# Patient Record
Sex: Female | Born: 1940 | Race: White | Hispanic: No | Smoking: Former smoker
Health system: Southern US, Community
[De-identification: ages and names within clinical notes are randomized; demographics above are authoritative.]

## PROBLEM LIST (undated history)

## (undated) DIAGNOSIS — Z923 Personal history of irradiation: Secondary | ICD-10-CM

## (undated) DIAGNOSIS — D649 Anemia, unspecified: Secondary | ICD-10-CM

## (undated) DIAGNOSIS — H9190 Unspecified hearing loss, unspecified ear: Secondary | ICD-10-CM

## (undated) DIAGNOSIS — M1990 Unspecified osteoarthritis, unspecified site: Secondary | ICD-10-CM

## (undated) DIAGNOSIS — M8580 Other specified disorders of bone density and structure, unspecified site: Secondary | ICD-10-CM

## (undated) DIAGNOSIS — Z860100 Personal history of colon polyps, unspecified: Secondary | ICD-10-CM

## (undated) DIAGNOSIS — K219 Gastro-esophageal reflux disease without esophagitis: Secondary | ICD-10-CM

## (undated) DIAGNOSIS — C50919 Malignant neoplasm of unspecified site of unspecified female breast: Secondary | ICD-10-CM

## (undated) DIAGNOSIS — F32A Depression, unspecified: Secondary | ICD-10-CM

## (undated) DIAGNOSIS — F419 Anxiety disorder, unspecified: Secondary | ICD-10-CM

## (undated) DIAGNOSIS — M810 Age-related osteoporosis without current pathological fracture: Secondary | ICD-10-CM

## (undated) DIAGNOSIS — Z8701 Personal history of pneumonia (recurrent): Secondary | ICD-10-CM

## (undated) DIAGNOSIS — K5790 Diverticulosis of intestine, part unspecified, without perforation or abscess without bleeding: Secondary | ICD-10-CM

## (undated) DIAGNOSIS — J45909 Unspecified asthma, uncomplicated: Secondary | ICD-10-CM

## (undated) DIAGNOSIS — Z9289 Personal history of other medical treatment: Secondary | ICD-10-CM

## (undated) DIAGNOSIS — G8929 Other chronic pain: Secondary | ICD-10-CM

## (undated) DIAGNOSIS — Z8719 Personal history of other diseases of the digestive system: Secondary | ICD-10-CM

## (undated) DIAGNOSIS — I10 Essential (primary) hypertension: Secondary | ICD-10-CM

## (undated) DIAGNOSIS — R51 Headache: Secondary | ICD-10-CM

## (undated) DIAGNOSIS — F329 Major depressive disorder, single episode, unspecified: Secondary | ICD-10-CM

## (undated) DIAGNOSIS — G4700 Insomnia, unspecified: Secondary | ICD-10-CM

## (undated) DIAGNOSIS — M549 Dorsalgia, unspecified: Secondary | ICD-10-CM

## (undated) DIAGNOSIS — R519 Headache, unspecified: Secondary | ICD-10-CM

## (undated) DIAGNOSIS — R9389 Abnormal findings on diagnostic imaging of other specified body structures: Principal | ICD-10-CM

## (undated) HISTORY — DX: Other specified disorders of bone density and structure, unspecified site: M85.80

## (undated) HISTORY — PX: HIP FRACTURE SURGERY: SHX118

## (undated) HISTORY — DX: Depression, unspecified: F32.A

## (undated) HISTORY — DX: Essential (primary) hypertension: I10

## (undated) HISTORY — DX: Personal history of pneumonia (recurrent): Z87.01

## (undated) HISTORY — DX: Abnormal findings on diagnostic imaging of other specified body structures: R93.89

## (undated) HISTORY — DX: Anemia, unspecified: D64.9

## (undated) HISTORY — PX: COLONOSCOPY: SHX174

## (undated) HISTORY — DX: Major depressive disorder, single episode, unspecified: F32.9

## (undated) HISTORY — DX: Unspecified asthma, uncomplicated: J45.909

---

## 2001-10-31 ENCOUNTER — Encounter: Payer: Self-pay | Admitting: Cardiology

## 2001-10-31 ENCOUNTER — Ambulatory Visit (HOSPITAL_COMMUNITY): Admission: RE | Admit: 2001-10-31 | Discharge: 2001-10-31 | Payer: Self-pay | Admitting: Cardiology

## 2002-12-19 ENCOUNTER — Encounter: Payer: Self-pay | Admitting: Family Medicine

## 2002-12-19 ENCOUNTER — Ambulatory Visit (HOSPITAL_COMMUNITY): Admission: RE | Admit: 2002-12-19 | Discharge: 2002-12-19 | Payer: Self-pay | Admitting: Family Medicine

## 2004-02-28 ENCOUNTER — Ambulatory Visit (HOSPITAL_COMMUNITY): Admission: RE | Admit: 2004-02-28 | Discharge: 2004-02-28 | Payer: Self-pay | Admitting: Family Medicine

## 2005-03-02 ENCOUNTER — Ambulatory Visit (HOSPITAL_COMMUNITY): Admission: RE | Admit: 2005-03-02 | Discharge: 2005-03-02 | Payer: Self-pay | Admitting: Family Medicine

## 2005-11-09 ENCOUNTER — Ambulatory Visit (HOSPITAL_COMMUNITY): Admission: RE | Admit: 2005-11-09 | Discharge: 2005-11-09 | Payer: Self-pay | Admitting: Family Medicine

## 2006-06-17 ENCOUNTER — Ambulatory Visit: Payer: Self-pay | Admitting: Internal Medicine

## 2006-07-06 ENCOUNTER — Encounter (INDEPENDENT_AMBULATORY_CARE_PROVIDER_SITE_OTHER): Payer: Self-pay | Admitting: Specialist

## 2006-07-06 ENCOUNTER — Ambulatory Visit: Payer: Self-pay | Admitting: Internal Medicine

## 2006-07-06 ENCOUNTER — Ambulatory Visit (HOSPITAL_COMMUNITY): Admission: RE | Admit: 2006-07-06 | Discharge: 2006-07-06 | Payer: Self-pay | Admitting: Internal Medicine

## 2007-01-01 ENCOUNTER — Emergency Department (HOSPITAL_COMMUNITY): Admission: EM | Admit: 2007-01-01 | Discharge: 2007-01-02 | Payer: Self-pay | Admitting: Emergency Medicine

## 2007-01-05 ENCOUNTER — Encounter (HOSPITAL_COMMUNITY): Admission: RE | Admit: 2007-01-05 | Discharge: 2007-02-04 | Payer: Self-pay | Admitting: Internal Medicine

## 2007-06-07 ENCOUNTER — Ambulatory Visit (HOSPITAL_COMMUNITY): Admission: RE | Admit: 2007-06-07 | Discharge: 2007-06-07 | Payer: Self-pay | Admitting: Family Medicine

## 2008-06-08 ENCOUNTER — Encounter: Payer: Self-pay | Admitting: Emergency Medicine

## 2008-06-09 ENCOUNTER — Inpatient Hospital Stay (HOSPITAL_COMMUNITY): Admission: RE | Admit: 2008-06-09 | Discharge: 2008-06-14 | Payer: Self-pay | Admitting: Orthopedic Surgery

## 2008-06-12 ENCOUNTER — Encounter (INDEPENDENT_AMBULATORY_CARE_PROVIDER_SITE_OTHER): Payer: Self-pay | Admitting: Gastroenterology

## 2008-06-15 ENCOUNTER — Ambulatory Visit: Payer: Self-pay | Admitting: Orthopedic Surgery

## 2008-06-19 ENCOUNTER — Encounter: Admission: RE | Admit: 2008-06-19 | Discharge: 2008-06-19 | Payer: Self-pay | Admitting: Orthopedic Surgery

## 2008-07-17 ENCOUNTER — Encounter (HOSPITAL_COMMUNITY): Admission: RE | Admit: 2008-07-17 | Discharge: 2008-08-16 | Payer: Self-pay | Admitting: Orthopedic Surgery

## 2009-08-27 ENCOUNTER — Ambulatory Visit (HOSPITAL_COMMUNITY)
Admission: RE | Admit: 2009-08-27 | Discharge: 2009-08-27 | Payer: Self-pay | Source: Home / Self Care | Admitting: Cardiology

## 2009-09-27 ENCOUNTER — Ambulatory Visit (HOSPITAL_COMMUNITY): Admission: RE | Admit: 2009-09-27 | Discharge: 2009-09-27 | Payer: Self-pay | Admitting: Internal Medicine

## 2010-04-06 ENCOUNTER — Encounter: Payer: Self-pay | Admitting: Family Medicine

## 2010-06-25 LAB — PROTIME-INR
INR: 1.6 — ABNORMAL HIGH (ref 0.00–1.49)
Prothrombin Time: 20 seconds — ABNORMAL HIGH (ref 11.6–15.2)

## 2010-06-26 LAB — CBC
HCT: 22.2 % — ABNORMAL LOW (ref 36.0–46.0)
HCT: 22.4 % — ABNORMAL LOW (ref 36.0–46.0)
HCT: 26.2 % — ABNORMAL LOW (ref 36.0–46.0)
HCT: 30.1 % — ABNORMAL LOW (ref 36.0–46.0)
Hemoglobin: 11.1 g/dL — ABNORMAL LOW (ref 12.0–15.0)
Hemoglobin: 10.3 g/dL — ABNORMAL LOW (ref 12.0–15.0)
Hemoglobin: 7.6 g/dL — CL (ref 12.0–15.0)
Hemoglobin: 7.9 g/dL — CL (ref 12.0–15.0)
Hemoglobin: 9.2 g/dL — ABNORMAL LOW (ref 12.0–15.0)
MCHC: 35.2 g/dL (ref 30.0–36.0)
MCV: 92.6 fL (ref 78.0–100.0)
MCV: 93.9 fL (ref 78.0–100.0)
MCV: 95.2 fL (ref 78.0–100.0)
Platelets: 149 10*3/uL — ABNORMAL LOW (ref 150–400)
Platelets: 165 10*3/uL (ref 150–400)
RBC: 2.34 MIL/uL — ABNORMAL LOW (ref 3.87–5.11)
RBC: 2.41 MIL/uL — ABNORMAL LOW (ref 3.87–5.11)
RBC: 2.79 MIL/uL — ABNORMAL LOW (ref 3.87–5.11)
RDW: 12.6 % (ref 11.5–15.5)
RDW: 13.1 % (ref 11.5–15.5)
RDW: 14 % (ref 11.5–15.5)
WBC: 10.4 10*3/uL (ref 4.0–10.5)
WBC: 10 10*3/uL (ref 4.0–10.5)
WBC: 7.2 10*3/uL (ref 4.0–10.5)
WBC: 7.3 10*3/uL (ref 4.0–10.5)

## 2010-06-26 LAB — CROSSMATCH
ABO/RH(D): O POS
Antibody Screen: NEGATIVE

## 2010-06-26 LAB — DIFFERENTIAL
Basophils Absolute: 0 10*3/uL (ref 0.0–0.1)
Eosinophils Absolute: 0 10*3/uL (ref 0.0–0.7)
Lymphocytes Relative: 10 % — ABNORMAL LOW (ref 12–46)
Monocytes Absolute: 0.4 10*3/uL (ref 0.1–1.0)
Neutro Abs: 8.9 10*3/uL — ABNORMAL HIGH (ref 1.7–7.7)
Neutrophils Relative %: 86 % — ABNORMAL HIGH (ref 43–77)

## 2010-06-26 LAB — BASIC METABOLIC PANEL
CO2: 26 mEq/L (ref 19–32)
Calcium: 7.7 mg/dL — ABNORMAL LOW (ref 8.4–10.5)
Creatinine, Ser: 0.88 mg/dL (ref 0.4–1.2)
GFR calc Af Amer: >60 mL/min (ref >60)
GFR calc non Af Amer: >60 mL/min (ref >60)
Glucose, Bld: 87 mg/dL (ref 70–99)
Glucose, Bld: 87 mg/dL (ref 70–99)
Potassium: 3.7 mEq/L (ref 3.5–5.1)
Sodium: 134 mEq/L — ABNORMAL LOW (ref 135–145)

## 2010-06-26 LAB — URINALYSIS, ROUTINE W REFLEX MICROSCOPIC
Bilirubin Urine: NEGATIVE
Glucose, UA: NEGATIVE mg/dL
Hgb urine dipstick: NEGATIVE
Protein, ur: NEGATIVE mg/dL
Specific Gravity, Urine: <1.005 — ABNORMAL LOW (ref 1.005–1.030)
Urobilinogen, UA: 0.2 mg/dL (ref 0.0–1.0)

## 2010-06-26 LAB — PROTIME-INR
INR: 1.1 (ref 0.00–1.49)
INR: 1.3 (ref 0.00–1.49)
INR: 1.6 — ABNORMAL HIGH (ref 0.00–1.49)
Prothrombin Time: 14 seconds (ref 11.6–15.2)
Prothrombin Time: 19.8 seconds — ABNORMAL HIGH (ref 11.6–15.2)

## 2010-06-26 LAB — COMPREHENSIVE METABOLIC PANEL
ALT: 31 U/L (ref 0–35)
AST: 23 U/L (ref 0–37)
Alkaline Phosphatase: 65 U/L (ref 39–117)
CO2: 24 mEq/L (ref 19–32)
Calcium: 8.9 mg/dL (ref 8.4–10.5)
Chloride: 101 mEq/L (ref 96–112)
GFR calc Af Amer: >60 mL/min (ref >60)
Potassium: 3.5 mEq/L (ref 3.5–5.1)
Sodium: 135 mEq/L (ref 135–145)
Total Protein: 6.4 g/dL (ref 6.0–8.3)

## 2010-06-26 LAB — APTT: aPTT: 42 seconds — ABNORMAL HIGH (ref 24–37)

## 2010-07-29 NOTE — Op Note (Signed)
NAMEQIANNA, CLAGETT             ACCOUNT NO.:  [id number]   MEDICAL RECORD NO.:  [id number]          PATIENT TYPE:  INP   LOCATION:  5016                         FACILITY:  [facility]   PHYSICIAN:  Feliberto Gottron. Turner Daniels, M.D.   DATE OF BIRTH:  1940/06/09   DATE OF PROCEDURE:  06/09/2008  DATE OF DISCHARGE:                               OPERATIVE REPORT   PREOPERATIVE DIAGNOSIS:  Left hip femoral neck fracture.   POSTOPERATIVE DIAGNOSIS:  Left hip femoral neck fracture.   PROCEDURE:  Left hip hemiarthroplasty, cemented using a DePuy and #2  Summit basic stem, 11-mm tip, #4 cement restricter, and NK +0, 47-mm  monopolar head.   SURGEON:  Feliberto Gottron. Turner Daniels, MD   FIRST ASSISTANT:  Shirl Harris, PA.   ANESTHETIC:  General endotracheal.   ESTIMATED BLOOD LOSS:  200 mL.   FLUID REPLACEMENT:  1200 mL of crystalloid.   DRAINS PLACED:  None.   TOURNIQUET TIME:  None.   INDICATIONS FOR PROCEDURE:  A 70 year old woman resident of [facility], slipped and fell on June 08, 2008, and sustained a displaced  left femoral neck fracture, was seen at the [facility] emergency room  and orthopedic consultation was obtained over the phone.  We went ahead  and measured to the orthopedic floor overnight.  She got to [facility] around 01:30 in the morning.  I saw her at 05:00 hours and we  prepared her for hemi hip arthroplasty for her displaced left femoral  neck fracture.  She did have any significant other medical  comorbidities.  Risks and benefits of surgery discussed, questions  answered.   DESCRIPTION OF PROCEDURE:  The patient identified by armband and  received preoperative Ancef in the holding area at [facility].  She was then taken to operating room 5 where the appropriate anesthetic  monitors were attached and general endotracheal anesthesia was induced  with the patient in the supine position.  She was then rolled into the  right lateral decubitus position and fixed her  with a Rhina Brackett II  pelvic clamp by the way to the operating room before on operating room  5, and the left lower extremity was prepped and draped in usual sterile  fashion from the ankle to the hemipelvis.  Time-out procedure performed.  The skin along the lateral hip and thigh infiltrated with 10 mL of 0.5%  Marcaine and epinephrine solution and we began the procedure itself by  making a 12 cm incision over the greater trochanter put allowing a  posterolateral approach to the hip.  Small bleeders in the skin and  subcutaneous tissue were identified and cauterized.  The IT band was cut  in line with a skin incision exposing the greater trochanter.  Cobra  retractors were then placed between the gluteus minimus and the superior  hip joint capsule between the quadratus femoris and the inferior joint  capsule.  This isolated the short external rotators and piriformis,  which were then cut off at their insertion on the intertrochanteric  crest exposing the posterior aspect of hip joint capsule, which has then  developed into an acetabular based flap going from posterior-superior  out over the femoral neck and exiting posterior inferior.  The capsular  flap was likewise tagged with #2 Ethibond sutures and the fracture site  was exposed.  Using a power oscillating saw a standard neck cut was  performed one fingerbreadth above the lesser trochanter and then the  femoral head was removed with a corkscrew device and measured for 47 mm  trial, which was inserted and had an excellent fit and fill with good  motion.  At this time, the proximal femur was flexed and internally  rotated exposing the proximal femur itself.  We then entered with a  initiating reamer followed by canal sounder followed by a lateral  reamer.  We then broached up to a #2 broach, which had the appropriate  fit and fill and with the broach appropriately seated, used the calcar  reamer to flush cut on the calcar.  We then  measured for #4 cement  restricter, which was inserted to the appropriate depth for the #4  broach.  With the broach in place, we then performed a trial reduction  with an NK +0, 47 mm ball and obtained a good stable reduction.  The hip  could not be dislocated in external rotation and extension, and had  flexion with internal rotation required almost 80 of internal rotation  at 90 of flexion before instability was noted.  At this point, the trial  components were removed and the proximal femur was water-picked clean  and dried with suction and sponges.  A double batch of DePuy 1 cement  with 1500 mg of Zinacef was then mixed and injected into the canal under  pressure and at the 5-minute mark, the #2 Summit basic stem with an 11-  mm tip was inserted and a good compression was noted.  The excess cement  was removed and after the cement had cured, an NK +0, 47-mm monopolar  ball was hammered to the stem and the hip reduced.  The hip was taken  through a range of motion.  Excellent stability was noted.  Leg lengths  were felt to be within a few millimeters of the contralateral side and  the wound was irrigated out with normal saline solution at this point.  The short external rotators and capsular flap repaired back to the  intertrochanteric crest through drill holes using a #2 Ethibond suture.  The IT band was closed with running #1 Vicryl suture.  The subcutaneous  tissue with 2-0 undyed Vicryl suture and the skin with running  interlocking 3-0 nylon suture.  A dressing of Xeroform and apoplexy was  then applied.  The patient was unclamped, rolled supine, awakened and  taken to the recovery room without difficulty.      Feliberto Gottron. Turner Daniels, M.D.  Electronically Signed     FJR/MEDQ  D:  06/09/2008  T:  06/10/2008  Job:  [id number]

## 2010-07-29 NOTE — Discharge Summary (Signed)
NAMEMARSHA, Marilyn Haas             ACCOUNT NO.:  [id number]   MEDICAL RECORD NO.:  [id number]          PATIENT TYPE:  INP   LOCATION:  5016                         FACILITY:  [facility]   PHYSICIAN:  Feliberto Gottron. Turner Daniels, M.D.   DATE OF BIRTH:  09-14-1940   DATE OF ADMISSION:  06/09/2008  DATE OF DISCHARGE:  06/14/2008                               DISCHARGE SUMMARY   CHIEF COMPLAINT:  Left hip pain.   HISTORY OF PRESENT ILLNESS:  This is a 70 year old lady who slipped in  the rain and fell injuring her left hip.  She presented to the [facility] Emergency Room.  X-rays were taken that showed a left femoral neck  fracture.  Dr. Gean Birchwood was consulted for orthopedic evaluation and  determined after reviewing the x-rays that she would need surgery.  All  risks and benefits of surgery were discussed with the patient.   PAST MEDICAL HISTORY:  Significant for hypertension and high  cholesterol.   FAMILY HISTORY:  Congestive heart failure and coronary artery disease.   SOCIAL HISTORY:  She denies use of tobacco or alcohol.   She is allergic to AVELOX.   CURRENT MEDICATIONS:  1. Avapro 150 mg 1 p.o. daily.  2. Vytorin 10/40 mg 1 p.o. daily.  3. Celexa 10 mg 1 p.o. daily.   PHYSICAL EXAMINATION:  Gross examination of the left hip demonstrates  the patient's leg to be shortened and externally rotated.  She is  neurovascularly intact.  There is no evidence of ecchymoses.  The skin  is intact.   X-rays demonstrate a fracture of the left femoral neck.   PREOPERATIVE LABORATORY DATA:  White blood cells 7.3, red blood cells  2.34, hemoglobin 7.6, hematocrit 22.2, and platelets 149.  PT 16.3 and  INR 1.3.  Sodium 133, potassium 3.5, chloride 102, glucose 87, BUN 8,  and creatinine 0.88.   HOSPITAL COURSE:  Ms. Osentoski was evaluated in the [facility] Emergency  Department on June 09, 2008.  X-rays demonstrated a fracture of the  left femoral neck, and the patient was taken to surgery when  she  underwent a left hip hemiarthroplasty.  The procedure was performed by  Dr. Gean Birchwood, and the patient tolerated it well.  A perioperative  Foley catheter was placed.  She was transferred to the orthopedic floor  and placed on Lovenox and Coumadin for DVT prophylaxis.  On the first  postoperative day, the patient was awake and alert.  She denied any  nausea or vomiting and was tolerating p.o. intake well.  She complained  of dizziness with position changes.  She also complained of fatigue.  Hemoglobin was 7.6.  She was transfused with 2 units of packed red blood  cells.  On the second postoperative day, the patient was awake and  alert.  She reported improvement in her hip pain and her dizziness.  Hemoglobin was 9.2.  Her surgical dressing was changed, and her Foley  catheter was removed.  The patient began complaining of increased  dysphagia, so a Gastroenterology consult was called.  On the third  postoperative day, the  patient was awake and alert, but had difficulty  tolerating p.o. intake.  Hemoglobin was 7.6 and she was transfused 2  more units of packed red blood cells and Gastroenterology evaluated her  and determined that she would need an EGD with dilation.  The patient  tolerated that procedure well.  On the fourth postoperative day, the  patient was awake and alert.  She reported improvement in her nausea and  was able to tolerate p.o. intake much better after her procedure with  Gastroenterology.  She reported that her hip pain was well controlled.  Hemoglobin was 10.3, and her surgical dressing was clean.  On the fifth  postoperative day, the patient was awake, alert, and tolerating p.o.  intake.  She was progressing well with physical therapy and her pain was  well controlled with Vicodin, so she was discharged home.   DISPOSITION:  The patient was discharged home on June 14, 2008.  [facility] would manage her wound, Coumadin, and physical  therapy.   Discharge medicines were as per the HMR with the addition of  Vicodin 5/500 mg 1-2 tablets p.o. q.4 h p.r.n. pain and Coumadin to take  as directed with a target INR of 1.5 to 2.  She will follow up in the  clinic in 7-10 days for x-rays and suture removal.   FINAL DIAGNOSIS:  Left hip femoral neck fracture with secondary  diagnosis of acute blood loss anemia.      Shirl Harris, PA      Feliberto Gottron. Turner Daniels, M.D.  Electronically Signed    JW/MEDQ  D:  07/16/2008  T:  07/17/2008  Job:  [id number]

## 2010-07-29 NOTE — Consult Note (Signed)
NAMEJIL, PENLAND             ACCOUNT NO.:  [id number]   MEDICAL RECORD NO.:  [id number]          PATIENT TYPE:  INP   LOCATION:  5016                         FACILITY:  [facility]   PHYSICIAN:  Danise Edge, M.D.   DATE OF BIRTH:  05/28/1940   DATE OF CONSULTATION:  06/12/2008  DATE OF DISCHARGE:                                 CONSULTATION   REFERRING PHYSICIAN:  Feliberto Gottron. Turner Daniels, MD   We were asked to see Ms. Smoak today in consultation for dysphagia by  Dr. Turner Daniels.   HISTORY OF PRESENT ILLNESS:  This is a very pleasant 70 year old female  with a left hip fracture, who status post repair on June 09, 2008.  She  reports significant dysphagia to meats, rice, pills, and sometimes even  liquids.  She reports this problem has been chronic for years and has  been getting progressively worse.  She was scheduled to be dilated by  Dr. Karilyn Cota, her gastroenterologist in [place], but put it off until  after Christmas.  She denies abdominal pain, heartburn, or other upper  tract GI symptoms.  She frequently brings up food by inducing gagging or  vomiting with her hands.  She does have a history of chronic anemia, and  was placed on Repliva until her iron level improved.  However, she also  could not swallow the pills.   PAST MEDICAL HISTORY:  Significant for:  1. Hypertension.  2. Anxiety and depression.  3. History of dysphagia, status post dilation with improved results      approximately 20 years ago.  4. Hyperlipidemia.  5. Chronic low back pain.  6. Insomnia.  7. Iron-deficiency anemia.  8. Intermittent diarrhea.  9. IBS.   She had a colonoscopy done in April 2008, by Dr. Karilyn Cota.  She had a 6-mm  sigmoid polyps removed.   CURRENT MEDICATIONS AT HOME:  Avapro, Vytorin, and Celexa.   In the hospital, she has also been given Colace, senna, Coumadin, and  Lovenox as well as Protonix.   ALLERGIES:  She has no known drug allergies.   REVIEW OF SYSTEMS:  Negative except per  HPI.   SOCIAL HISTORY:  She has consumed some alcohol in the past and smoked  some in the past.  Does not currently do either of those.   FAMILY HISTORY:  Negative for GI cancers.   PHYSICAL EXAMINATION:  GENERAL:  She is alert and oriented, in no  apparent distress.  She is very pleasant to speak with.  VITAL SIGNS:  Temperature 99.8, pulse 88, respirations 16, and blood  pressure is 93/50.  HEART:  Regular rate and rhythm.  ABDOMEN:  Soft, nontender, and nondistended with good bowel sounds.   LABORATORIES:  Hemoglobin today of 7.9, hematocrit 22.4, white count  8.9, and platelets 140,000.  Her hemoglobin on admission was 11.1 and it  dropped to 7.6 after surgery.  She was given 2 units of blood.  Her  hemoglobin rose to 9.2 yesterday and today has fallen to 7.9 that could  be equilibration, it could be some type of bleeding.  We will have to  follow  her clinically.  Her PT today is 19.1 and INR 1.5.  BUN 8,  creatinine 0.60, and glucose 87.   ASSESSMENT:  Dr. Danise Edge has seen and examined the patient,  collected a history, and reviewed her chart.  His impression is this is  a 70 year old female with a long history of dysphagia, now worse after  hip surgery.  This is not uncommon.  She also has chronic anemia as well  as acute blood loss anemia.  She is status post hip fracture repair on  June 09, 2008.   PLAN:  As the patient's INR is 1.5 and she has not received any Lovenox  or Coumadin today, we will offer to proceed with upper endoscopy with  dilation today and see how she does.  As far as her anemia goes, we will  check her stools and follow her hemoglobin as well as clinical symptoms  for any signs of bleeding.   Thank you very much for this consultation.      Stephani Police, PA    ______________________________  Danise Edge, M.D.    MLY/MEDQ  D:  06/12/2008  T:  06/13/2008  Job:  [id number]   cc:   Lionel December, M.D.  Danise Edge, M.D.

## 2010-07-29 NOTE — Op Note (Signed)
[name], [name]             ACCOUNT NO.:  [id number]   MEDICAL RECORD NO.:  [id number]           PATIENT TYPE:   LOCATION:                                 FACILITY:   PHYSICIAN:  Danise Edge, M.D.   DATE OF BIRTH:  19-Apr-1940   DATE OF PROCEDURE:  06/12/2008  DATE OF DISCHARGE:                               OPERATIVE REPORT   Esophagogastroduodenoscopy with esophageal balloon dilation of a distal  esophageal stricture.   PROCEDURE INDICATION:  Ms. Marilyn Haas is a 70 year old female born  on [birth year].  Ms. Rubinstein recently underwent left hip  hemiarthroplasty and is on Coumadin and Lovenox.  Her prothrombin time  INR is 1.5 today.  She has not received her dose of Lovenox for today.   The patient is experiencing esophageal dysphagia and is having  difficulty swallowing her medication.   ENDOSCOPIST:  Danise Edge, MD   PREMEDICATION:  1. Fentanyl 100 mcg.  2. Versed 10 mg.   PROCEDURE:  After obtaining informed consent, [name] was placed in  the left lateral decubitus position.  I administered intravenous  fentanyl and intravenous Versed to achieve conscious sedation for the  procedure.  The patient's blood pressure, oxygen saturation, and cardiac  rhythm were monitored throughout the procedure and documented in the  medical record.   The Pentax gastroscope was passed through the posterior hypopharynx into  the proximal esophagus without difficulty.  The hypopharynx and larynx  appeared normal.  I did not visualize the vocal cords.   ESOPHAGOSCOPY:  The proximal, mid, and lower segments of the esophageal  mucosa appeared normal except for a benign-appearing stricture at the  esophagogastric junction which is noted at approximately 40 cm from the  incisor teeth.  There is no endoscopic evidence for the presence of  erosive esophagitis or Barrett esophagus.  Using the CRE esophageal  balloon dilators, the stricture at the esophagogastric junction  was  dilated from 10 mm to 15 mm without apparent complications.  Two  biopsies were then taken at the esophagogastric junction to evaluate the  stricture.   GASTROSCOPY:  There is a moderate-sized hiatal hernia.  Retroflex view  of the gastric cardia and fundus was normal.  The gastric body, antrum,  and pylorus appeared normal.   DUODENOSCOPY:  The duodenal bulb and descending duodenum appeared  normal.   ASSESSMENT:  1. 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  Esophageal stricture biopsy was      performed.  2. Otherwise, normal esophagogastroduodenoscopy.   RECOMMENDATIONS:  Start proton pump inhibitor therapy each morning.  Resume Coumadin tomorrow.  Resume Lovenox today.           ______________________________  Danise Edge, M.D.     MJ/MEDQ  D:  06/12/2008  T:  06/13/2008  Job:  [id number]   cc:   Lionel December, M.D.

## 2010-08-01 NOTE — Procedures (Signed)
   NAME:  ANNSLEY, AKKERMAN                       ACCOUNT NO.:  [id number]   MEDICAL RECORD NO.:  [id number]                   PATIENT TYPE:  OUT   LOCATION:  RAD                                  FACILITY:  [facility]   PHYSICIAN:  Thomas C. Wall, M.D. [facility]            DATE OF BIRTH:  [birth year]   DATE OF PROCEDURE:  10/31/2001  DATE OF DISCHARGE:  10/31/2001                                    STRESS TEST   PROCEDURE:  Adenosine Cardiolite study.   BRIEF HISTORY:  This patient is a 70 year old female who was seen in our  office on 10/14/01 by Dr. Daleen Squibb in regards to tachy-palpitations associated  with bilateral arm heaviness.  Several years ago she was evaluated for  similar symptoms.  A Holter showed frequent PVCs and infrequent PACs, normal  2-D echocardiogram and a normal TSH.  She states that since she has  eliminated her caffeine and being on a beta blocker her palpitations are  much better.  Dr. Daleen Squibb referred for an Adenosine Cardiolite.   DESCRIPTION OF PROCEDURE:  Baseline EKG showed normal sinus rhythm,  nonspecific ST-T wave changes.  Resting blood pressure was 142/84.  Utilizing 35.8 mg of IV adenosine, this was infused over 4 minutes.  During  infusion she stated she felt funny and slightly short of breath.  These  symptoms resolved post infusion.  IV Cardiolite was administered at 3  minutes. She did not have any EKG changes during the test.   Final results of the imaging are pending Dr. Vern Claude review.     Graciella Freer Daleen Squibb, M.D. [facility]    EW/MEDQ  D:  10/31/2001  T:  11/02/2001  Job:  [id number]

## 2010-08-19 ENCOUNTER — Other Ambulatory Visit (HOSPITAL_COMMUNITY): Payer: Self-pay | Admitting: Family Medicine

## 2010-08-19 DIAGNOSIS — Z139 Encounter for screening, unspecified: Secondary | ICD-10-CM

## 2010-08-22 ENCOUNTER — Other Ambulatory Visit (HOSPITAL_COMMUNITY): Payer: Self-pay

## 2010-08-22 ENCOUNTER — Ambulatory Visit (HOSPITAL_COMMUNITY): Payer: Self-pay

## 2010-08-29 ENCOUNTER — Ambulatory Visit (HOSPITAL_COMMUNITY)
Admission: RE | Admit: 2010-08-29 | Discharge: 2010-08-29 | Disposition: A | Discharge status: Home / Self Care | Payer: Medicare Other | Source: Ambulatory Visit | Attending: Family Medicine | Admitting: Family Medicine

## 2010-08-29 DIAGNOSIS — Z139 Encounter for screening, unspecified: Secondary | ICD-10-CM

## 2010-08-29 DIAGNOSIS — Z780 Asymptomatic menopausal state: Secondary | ICD-10-CM | POA: Insufficient documentation

## 2010-08-29 DIAGNOSIS — M899 Disorder of bone, unspecified: Secondary | ICD-10-CM | POA: Insufficient documentation

## 2010-08-29 DIAGNOSIS — Z1231 Encounter for screening mammogram for malignant neoplasm of breast: Secondary | ICD-10-CM | POA: Insufficient documentation

## 2010-08-29 DIAGNOSIS — M949 Disorder of cartilage, unspecified: Secondary | ICD-10-CM | POA: Insufficient documentation

## 2010-12-24 LAB — BASIC METABOLIC PANEL
BUN: 8
Creatinine, Ser: 0.66
GFR calc non Af Amer: >60
Potassium: 3.3 — ABNORMAL LOW

## 2010-12-24 LAB — DIFFERENTIAL
Lymphocytes Relative: 19
Lymphs Abs: 1.9
Neutrophils Relative %: 73

## 2010-12-24 LAB — CBC
HCT: 32.4 — ABNORMAL LOW
Platelets: 251
WBC: 10.1

## 2010-12-24 LAB — POCT CARDIAC MARKERS
Myoglobin, poc: 116
Operator id: 216221
Troponin i, poc: <0.05

## 2011-05-04 DIAGNOSIS — G8929 Other chronic pain: Secondary | ICD-10-CM | POA: Diagnosis not present

## 2011-05-04 DIAGNOSIS — IMO0002 Reserved for concepts with insufficient information to code with codable children: Secondary | ICD-10-CM | POA: Diagnosis not present

## 2011-05-04 DIAGNOSIS — F411 Generalized anxiety disorder: Secondary | ICD-10-CM | POA: Diagnosis not present

## 2011-05-04 DIAGNOSIS — R1310 Dysphagia, unspecified: Secondary | ICD-10-CM | POA: Diagnosis not present

## 2011-05-06 ENCOUNTER — Encounter (INDEPENDENT_AMBULATORY_CARE_PROVIDER_SITE_OTHER): Payer: Self-pay | Admitting: *Deleted

## 2011-05-25 ENCOUNTER — Other Ambulatory Visit (INDEPENDENT_AMBULATORY_CARE_PROVIDER_SITE_OTHER): Payer: Self-pay | Admitting: *Deleted

## 2011-05-25 ENCOUNTER — Ambulatory Visit (INDEPENDENT_AMBULATORY_CARE_PROVIDER_SITE_OTHER): Payer: Medicare Other | Admitting: Internal Medicine

## 2011-05-25 ENCOUNTER — Encounter (INDEPENDENT_AMBULATORY_CARE_PROVIDER_SITE_OTHER): Payer: Self-pay | Admitting: Internal Medicine

## 2011-05-25 ENCOUNTER — Encounter (INDEPENDENT_AMBULATORY_CARE_PROVIDER_SITE_OTHER): Payer: Self-pay | Admitting: *Deleted

## 2011-05-25 DIAGNOSIS — R1310 Dysphagia, unspecified: Secondary | ICD-10-CM | POA: Diagnosis not present

## 2011-05-25 DIAGNOSIS — E785 Hyperlipidemia, unspecified: Secondary | ICD-10-CM | POA: Insufficient documentation

## 2011-05-25 DIAGNOSIS — I10 Essential (primary) hypertension: Secondary | ICD-10-CM | POA: Insufficient documentation

## 2011-05-25 NOTE — Progress Notes (Signed)
Subjective:     Patient ID: Marilyn Haas, female   DOB: 07/19/1940, 71 y.o.   MRN: [id number]  HPI Marilyn Haas is a 71 yr old female presenting today with c/o dysphagia. She has had dysphagia for a couple of years. Dysphagia is occuring on a daily basis. She will have to vomit the bolus back up.  Appetite is good. No weight loss. She says she has excessive gas.  She has a BM about eery 3 days.  Her last EGD/ED was 06/12/2008: Benign appearing peptic stricture at the esophagogastric junction.  Review of Systems see hpi Current Outpatient Prescriptions  Medication Sig Dispense Refill  . ALPRAZolam (XANAX) 0.25 MG tablet Take 0.25 mg by mouth at bedtime as needed.      Marland Kitchen aspirin 81 MG tablet Take 81 mg by mouth daily.      . citalopram (CELEXA) 10 MG tablet Take 20 mg by mouth daily.      . simvastatin (ZOCOR) 10 MG tablet Take by mouth at bedtime.       Past Medical History  Diagnosis Date  . Hypertension   . Dysphagia   . Hiatal hernia   . Anemia   . Hyperlipidemia   . Colonic polyp     adenomatous poly. No high grade dysplasia or invasive malignancy. 07/06/2006   Past Surgical History  Procedure Date  . Hip fracture surgery     05/2009   History   Social History  . Marital Status: Married    Spouse Name: N/A    Number of Children: N/A  . Years of Education: N/A   Occupational History  . Not on file.   Social History Main Topics  . Smoking status: Never Smoker   . Smokeless tobacco: Not on file  . Alcohol Use: No  . Drug Use: No  . Sexually Active: Not on file   Other Topics Concern  . Not on file   Social History Narrative  . No narrative on file   Family Status  Relation Status Death Age  . Mother Deceased     cad  . Father Deceased     AAA  . Sister Alive     One has had an MI. One in fair health    Allergies  Allergen Reactions  . Avelox (Moxifloxacin Hcl In Nacl)        Objective:   Physical Exam Filed Vitals:   05/25/11 1504  Height: 5\' 5"   (1.651 m)  Weight: 127 lb 4.8 oz (57.743 kg)   Alert and oriented. Skin warm and dry. Oral mucosa is moist.   . Sclera anicteric, conjunctivae is pink. Thyroid not enlarged. No cervical lymphadenopathy. Lungs clear. Heart regular rate and rhythm.  Abdomen is soft. Bowel sounds are positive. No hepatomegaly. No abdominal masses felt. No tenderness.  No edema to lower extremities. Patient is alert and oriented.      Assessment:   Solid food dysphagia.    Plan:   EGD/ED with Dr. Karilyn Cota. The risks and benefits such as perforation, bleeding, and infection were reviewed with the patient and is agreeable.

## 2011-05-25 NOTE — Patient Instructions (Addendum)
EGD/ED with Dr Drake Leach Swallowing problems (dysphagia) occur when solids and liquids seem to stick in your throat on the way down to your stomach, or the food takes longer to get to the stomach. Other symptoms (problems) include regurgitating (burping) up food, noises coming from the throat, chest discomfort with swallowing, and a feeling of fullness in the throat when swallowing. When blockage in the throat is complete it may be associated with drooling. CAUSES There are many causes of swallowing difficulties and the following is generalized information regarding a number of reasons for this problem. Problems with swallowing may occur because of problems with the muscles. The food cannot be propelled in the usual manner into the stomach. There may be ulcers, scar tissueor inflammation (soreness) in the esophagus (the food tube from the mouth to the stomach) which blocks food from passing normally into the stomach. Causes of inflammation include acid reflux from the stomach into the esophagus. Inflammation can also be caused by the herpes simplex virus, Candida (yeast), radiation (as with treatment of cancer), or inflammation from medications not taken with adequate fluids to wash them down into the stomach. There may be nerve problems so signals cannot be sent adequately telling the muscles of the esophagus to contract and move the food along. Achalasia is a rare disorder of the esophagus in which muscular contractions of the esophagus are uncoordinated. Globus hystericus is a relatively common problem in young females in which there is a sense of an obstruction or difficulty in swallowing, but in which no abnormalities can be found. This problem usually improves over time with reassurance and testing to rule out other causes. DIAGNOSIS A number of tests will help your caregiver know what is the cause of your swallowing problems. These tests may include a barium swallow in which x-rays are taken while  you are drinking a liquid that outlines the lining of the esophagus on x-ray. If the stomach and small bowel are also studied in this manner it is called an upper gastrointestinal exam (UGI). Endoscopy may be done in which your caregiver examines your throat, esophagus, stomach and small bowel with an instrument like a small flexible telescope. Motility studies which measure the effectiveness and coordination of the muscular contractions of the esophagus may also be done. TREATMENT The treatment of swallowing problems are many, varying from medications to surgical treatment. The treatment varies with the type of problem found. Your caregiver will discuss your results and treatment with you. If swallowing problems are severe the long term problems which may occur include: malnutrition, pneumonia (from food going into the breathing tubes called trachea and bronchi), and an increase in tumors (lumps) of the esophagus. SEEK IMMEDIATE MEDICAL CARE IF:  Food or other object becomes lodged in your throat or esophagus and won't move.  Document Released: 02/28/2000 Document Revised: 02/19/2011 Document Reviewed: 10/19/2007 [facility] Patient Information 2012 [place], [place].

## 2011-05-26 ENCOUNTER — Telehealth (INDEPENDENT_AMBULATORY_CARE_PROVIDER_SITE_OTHER): Payer: Self-pay | Admitting: *Deleted

## 2011-05-26 NOTE — Telephone Encounter (Addendum)
Patient had hip replacement in 2011, I spoke to Dr Karilyn Cota and patient will need RX for amoxicillin 500 mg 6 tabs 2 hrs prior to procedure called to pharmacy -- she is scheduled for 05/28/11   Rx called in to [facility].

## 2011-05-27 ENCOUNTER — Encounter (HOSPITAL_COMMUNITY): Payer: Self-pay | Admitting: Pharmacy Technician

## 2011-05-27 MED ORDER — SODIUM CHLORIDE 0.45 % IV SOLN
Freq: Once | INTRAVENOUS | Status: AC
  Administered 2011-05-28: 14:00:00 via INTRAVENOUS

## 2011-05-28 ENCOUNTER — Encounter (HOSPITAL_COMMUNITY)
Admission: RE | Disposition: A | Discharge status: Home / Self Care | Payer: Self-pay | Source: Ambulatory Visit | Attending: Internal Medicine

## 2011-05-28 ENCOUNTER — Encounter (HOSPITAL_COMMUNITY): Payer: Self-pay | Admitting: *Deleted

## 2011-05-28 ENCOUNTER — Ambulatory Visit (HOSPITAL_COMMUNITY)
Admission: RE | Admit: 2011-05-28 | Discharge: 2011-05-28 | Disposition: A | Discharge status: Home / Self Care | Payer: Medicare Other | Source: Ambulatory Visit | Attending: Internal Medicine | Admitting: Internal Medicine

## 2011-05-28 DIAGNOSIS — R1310 Dysphagia, unspecified: Secondary | ICD-10-CM | POA: Insufficient documentation

## 2011-05-28 DIAGNOSIS — E785 Hyperlipidemia, unspecified: Secondary | ICD-10-CM | POA: Diagnosis not present

## 2011-05-28 DIAGNOSIS — K222 Esophageal obstruction: Secondary | ICD-10-CM | POA: Diagnosis not present

## 2011-05-28 DIAGNOSIS — I10 Essential (primary) hypertension: Secondary | ICD-10-CM | POA: Diagnosis not present

## 2011-05-28 DIAGNOSIS — Z8719 Personal history of other diseases of the digestive system: Secondary | ICD-10-CM | POA: Diagnosis not present

## 2011-05-28 DIAGNOSIS — Z79899 Other long term (current) drug therapy: Secondary | ICD-10-CM | POA: Insufficient documentation

## 2011-05-28 DIAGNOSIS — K449 Diaphragmatic hernia without obstruction or gangrene: Secondary | ICD-10-CM | POA: Diagnosis not present

## 2011-05-28 SURGERY — ESOPHAGOGASTRODUODENOSCOPY (EGD) WITH ESOPHAGEAL DILATION
Anesthesia: Moderate Sedation

## 2011-05-28 MED ORDER — PANTOPRAZOLE SODIUM 40 MG PO TBEC: 40.0000 mg | DELAYED_RELEASE_TABLET | Freq: Every day | ORAL | Status: DC

## 2011-05-28 MED ORDER — MIDAZOLAM HCL 5 MG/5ML IJ SOLN
INTRAMUSCULAR | Status: AC
  Filled 2011-05-28: qty 10

## 2011-05-28 MED ORDER — STERILE WATER FOR IRRIGATION IR SOLN
Status: DC | PRN
  Administered 2011-05-28: 14:00:00

## 2011-05-28 MED ORDER — MEPERIDINE HCL 50 MG/ML IJ SOLN
INTRAMUSCULAR | Status: AC
  Filled 2011-05-28: qty 1

## 2011-05-28 MED ORDER — MEPERIDINE HCL 25 MG/ML IJ SOLN
INTRAMUSCULAR | Status: DC | PRN
  Administered 2011-05-28 (×2): 25 mg via INTRAVENOUS

## 2011-05-28 MED ORDER — BUTAMBEN-TETRACAINE-BENZOCAINE 2-2-14 % EX AERO
INHALATION_SPRAY | CUTANEOUS | Status: DC | PRN
  Administered 2011-05-28: 2 via TOPICAL

## 2011-05-28 MED ORDER — MIDAZOLAM HCL 5 MG/5ML IJ SOLN
INTRAMUSCULAR | Status: DC | PRN
  Administered 2011-05-28 (×2): 2 mg via INTRAVENOUS
  Administered 2011-05-28: 1 mg via INTRAVENOUS

## 2011-05-28 NOTE — H&P (Signed)
This is an update to history and physical from 05/25/2011. There's been no interval change in patient's symptoms or history. She took 3 g of amoxicillin over 2 hours ago for prophylaxis. She is undergoing EGD and ED.

## 2011-05-28 NOTE — Op Note (Signed)
EGD PROCEDURE REPORT  PATIENT:  Marilyn Haas  MR#:  [id number] Birthdate:  1940-04-27, 70 y.o., female Endoscopist:  Dr. Malissa Hippo, MD Referred By:  Dr. Kirk Ruths, MD Procedure Date: 05/28/2011  Procedure:   EGD with ED.  Indications:  Patient is a 71 year old Caucasian female who presents with dysphagia to solids. She is having the symptoms every day and has had multiple episodes of regurgitation currently. She has undergone few dilations in the past. Most recently was in March 2010 45 surgery 1 she was hospitalized and developed what sounds like a pill impaction. The stricture was dilated to 15 mm then.           Informed Consent:  Procedure and risks were reviewed with the patient and informed consent was obtained.  Medications:  Demerol 50 mg IV Versed 5 mg IV Cetacaine spray topically for oropharyngeal anesthesia  Description of procedure:  The endoscope was introduced through the mouth and advanced to the second portion of the duodenum without difficulty or limitations. The mucosal surfaces were surveyed very carefully during advancement of the scope and upon withdrawal.  Findings:  Esophagus:  Mucosa of the esophagus was normal. Very localized stricture noted at GE junction; resistance noted as the scope was passed across it. GEJ:  33 cm Hiatus:  36 cm Stomach:  Stomach was empty and distended very well with insufflation. Folds in the proximal stomach were normal. Examination of mucosa at  Body, antrum, pyloric channel as well as angularis, fundus and cardia was normal. Duodenum:  Normal bulbar and post bulbar mucosa.  Therapeutic/Diagnostic Maneuvers Performed:  Esophageal stricture was dilated with a balloon from 15 to 16.5 and 18 mm. Past the balloon was deflated and withdrawn endoscope was passed across GE junction without any resistance. Multiple pictures taken for the record. Patient tolerated the procedure well.  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 This stricture was dilated to 18 mm with a balloon. Small sliding hiatal hernia.  Recommendations:  Anti-reflux measures. Pantoprazole 40 mg by mouth every morning. Office visit in one year  Kamaury Cutbirth U  05/28/2011  2:37 PM  CC: Dr. Kirk Ruths, MD, MD & Dr. Bonnetta Barry ref. provider found

## 2011-05-28 NOTE — Discharge Instructions (Signed)
Resume aspirin on 05/31/2011. Resume medications as before. Anti-reflux measures. Pantoprazole 40 mg by mouth 30 minutes before breakfast daily. Notify if swallowing difficulty recurs. Office visit in one year  Endoscopy Care After Please read the instructions outlined below and refer to this sheet in the next few weeks. These discharge instructions provide you with general information on caring for yourself after you leave the hospital. Your doctor may also give you specific instructions. While your treatment has been planned according to the most current medical practices available, unavoidable complications occasionally occur. If you have any problems or questions after discharge, please call your doctor. HOME CARE INSTRUCTIONS Activity  You may resume your regular activity but move at a slower pace for the next 24 hours.   Take frequent rest periods for the next 24 hours.   Walking will help expel (get rid of) the air and reduce the bloated feeling in your abdomen.   No driving for 24 hours (because of the anesthesia (medicine) used during the test).   You may shower.   Do not sign any important legal documents or operate any machinery for 24 hours (because of the anesthesia used during the test).  Nutrition  Drink plenty of fluids.   You may resume your normal diet.   Begin with a light meal and progress to your normal diet.   Avoid alcoholic beverages for 24 hours or as instructed by your caregiver.  Medications You may resume your normal medications unless your caregiver tells you otherwise. What you can expect today  You may experience abdominal discomfort such as a feeling of fullness or "gas" pains.   You may experience a sore throat for 2 to 3 days. This is normal. Gargling with salt water may help this.  Follow-up Your doctor will discuss the results of your test with you. SEEK IMMEDIATE MEDICAL CARE IF:  You have excessive nausea (feeling sick to your stomach)  and/or vomiting.   You have severe abdominal pain and distention (swelling).   You have trouble swallowing.   You have a temperature over 100 F (37.8 C).   You have rectal bleeding or vomiting of blood.  Document Released: 10/15/2003 Document Revised: 02/19/2011 Document Reviewed: 04/27/2007 [facility] Patient Information 2012 [place], [place].

## 2011-06-25 DIAGNOSIS — R1310 Dysphagia, unspecified: Secondary | ICD-10-CM | POA: Diagnosis not present

## 2011-06-25 DIAGNOSIS — D649 Anemia, unspecified: Secondary | ICD-10-CM | POA: Diagnosis not present

## 2011-06-25 DIAGNOSIS — D539 Nutritional anemia, unspecified: Secondary | ICD-10-CM | POA: Diagnosis not present

## 2011-06-25 DIAGNOSIS — Z79899 Other long term (current) drug therapy: Secondary | ICD-10-CM | POA: Diagnosis not present

## 2011-06-25 DIAGNOSIS — F411 Generalized anxiety disorder: Secondary | ICD-10-CM | POA: Diagnosis not present

## 2011-06-25 DIAGNOSIS — G8929 Other chronic pain: Secondary | ICD-10-CM | POA: Diagnosis not present

## 2011-07-16 DIAGNOSIS — I10 Essential (primary) hypertension: Secondary | ICD-10-CM | POA: Diagnosis not present

## 2011-07-16 DIAGNOSIS — E782 Mixed hyperlipidemia: Secondary | ICD-10-CM | POA: Diagnosis not present

## 2011-07-16 DIAGNOSIS — F411 Generalized anxiety disorder: Secondary | ICD-10-CM | POA: Diagnosis not present

## 2011-09-08 DIAGNOSIS — M25559 Pain in unspecified hip: Secondary | ICD-10-CM | POA: Diagnosis not present

## 2012-01-07 DIAGNOSIS — G8929 Other chronic pain: Secondary | ICD-10-CM | POA: Diagnosis not present

## 2012-01-07 DIAGNOSIS — R5383 Other fatigue: Secondary | ICD-10-CM | POA: Diagnosis not present

## 2012-01-07 DIAGNOSIS — D518 Other vitamin B12 deficiency anemias: Secondary | ICD-10-CM | POA: Diagnosis not present

## 2012-01-07 DIAGNOSIS — Z23 Encounter for immunization: Secondary | ICD-10-CM | POA: Diagnosis not present

## 2012-01-07 DIAGNOSIS — M810 Age-related osteoporosis without current pathological fracture: Secondary | ICD-10-CM | POA: Diagnosis not present

## 2012-01-07 DIAGNOSIS — Z79899 Other long term (current) drug therapy: Secondary | ICD-10-CM | POA: Diagnosis not present

## 2012-01-07 DIAGNOSIS — IMO0002 Reserved for concepts with insufficient information to code with codable children: Secondary | ICD-10-CM | POA: Diagnosis not present

## 2012-01-07 DIAGNOSIS — R5381 Other malaise: Secondary | ICD-10-CM | POA: Diagnosis not present

## 2012-01-21 DIAGNOSIS — D518 Other vitamin B12 deficiency anemias: Secondary | ICD-10-CM | POA: Diagnosis not present

## 2012-01-26 DIAGNOSIS — E782 Mixed hyperlipidemia: Secondary | ICD-10-CM | POA: Diagnosis not present

## 2012-01-26 DIAGNOSIS — F329 Major depressive disorder, single episode, unspecified: Secondary | ICD-10-CM | POA: Diagnosis not present

## 2012-06-24 ENCOUNTER — Other Ambulatory Visit (HOSPITAL_COMMUNITY): Payer: Self-pay | Admitting: Internal Medicine

## 2012-07-21 ENCOUNTER — Encounter: Payer: Self-pay | Admitting: Internal Medicine

## 2012-08-09 ENCOUNTER — Encounter: Payer: Self-pay | Admitting: Internal Medicine

## 2012-08-09 ENCOUNTER — Ambulatory Visit (INDEPENDENT_AMBULATORY_CARE_PROVIDER_SITE_OTHER): Payer: Medicare Other | Admitting: Internal Medicine

## 2012-08-09 VITALS — BP 140/78 | HR 72 | Ht 65.0 in | Wt 128.0 lb

## 2012-08-09 DIAGNOSIS — F419 Anxiety disorder, unspecified: Secondary | ICD-10-CM | POA: Insufficient documentation

## 2012-08-09 DIAGNOSIS — R002 Palpitations: Secondary | ICD-10-CM | POA: Diagnosis not present

## 2012-08-09 DIAGNOSIS — F411 Generalized anxiety disorder: Secondary | ICD-10-CM

## 2012-08-09 DIAGNOSIS — D62 Acute posthemorrhagic anemia: Secondary | ICD-10-CM | POA: Insufficient documentation

## 2012-08-09 DIAGNOSIS — D519 Vitamin B12 deficiency anemia, unspecified: Secondary | ICD-10-CM | POA: Insufficient documentation

## 2012-08-09 DIAGNOSIS — E785 Hyperlipidemia, unspecified: Secondary | ICD-10-CM | POA: Diagnosis not present

## 2012-08-09 DIAGNOSIS — E559 Vitamin D deficiency, unspecified: Secondary | ICD-10-CM | POA: Diagnosis not present

## 2012-08-09 DIAGNOSIS — D518 Other vitamin B12 deficiency anemias: Secondary | ICD-10-CM | POA: Diagnosis not present

## 2012-08-09 DIAGNOSIS — D500 Iron deficiency anemia secondary to blood loss (chronic): Secondary | ICD-10-CM | POA: Insufficient documentation

## 2012-08-09 DIAGNOSIS — D649 Anemia, unspecified: Secondary | ICD-10-CM | POA: Insufficient documentation

## 2012-08-09 NOTE — Progress Notes (Signed)
OFFICE NOTE  Chief Complaint:  Routine followup appointment  Primary Care Physician: Kirk Ruths, MD  HPI:  Marilyn Haas is a 72 year old female I am following for hypertension and dyslipidemia. She has also had issues with depression and anxiety. In fact, recently depression has been more of a significant problem for her and I guess saw Ben in your office who recommended increasing her citalopram. She also had some blood work indicating vitamin B and vitamin D deficiencies. Since undergoing supplementation she is feeling better; however, she has not increased her citalopram, but is considering it. Her hypertension appears to be well controlled. She has had not chest pain, worsening shortness of breath, palpitations, presyncope or syncopal symptoms.   PMHx:  Past Medical History  Diagnosis Date  . Hypertension   . Dysphagia   . Hiatal hernia   . Anemia   . Hyperlipidemia   . Colonic polyp     adenomatous poly. No high grade dysplasia or invasive malignancy. 07/06/2006  . Depression     Past Surgical History  Procedure Laterality Date  . Hip fracture surgery      05/2009    FAMHx:  Family History  Problem Relation Age of Onset  . Heart failure Mother   . Diabetes Mother   . Hypertension Mother   . Heart disease Sister     had MI at age43    SOCHx:   reports that she has never smoked. She does not have any smokeless tobacco history on file. She reports that she does not drink alcohol or use illicit drugs.  ALLERGIES:  Allergies  Allergen Reactions  . Avelox (Moxifloxacin Hcl In Nacl) Other (See Comments)    "go crazy"    ROS: A comprehensive review of systems was negative except for: Musculoskeletal: positive for stiff joints  HOME MEDS: Current Outpatient Prescriptions  Medication Sig Dispense Refill  . ALPRAZolam (XANAX) 0.25 MG tablet Take 0.25 mg by mouth at bedtime as needed. For sleep      . aspirin EC 81 MG tablet Take 81 mg by mouth daily.       . citalopram (CELEXA) 10 MG tablet Take 40 mg by mouth daily.       . pantoprazole (PROTONIX) 40 MG tablet TAKE ONE (1) TABLET BY MOUTH EVERY DAY  30 tablet  5  . simvastatin (ZOCOR) 40 MG tablet Take 40 mg by mouth every evening.      . ergocalciferol (VITAMIN D2) 50000 UNITS capsule Take 50,000 Units by mouth once a week.       No current facility-administered medications for this visit.    LABS/IMAGING: No results found for this or any previous visit (from the past 48 hour(s)). No results found.  VITALS: BP 140/78  Pulse 72  Ht 5\' 5"  (1.651 m)  Wt 128 lb (58.06 kg)  BMI 21.3 kg/m2  EXAM: General appearance: alert and no distress Neck: no adenopathy, no carotid bruit, no JVD, supple, symmetrical, trachea midline and thyroid not enlarged, symmetric, no tenderness/mass/nodules Lungs: clear to auscultation bilaterally Heart: regular rate and rhythm, S1, S2 normal, no murmur, click, rub or gallop Abdomen: soft, non-tender; bowel sounds normal; no masses,  no organomegaly Extremities: extremities normal, atraumatic, no cyanosis or edema Pulses: 2+ and symmetric Skin: Skin color, texture, turgor normal. No rashes or lesions Neurologic: Grossly normal  EKG: Normal sinus rhythm at 72  ASSESSMENT: 1. Hypertension-controlled 2. Dyslipidemia 3. Depression/anxiety 4. Recurrent esophageal stricture with dilatation 5. Palpitations-resolved 6. Anemia  PLAN:  1.   Overall, Marilyn Haas is doing very well without palpitations or worsening depression or anxiety. She does feel better with repletion of her vitamin B12 and vitamin D.  Her blood pressure is at goal and her only complaint is left hip pain.  We'll plan to continue current medications incentive recheck CBC, BMP, and lipid profile today.  Plan to see her back in 6 months or sooner as necessary.  Chrystie Nose, MD, [facility] Attending Cardiologist [facility]  HILTY,Kenneth C 08/09/2012, 1:42 PM

## 2012-08-09 NOTE — Patient Instructions (Signed)
Follow-up in 6 months in [place].

## 2012-08-10 LAB — CBC
Hemoglobin: 10.7 g/dL — ABNORMAL LOW (ref 12.0–15.0)
MCH: 29.2 pg (ref 26.0–34.0)
MCHC: 32.9 g/dL (ref 30.0–36.0)

## 2012-08-10 LAB — LIPID PANEL
Cholesterol: 153 mg/dL (ref 0–200)
Total CHOL/HDL Ratio: 3.7 Ratio

## 2012-08-10 LAB — BASIC METABOLIC PANEL
BUN: 12 mg/dL (ref 6–23)
Calcium: 9.2 mg/dL (ref 8.4–10.5)
Glucose, Bld: 85 mg/dL (ref 70–99)
Sodium: 139 mEq/L (ref 135–145)

## 2012-08-16 ENCOUNTER — Other Ambulatory Visit: Payer: Self-pay | Admitting: Internal Medicine

## 2012-08-17 ENCOUNTER — Other Ambulatory Visit: Payer: Self-pay | Admitting: Pharmacist Clinician (PhC)/ Clinical Pharmacy Specialist

## 2012-08-17 MED ORDER — SIMVASTATIN 40 MG PO TABS: 40.0000 mg | ORAL_TABLET | Freq: Every day | ORAL | Status: DC

## 2012-09-01 DIAGNOSIS — M25559 Pain in unspecified hip: Secondary | ICD-10-CM | POA: Diagnosis not present

## 2012-10-04 DIAGNOSIS — R5381 Other malaise: Secondary | ICD-10-CM | POA: Diagnosis not present

## 2012-10-04 DIAGNOSIS — M549 Dorsalgia, unspecified: Secondary | ICD-10-CM | POA: Diagnosis not present

## 2012-10-04 DIAGNOSIS — M545 Low back pain, unspecified: Secondary | ICD-10-CM | POA: Diagnosis not present

## 2012-10-04 DIAGNOSIS — R5383 Other fatigue: Secondary | ICD-10-CM | POA: Diagnosis not present

## 2012-10-04 DIAGNOSIS — R350 Frequency of micturition: Secondary | ICD-10-CM | POA: Diagnosis not present

## 2012-10-04 DIAGNOSIS — R1031 Right lower quadrant pain: Secondary | ICD-10-CM | POA: Diagnosis not present

## 2012-10-04 DIAGNOSIS — R109 Unspecified abdominal pain: Secondary | ICD-10-CM | POA: Diagnosis not present

## 2012-10-07 DIAGNOSIS — R109 Unspecified abdominal pain: Secondary | ICD-10-CM | POA: Diagnosis not present

## 2012-10-14 DIAGNOSIS — G4700 Insomnia, unspecified: Secondary | ICD-10-CM | POA: Diagnosis not present

## 2012-10-14 DIAGNOSIS — F4320 Adjustment disorder, unspecified: Secondary | ICD-10-CM | POA: Diagnosis not present

## 2012-10-14 DIAGNOSIS — IMO0002 Reserved for concepts with insufficient information to code with codable children: Secondary | ICD-10-CM | POA: Diagnosis not present

## 2012-12-26 DIAGNOSIS — Z23 Encounter for immunization: Secondary | ICD-10-CM | POA: Diagnosis not present

## 2013-01-19 ENCOUNTER — Other Ambulatory Visit (HOSPITAL_COMMUNITY): Payer: Self-pay | Admitting: Internal Medicine

## 2013-01-31 ENCOUNTER — Encounter: Payer: Self-pay | Admitting: Internal Medicine

## 2013-01-31 ENCOUNTER — Ambulatory Visit (INDEPENDENT_AMBULATORY_CARE_PROVIDER_SITE_OTHER): Payer: Medicare Other | Admitting: Internal Medicine

## 2013-01-31 VITALS — BP 120/70 | HR 71 | Ht 65.5 in | Wt 132.2 lb

## 2013-01-31 DIAGNOSIS — R002 Palpitations: Secondary | ICD-10-CM

## 2013-01-31 DIAGNOSIS — M25559 Pain in unspecified hip: Secondary | ICD-10-CM

## 2013-01-31 DIAGNOSIS — Z139 Encounter for screening, unspecified: Secondary | ICD-10-CM

## 2013-01-31 DIAGNOSIS — Z131 Encounter for screening for diabetes mellitus: Secondary | ICD-10-CM

## 2013-01-31 DIAGNOSIS — I10 Essential (primary) hypertension: Secondary | ICD-10-CM | POA: Diagnosis not present

## 2013-01-31 DIAGNOSIS — E785 Hyperlipidemia, unspecified: Secondary | ICD-10-CM

## 2013-01-31 DIAGNOSIS — Z79899 Other long term (current) drug therapy: Secondary | ICD-10-CM

## 2013-01-31 DIAGNOSIS — F411 Generalized anxiety disorder: Secondary | ICD-10-CM

## 2013-01-31 DIAGNOSIS — R739 Hyperglycemia, unspecified: Secondary | ICD-10-CM

## 2013-01-31 DIAGNOSIS — F419 Anxiety disorder, unspecified: Secondary | ICD-10-CM

## 2013-01-31 DIAGNOSIS — R7309 Other abnormal glucose: Secondary | ICD-10-CM

## 2013-01-31 DIAGNOSIS — M25552 Pain in left hip: Secondary | ICD-10-CM

## 2013-01-31 NOTE — Patient Instructions (Addendum)
Your physician wants you to follow-up in: 6 months. You will receive a reminder letter in the mail two months in advance. If you don't receive a letter, please call our office to schedule the follow-up appointment.   Please have fasting blood work.

## 2013-01-31 NOTE — Progress Notes (Signed)
OFFICE NOTE  Chief Complaint:  Left hip pain, routine follow-up  Primary Care Physician: Kirk Ruths, MD  HPI:  Marilyn Haas  is a 72 year old female I am following for hypertension and dyslipidemia. She has also had issues with depression and anxiety.Her hypertension appears to be well controlled. She has had not chest pain, worsening shortness of breath, palpitations, presyncope or syncopal symptoms. Her main complaint is some left hip pain. Apparently she had fractured her left hip in the past and underwent a ball replacement, but did not have total joint revision. Since that time she's had increasing difficulty in using her left hip to the point where she is in significant pain. She was told that she may need a total hip replacement/revision. She is considering seeing Dr. Despina Hick for his opinion for surgery in the near future.  PMHx:  Past Medical History  Diagnosis Date  . Hypertension   . Dysphagia   . Hiatal hernia   . Anemia   . Hyperlipidemia   . Colonic polyp     adenomatous poly. No high grade dysplasia or invasive malignancy. 07/06/2006  . Depression     Past Surgical History  Procedure Laterality Date  . Hip fracture surgery      05/2009    FAMHx:  Family History  Problem Relation Age of Onset  . Heart failure Mother   . Diabetes Mother   . Hypertension Mother   . Heart disease Sister     had MI at age33    SOCHx:   reports that she has quit smoking. She has never used smokeless tobacco. She reports that she does not drink alcohol or use illicit drugs.  ALLERGIES:  Allergies  Allergen Reactions  . Avelox [Moxifloxacin Hcl In Nacl] Other (See Comments)    "go crazy"    ROS: A comprehensive review of systems was negative except for: Musculoskeletal: positive for left hip paib  HOME MEDS: Current Outpatient Prescriptions  Medication Sig Dispense Refill  . ALPRAZolam (XANAX) 0.5 MG tablet Take 0.5 mg by mouth at bedtime as needed for  anxiety.      Marland Kitchen aspirin EC 81 MG tablet Take 81 mg by mouth daily.      . Cholecalciferol (VITAMIN D PO) Take by mouth daily.      . citalopram (CELEXA) 10 MG tablet Take 40 mg by mouth daily.       . pantoprazole (PROTONIX) 40 MG tablet TAKE ONE (1) TABLET BY MOUTH EVERY DAY  30 tablet  5  . simvastatin (ZOCOR) 40 MG tablet Take 1 tablet (40 mg total) by mouth at bedtime.  30 tablet  6   No current facility-administered medications for this visit.    LABS/IMAGING: No results found for this or any previous visit (from the past 48 hour(s)). No results found.  VITALS: BP 120/70  Pulse 71  Ht 5' 5.5" (1.664 m)  Wt 132 lb 3.2 oz (59.966 kg)  BMI 21.66 kg/m2  EXAM: General appearance: alert and no distress Neck: no carotid bruit and no JVD Lungs: clear to auscultation bilaterally Heart: normal apical impulse Abdomen: soft, non-tender; bowel sounds normal; no masses,  no organomegaly Extremities: extremities normal, atraumatic, no cyanosis or edema Pulses: 2+ and symmetric Skin: Skin color, texture, turgor normal. No rashes or lesions Neurologic: Grossly normal psych: Mood, affect normal  EKG: Normal sinus rhythm at 71  ASSESSMENT: 1. Hypertension-controlled 2. Dyslipidemia 3. Depression / anxiety 4. Esophageal stricture status post dilatation 5. History  of left hip fracture status post partial replacement with ongoing hip pain  PLAN: 1.   Marilyn Haas is complaining of ongoing hip pain and will be evaluated for possible total hip replacement. I do believe she would be low risk for such a procedure but would be happy to provide cardiac clearance for it. She continues to do well with good blood pressure control and her cholesterol has been well controlled as well. I'll plan to see her back in 6 months in conjunction with her husband's visit.  Chrystie Nose, MD, [facility] Attending Cardiologist [facility]  HILTY,Kenneth C 01/31/2013, 4:43 PM

## 2013-02-01 ENCOUNTER — Encounter: Payer: Self-pay | Admitting: Internal Medicine

## 2013-02-17 DIAGNOSIS — E785 Hyperlipidemia, unspecified: Secondary | ICD-10-CM | POA: Diagnosis not present

## 2013-02-17 DIAGNOSIS — M25559 Pain in unspecified hip: Secondary | ICD-10-CM | POA: Diagnosis not present

## 2013-02-17 DIAGNOSIS — Z79899 Other long term (current) drug therapy: Secondary | ICD-10-CM | POA: Diagnosis not present

## 2013-02-17 DIAGNOSIS — R7309 Other abnormal glucose: Secondary | ICD-10-CM | POA: Diagnosis not present

## 2013-02-17 LAB — COMPREHENSIVE METABOLIC PANEL
ALT: 11 U/L (ref 0–35)
CO2: 31 mEq/L (ref 19–32)
Calcium: 9.3 mg/dL (ref 8.4–10.5)
Chloride: 102 mEq/L (ref 96–112)
Potassium: 4.7 mEq/L (ref 3.5–5.3)
Sodium: 139 mEq/L (ref 135–145)
Total Protein: 7 g/dL (ref 6.0–8.3)

## 2013-02-17 LAB — HEMOGLOBIN A1C: Hgb A1c MFr Bld: 5.4 % (ref <5.7)

## 2013-02-20 ENCOUNTER — Other Ambulatory Visit (HOSPITAL_COMMUNITY): Payer: Self-pay | Admitting: Orthopedic Surgery

## 2013-02-20 DIAGNOSIS — T84038A Mechanical loosening of other internal prosthetic joint, initial encounter: Secondary | ICD-10-CM

## 2013-02-20 DIAGNOSIS — Z96649 Presence of unspecified artificial hip joint: Secondary | ICD-10-CM

## 2013-02-20 DIAGNOSIS — M25552 Pain in left hip: Secondary | ICD-10-CM

## 2013-02-20 LAB — NMR LIPOPROFILE WITH LIPIDS
Cholesterol, Total: 150 mg/dL (ref <200)
HDL Particle Number: 29.3 umol/L — ABNORMAL LOW (ref >=30.5)
HDL-C: 40 mg/dL (ref >=40)
LDL (calc): 88 mg/dL (ref <100)
LP-IR Score: 39 (ref <=45)
Triglycerides: 111 mg/dL (ref <150)

## 2013-02-22 ENCOUNTER — Encounter (HOSPITAL_COMMUNITY)
Admission: RE | Admit: 2013-02-22 | Discharge: 2013-02-22 | Disposition: A | Discharge status: Home / Self Care | Payer: Medicare Other | Source: Ambulatory Visit | Attending: Orthopedic Surgery | Admitting: Orthopedic Surgery

## 2013-02-22 ENCOUNTER — Encounter (HOSPITAL_COMMUNITY): Payer: Self-pay

## 2013-02-22 DIAGNOSIS — M25552 Pain in left hip: Secondary | ICD-10-CM

## 2013-02-22 DIAGNOSIS — Z96649 Presence of unspecified artificial hip joint: Secondary | ICD-10-CM

## 2013-02-22 DIAGNOSIS — T84038A Mechanical loosening of other internal prosthetic joint, initial encounter: Secondary | ICD-10-CM

## 2013-02-22 DIAGNOSIS — M25559 Pain in unspecified hip: Secondary | ICD-10-CM | POA: Insufficient documentation

## 2013-02-22 MED ORDER — TECHNETIUM TC 99M MEDRONATE IV KIT
25.0000 | PACK | Freq: Once | INTRAVENOUS | Status: AC | PRN
  Administered 2013-02-22: 25 via INTRAVENOUS

## 2013-02-24 ENCOUNTER — Encounter: Payer: Self-pay | Admitting: *Deleted

## 2013-03-21 DIAGNOSIS — M25559 Pain in unspecified hip: Secondary | ICD-10-CM | POA: Diagnosis not present

## 2013-03-23 DIAGNOSIS — IMO0002 Reserved for concepts with insufficient information to code with codable children: Secondary | ICD-10-CM | POA: Diagnosis not present

## 2013-03-23 DIAGNOSIS — E785 Hyperlipidemia, unspecified: Secondary | ICD-10-CM | POA: Diagnosis not present

## 2013-03-23 DIAGNOSIS — G8929 Other chronic pain: Secondary | ICD-10-CM | POA: Diagnosis not present

## 2013-03-23 DIAGNOSIS — I10 Essential (primary) hypertension: Secondary | ICD-10-CM | POA: Diagnosis not present

## 2013-04-18 ENCOUNTER — Other Ambulatory Visit: Payer: Self-pay | Admitting: Internal Medicine

## 2013-04-18 NOTE — Telephone Encounter (Signed)
Rx was sent to pharmacy electronically. 

## 2013-06-08 DIAGNOSIS — IMO0002 Reserved for concepts with insufficient information to code with codable children: Secondary | ICD-10-CM | POA: Diagnosis not present

## 2013-06-08 DIAGNOSIS — I10 Essential (primary) hypertension: Secondary | ICD-10-CM | POA: Diagnosis not present

## 2013-06-08 DIAGNOSIS — R5381 Other malaise: Secondary | ICD-10-CM | POA: Diagnosis not present

## 2013-06-08 DIAGNOSIS — E785 Hyperlipidemia, unspecified: Secondary | ICD-10-CM | POA: Diagnosis not present

## 2013-06-08 DIAGNOSIS — M159 Polyosteoarthritis, unspecified: Secondary | ICD-10-CM | POA: Diagnosis not present

## 2013-06-08 DIAGNOSIS — D643 Other sideroblastic anemias: Secondary | ICD-10-CM | POA: Diagnosis not present

## 2013-07-19 ENCOUNTER — Encounter (INDEPENDENT_AMBULATORY_CARE_PROVIDER_SITE_OTHER): Payer: Self-pay

## 2013-07-19 ENCOUNTER — Encounter (INDEPENDENT_AMBULATORY_CARE_PROVIDER_SITE_OTHER): Payer: Self-pay | Admitting: *Deleted

## 2013-08-08 ENCOUNTER — Other Ambulatory Visit (HOSPITAL_COMMUNITY): Payer: Self-pay | Admitting: Internal Medicine

## 2013-08-08 NOTE — Telephone Encounter (Signed)
Per Dr.Rehman may approve with 2 refills .Patient will need to have a office visit prior to further refills.

## 2013-09-14 ENCOUNTER — Ambulatory Visit (INDEPENDENT_AMBULATORY_CARE_PROVIDER_SITE_OTHER): Payer: Medicare Other | Admitting: Internal Medicine

## 2013-09-14 ENCOUNTER — Encounter: Payer: Self-pay | Admitting: Internal Medicine

## 2013-09-14 VITALS — BP 130/80 | HR 63 | Ht 65.5 in | Wt 137.4 lb

## 2013-09-14 DIAGNOSIS — M25559 Pain in unspecified hip: Secondary | ICD-10-CM

## 2013-09-14 DIAGNOSIS — I10 Essential (primary) hypertension: Secondary | ICD-10-CM | POA: Diagnosis not present

## 2013-09-14 DIAGNOSIS — M25552 Pain in left hip: Secondary | ICD-10-CM

## 2013-09-14 DIAGNOSIS — F411 Generalized anxiety disorder: Secondary | ICD-10-CM | POA: Diagnosis not present

## 2013-09-14 DIAGNOSIS — E785 Hyperlipidemia, unspecified: Secondary | ICD-10-CM | POA: Diagnosis not present

## 2013-09-14 DIAGNOSIS — F419 Anxiety disorder, unspecified: Secondary | ICD-10-CM

## 2013-09-14 NOTE — Patient Instructions (Signed)
Your physician wants you to follow-up in:  6 months. You will receive a reminder letter in the mail two months in advance. If you don't receive a letter, please call our office to schedule the follow-up appointment.   

## 2013-09-14 NOTE — Progress Notes (Signed)
OFFICE NOTE  Chief Complaint:  Left hip pain, routine follow-up  Primary Care Physician: Leonides Grills, MD  HPI:  Marilyn Haas  is a 73 year old female I am following for hypertension and dyslipidemia. She has also had issues with depression and anxiety.Her hypertension appears to be well controlled. She has had not chest pain, worsening shortness of breath, palpitations, presyncope or syncopal symptoms. Her main complaint is some left hip pain. Apparently she had fractured her left hip in the past and underwent a ball replacement, but did not have total joint revision. Since that time she's had increasing difficulty in using her left hip to the point where she is in significant pain. She was told that she may need a total hip replacement/revision. She is considering seeing Dr. Maureen Ralphs for his opinion for surgery in the near future.  Mrs. Maggard returns today he is feeling well. She denies any worsening shortness of breath or chest pain. She was told that she needed hip replacement however he is putting that off. Cholesterol is well-controlled on her current medicine regimen. Her particle number was [id number] at her last office visit in December 2014.  PMHx:  Past Medical History  Diagnosis Date  . Hypertension   . Dysphagia   . Hiatal hernia   . Anemia   . Hyperlipidemia   . Colonic polyp     adenomatous poly. No high grade dysplasia or invasive malignancy. 07/06/2006  . Depression     Past Surgical History  Procedure Laterality Date  . Hip fracture surgery      05/2009    FAMHx:  Family History  Problem Relation Age of Onset  . Heart failure Mother   . Diabetes Mother   . Hypertension Mother   . Heart disease Sister     had MI at age63    SOCHx:   reports that she has quit smoking. She has never used smokeless tobacco. She reports that she does not drink alcohol or use illicit drugs.  ALLERGIES:  Allergies  Allergen Reactions  . Avelox [Moxifloxacin Hcl In  Nacl] Other (See Comments)    "go crazy"    ROS: A comprehensive review of systems was negative except for: Musculoskeletal: positive for left hip paib  HOME MEDS: Current Outpatient Prescriptions  Medication Sig Dispense Refill  . ALPRAZolam (XANAX) 0.5 MG tablet Take 0.5 mg by mouth at bedtime as needed for anxiety.      Marland Kitchen aspirin EC 81 MG tablet Take 81 mg by mouth daily.      . Cholecalciferol (VITAMIN D PO) Take by mouth daily.      . citalopram (CELEXA) 10 MG tablet Take 40 mg by mouth daily.       . pantoprazole (PROTONIX) 40 MG tablet TAKE ONE (1) TABLET BY MOUTH EVERY DAY  30 tablet  2  . simvastatin (ZOCOR) 40 MG tablet TAKE ONE TABLET BY MOUTH EVERY NIGHT AT BEDTIME  30 tablet  9   No current facility-administered medications for this visit.    LABS/IMAGING: No results found for this or any previous visit (from the past 48 hour(s)). No results found.  VITALS: BP 130/80  Pulse 63  Ht 5' 5.5" (1.664 m)  Wt 137 lb 6.4 oz (62.324 kg)  BMI 22.51 kg/m2  EXAM: General appearance: alert and no distress Neck: no carotid bruit and no JVD Lungs: clear to auscultation bilaterally Heart: normal apical impulse Abdomen: soft, non-tender; bowel sounds normal; no masses,  no organomegaly Extremities:  extremities normal, atraumatic, no cyanosis or edema Pulses: 2+ and symmetric Skin: Skin color, texture, turgor normal. No rashes or lesions Neurologic: Grossly normal psych: Mood, affect normal  EKG: Normal sinus rhythm at 63  ASSESSMENT: 1. Hypertension-controlled 2. Dyslipidemia- at goal 3. Depression / anxiety 4. Esophageal stricture status post dilatation 5. History of left hip fracture status post partial replacement with ongoing hip pain  PLAN: 1.   Mrs. Hearns is doing well. Her blood pressure is well-controlled. Her cholesterol is at goal. She recently had repeat esophageal dilatation and is swallowing okay. She continues to have left hip pain has been evaluated  by 2 orthopedists to say that she does need total hip replacement. She is concerned because she does not have help at home with rehabilitation. She is continuing to put the surgery off. No new cardiac issues at this time. Plan to see her back in 6 months.  Pixie Casino, MD, [facility] Attending Cardiologist [facility]  Lavaya Defreitas C 09/14/2013, 10:57 AM

## 2013-10-27 DIAGNOSIS — G4700 Insomnia, unspecified: Secondary | ICD-10-CM | POA: Diagnosis not present

## 2013-10-27 DIAGNOSIS — IMO0002 Reserved for concepts with insufficient information to code with codable children: Secondary | ICD-10-CM | POA: Diagnosis not present

## 2013-10-27 DIAGNOSIS — D649 Anemia, unspecified: Secondary | ICD-10-CM | POA: Diagnosis not present

## 2013-10-27 DIAGNOSIS — G8929 Other chronic pain: Secondary | ICD-10-CM | POA: Diagnosis not present

## 2013-11-13 ENCOUNTER — Other Ambulatory Visit (INDEPENDENT_AMBULATORY_CARE_PROVIDER_SITE_OTHER): Payer: Self-pay | Admitting: Internal Medicine

## 2013-11-13 NOTE — Telephone Encounter (Signed)
Per Dr.Rehman the patient will need to have an office visit.

## 2014-01-01 DIAGNOSIS — Z23 Encounter for immunization: Secondary | ICD-10-CM | POA: Diagnosis not present

## 2014-01-01 DIAGNOSIS — D510 Vitamin B12 deficiency anemia due to intrinsic factor deficiency: Secondary | ICD-10-CM | POA: Diagnosis not present

## 2014-01-11 DIAGNOSIS — Z6824 Body mass index (BMI) 24.0-24.9, adult: Secondary | ICD-10-CM | POA: Diagnosis not present

## 2014-01-11 DIAGNOSIS — R197 Diarrhea, unspecified: Secondary | ICD-10-CM | POA: Diagnosis not present

## 2014-01-15 ENCOUNTER — Encounter (INDEPENDENT_AMBULATORY_CARE_PROVIDER_SITE_OTHER): Payer: Self-pay | Admitting: Internal Medicine

## 2014-01-15 ENCOUNTER — Encounter (INDEPENDENT_AMBULATORY_CARE_PROVIDER_SITE_OTHER): Payer: Self-pay | Admitting: *Deleted

## 2014-01-15 ENCOUNTER — Other Ambulatory Visit (INDEPENDENT_AMBULATORY_CARE_PROVIDER_SITE_OTHER): Payer: Self-pay | Admitting: *Deleted

## 2014-01-15 ENCOUNTER — Ambulatory Visit (INDEPENDENT_AMBULATORY_CARE_PROVIDER_SITE_OTHER): Payer: Medicare Other | Admitting: Internal Medicine

## 2014-01-15 VITALS — BP 130/80 | HR 74 | Temp 98.1°F | Resp 16 | Ht 65.0 in | Wt 129.9 lb

## 2014-01-15 DIAGNOSIS — R1310 Dysphagia, unspecified: Secondary | ICD-10-CM

## 2014-01-15 DIAGNOSIS — K589 Irritable bowel syndrome without diarrhea: Secondary | ICD-10-CM | POA: Diagnosis not present

## 2014-01-15 DIAGNOSIS — R1314 Dysphagia, pharyngoesophageal phase: Secondary | ICD-10-CM

## 2014-01-15 DIAGNOSIS — R1319 Other dysphagia: Secondary | ICD-10-CM

## 2014-01-15 DIAGNOSIS — K219 Gastro-esophageal reflux disease without esophagitis: Secondary | ICD-10-CM | POA: Diagnosis not present

## 2014-01-15 MED ORDER — HYOSCYAMINE SULFATE 0.125 MG SL SUBL: 0.1250 mg | SUBLINGUAL_TABLET | Freq: Four times a day (QID) | SUBLINGUAL | Status: DC | PRN

## 2014-01-15 MED ORDER — ALIGN 4 MG PO CAPS: 4.0000 mg | ORAL_CAPSULE | Freq: Every day | ORAL | Status: DC

## 2014-01-15 NOTE — Patient Instructions (Signed)
Esophagogastroduodenoscopy and esophageal dilation to be scheduled. 

## 2014-01-15 NOTE — Progress Notes (Signed)
Presenting complaint;  Dysphagia; abdominal pain flatulence and recent bout with diarrhea.  Subjective:  Patient is 73 year old Caucasian female was here for scheduled visit. She was last seen in March 2013 for dysphagia and underwent EGD with ED. She says dysphagia started about 6 months ago and this occurring more frequently. She has difficulty swallowing solids but has no problem with liquids. She points to sternum areas site of bolus obstruction. She has had few episodes where she has to induce vomiting for relief. She says heartburn is well controlled with therapy. Her appetite is normal and she has not lost any weight. She also complains of abdominal pain and diarrhea. Symptoms started about 2 months ago. There is no history of antibiotic use. She has been using Pepto-Bismol which has helped. Lately she has had normal stools however bloating and cramping has not gone away. She also complains of excessive flatus that she cannot control. She says she's been watching IBS commercial's in her symptoms fit with this diagnosis. Prior to onset of these symptoms he used to have irregular bowel movements and at time would use suppository. She has not experienced nausea or vomiting.   Current Medications: Outpatient Encounter Prescriptions as of 01/15/2014  Medication Sig  . ALPRAZolam (XANAX) 0.5 MG tablet Take 0.5 mg by mouth at bedtime as needed for anxiety.  Marland Kitchen aspirin EC 81 MG tablet Take 81 mg by mouth daily.  . citalopram (CELEXA) 10 MG tablet Take 40 mg by mouth daily.   Marland Kitchen HYDROcodone-acetaminophen (NORCO) 10-325 MG per tablet Take 0.5 tablets by mouth as needed for moderate pain.   . pantoprazole (PROTONIX) 40 MG tablet TAKE ONE (1) TABLET EACH DAY  . simvastatin (ZOCOR) 40 MG tablet TAKE ONE TABLET BY MOUTH EVERY NIGHT AT BEDTIME  . [DISCONTINUED] Cholecalciferol (VITAMIN D PO) Take by mouth daily.   Past Medical History  Diagnosis Date  . Hypertension   . Dysphagia   . Hiatal hernia    . Anemia   . Hyperlipidemia   . Colonic polyp     adenomatous poly. No high grade dysplasia or invasive malignancy. 07/06/2006  . Depression    Past Surgical History  Procedure Laterality Date  . Hip fracture surgery      05/2009     Objective: Blood pressure 130/80, pulse 74, temperature 98.1 F (36.7 C), temperature source Oral, resp. rate 16, height 5\' 5"  (1.651 m), weight 129 lb 14.4 oz (58.922 kg). Patient is alert and in no acute distress. Conjunctiva is pink. Sclera is nonicteric Oropharyngeal mucosa is normal. No neck masses or thyromegaly noted. Cardiac exam with regular rhythm normal S1 and S2. No murmur or gallop noted. Lungs are clear to auscultation. Abdomen is full. Bowel sounds are normal. Abdomen is soft with mild tenderness in LLQ. No organomegaly or masses. Rectal examination reveals dark stool but is quite negative.  No LE edema or clubbing noted.   Assessment:  #1.solid food dysphagia. Patient has history of GERD and distal esophageal stricture which has been dilated on at least 3 different occasions; twice in [place] and more recently in March 2013 by me. Her esophagus needs to be redilated. Heartburn is well controlled with therapy. #2. Recent bout of diarrhea, abdominal pain and flatulence. Stool is guaiac-negative. 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 #3. History of colonic adenoma. Last colonoscopy was in 2009.    Plan:  EGD with ED near future. Align 4 mg by mouth daily. Levsin sublingual 1 tablet 4 times a  day when necessary. Office visit in 4 weeks.

## 2014-01-25 ENCOUNTER — Other Ambulatory Visit (INDEPENDENT_AMBULATORY_CARE_PROVIDER_SITE_OTHER): Payer: Self-pay | Admitting: *Deleted

## 2014-01-25 DIAGNOSIS — R1310 Dysphagia, unspecified: Secondary | ICD-10-CM

## 2014-01-25 MED ORDER — DEXTROSE 5 % IV SOLN
2.0000 g | Freq: Once | INTRAVENOUS | Status: AC
  Administered 2014-07-04: 2 g via INTRAVENOUS

## 2014-01-26 ENCOUNTER — Ambulatory Visit (HOSPITAL_COMMUNITY): Admission: RE | Admit: 2014-01-26 | Payer: Medicare Other | Source: Ambulatory Visit | Admitting: Internal Medicine

## 2014-01-26 ENCOUNTER — Encounter (HOSPITAL_COMMUNITY): Admission: RE | Payer: Self-pay | Source: Ambulatory Visit

## 2014-01-26 ENCOUNTER — Encounter (HOSPITAL_COMMUNITY): Payer: Self-pay | Admitting: *Deleted

## 2014-01-26 ENCOUNTER — Ambulatory Visit (HOSPITAL_COMMUNITY)
Admission: RE | Admit: 2014-01-26 | Discharge: 2014-01-26 | Disposition: A | Discharge status: Home / Self Care | Payer: Medicare Other | Source: Ambulatory Visit | Attending: Internal Medicine | Admitting: Internal Medicine

## 2014-01-26 ENCOUNTER — Encounter (HOSPITAL_COMMUNITY)
Admission: RE | Disposition: A | Discharge status: Home / Self Care | Payer: Self-pay | Source: Ambulatory Visit | Attending: Internal Medicine

## 2014-01-26 DIAGNOSIS — K449 Diaphragmatic hernia without obstruction or gangrene: Secondary | ICD-10-CM | POA: Insufficient documentation

## 2014-01-26 DIAGNOSIS — Z87891 Personal history of nicotine dependence: Secondary | ICD-10-CM | POA: Insufficient documentation

## 2014-01-26 DIAGNOSIS — F329 Major depressive disorder, single episode, unspecified: Secondary | ICD-10-CM | POA: Insufficient documentation

## 2014-01-26 DIAGNOSIS — E785 Hyperlipidemia, unspecified: Secondary | ICD-10-CM | POA: Insufficient documentation

## 2014-01-26 DIAGNOSIS — Z79899 Other long term (current) drug therapy: Secondary | ICD-10-CM | POA: Diagnosis not present

## 2014-01-26 DIAGNOSIS — D649 Anemia, unspecified: Secondary | ICD-10-CM | POA: Diagnosis not present

## 2014-01-26 DIAGNOSIS — K222 Esophageal obstruction: Secondary | ICD-10-CM | POA: Insufficient documentation

## 2014-01-26 DIAGNOSIS — K317 Polyp of stomach and duodenum: Secondary | ICD-10-CM | POA: Insufficient documentation

## 2014-01-26 DIAGNOSIS — K219 Gastro-esophageal reflux disease without esophagitis: Secondary | ICD-10-CM | POA: Insufficient documentation

## 2014-01-26 DIAGNOSIS — R1310 Dysphagia, unspecified: Secondary | ICD-10-CM | POA: Diagnosis not present

## 2014-01-26 DIAGNOSIS — Z7982 Long term (current) use of aspirin: Secondary | ICD-10-CM | POA: Insufficient documentation

## 2014-01-26 DIAGNOSIS — I10 Essential (primary) hypertension: Secondary | ICD-10-CM | POA: Insufficient documentation

## 2014-01-26 DIAGNOSIS — D131 Benign neoplasm of stomach: Secondary | ICD-10-CM | POA: Diagnosis not present

## 2014-01-26 HISTORY — PX: ESOPHAGOGASTRODUODENOSCOPY: SHX5428

## 2014-01-26 HISTORY — PX: MALONEY DILATION: SHX5535

## 2014-01-26 SURGERY — EGD (ESOPHAGOGASTRODUODENOSCOPY)
Anesthesia: Moderate Sedation

## 2014-01-26 MED ORDER — MIDAZOLAM HCL 5 MG/5ML IJ SOLN
INTRAMUSCULAR | Status: AC
  Filled 2014-01-26: qty 10

## 2014-01-26 MED ORDER — STERILE WATER FOR IRRIGATION IR SOLN
Status: DC | PRN
  Administered 2014-01-26: 11:00:00

## 2014-01-26 MED ORDER — MIDAZOLAM HCL 5 MG/5ML IJ SOLN
INTRAMUSCULAR | Status: AC
  Filled 2014-01-26: qty 5

## 2014-01-26 MED ORDER — MEPERIDINE HCL 50 MG/ML IJ SOLN
INTRAMUSCULAR | Status: DC
Start: 2014-01-26 — End: 2014-01-26
  Filled 2014-01-26: qty 1

## 2014-01-26 MED ORDER — MEPERIDINE HCL 50 MG/ML IJ SOLN
INTRAMUSCULAR | Status: DC | PRN
  Administered 2014-01-26 (×2): 25 mg via INTRAVENOUS

## 2014-01-26 MED ORDER — MIDAZOLAM HCL 5 MG/5ML IJ SOLN
INTRAMUSCULAR | Status: DC | PRN
  Administered 2014-01-26 (×5): 2 mg via INTRAVENOUS

## 2014-01-26 MED ORDER — BUTAMBEN-TETRACAINE-BENZOCAINE 2-2-14 % EX AERO
INHALATION_SPRAY | CUTANEOUS | Status: DC | PRN
  Administered 2014-01-26: 2 via TOPICAL

## 2014-01-26 MED ORDER — CEFAZOLIN SODIUM-DEXTROSE 2-3 GM-% IV SOLR
2.0000 g | Freq: Once | INTRAVENOUS | Status: AC
  Administered 2014-01-26: 2 g via INTRAVENOUS

## 2014-01-26 MED ORDER — SODIUM CHLORIDE 0.9 % IV SOLN: INTRAVENOUS | Status: DC

## 2014-01-26 MED ORDER — SODIUM CHLORIDE 0.9 % IV SOLN
INTRAVENOUS | Status: DC
  Administered 2014-01-26: 10:00:00 via INTRAVENOUS

## 2014-01-26 NOTE — Discharge Instructions (Signed)
No aspirin for 3 days. Resume other medications as before. Resume usual diet. No driving for 24 hours. Physician will call with biopsy results.  Esophagogastroduodenoscopy Care After Refer to this sheet in the next few weeks. These instructions provide you with information on caring for yourself after your procedure. Your caregiver may also give you more specific instructions. Your treatment has been planned according to current medical practices, but problems sometimes occur. Call your caregiver if you have any problems or questions after your procedure.  HOME CARE INSTRUCTIONS  Do not eat or drink anything until the numbing medicine (local anesthetic) has worn off and your gag reflex has returned. You will know that the local anesthetic has worn off when you can swallow comfortably.  Do not drive for 12 hours after the procedure or as directed by your caregiver.  Only take medicines as directed by your caregiver. SEEK MEDICAL CARE IF:   You cannot stop coughing.  You are not urinating at all or less than usual. SEEK IMMEDIATE MEDICAL CARE IF:  You have difficulty swallowing.  You cannot eat or drink.  You have worsening throat or chest pain.  You have dizziness, lightheadedness, or you faint.  You have nausea or vomiting.  You have chills.  You have a fever.  You have severe abdominal pain.  You have black, tarry, or bloody stools. Document Released: 02/17/2012 Document Reviewed: 02/17/2012 [facility] Patient Information 2015 [place]. This information is not intended to replace advice given to you by your health care provider. Make sure you discuss any questions you have with your health care provider.

## 2014-01-26 NOTE — Op Note (Addendum)
EGD PROCEDURE REPORT  PATIENT:  Marilyn Haas  MR#:  [id number] Birthdate:  [birth year], 73 y.o., female Endoscopist:  Dr. Rogene Houston, MD Referred By:  Dr. Purvis Kilts, MD Procedure Date: 01/26/2014  Procedure:   EGD with ED(balloon)  Indications:  Patient is 73 year old Caucasian female who presents with 6 month history of solid food dysphagia. She has chronic GERD and history of esophageal stricture and has undergone 3 dilation the past most recently in March 2013.            Informed Consent:  The risks, benefits, alternatives & imponderables which include, but are not limited to, bleeding, infection, perforation, drug reaction and potential missed lesion have been reviewed.  The potential for biopsy, lesion removal, esophageal dilation, etc. have also been discussed.  Questions have been answered.  All parties agreeable.  Please see history & physical in medical record for more information.  Medications:  Demerol 50 mg IV Versed 10 mg IV Cetacaine spray topically for oropharyngeal anesthesia  Description of procedure:  The endoscope was introduced through the mouth and advanced to the second portion of the duodenum without difficulty or limitations. The mucosal surfaces were surveyed very carefully during advancement of the scope and upon withdrawal.  Findings:  Esophagus:  Mucosa of the esophagus was normal. Distal segment was tortuous. High-grade Schatzki's ring noted at GE junction. This ring was initially disrupted with the scope. GEJ:  30 cm Hiatus:  35 cm Stomach:  Stomach was empty and distended very well with insufflation. Folds in the proximal stomach were normal. Examination of mucosa at gastric body, antrum, pyloric channel, angularis, fundus and cardia was normal. Hernia was easily seen on this view as well as Schatzki's ring.2 small polyps are noted involving the herniated part of the stomach. Duodenum:  Normal bulbar and post bulbar  mucosa.  Therapeutic/Diagnostic Maneuvers Performed:   Balloon dilator was advanced through the scope. The guidewire was pushed into gastric lumen. Balloon dilator was positioned across distal esophagus and GE junction and insufflated to a diameter of 18 mm. It was maintained for few seconds and passed distally. It was repositioned and dilated one more time. Balloon was deflated and withdrawn. Ring was noted to have been disrupted around 2 o'clock. It was further disrupted with focal biopsy on the other site but tissue was not saved.following these interventions I was able to pass the scope across GE junction without any resistance. Both of the small polyps involving the herniated part of the stomach were ablated via cold biopsy and submitted together.  Complications:   none  Impression: High-grade Schatzki's ring which was disrupted with combination of balloon dilation to 18 mm and focal biopsy. Moderate size sliding hiatal hernia.  Two small polyps were ablated from were ablated with cold biopsy forceps  Recommendations:  Continue anti-reflux measures. Continue pantoprazole as before. Office visit in 3-4 weeks.Marland Kitchen   Kiana Hollar U  01/26/2014  11:54 AM  CC: Dr. Hilma Favors, Betsy Coder, MD & Dr. Rayne Du ref. provider found

## 2014-01-26 NOTE — H&P (Signed)
Marilyn Haas is an 73 y.o. female.   Chief Complaint:  Patient is here for EGD and ED. HPI:  Patient is 73 year old Caucasian female who has history of esophageal stricture secondary to GERD. She presents with 6 month history of solid food dysphagia. Heartburn is well controlled with therapy. She has undergone 3 dilation the past 20 which were in [facility] and the last dilation was at this facility in March 2013. She denies anorexia or weight loss.  Past Medical History  Diagnosis Date  . Hypertension   . Dysphagia   . Hiatal hernia   . Anemia   . Hyperlipidemia   . Colonic polyp     adenomatous poly. No high grade dysplasia or invasive malignancy. 07/06/2006  . Depression     Past Surgical History  Procedure Laterality Date  . Hip fracture surgery      05/2009    Family History  Problem Relation Age of Onset  . Heart failure Mother   . Diabetes Mother   . Hypertension Mother   . Heart disease Sister     had MI at age64   Social History:  reports that she quit smoking about 53 years ago. She has never used smokeless tobacco. She reports that she does not drink alcohol or use illicit drugs.  Allergies:  Allergies  Allergen Reactions  . Avelox [Moxifloxacin Hcl In Nacl] Other (See Comments)    "go crazy"    Medications Prior to Admission  Medication Sig Dispense Refill  . ALPRAZolam (XANAX) 0.5 MG tablet Take 0.5 mg by mouth at bedtime as needed for anxiety.    Marland Kitchen aspirin EC 81 MG tablet Take 81 mg by mouth daily.    . citalopram (CELEXA) 10 MG tablet Take 40 mg by mouth daily.     Marland Kitchen HYDROcodone-acetaminophen (NORCO) 10-325 MG per tablet Take 0.5 tablets by mouth as needed for moderate pain.   0  . hyoscyamine (LEVSIN SL) 0.125 MG SL tablet Place 1 tablet (0.125 mg total) under the tongue every 6 (six) hours as needed. 60 tablet 0  . pantoprazole (PROTONIX) 40 MG tablet TAKE ONE (1) TABLET EACH DAY 30 tablet 3  . Probiotic Product (ALIGN) 4 MG CAPS Take  1 capsule (4 mg total) by mouth daily.    . simvastatin (ZOCOR) 40 MG tablet TAKE ONE TABLET BY MOUTH EVERY NIGHT AT BEDTIME 30 tablet 9    No results found for this or any previous visit (from the past 66 hour(s)). No results found.  ROS  Blood pressure 156/82, pulse 83, temperature 98.7 F (37.1 C), temperature source Oral, resp. rate 18, SpO2 99 %. Physical Exam  Constitutional: She appears well-developed and well-nourished.  HENT:  Mouth/Throat: Oropharynx is clear and moist.  Eyes: Conjunctivae are normal. No scleral icterus.  Neck: No thyromegaly present.  Cardiovascular: Normal rate, regular rhythm and normal heart sounds.   No murmur heard. Respiratory: Effort normal and breath sounds normal.  GI: Soft. She exhibits no distension and no mass. There is no tenderness.  Musculoskeletal: She exhibits no edema.  Lymphadenopathy:    She has no cervical adenopathy.  Neurological: She is alert.  Skin: Skin is warm and dry.     Assessment/Plan Solid food dysphagia. History of esophageal stricture secondary to GERD. EGD with ED.  MarilynNAJEEB Haas 01/26/2014, 11:19 AM

## 2014-01-29 ENCOUNTER — Encounter (HOSPITAL_COMMUNITY): Payer: Self-pay | Admitting: Internal Medicine

## 2014-02-12 ENCOUNTER — Encounter (INDEPENDENT_AMBULATORY_CARE_PROVIDER_SITE_OTHER): Payer: Self-pay | Admitting: Internal Medicine

## 2014-02-12 ENCOUNTER — Ambulatory Visit (INDEPENDENT_AMBULATORY_CARE_PROVIDER_SITE_OTHER): Payer: Medicare Other | Admitting: Internal Medicine

## 2014-02-12 VITALS — BP 122/80 | HR 68 | Temp 97.7°F | Ht 65.5 in | Wt 124.5 lb

## 2014-02-12 DIAGNOSIS — K589 Irritable bowel syndrome without diarrhea: Secondary | ICD-10-CM

## 2014-02-12 DIAGNOSIS — R1314 Dysphagia, pharyngoesophageal phase: Secondary | ICD-10-CM

## 2014-02-12 NOTE — Progress Notes (Signed)
Subjective:    Patient ID: Marilyn Haas, female    DOB: 18-Nov-1940, 73 y.o.   MRN: [id number]  HPI Here today for f/u after recent EGD/ED for dysphagia to solids. She tells me she is doing good.  No dysphagia. She says sometimes she alternates between constipation and diarrhea. She takes Pepto Bismol for the diarrhea. She is taking a Probiotic. She tells me Thanksgiving she had not had a BM for 1 1/2 weeks. She tried Miralax which has helped.  She usually has a BM every other day. Appetite is good. No weight loss.    01/26/2014 EGD/ED:   Procedure:   EGD with ED.  Indications:  Patient is a 73 year old Caucasian female who presents with dysphagia to solids. She is having the symptoms every day and has had multiple episodes of regurgitation currently. She has undergone few dilations in the past. Most recently was in March 2010 45 surgery 1 she was hospitalized and developed what sounds like a pill impaction. The stricture was dilated to 15 mm then.                                                                                                            Impression: High-grade stricture at the GE junction without changes of esophagitis. This stricture was dilated to 18 mm with a balloon. Small sliding hiatal hernia.    Review of Systems Past Medical History  Diagnosis Date  . Hypertension   . Dysphagia   . Hiatal hernia   . Anemia   . Hyperlipidemia   . Colonic polyp     adenomatous poly. No high grade dysplasia or invasive malignancy. 07/06/2006  . Depression     Past Surgical History  Procedure Laterality Date  . Hip fracture surgery      05/2009  . Esophagogastroduodenoscopy N/A 01/26/2014    Procedure: ESOPHAGOGASTRODUODENOSCOPY (EGD);  Surgeon: Rogene Houston, MD;  [facility];  Service: Endoscopy;  Laterality: N/A;  1030  . Maloney dilation N/A 01/26/2014    Procedure: Venia Minks DILATION;  Surgeon: Rogene Houston, MD;  [facility];   Service: Endoscopy;  Laterality: N/A;    Allergies  Allergen Reactions  . Avelox [Moxifloxacin Hcl In Nacl] Other (See Comments)    "go crazy"    Current Outpatient Prescriptions on File Prior to Visit  Medication Sig Dispense Refill  . ALPRAZolam (XANAX) 0.5 MG tablet Take 0.5 mg by mouth at bedtime as needed for anxiety.    . citalopram (CELEXA) 10 MG tablet Take 40 mg by mouth daily.     Marland Kitchen HYDROcodone-acetaminophen (NORCO) 10-325 MG per tablet Take 0.5 tablets by mouth as needed for moderate pain.   0  . hyoscyamine (LEVSIN SL) 0.125 MG SL tablet Place 1 tablet (0.125 mg total) under the tongue every 6 (six) hours as needed. 60 tablet 0  . pantoprazole (PROTONIX) 40 MG tablet TAKE ONE (1) TABLET EACH DAY 30 tablet 3  . Probiotic Product (ALIGN) 4 MG CAPS Take 1 capsule (4 mg total)  by mouth daily.    . simvastatin (ZOCOR) 40 MG tablet TAKE ONE TABLET BY MOUTH EVERY NIGHT AT BEDTIME 30 tablet 9   Current Facility-Administered Medications on File Prior to Visit  Medication Dose Route Frequency Provider Last Rate Last Dose  . ceFAZolin (ANCEF) 2 g in dextrose 5 % 50 mL IVPB  2 g Intravenous Once Rogene Houston, MD            Objective:   Physical Exam  Filed Vitals:   02/12/14 1023  Height: 5' 5.5" (1.664 m)  Weight: 124 lb 8 oz (56.473 kg)  Alert and oriented. Skin warm and dry. Oral mucosa is moist.   . Sclera anicteric, conjunctivae is pink. Thyroid not enlarged. No cervical lymphadenopathy. Lungs clear. Heart regular rate and rhythm.  Abdomen is soft. Bowel sounds are positive. No hepatomegaly. No abdominal masses felt. No tenderness.  No edema to lower extremities.           Assessment & Plan:  Dysphagia resolved after undergoing an EGD. Symptoms of IBS. She has started Miralax. Continue  1/2 scoop daily. Increase fiber in diet.

## 2014-02-12 NOTE — Patient Instructions (Signed)
Miralax 1/2 scoop a day.  Increase fiber in diet.

## 2014-02-19 DIAGNOSIS — Z23 Encounter for immunization: Secondary | ICD-10-CM | POA: Diagnosis not present

## 2014-02-19 DIAGNOSIS — D649 Anemia, unspecified: Secondary | ICD-10-CM | POA: Diagnosis not present

## 2014-02-20 ENCOUNTER — Ambulatory Visit (INDEPENDENT_AMBULATORY_CARE_PROVIDER_SITE_OTHER): Payer: Medicare Other | Admitting: Internal Medicine

## 2014-02-20 ENCOUNTER — Encounter: Payer: Self-pay | Admitting: Internal Medicine

## 2014-02-20 VITALS — BP 132/70 | HR 81 | Ht 65.0 in | Wt 125.0 lb

## 2014-02-20 DIAGNOSIS — E785 Hyperlipidemia, unspecified: Secondary | ICD-10-CM | POA: Diagnosis not present

## 2014-02-20 DIAGNOSIS — R002 Palpitations: Secondary | ICD-10-CM

## 2014-02-20 DIAGNOSIS — M25552 Pain in left hip: Secondary | ICD-10-CM | POA: Diagnosis not present

## 2014-02-20 DIAGNOSIS — I10 Essential (primary) hypertension: Secondary | ICD-10-CM

## 2014-02-20 NOTE — Progress Notes (Signed)
OFFICE NOTE  Chief Complaint:  Left hip pain, routine follow-up  Primary Care Physician: Purvis Kilts, MD  HPI:  Marilyn Haas  is a 73 year old female I am following for hypertension and dyslipidemia. She has also had issues with depression and anxiety.Her hypertension appears to be well controlled. She has had not chest pain, worsening shortness of breath, palpitations, presyncope or syncopal symptoms. Her main complaint is some left hip pain. Apparently she had fractured her left hip in the past and underwent a ball replacement, but did not have total joint revision. Since that time she's had increasing difficulty in using her left hip to the point where she is in significant pain. She was told that she may need a total hip replacement/revision. She is considering seeing Dr. Maureen Ralphs for his opinion for surgery in the near future.  Marilyn Haas returns today he is feeling well. She denies any worsening shortness of breath or chest pain. She was told that she needed hip replacement however he is putting that off. Cholesterol is well-controlled on her current medicine regimen. Her particle number was [id number] at her last office visit in December 2014.  Marilyn Haas returns today for follow-up. She is reporting some left hip pain and is being evaluated by Dr. Maureen Ralphs for hip replacement.  She occasionally gets some leg swelling. She's recently had been problems with IBS but that seems to be improving. She is having difficulty walking and is requesting a handicapped parking permit today.  PMHx:  Past Medical History  Diagnosis Date  . Hypertension   . Dysphagia   . Hiatal hernia   . Anemia   . Hyperlipidemia   . Colonic polyp     adenomatous poly. No high grade dysplasia or invasive malignancy. 07/06/2006  . Depression     Past Surgical History  Procedure Laterality Date  . Hip fracture surgery      05/2009  . Esophagogastroduodenoscopy N/A 01/26/2014    Procedure:  ESOPHAGOGASTRODUODENOSCOPY (EGD);  Surgeon: Rogene Houston, MD;  [facility];  Service: Endoscopy;  Laterality: N/A;  1030  . Maloney dilation N/A 01/26/2014    Procedure: Venia Minks DILATION;  Surgeon: Rogene Houston, MD;  [facility];  Service: Endoscopy;  Laterality: N/A;    FAMHx:  Family History  Problem Relation Age of Onset  . Heart failure Mother   . Diabetes Mother   . Hypertension Mother   . Heart disease Sister     had MI at age18    SOCHx:   reports that she quit smoking about 53 years ago. She has never used smokeless tobacco. She reports that she does not drink alcohol or use illicit drugs.  ALLERGIES:  Allergies  Allergen Reactions  . Avelox [Moxifloxacin Hcl In Nacl] Other (See Comments)    "go crazy"    ROS: A comprehensive review of systems was negative except for: Musculoskeletal: positive for left hip pain  HOME MEDS: Current Outpatient Prescriptions  Medication Sig Dispense Refill  . ALPRAZolam (XANAX) 0.5 MG tablet Take 0.5 mg by mouth at bedtime as needed for anxiety.    . citalopram (CELEXA) 10 MG tablet Take 40 mg by mouth daily.     Marland Kitchen HYDROcodone-acetaminophen (NORCO) 10-325 MG per tablet Take 0.5 tablets by mouth as needed for moderate pain.   0  . hyoscyamine (LEVSIN SL) 0.125 MG SL tablet Place 1 tablet (0.125 mg total) under the tongue every 6 (six) hours as needed. 60 tablet  0  . pantoprazole (PROTONIX) 40 MG tablet TAKE ONE (1) TABLET EACH DAY 30 tablet 3  . Probiotic Product (ALIGN) 4 MG CAPS Take 1 capsule (4 mg total) by mouth daily.    . simvastatin (ZOCOR) 40 MG tablet TAKE ONE TABLET BY MOUTH EVERY NIGHT AT BEDTIME 30 tablet 9   No current facility-administered medications for this visit.   Facility-Administered Medications Ordered in Other Visits  Medication Dose Route Frequency Provider Last Rate Last Dose  . ceFAZolin (ANCEF) 2 g in dextrose 5 % 50 mL IVPB  2 g Intravenous Once Rogene Houston, MD         LABS/IMAGING: No results found for this or any previous visit (from the past 48 hour(s)). No results found.  VITALS: BP 132/70 mmHg  Pulse 81  Ht 5\' 5"  (1.651 m)  Wt 125 lb (56.7 kg)  BMI 20.80 kg/m2  EXAM: General appearance: alert and no distress Neck: no carotid bruit and no JVD Lungs: clear to auscultation bilaterally Heart: normal apical impulse Abdomen: soft, non-tender; bowel sounds normal; no masses,  no organomegaly Extremities: extremities normal, atraumatic, no cyanosis or edema Pulses: 2+ and symmetric Skin: Skin color, texture, turgor normal. No rashes or lesions Neurologic: Grossly normal psych: Mood, affect normal  EKG: Normal sinus rhythm at 81  ASSESSMENT: 1. Hypertension-controlled 2. Dyslipidemia- at goal 3. Depression / anxiety 4. Esophageal stricture status post dilatation 5. History of left hip fracture status post partial replacement with ongoing hip pain  PLAN: 1.   Marilyn Haas is doing well. Her blood pressure is well-controlled. Her cholesterol is at goal. She recently had repeat esophageal dilatation and is swallowing okay. She continues to have left hip pain has been evaluated by 2 orthopedists to say that she does need total hip replacement. She is concerned because she does not have help at home with rehabilitation. She is continuing to put the surgery off. No new cardiac issues at this time.  I provided her with a handicapped parking permit. Plan to see her back in 6 months.  Marilyn Casino, MD, [facility] Attending Cardiologist [facility]  Marilyn Haas,Marilyn Haas 02/20/2014, 5:57 PM

## 2014-02-20 NOTE — Patient Instructions (Signed)
Your physician wants you to follow-up in: 6 months with Dr. Hilty. You will receive a reminder letter in the mail two months in advance. If you don't receive a letter, please call our office to schedule the follow-up appointment.    

## 2014-02-21 DIAGNOSIS — F5101 Primary insomnia: Secondary | ICD-10-CM | POA: Diagnosis not present

## 2014-02-21 DIAGNOSIS — F419 Anxiety disorder, unspecified: Secondary | ICD-10-CM | POA: Diagnosis not present

## 2014-02-21 DIAGNOSIS — G894 Chronic pain syndrome: Secondary | ICD-10-CM | POA: Diagnosis not present

## 2014-02-21 DIAGNOSIS — Z6822 Body mass index (BMI) 22.0-22.9, adult: Secondary | ICD-10-CM | POA: Diagnosis not present

## 2014-02-25 ENCOUNTER — Other Ambulatory Visit: Payer: Self-pay | Admitting: Internal Medicine

## 2014-02-26 NOTE — Telephone Encounter (Signed)
Rx was sent to pharmacy electronically. 

## 2014-03-12 DIAGNOSIS — M25552 Pain in left hip: Secondary | ICD-10-CM | POA: Diagnosis not present

## 2014-03-12 DIAGNOSIS — T8484XA Pain due to internal orthopedic prosthetic devices, implants and grafts, initial encounter: Secondary | ICD-10-CM | POA: Diagnosis not present

## 2014-03-22 ENCOUNTER — Telehealth: Payer: Self-pay | Admitting: *Deleted

## 2014-03-22 NOTE — Telephone Encounter (Signed)
Faxed clearance for left hip - TH revision - both components, TH revision femoral component only Attn: Debbie Vivona  Cleared with no recommendations or testing needed.

## 2014-04-02 ENCOUNTER — Other Ambulatory Visit (INDEPENDENT_AMBULATORY_CARE_PROVIDER_SITE_OTHER): Payer: Self-pay | Admitting: Internal Medicine

## 2014-05-25 DIAGNOSIS — D649 Anemia, unspecified: Secondary | ICD-10-CM | POA: Diagnosis not present

## 2014-06-05 DIAGNOSIS — G894 Chronic pain syndrome: Secondary | ICD-10-CM | POA: Diagnosis not present

## 2014-06-05 DIAGNOSIS — Z6820 Body mass index (BMI) 20.0-20.9, adult: Secondary | ICD-10-CM | POA: Diagnosis not present

## 2014-06-05 DIAGNOSIS — F419 Anxiety disorder, unspecified: Secondary | ICD-10-CM | POA: Diagnosis not present

## 2014-06-18 DIAGNOSIS — D649 Anemia, unspecified: Secondary | ICD-10-CM | POA: Diagnosis not present

## 2014-06-18 DIAGNOSIS — Z6820 Body mass index (BMI) 20.0-20.9, adult: Secondary | ICD-10-CM | POA: Diagnosis not present

## 2014-06-19 ENCOUNTER — Ambulatory Visit: Payer: Self-pay | Admitting: Orthopedic Surgery

## 2014-06-19 NOTE — Progress Notes (Signed)
Preoperative surgical orders have been place into the Epic hospital system for [name] on 06/19/2014, 3:00 PM  by Mickel Crow for surgery on 07-04-2014.  Preop Total Hip orders including Experel Injecion, IV Tylenol, and IV Decadron as long as there are no contraindications to the above medications. Arlee Muslim, PA-C

## 2014-06-26 ENCOUNTER — Encounter (HOSPITAL_COMMUNITY): Payer: Self-pay

## 2014-06-26 ENCOUNTER — Encounter (HOSPITAL_COMMUNITY)
Admission: RE | Admit: 2014-06-26 | Discharge: 2014-06-26 | Disposition: A | Discharge status: Home / Self Care | Payer: Medicare Other | Source: Ambulatory Visit | Attending: Orthopedic Surgery | Admitting: Orthopedic Surgery

## 2014-06-26 DIAGNOSIS — Z01818 Encounter for other preprocedural examination: Secondary | ICD-10-CM | POA: Insufficient documentation

## 2014-06-26 HISTORY — DX: Personal history of other medical treatment: Z92.89

## 2014-06-26 HISTORY — DX: Unspecified osteoarthritis, unspecified site: M19.90

## 2014-06-26 HISTORY — DX: Gastro-esophageal reflux disease without esophagitis: K21.9

## 2014-06-26 HISTORY — DX: Anxiety disorder, unspecified: F41.9

## 2014-06-26 LAB — COMPREHENSIVE METABOLIC PANEL
ALK PHOS: 80 U/L (ref 39–117)
ALT: 42 U/L — AB (ref 0–35)
AST: 48 U/L — ABNORMAL HIGH (ref 0–37)
Albumin: 4.6 g/dL (ref 3.5–5.2)
Anion gap: 7 (ref 5–15)
BILIRUBIN TOTAL: 0.7 mg/dL (ref 0.3–1.2)
BUN: 7 mg/dL (ref 6–23)
CHLORIDE: 98 mmol/L (ref 96–112)
CO2: 29 mmol/L (ref 19–32)
Calcium: 9.3 mg/dL (ref 8.4–10.5)
Creatinine, Ser: 0.67 mg/dL (ref 0.50–1.10)
GFR calc non Af Amer: 84 mL/min — ABNORMAL LOW (ref >90)
GLUCOSE: 91 mg/dL (ref 70–99)
POTASSIUM: 4.8 mmol/L (ref 3.5–5.1)
SODIUM: 134 mmol/L — AB (ref 135–145)
TOTAL PROTEIN: 7.5 g/dL (ref 6.0–8.3)

## 2014-06-26 LAB — ABO/RH: ABO/RH(D): O POS

## 2014-06-26 LAB — URINE MICROSCOPIC-ADD ON

## 2014-06-26 LAB — URINALYSIS, ROUTINE W REFLEX MICROSCOPIC
Glucose, UA: NEGATIVE mg/dL
KETONES UR: 40 mg/dL — AB
Nitrite: NEGATIVE
PROTEIN: NEGATIVE mg/dL
Specific Gravity, Urine: 1.018 (ref 1.005–1.030)
UROBILINOGEN UA: 1 mg/dL (ref 0.0–1.0)
pH: 6 (ref 5.0–8.0)

## 2014-06-26 LAB — CBC
HEMATOCRIT: 34.9 % — AB (ref 36.0–46.0)
Hemoglobin: 11.7 g/dL — ABNORMAL LOW (ref 12.0–15.0)
MCH: 31.9 pg (ref 26.0–34.0)
MCHC: 33.5 g/dL (ref 30.0–36.0)
MCV: 95.1 fL (ref 78.0–100.0)
Platelets: 270 10*3/uL (ref 150–400)
RBC: 3.67 MIL/uL — ABNORMAL LOW (ref 3.87–5.11)
RDW: 12.6 % (ref 11.5–15.5)
WBC: 6.5 10*3/uL (ref 4.0–10.5)

## 2014-06-26 LAB — PROTIME-INR
INR: 1.01 (ref 0.00–1.49)
Prothrombin Time: 13.4 seconds (ref 11.6–15.2)

## 2014-06-26 LAB — APTT: aPTT: 36 seconds (ref 24–37)

## 2014-06-26 LAB — SURGICAL PCR SCREEN
MRSA, PCR: NEGATIVE
Staphylococcus aureus: NEGATIVE

## 2014-06-26 NOTE — Progress Notes (Addendum)
EKG in epic 02/20/2014 ECHO epic 05/25/2006 Clearance note per chart Dr Hilma Favors 06/18/2014 Clearance note per chart Dr Debara Pickett 02/20/2014  OV note in epic 03/02/2014 Dr Lyman Bishop / cardiology OV note epic 02/12/2014 Dr Deberah Castle NP Catalina Gravel

## 2014-06-26 NOTE — Patient Instructions (Signed)
[place]  06/26/2014   Your procedure is scheduled on:     Wednesday July 04, 2014   Report to [facility] Main Entrance and follow signs to  [place] arrive at 6:30 AM.  Call this number if you have problems the morning of surgery [phone] or Presurgical Testing [phone].   Remember:  Do not eat food or drink liquids :After Midnight.  For Living Will and/or Health Care Power Attorney Forms: please provide copy for your medical record, may bring AM of surgery (forms should be already notarized-we do not provide this service).     Take these medicines the morning of surgery with A SIP OF WATER: Citalopram (Celexa);Hydrocodone-Acetaminophen if needed; Pantoprazole (Protonix)                               You may not have any metal on your body including hair pins and piercings  Do not wear jewelry, make-up, lotions, powders, perfumes or deodorant. No Nail [place]  Do not shave body hair  48 hours(2 days) of CHG soap use.               Do not bring valuables to the hospital. [place].  Contacts, dentures or bridgework may not be worn into surgery.  Leave suitcase in the car. After surgery it may be brought to your room.  For patients admitted to the hospital, checkout time is 11:00 AM the day of discharge.     Special Instructions: review fact sheets for MRSA information, Blood Transfusion fact sheet, Incentive Spirometry.  Remember: Type/Screen "Blue armsbands"- may not be removed once applied(would result in being retested AM of surgery, if removed). ________________________________________________________________________  [facility] - Preparing for Surgery Before surgery, you can play an important role.  Because skin is not sterile, your skin needs to be as free of germs as possible.  You can reduce the number of germs on your skin by washing with CHG (chlorahexidine gluconate) soap before surgery.  CHG is an  antiseptic cleaner which kills germs and bonds with the skin to continue killing germs even after washing. Please DO NOT use if you have an allergy to CHG or antibacterial soaps.  If your skin becomes reddened/irritated stop using the CHG and inform your nurse when you arrive at Short Stay. Do not shave (including legs and underarms) for at least 48 hours prior to the first CHG shower.  You may shave your face/neck. Please follow these instructions carefully:  1.  Shower with CHG Soap the night before surgery and the  morning of Surgery.  2.  If you choose to wash your hair, wash your hair first as usual with your  normal  shampoo.  3.  After you shampoo, rinse your hair and body thoroughly to remove the  shampoo.                           4.  Use CHG as you would any other liquid soap.  You can apply chg directly  to the skin and wash                       Gently with a scrungie or clean washcloth.  5.  Apply the CHG Soap to your body ONLY FROM THE NECK DOWN.   Do not use on face/  open                           Wound or open sores. Avoid contact with eyes, ears mouth and genitals (private parts).                       Wash face,  Genitals (private parts) with your normal soap.             6.  Wash thoroughly, paying special attention to the area where your surgery  will be performed.  7.  Thoroughly rinse your body with warm water from the neck down.  8.  DO NOT shower/wash with your normal soap after using and rinsing off  the CHG Soap.                9.  Pat yourself dry with a clean towel.            10.  Wear clean pajamas.            11.  Place clean sheets on your bed the night of your first shower and do not  sleep with pets. Day of Surgery : Do not apply any lotions/deodorants the morning of surgery.  Please wear clean clothes to the hospital/surgery center.  FAILURE TO FOLLOW THESE INSTRUCTIONS MAY RESULT IN THE CANCELLATION OF YOUR SURGERY PATIENT  SIGNATURE_________________________________  NURSE SIGNATURE__________________________________  ________________________________________________________________________   Marilyn Haas  An incentive spirometer is a tool that can help keep your lungs clear and active. This tool measures how well you are filling your lungs with each breath. Taking long deep breaths may help reverse or decrease the chance of developing breathing (pulmonary) problems (especially infection) following:  A long period of time when you are unable to move or be active. BEFORE THE PROCEDURE   If the spirometer includes an indicator to show your best effort, your nurse or respiratory therapist will set it to a desired goal.  If possible, sit up straight or lean slightly forward. Try not to slouch.  Hold the incentive spirometer in an upright position. INSTRUCTIONS FOR USE   Sit on the edge of your bed if possible, or sit up as far as you can in bed or on a chair.  Hold the incentive spirometer in an upright position.  Breathe out normally.  Place the mouthpiece in your mouth and seal your lips tightly around it.  Breathe in slowly and as deeply as possible, raising the piston or the ball toward the top of the column.  Hold your breath for 3-5 seconds or for as long as possible. Allow the piston or ball to fall to the bottom of the column.  Remove the mouthpiece from your mouth and breathe out normally.  Rest for a few seconds and repeat Steps 1 through 7 at least 10 times every 1-2 hours when you are awake. Take your time and take a few normal breaths between deep breaths.  The spirometer may include an indicator to show your best effort. Use the indicator as a goal to work toward during each repetition.  After each set of 10 deep breaths, practice coughing to be sure your lungs are clear. If you have an incision (the cut made at the time of surgery), support your incision when coughing by placing a  pillow or rolled up towels firmly against it. Once you are able to get out of bed, walk around indoors  and cough well. You may stop using the incentive spirometer when instructed by your caregiver.  RISKS AND COMPLICATIONS  Take your time so you do not get dizzy or light-headed.  If you are in pain, you may need to take or ask for pain medication before doing incentive spirometry. It is harder to take a deep breath if you are having pain. AFTER USE  Rest and breathe slowly and easily.  It can be helpful to keep track of a log of your progress. Your caregiver can provide you with a simple table to help with this. If you are using the spirometer at home, follow these instructions: [place] IF:   You are having difficultly using the spirometer.  You have trouble using the spirometer as often as instructed.  Your pain medication is not giving enough relief while using the spirometer.  You develop fever of 100.5 F (38.1 C) or higher. SEEK IMMEDIATE MEDICAL CARE IF:   You cough up bloody sputum that had not been present before.  You develop fever of 102 F (38.9 C) or greater.  You develop worsening pain at or near the incision site. MAKE SURE YOU:   Understand these instructions.  Will watch your condition.  Will get help right away if you are not doing well or get worse. Document Released: 07/13/2006 Document Revised: 05/25/2011 Document Reviewed: 09/13/2006 ExitCare Patient Information 2014 Exit[employer], [place].   ________________________________________________________________________  WHAT IS A BLOOD TRANSFUSION? Blood Transfusion Information  A transfusion is the replacement of blood or some of its parts. Blood is made up of multiple cells which provide different functions.  Red blood cells carry oxygen and are used for blood loss replacement.  White blood cells fight against infection.  Platelets control bleeding.  Plasma helps clot blood.  Other blood  products are available for specialized needs, such as hemophilia or other clotting disorders. BEFORE THE TRANSFUSION  Who gives blood for transfusions?   Healthy volunteers who are fully evaluated to make sure their blood is safe. This is blood bank blood. Transfusion therapy is the safest it has ever been in the practice of medicine. Before blood is taken from a donor, a complete history is taken to make sure that person has no history of diseases nor engages in risky social behavior (examples are intravenous drug use or sexual activity with multiple partners). The donor's travel history is screened to minimize risk of transmitting infections, such as malaria. The donated blood is tested for signs of infectious diseases, such as HIV and hepatitis. The blood is then tested to be sure it is compatible with you in order to minimize the chance of a transfusion reaction. If you or a relative donates blood, this is often done in anticipation of surgery and is not appropriate for emergency situations. It takes many days to process the donated blood. RISKS AND COMPLICATIONS Although transfusion therapy is very safe and saves many lives, the main dangers of transfusion include:   Getting an infectious disease.  Developing a transfusion reaction. This is an allergic reaction to something in the blood you were given. Every precaution is taken to prevent this. The decision to have a blood transfusion has been considered carefully by your caregiver before blood is given. Blood is not given unless the benefits outweigh the risks. AFTER THE TRANSFUSION  Right after receiving a blood transfusion, you will usually feel much better and more energetic. This is especially true if your red blood cells have gotten low (anemic).  The transfusion raises the level of the red blood cells which carry oxygen, and this usually causes an energy increase.  The nurse administering the transfusion will monitor you carefully for  complications. HOME CARE INSTRUCTIONS  No special instructions are needed after a transfusion. You may find your energy is better. Speak with your caregiver about any limitations on activity for underlying diseases you may have. SEEK MEDICAL CARE IF:   Your condition is not improving after your transfusion.  You develop redness or irritation at the intravenous (IV) site. SEEK IMMEDIATE MEDICAL CARE IF:  Any of the following symptoms occur over the next 12 hours:  Shaking chills.  You have a temperature by mouth above 102 F (38.9 C), not controlled by medicine.  Chest, back, or muscle pain.  People around you feel you are not acting correctly or are confused.  Shortness of breath or difficulty breathing.  Dizziness and fainting.  You get a rash or develop hives.  You have a decrease in urine output.  Your urine turns a dark color or changes to pink, red, or brown. Any of the following symptoms occur over the next 10 days:  You have a temperature by mouth above 102 F (38.9 C), not controlled by medicine.  Shortness of breath.  Weakness after normal activity.  The white part of the eye turns yellow (jaundice).  You have a decrease in the amount of urine or are urinating less often.  Your urine turns a dark color or changes to pink, red, or brown. Document Released: 02/28/2000 Document Revised: 05/25/2011 Document Reviewed: 10/17/2007 [facility] Patient Information 2014 [place], [place].  _______________________________________________________________________

## 2014-06-26 NOTE — Progress Notes (Signed)
Your patient has screened at an elevated risk for Obstructive Sleep Apnea using the Stop-Bang Tool during a pre-surgical vist. A score of 4 or greater is an elevated risk. Score of 4. 

## 2014-06-26 NOTE — Progress Notes (Signed)
Pt did show this nurse her right lateral side near hip area. Bruising noted to area. Pt states she had fallen at home. Denies any issues with area an stated she had spoken with [facility] in regards to situation.

## 2014-06-26 NOTE — Progress Notes (Signed)
Urinalysis results per epic per PAT visit 06/26/2014 sent to Dr Wynelle Link

## 2014-07-02 ENCOUNTER — Ambulatory Visit: Payer: Self-pay | Admitting: Orthopedic Surgery

## 2014-07-02 NOTE — H&P (Signed)
[place] DOB: 1941-02-25 Married / Language: English / Race: White Female Date of Admission:  07/04/2014 CC:  Left Hip Pain History of Present Illness  The patient is a 74 year old female who comes in today for a preoperative History and Physical. The patient is scheduled for a left total hip revision to be performed by Dr. Dione Plover. Aluisio, MD at [facility] on 07-04-2014. The patient is a 74 year old female who presents  for follow up of their hip. 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 Symptoms reported today include: pain (with activity). The patient feels that they are doing poorly and report their pain level to be mild to moderate (worse with increased activity). The following medication has been used for pain control: Hydrocodone (as needed). The patient has reported improvement of their symptoms with: rest. She is having pain at all times. It is worse with weightbearing. She is finding it difficult to do things around the house that she likes to do. She is starting to feel depressed because her activity is so limited. She is contemplating having the hip converted to a THA because of the pain. I saw her earlier this year. 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 She's having a lot of groin pain. It is especially worse with ambulation and any weight bearing activity. It occasionally radiates down her thigh. She had a bone scan which did not show any loosening of her cemented femoral stem. There was some activity on the acetabular side consistent with some degeneration there. Her symptoms have all worsened to the point where she is ready to get this fixed. They have been treated conservatively in the past for the above stated problem and despite conservative measures, they continue to have progressive pain and severe functional limitations and dysfunction.  They have failed non-operative management including home exercise, medications. It is felt that they would benefit from undergoing conveersion of previous surgery to total joint replacement. Risks and benefits of the procedure have been discussed with the patient and they elect to proceed with surgery. There are no active contraindications to surgery such as ongoing infection or rapidly progressive neurological disease.  Problem List/Past Medical Pain due to internal orthopedic prosthetic devices, implants and grafts, initial encounter Pain with hip hemiarthroplasty, left Arthralgia of left hip (M25.552) Impaired Hearing Anxiety Disorder Depression Dentures Pneumonia Past History Hiatal Hernia Gastroesophageal Reflux Disease Irritable bowel syndrome Scarlet Fever childhood illness History of Esophageal Strictures History of Blood Transfusion Following the Left Hip Fracture / Hemiarthroplasty Past History of Acute Blood Loss Anemia Anemia  Allergies  Avelox *FLUOROQUINOLONES* heart racing  Family History Diabetes Mellitus mother and sister Heart Disease grandfather mothers side Hypertension sister Cancer grandfather fathers side Congestive Heart Failure mother and sister Depression mother and sister  Social History Exercise Exercises never Alcohol use former drinker Not under pain contract Tobacco use Former smoker. former smoker, 02/17/2013 Tobacco / smoke exposure 02/17/2013: no Pain Contract no No history of drug/alcohol rehab Current work status retired Children 4 Drug/Alcohol Rehab (Previously) no Drug/Alcohol Rehab (Currently) no Number of flights of stairs before winded greater than 5 Marital status married Living situation live with spouse Illicit drug use no  Medication History Simvastatin (Oral) Specific dose unknown - Active. Aspirin EC (81MG  Tablet DR, Oral) Active. CeleXA (Oral) Specific dose unknown -  Active. Hydrocodone-Acetaminophen (Oral) Specific dose unknown -  Active. (as needed) Pantoprazole Sodium (40MG  Tablet DR, Oral) Active.  Past Surgical History Esophageal Dilatation Procedures Left Hip Hemiarthroplasty (Hip Fracture) Date: 2010.  Review of Systems  General Present- Chills (history) and Fatigue. Not Present- Fever, Memory Loss, Night Sweats, Weight Gain and Weight Loss. Skin Not Present- Eczema, Hives, Itching, Lesions and Rash. HEENT Present- Dentures and Tinnitus. Not Present- Double Vision, Headache, Hearing Loss and Visual Loss. Respiratory Not Present- Allergies, Chronic Cough, Coughing up blood, Shortness of breath at rest and Shortness of breath with exertion. Cardiovascular Not Present- Chest Pain, Difficulty Breathing Lying Down, Murmur, Palpitations, Racing/skipping heartbeats and Swelling. Gastrointestinal Present- Difficulty Swallowing. Not Present- Abdominal Pain, Bloody Stool, Constipation, Diarrhea, Heartburn, Jaundice, Loss of appetitie, Nausea and Vomiting. Female Genitourinary Present- Weak urinary stream. Not Present- Blood in Urine, Discharge, Flank Pain, Incontinence, Painful Urination, Urgency, Urinary frequency, Urinary Retention and Urinating at Night. Musculoskeletal Present- Back Pain and Morning Stiffness. Not Present- Joint Pain, Joint Swelling, Muscle Pain, Muscle Weakness and Spasms. Neurological Not Present- Blackout spells, Difficulty with balance, Dizziness, Paralysis, Tremor and Weakness. Psychiatric Not Present- Insomnia.  Vitals  Weight: 131 lb Height: 65in Body Surface Area: 1.65 m Body Mass Index: 21.8 kg/m  BP: 124/80 (Sitting, Right Arm, Standard)  Physical Exam General Mental Status -Alert, cooperative and good historian. General Appearance-pleasant, Not in acute distress. Orientation-Oriented X3. Build & Nutrition-Petite(thin), Well nourished and Well developed.  Head and Neck Head-normocephalic,  atraumatic . Neck Global Assessment - supple, no bruit auscultated on the right, no bruit auscultated on the left.  Eye Pupil - Bilateral-Regular and Round. Motion - Bilateral-EOMI.  ENMT Note: upper and lower dentures  Chest and Lung Exam Auscultation Breath sounds - clear at anterior chest wall and clear at posterior chest wall. Adventitious sounds - No Adventitious sounds.  Cardiovascular Auscultation Rhythm - Regular rate and rhythm. Heart Sounds - S1 WNL and S2 WNL. Murmurs & Other Heart Sounds - Auscultation of the heart reveals - No Murmurs.  Abdomen Palpation/Percussion Tenderness - Abdomen is non-tender to palpation. Rigidity (guarding) - Abdomen is soft. Auscultation Auscultation of the abdomen reveals - Bowel sounds normal.  Female Genitourinary Note: Not done, not pertinent to present illness   Musculoskeletal Note: On exam, she's A&O, in no apparent distress. Left hip can be flexed 100, rotated in 20, out 30, abducted 30 with some discomfort on ROM. She walks with an antalgic gait pattern.  RADIOGRAPHS: Her radiographs show the cemented hemiarthroplasty in good position with no evidence of any femoral loosening. There is metal on bone contact with the unipolar head and the acetabular bone. This is unchanged from earlier in the year.  Assessment & Plan  Pain due to internal orthopedic prosthetic devices, implants and grafts, initial encounter Story: Pain with hip hemiarthroplasty, left Note:Surgical Plans: Left Total Hip Revision - Conversion of Hemiarthroplasty  Disposition: Home  PCP: Office visit is pending at this time. Cards: Dr. Debara Pickett - Patient has been seen preoperatively and felt to be stable for surgery.  IV TXA  Anesthesia Issues: None  Comments:  No DME needs.  Signed electronically by Joelene Millin, III PA-C

## 2014-07-04 ENCOUNTER — Inpatient Hospital Stay (HOSPITAL_COMMUNITY): Payer: Medicare Other

## 2014-07-04 ENCOUNTER — Inpatient Hospital Stay (HOSPITAL_COMMUNITY): Payer: Medicare Other | Admitting: Anesthesiology

## 2014-07-04 ENCOUNTER — Encounter (HOSPITAL_COMMUNITY)
Admission: RE | Disposition: A | Discharge status: Home Health | Payer: Self-pay | Source: Ambulatory Visit | Attending: Orthopedic Surgery

## 2014-07-04 ENCOUNTER — Inpatient Hospital Stay (HOSPITAL_COMMUNITY)
Admission: RE | Admit: 2014-07-04 | Discharge: 2014-07-06 | DRG: 470 | Disposition: A | Discharge status: Home Health | Payer: Medicare Other | Source: Ambulatory Visit | Attending: Orthopedic Surgery | Admitting: Orthopedic Surgery

## 2014-07-04 ENCOUNTER — Encounter (HOSPITAL_COMMUNITY): Payer: Self-pay | Admitting: *Deleted

## 2014-07-04 DIAGNOSIS — T8484XA Pain due to internal orthopedic prosthetic devices, implants and grafts, initial encounter: Principal | ICD-10-CM | POA: Diagnosis present

## 2014-07-04 DIAGNOSIS — Y838 Other surgical procedures as the cause of abnormal reaction of the patient, or of later complication, without mention of misadventure at the time of the procedure: Secondary | ICD-10-CM | POA: Diagnosis present

## 2014-07-04 DIAGNOSIS — Z87891 Personal history of nicotine dependence: Secondary | ICD-10-CM

## 2014-07-04 DIAGNOSIS — F329 Major depressive disorder, single episode, unspecified: Secondary | ICD-10-CM | POA: Diagnosis present

## 2014-07-04 DIAGNOSIS — M25352 Other instability, left hip: Secondary | ICD-10-CM | POA: Diagnosis not present

## 2014-07-04 DIAGNOSIS — K589 Irritable bowel syndrome without diarrhea: Secondary | ICD-10-CM | POA: Diagnosis present

## 2014-07-04 DIAGNOSIS — Z96642 Presence of left artificial hip joint: Secondary | ICD-10-CM | POA: Diagnosis not present

## 2014-07-04 DIAGNOSIS — K219 Gastro-esophageal reflux disease without esophagitis: Secondary | ICD-10-CM | POA: Diagnosis present

## 2014-07-04 DIAGNOSIS — Z888 Allergy status to other drugs, medicaments and biological substances status: Secondary | ICD-10-CM | POA: Diagnosis not present

## 2014-07-04 DIAGNOSIS — Z471 Aftercare following joint replacement surgery: Secondary | ICD-10-CM | POA: Diagnosis not present

## 2014-07-04 DIAGNOSIS — D649 Anemia, unspecified: Secondary | ICD-10-CM | POA: Diagnosis present

## 2014-07-04 DIAGNOSIS — E785 Hyperlipidemia, unspecified: Secondary | ICD-10-CM | POA: Diagnosis present

## 2014-07-04 DIAGNOSIS — F419 Anxiety disorder, unspecified: Secondary | ICD-10-CM | POA: Diagnosis present

## 2014-07-04 DIAGNOSIS — Z96649 Presence of unspecified artificial hip joint: Secondary | ICD-10-CM

## 2014-07-04 DIAGNOSIS — H9190 Unspecified hearing loss, unspecified ear: Secondary | ICD-10-CM | POA: Diagnosis present

## 2014-07-04 DIAGNOSIS — M1990 Unspecified osteoarthritis, unspecified site: Secondary | ICD-10-CM | POA: Diagnosis present

## 2014-07-04 HISTORY — PX: CONVERSION TO TOTAL HIP: SHX5784

## 2014-07-04 LAB — TYPE AND SCREEN
ABO/RH(D): O POS
Antibody Screen: NEGATIVE

## 2014-07-04 SURGERY — CONVERSION, PREVIOUS HIP SURGERY, TO TOTAL HIP ARTHROPLASTY
Anesthesia: Spinal | Site: Hip | Laterality: Left

## 2014-07-04 MED ORDER — BUPIVACAINE HCL (PF) 0.25 % IJ SOLN
INTRAMUSCULAR | Status: DC | PRN
  Administered 2014-07-04: 30 mL

## 2014-07-04 MED ORDER — PROPOFOL 10 MG/ML IV BOLUS
INTRAVENOUS | Status: AC
  Filled 2014-07-04: qty 20

## 2014-07-04 MED ORDER — TRAMADOL HCL 50 MG PO TABS
50.0000 mg | ORAL_TABLET | Freq: Two times a day (BID) | ORAL | Status: DC | PRN
Start: 2014-07-04 — End: 2014-07-06

## 2014-07-04 MED ORDER — PHENYLEPHRINE HCL 10 MG/ML IJ SOLN
INTRAMUSCULAR | Status: DC | PRN
  Administered 2014-07-04 (×3): 80 ug via INTRAVENOUS

## 2014-07-04 MED ORDER — BUPIVACAINE HCL (PF) 0.25 % IJ SOLN
INTRAMUSCULAR | Status: AC
  Filled 2014-07-04: qty 30

## 2014-07-04 MED ORDER — HYOSCYAMINE SULFATE 0.125 MG SL SUBL
0.1250 mg | SUBLINGUAL_TABLET | Freq: Four times a day (QID) | SUBLINGUAL | Status: DC | PRN
Start: 2014-07-04 — End: 2014-07-06
  Filled 2014-07-04: qty 1

## 2014-07-04 MED ORDER — CHLORHEXIDINE GLUCONATE 4 % EX LIQD: 60.0000 mL | Freq: Once | CUTANEOUS | Status: DC

## 2014-07-04 MED ORDER — CEFAZOLIN SODIUM-DEXTROSE 2-3 GM-% IV SOLR: 2.0000 g | INTRAVENOUS | Status: DC

## 2014-07-04 MED ORDER — ACETAMINOPHEN 650 MG RE SUPP: 650.0000 mg | Freq: Four times a day (QID) | RECTAL | Status: DC | PRN

## 2014-07-04 MED ORDER — DEXAMETHASONE SODIUM PHOSPHATE 10 MG/ML IJ SOLN
10.0000 mg | Freq: Once | INTRAMUSCULAR | Status: AC
  Administered 2014-07-05: 10 mg via INTRAVENOUS
  Filled 2014-07-04: qty 1

## 2014-07-04 MED ORDER — SODIUM CHLORIDE 0.9 % IJ SOLN
INTRAMUSCULAR | Status: DC | PRN
  Administered 2014-07-04: 30 mL

## 2014-07-04 MED ORDER — FENTANYL CITRATE (PF) 100 MCG/2ML IJ SOLN
INTRAMUSCULAR | Status: AC
  Filled 2014-07-04: qty 2

## 2014-07-04 MED ORDER — BUPIVACAINE LIPOSOME 1.3 % IJ SUSP
INTRAMUSCULAR | Status: DC | PRN
  Administered 2014-07-04: 20 mL

## 2014-07-04 MED ORDER — DOCUSATE SODIUM 100 MG PO CAPS
100.0000 mg | ORAL_CAPSULE | Freq: Two times a day (BID) | ORAL | Status: DC
  Administered 2014-07-04 – 2014-07-06 (×4): 100 mg via ORAL

## 2014-07-04 MED ORDER — FENTANYL CITRATE (PF) 100 MCG/2ML IJ SOLN
INTRAMUSCULAR | Status: DC | PRN
  Administered 2014-07-04 (×2): 50 ug via INTRAVENOUS

## 2014-07-04 MED ORDER — DEXTROSE-NACL 5-0.9 % IV SOLN: INTRAVENOUS | Status: DC

## 2014-07-04 MED ORDER — DEXAMETHASONE SODIUM PHOSPHATE 10 MG/ML IJ SOLN
INTRAMUSCULAR | Status: AC
  Filled 2014-07-04: qty 1

## 2014-07-04 MED ORDER — CEFAZOLIN SODIUM-DEXTROSE 2-3 GM-% IV SOLR
2.0000 g | Freq: Four times a day (QID) | INTRAVENOUS | Status: AC
  Administered 2014-07-04 (×2): 2 g via INTRAVENOUS
  Filled 2014-07-04 (×2): qty 50

## 2014-07-04 MED ORDER — ACETAMINOPHEN 325 MG PO TABS: 650.0000 mg | ORAL_TABLET | Freq: Four times a day (QID) | ORAL | Status: DC | PRN

## 2014-07-04 MED ORDER — METOCLOPRAMIDE HCL 10 MG PO TABS: 5.0000 mg | ORAL_TABLET | Freq: Three times a day (TID) | ORAL | Status: DC | PRN

## 2014-07-04 MED ORDER — CEFAZOLIN SODIUM-DEXTROSE 2-3 GM-% IV SOLR
INTRAVENOUS | Status: AC
  Filled 2014-07-04: qty 50

## 2014-07-04 MED ORDER — POLYETHYLENE GLYCOL 3350 17 G PO PACK: 17.0000 g | PACK | Freq: Every day | ORAL | Status: DC | PRN

## 2014-07-04 MED ORDER — BUPIVACAINE LIPOSOME 1.3 % IJ SUSP
20.0000 mL | Freq: Once | INTRAMUSCULAR | Status: DC
  Filled 2014-07-04: qty 20

## 2014-07-04 MED ORDER — 0.9 % SODIUM CHLORIDE (POUR BTL) OPTIME
TOPICAL | Status: DC | PRN
  Administered 2014-07-04: 1000 mL

## 2014-07-04 MED ORDER — CITALOPRAM HYDROBROMIDE 40 MG PO TABS
40.0000 mg | ORAL_TABLET | Freq: Every day | ORAL | Status: DC
  Administered 2014-07-04 – 2014-07-06 (×3): 40 mg via ORAL
  Filled 2014-07-04 (×3): qty 1

## 2014-07-04 MED ORDER — ALPRAZOLAM 0.5 MG PO TABS
0.5000 mg | ORAL_TABLET | Freq: Every evening | ORAL | Status: DC | PRN
  Administered 2014-07-04 – 2014-07-05 (×2): 0.5 mg via ORAL
  Filled 2014-07-04 (×2): qty 1

## 2014-07-04 MED ORDER — METHOCARBAMOL 1000 MG/10ML IJ SOLN
500.0000 mg | Freq: Four times a day (QID) | INTRAVENOUS | Status: DC | PRN
  Administered 2014-07-04 – 2014-07-05 (×2): 500 mg via INTRAVENOUS
  Filled 2014-07-04 (×5): qty 5

## 2014-07-04 MED ORDER — PROPOFOL INFUSION 10 MG/ML OPTIME
INTRAVENOUS | Status: DC | PRN
  Administered 2014-07-04: 100 ug/kg/min via INTRAVENOUS

## 2014-07-04 MED ORDER — RIVAROXABAN 10 MG PO TABS
10.0000 mg | ORAL_TABLET | Freq: Every day | ORAL | Status: DC
  Administered 2014-07-05 – 2014-07-06 (×2): 10 mg via ORAL
  Filled 2014-07-04 (×3): qty 1

## 2014-07-04 MED ORDER — ONDANSETRON HCL 4 MG PO TABS: 4.0000 mg | ORAL_TABLET | Freq: Four times a day (QID) | ORAL | Status: DC | PRN

## 2014-07-04 MED ORDER — DEXAMETHASONE SODIUM PHOSPHATE 10 MG/ML IJ SOLN
10.0000 mg | Freq: Once | INTRAMUSCULAR | Status: AC
  Administered 2014-07-04: 10 mg via INTRAVENOUS

## 2014-07-04 MED ORDER — PHENYLEPHRINE 40 MCG/ML (10ML) SYRINGE FOR IV PUSH (FOR BLOOD PRESSURE SUPPORT)
PREFILLED_SYRINGE | INTRAVENOUS | Status: AC
  Filled 2014-07-04: qty 10

## 2014-07-04 MED ORDER — METHOCARBAMOL 500 MG PO TABS
500.0000 mg | ORAL_TABLET | Freq: Four times a day (QID) | ORAL | Status: DC | PRN
  Filled 2014-07-04: qty 1

## 2014-07-04 MED ORDER — METOCLOPRAMIDE HCL 5 MG/ML IJ SOLN: 5.0000 mg | Freq: Three times a day (TID) | INTRAMUSCULAR | Status: DC | PRN

## 2014-07-04 MED ORDER — PHENOL 1.4 % MT LIQD: 1.0000 | OROMUCOSAL | Status: DC | PRN

## 2014-07-04 MED ORDER — PANTOPRAZOLE SODIUM 40 MG PO TBEC
40.0000 mg | DELAYED_RELEASE_TABLET | Freq: Every day | ORAL | Status: DC
  Administered 2014-07-05 – 2014-07-06 (×2): 40 mg via ORAL
  Filled 2014-07-04 (×3): qty 1

## 2014-07-04 MED ORDER — MORPHINE SULFATE 2 MG/ML IJ SOLN: 1.0000 mg | INTRAMUSCULAR | Status: DC | PRN

## 2014-07-04 MED ORDER — FLEET ENEMA 7-19 GM/118ML RE ENEM: 1.0000 | ENEMA | Freq: Once | RECTAL | Status: AC | PRN

## 2014-07-04 MED ORDER — MIDAZOLAM HCL 2 MG/2ML IJ SOLN
INTRAMUSCULAR | Status: AC
  Filled 2014-07-04: qty 2

## 2014-07-04 MED ORDER — SODIUM CHLORIDE 0.9 % IJ SOLN
INTRAMUSCULAR | Status: AC
  Filled 2014-07-04: qty 50

## 2014-07-04 MED ORDER — ONDANSETRON HCL 4 MG/2ML IJ SOLN
INTRAMUSCULAR | Status: DC | PRN
  Administered 2014-07-04: 4 mg via INTRAVENOUS

## 2014-07-04 MED ORDER — HYDROMORPHONE HCL 1 MG/ML IJ SOLN: 0.2500 mg | INTRAMUSCULAR | Status: DC | PRN

## 2014-07-04 MED ORDER — ONDANSETRON HCL 4 MG/2ML IJ SOLN
INTRAMUSCULAR | Status: AC
  Filled 2014-07-04: qty 2

## 2014-07-04 MED ORDER — MENTHOL 3 MG MT LOZG: 1.0000 | LOZENGE | OROMUCOSAL | Status: DC | PRN

## 2014-07-04 MED ORDER — ONDANSETRON HCL 4 MG/2ML IJ SOLN: 4.0000 mg | Freq: Four times a day (QID) | INTRAMUSCULAR | Status: DC | PRN

## 2014-07-04 MED ORDER — DIPHENHYDRAMINE HCL 12.5 MG/5ML PO ELIX: 12.5000 mg | ORAL_SOLUTION | ORAL | Status: DC | PRN

## 2014-07-04 MED ORDER — ACETAMINOPHEN 10 MG/ML IV SOLN
1000.0000 mg | Freq: Once | INTRAVENOUS | Status: AC
  Administered 2014-07-04: 1000 mg via INTRAVENOUS
  Filled 2014-07-04: qty 100

## 2014-07-04 MED ORDER — BISACODYL 10 MG RE SUPP: 10.0000 mg | Freq: Every day | RECTAL | Status: DC | PRN

## 2014-07-04 MED ORDER — OXYCODONE HCL 5 MG PO TABS
5.0000 mg | ORAL_TABLET | ORAL | Status: DC | PRN
  Administered 2014-07-04 – 2014-07-06 (×12): 10 mg via ORAL
  Filled 2014-07-04 (×12): qty 2

## 2014-07-04 MED ORDER — SODIUM CHLORIDE 0.9 % IV SOLN: INTRAVENOUS | Status: DC

## 2014-07-04 MED ORDER — TRANEXAMIC ACID 1000 MG/10ML IV SOLN
1000.0000 mg | INTRAVENOUS | Status: AC
  Administered 2014-07-04: 1000 mg via INTRAVENOUS
  Filled 2014-07-04: qty 10

## 2014-07-04 MED ORDER — MIDAZOLAM HCL 5 MG/5ML IJ SOLN
INTRAMUSCULAR | Status: DC | PRN
  Administered 2014-07-04 (×2): 0.5 mg via INTRAVENOUS
  Administered 2014-07-04: 1 mg via INTRAVENOUS

## 2014-07-04 MED ORDER — SIMVASTATIN 40 MG PO TABS
40.0000 mg | ORAL_TABLET | Freq: Every day | ORAL | Status: DC
  Administered 2014-07-04: 40 mg via ORAL
  Filled 2014-07-04 (×4): qty 1

## 2014-07-04 MED ORDER — LACTATED RINGERS IV SOLN
INTRAVENOUS | Status: DC | PRN
  Administered 2014-07-04 (×2): via INTRAVENOUS

## 2014-07-04 MED ORDER — ACETAMINOPHEN 500 MG PO TABS
1000.0000 mg | ORAL_TABLET | Freq: Four times a day (QID) | ORAL | Status: AC
  Administered 2014-07-04 – 2014-07-05 (×2): 1000 mg via ORAL
  Filled 2014-07-04 (×3): qty 2

## 2014-07-04 SURGICAL SUPPLY — 54 items
BAG ZIPLOCK 12X15 (MISCELLANEOUS) IMPLANT
BIT DRILL 2.8X128 (BIT) ×2 IMPLANT
BLADE EXTENDED COATED 6.5IN (ELECTRODE) ×2 IMPLANT
BLADE SAW SAG 73X25 THK (BLADE) ×1
BLADE SAW SGTL 73X25 THK (BLADE) ×1 IMPLANT
CUP ACET PINNACLE SECTR 50MM (Hips) ×1 IMPLANT
DECANTER SPIKE VIAL GLASS SM (MISCELLANEOUS) ×2 IMPLANT
DRAPE INCISE IOBAN 66X45 STRL (DRAPES) ×2 IMPLANT
DRAPE ORTHO SPLIT 77X108 STRL (DRAPES) ×2
DRAPE POUCH INSTRU U-SHP 10X18 (DRAPES) ×2 IMPLANT
DRAPE SURG ORHT 6 SPLT 77X108 (DRAPES) ×2 IMPLANT
DRAPE U-SHAPE 47X51 STRL (DRAPES) ×2 IMPLANT
DRSG ADAPTIC 3X8 NADH LF (GAUZE/BANDAGES/DRESSINGS) ×2 IMPLANT
DRSG MEPILEX BORDER 4X4 (GAUZE/BANDAGES/DRESSINGS) ×2 IMPLANT
DRSG MEPILEX BORDER 4X8 (GAUZE/BANDAGES/DRESSINGS) ×2 IMPLANT
DURAPREP 26ML APPLICATOR (WOUND CARE) ×2 IMPLANT
ELECT REM PT RETURN 9FT ADLT (ELECTROSURGICAL) ×2
ELECTRODE REM PT RTRN 9FT ADLT (ELECTROSURGICAL) ×1 IMPLANT
EVACUATOR 1/8 PVC DRAIN (DRAIN) ×2 IMPLANT
FACESHIELD WRAPAROUND (MASK) ×8 IMPLANT
GAUZE SPONGE 4X4 12PLY STRL (GAUZE/BANDAGES/DRESSINGS) ×2 IMPLANT
GLOVE BIO SURGEON STRL SZ7.5 (GLOVE) ×2 IMPLANT
GLOVE BIO SURGEON STRL SZ8 (GLOVE) ×2 IMPLANT
GLOVE BIOGEL PI IND STRL 8 (GLOVE) ×2 IMPLANT
GLOVE BIOGEL PI INDICATOR 8 (GLOVE) ×2
GOWN STRL REUS W/TWL LRG LVL3 (GOWN DISPOSABLE) ×2 IMPLANT
GOWN STRL REUS W/TWL XL LVL3 (GOWN DISPOSABLE) ×2 IMPLANT
HEAD FEM STD 32X+9 STRL (Hips) ×2 IMPLANT
IMMOBILIZER KNEE 20 (SOFTGOODS) ×2
IMMOBILIZER KNEE 20 THIGH 36 (SOFTGOODS) ×1 IMPLANT
KIT BASIN OR (CUSTOM PROCEDURE TRAY) ×2 IMPLANT
LINER MARATHON 32 50 (Hips) ×2 IMPLANT
MANIFOLD NEPTUNE II (INSTRUMENTS) ×2 IMPLANT
NDL SAFETY ECLIPSE 18X1.5 (NEEDLE) ×1 IMPLANT
NEEDLE HYPO 18GX1.5 SHARP (NEEDLE) ×1
NS IRRIG 1000ML POUR BTL (IV SOLUTION) ×2 IMPLANT
PACK TOTAL JOINT (CUSTOM PROCEDURE TRAY) ×2 IMPLANT
PADDING CAST COTTON 6X4 STRL (CAST SUPPLIES) ×2 IMPLANT
PASSER SUT SWANSON 36MM LOOP (INSTRUMENTS) ×2 IMPLANT
PINNACLE SECTOR CUP 50MM (Hips) ×2 IMPLANT
POSITIONER SURGICAL ARM (MISCELLANEOUS) ×2 IMPLANT
STRIP CLOSURE SKIN 1/2X4 (GAUZE/BANDAGES/DRESSINGS) ×2 IMPLANT
SUT ETHIBOND NAB CT1 #1 30IN (SUTURE) ×4 IMPLANT
SUT MNCRL AB 4-0 PS2 18 (SUTURE) ×2 IMPLANT
SUT VIC AB 1 CT1 27 (SUTURE) ×3
SUT VIC AB 1 CT1 27XBRD ANTBC (SUTURE) ×3 IMPLANT
SUT VIC AB 2-0 CT1 27 (SUTURE) ×3
SUT VIC AB 2-0 CT1 TAPERPNT 27 (SUTURE) ×3 IMPLANT
SUT VLOC 180 0 24IN GS25 (SUTURE) ×2 IMPLANT
SYR 50ML LL SCALE MARK (SYRINGE) ×2 IMPLANT
TOWEL OR 17X26 10 PK STRL BLUE (TOWEL DISPOSABLE) ×4 IMPLANT
TOWEL OR NON WOVEN STRL DISP B (DISPOSABLE) ×2 IMPLANT
TRAY FOLEY W/METER SILVER 14FR (SET/KITS/TRAYS/PACK) ×2 IMPLANT
WATER STERILE IRR 1500ML POUR (IV SOLUTION) ×2 IMPLANT

## 2014-07-04 NOTE — H&P (View-Only) (Signed)
[place] DOB: 1940-03-26 Married / Language: English / Race: White Female Date of Admission:  07/04/2014 CC:  Left Hip Pain History of Present Illness  The patient is a 74 year old female who comes in today for a preoperative History and Physical. The patient is scheduled for a left total hip revision to be performed by Dr. Dione Plover. Aluisio, MD at [facility] on 07-04-2014. The patient is a 74 year old female who presents  for follow up of their hip. 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 Symptoms reported today include: pain (with activity). The patient feels that they are doing poorly and report their pain level to be mild to moderate (worse with increased activity). The following medication has been used for pain control: Hydrocodone (as needed). The patient has reported improvement of their symptoms with: rest. She is having pain at all times. It is worse with weightbearing. She is finding it difficult to do things around the house that she likes to do. She is starting to feel depressed because her activity is so limited. She is contemplating having the hip converted to a THA because of the pain. I saw her earlier this year. 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 She's having a lot of groin pain. It is especially worse with ambulation and any weight bearing activity. It occasionally radiates down her thigh. She had a bone scan which did not show any loosening of her cemented femoral stem. There was some activity on the acetabular side consistent with some degeneration there. Her symptoms have all worsened to the point where she is ready to get this fixed. They have been treated conservatively in the past for the above stated problem and despite conservative measures, they continue to have progressive pain and severe functional limitations and dysfunction.  They have failed non-operative management including home exercise, medications. It is felt that they would benefit from undergoing conveersion of previous surgery to total joint replacement. Risks and benefits of the procedure have been discussed with the patient and they elect to proceed with surgery. There are no active contraindications to surgery such as ongoing infection or rapidly progressive neurological disease.  Problem List/Past Medical Pain due to internal orthopedic prosthetic devices, implants and grafts, initial encounter Pain with hip hemiarthroplasty, left Arthralgia of left hip (M25.552) Impaired Hearing Anxiety Disorder Depression Dentures Pneumonia Past History Hiatal Hernia Gastroesophageal Reflux Disease Irritable bowel syndrome Scarlet Fever childhood illness History of Esophageal Strictures History of Blood Transfusion Following the Left Hip Fracture / Hemiarthroplasty Past History of Acute Blood Loss Anemia Anemia  Allergies  Avelox *FLUOROQUINOLONES* heart racing  Family History Diabetes Mellitus mother and sister Heart Disease grandfather mothers side Hypertension sister Cancer grandfather fathers side Congestive Heart Failure mother and sister Depression mother and sister  Social History Exercise Exercises never Alcohol use former drinker Not under pain contract Tobacco use Former smoker. former smoker, 02/17/2013 Tobacco / smoke exposure 02/17/2013: no Pain Contract no No history of drug/alcohol rehab Current work status retired Children 4 Drug/Alcohol Rehab (Previously) no Drug/Alcohol Rehab (Currently) no Number of flights of stairs before winded greater than 5 Marital status married Living situation live with spouse Illicit drug use no  Medication History Simvastatin (Oral) Specific dose unknown - Active. Aspirin EC (81MG  Tablet DR, Oral) Active. CeleXA (Oral) Specific dose unknown -  Active. Hydrocodone-Acetaminophen (Oral) Specific dose unknown -  Active. (as needed) Pantoprazole Sodium (40MG  Tablet DR, Oral) Active.  Past Surgical History Esophageal Dilatation Procedures Left Hip Hemiarthroplasty (Hip Fracture) Date: 2010.  Review of Systems  General Present- Chills (history) and Fatigue. Not Present- Fever, Memory Loss, Night Sweats, Weight Gain and Weight Loss. Skin Not Present- Eczema, Hives, Itching, Lesions and Rash. HEENT Present- Dentures and Tinnitus. Not Present- Double Vision, Headache, Hearing Loss and Visual Loss. Respiratory Not Present- Allergies, Chronic Cough, Coughing up blood, Shortness of breath at rest and Shortness of breath with exertion. Cardiovascular Not Present- Chest Pain, Difficulty Breathing Lying Down, Murmur, Palpitations, Racing/skipping heartbeats and Swelling. Gastrointestinal Present- Difficulty Swallowing. Not Present- Abdominal Pain, Bloody Stool, Constipation, Diarrhea, Heartburn, Jaundice, Loss of appetitie, Nausea and Vomiting. Female Genitourinary Present- Weak urinary stream. Not Present- Blood in Urine, Discharge, Flank Pain, Incontinence, Painful Urination, Urgency, Urinary frequency, Urinary Retention and Urinating at Night. Musculoskeletal Present- Back Pain and Morning Stiffness. Not Present- Joint Pain, Joint Swelling, Muscle Pain, Muscle Weakness and Spasms. Neurological Not Present- Blackout spells, Difficulty with balance, Dizziness, Paralysis, Tremor and Weakness. Psychiatric Not Present- Insomnia.  Vitals  Weight: 131 lb Height: 65in Body Surface Area: 1.65 m Body Mass Index: 21.8 kg/m  BP: 124/80 (Sitting, Right Arm, Standard)  Physical Exam General Mental Status -Alert, cooperative and good historian. General Appearance-pleasant, Not in acute distress. Orientation-Oriented X3. Build & Nutrition-Petite(thin), Well nourished and Well developed.  Head and Neck Head-normocephalic,  atraumatic . Neck Global Assessment - supple, no bruit auscultated on the right, no bruit auscultated on the left.  Eye Pupil - Bilateral-Regular and Round. Motion - Bilateral-EOMI.  ENMT Note: upper and lower dentures  Chest and Lung Exam Auscultation Breath sounds - clear at anterior chest wall and clear at posterior chest wall. Adventitious sounds - No Adventitious sounds.  Cardiovascular Auscultation Rhythm - Regular rate and rhythm. Heart Sounds - S1 WNL and S2 WNL. Murmurs & Other Heart Sounds - Auscultation of the heart reveals - No Murmurs.  Abdomen Palpation/Percussion Tenderness - Abdomen is non-tender to palpation. Rigidity (guarding) - Abdomen is soft. Auscultation Auscultation of the abdomen reveals - Bowel sounds normal.  Female Genitourinary Note: Not done, not pertinent to present illness   Musculoskeletal Note: On exam, she's A&O, in no apparent distress. Left hip can be flexed 100, rotated in 20, out 30, abducted 30 with some discomfort on ROM. She walks with an antalgic gait pattern.  RADIOGRAPHS: Her radiographs show the cemented hemiarthroplasty in good position with no evidence of any femoral loosening. There is metal on bone contact with the unipolar head and the acetabular bone. This is unchanged from earlier in the year.  Assessment & Plan  Pain due to internal orthopedic prosthetic devices, implants and grafts, initial encounter Story: Pain with hip hemiarthroplasty, left Note:Surgical Plans: Left Total Hip Revision - Conversion of Hemiarthroplasty  Disposition: Home  PCP: Office visit is pending at this time. Cards: Dr. Debara Pickett - Patient has been seen preoperatively and felt to be stable for surgery.  IV TXA  Anesthesia Issues: None  Comments:  No DME needs.  Signed electronically by Joelene Millin, III PA-C

## 2014-07-04 NOTE — Transfer of Care (Signed)
Immediate Anesthesia Transfer of Care Note  Patient: Marilyn Haas  Procedure(s) Performed: Procedure(s): LEFT CONVERSION TO TOTAL HIP ARTHROPLASTY (Left)  Patient Location: PACU  Anesthesia Type:SpinalT10  Level of Consciousness:  sedated, patient cooperative and responds to stimulation  Airway & Oxygen Therapy:Patient Spontanous Breathing and Patient connected to face mask oxgen  Post-op Assessment:  Report given to PACU RN and Post -op Vital signs reviewed and stable  Post vital signs:  Reviewed and stable  Last Vitals:  Filed Vitals:   07/04/14 0615  BP: 150/68  Pulse: 80  Temp: 36.7 C  Resp: 16    Complications: No apparent anesthesia complications

## 2014-07-04 NOTE — Brief Op Note (Signed)
07/04/2014  9:39 AM  PATIENT:  [place]  74 y.o. female  PRE-OPERATIVE DIAGNOSIS:  PAINFUL LEFT HIP UNIPOLAR ARTHROPLASTY  POST-OPERATIVE DIAGNOSIS:  PAINFUL LEFT HIP UNIPOLAR ARTHROPLASTY  PROCEDURE:  CONVERSION OF PREVIOUS HIP SURGERY TO LEFT TOTAL HIP ARTHROPLASTY  SURGEON:  Surgeon(s) and Role:    * Gaynelle Arabian, MD - Primary  PHYSICIAN ASSISTANT:   ASSISTANTS: Arlee Muslim, PA-C   ANESTHESIA:   spinal  EBL:  Total I/O In: 1300 [I.V.:1300] Out: 350 [Urine:200; Blood:150]  BLOOD ADMINISTERED:none  DRAINS: none   LOCAL MEDICATIONS USED:  OTHER Exparel  COUNTS:  YES  TOURNIQUET:  * No tourniquets in log *  DICTATION: .Other Dictation: Dictation Number [id number]  PLAN OF CARE: Admit to inpatient   PATIENT DISPOSITION:  PACU - hemodynamically stable.

## 2014-07-04 NOTE — Anesthesia Postprocedure Evaluation (Signed)
  Anesthesia Post-op Note  Patient: Marilyn Haas  Procedure(s) Performed: Procedure(s) (LRB): LEFT CONVERSION TO TOTAL HIP ARTHROPLASTY (Left)  Patient Location: PACU  Anesthesia Type: Spinal  Level of Consciousness: awake and alert   Airway and Oxygen Therapy: Patient Spontanous Breathing  Post-op Pain: mild  Post-op Assessment: Post-op Vital signs reviewed, Patient's Cardiovascular Status Stable, Respiratory Function Stable, Patent Airway and No signs of Nausea or vomiting  Last Vitals:  Filed Vitals:   07/04/14 1157  BP: 140/63  Pulse: 61  Temp: 36.4 C  Resp:     Post-op Vital Signs: stable   Complications: No apparent anesthesia complications

## 2014-07-04 NOTE — Progress Notes (Signed)
On arrival to PACU , confused to location and events. Oriented to person and birth date. Asking where she is and what happened. Tried to orient to place and events. For off look on face. Able to determine spinal level. Looking at lt hip. Dr. Delma Post in to see. Aware of confusion. Will monitor.

## 2014-07-04 NOTE — Progress Notes (Signed)
Utilization review completed.  

## 2014-07-04 NOTE — Interval H&P Note (Signed)
History and Physical Interval Note:  07/04/2014 8:18 AM  [employer]  has presented today for surgery, with the diagnosis of PAINFUL LEFT HIP UNIPOLAR ARTHROPLASTY  The various methods of treatment have been discussed with the patient and family. After consideration of risks, benefits and other options for treatment, the patient has consented to  Procedure(s): LEFT CONVERSION TO TOTAL HIP (Left) as a surgical intervention .  The patient's history has been reviewed, patient examined, no change in status, stable for surgery.  I have reviewed the patient's chart and labs.  Questions were answered to the patient's satisfaction.     Gearlean Alf

## 2014-07-04 NOTE — Op Note (Signed)
NAMEKENNYA, Haas             ACCOUNT NO.:  [id number]  MEDICAL RECORD NO.:  [id number]  LOCATION:  1761                         FACILITY:  [facility]  PHYSICIAN:  Gaynelle Arabian, M.D.    DATE OF BIRTH:  1941-01-04  DATE OF PROCEDURE:  07/04/2014 DATE OF DISCHARGE:                              OPERATIVE REPORT   PREOPERATIVE DIAGNOSIS:  Painful left hip unipolar hemiarthroplasty.  POSTOPERATIVE DIAGNOSIS:  Painful left hip unipolar hemiarthroplasty.  PROCEDURE:  Conversion of previous hip surgery to left total hip arthroplasty.  SURGEON:  Gaynelle Arabian, MD.  ASSISTANT:  Alexzandrew L. Perkins, PA-C.  ANESTHESIA:  Spinal.  ESTIMATED BLOOD LOSS:  150 mL.  DRAINS:  Hemovac x1.  COMPLICATIONS:  None.  CONDITION:  Stable to recovery.  BRIEF CLINICAL NOTE:  Ms. Marilyn Haas is a 74 year old female who had left hip hemiarthroplasty done for femoral neck fracture several years ago. She had progressively worsening groin and thigh pain.  It appears though the bipolar component has eroded through the acetabular cartilage.  She had a bone scan done which did not show any loosening of the cemented femoral stem.  She presents now for conversion of previous hip surgery to total hip arthroplasty.  PROCEDURE IN DETAIL:  After successful administration of spinal anesthetic, the patient was placed in the right lateral decubitus position with the left side up and held with the hip positioner.  The left lower extremity was isolated from her perineum with plastic drapes and prepped and draped in usual sterile fashion.  A short posterolateral incision was made with a 10 blade through subcutaneous tissue to the fascia lata which was incised in line with the skin incision.  Sciatic nerve was palpated and protected, and then the pseudocapsule was incised and removed off the femur.  The hip was then dislocated.  The unipolar head was removed from the femoral stem.  The stem was in good position. It  was solidly fixed and the version was about 20 degrees of anteversion.  We then retracted the femur anteriorly to gain acetabular exposure.  We obtained excellent exposure of the acetabulum.  Acetabular retractors were placed and labrum removed.  The acetabular cartilage was essentially gone centrally and had eroded the bone.  We started reaming the acetabulum at 45 mm coursing up to 49 mm.  A 50 mm Pinnacle acetabular shell was then placed in anatomic position with outstanding purchase.  Trial 32 mm neutral +4 marathon liner was placed.  Trial femoral head was then placed onto the stem.  This was a 32+ 1 head at first, but it  did not have appropriate soft tissue tension, so we went to 32+ 9 which had appropriate soft tissue tension.  The hip was reduced with excellent stability, full extension, full external rotation, 70 degrees flexion, 40 degrees adduction, and about 70 degrees internal rotation and 90 degrees of flexion and about 70 degrees of internal rotation.  By placing the left leg on top the right, the leg lengths were found to be equal.  The hip was then dislocated and trials removed. Permanent 32 mm neutral +4 marathon liner was placed into the 50 mm acetabular shell and a 32+ 9 metal head  was placed onto the femoral stem.  Hip was then reduced with same stability parameters.  Wound was copiously irrigated with saline solution.  Capsule reattached to the femur through drill holes with Ethibond suture.  A 20 mL of Exparel mixed with 30 mL of saline then injected into the fascia lata, gluteal muscles, and subcu tissues.  Additional 20 mL of 0.25% Marcaine was injected into the same tissues.  Fascia lata was closed over Hemovac drain with a running #1 V-Loc suture.  The subcu was closed with interrupted 2-0 Vicryl and subcuticular running 4-0 Monocryl.  Drains hooked to suction.  Incision cleaned and dried, and Steri-Strips and a bulky sterile dressing applied.  She was placed  into a knee immobilizer awakened and transported to recovery in stable condition.     Gaynelle Arabian, M.D.     FA/MEDQ  D:  07/04/2014  T:  07/04/2014  Job:  [id number]

## 2014-07-04 NOTE — Anesthesia Procedure Notes (Signed)
Spinal Patient location during procedure: OR Start time: 07/04/2014 8:25 AM End time: 07/04/2014 8:37 AM Staffing Anesthesiologist: Franne Grip Resident/CRNA: Janki Dike Performed by: anesthesiologist  Preanesthetic Checklist Completed: patient identified, site marked, surgical consent, pre-op evaluation, timeout performed, IV checked, risks and benefits discussed and monitors and equipment checked Spinal Block Patient position: sitting Prep: Betadine Patient monitoring: heart rate, cardiac monitor, continuous pulse ox and blood pressure Approach: midline Location: L3-4 Injection technique: single-shot Needle Needle type: Spinocan  Needle gauge: 25 G Needle length: 9 cm Needle insertion depth: 6 cm Assessment Sensory level: T10 Additional Notes -Heme, -Para, VSS.   Lot [id number] 2017/10

## 2014-07-04 NOTE — Progress Notes (Signed)
"  I'm beginning to wake up." More clear on surroundings and events. Asking for dentures. Retrieved from from family and pt put in place. Eyes more focused and asking appropriate questions.

## 2014-07-04 NOTE — Anesthesia Preprocedure Evaluation (Addendum)
Anesthesia Evaluation  Patient identified by MRN, date of birth, ID band Patient awake    Reviewed: Allergy & Precautions, NPO status , Patient's Chart, lab work & pertinent test results  Airway Mallampati: II  TM Distance: >3 FB Neck ROM: Full    Dental no notable dental hx.    Pulmonary asthma , pneumonia -, resolved, former smoker,  breath sounds clear to auscultation  Pulmonary exam normal       Cardiovascular Exercise Tolerance: Good hypertension, Rhythm:Regular Rate:Normal  ECHO 2008: Normal LV   Neuro/Psych PSYCHIATRIC DISORDERS Anxiety Depression  Neuromuscular disease    GI/Hepatic Neg liver ROS, hiatal hernia, GERD-  Medicated,  Endo/Other  negative endocrine ROS  Renal/GU negative Renal ROS  negative genitourinary   Musculoskeletal  (+) Arthritis -,   Abdominal   Peds negative pediatric ROS (+)  Hematology  (+) anemia ,   Anesthesia Other Findings   Reproductive/Obstetrics negative OB ROS                            Anesthesia Physical Anesthesia Plan  ASA: II  Anesthesia Plan: Spinal   Post-op Pain Management:    Induction: Intravenous  Airway Management Planned:   Additional Equipment:   Intra-op Plan:   Post-operative Plan: Extubation in OR  Informed Consent: I have reviewed the patients History and Physical, chart, labs and discussed the procedure including the risks, benefits and alternatives for the proposed anesthesia with the patient or authorized representative who has indicated his/her understanding and acceptance.   Dental advisory given  Plan Discussed with: CRNA  Anesthesia Plan Comments: (Discussed risks and benefits of and differences between spinal and general. Discussed risks of spinal including headache, backache, failure, bleeding, infection, and nerve damage. Patient consents to spinal. Questions answered. Coagulation studies and platelet count  acceptable.)       Anesthesia Quick Evaluation

## 2014-07-05 LAB — BASIC METABOLIC PANEL
Anion gap: 7 (ref 5–15)
BUN: 11 mg/dL (ref 6–23)
CALCIUM: 8.2 mg/dL — AB (ref 8.4–10.5)
CO2: 26 mmol/L (ref 19–32)
CREATININE: 0.77 mg/dL (ref 0.50–1.10)
Chloride: 97 mmol/L (ref 96–112)
GFR calc Af Amer: >90 mL/min (ref >90)
GFR, EST NON AFRICAN AMERICAN: 80 mL/min — AB (ref >90)
GLUCOSE: 148 mg/dL — AB (ref 70–99)
Potassium: 3.9 mmol/L (ref 3.5–5.1)
Sodium: 130 mmol/L — ABNORMAL LOW (ref 135–145)

## 2014-07-05 LAB — CBC
HCT: 24.9 % — ABNORMAL LOW (ref 36.0–46.0)
Hemoglobin: 9 g/dL — ABNORMAL LOW (ref 12.0–15.0)
MCH: 34.4 pg — ABNORMAL HIGH (ref 26.0–34.0)
MCHC: 36.1 g/dL — ABNORMAL HIGH (ref 30.0–36.0)
MCV: 95 fL (ref 78.0–100.0)
Platelets: 191 10*3/uL (ref 150–400)
RBC: 2.62 MIL/uL — ABNORMAL LOW (ref 3.87–5.11)
RDW: 12.7 % (ref 11.5–15.5)
WBC: 9.7 10*3/uL (ref 4.0–10.5)

## 2014-07-05 NOTE — Evaluation (Signed)
Occupational Therapy Evaluation Patient Details Name: CARLINE DURA MRN: [id number] DOB: [birth year] Today's Date: 07/05/2014    History of Present Illness LEFT CONVERSION TO TOTAL HIP ARTHROPLASTY    Clinical Impression   Pt is s/p THA resulting in the deficits listed below (see OT Problem List).  Pt will benefit from skilled OT to increase their safety and independence with ADL and functional mobility for ADL to facilitate discharge to venue listed below.      Follow Up Recommendations  Home health OT    Equipment Recommendations  3 in 1 bedside comode;Other (comment) (pt will decide- may have one)       Precautions / Restrictions Precautions Precautions: Posterior Hip;Fall      Mobility Bed Mobility               General bed mobility comments: pt in chair  Transfers Overall transfer level: Needs assistance Equipment used: Rolling walker (2 wheeled) Transfers: Sit to/from Stand Sit to Stand: Min assist         General transfer comment: Vc for hand placement         ADL Overall ADL's : Needs assistance/impaired Eating/Feeding: Set up;Sitting   Grooming: Set up;Sitting   Upper Body Bathing: Set up;Sitting   Lower Body Bathing: Moderate assistance;Sit to/from stand   Upper Body Dressing : Set up;Sitting   Lower Body Dressing: Moderate assistance;Sit to/from stand   Toilet Transfer: RW;Minimal Print production planner Details (indicate cue type and reason): sit to stand only- pt tired and wanted to sit back down Toileting- Water quality scientist and Hygiene: Moderate assistance;Sit to/from stand         General ADL Comments: pt has limited A at home- pt eludes that husband not very helpful               Pertinent Vitals/Pain Pain Assessment: 0-10 Pain Score: 0-No pain     Hand Dominance     Extremity/Trunk Assessment Upper Extremity Assessment Upper Extremity Assessment: Generalized weakness           Communication  Communication Communication: HOH   Cognition Arousal/Alertness: Awake/alert Behavior During Therapy: WFL for tasks assessed/performed Overall Cognitive Status: Within Functional Limits for tasks assessed                     General Comments   husband  not very helpful- pt needs to be close to I upon DC.            Home Living Family/patient expects to be discharged to:: Private residence Living Arrangements: Spouse/significant other Available Help at Discharge: Family Type of Home: House       Home Layout: Two level;Able to live on main level with bedroom/bathroom     Bathroom Shower/Tub: Teacher, early years/pre: Standard     Home Equipment: Other (comment) (pt has reacher and shoe horn)          Prior Functioning/Environment Level of Independence: Independent             OT Diagnosis: Generalized weakness   OT Problem List: Decreased strength;Decreased activity tolerance   OT Treatment/Interventions: Self-care/ADL training;DME and/or AE instruction;Patient/family education    OT Goals(Current goals can be found in the care plan section) Acute Rehab OT Goals Patient Stated Goal: be able to take care of myself- I am the doer OT Goal Formulation: With patient Time For Goal Achievement: 07/19/14 Potential to Achieve Goals: Good ADL Goals Pt Will Perform Grooming: with modified  independence;standing Pt Will Perform Lower Body Dressing: with modified independence;sit to/from stand Pt Will Transfer to Toilet: with modified independence;regular height toilet;ambulating Pt Will Perform Toileting - Clothing Manipulation and hygiene: with modified independence;sit to/from stand  OT Frequency: Min 2X/week              End of Session Equipment Utilized During Treatment: Surveyor, mining Communication: Mobility status  Activity Tolerance: Patient tolerated treatment well Patient left: in bed   Time: 1250-1310 OT Time Calculation (min):  20 min Charges:  OT General Charges $OT Visit: 1 Procedure OT Evaluation $Initial OT Evaluation Tier I: 1 Procedure G-Codes:    Payton Mccallum D Jul 22, 2014, 1:13 PM

## 2014-07-05 NOTE — Discharge Instructions (Addendum)
Dr. Gaynelle Arabian Total Joint Specialist [facility] [address], [place], [place] [address] [phone]   POSTERIOR TOTAL HIP REPLACEMENT POSTOPERATIVE DIRECTIONS  Hip Rehabilitation, Guidelines Following Surgery  The results of a hip operation are greatly improved after range of motion and muscle strengthening exercises. Follow all safety measures which are given to protect your hip. If any of these exercises cause increased pain or swelling in your joint, decrease the amount until you are comfortable again. Then slowly increase the exercises. Call your caregiver if you have problems or questions.   HOME CARE INSTRUCTIONS  Remove items at home which could result in a fall. This includes throw rugs or furniture in walking pathways.   ICE to the affected hip every three hours for 30 minutes at a time and then as needed for pain and swelling.  Continue to use ice on the hip for pain and swelling from surgery. You may notice swelling that will progress down to the foot and ankle.  This is normal after surgery.  Elevate the leg when you are not up walking on it.    Continue to use the breathing machine which will help keep your temperature down.  It is common for your temperature to cycle up and down following surgery, especially at night when you are not up moving around and exerting yourself.  The breathing machine keeps your lungs expanded and your temperature down.  DIET You may resume your previous home diet once your are discharged from the hospital.  DRESSING / WOUND CARE / SHOWERING You may change your dressing 3-5 days after surgery.  Then change the dressing every day with sterile gauze.  Please use good hand washing techniques before changing the dressing.  Do not use any lotions or creams on the incision until instructed by your surgeon. You may start showering once you are discharged home but do not submerge the incision under water. Just pat the  incision dry and apply a dry gauze dressing on daily. Change the surgical dressing daily and reapply a dry dressing each time.    ACTIVITY Walk with your walker as instructed. Use walker as long as suggested by your caregivers. Avoid periods of inactivity such as sitting longer than an hour when not asleep. This helps prevent blood clots.  You may resume a sexual relationship in one month or when given the OK by your doctor.  You may return to work once you are cleared by your doctor.  Do not drive a car for 6 weeks or until released by you surgeon.  Do not drive while taking narcotics.  WEIGHT BEARING Weight bearing as tolerated with assist device (walker, cane, etc) as directed, use it as long as suggested by your surgeon or therapist, typically at least 4-6 weeks.  POSTOPERATIVE CONSTIPATION PROTOCOL Constipation - defined medically as fewer than three stools per week and severe constipation as less than one stool per week.  One of the most common issues patients have following surgery is constipation.  Even if you have a regular bowel pattern at home, your normal regimen is likely to be disrupted due to multiple reasons following surgery.  Combination of anesthesia, postoperative narcotics, change in appetite and fluid intake all can affect your bowels.  In order to avoid complications following surgery, here are some recommendations in order to help you during your recovery period.  Colace (docusate) - Pick up an over-the-counter form of Colace or another stool softener and take twice a  day as long as you are requiring postoperative pain medications.  Take with a full glass of water daily.  If you experience loose stools or diarrhea, hold the colace until you stool forms back up.  If your symptoms do not get better within 1 week or if they get worse, check with your doctor.  Dulcolax (bisacodyl) - Pick up over-the-counter and take as directed by the product packaging as needed to assist  with the movement of your bowels.  Take with a full glass of water.  Use this product as needed if not relieved by Colace only.   MiraLax (polyethylene glycol) - Pick up over-the-counter to have on hand.  MiraLax is a solution that will increase the amount of water in your bowels to assist with bowel movements.  Take as directed and can mix with a glass of water, juice, soda, coffee, or tea.  Take if you go more than two days without a movement. Do not use MiraLax more than once per day. Call your doctor if you are still constipated or irregular after using this medication for 7 days in a row.  If you continue to have problems with postoperative constipation, please contact the office for further assistance and recommendations.  If you experience "the worst abdominal pain ever" or develop nausea or vomiting, please contact the office immediatly for further recommendations for treatment.  ITCHING  If you experience itching with your medications, try taking only a single pain pill, or even half a pain pill at a time.  You can also use Benadryl over the counter for itching or also to help with sleep.   TED HOSE STOCKINGS Wear the elastic stockings on both legs for three weeks following surgery during the day but you may remove then at night for sleeping.  MEDICATIONS See your medication summary on the After Visit Summary that the nursing staff will review with you prior to discharge.  You may have some home medications which will be placed on hold until you complete the course of blood thinner medication.  It is important for you to complete the blood thinner medication as prescribed by your surgeon.  Continue your approved medications as instructed at time of discharge.  PRECAUTIONS If you experience chest pain or shortness of breath - call 911 immediately for transfer to the hospital emergency department.  If you develop a fever greater that 101 F, purulent drainage from wound, increased redness  or drainage from wound, foul odor from the wound/dressing, or calf pain - CONTACT YOUR SURGEON.                                                   FOLLOW-UP APPOINTMENTS Make sure you keep all of your appointments after your operation with your surgeon and caregivers. You should call the office at the above phone number and make an appointment for approximately two weeks after the date of your surgery or on the date instructed by your surgeon outlined in the "After Visit Summary".  RANGE OF MOTION AND STRENGTHENING EXERCISES  These exercises are designed to help you keep full movement of your hip joint. Follow your caregiver's or physical therapist's instructions. Perform all exercises about fifteen times, three times per day or as directed. Exercise both hips, even if you have had only one joint replacement. These exercises can be done  on a training (exercise) mat, on the floor, on a table or on a bed. Use whatever works the best and is most comfortable for you. Use music or television while you are exercising so that the exercises are a pleasant break in your day. This will make your life better with the exercises acting as a break in routine you can look forward to.  Lying on your back, slowly slide your foot toward your buttocks, raising your knee up off the floor. Then slowly slide your foot back down until your leg is straight again.  Lying on your back spread your legs as far apart as you can without causing discomfort.  Lying on your side, raise your upper leg and foot straight up from the floor as far as is comfortable. Slowly lower the leg and repeat.  Lying on your back, tighten up the muscle in the front of your thigh (quadriceps muscles). You can do this by keeping your leg straight and trying to raise your heel off the floor. This helps strengthen the largest muscle supporting your knee.  Lying on your back, tighten up the muscles of your buttocks both with the legs straight and with the knee  bent at a comfortable angle while keeping your heel on the floor.      IF YOU ARE TRANSFERRED TO A SKILLED REHAB FACILITY If the patient is transferred to a skilled rehab facility following release from the hospital, a list of the current medications will be sent to the facility for the patient to continue.  When discharged from the skilled rehab facility, please have the facility set up the patient's [place] prior to being released. Also, the skilled facility will be responsible for providing the patient with their medications at time of release from the facility to include their pain medication, the muscle relaxants, and their blood thinner medication. If the patient is still at the rehab facility at time of the two week follow up appointment, the skilled rehab facility will also need to assist the patient in arranging follow up appointment in our office and any transportation needs.  MAKE SURE YOU:  Understand these instructions.  Get help right away if you are not doing well or get worse.    Pick up stool softner and laxative for home use following surgery while on pain medications. Do not submerge incision under water. Please use good hand washing techniques while changing dressing each day. May shower starting three days after surgery. Please use a clean towel to pat the incision dry following showers. Continue to use ice for pain and swelling after surgery. Do not use any lotions or creams on the incision until instructed by your surgeon.  Take Xarelto for two and a half more weeks, then discontinue Xarelto. Once the patient has completed the Xarelto, they may resume the 81 mg Aspirin.  Information on my medicine - XARELTO (Rivaroxaban)  This medication education was reviewed with me or my healthcare representative as part of my discharge preparation.  The pharmacist that spoke with me during my hospital stay was:  Angela [facility]  Why was Xarelto  prescribed for you? Xarelto was prescribed for you to reduce the risk of blood clots forming after orthopedic surgery. The medical term for these abnormal blood clots is venous thromboembolism (VTE).  What do you need to know about xarelto ? Take your Xarelto ONCE DAILY at the same time every day. You may take it either with or without  If you have difficulty swallowing the tablet whole, you may crush it and mix in applesauce just prior to taking your dose. ° °Take Xarelto® exactly as prescribed by your doctor and DO NOT stop taking Xarelto® without talking to the doctor who prescribed the medication.  Stopping without other VTE prevention medication to take the place of Xarelto® may increase your risk of developing a clot. ° °After discharge, you should have regular check-up appointments with your healthcare provider that is prescribing your Xarelto®.   ° °What do you do if you miss a dose? °If you miss a dose, take it as soon as you remember on the same day then continue your regularly scheduled once daily regimen the next day. Do not take two doses of Xarelto® on the same day.  ° °Important Safety Information °A possible side effect of Xarelto® is bleeding. You should call your healthcare provider right away if you experience any of the following: °? Bleeding from an injury or your nose that does not stop. °? Unusual colored urine (red or dark brown) or unusual colored stools (red or black). °? Unusual bruising for unknown reasons. °? A serious fall or if you hit your head (even if there is no bleeding). ° °Some medicines may interact with Xarelto® and might increase your risk of bleeding while on Xarelto®. To help avoid this, consult your healthcare provider or pharmacist prior to using any new prescription or non-prescription medications, including herbals, vitamins, non-steroidal anti-inflammatory drugs (NSAIDs) and supplements. ° °This website has more information on Xarelto®:  www.xarelto.com. ° ° °

## 2014-07-05 NOTE — Progress Notes (Signed)
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  Pt should progress to dc home with family assist and HHPT follow up.  Follow Up Recommendations  Home health PT     Equipment Recommendations  None recommended by PT    Recommendations for Other Services OT consult     Precautions / Restrictions Precautions Precautions: Posterior Hip;Fall Precaution Booklet Issued: Yes (comment) Precaution Comments: Sign hung in room, All precautions reviewed Restrictions Weight Bearing Restrictions: No Other Position/Activity Restrictions: WBAT    Mobility  Bed Mobility Overal bed mobility: Needs Assistance Bed Mobility: Supine to Sit     Supine to sit: Mod assist     General bed mobility comments: pt in chair  Transfers Overall transfer level: Needs assistance Equipment used: Rolling walker (2 wheeled) Transfers: Sit to/from Stand Sit to Stand: Min assist         General transfer comment: Vc for hand placement  Ambulation/Gait Ambulation/Gait assistance: Min assist;Mod assist Ambulation Distance (Feet): 65 Feet Assistive device: Rolling walker (2 wheeled) Gait Pattern/deviations: Step-to pattern;Decreased step length - right;Decreased step length - left;Shuffle;Trunk flexed Gait velocity: decr   General Gait Details: cues for sequence, posture and position from [employer]            Wheelchair Mobility    Modified Rankin (Stroke Patients Only)       Balance                                    Cognition Arousal/Alertness: Awake/alert Behavior During Therapy: WFL for tasks assessed/performed Overall Cognitive Status: Within Functional Limits for tasks  assessed                      Exercises Total Joint Exercises Ankle Circles/Pumps: AROM;Both;Supine;15 reps Quad Sets: AROM;Both;10 reps;Supine Heel Slides: AAROM;Left;15 reps;Supine Hip ABduction/ADduction: AAROM;Left;15 reps;Supine    General Comments        Pertinent Vitals/Pain Pain Assessment: 0-10 Pain Score: 0-No pain Pain Location: L hip Pain Descriptors / Indicators: Aching;Sore Pain Intervention(s): Limited activity within patient's tolerance;Monitored during session;Premedicated before session;Ice applied    Home Living Family/patient expects to be discharged to:: Private residence Living Arrangements: Spouse/significant other Available Help at Discharge: Family Type of Home: House Home Access: Stairs to enter Entrance Stairs-Rails: None Home Layout: Two level;Able to live on main level with bedroom/bathroom Home Equipment: Other (comment) (pt has reacher and shoe horn)      Prior Function Level of Independence: Independent          PT Goals (current goals can now be found in the care plan section) Acute Rehab PT Goals Patient Stated Goal: be able to take care of myself- I am the doer PT Goal Formulation: With patient Time For Goal Achievement: 07/12/14 Potential to Achieve Goals: Good    Frequency  7X/week    PT Plan      Co-evaluation             End of Session Equipment Utilized During Treatment: Gait belt Activity Tolerance: Patient tolerated treatment well Patient left: in chair;with call bell/phone within reach  Time: 4259-5638 PT Time Calculation (min) (ACUTE ONLY): 35 min  Charges:  $Therapeutic Exercise: 8-22 mins                    G Codes:      Lisaanne Lawrie 2014/07/29, 2:44 PM

## 2014-07-05 NOTE — Plan of Care (Signed)
Problem: Consults Goal: Diagnosis- Total Joint Replacement Outcome: Completed/Met Date Met:  07/05/14 Revision Total Hip Unihip conversion to LEFT Total hip

## 2014-07-05 NOTE — Progress Notes (Addendum)
   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   Patient seen in rounds by Dr. Wynelle Link. Patient is well, but has had some minor complaints of pain in the hip, requiring pain medications We will start therapy today.  Plan is to go Home after hospital stay.  Objective: Vital signs in last 24 hours: Temp:  [97 F (36.1 C)-98.5 F (36.9 C)] 98.5 F (36.9 C) (04/21 0532) Pulse Rate:  [58-87] 64 (04/21 0532) Resp:  [8-16] 16 (04/21 0532) BP: (103-140)/(61-75) 103/63 mmHg (04/21 0532) SpO2:  [98 %-100 %] 99 % (04/21 0532)  Intake/Output from previous day:  Intake/Output Summary (Last 24 hours) at 07/05/14 0730 Last data filed at 07/05/14 0532  Gross per 24 hour  Intake   2150 ml  Output   2355 ml  Net   -205 ml    Intake/Output this shift: UOP 400 since around [place]  Labs:  Recent Labs  07/05/14 0416  HGB 9.0*    Recent Labs  07/05/14 0416  WBC 9.7  RBC 2.62*  HCT 24.9*  PLT 191    Recent Labs  07/05/14 0416  NA 130*  K 3.9  CL 97  CO2 26  BUN 11  CREATININE 0.77  GLUCOSE 148*  CALCIUM 8.2*   No results for input(s): LABPT, INR in the last 72 hours.  EXAM General - Patient is Alert and Appropriate Extremity - Neurovascular intact Sensation intact distally Dressing - dressing C/D/I Motor Function - intact, moving foot and toes well on exam.  Hemovac pulled without difficulty.  Past Medical History  Diagnosis Date  . Dysphagia   . Hiatal hernia   . Hyperlipidemia   . Colonic polyp     adenomatous poly. No high grade dysplasia or invasive malignancy. 07/06/2006  . Depression   . Anxiety   . Impaired hearing     bilat   . Hypertension     pt states is currently not on medication   . Pneumonia     hx of   . Asthma     hx of in childhood   . GERD (gastroesophageal reflux disease)   . Arthritis   . Anemia     past hx of acute blood loss anemia   . Irritable bowel  syndrome   . Scarlet fever     hx of in childhood   . Esophageal stricture     hx of   . 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   Weight as of this encounter: 52.617 kg (116 lb). Advance diet Up with therapy Discharge home with home health when ready  DVT Prophylaxis - Xarelto Weight Bearing As Tolerated left Leg D/C Knee Immobilizer Hemovac Pulled Begin Therapy Hip Preacutions  Comments:  No DME Needs  Arlee Muslim, PA-C Orthopaedic Surgery 07/05/2014, 7:30 AM

## 2014-07-05 NOTE — Progress Notes (Signed)
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  Follow Up Recommendations  Home health PT     Equipment Recommendations  None recommended by PT    Recommendations for Other Services OT consult     Precautions / Restrictions Precautions Precautions: Posterior Hip;Fall Precaution Booklet Issued: Yes (comment) Precaution Comments: Sign hung in room, All precautions reviewed - Pt recalled 1/3 without cues Restrictions Weight Bearing Restrictions: No Other Position/Activity Restrictions: WBAT    Mobility  Bed Mobility Overal bed mobility: Needs Assistance Bed Mobility: Sit to Supine       Sit to supine: Min assist   General bed mobility comments: cues for sequence, adherence to THP and use of R LE to self assist  Transfers Overall transfer level: Needs assistance Equipment used: Rolling walker (2 wheeled) Transfers: Sit to/from Stand Sit to Stand: Min assist         General transfer comment: Vc for hand placement and LE management  Ambulation/Gait Ambulation/Gait assistance: Min assist;Min guard Ambulation Distance (Feet): 130 Feet Assistive device: Rolling walker (2 wheeled) Gait Pattern/deviations: Step-to pattern;Step-through pattern;Decreased step length - right;Decreased step length - left;Shuffle;Trunk flexed Gait velocity: decr   General Gait Details: cues for sequence, posture and position from [employer]            Wheelchair Mobility    Modified Rankin (Stroke Patients Only)       Balance                                    Cognition Arousal/Alertness: Awake/alert Behavior During Therapy: WFL for tasks assessed/performed Overall Cognitive Status: Within Functional Limits for tasks assessed                       Exercises      General Comments        Pertinent Vitals/Pain Pain Assessment: 0-10 Pain Score: 2  Pain Location: L hip Pain Descriptors / Indicators: Aching;Sore Pain Intervention(s): Limited activity within patient's tolerance;Monitored during session;Premedicated before session;Ice applied    Home Living Family/patient expects to be discharged to:: Private residence Living Arrangements: Spouse/significant other Available Help at Discharge: Family Type of Home: House     Home Layout: Two level;Able to live on main level with bedroom/bathroom Home Equipment: Other (comment) (pt has reacher and shoe horn)      Prior Function Level of Independence: Independent          PT Goals (current goals can now be found in the care plan section) Acute Rehab PT Goals Patient Stated Goal: be able to take care of myself- I am the doer PT Goal Formulation: With patient Time For Goal Achievement: 07/12/14 Potential to Achieve Goals: Good Progress towards PT goals: Progressing toward goals    Frequency  7X/week    PT Plan Current plan remains appropriate    Co-evaluation             End of Session Equipment Utilized During Treatment: Gait belt Activity Tolerance: Patient tolerated treatment well Patient left: in bed;with call bell/phone within reach;with family/visitor present     Time: 2536-6440 PT Time Calculation (min) (ACUTE ONLY): 37 min  Charges:  $Gait  Training: 23-37 mins                    G Codes:      Callan Norden Jul 28, 2014, 3:53 PM

## 2014-07-06 LAB — BASIC METABOLIC PANEL
Anion gap: 4 — ABNORMAL LOW (ref 5–15)
BUN: 12 mg/dL (ref 6–23)
CALCIUM: 8.4 mg/dL (ref 8.4–10.5)
CO2: 29 mmol/L (ref 19–32)
CREATININE: 0.68 mg/dL (ref 0.50–1.10)
Chloride: 101 mmol/L (ref 96–112)
GFR calc Af Amer: >90 mL/min (ref >90)
GFR calc non Af Amer: 83 mL/min — ABNORMAL LOW (ref >90)
Glucose, Bld: 107 mg/dL — ABNORMAL HIGH (ref 70–99)
Potassium: 4.3 mmol/L (ref 3.5–5.1)
Sodium: 134 mmol/L — ABNORMAL LOW (ref 135–145)

## 2014-07-06 LAB — CBC
HCT: 26.7 % — ABNORMAL LOW (ref 36.0–46.0)
Hemoglobin: 8.8 g/dL — ABNORMAL LOW (ref 12.0–15.0)
MCH: 31.8 pg (ref 26.0–34.0)
MCHC: 33 g/dL (ref 30.0–36.0)
MCV: 96.4 fL (ref 78.0–100.0)
Platelets: 213 10*3/uL (ref 150–400)
RBC: 2.77 MIL/uL — ABNORMAL LOW (ref 3.87–5.11)
RDW: 12.9 % (ref 11.5–15.5)
WBC: 8.7 10*3/uL (ref 4.0–10.5)

## 2014-07-06 MED ORDER — OXYCODONE HCL 5 MG PO TABS: 5.0000 mg | ORAL_TABLET | ORAL | Status: DC | PRN

## 2014-07-06 MED ORDER — RIVAROXABAN 10 MG PO TABS: 10.0000 mg | ORAL_TABLET | Freq: Every day | ORAL | Status: DC

## 2014-07-06 MED ORDER — METHOCARBAMOL 500 MG PO TABS: 500.0000 mg | ORAL_TABLET | Freq: Four times a day (QID) | ORAL | Status: DC | PRN

## 2014-07-06 MED ORDER — TRAMADOL HCL 50 MG PO TABS: 50.0000 mg | ORAL_TABLET | Freq: Two times a day (BID) | ORAL | Status: DC | PRN

## 2014-07-06 NOTE — Progress Notes (Signed)
   Subjective: 2 Days Post-Op Procedure(s) (LRB): LEFT CONVERSION TO TOTAL HIP ARTHROPLASTY (Left) Patient reports pain as mild.   Patient seen in rounds with Dr. Wynelle Link. Sitting up in chair dressed and ready to go. Patient is well, and has had no acute complaints or problems Patient is ready to go home.  Objective: Vital signs in last 24 hours: Temp:  [97.7 F (36.5 C)-99.1 F (37.3 C)] 99.1 F (37.3 C) (04/22 1326) Pulse Rate:  [66-80] 80 (04/22 1326) Resp:  [16] 16 (04/22 1326) BP: (101-142)/(57-70) 101/57 mmHg (04/22 1326) SpO2:  [98 %-100 %] 99 % (04/22 1326)  Intake/Output from previous day:  Intake/Output Summary (Last 24 hours) at 07/06/14 1804 Last data filed at 07/06/14 1115  Gross per 24 hour  Intake    480 ml  Output    300 ml  Net    180 ml    Intake/Output this shift: Total I/O In: 120 [P.O.:120] Out: -   Labs:  Recent Labs  07/05/14 0416 07/06/14 0413  HGB 9.0* 8.8*    Recent Labs  07/05/14 0416 07/06/14 0413  WBC 9.7 8.7  RBC 2.62* 2.77*  HCT 24.9* 26.7*  PLT 191 213    Recent Labs  07/05/14 0416 07/06/14 0413  NA 130* 134*  K 3.9 4.3  CL 97 101  CO2 26 29  BUN 11 12  CREATININE 0.77 0.68  GLUCOSE 148* 107*  CALCIUM 8.2* 8.4   No results for input(s): LABPT, INR in the last 72 hours.  EXAM: General - Patient is Alert, Appropriate and Oriented Extremity - Neurovascular intact Sensation intact distally Dorsiflexion/Plantar flexion intact Incision - clean, dry, no drainage Motor Function - intact, moving foot and toes well on exam.   Assessment/Plan: 2 Days Post-Op Procedure(s) (LRB): LEFT CONVERSION TO TOTAL HIP ARTHROPLASTY (Left) Procedure(s) (LRB): LEFT CONVERSION TO TOTAL HIP ARTHROPLASTY (Left) Past Medical History  Diagnosis Date  . Dysphagia   . Hiatal hernia   . Hyperlipidemia   . Colonic polyp     adenomatous poly. No high grade dysplasia or invasive malignancy. 07/06/2006  . Depression   . Anxiety   .  Impaired hearing     bilat   . Hypertension     pt states is currently not on medication   . Pneumonia     hx of   . Asthma     hx of in childhood   . GERD (gastroesophageal reflux disease)   . Arthritis   . Anemia     past hx of acute blood loss anemia   . Irritable bowel syndrome   . Scarlet fever     hx of in childhood   . Esophageal stricture     hx of   . History of blood transfusion     following left hip fx/hemiarthroplasty    Principal Problem:   Pain with hip hemiarthroplasty  Estimated body mass index is 19.3 kg/(m^2) as calculated from the following:   Height as of this encounter: 5\' 5"  (1.651 m).   Weight as of this encounter: 52.617 kg (116 lb). Discharge home with home health Diet - Regular diet Follow up - in 2 weeks Activity - WBAT Disposition - Home Condition Upon Discharge - Good D/C Meds - See DC Summary DVT Prophylaxis - Xarelto  Arlee Muslim, PA-C Orthopaedic Surgery 07/06/2014, 6:04 PM

## 2014-07-06 NOTE — Discharge Summary (Signed)
Physician Discharge Summary   Patient ID: Marilyn Haas MRN: [id number] DOB/AGE: 1940/03/26 74 y.o.  Admit date: 07/04/2014 Discharge date: 07-06-2014  Primary Diagnosis:  Painful left hip unipolar hemiarthroplasty.  Admission Diagnoses:  Past Medical History  Diagnosis Date  . Dysphagia   . Hiatal hernia   . Hyperlipidemia   . Colonic polyp     adenomatous poly. No high grade dysplasia or invasive malignancy. 07/06/2006  . Depression   . Anxiety   . Impaired hearing     bilat   . Hypertension     pt states is currently not on medication   . Pneumonia     hx of   . Asthma     hx of in childhood   . GERD (gastroesophageal reflux disease)   . Arthritis   . Anemia     past hx of acute blood loss anemia   . Irritable bowel syndrome   . Scarlet fever     hx of in childhood   . Esophageal stricture     hx of   . History of blood transfusion     following left hip fx/hemiarthroplasty    Discharge Diagnoses:   Principal Problem:   Pain with hip hemiarthroplasty  Estimated body mass index is 19.3 kg/(m^2) as calculated from the following:   Height as of this encounter: _0  (1.651 m).   Weight as of this encounter: 52.617 kg (116 lb).  Procedure(s) (LRB): LEFT CONVERSION TO TOTAL HIP ARTHROPLASTY (Left)   Consults: None  HPI: Marilyn Haas is a 74 year old female who had left hip hemiarthroplasty done for femoral neck fracture several years ago. She had progressively worsening groin and thigh pain. It appears though the bipolar component has eroded through the acetabular cartilage. She had a bone scan done which did not show any loosening of the cemented femoral stem. She presents now for conversion of previous hip surgery to total hip arthroplasty.  Laboratory Data: Admission on 07/04/2014  Component Date Value Ref Range Status  . WBC 07/05/2014 9.7  4.0 - 10.5 K/uL Final  . RBC 07/05/2014 2.62* 3.87 - 5.11 MIL/uL Final  . Hemoglobin 07/05/2014 9.0* 12.0 -  15.0 g/dL Final  . HCT 07/05/2014 24.9* 36.0 - 46.0 % Final  . MCV 07/05/2014 95.0  78.0 - 100.0 fL Final  . MCH 07/05/2014 34.4* 26.0 - 34.0 pg Final  . MCHC 07/05/2014 36.1* 30.0 - 36.0 g/dL Final  . RDW 07/05/2014 12.7  11.5 - 15.5 % Final  . Platelets 07/05/2014 191  150 - 400 K/uL Final  . Sodium 07/05/2014 130* 135 - 145 mmol/L Final  . Potassium 07/05/2014 3.9  3.5 - 5.1 mmol/L Final  . Chloride 07/05/2014 97  96 - 112 mmol/L Final  . CO2 07/05/2014 26  19 - 32 mmol/L Final  . Glucose, Bld 07/05/2014 148* 70 - 99 mg/dL Final  . BUN 07/05/2014 11  6 - 23 mg/dL Final  . Creatinine, Ser 07/05/2014 0.77  0.50 - 1.10 mg/dL Final  . Calcium 07/05/2014 8.2* 8.4 - 10.5 mg/dL Final  . GFR calc non Af Amer 07/05/2014 80* >90 mL/min Final  . GFR calc Af Amer 07/05/2014 >90  >90 mL/min Final   Comment: (NOTE) The eGFR has been calculated using the CKD EPI equation. This calculation has not been validated in all clinical situations. eGFR's persistently <90 mL/min signify possible Chronic Kidney Disease.   . Anion gap 07/05/2014 7  5 - 15 Final  . WBC  07/06/2014 8.7  4.0 - 10.5 K/uL Final  . RBC 07/06/2014 2.77* 3.87 - 5.11 MIL/uL Final  . Hemoglobin 07/06/2014 8.8* 12.0 - 15.0 g/dL Final  . HCT 07/06/2014 26.7* 36.0 - 46.0 % Final  . MCV 07/06/2014 96.4  78.0 - 100.0 fL Final  . MCH 07/06/2014 31.8  26.0 - 34.0 pg Final  . MCHC 07/06/2014 33.0  30.0 - 36.0 g/dL Final  . RDW 07/06/2014 12.9  11.5 - 15.5 % Final  . Platelets 07/06/2014 213  150 - 400 K/uL Final  . Sodium 07/06/2014 134* 135 - 145 mmol/L Final  . Potassium 07/06/2014 4.3  3.5 - 5.1 mmol/L Final  . Chloride 07/06/2014 101  96 - 112 mmol/L Final  . CO2 07/06/2014 29  19 - 32 mmol/L Final  . Glucose, Bld 07/06/2014 107* 70 - 99 mg/dL Final  . BUN 07/06/2014 12  6 - 23 mg/dL Final  . Creatinine, Ser 07/06/2014 0.68  0.50 - 1.10 mg/dL Final  . Calcium 07/06/2014 8.4  8.4 - 10.5 mg/dL Final  . GFR calc non Af Amer  07/06/2014 83* >90 mL/min Final  . GFR calc Af Amer 07/06/2014 >90  >90 mL/min Final   Comment: (NOTE) The eGFR has been calculated using the CKD EPI equation. This calculation has not been validated in all clinical situations. eGFR's persistently <90 mL/min signify possible Chronic Kidney Disease.   Georgiann Hahn gap 07/06/2014 4* 5 - 15 Final  Hospital Outpatient Visit on 06/26/2014  Component Date Value Ref Range Status  . MRSA, PCR 06/26/2014 NEGATIVE  NEGATIVE Final  . Staphylococcus aureus 06/26/2014 NEGATIVE  NEGATIVE Final   Comment:        The Xpert SA Assay (FDA approved for NASAL specimens in patients over 83 years of age), is one component of a comprehensive surveillance program.  Test performance has been validated by [facility] for patients greater than or equal to 63 year old. It is not intended to diagnose infection nor to guide or monitor treatment.   Marland Kitchen aPTT 06/26/2014 36  24 - 37 seconds Final  . WBC 06/26/2014 6.5  4.0 - 10.5 K/uL Final  . RBC 06/26/2014 3.67* 3.87 - 5.11 MIL/uL Final  . Hemoglobin 06/26/2014 11.7* 12.0 - 15.0 g/dL Final  . HCT 06/26/2014 34.9* 36.0 - 46.0 % Final  . MCV 06/26/2014 95.1  78.0 - 100.0 fL Final  . MCH 06/26/2014 31.9  26.0 - 34.0 pg Final  . MCHC 06/26/2014 33.5  30.0 - 36.0 g/dL Final  . RDW 06/26/2014 12.6  11.5 - 15.5 % Final  . Platelets 06/26/2014 270  150 - 400 K/uL Final  . Sodium 06/26/2014 134* 135 - 145 mmol/L Final  . Potassium 06/26/2014 4.8  3.5 - 5.1 mmol/L Final  . Chloride 06/26/2014 98  96 - 112 mmol/L Final  . CO2 06/26/2014 29  19 - 32 mmol/L Final  . Glucose, Bld 06/26/2014 91  70 - 99 mg/dL Final  . BUN 06/26/2014 7  6 - 23 mg/dL Final  . Creatinine, Ser 06/26/2014 0.67  0.50 - 1.10 mg/dL Final  . Calcium 06/26/2014 9.3  8.4 - 10.5 mg/dL Final  . Total Protein 06/26/2014 7.5  6.0 - 8.3 g/dL Final  . Albumin 06/26/2014 4.6  3.5 - 5.2 g/dL Final  . AST 06/26/2014 48* 0 - 37 U/L Final  . ALT 06/26/2014 42*  0 - 35 U/L Final  . Alkaline Phosphatase 06/26/2014 80  39 - 117 U/L Final  .  Total Bilirubin 06/26/2014 0.7  0.3 - 1.2 mg/dL Final  . GFR calc non Af Amer 06/26/2014 84* >90 mL/min Final  . GFR calc Af Amer 06/26/2014 >90  >90 mL/min Final   Comment: (NOTE) The eGFR has been calculated using the CKD EPI equation. This calculation has not been validated in all clinical situations. eGFR's persistently <90 mL/min signify possible Chronic Kidney Disease.   . Anion gap 06/26/2014 7  5 - 15 Final  . Prothrombin Time 06/26/2014 13.4  11.6 - 15.2 seconds Final  . INR 06/26/2014 1.01  0.00 - 1.49 Final  . ABO/RH(D) 06/26/2014 O POS   Final  . Antibody Screen 06/26/2014 NEG   Final  . Sample Expiration 06/26/2014 07/07/2014   Final  . Color, Urine 06/26/2014 AMBER* YELLOW Final   BIOCHEMICALS MAY BE AFFECTED BY COLOR  . APPearance 06/26/2014 CLEAR  CLEAR Final  . Specific Gravity, Urine 06/26/2014 1.018  1.005 - 1.030 Final  . pH 06/26/2014 6.0  5.0 - 8.0 Final  . Glucose, UA 06/26/2014 NEGATIVE  NEGATIVE mg/dL Final  . Hgb urine dipstick 06/26/2014 SMALL* NEGATIVE Final  . Bilirubin Urine 06/26/2014 SMALL* NEGATIVE Final  . Ketones, ur 06/26/2014 40* NEGATIVE mg/dL Final  . Protein, ur 06/26/2014 NEGATIVE  NEGATIVE mg/dL Final  . Urobilinogen, UA 06/26/2014 1.0  0.0 - 1.0 mg/dL Final  . Nitrite 06/26/2014 NEGATIVE  NEGATIVE Final  . Leukocytes, UA 06/26/2014 SMALL* NEGATIVE Final  . Squamous Epithelial / LPF 06/26/2014 RARE  RARE Final  . WBC, UA 06/26/2014 0-2  <3 WBC/hpf Final  . RBC / HPF 06/26/2014 0-2  <3 RBC/hpf Final  . Urine-Other 06/26/2014 MUCOUS PRESENT   Final  . ABO/RH(D) 06/26/2014 O POS   Final     X-Rays:Dg Pelvis Portable  07/04/2014   CLINICAL DATA:  Postop left total hip replacement.  EXAM: PORTABLE PELVIS 1-2 VIEWS  COMPARISON:  06/09/2008  FINDINGS: Revision of the previous left hip replacement. Normal AP alignment. Soft tissue drain in place. No hardware  complicating feature.  IMPRESSION: Revision of the left hip replacement without visible complicating feature.   Electronically Signed   By: Rolm Baptise M.D.   On: 07/04/2014 11:22    EKG: Orders placed or performed in visit on 02/20/14  . EKG 12-Lead     Hospital Course: Patient was admitted to [facility] and taken to the OR and underwent the above state procedure without complications.  Patient tolerated the procedure well and was later transferred to the recovery room and then to the orthopaedic floor for postoperative care.  They were given PO and IV analgesics for pain control following their surgery.  They were given 24 hours of postoperative antibiotics of  Anti-infectives    Start     Dose/Rate Route Frequency Ordered Stop   07/04/14 1430  ceFAZolin (ANCEF) IVPB 2 g/50 mL premix     2 g 100 mL/hr over 30 Minutes Intravenous Every 6 hours 07/04/14 1203 07/04/14 2100   07/04/14 0604  ceFAZolin (ANCEF) IVPB 2 g/50 mL premix  Status:  Discontinued     2 g 100 mL/hr over 30 Minutes Intravenous On call to O.R. 07/04/14 0604 07/04/14 1149     and started on DVT prophylaxis in the form of Xarelto.   PT and OT were ordered for total hip protocol.  The patient was allowed to be WBAT with therapy. Discharge planning was consulted to help with postop disposition and equipment needs.  Patient had a decent  the evening of surgery.  They started to get up OOB with therapy on day one.  Hemovac drain was pulled without difficulty.  The knee immobilizer was removed and discontinued.  Continued to work with therapy into day two.  Dressing was changed on day two and the incision was healing well. Patient was seen in rounds and was ready to go home.   Discharge home with home health Diet - Regular diet Follow up - in 2 weeks Activity - WBAT Disposition - Home Condition Upon Discharge - Good D/C Meds - See DC Summary DVT Prophylaxis - Xarelto No bending hip over 90 degrees- A "L" Angle Do  not cross legs Do not let foot roll inward When turning these patients a pillow should be placed between the patient's legs to prevent crossing. Patients should have the affected knee fully extended when trying to sit or stand from all surfaces to prevent excessive hip flexion. When ambulating and turning toward the affected side the affected leg should have the toes turned out prior to moving the walker and the rest of patient's body as to prevent internal rotation/ turning in of the leg. Abduction pillows are the most effective way to prevent a patient from not crossing legs or turning toes in at rest. If an abduction pillow is not ordered placing a regular pillow length wise between the patient's legs is also an effective reminder. It is imperative that these precautions be maintained so that the surgical hip does not dislocate.    Discharge Instructions    Call MD / Call 911    Complete by:  As directed   If you experience chest pain or shortness of breath, CALL 911 and be transported to the hospital emergency room.  If you develope a fever above 101 F, pus (white drainage) or increased drainage or redness at the wound, or calf pain, call your surgeon's office.     Change dressing    Complete by:  As directed   You may change your dressing dressing daily with sterile 4 x 4 inch gauze dressing and paper tape.  Do not submerge the incision under water.     Constipation Prevention    Complete by:  As directed   Drink plenty of fluids.  Prune juice may be helpful.  You may use a stool softener, such as Colace (over the counter) 100 mg twice a day.  Use MiraLax (over the counter) for constipation as needed.     Diet general    Complete by:  As directed      Discharge instructions    Complete by:  As directed   Pick up stool softner and laxative for home use following surgery while on pain medications. Do not submerge incision under water. Please use good hand washing techniques while changing  dressing each day. May shower starting three days after surgery. Please use a clean towel to pat the incision dry following showers. Continue to use ice for pain and swelling after surgery. Do not use any lotions or creams on the incision until instructed by your surgeon. Hip precautions.  Total Hip Protocol.  Take Xarelto for two and a half more weeks, then discontinue Xarelto. Once the patient has completed the Xarelto, they may resume the 81 mg Aspirin.  Postoperative Constipation Protocol  Constipation - defined medically as fewer than three stools per week and severe constipation as less than one stool per week.  One of the most common issues patients have following surgery  is constipation.  Even if you have a regular bowel pattern at home, your normal regimen is likely to be disrupted due to multiple reasons following surgery.  Combination of anesthesia, postoperative narcotics, change in appetite and fluid intake all can affect your bowels.  In order to avoid complications following surgery, here are some recommendations in order to help you during your recovery period.  Colace (docusate) - Pick up an over-the-counter form of Colace or another stool softener and take twice a day as long as you are requiring postoperative pain medications.  Take with a full glass of water daily.  If you experience loose stools or diarrhea, hold the colace until you stool forms back up.  If your symptoms do not get better within 1 week or if they get worse, check with your doctor.  Dulcolax (bisacodyl) - Pick up over-the-counter and take as directed by the product packaging as needed to assist with the movement of your bowels.  Take with a full glass of water.  Use this product as needed if not relieved by Colace only.   MiraLax (polyethylene glycol) - Pick up over-the-counter to have on hand.  MiraLax is a solution that will increase the amount of water in your bowels to assist with bowel movements.  Take  as directed and can mix with a glass of water, juice, soda, coffee, or tea.  Take if you go more than two days without a movement. Do not use MiraLax more than once per day. Call your doctor if you are still constipated or irregular after using this medication for 7 days in a row.  If you continue to have problems with postoperative constipation, please contact the office for further assistance and recommendations.  If you experience "the worst abdominal pain ever" or develop nausea or vomiting, please contact the office immediatly for further recommendations for treatment.     Do not sit on low chairs, stoools or toilet seats, as it may be difficult to get up from low surfaces    Complete by:  As directed      Driving restrictions    Complete by:  As directed   No driving until released by the physician.     Follow the hip precautions as taught in Physical Therapy    Complete by:  As directed      Increase activity slowly as tolerated    Complete by:  As directed      Lifting restrictions    Complete by:  As directed   No lifting until released by the physician.     Patient may shower    Complete by:  As directed   You may shower without a dressing once there is no drainage.  Do not wash over the wound.  If drainage remains, do not shower until drainage stops.     TED hose    Complete by:  As directed   Use stockings (TED hose) for 3 weeks on both leg(s).  You may remove them at night for sleeping.     Weight bearing as tolerated    Complete by:  As directed   Laterality:  left  Extremity:  Lower            Medication List    STOP taking these medications        ALIGN 4 MG Caps     aspirin EC 81 MG tablet     cholecalciferol 1000 UNITS tablet  Commonly known as:  VITAMIN D  HYDROcodone-acetaminophen 10-325 MG per tablet  Commonly known as:  NORCO     VITAMIN B-12 IJ      TAKE these medications        ALPRAZolam 0.5 MG tablet  Commonly known as:  XANAX  Take 0.5  mg by mouth at bedtime as needed for anxiety.     citalopram 40 MG tablet  Commonly known as:  CELEXA  Take 40 mg by mouth daily.     hyoscyamine 0.125 MG SL tablet  Commonly known as:  LEVSIN SL  Place 1 tablet (0.125 mg total) under the tongue every 6 (six) hours as needed.     methocarbamol 500 MG tablet  Commonly known as:  ROBAXIN  Take 1 tablet (500 mg total) by mouth every 6 (six) hours as needed for muscle spasms.     oxyCODONE 5 MG immediate release tablet  Commonly known as:  Oxy IR/ROXICODONE  Take 1-2 tablets (5-10 mg total) by mouth every 3 (three) hours as needed for moderate pain, severe pain or breakthrough pain.     pantoprazole 40 MG tablet  Commonly known as:  PROTONIX  TAKE ONE (1) TABLET EACH DAY     rivaroxaban 10 MG Tabs tablet  Commonly known as:  XARELTO  - Take 1 tablet (10 mg total) by mouth daily with breakfast. Take Xarelto for two and a half more weeks, then discontinue Xarelto.  - Once the patient has completed the Xarelto, they may resume the 81 mg Aspirin.     simvastatin 40 MG tablet  Commonly known as:  ZOCOR  TAKE ONE TABLET BY MOUTH EVERY NIGHT AT BEDTIME     traMADol 50 MG tablet  Commonly known as:  ULTRAM  Take 1-2 tablets (50-100 mg total) by mouth every 12 (twelve) hours as needed (mild pain).           Follow-up Information    Follow up with Gearlean Alf, MD. Schedule an appointment as soon as possible for a visit on 07/17/2014.   Specialty:  Orthopedic Surgery   Why:  Call office at [phone] to setup follow up appointment on Tuesday 07/17/2014 with Dr. Wynelle Link.   Contact information:   [address] [place] [address] [phone]       Signed: Arlee Muslim, PA-C Orthopaedic Surgery 07/06/2014, 6:12 PM

## 2014-07-06 NOTE — Progress Notes (Signed)
Physical Therapy Treatment Patient Details Name: Marilyn Haas MRN: [id number] DOB: [birth year] Today's Date: 07/06/2014    History of Present Illness LEFT CONVERSION TO TOTAL HIP ARTHROPLASTY     PT Comments    Pt progressing well with mobility.  Reviewed transfers, THP, stairs, bed mobility including rolling to side, and car transfers with pt and son.  Follow Up Recommendations  Home health PT     Equipment Recommendations  None recommended by PT    Recommendations for Other Services OT consult     Precautions / Restrictions Precautions Precautions: Posterior Hip;Fall Precaution Booklet Issued: Yes (comment) Precaution Comments: Sign hung in room, All precautions reviewed - Pt recalled 3/3 without cues Restrictions Weight Bearing Restrictions: No Other Position/Activity Restrictions: WBAT    Mobility  Bed Mobility Overal bed mobility: Needs Assistance Bed Mobility: Supine to Sit;Sit to Supine     Supine to sit: Supervision Sit to supine: Supervision   General bed mobility comments: min cues for use of R LE to self assist  Transfers Overall transfer level: Needs assistance Equipment used: Rolling walker (2 wheeled) Transfers: Sit to/from Stand Sit to Stand: Supervision         General transfer comment: min cues for LE management  Ambulation/Gait Ambulation/Gait assistance: Min guard;Supervision Ambulation Distance (Feet): 50 Feet Assistive device: Rolling walker (2 wheeled) Gait Pattern/deviations: Step-to pattern;Decreased step length - right;Decreased step length - left;Shuffle;Trunk flexed Gait velocity: decr   General Gait Details: min cues for sequence, posture and position from RW   Stairs Stairs: Yes Stairs assistance: Min assist Stair Management: No rails;Step to pattern;Backwards;With walker Number of Stairs: 4 General stair comments: cues for sequence and foot/RW placement.  2 steps twice with son assisting on second  attempt  Wheelchair Mobility    Modified Rankin (Stroke Patients Only)       Balance                                    Cognition Arousal/Alertness: Awake/alert Behavior During Therapy: WFL for tasks assessed/performed Overall Cognitive Status: Within Functional Limits for tasks assessed                      Exercises      General Comments        Pertinent Vitals/Pain Pain Assessment: 0-10 Pain Score: 1  Pain Location: L hip Pain Descriptors / Indicators: Sore Pain Intervention(s): Limited activity within patient's tolerance;Monitored during session;Premedicated before session    Home Living                      Prior Function            PT Goals (current goals can now be found in the care plan section) Acute Rehab PT Goals Patient Stated Goal: be able to take care of myself- I am the doer PT Goal Formulation: With patient Time For Goal Achievement: 07/12/14 Potential to Achieve Goals: Good Progress towards PT goals: Progressing toward goals    Frequency  7X/week    PT Plan Current plan remains appropriate    Co-evaluation             End of Session Equipment Utilized During Treatment: Gait belt Activity Tolerance: Patient tolerated treatment well Patient left: in chair;with call bell/phone within reach;with family/visitor present     Time: 0347-4259 PT Time Calculation (min) (ACUTE ONLY): 44 min  Charges:  $Gait Training: 8-22 mins $Therapeutic Exercise: 8-22 mins $Therapeutic Activity: 8-22 mins                    G Codes:      Hatsuko Bizzarro 07-25-14, 4:10 PM

## 2014-07-06 NOTE — Progress Notes (Signed)
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     PT Comments    Progressing well with mobility but apprehensive Re d/c home  Follow Up Recommendations  Home health PT     Equipment Recommendations  None recommended by PT    Recommendations for Other Services OT consult     Precautions / Restrictions Precautions Precautions: Posterior Hip;Fall Precaution Booklet Issued: Yes (comment) Precaution Comments: Sign hung in room, All precautions reviewed - Pt recalled 3/3 without cues Restrictions Weight Bearing Restrictions: No Other Position/Activity Restrictions: WBAT    Mobility  Bed Mobility Overal bed mobility: Needs Assistance Bed Mobility: Sit to Supine     Supine to sit: Supervision Sit to supine: Supervision   General bed mobility comments: min cues for sequence, adherence to THP and use of R LE to self assist  Transfers Overall transfer level: Needs assistance Equipment used: Rolling walker (2 wheeled) Transfers: Sit to/from Stand Sit to Stand: Supervision         General transfer comment: Vc for hand placement and LE management  Ambulation/Gait Ambulation/Gait assistance: Min guard;Supervision Ambulation Distance (Feet): 200 Feet Assistive device: Rolling walker (2 wheeled) Gait Pattern/deviations: Step-to pattern;Step-through pattern;Shuffle;Trunk flexed Gait velocity: decr   General Gait Details: min cues for sequence, posture and position from RW   Stairs Stairs: Yes Stairs assistance: Min assist Stair Management: Step to pattern;No rails;Backwards;With walker Number of Stairs: 2 General stair comments: cues for sequence and foot/RW placement  Wheelchair Mobility    Modified Rankin (Stroke Patients Only)       Balance                                    Cognition  Arousal/Alertness: Awake/alert Behavior During Therapy: WFL for tasks assessed/performed Overall Cognitive Status: Within Functional Limits for tasks assessed                      Exercises      General Comments        Pertinent Vitals/Pain Pain Assessment: 0-10 Pain Score: 2  Pain Location: L hip Pain Descriptors / Indicators: Aching;Sore Pain Intervention(s): Limited activity within patient's tolerance;Monitored during session;Premedicated before session    Home Living                      Prior Function            PT Goals (current goals can now be found in the care plan section) Acute Rehab PT Goals Patient Stated Goal: be able to take care of myself- I am the doer PT Goal Formulation: With patient Time For Goal Achievement: 07/12/14 Potential to Achieve Goals: Good Progress towards PT goals: Progressing toward goals    Frequency  7X/week    PT Plan Current plan remains appropriate    Co-evaluation             End of Session Equipment Utilized During Treatment: Gait belt Activity Tolerance: Patient tolerated treatment well Patient left: in bed;with call bell/phone within reach     Time: 1016-1048 PT Time Calculation (min) (ACUTE ONLY): 32 min  Charges:  $Gait Training: 23-37 mins                    G Codes:  Whitfield Dulay 07/06/2014, 12:54 PM

## 2014-07-06 NOTE — Progress Notes (Signed)
Occupational Therapy Treatment Patient Details Name: Marilyn Haas MRN: [id number] DOB: [birth year] Today's Date: 07/06/2014    History of present illness LEFT CONVERSION TO TOTAL HIP ARTHROPLASTY    OT comments  Sister will A at home  Follow Up Recommendations  Home health OT    Equipment Recommendations  3 in 1 bedside comode;Other (comment) (pt will decide- may have one)    Recommendations for Other Services      Precautions / Restrictions Precautions Precautions: Posterior Hip;Fall Precaution Booklet Issued: Yes (comment) Precaution Comments: Sign hung in room, All precautions reviewed - Pt recalled 1/3 without cues Restrictions Weight Bearing Restrictions: No Other Position/Activity Restrictions: WBAT       Mobility Bed Mobility Overal bed mobility: Needs Assistance Bed Mobility: Sit to Supine     Supine to sit: Supervision Sit to supine: Supervision      Transfers Overall transfer level: Needs assistance Equipment used: Rolling walker (2 wheeled) Transfers: Sit to/from Stand Sit to Stand: Min guard         General transfer comment: Vc for hand placement and LE management    Balance                                   ADL       Grooming: Standing;Set up               Lower Body Dressing: Min guard;Sit to/from stand;Cueing for safety;With adaptive equipment;Adhering to hip precautions   Toilet Transfer: Min guard;Regular Toilet;RW             General ADL Comments: educated in AE. pt has all AE except sock aide in which sister will obtain      Vision                     Perception     Praxis      Cognition   Behavior During Therapy:  [facility] for tasks assessed/performed Overall Cognitive Status: Within Functional Limits for tasks assessed                       Extremity/Trunk Assessment               Exercises     Shoulder Instructions       General Comments      Pertinent  Vitals/ Pain       Pain Score: 1  Pain Location: L hip Pain Descriptors / Indicators: Aching;Sore Pain Intervention(s): Monitored during session  Home Living                                          Prior Functioning/Environment              Frequency Min 2X/week     Progress Toward Goals  OT Goals(current goals can now be found in the care plan section)      progressing  Plan      Co-evaluation                 End of Session Equipment Utilized During Treatment: Rolling walker   Activity Tolerance Patient tolerated treatment well   Patient Left in bed   Nurse Communication Mobility status        Time: 9528-4132 OT Time Calculation (min): 30 min  Charges: OT General Charges $OT Visit: 1 Procedure OT Treatments $Self Care/Home Management : 23-37 mins  Laylee Schooley, [place] 07/06/2014, 10:12 AM

## 2014-07-08 DIAGNOSIS — M1990 Unspecified osteoarthritis, unspecified site: Secondary | ICD-10-CM | POA: Diagnosis not present

## 2014-07-08 DIAGNOSIS — F419 Anxiety disorder, unspecified: Secondary | ICD-10-CM | POA: Diagnosis not present

## 2014-07-08 DIAGNOSIS — H9193 Unspecified hearing loss, bilateral: Secondary | ICD-10-CM | POA: Diagnosis not present

## 2014-07-08 DIAGNOSIS — I10 Essential (primary) hypertension: Secondary | ICD-10-CM | POA: Diagnosis not present

## 2014-07-08 DIAGNOSIS — F329 Major depressive disorder, single episode, unspecified: Secondary | ICD-10-CM | POA: Diagnosis not present

## 2014-07-08 DIAGNOSIS — Z4732 Aftercare following explantation of hip joint prosthesis: Secondary | ICD-10-CM | POA: Diagnosis not present

## 2014-07-09 DIAGNOSIS — M1990 Unspecified osteoarthritis, unspecified site: Secondary | ICD-10-CM | POA: Diagnosis not present

## 2014-07-09 DIAGNOSIS — F329 Major depressive disorder, single episode, unspecified: Secondary | ICD-10-CM | POA: Diagnosis not present

## 2014-07-09 DIAGNOSIS — Z4732 Aftercare following explantation of hip joint prosthesis: Secondary | ICD-10-CM | POA: Diagnosis not present

## 2014-07-09 DIAGNOSIS — I10 Essential (primary) hypertension: Secondary | ICD-10-CM | POA: Diagnosis not present

## 2014-07-09 DIAGNOSIS — F419 Anxiety disorder, unspecified: Secondary | ICD-10-CM | POA: Diagnosis not present

## 2014-07-09 DIAGNOSIS — H9193 Unspecified hearing loss, bilateral: Secondary | ICD-10-CM | POA: Diagnosis not present

## 2014-07-12 DIAGNOSIS — M1990 Unspecified osteoarthritis, unspecified site: Secondary | ICD-10-CM | POA: Diagnosis not present

## 2014-07-12 DIAGNOSIS — Z4732 Aftercare following explantation of hip joint prosthesis: Secondary | ICD-10-CM | POA: Diagnosis not present

## 2014-07-12 DIAGNOSIS — F419 Anxiety disorder, unspecified: Secondary | ICD-10-CM | POA: Diagnosis not present

## 2014-07-12 DIAGNOSIS — H9193 Unspecified hearing loss, bilateral: Secondary | ICD-10-CM | POA: Diagnosis not present

## 2014-07-12 DIAGNOSIS — F329 Major depressive disorder, single episode, unspecified: Secondary | ICD-10-CM | POA: Diagnosis not present

## 2014-07-12 DIAGNOSIS — I10 Essential (primary) hypertension: Secondary | ICD-10-CM | POA: Diagnosis not present

## 2014-07-16 DIAGNOSIS — H9193 Unspecified hearing loss, bilateral: Secondary | ICD-10-CM | POA: Diagnosis not present

## 2014-07-16 DIAGNOSIS — F329 Major depressive disorder, single episode, unspecified: Secondary | ICD-10-CM | POA: Diagnosis not present

## 2014-07-16 DIAGNOSIS — I10 Essential (primary) hypertension: Secondary | ICD-10-CM | POA: Diagnosis not present

## 2014-07-16 DIAGNOSIS — F419 Anxiety disorder, unspecified: Secondary | ICD-10-CM | POA: Diagnosis not present

## 2014-07-16 DIAGNOSIS — M1990 Unspecified osteoarthritis, unspecified site: Secondary | ICD-10-CM | POA: Diagnosis not present

## 2014-07-16 DIAGNOSIS — Z4732 Aftercare following explantation of hip joint prosthesis: Secondary | ICD-10-CM | POA: Diagnosis not present

## 2014-07-17 DIAGNOSIS — Z96642 Presence of left artificial hip joint: Secondary | ICD-10-CM | POA: Diagnosis not present

## 2014-07-17 DIAGNOSIS — Z471 Aftercare following joint replacement surgery: Secondary | ICD-10-CM | POA: Diagnosis not present

## 2014-07-19 DIAGNOSIS — F419 Anxiety disorder, unspecified: Secondary | ICD-10-CM | POA: Diagnosis not present

## 2014-07-19 DIAGNOSIS — I10 Essential (primary) hypertension: Secondary | ICD-10-CM | POA: Diagnosis not present

## 2014-07-19 DIAGNOSIS — F329 Major depressive disorder, single episode, unspecified: Secondary | ICD-10-CM | POA: Diagnosis not present

## 2014-07-19 DIAGNOSIS — Z4732 Aftercare following explantation of hip joint prosthesis: Secondary | ICD-10-CM | POA: Diagnosis not present

## 2014-07-19 DIAGNOSIS — H9193 Unspecified hearing loss, bilateral: Secondary | ICD-10-CM | POA: Diagnosis not present

## 2014-07-19 DIAGNOSIS — M1990 Unspecified osteoarthritis, unspecified site: Secondary | ICD-10-CM | POA: Diagnosis not present

## 2014-08-07 DIAGNOSIS — Z471 Aftercare following joint replacement surgery: Secondary | ICD-10-CM | POA: Diagnosis not present

## 2014-08-07 DIAGNOSIS — Z96642 Presence of left artificial hip joint: Secondary | ICD-10-CM | POA: Diagnosis not present

## 2014-09-11 DIAGNOSIS — Z96642 Presence of left artificial hip joint: Secondary | ICD-10-CM | POA: Diagnosis not present

## 2014-09-11 DIAGNOSIS — Z471 Aftercare following joint replacement surgery: Secondary | ICD-10-CM | POA: Diagnosis not present

## 2014-09-26 ENCOUNTER — Telehealth (INDEPENDENT_AMBULATORY_CARE_PROVIDER_SITE_OTHER): Payer: Self-pay | Admitting: *Deleted

## 2014-09-26 NOTE — Telephone Encounter (Signed)
Patient leaves voice mail for an appt.  Message time 10:28 am  She is having issues controlling her bowels.

## 2014-09-28 NOTE — Telephone Encounter (Signed)
Apt has been scheduled for 10/30/14 with Dr. Laural Golden. An earlier apt was offered and patient refused.

## 2014-10-04 NOTE — Telephone Encounter (Signed)
She called today at 12:13 pm  She had gotten a call from [facility] today and she is so confused.  She called # back and Primary Care and they told her she had an appt sometime in July.  Needs a call back to explain--again she said she was so confused.

## 2014-10-05 NOTE — Telephone Encounter (Signed)
Called patient to confirm apt date and time. Voices understood.

## 2014-10-08 DIAGNOSIS — R208 Other disturbances of skin sensation: Secondary | ICD-10-CM | POA: Diagnosis not present

## 2014-10-08 DIAGNOSIS — G894 Chronic pain syndrome: Secondary | ICD-10-CM | POA: Diagnosis not present

## 2014-10-08 DIAGNOSIS — Z6821 Body mass index (BMI) 21.0-21.9, adult: Secondary | ICD-10-CM | POA: Diagnosis not present

## 2014-10-08 DIAGNOSIS — G4700 Insomnia, unspecified: Secondary | ICD-10-CM | POA: Diagnosis not present

## 2014-10-08 DIAGNOSIS — F419 Anxiety disorder, unspecified: Secondary | ICD-10-CM | POA: Diagnosis not present

## 2014-10-08 DIAGNOSIS — Z1389 Encounter for screening for other disorder: Secondary | ICD-10-CM | POA: Diagnosis not present

## 2014-10-22 ENCOUNTER — Encounter: Payer: Self-pay | Admitting: Internal Medicine

## 2014-10-22 ENCOUNTER — Telehealth: Payer: Self-pay | Admitting: Internal Medicine

## 2014-10-26 NOTE — Telephone Encounter (Signed)
Close encounter 

## 2014-10-30 ENCOUNTER — Ambulatory Visit (INDEPENDENT_AMBULATORY_CARE_PROVIDER_SITE_OTHER): Payer: Medicare Other | Admitting: Internal Medicine

## 2014-10-30 ENCOUNTER — Encounter (INDEPENDENT_AMBULATORY_CARE_PROVIDER_SITE_OTHER): Payer: Self-pay | Admitting: Internal Medicine

## 2014-10-30 VITALS — BP 120/80 | HR 72 | Temp 98.3°F | Resp 18 | Ht 65.5 in | Wt 117.1 lb

## 2014-10-30 DIAGNOSIS — K219 Gastro-esophageal reflux disease without esophagitis: Secondary | ICD-10-CM | POA: Diagnosis not present

## 2014-10-30 DIAGNOSIS — R140 Abdominal distension (gaseous): Secondary | ICD-10-CM | POA: Diagnosis not present

## 2014-10-30 DIAGNOSIS — R159 Full incontinence of feces: Secondary | ICD-10-CM | POA: Diagnosis not present

## 2014-10-30 DIAGNOSIS — K589 Irritable bowel syndrome without diarrhea: Secondary | ICD-10-CM

## 2014-10-30 MED ORDER — BENEFIBER DRINK MIX PO PACK: 4.0000 g | PACK | Freq: Every day | ORAL | Status: DC

## 2014-10-30 MED ORDER — LOPERAMIDE HCL 2 MG PO TABS
1.0000 mg | ORAL_TABLET | Freq: Every day | ORAL | Status: DC
Start: 2014-10-30 — End: 2015-10-11

## 2014-10-30 MED ORDER — SIMETHICONE 180 MG PO CAPS: 1.0000 | ORAL_CAPSULE | Freq: Three times a day (TID) | ORAL | Status: DC | PRN

## 2014-10-30 NOTE — Patient Instructions (Addendum)
Kegel exercises 2-3 times a day as discussed. Keep symptom diary as to frequency and consistency of stools until next office visit. Follow-up with Collene Mares PA-C regarding poorly controlled depression. Activia yogurt one cup daily. Will schedule colonoscopy later this year when you're ready

## 2014-10-30 NOTE — Progress Notes (Signed)
Presenting complaint;  Follow-up for GERD and dysphagia. Patient complains of bloating diarrhea and incontinence.  Subjective:  Patient is 74 year old Caucasian female who is here for scheduled visit. She was last seen in November 2015 when she had EGD with ED. She had high-grade Schatzki's ring and moderate-sized sliding hiatal hernia. She had 2 small gastric polyps which were biopsied internal to be hyperplastic. Patient states her swallowing has improved great deal. She has occasional difficulty with large pills or meat but she is not had any episode of food impaction. She rarely experiences heartburn. She has lost 7 pounds since her last visit. She states most of her weight loss occurred when she had left hip surgery in April this year. She states she is gained a few pounds back. Immediately after surgery she developed constipation but now she has diarrhea with 3-4 loose stools per day. She also complains of bloating and eventually passes flatus small amount of liquid stool also comes out. She's had multiple accidents. She is is using pad and having to change it 3-4 times every day. She denies rectal bleeding or melena. She feels her depression is not well controlled. She states she is worried about her children and her husband is high maintenance. Last colonoscopy was April 2008 with removal of single small polyp which was tubular adenoma. She has not undergone follow-up exam.     Current Medications: Outpatient Encounter Prescriptions as of 10/30/2014  Medication Sig  . ALPRAZolam (XANAX) 0.5 MG tablet Take 0.5 mg by mouth at bedtime as needed for anxiety.  . citalopram (CELEXA) 40 MG tablet Take 40 mg by mouth daily.  . diazepam (VALIUM) 5 MG tablet Take 5 mg by mouth as needed.   . hyoscyamine (LEVSIN SL) 0.125 MG SL tablet Place 1 tablet (0.125 mg total) under the tongue every 6 (six) hours as needed. (Patient taking differently: Place 0.125 mg under the tongue every 6 (six) hours as  needed for cramping. )  . pantoprazole (PROTONIX) 40 MG tablet TAKE ONE (1) TABLET EACH DAY  . simvastatin (ZOCOR) 40 MG tablet TAKE ONE TABLET BY MOUTH EVERY NIGHT AT BEDTIME              . [DISCONTINUED] methocarbamol (ROBAXIN) 500 MG tablet Take 1 tablet (500 mg total) by mouth every 6 (six) hours as needed for muscle spasms. (Patient not taking: Reported on 10/30/2014)  . [DISCONTINUED] oxyCODONE (OXY IR/ROXICODONE) 5 MG immediate release tablet Take 1-2 tablets (5-10 mg total) by mouth every 3 (three) hours as needed for moderate pain, severe pain or breakthrough pain. (Patient not taking: Reported on 10/30/2014)  . [DISCONTINUED] rivaroxaban (XARELTO) 10 MG TABS tablet Take 1 tablet (10 mg total) by mouth daily with breakfast. Take Xarelto for two and a half more weeks, then discontinue Xarelto. Once the patient has completed the Xarelto, they may resume the 81 mg Aspirin. (Patient not taking: Reported on 10/30/2014)  . [DISCONTINUED] traMADol (ULTRAM) 50 MG tablet Take 1-2 tablets (50-100 mg total) by mouth every 12 (twelve) hours as needed (mild pain). (Patient not taking: Reported on 10/30/2014)   No facility-administered encounter medications on file as of 10/30/2014.    Objective: Blood pressure 120/80, pulse 72, temperature 98.3 F (36.8 C), temperature source Oral, resp. rate 18, height 5' 5.5" (1.664 m), weight 117 lb 1.6 oz (53.116 kg). Patient is alert and in no acute distress. She was quite emotional and in tears and talking about her family members. Conjunctiva is pink. Sclera is nonicteric  Oropharyngeal mucosa is normal. No neck masses or thyromegaly noted. Cardiac exam with regular rhythm normal S1 and S2. No murmur or gallop noted. Lungs are clear to auscultation. Abdomen is symmetrical. Bowel sounds are normal. On palpation abdomen is soft and nontender without organomegaly or masses. Rectal examination reveals normal sphincter tone. There is scant amount of stool in the  vault and it is guaiac negative. No LE edema or clubbing noted.    Assessment:  #1. Dysphagia secondary to Schatzki's ring. Dysphagia has greatly improved since esophageal dilation back in November 2015. Patient must chew food thoroughly every time she cyst down to eat. #2. GERD. Heartburns well controlled with therapy. She has moderate-sized sliding hiatal hernia. #3. Fecal incontinence with loose stools and excessive flatus. Suspect symptoms are due to IBS but she could also have small intestinal bacterial overgrowth or low-grade colitis. Reassuring to note that her stool is guaiac negative. #4. History of colonic adenoma on colonoscopy of April 2008.   Plan:  Kegel exercises 2-3 times a day to further strengthen anal sphincter. Imodium OTC 1 mg by mouth every morning. Benefiber 4 g by mouth daily at bedtime. Activia yogurt one cup daily. Patient advised to Mr. Collene Mares PA-C regarding poorly controlled depression. Patient will keep stool diary until office visit in 2 months.

## 2014-11-06 ENCOUNTER — Ambulatory Visit: Payer: Medicare Other | Admitting: Internal Medicine

## 2014-11-08 ENCOUNTER — Encounter (INDEPENDENT_AMBULATORY_CARE_PROVIDER_SITE_OTHER): Payer: Self-pay | Admitting: *Deleted

## 2014-11-21 ENCOUNTER — Other Ambulatory Visit (INDEPENDENT_AMBULATORY_CARE_PROVIDER_SITE_OTHER): Payer: Self-pay | Admitting: Internal Medicine

## 2014-12-13 ENCOUNTER — Encounter: Payer: Self-pay | Admitting: Internal Medicine

## 2014-12-13 ENCOUNTER — Ambulatory Visit (INDEPENDENT_AMBULATORY_CARE_PROVIDER_SITE_OTHER): Payer: Medicare Other | Admitting: Internal Medicine

## 2014-12-13 VITALS — BP 132/86 | HR 88 | Ht 65.0 in | Wt 117.1 lb

## 2014-12-13 DIAGNOSIS — I493 Ventricular premature depolarization: Secondary | ICD-10-CM | POA: Diagnosis not present

## 2014-12-13 DIAGNOSIS — E785 Hyperlipidemia, unspecified: Secondary | ICD-10-CM

## 2014-12-13 DIAGNOSIS — R5383 Other fatigue: Secondary | ICD-10-CM

## 2014-12-13 DIAGNOSIS — R002 Palpitations: Secondary | ICD-10-CM

## 2014-12-13 DIAGNOSIS — Z79899 Other long term (current) drug therapy: Secondary | ICD-10-CM

## 2014-12-13 LAB — LIPID PANEL
Cholesterol: 209 mg/dL — ABNORMAL HIGH (ref 125–200)
HDL: 114 mg/dL (ref >=46)
LDL CALC: 76 mg/dL (ref <130)
Total CHOL/HDL Ratio: 1.8 Ratio (ref <=5.0)
Triglycerides: 95 mg/dL (ref <150)
VLDL: 19 mg/dL (ref <30)

## 2014-12-13 LAB — COMPREHENSIVE METABOLIC PANEL
ALT: 64 U/L — AB (ref 6–29)
AST: 75 U/L — AB (ref 10–35)
Albumin: 4.6 g/dL (ref 3.6–5.1)
Alkaline Phosphatase: 100 U/L (ref 33–130)
BILIRUBIN TOTAL: 0.8 mg/dL (ref 0.2–1.2)
BUN: 11 mg/dL (ref 7–25)
CO2: 28 mmol/L (ref 20–31)
Calcium: 9.2 mg/dL (ref 8.6–10.4)
Chloride: 94 mmol/L — ABNORMAL LOW (ref 98–110)
Creat: 0.77 mg/dL (ref 0.60–0.93)
GLUCOSE: 81 mg/dL (ref 65–99)
Potassium: 4 mmol/L (ref 3.5–5.3)
Sodium: 131 mmol/L — ABNORMAL LOW (ref 135–146)
Total Protein: 7.5 g/dL (ref 6.1–8.1)

## 2014-12-13 LAB — MAGNESIUM: Magnesium: 1.9 mg/dL (ref 1.5–2.5)

## 2014-12-13 NOTE — Patient Instructions (Signed)
Medication Instructions:  NONE  Labwork: Your physician recommends that you return for lab work at your earliest [place].  Testing/Procedures: Your physician has requested that you have an echocardiogram. Echocardiography is a painless test that uses sound waves to create images of your heart. It provides your doctor with information about the size and shape of your heart and how well your heart's chambers and valves are working. This procedure takes approximately one hour. There are no restrictions for this procedure.  Follow-Up: Dr Debara Pickett recommends that you schedule a follow-up appointment in 1 year. You will receive a reminder letter in the mail two months in advance. If you don't receive a letter, please call our office to schedule the follow-up appointment.  Any Other Special Instructions Will Be Listed Below (If Applicable).

## 2014-12-14 ENCOUNTER — Other Ambulatory Visit: Payer: Self-pay | Admitting: *Deleted

## 2014-12-14 DIAGNOSIS — R748 Abnormal levels of other serum enzymes: Secondary | ICD-10-CM

## 2014-12-16 DIAGNOSIS — R5383 Other fatigue: Secondary | ICD-10-CM | POA: Insufficient documentation

## 2014-12-16 DIAGNOSIS — I493 Ventricular premature depolarization: Secondary | ICD-10-CM | POA: Insufficient documentation

## 2014-12-16 NOTE — Progress Notes (Signed)
OFFICE NOTE  Chief Complaint:  Left hip pain, routine follow-up  Primary Care Physician: Purvis Kilts, MD  HPI:  Marilyn Haas  is a 74 year old female I am following for hypertension and dyslipidemia. She has also had issues with depression and anxiety.Her hypertension appears to be well controlled. She has had not chest pain, worsening shortness of breath, palpitations, presyncope or syncopal symptoms. Her main complaint is some left hip pain. Apparently she had fractured her left hip in the past and underwent a ball replacement, but did not have total joint revision. Since that time she's had increasing difficulty in using her left hip to the point where she is in significant pain. She was told that she may need a total hip replacement/revision. She is considering seeing Dr. Maureen Ralphs for his opinion for surgery in the near future.  Marilyn Haas returns today he is feeling well. She denies any worsening shortness of breath or chest pain. She was told that she needed hip replacement however he is putting that off. Cholesterol is well-controlled on her current medicine regimen. Her particle number was [id number] at her last office visit in December 2014.  Marilyn Haas returns today for follow-up. She recently had left hip replacement by Dr. Maureen Ralphs. She is doing fairly well with that, but is fatigued.  She occasionally gets some leg swelling. She denies palpitations, however, EKG today shows new PVC's.  PMHx:  Past Medical History  Diagnosis Date  . Dysphagia   . Hiatal hernia   . Hyperlipidemia   . Colonic polyp     adenomatous poly. No high grade dysplasia or invasive malignancy. 07/06/2006  . Depression   . Anxiety   . Impaired hearing     bilat   . Hypertension     pt states is currently not on medication   . Pneumonia     hx of   . Asthma     hx of in childhood   . GERD (gastroesophageal reflux disease)   . Arthritis   . Anemia     past hx of acute blood loss anemia   .  Irritable bowel syndrome   . Scarlet fever     hx of in childhood   . Esophageal stricture     hx of   . History of blood transfusion     following left hip fx/hemiarthroplasty     Past Surgical History  Procedure Laterality Date  . Hip fracture surgery      05/2009 left hip   . Esophagogastroduodenoscopy N/A 01/26/2014    Procedure: ESOPHAGOGASTRODUODENOSCOPY (EGD);  Surgeon: Rogene Houston, MD;  Location: [facility];  Service: Endoscopy;  Laterality: N/A;  1030  . Maloney dilation N/A 01/26/2014    Procedure: Venia Minks DILATION;  Surgeon: Rogene Houston, MD;  Location: [facility];  Service: Endoscopy;  Laterality: N/A;  . Colonscopy     . Conversion to total hip Left 07/04/2014    Procedure: LEFT CONVERSION TO TOTAL HIP ARTHROPLASTY;  Surgeon: Gaynelle Arabian, MD;  Location: [facility];  Service: Orthopedics;  Laterality: Left;    FAMHx:  Family History  Problem Relation Age of Onset  . Heart failure Mother   . Diabetes Mother   . Hypertension Mother   . Heart disease Sister     had MI at age42    SOCHx:   reports that she quit smoking about 53 years ago. Her smoking use included Cigarettes. She has a 1 pack-year smoking history.  She has never used smokeless tobacco. She reports that she drinks alcohol. She reports that she does not use illicit drugs.  ALLERGIES:  Allergies  Allergen Reactions  . Avelox [Moxifloxacin Hcl In Nacl] Other (See Comments)    "go crazy"    ROS: A comprehensive review of systems was negative except for: Constitutional: positive for fatigue  HOME MEDS: Current Outpatient Prescriptions  Medication Sig Dispense Refill  . ALPRAZolam (XANAX) 0.5 MG tablet Take 0.5 mg by mouth at bedtime as needed for anxiety.    . citalopram (CELEXA) 40 MG tablet Take 40 mg by mouth daily.    . diazepam (VALIUM) 5 MG tablet Take 5 mg by mouth as needed.   2  . HYDROcodone-acetaminophen (NORCO) 10-325 MG tablet Take 1 tablet by mouth every 6 (six) hours as  needed.  0  . hyoscyamine (LEVSIN SL) 0.125 MG SL tablet Place 1 tablet (0.125 mg total) under the tongue every 6 (six) hours as needed. (Patient taking differently: Place 0.125 mg under the tongue every 6 (six) hours as needed for cramping. ) 60 tablet 0  . loperamide (IMODIUM A-D) 2 MG tablet Take 0.5 tablets (1 mg total) by mouth daily. 1 tablet 0  . pantoprazole (PROTONIX) 40 MG tablet TAKE ONE (1) TABLET EACH DAY 30 tablet 5  . Simethicone 180 MG CAPS Take 1 capsule (180 mg total) by mouth 3 (three) times daily as needed.  0  . simvastatin (ZOCOR) 40 MG tablet TAKE ONE TABLET BY MOUTH EVERY NIGHT AT BEDTIME 30 tablet 11  . Wheat Dextrin (BENEFIBER DRINK MIX) PACK Take 4 g by mouth at bedtime.     No current facility-administered medications for this visit.    LABS/IMAGING: No results found for this or any previous visit (from the past 48 hour(s)). No results found.  VITALS: BP 132/86 mmHg  Pulse 88  Ht 5\' 5"  (1.651 m)  Wt 117 lb 1.6 oz (53.116 kg)  BMI 19.49 kg/m2  EXAM: General appearance: alert and no distress Neck: no carotid bruit and no JVD Lungs: clear to auscultation bilaterally Heart: normal apical impulse Abdomen: soft, non-tender; bowel sounds normal; no masses,  no organomegaly Extremities: extremities normal, atraumatic, no cyanosis or edema Pulses: 2+ and symmetric Skin: Skin color, texture, turgor normal. No rashes or lesions Neurologic: Grossly normal psych: Mood, affect normal  EKG: Normal sinus rhythm at 88, PVC's  ASSESSMENT: 1. PVC's 2. Hypertension-controlled 3. Dyslipidemia- at goal 4. Depression / anxiety 5. Esophageal stricture status post dilatation 6. Recent total L hip replacement  PLAN: 1.   Marilyn Haas is fatigued - she has not bounced back as well as she would like from her hip replacement. There are new PVC's noted on her EKG today.  I'd like to get and echo and rule-out cardiomyopathy as a possible cause. Will also check metabolic  profile and electrolytes. I'll contact her with the result and otherwise plan to see her back annually with her husband, unless it is abnormal.  Pixie Casino, MD, [facility] Attending Cardiologist [place] 12/16/2014, 10:00 PM

## 2014-12-17 DIAGNOSIS — Z23 Encounter for immunization: Secondary | ICD-10-CM | POA: Diagnosis not present

## 2014-12-17 DIAGNOSIS — D510 Vitamin B12 deficiency anemia due to intrinsic factor deficiency: Secondary | ICD-10-CM | POA: Diagnosis not present

## 2014-12-18 ENCOUNTER — Telehealth: Payer: Self-pay | Admitting: Internal Medicine

## 2014-12-18 NOTE — Telephone Encounter (Signed)
Pt called in stating that her heart rate is around 92 and she wanted to know if she could take a Xanax to help. Please f/u with pt  Thanks

## 2014-12-18 NOTE — Telephone Encounter (Signed)
Spoke with patient and told her that her HR is normal (she reports her SBP was in the 100s). Informed her her VS are stable. She states she has not been sleeping well as her sinuses are messed up. She asked if she could take xanax. Informed her she can take this as directed for anxiety and to help sleep.   She was told she had extra beats on her EKG at her last visit and has an echo ordered and this is making her nervous.

## 2014-12-24 ENCOUNTER — Other Ambulatory Visit (HOSPITAL_COMMUNITY): Payer: Medicare Other

## 2014-12-25 ENCOUNTER — Other Ambulatory Visit: Payer: Self-pay

## 2014-12-25 ENCOUNTER — Ambulatory Visit (HOSPITAL_COMMUNITY): Payer: Medicare Other | Attending: Cardiovascular Disease

## 2014-12-25 DIAGNOSIS — Z87891 Personal history of nicotine dependence: Secondary | ICD-10-CM | POA: Insufficient documentation

## 2014-12-25 DIAGNOSIS — Z8249 Family history of ischemic heart disease and other diseases of the circulatory system: Secondary | ICD-10-CM | POA: Insufficient documentation

## 2014-12-25 DIAGNOSIS — I10 Essential (primary) hypertension: Secondary | ICD-10-CM | POA: Diagnosis not present

## 2014-12-25 DIAGNOSIS — E785 Hyperlipidemia, unspecified: Secondary | ICD-10-CM | POA: Insufficient documentation

## 2014-12-25 DIAGNOSIS — I493 Ventricular premature depolarization: Secondary | ICD-10-CM | POA: Diagnosis not present

## 2014-12-25 DIAGNOSIS — R5383 Other fatigue: Secondary | ICD-10-CM | POA: Diagnosis not present

## 2014-12-25 DIAGNOSIS — I517 Cardiomegaly: Secondary | ICD-10-CM | POA: Insufficient documentation

## 2014-12-25 DIAGNOSIS — I351 Nonrheumatic aortic (valve) insufficiency: Secondary | ICD-10-CM | POA: Diagnosis not present

## 2014-12-31 ENCOUNTER — Telehealth: Payer: Self-pay | Admitting: Internal Medicine

## 2014-12-31 NOTE — Telephone Encounter (Signed)
Pt called in stating that she had an Echo done on 10/11 and she would like to know if those results were available. Please call  Thanks

## 2014-12-31 NOTE — Telephone Encounter (Signed)
Informed pt results pending provider signature. She voiced understanding, thanks for the update call.

## 2014-12-31 NOTE — Telephone Encounter (Signed)
Duplicate encounter

## 2014-12-31 NOTE — Telephone Encounter (Signed)
Pt called in stating that she had an Echo done on 10/11 and she would like to know if those results were available.   Thanks

## 2015-01-23 ENCOUNTER — Other Ambulatory Visit: Payer: Self-pay | Admitting: Internal Medicine

## 2015-01-23 DIAGNOSIS — G4701 Insomnia due to medical condition: Secondary | ICD-10-CM | POA: Diagnosis not present

## 2015-01-23 DIAGNOSIS — R748 Abnormal levels of other serum enzymes: Secondary | ICD-10-CM | POA: Diagnosis not present

## 2015-01-23 DIAGNOSIS — F419 Anxiety disorder, unspecified: Secondary | ICD-10-CM | POA: Diagnosis not present

## 2015-01-23 DIAGNOSIS — Z1389 Encounter for screening for other disorder: Secondary | ICD-10-CM | POA: Diagnosis not present

## 2015-01-23 DIAGNOSIS — G894 Chronic pain syndrome: Secondary | ICD-10-CM | POA: Diagnosis not present

## 2015-01-23 DIAGNOSIS — Z6820 Body mass index (BMI) 20.0-20.9, adult: Secondary | ICD-10-CM | POA: Diagnosis not present

## 2015-01-24 LAB — HEPATIC FUNCTION PANEL
ALT: 10 IU/L (ref 0–32)
AST: 17 IU/L (ref 0–40)
Albumin: 4.5 g/dL (ref 3.5–4.8)
Alkaline Phosphatase: 67 IU/L (ref 39–117)
Bilirubin Total: 0.4 mg/dL (ref 0.0–1.2)
Bilirubin, Direct: 0.11 mg/dL (ref 0.00–0.40)
TOTAL PROTEIN: 7.2 g/dL (ref 6.0–8.5)

## 2015-01-30 ENCOUNTER — Telehealth: Payer: Self-pay | Admitting: Internal Medicine

## 2015-01-30 NOTE — Telephone Encounter (Signed)
INFORMED PATIENT DR HILTY REVIEWED LABS.  HE WOULD LIKE FOR PATIENT TO CONTINUE TO HOLD SIMVASTATIN  FOR NOW PATIENT VERBALIZED UNDERSTANDING

## 2015-01-30 NOTE — Telephone Encounter (Signed)
Pt is returning Jenna's call in regards to some lab results. Please f/u with pt  Thanks

## 2015-02-12 ENCOUNTER — Ambulatory Visit (INDEPENDENT_AMBULATORY_CARE_PROVIDER_SITE_OTHER): Payer: Medicare Other | Admitting: Internal Medicine

## 2015-03-22 DIAGNOSIS — J301 Allergic rhinitis due to pollen: Secondary | ICD-10-CM | POA: Diagnosis not present

## 2015-03-22 DIAGNOSIS — Z6821 Body mass index (BMI) 21.0-21.9, adult: Secondary | ICD-10-CM | POA: Diagnosis not present

## 2015-03-22 DIAGNOSIS — Z1389 Encounter for screening for other disorder: Secondary | ICD-10-CM | POA: Diagnosis not present

## 2015-03-22 DIAGNOSIS — J019 Acute sinusitis, unspecified: Secondary | ICD-10-CM | POA: Diagnosis not present

## 2015-04-02 ENCOUNTER — Other Ambulatory Visit (HOSPITAL_COMMUNITY): Payer: Self-pay | Admitting: Physician Assistant

## 2015-04-02 DIAGNOSIS — Z79899 Other long term (current) drug therapy: Secondary | ICD-10-CM

## 2015-04-02 DIAGNOSIS — Z1231 Encounter for screening mammogram for malignant neoplasm of breast: Secondary | ICD-10-CM

## 2015-04-15 ENCOUNTER — Other Ambulatory Visit (HOSPITAL_COMMUNITY): Payer: Medicare Other

## 2015-04-15 ENCOUNTER — Ambulatory Visit (HOSPITAL_COMMUNITY): Payer: Medicare Other

## 2015-05-30 DIAGNOSIS — Z96642 Presence of left artificial hip joint: Secondary | ICD-10-CM | POA: Diagnosis not present

## 2015-05-30 DIAGNOSIS — Z471 Aftercare following joint replacement surgery: Secondary | ICD-10-CM | POA: Diagnosis not present

## 2015-05-30 DIAGNOSIS — M545 Low back pain: Secondary | ICD-10-CM | POA: Diagnosis not present

## 2015-06-04 DIAGNOSIS — G8929 Other chronic pain: Secondary | ICD-10-CM | POA: Diagnosis not present

## 2015-06-04 DIAGNOSIS — M41127 Adolescent idiopathic scoliosis, lumbosacral region: Secondary | ICD-10-CM | POA: Diagnosis not present

## 2015-06-04 DIAGNOSIS — M545 Low back pain: Secondary | ICD-10-CM | POA: Diagnosis not present

## 2015-06-04 DIAGNOSIS — M5136 Other intervertebral disc degeneration, lumbar region: Secondary | ICD-10-CM | POA: Diagnosis not present

## 2015-07-19 ENCOUNTER — Other Ambulatory Visit (INDEPENDENT_AMBULATORY_CARE_PROVIDER_SITE_OTHER): Payer: Self-pay | Admitting: Internal Medicine

## 2015-07-19 NOTE — Telephone Encounter (Signed)
Patient will need an appointment prior to further refills. 

## 2015-07-22 NOTE — Telephone Encounter (Signed)
Patient was called and advised that she needed an appointment before anymore refills, she was given an appointment for 10/22/15 at 3:30pm.

## 2015-07-31 ENCOUNTER — Encounter (HOSPITAL_COMMUNITY): Payer: Self-pay

## 2015-07-31 ENCOUNTER — Emergency Department (HOSPITAL_COMMUNITY): Payer: Medicare Other

## 2015-07-31 ENCOUNTER — Emergency Department (HOSPITAL_COMMUNITY)
Admission: EM | Admit: 2015-07-31 | Discharge: 2015-07-31 | Disposition: A | Discharge status: Home / Self Care | Payer: Medicare Other | Attending: Emergency Medicine | Admitting: Emergency Medicine

## 2015-07-31 DIAGNOSIS — R42 Dizziness and giddiness: Secondary | ICD-10-CM

## 2015-07-31 DIAGNOSIS — M1990 Unspecified osteoarthritis, unspecified site: Secondary | ICD-10-CM | POA: Diagnosis not present

## 2015-07-31 DIAGNOSIS — E785 Hyperlipidemia, unspecified: Secondary | ICD-10-CM | POA: Diagnosis not present

## 2015-07-31 DIAGNOSIS — Z79899 Other long term (current) drug therapy: Secondary | ICD-10-CM | POA: Insufficient documentation

## 2015-07-31 DIAGNOSIS — J45909 Unspecified asthma, uncomplicated: Secondary | ICD-10-CM | POA: Diagnosis not present

## 2015-07-31 DIAGNOSIS — F329 Major depressive disorder, single episode, unspecified: Secondary | ICD-10-CM | POA: Diagnosis not present

## 2015-07-31 DIAGNOSIS — I10 Essential (primary) hypertension: Secondary | ICD-10-CM | POA: Diagnosis not present

## 2015-07-31 DIAGNOSIS — E871 Hypo-osmolality and hyponatremia: Secondary | ICD-10-CM | POA: Diagnosis not present

## 2015-07-31 DIAGNOSIS — Z87891 Personal history of nicotine dependence: Secondary | ICD-10-CM | POA: Insufficient documentation

## 2015-07-31 DIAGNOSIS — I959 Hypotension, unspecified: Secondary | ICD-10-CM | POA: Diagnosis not present

## 2015-07-31 LAB — COMPREHENSIVE METABOLIC PANEL
ALBUMIN: 4.1 g/dL (ref 3.5–5.0)
ALT: 14 U/L (ref 14–54)
AST: 20 U/L (ref 15–41)
Alkaline Phosphatase: 69 U/L (ref 38–126)
Anion gap: 6 (ref 5–15)
BUN: 19 mg/dL (ref 6–20)
CHLORIDE: 96 mmol/L — AB (ref 101–111)
CO2: 27 mmol/L (ref 22–32)
CREATININE: 1.03 mg/dL — AB (ref 0.44–1.00)
Calcium: 8.5 mg/dL — ABNORMAL LOW (ref 8.9–10.3)
GFR calc Af Amer: 60 mL/min — ABNORMAL LOW (ref >60)
GFR calc non Af Amer: 51 mL/min — ABNORMAL LOW (ref >60)
Glucose, Bld: 100 mg/dL — ABNORMAL HIGH (ref 65–99)
Potassium: 4.1 mmol/L (ref 3.5–5.1)
SODIUM: 129 mmol/L — AB (ref 135–145)
TOTAL PROTEIN: 7.1 g/dL (ref 6.5–8.1)
Total Bilirubin: 0.3 mg/dL (ref 0.3–1.2)

## 2015-07-31 LAB — CBC WITH DIFFERENTIAL/PLATELET
BASOS ABS: 0 10*3/uL (ref 0.0–0.1)
BASOS PCT: 1 %
Eosinophils Absolute: 0.1 10*3/uL (ref 0.0–0.7)
Eosinophils Relative: 2 %
HEMATOCRIT: 32.4 % — AB (ref 36.0–46.0)
HEMOGLOBIN: 10.9 g/dL — AB (ref 12.0–15.0)
Lymphocytes Relative: 32 %
Lymphs Abs: 1.8 10*3/uL (ref 0.7–4.0)
MCH: 32.4 pg (ref 26.0–34.0)
MCHC: 33.6 g/dL (ref 30.0–36.0)
MCV: 96.4 fL (ref 78.0–100.0)
MONOS PCT: 11 %
Monocytes Absolute: 0.7 10*3/uL (ref 0.1–1.0)
NEUTROS ABS: 3.1 10*3/uL (ref 1.7–7.7)
NEUTROS PCT: 54 %
Platelets: 225 10*3/uL (ref 150–400)
RBC: 3.36 MIL/uL — AB (ref 3.87–5.11)
RDW: 12.4 % (ref 11.5–15.5)
WBC: 5.8 10*3/uL (ref 4.0–10.5)

## 2015-07-31 LAB — URINALYSIS, ROUTINE W REFLEX MICROSCOPIC
Bilirubin Urine: NEGATIVE
GLUCOSE, UA: NEGATIVE mg/dL
KETONES UR: NEGATIVE mg/dL
NITRITE: NEGATIVE
PROTEIN: NEGATIVE mg/dL
Specific Gravity, Urine: <1.005 — ABNORMAL LOW (ref 1.005–1.030)
pH: 6.5 (ref 5.0–8.0)

## 2015-07-31 LAB — URINE MICROSCOPIC-ADD ON

## 2015-07-31 LAB — TROPONIN I

## 2015-07-31 LAB — CBG MONITORING, ED: Glucose-Capillary: 101 mg/dL — ABNORMAL HIGH (ref 65–99)

## 2015-07-31 MED ORDER — MECLIZINE HCL 12.5 MG PO TABS
25.0000 mg | ORAL_TABLET | Freq: Once | ORAL | Status: AC
  Administered 2015-07-31: 25 mg via ORAL
  Filled 2015-07-31: qty 2

## 2015-07-31 MED ORDER — MECLIZINE HCL 25 MG PO TABS: 25.0000 mg | ORAL_TABLET | Freq: Three times a day (TID) | ORAL | Status: DC | PRN

## 2015-07-31 MED ORDER — MECLIZINE HCL 25 MG PO TABS: 25.0000 mg | ORAL_TABLET | Freq: Two times a day (BID) | ORAL | Status: DC | PRN

## 2015-07-31 NOTE — ED Provider Notes (Signed)
CSN: [id number]     Arrival date & time 07/31/15  1819 History   First MD Initiated Contact with Patient 07/31/15 1848     Chief Complaint  Patient presents with  . Dizziness      HPI Pt was seen at 1900. Per pt, c/o gradual onset and persistence of constant "dizziness" since waking up this morning. Pt describes the dizziness as "everything is moving around." Has been associated with "feeling cold." States a friend took her BP and it was "80/49." Denies CP/palpitations, no SOB/cough, no abd pain, no N/V/D, no objective fevers, no visual changes, no focal motor weakness, no tingling/numbness in extremities, no ataxia, no slurred speech, no facial droop.     Past Medical History  Diagnosis Date  . Dysphagia   . Hiatal hernia   . Hyperlipidemia   . Colonic polyp     adenomatous poly. No high grade dysplasia or invasive malignancy. 07/06/2006  . Depression   . Anxiety   . Impaired hearing     bilat   . Hypertension     pt states is currently not on medication   . Pneumonia     hx of   . Asthma     hx of in childhood   . GERD (gastroesophageal reflux disease)   . Arthritis   . Anemia     past hx of acute blood loss anemia   . Irritable bowel syndrome   . Scarlet fever     hx of in childhood   . Esophageal stricture     hx of   . History of blood transfusion     following left hip fx/hemiarthroplasty    Past Surgical History  Procedure Laterality Date  . Hip fracture surgery      05/2009 left hip   . Esophagogastroduodenoscopy N/A 01/26/2014    Procedure: ESOPHAGOGASTRODUODENOSCOPY (EGD);  Surgeon: Rogene Houston, MD;  [facility]: [facility];  Service: Endoscopy;  Laterality: N/A;  1030  . Maloney dilation N/A 01/26/2014    Procedure: Venia Minks DILATION;  Surgeon: Rogene Houston, MD;  Location: [facility];  Service: Endoscopy;  Laterality: N/A;  . Colonscopy     . 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 Heart failure Mother   . Diabetes Mother   . Hypertension Mother   . Heart disease Sister     had MI at age85   Social History  Substance Use Topics  . Smoking status: Former Smoker -- 0.50 packs/day for 2 years    Types: Cigarettes    Quit date: 01/15/1961  . Smokeless tobacco: Never Used  . Alcohol Use: 0.0 oz/week    0 Standard drinks or equivalent per week     Comment: ETOH abuse years ago, occ drinks now    Review of Systems ROS: Statement: All systems negative except as marked or noted in the HPI; Constitutional: Negative for fever and chills. ; ; Eyes: Negative for eye pain, redness and discharge. ; ; ENMT: Negative for ear pain, hoarseness, nasal congestion, sinus pressure and sore throat. ; ; Cardiovascular: Negative for chest pain, palpitations, diaphoresis, dyspnea and peripheral edema. ; ; Respiratory: Negative for cough, wheezing and stridor. ; ; Gastrointestinal: Negative for nausea, vomiting, diarrhea, abdominal pain, blood in stool, hematemesis, jaundice and  rectal bleeding. . ; ; Genitourinary: Negative for dysuria, flank pain and hematuria. ; ; Musculoskeletal: Negative for back pain and neck pain. Negative for swelling and trauma.; ; Skin: Negative for pruritus, rash, abrasions, blisters, bruising and skin lesion.; ; Neuro: +"dizziness." Negative for headache and neck stiffness. Negative for weakness, altered level of consciousness, altered mental status, extremity weakness, paresthesias, involuntary movement, seizure and syncope.      Allergies  Avelox  Home Medications   Prior to Admission medications   Medication Sig Start Date End Date Taking? Authorizing Provider  citalopram (CELEXA) 40 MG tablet Take 40 mg by mouth daily.   Yes Historical Provider, MD  pantoprazole (PROTONIX) 40 MG tablet TAKE ONE (1) TABLET EACH DAY 07/19/15  Yes  Rogene Houston, MD  ALPRAZolam (XANAX) 0.5 MG tablet Take 0.5 mg by mouth at bedtime as needed for anxiety.    Historical Provider, MD  HYDROcodone-acetaminophen (NORCO) 10-325 MG tablet Take 1 tablet by mouth every 6 (six) hours as needed for moderate pain.  11/19/14   Historical Provider, MD  hyoscyamine (LEVSIN SL) 0.125 MG SL tablet Place 1 tablet (0.125 mg total) under the tongue every 6 (six) hours as needed. Patient not taking: Reported on 07/31/2015 01/15/14   Rogene Houston, MD  loperamide (IMODIUM A-D) 2 MG tablet Take 0.5 tablets (1 mg total) by mouth daily. Patient not taking: Reported on 07/31/2015 10/30/14   Rogene Houston, MD  Simethicone 180 MG CAPS Take 1 capsule (180 mg total) by mouth 3 (three) times daily as needed. Patient taking differently: Take 1 capsule by mouth 3 (three) times daily as needed (gas).  10/30/14   Rogene Houston, MD  simvastatin (ZOCOR) 40 MG tablet TAKE ONE TABLET BY MOUTH EVERY NIGHT AT BEDTIME Patient not taking: Reported on 07/31/2015 02/26/14   Pixie Casino, MD  Wheat Dextrin (BENEFIBER DRINK MIX) PACK Take 4 g by mouth at bedtime. Patient not taking: Reported on 07/31/2015 10/30/14   Rogene Houston, MD   BP 138/79 mmHg  Pulse 63  Temp(Src) 98.5 F (36.9 C) (Oral)  Resp 12  Ht 5\' 5"  (1.651 m)  Wt 115 lb (52.164 kg)  BMI 19.14 kg/m2  SpO2 97%   19:46 Orthostatic Vital Signs AD  Orthostatic Lying  - BP- Lying: 138/79 mmHg ; Pulse- Lying: 60  Orthostatic Sitting - BP- Sitting: 124/82 mmHg ; Pulse- Sitting: 64  Orthostatic Standing at 0 minutes - BP- Standing at 0 minutes: 131/66 mmHg ; Pulse- Standing at 0 minutes: 67      Physical Exam  1905: Physical examination:  Nursing notes reviewed; Vital signs and O2 SAT reviewed;  Constitutional: Well developed, Well nourished, Well hydrated, In no acute distress; Head:  Normocephalic, atraumatic; Eyes: EOMI, PERRL, No scleral icterus; ENMT: Mouth and pharynx normal, Mucous membranes moist; Neck:  Supple, Full range of motion, No lymphadenopathy; Cardiovascular: Regular rate and rhythm, No gallop; Respiratory: Breath sounds clear & equal bilaterally, No wheezes.  Speaking full sentences with ease, Normal respiratory effort/excursion; Chest: Nontender, Movement normal; Abdomen: Soft, Nontender, Nondistended, Normal bowel sounds; Genitourinary: No CVA tenderness; Extremities: Pulses normal, No tenderness, No edema, No calf edema or asymmetry.; Neuro: AA&Ox3, +HOH, otherwise major CN grossly intact. Speech clear.  No facial droop. +left horizontal end gaze fatigable nystagmus. Grips equal. Strength 5/5 equal bilat UE's and LE's.  DTR 2/4 equal bilat UE's and LE's.  No gross sensory deficits.  Normal cerebellar testing bilat UE's (finger-nose) and LE's (heel-shin)..; Skin:  Color normal, Warm, Dry.   ED Course  Procedures (including critical care time) Labs Review   Imaging Review  I have personally reviewed and evaluated these images and lab results as part of my medical decision-making.   EKG Interpretation   Date/Time:  Wednesday Jul 31 2015 18:31:03 EDT Ventricular Rate:  75 PR Interval:  184 QRS Duration: 82 QT Interval:  406 QTC Calculation: 453 R Axis:   28 Text Interpretation:  Normal sinus rhythm Normal ECG When compared with  ECG of 06/12/2008 No significant change was found Confirmed by [facility]  MD,  Nunzio Cory [phone]) on 07/31/2015 9:08:54 PM      MDM  MDM Reviewed: previous chart, nursing note and vitals Reviewed previous: labs and ECG Interpretation: labs, ECG, x-ray and CT scan   Results for orders placed or performed during the hospital encounter of 07/31/15  CBC with Differential  Result Value Ref Range   WBC 5.8 4.0 - 10.5 K/uL   RBC 3.36 (L) 3.87 - 5.11 MIL/uL   Hemoglobin 10.9 (L) 12.0 - 15.0 g/dL   HCT 32.4 (L) 36.0 - 46.0 %   MCV 96.4 78.0 - 100.0 fL   MCH 32.4 26.0 - 34.0 pg   MCHC 33.6 30.0 - 36.0 g/dL   RDW 12.4 11.5 - 15.5 %   Platelets 225 150 -  400 K/uL   Neutrophils Relative % 54 %   Neutro Abs 3.1 1.7 - 7.7 K/uL   Lymphocytes Relative 32 %   Lymphs Abs 1.8 0.7 - 4.0 K/uL   Monocytes Relative 11 %   Monocytes Absolute 0.7 0.1 - 1.0 K/uL   Eosinophils Relative 2 %   Eosinophils Absolute 0.1 0.0 - 0.7 K/uL   Basophils Relative 1 %   Basophils Absolute 0.0 0.0 - 0.1 K/uL  Troponin I  Result Value Ref Range   Troponin I <0.03 <0.031 ng/mL  Comprehensive metabolic panel  Result Value Ref Range   Sodium 129 (L) 135 - 145 mmol/L   Potassium 4.1 3.5 - 5.1 mmol/L   Chloride 96 (L) 101 - 111 mmol/L   CO2 27 22 - 32 mmol/L   Glucose, Bld 100 (H) 65 - 99 mg/dL   BUN 19 6 - 20 mg/dL   Creatinine, Ser 1.03 (H) 0.44 - 1.00 mg/dL   Calcium 8.5 (L) 8.9 - 10.3 mg/dL   Total Protein 7.1 6.5 - 8.1 g/dL   Albumin 4.1 3.5 - 5.0 g/dL   AST 20 15 - 41 U/L   ALT 14 14 - 54 U/L   Alkaline Phosphatase 69 38 - 126 U/L   Total Bilirubin 0.3 0.3 - 1.2 mg/dL   GFR calc non Af Amer 51 (L) >60 mL/min   GFR calc Af Amer 60 (L) >60 mL/min   Anion gap 6 5 - 15  Urinalysis, Routine w reflex microscopic  Result Value Ref Range   Color, Urine YELLOW YELLOW   APPearance CLEAR CLEAR   Specific Gravity, Urine <1.005 (L) 1.005 - 1.030   pH 6.5 5.0 - 8.0   Glucose, UA NEGATIVE NEGATIVE mg/dL   Hgb urine dipstick TRACE (A) NEGATIVE   Bilirubin Urine NEGATIVE NEGATIVE   Ketones, ur NEGATIVE NEGATIVE mg/dL   Protein, ur NEGATIVE NEGATIVE mg/dL   Nitrite NEGATIVE NEGATIVE   Leukocytes, UA TRACE (A) NEGATIVE  Urine microscopic-add on  Result Value Ref Range   Squamous Epithelial / LPF 6-30 (A) NONE SEEN   WBC, UA 0-5 0 - 5 WBC/hpf  RBC / HPF 0-5 0 - 5 RBC/hpf   Bacteria, UA MANY (A) NONE SEEN   Casts HYALINE CASTS (A) NEGATIVE  CBG monitoring, ED  Result Value Ref Range   Glucose-Capillary 101 (H) 65 - 99 mg/dL   Dg Chest 2 View 07/31/2015  CLINICAL DATA:  Dizzy and lightheaded since waking this morning, hypotension, former smoker, history asthma  EXAM: CHEST  2 VIEW COMPARISON:  09/27/2009 FINDINGS: Upper-normal size of cardiac silhouette. Small hiatal hernia. Mediastinal contours and pulmonary vascularity otherwise normal. Lungs hyperinflated but clear. No pulmonary infiltrate, pleural effusion or pneumothorax. Bones unremarkable. IMPRESSION: Suspected COPD. Hiatal hernia. No acute abnormalities. Electronically Signed   By: Lavonia Dana M.D.   On: 07/31/2015 20:00   Ct Head Wo Contrast 07/31/2015  CLINICAL DATA:  75 year old female with dizziness EXAM: CT HEAD WITHOUT CONTRAST TECHNIQUE: Contiguous axial images were obtained from the base of the skull through the vertex without intravenous contrast. COMPARISON:  Head CT dated 06/09/2008 FINDINGS: The ventricles and sulci are appropriate in size for patient's age. Mild periventricular and deep white matter chronic microvascular ischemic changes noted. There is no acute intracranial hemorrhage. No mass effect or midline shift identified. The visualized paranasal sinuses and mastoid air cells are clear. The calvarium is intact. IMPRESSION: No acute intracranial pathology. Mild chronic microvascular ischemic changes. Electronically Signed   By: Anner Crete M.D.   On: 07/31/2015 20:19    Results for Marilyn, Haas (MRN [id number]) as of 07/31/2015 21:13  Ref. Range 08/09/2012 11:55 06/26/2014 14:00 07/05/2014 04:16 07/06/2014 04:13 07/31/2015 19:32  Hemoglobin Latest Ref Range: 12.0-15.0 g/dL 10.7 (L) 11.7 (L) 9.0 (L) 8.8 (L) 10.9 (L)  HCT Latest Ref Range: 36.0-46.0 % 32.5 (L) 34.9 (L) 24.9 (L) 26.7 (L) 32.4 (L)    Results for Marilyn, Haas (MRN [id number]) as of 07/31/2015 21:13  Ref. Range 06/26/2014 14:00 07/05/2014 04:16 07/06/2014 04:13 12/13/2014 11:32 07/31/2015 19:32  Sodium Latest Ref Range: 135-145 mmol/L 134 (L) 130 (L) 134 (L) 131 (L) 129 (L)  Chloride Latest Ref Range: 101-111 mmol/L 98 97 101 94 (L) 96 (L)  BUN Latest Ref Range: 6-20 mg/dL 7 11 12 11 19   Creatinine Latest Ref Range:  0.44-1.00 mg/dL 0.67 0.77 0.68 0.77 1.03 (H)    2110:  Pt has been ambulatory with steady gait, easy resps. Denies any further "dizziness" after antivert PO. Pt is not orthostatic on VS. Udip appears contaminated; UC pending. H/H per baseline. Na/Cl slightly lower than recent previous, however Na was 124 on 2008 labs. Pt does not want to stay in the ED any longer, has gotten herself dressed, and wants to go home now. Dx and testing d/w pt and family.  Questions answered.  Verb understanding, agreeable to d/c home with outpt f/u.   Francine Graven, DO 08/03/15 [phone]

## 2015-07-31 NOTE — Discharge Instructions (Signed)
Take the prescription as directed. Your sodium level was low again today. Call your regular medical doctor tomorrow to schedule a follow up appointment within the next 2 days to recheck your sodium level.  Return to the Emergency Department immediately sooner if worsening.

## 2015-07-31 NOTE — ED Notes (Signed)
Pt's daughter refuses for orthostatic vital signs and bloodwork to be done and until pt is seen by a doctor.

## 2015-07-31 NOTE — ED Notes (Signed)
Pt reports feeling dizzy and light headed since waking this morning.  PT says her friend checked her bp and it was 80/49.  Pt also reports feeling cold today.

## 2015-08-02 DIAGNOSIS — Z681 Body mass index (BMI) 19 or less, adult: Secondary | ICD-10-CM | POA: Diagnosis not present

## 2015-08-02 DIAGNOSIS — E441 Mild protein-calorie malnutrition: Secondary | ICD-10-CM | POA: Diagnosis not present

## 2015-08-02 DIAGNOSIS — R42 Dizziness and giddiness: Secondary | ICD-10-CM | POA: Diagnosis not present

## 2015-08-02 DIAGNOSIS — Z1389 Encounter for screening for other disorder: Secondary | ICD-10-CM | POA: Diagnosis not present

## 2015-08-02 LAB — URINE CULTURE

## 2015-08-21 DIAGNOSIS — Z6821 Body mass index (BMI) 21.0-21.9, adult: Secondary | ICD-10-CM | POA: Diagnosis not present

## 2015-08-21 DIAGNOSIS — J019 Acute sinusitis, unspecified: Secondary | ICD-10-CM | POA: Diagnosis not present

## 2015-08-21 DIAGNOSIS — Z1389 Encounter for screening for other disorder: Secondary | ICD-10-CM | POA: Diagnosis not present

## 2015-08-21 DIAGNOSIS — R7309 Other abnormal glucose: Secondary | ICD-10-CM | POA: Diagnosis not present

## 2015-08-21 DIAGNOSIS — Z79899 Other long term (current) drug therapy: Secondary | ICD-10-CM | POA: Diagnosis not present

## 2015-08-21 DIAGNOSIS — Z0001 Encounter for general adult medical examination with abnormal findings: Secondary | ICD-10-CM | POA: Diagnosis not present

## 2015-09-23 DIAGNOSIS — K529 Noninfective gastroenteritis and colitis, unspecified: Secondary | ICD-10-CM | POA: Diagnosis not present

## 2015-10-03 ENCOUNTER — Encounter (HOSPITAL_COMMUNITY): Payer: Self-pay | Admitting: Emergency Medicine

## 2015-10-03 ENCOUNTER — Emergency Department (HOSPITAL_COMMUNITY)
Admission: EM | Admit: 2015-10-03 | Discharge: 2015-10-03 | Disposition: A | Discharge status: Home / Self Care | Payer: Medicare Other | Attending: Emergency Medicine | Admitting: Emergency Medicine

## 2015-10-03 DIAGNOSIS — R42 Dizziness and giddiness: Secondary | ICD-10-CM | POA: Diagnosis not present

## 2015-10-03 DIAGNOSIS — R5381 Other malaise: Secondary | ICD-10-CM | POA: Diagnosis not present

## 2015-10-03 DIAGNOSIS — E860 Dehydration: Secondary | ICD-10-CM | POA: Diagnosis not present

## 2015-10-03 DIAGNOSIS — R5383 Other fatigue: Secondary | ICD-10-CM | POA: Diagnosis not present

## 2015-10-03 DIAGNOSIS — K859 Acute pancreatitis without necrosis or infection, unspecified: Secondary | ICD-10-CM | POA: Diagnosis present

## 2015-10-03 LAB — URINALYSIS, ROUTINE W REFLEX MICROSCOPIC
BILIRUBIN URINE: NEGATIVE
Glucose, UA: NEGATIVE mg/dL
HGB URINE DIPSTICK: NEGATIVE
Ketones, ur: NEGATIVE mg/dL
Leukocytes, UA: NEGATIVE
Nitrite: NEGATIVE
PROTEIN: NEGATIVE mg/dL
Specific Gravity, Urine: <1.005 — ABNORMAL LOW (ref 1.005–1.030)
pH: 6.5 (ref 5.0–8.0)

## 2015-10-03 LAB — CBC
HCT: 31 % — ABNORMAL LOW (ref 36.0–46.0)
Hemoglobin: 10.5 g/dL — ABNORMAL LOW (ref 12.0–15.0)
MCH: 31.2 pg (ref 26.0–34.0)
MCHC: 33.9 g/dL (ref 30.0–36.0)
MCV: 92 fL (ref 78.0–100.0)
PLATELETS: 252 10*3/uL (ref 150–400)
RBC: 3.37 MIL/uL — AB (ref 3.87–5.11)
RDW: 12.2 % (ref 11.5–15.5)
WBC: 5.7 10*3/uL (ref 4.0–10.5)

## 2015-10-03 LAB — BASIC METABOLIC PANEL
Anion gap: 8 (ref 5–15)
BUN: 14 mg/dL (ref 6–20)
CALCIUM: 8.5 mg/dL — AB (ref 8.9–10.3)
CO2: 25 mmol/L (ref 22–32)
CREATININE: 0.76 mg/dL (ref 0.44–1.00)
Chloride: 94 mmol/L — ABNORMAL LOW (ref 101–111)
Glucose, Bld: 171 mg/dL — ABNORMAL HIGH (ref 65–99)
Potassium: 3.4 mmol/L — ABNORMAL LOW (ref 3.5–5.1)
SODIUM: 127 mmol/L — AB (ref 135–145)

## 2015-10-03 LAB — CBG MONITORING, ED: GLUCOSE-CAPILLARY: 180 mg/dL — AB (ref 65–99)

## 2015-10-03 MED ORDER — SODIUM CHLORIDE 0.9 % IV SOLN: INTRAVENOUS | Status: DC

## 2015-10-03 MED ORDER — SODIUM CHLORIDE 0.9 % IV BOLUS (SEPSIS)
500.0000 mL | Freq: Once | INTRAVENOUS | Status: AC
  Administered 2015-10-03: 500 mL via INTRAVENOUS

## 2015-10-03 NOTE — ED Provider Notes (Signed)
CSN: [id number]     Arrival date & time 10/03/15  1043 History  By signing my name below, I, Eustaquio Maize, attest that this documentation has been prepared under the direction and in the presence of Daleen Bo, MD. Electronically Signed: Eustaquio Maize, ED Scribe. 10/03/2015. 12:03 PM.   Chief Complaint  Patient presents with  . Dizziness   The history is provided by the patient. No language interpreter was used.    HPI Comments: Marilyn Haas is a 75 y.o. female who presents to the Emergency Department complaining of gradual onset, constant, fatigue and dizziness for the past couple of days. Pt reports that her husband is in the hospital currently and that she has been up for days. She is staying with her husband in the hospital during the days and has been having difficulty sleeping at night. Pt also states that she is not eating very well. Family mentions that pt has hx of multiple hiatal hernias and has had issues with eating solid food for years. Pt is able to get nutrients through protein shakes. Pt has difficulty answering some questions and family reports that they have had difficulty understanding her since yesterday. Per family, pt seems disoriented. Family also mentions that pt was complaining of a cramping sensation in her left hand this morning. Denies vomiting, diarrhea, cough, shortness of breath, or any other associated symptoms.   Past Medical History  Diagnosis Date  . Dysphagia   . Hiatal hernia   . Hyperlipidemia   . Colonic polyp     adenomatous poly. No high grade dysplasia or invasive malignancy. 07/06/2006  . Depression   . Anxiety   . Impaired hearing     bilat   . Hypertension     pt states is currently not on medication   . Pneumonia     hx of   . Asthma     hx of in childhood   . GERD (gastroesophageal reflux disease)   . Arthritis   . Anemia     past hx of acute blood loss anemia   . Irritable bowel syndrome   . Scarlet fever     hx of in  childhood   . Esophageal stricture     hx of   . History of blood transfusion     following left hip fx/hemiarthroplasty    Past Surgical History  Procedure Laterality Date  . Hip fracture surgery      05/2009 left hip   . Esophagogastroduodenoscopy N/A 01/26/2014    Procedure: ESOPHAGOGASTRODUODENOSCOPY (EGD);  Surgeon: Rogene Houston, MD;  [facility];  Service: Endoscopy;  Laterality: N/A;  1030  . Maloney dilation N/A 01/26/2014    Procedure: Venia Minks DILATION;  Surgeon: Rogene Houston, MD;  [facility];  Service: Endoscopy;  Laterality: N/A;  . Colonscopy     . 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 Heart failure Mother   . Diabetes Mother   . Hypertension Mother   . Heart disease Sister     had MI at age85   Social History  Substance Use Topics  . Smoking status: Former Smoker -- 0.50 packs/day for 2 years    Types: Cigarettes    Quit date: 01/15/1961  . Smokeless tobacco: Never Used  .  Alcohol Use: 0.0 oz/week    0 Standard drinks or equivalent per week     Comment: ETOH abuse years ago, occ drinks now   OB History    No data available     Review of Systems  Constitutional: Positive for fatigue.  Respiratory: Negative for cough and shortness of breath.   Gastrointestinal: Negative for vomiting and diarrhea.  Neurological: Positive for dizziness.  All other systems reviewed and are negative.  Allergies  Avelox  Home Medications   Prior to Admission medications   Medication Sig Start Date End Date Taking? Authorizing Provider  ALPRAZolam Duanne Moron) 0.5 MG tablet Take 0.5 mg by mouth at bedtime as needed for anxiety.   Yes Historical Provider, MD  citalopram (CELEXA) 40 MG tablet Take 40 mg by mouth daily.   Yes Historical Provider, MD   pantoprazole (PROTONIX) 40 MG tablet TAKE ONE (1) TABLET EACH DAY 07/19/15  Yes Rogene Houston, MD  hyoscyamine (LEVSIN SL) 0.125 MG SL tablet Place 1 tablet (0.125 mg total) under the tongue every 6 (six) hours as needed. Patient not taking: Reported on 07/31/2015 01/15/14   Rogene Houston, MD  loperamide (IMODIUM A-D) 2 MG tablet Take 0.5 tablets (1 mg total) by mouth daily. Patient not taking: Reported on 07/31/2015 10/30/14   Rogene Houston, MD  meclizine (ANTIVERT) 25 MG tablet Take 1 tablet (25 mg total) by mouth 2 (two) times daily as needed for dizziness. Patient not taking: Reported on 10/03/2015 07/31/15   Francine Graven, DO  Simethicone 180 MG CAPS Take 1 capsule (180 mg total) by mouth 3 (three) times daily as needed. Patient not taking: Reported on 10/03/2015 10/30/14   Rogene Houston, MD  simvastatin (ZOCOR) 40 MG tablet TAKE ONE TABLET BY MOUTH EVERY NIGHT AT BEDTIME Patient not taking: Reported on 07/31/2015 02/26/14   Pixie Casino, MD   BP 112/62 mmHg  Pulse 70  Temp(Src) 97.9 F (36.6 C) (Oral)  Resp 12  Ht 5\' 5"  (1.651 m)  Wt 110 lb (49.896 kg)  BMI 18.31 kg/m2  SpO2 100%   Physical Exam  Constitutional: She is oriented to person, place, and time. She appears well-developed and well-nourished.  HENT:  Head: Normocephalic and atraumatic.  Dry oral mucus membranes  Eyes: Conjunctivae and EOM are normal. Pupils are equal, round, and reactive to light.  Neck: Normal range of motion and phonation normal. Neck supple.  Cardiovascular: Normal rate and regular rhythm.   Initial blood pressure was low.  Pulmonary/Chest: Effort normal and breath sounds normal. She has no wheezes. She has no rales. She exhibits no tenderness.  Abdominal: Soft. She exhibits no distension. There is no tenderness. There is no guarding.  Musculoskeletal: Normal range of motion.  Neurological: She is alert and oriented to person, place, and time. She exhibits normal muscle tone.  Skin: Skin is  warm and dry.  Psychiatric: She has a normal mood and affect. Her behavior is normal. Judgment and thought content normal.  Nursing note and vitals reviewed.   ED Course  Procedures (including critical care time)  DIAGNOSTIC STUDIES: Oxygen Saturation is 98% on RA, normal by my interpretation.    Medications  0.9 %  sodium chloride infusion (not administered)  sodium chloride 0.9 % bolus 500 mL (0 mLs Intravenous Stopped 10/03/15 1528)    Patient Vitals for the past 24 hrs:  BP Temp Temp src Pulse Resp SpO2 Height Weight  10/03/15 1500 112/62 mmHg - - - 12 - - -  10/03/15 1430 118/68 mmHg - - - 13 - - -  10/03/15 1400 102/76 mmHg - - - 14 - - -  10/03/15 1330 108/62 mmHg - - - 11 - - -  10/03/15 1300 110/85 mmHg - - 70 13 100 % - -  10/03/15 1230 111/70 mmHg - - 64 16 98 % - -  10/03/15 1212 - - - 68 20 99 % - -  10/03/15 1200 111/75 mmHg - - 76 (!) 29 98 % - -  10/03/15 1130 114/71 mmHg - - 67 11 98 % - -  10/03/15 1117 - - - 64 12 98 % - -  10/03/15 1116 110/64 mmHg - - 66 - 100 % - -  10/03/15 1115 - - - 69 - 97 % - -  10/03/15 1048 (!) 77/44 mmHg 97.9 F (36.6 C) Oral 78 16 99 % 5\' 5"  (1.651 m) 110 lb (49.896 kg)    COORDINATION OF CARE: 12:03 PM-Discussed treatment plan which includes IV Fluids with pt at bedside and pt agreed to plan.    3:27 PM Reevaluation with update and discussion. After initial assessment and treatment, an updated evaluation reveals Patient has been able to eat, drink and ambulate here. She also urinated, be able to walk to the bathroom without difficulty. Findings discussed with patient, and all questions answered. Vincy Feliz L   4:11 PM- findings discussed with family members, all questions answered   Labs Review Labs Reviewed  BASIC METABOLIC PANEL - Abnormal; Notable for the following:    Sodium 127 (*)    Potassium 3.4 (*)    Chloride 94 (*)    Glucose, Bld 171 (*)    Calcium 8.5 (*)    All other components within normal limits   CBC - Abnormal; Notable for the following:    RBC 3.37 (*)    Hemoglobin 10.5 (*)    HCT 31.0 (*)    All other components within normal limits  URINALYSIS, ROUTINE W REFLEX MICROSCOPIC (NOT AT [facility]) - Abnormal; Notable for the following:    Color, Urine STRAW (*)    Specific Gravity, Urine <1.005 (*)    All other components within normal limits  CBG MONITORING, ED - Abnormal; Notable for the following:    Glucose-Capillary 180 (*)    All other components within normal limits    Imaging Review No results found. I have personally reviewed and evaluated these images and lab results as part of my medical decision-making.   EKG Interpretation   Date/Time:  Thursday October 03 2015 11:00:19 EDT Ventricular Rate:  70 PR Interval:  214 QRS Duration: 84 QT Interval:  424 QTC Calculation: 457 R Axis:   24 Text Interpretation:  Sinus rhythm with 1st degree A-V block Otherwise  normal ECG Since last tracing PR interval is longer Confirmed by Eulis Foster   MD, Sabatino Williard [id number]) on 10/03/2015 11:11:39 AM      MDM   Final diagnoses:  Malaise and fatigue    Nonspecific symptoms, with psychosocial structures, and decreased oral intake leading to malaise. Patient improved in ED with blood pressure normalized after IV fluids, and able to tolerate oral nutrition and hydration.  Nursing Notes Reviewed/ Care Coordinated Applicable Imaging Reviewed Interpretation of Laboratory Data incorporated into ED treatment  The patient appears reasonably screened and/or stabilized for discharge and I doubt any other medical condition or other [facility] requiring further screening, evaluation, or treatment in the ED at this time prior to discharge.  Plan: Home  Medications- usual; Home Treatments- rest; return here if the recommended treatment, does not improve the symptoms; Recommended follow up- PCP 1 week   I personally performed the services described in this documentation, which was scribed in my presence. The  recorded information has been reviewed and is accurate.      Daleen Bo, MD 10/03/15 [phone]

## 2015-10-03 NOTE — ED Notes (Signed)
Per NT pt ambulated to BR with cane, pt did state that she is still light headed

## 2015-10-03 NOTE — Discharge Instructions (Signed)
Try to drink 1 L of water each day in addition to your usual fluid intake. Work hard on eating 3 meals each day. It does help to supplement your nutritional intake with a nutritional shake. See your doctor for checkup in one week.   Dehydration, Adult Dehydration is a condition in which you do not have enough fluid or water in your body. It happens when you take in less fluid than you lose. Vital organs such as the kidneys, brain, and heart cannot function without a proper amount of fluids. Any loss of fluids from the body can cause dehydration.  Dehydration can range from mild to severe. This condition should be treated right away to help prevent it from becoming severe. CAUSES  This condition may be caused by:  Vomiting.  Diarrhea.  Excessive sweating, such as when exercising in hot or humid weather.  Not drinking enough fluid during strenuous exercise or during an illness.  Excessive urine output.  Fever.  Certain medicines. RISK FACTORS This condition is more likely to develop in:  People who are taking certain medicines that cause the body to lose excess fluid (diuretics).   People who have a chronic illness, such as diabetes, that may increase urination.  Older adults.   People who live at high altitudes.   People who participate in endurance sports.  SYMPTOMS  Mild Dehydration  Thirst.  Dry lips.  Slightly dry mouth.  Dry, warm skin. Moderate Dehydration  Very dry mouth.   Muscle cramps.   Dark urine and decreased urine production.   Decreased tear production.   Headache.   Light-headedness, especially when you stand up from a sitting position.  Severe Dehydration  Changes in skin.   Cold and clammy skin.   Skin does not spring back quickly when lightly pinched and released.   Changes in body fluids.   Extreme thirst.   No tears.   Not able to sweat when body temperature is high, such as in hot weather.   Minimal  urine production.   Changes in vital signs.   Rapid, weak pulse (more than 100 beats per minute when you are sitting still).   Rapid breathing.   Low blood pressure.   Other changes.   Sunken eyes.   Cold hands and feet.   Confusion.  Lethargy and difficulty being awakened.  Fainting (syncope).   Short-term weight loss.   Unconsciousness. DIAGNOSIS  This condition may be diagnosed based on your symptoms. You may also have tests to determine how severe your dehydration is. These tests may include:   Urine tests.   Blood tests.  TREATMENT  Treatment for this condition depends on the severity. Mild or moderate dehydration can often be treated at home. Treatment should be started right away. Do not wait until dehydration becomes severe. Severe dehydration needs to be treated at the hospital. Treatment for Mild Dehydration  Drinking plenty of water to replace the fluid you have lost.   Replacing minerals in your blood (electrolytes) that you may have lost.  Treatment for Moderate Dehydration  Consuming oral rehydration solution (ORS). Treatment for Severe Dehydration  Receiving fluid through an IV tube.   Receiving electrolyte solution through a feeding tube that is passed through your nose and into your stomach (nasogastric tube or NG tube).  Correcting any abnormalities in electrolytes. HOME CARE INSTRUCTIONS   Drink enough fluid to keep your urine clear or pale yellow.   Drink water or fluid slowly by taking small sips. You  can also try sucking on ice cubes.  Have food or beverages that contain electrolytes. Examples include bananas and sports drinks.  Take over-the-counter and prescription medicines only as told by your health care provider.   Prepare ORS according to the manufacturer's instructions. Take sips of ORS every 5 minutes until your urine returns to normal.  If you have vomiting or diarrhea, continue to try to drink water, ORS,  or both.   If you have diarrhea, avoid:   Beverages that contain caffeine.   Fruit juice.   Milk.   Carbonated soft drinks.  Do not take salt tablets. This can lead to the condition of having too much sodium in your body (hypernatremia).  SEEK MEDICAL CARE IF:  You cannot eat or drink without vomiting.  You have had moderate diarrhea during a period of more than 24 hours.  You have a fever. SEEK IMMEDIATE MEDICAL CARE IF:   You have extreme thirst.  You have severe diarrhea.  You have not urinated in 6-8 hours, or you have urinated only a small amount of very dark urine.  You have shriveled skin.  You are dizzy, confused, or both.   This information is not intended to replace advice given to you by your health care provider. Make sure you discuss any questions you have with your health care provider.   Document Released: 03/02/2005 Document Revised: 11/21/2014 Document Reviewed: 07/18/2014 Elsevier Interactive Patient Education 2016 [employer].  Fatigue Fatigue is feeling tired all of the time, a lack of energy, or a lack of motivation. Occasional or mild fatigue is often a normal response to activity or life in general. However, long-lasting (chronic) or extreme fatigue may indicate an underlying medical condition. HOME CARE INSTRUCTIONS  Watch your fatigue for any changes. The following actions may help to lessen any discomfort you are feeling:  Talk to your health care provider about how much sleep you need each night. Try to get the required amount every night.  Take medicines only as directed by your health care provider.  Eat a healthy and nutritious diet. Ask your health care provider if you need help changing your diet.  Drink enough fluid to keep your urine clear or pale yellow.  Practice ways of relaxing, such as yoga, meditation, massage therapy, or acupuncture.  Exercise regularly.   Change situations that cause you stress. Try to keep your  work and personal routine reasonable.  Do not abuse illegal drugs.  Limit alcohol intake to no more than 1 drink per day for nonpregnant women and 2 drinks per day for men. One drink equals 12 ounces of beer, 5 ounces of wine, or 1 ounces of hard liquor.  Take a multivitamin, if directed by your health care provider. SEEK MEDICAL CARE IF:   Your fatigue does not get better.  You have a fever.   You have unintentional weight loss or gain.  You have headaches.   You have difficulty:   Falling asleep.  Sleeping throughout the night.  You feel angry, guilty, anxious, or sad.   You are unable to have a bowel movement (constipation).   You skin is dry.   Your legs or another part of your body is swollen.  SEEK IMMEDIATE MEDICAL CARE IF:   You feel confused.   Your vision is blurry.  You feel faint or pass out.   You have a severe headache.   You have severe abdominal, pelvic, or back pain.   You have chest pain,  shortness of breath, or an irregular or fast heartbeat.   You are unable to urinate or you urinate less than normal.   You develop abnormal bleeding, such as bleeding from the rectum, vagina, nose, lungs, or nipples.  You vomit blood.   You have thoughts about harming yourself or committing suicide.   You are worried that you might harm someone else.    This information is not intended to replace advice given to you by your health care provider. Make sure you discuss any questions you have with your health care provider.   Document Released: 12/28/2006 Document Revised: 03/23/2014 Document Reviewed: 07/04/2013 Elsevier Interactive Patient Education [employer].

## 2015-10-03 NOTE — ED Notes (Signed)
Pt's daughter in law states last night pt began c/o generalized weakness, dizziness, and fatigue.  They state her husband is in the hospital and she has been very stressed.  Pt will not answer questions at triage.  They state they just arrived in town last night and are unsure when this actually began.

## 2015-10-11 ENCOUNTER — Emergency Department (HOSPITAL_COMMUNITY): Payer: Medicare Other

## 2015-10-11 ENCOUNTER — Inpatient Hospital Stay (HOSPITAL_COMMUNITY)
Admission: EM | Admit: 2015-10-11 | Discharge: 2015-10-13 | DRG: 640 | Disposition: A | Discharge status: Home Health | Payer: Medicare Other | Attending: Internal Medicine | Admitting: Internal Medicine

## 2015-10-11 ENCOUNTER — Observation Stay (HOSPITAL_COMMUNITY): Payer: Medicare Other

## 2015-10-11 ENCOUNTER — Encounter (HOSPITAL_COMMUNITY): Payer: Self-pay

## 2015-10-11 DIAGNOSIS — M1990 Unspecified osteoarthritis, unspecified site: Secondary | ICD-10-CM | POA: Diagnosis present

## 2015-10-11 DIAGNOSIS — S098XXA Other specified injuries of head, initial encounter: Secondary | ICD-10-CM | POA: Diagnosis not present

## 2015-10-11 DIAGNOSIS — S0181XA Laceration without foreign body of other part of head, initial encounter: Secondary | ICD-10-CM | POA: Diagnosis not present

## 2015-10-11 DIAGNOSIS — S3993XA Unspecified injury of pelvis, initial encounter: Secondary | ICD-10-CM | POA: Diagnosis not present

## 2015-10-11 DIAGNOSIS — I10 Essential (primary) hypertension: Secondary | ICD-10-CM | POA: Diagnosis not present

## 2015-10-11 DIAGNOSIS — J45909 Unspecified asthma, uncomplicated: Secondary | ICD-10-CM | POA: Diagnosis not present

## 2015-10-11 DIAGNOSIS — E871 Hypo-osmolality and hyponatremia: Principal | ICD-10-CM

## 2015-10-11 DIAGNOSIS — H9190 Unspecified hearing loss, unspecified ear: Secondary | ICD-10-CM | POA: Diagnosis present

## 2015-10-11 DIAGNOSIS — Z87891 Personal history of nicotine dependence: Secondary | ICD-10-CM

## 2015-10-11 DIAGNOSIS — S0093XA Contusion of unspecified part of head, initial encounter: Secondary | ICD-10-CM | POA: Diagnosis not present

## 2015-10-11 DIAGNOSIS — E43 Unspecified severe protein-calorie malnutrition: Secondary | ICD-10-CM | POA: Diagnosis not present

## 2015-10-11 DIAGNOSIS — R42 Dizziness and giddiness: Secondary | ICD-10-CM | POA: Diagnosis not present

## 2015-10-11 DIAGNOSIS — Z833 Family history of diabetes mellitus: Secondary | ICD-10-CM | POA: Diagnosis not present

## 2015-10-11 DIAGNOSIS — R55 Syncope and collapse: Secondary | ICD-10-CM | POA: Diagnosis not present

## 2015-10-11 DIAGNOSIS — E860 Dehydration: Secondary | ICD-10-CM | POA: Diagnosis not present

## 2015-10-11 DIAGNOSIS — Z8249 Family history of ischemic heart disease and other diseases of the circulatory system: Secondary | ICD-10-CM

## 2015-10-11 DIAGNOSIS — R102 Pelvic and perineal pain: Secondary | ICD-10-CM | POA: Diagnosis not present

## 2015-10-11 DIAGNOSIS — Z681 Body mass index (BMI) 19 or less, adult: Secondary | ICD-10-CM

## 2015-10-11 DIAGNOSIS — Z96642 Presence of left artificial hip joint: Secondary | ICD-10-CM | POA: Diagnosis not present

## 2015-10-11 DIAGNOSIS — Y92009 Unspecified place in unspecified non-institutional (private) residence as the place of occurrence of the external cause: Secondary | ICD-10-CM

## 2015-10-11 DIAGNOSIS — W19XXXA Unspecified fall, initial encounter: Secondary | ICD-10-CM | POA: Diagnosis not present

## 2015-10-11 DIAGNOSIS — Z86010 Personal history of colonic polyps: Secondary | ICD-10-CM

## 2015-10-11 DIAGNOSIS — K219 Gastro-esophageal reflux disease without esophagitis: Secondary | ICD-10-CM | POA: Diagnosis not present

## 2015-10-11 DIAGNOSIS — F329 Major depressive disorder, single episode, unspecified: Secondary | ICD-10-CM | POA: Diagnosis not present

## 2015-10-11 DIAGNOSIS — S0101XA Laceration without foreign body of scalp, initial encounter: Secondary | ICD-10-CM | POA: Diagnosis present

## 2015-10-11 DIAGNOSIS — E785 Hyperlipidemia, unspecified: Secondary | ICD-10-CM | POA: Diagnosis not present

## 2015-10-11 DIAGNOSIS — S299XXA Unspecified injury of thorax, initial encounter: Secondary | ICD-10-CM | POA: Diagnosis not present

## 2015-10-11 LAB — CBC WITH DIFFERENTIAL/PLATELET
BASOS ABS: 0 10*3/uL (ref 0.0–0.1)
BASOS PCT: 1 %
Eosinophils Absolute: 0.1 10*3/uL (ref 0.0–0.7)
Eosinophils Relative: 2 %
HEMATOCRIT: 30.2 % — AB (ref 36.0–46.0)
Hemoglobin: 10.6 g/dL — ABNORMAL LOW (ref 12.0–15.0)
Lymphocytes Relative: 23 %
Lymphs Abs: 1.5 10*3/uL (ref 0.7–4.0)
MCH: 32 pg (ref 26.0–34.0)
MCHC: 35.1 g/dL (ref 30.0–36.0)
MCV: 91.2 fL (ref 78.0–100.0)
MONO ABS: 0.6 10*3/uL (ref 0.1–1.0)
Monocytes Relative: 10 %
NEUTROS ABS: 4 10*3/uL (ref 1.7–7.7)
NEUTROS PCT: 64 %
PLATELETS: 248 10*3/uL (ref 150–400)
RBC: 3.31 MIL/uL — ABNORMAL LOW (ref 3.87–5.11)
RDW: 12.3 % (ref 11.5–15.5)
WBC: 6.3 10*3/uL (ref 4.0–10.5)

## 2015-10-11 LAB — URINE MICROSCOPIC-ADD ON
BACTERIA UA: NONE SEEN
Squamous Epithelial / LPF: NONE SEEN
WBC, UA: NONE SEEN WBC/hpf (ref 0–5)

## 2015-10-11 LAB — TROPONIN I

## 2015-10-11 LAB — BASIC METABOLIC PANEL
ANION GAP: 3 — AB (ref 5–15)
BUN: 13 mg/dL (ref 6–20)
CALCIUM: 8.5 mg/dL — AB (ref 8.9–10.3)
CO2: 28 mmol/L (ref 22–32)
Chloride: 98 mmol/L — ABNORMAL LOW (ref 101–111)
Creatinine, Ser: 0.58 mg/dL (ref 0.44–1.00)
Glucose, Bld: 109 mg/dL — ABNORMAL HIGH (ref 65–99)
Potassium: 4.5 mmol/L (ref 3.5–5.1)
SODIUM: 129 mmol/L — AB (ref 135–145)

## 2015-10-11 LAB — URINALYSIS, ROUTINE W REFLEX MICROSCOPIC
BILIRUBIN URINE: NEGATIVE
Glucose, UA: 100 mg/dL — AB
KETONES UR: NEGATIVE mg/dL
Leukocytes, UA: NEGATIVE
NITRITE: NEGATIVE
PROTEIN: NEGATIVE mg/dL
Specific Gravity, Urine: <1.005 — ABNORMAL LOW (ref 1.005–1.030)
pH: 6.5 (ref 5.0–8.0)

## 2015-10-11 LAB — PROTIME-INR
INR: 1.03
PROTHROMBIN TIME: 13.5 s (ref 11.4–15.2)

## 2015-10-11 LAB — POC OCCULT BLOOD, ED: Fecal Occult Bld: NEGATIVE

## 2015-10-11 MED ORDER — SODIUM CHLORIDE 0.9 % IV SOLN: INTRAVENOUS | Status: DC

## 2015-10-11 MED ORDER — TETANUS-DIPHTH-ACELL PERTUSSIS 5-2.5-18.5 LF-MCG/0.5 IM SUSP
0.5000 mL | Freq: Once | INTRAMUSCULAR | Status: AC
  Administered 2015-10-11: 0.5 mL via INTRAMUSCULAR
  Filled 2015-10-11: qty 0.5

## 2015-10-11 MED ORDER — ENOXAPARIN SODIUM 40 MG/0.4ML ~~LOC~~ SOLN
40.0000 mg | SUBCUTANEOUS | Status: DC
  Administered 2015-10-11 – 2015-10-12 (×2): 40 mg via SUBCUTANEOUS
  Filled 2015-10-11 (×2): qty 0.4

## 2015-10-11 MED ORDER — LIDOCAINE-EPINEPHRINE (PF) 1 %-1:200000 IJ SOLN
30.0000 mL | Freq: Once | INTRAMUSCULAR | Status: DC
  Filled 2015-10-11: qty 30

## 2015-10-11 MED ORDER — SODIUM CHLORIDE 0.9% FLUSH
3.0000 mL | Freq: Two times a day (BID) | INTRAVENOUS | Status: DC
  Administered 2015-10-12: 3 mL via INTRAVENOUS

## 2015-10-11 MED ORDER — SENNOSIDES-DOCUSATE SODIUM 8.6-50 MG PO TABS
1.0000 | ORAL_TABLET | Freq: Every evening | ORAL | Status: DC | PRN
  Administered 2015-10-12 – 2015-10-13 (×2): 1 via ORAL
  Filled 2015-10-11 (×2): qty 1

## 2015-10-11 MED ORDER — ACETAMINOPHEN 325 MG PO TABS
650.0000 mg | ORAL_TABLET | Freq: Four times a day (QID) | ORAL | Status: DC | PRN
  Administered 2015-10-12: 650 mg via ORAL
  Filled 2015-10-11 (×2): qty 2

## 2015-10-11 MED ORDER — CITALOPRAM HYDROBROMIDE 20 MG PO TABS
40.0000 mg | ORAL_TABLET | Freq: Every day | ORAL | Status: DC
  Administered 2015-10-11 – 2015-10-13 (×3): 40 mg via ORAL
  Filled 2015-10-11 (×3): qty 2

## 2015-10-11 MED ORDER — LIDOCAINE HCL (PF) 1 % IJ SOLN
5.0000 mL | Freq: Once | INTRAMUSCULAR | Status: AC
  Administered 2015-10-11: 5 mL via INTRADERMAL

## 2015-10-11 MED ORDER — ENSURE ENLIVE PO LIQD
237.0000 mL | Freq: Two times a day (BID) | ORAL | Status: DC
  Administered 2015-10-12 – 2015-10-13 (×3): 237 mL via ORAL

## 2015-10-11 MED ORDER — ONDANSETRON HCL 4 MG PO TABS: 4.0000 mg | ORAL_TABLET | Freq: Four times a day (QID) | ORAL | Status: DC | PRN

## 2015-10-11 MED ORDER — POVIDONE-IODINE 10 % EX SOLN
CUTANEOUS | Status: AC
  Filled 2015-10-11: qty 118

## 2015-10-11 MED ORDER — ACETAMINOPHEN 650 MG RE SUPP: 650.0000 mg | Freq: Four times a day (QID) | RECTAL | Status: DC | PRN

## 2015-10-11 MED ORDER — CETYLPYRIDINIUM CHLORIDE 0.05 % MT LIQD
7.0000 mL | Freq: Two times a day (BID) | OROMUCOSAL | Status: DC
  Administered 2015-10-11 – 2015-10-13 (×4): 7 mL via OROMUCOSAL

## 2015-10-11 MED ORDER — PANTOPRAZOLE SODIUM 40 MG PO TBEC
40.0000 mg | DELAYED_RELEASE_TABLET | Freq: Every day | ORAL | Status: DC
  Administered 2015-10-12 – 2015-10-13 (×2): 40 mg via ORAL
  Filled 2015-10-11 (×2): qty 1

## 2015-10-11 MED ORDER — SODIUM CHLORIDE 0.9 % IV BOLUS (SEPSIS)
1000.0000 mL | Freq: Once | INTRAVENOUS | Status: AC
  Administered 2015-10-11: 1000 mL via INTRAVENOUS

## 2015-10-11 MED ORDER — SODIUM CHLORIDE 0.9 % IV SOLN
Freq: Once | INTRAVENOUS | Status: AC
  Administered 2015-10-11: 13:00:00 via INTRAVENOUS

## 2015-10-11 MED ORDER — ONDANSETRON HCL 4 MG/2ML IJ SOLN
4.0000 mg | Freq: Four times a day (QID) | INTRAMUSCULAR | Status: DC | PRN
  Administered 2015-10-12 (×2): 4 mg via INTRAVENOUS
  Filled 2015-10-11 (×2): qty 2

## 2015-10-11 MED ORDER — ALPRAZOLAM 0.5 MG PO TABS
0.5000 mg | ORAL_TABLET | Freq: Every evening | ORAL | Status: DC | PRN
  Administered 2015-10-11 – 2015-10-12 (×2): 0.5 mg via ORAL
  Filled 2015-10-11 (×2): qty 1

## 2015-10-11 NOTE — H&P (Signed)
History and Physical    Marilyn Haas [id number] DOB: 06-23-1940 DOA: 10/11/2015  Referring MD/NP/PA: Charolotte Capuchin, EDP PCP: Purvis Kilts, MD  Patient coming from: Home  Chief Complaint: Loss of consciousness  HPI: Marilyn Haas is a 75 y.o. female with history of depression, esophageal stricture status post multiple dilatations in the past who presents to the hospital today with a syncopal event. She states that today she was walking down the hallway next thing she remembers is her husband yelling at her and she was on the floor. she suffered a laceration to the occipital area of her skull. She did not have any premonitory signs or symptoms. Of note she has been very dizzy and weak for the past week and in fact sought attention in the emergency department for this issue and was discharged home about 4 days ago. She states that she has been really tired, fatigued and has not been eating well over the past 10 days as her husband has been in the hospital and she has been dedicated to taking care of him. In the emergency department she was noted to be hypotensive with systolic blood pressure in the 90s, they were not able to do a full set of orthostatics as she was unable to stand due to profound weakness. CT scan of the head was negative for acute intracranial abnormalities, although it does make mention of an area of hypoattenuation in the left anterior cerebellum that is felt to be artifactual. Admission has been requested for further evaluation and management.  Past Medical/Surgical History: Past Medical History:  Diagnosis Date  . Anemia    past hx of acute blood loss anemia   . Anxiety   . Arthritis   . Asthma    hx of in childhood   . Colonic polyp    adenomatous poly. No high grade dysplasia or invasive malignancy. 07/06/2006  . Depression   . Dysphagia   . Esophageal stricture    hx of   . GERD (gastroesophageal reflux disease)   . Hiatal hernia   . History of  blood transfusion    following left hip fx/hemiarthroplasty   . Hyperlipidemia   . Hypertension    pt states is currently not on medication - pt says has low bp  . Impaired hearing    bilat   . Irritable bowel syndrome   . Pneumonia    hx of   . Scarlet fever    hx of in childhood     Past Surgical History:  Procedure Laterality Date  . colonscopy     . 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 ESOPHAGOGASTRODUODENOSCOPY N/A 01/26/2014   Procedure: ESOPHAGOGASTRODUODENOSCOPY (EGD);  Surgeon: Rogene Houston, MD;  Location: [facility];  Service: Endoscopy;  Laterality: N/A;  1030  . HIP FRACTURE SURGERY     05/2009 left hip   . MALONEY DILATION N/A 01/26/2014   Procedure: Venia Minks DILATION;  Surgeon: Rogene Houston, MD;  Location: [facility];  Service: Endoscopy;  Laterality: N/A;    Social History:  reports that she quit smoking about 54 years ago. Her smoking use included Cigarettes. She has a 1.00 pack-year smoking history. She has never used smokeless tobacco. She reports that she drinks alcohol. She reports that she does not use drugs.  Allergies: Allergies  Allergen Reactions  .  Avelox [Moxifloxacin Hcl In Nacl] Other (See Comments)    "go crazy"    Family History:  Family History  Problem Relation Age of Onset  . Heart failure Mother   . Diabetes Mother   . Hypertension Mother   . Heart disease Sister     had MI at age85    Prior to Admission medications   Medication Sig Start Date End Date Taking? Authorizing Provider  citalopram (CELEXA) 40 MG tablet Take 40 mg by mouth daily.   Yes Historical Provider, MD  HYDROcodone-acetaminophen (NORCO) 10-325 MG tablet Take 1 tablet by mouth every 6 (six) hours as needed. 08/26/15  Yes Historical Provider, MD  pantoprazole (PROTONIX) 40 MG tablet TAKE ONE (1) TABLET EACH  DAY 07/19/15  Yes Rogene Houston, MD  ALPRAZolam (XANAX) 0.5 MG tablet Take 0.5 mg by mouth at bedtime as needed for anxiety.    Historical Provider, MD  hyoscyamine (LEVSIN SL) 0.125 MG SL tablet Place 1 tablet (0.125 mg total) under the tongue every 6 (six) hours as needed. Patient not taking: Reported on 07/31/2015 01/15/14   Rogene Houston, MD  meclizine (ANTIVERT) 25 MG tablet Take 1 tablet (25 mg total) by mouth 2 (two) times daily as needed for dizziness. Patient not taking: Reported on 10/03/2015 07/31/15   Francine Graven, DO  Simethicone 180 MG CAPS Take 1 capsule (180 mg total) by mouth 3 (three) times daily as needed. Patient not taking: Reported on 10/03/2015 10/30/14   Rogene Houston, MD  simvastatin (ZOCOR) 40 MG tablet TAKE ONE TABLET BY MOUTH EVERY NIGHT AT BEDTIME Patient not taking: Reported on 07/31/2015 02/26/14   Pixie Casino, MD    Review of Systems:  Constitutional: Denies fever, chills, diaphoresis, appetite change. HEENT: Denies photophobia, eye pain, redness, hearing loss, ear pain, congestion, sore throat, rhinorrhea, sneezing, mouth sores, trouble swallowing, neck pain, neck stiffness and tinnitus.   Respiratory: Denies SOB, DOE, cough, chest tightness,  and wheezing.   Cardiovascular: Denies chest pain, palpitations and leg swelling.  Gastrointestinal: Denies nausea, vomiting, abdominal pain, diarrhea, constipation, blood in stool and abdominal distention.  Genitourinary: Denies dysuria, urgency, frequency, hematuria, flank pain and difficulty urinating.  Endocrine: Denies: hot or cold intolerance, sweats, changes in hair or nails, polyuria, polydipsia. Musculoskeletal: Denies myalgias, back pain, joint swelling, arthralgias and gait problem.  Skin: Denies pallor, rash and wound.  Neurological: Denies , seizures, syncope,  numbness and headaches.  Hematological: Denies adenopathy. Easy bruising, personal or family bleeding history  Psychiatric/Behavioral: Denies  suicidal ideation, mood changes, confusion, nervousness, sleep disturbance and agitation    Physical Exam: Vitals:   10/11/15 1300 10/11/15 1330 10/11/15 1400 10/11/15 1430  BP: 105/69 94/56 (!) 90/53 94/55  Pulse: 62 (!) 56 (!) 53 (!) 55  Resp: (!) 6 14 14 12   Temp:      TempSrc:      SpO2: 94% 97% 98% 98%  Weight:      Height:         Constitutional: NAD, calm, comfortable Eyes: PERRL, lids and conjunctivae normal ENMT: Mucous membranes are moist. Posterior pharynx clear of any exudate or lesions.Normal dentition. Laceration with significant bleeding to the posterior aspect of the skull which has been repaired with 3 staples. Neck: normal, supple, no masses, no thyromegaly Respiratory: clear to auscultation bilaterally, no wheezing, no crackles. Normal respiratory effort. No accessory muscle use.  Cardiovascular: Regular rate and rhythm, no murmurs / rubs / gallops. No extremity edema. 2+ pedal pulses.  No carotid bruits.  Abdomen: no tenderness, no masses palpated. No hepatosplenomegaly. Bowel sounds positive.  Musculoskeletal: no clubbing / cyanosis. No joint deformity upper and lower extremities. Good ROM, no contractures. Normal muscle tone.  Skin: no rashes, lesions, ulcers. No induration Neurologic: CN 2-12 grossly intact. Sensation intact, DTR normal. Strength 5/5 in all 4.  Psychiatric: Normal judgment and insight. Alert and oriented x 3. Normal mood.    Labs on Admission: I have personally reviewed the following labs and imaging studies  CBC:  Recent Labs Lab 10/11/15 0910  WBC 6.3  NEUTROABS 4.0  HGB 10.6*  HCT 30.2*  MCV 91.2  PLT [id number]   Basic Metabolic Panel:  Recent Labs Lab 10/11/15 0910  NA 129*  K 4.5  CL 98*  CO2 28  GLUCOSE 109*  BUN 13  CREATININE 0.58  CALCIUM 8.5*   GFR: Estimated Creatinine Clearance: 49.3 mL/min (by C-G formula based on SCr of 0.8 mg/dL). Liver Function Tests: No results for input(s): AST, ALT, ALKPHOS, BILITOT,  PROT, ALBUMIN in the last 168 hours. No results for input(s): LIPASE, AMYLASE in the last 168 hours. No results for input(s): AMMONIA in the last 168 hours. Coagulation Profile:  Recent Labs Lab 10/11/15 0910  INR 1.03   Cardiac Enzymes:  Recent Labs Lab 10/11/15 0910  TROPONINI <0.03   BNP (last 3 results) No results for input(s): PROBNP in the last 8760 hours. HbA1C: No results for input(s): HGBA1C in the last 72 hours. CBG: No results for input(s): GLUCAP in the last 168 hours. Lipid Profile: No results for input(s): CHOL, HDL, LDLCALC, TRIG, CHOLHDL, LDLDIRECT in the last 72 hours. Thyroid Function Tests: No results for input(s): TSH, T4TOTAL, FREET4, T3FREE, THYROIDAB in the last 72 hours. Anemia Panel: No results for input(s): VITAMINB12, FOLATE, FERRITIN, TIBC, IRON, RETICCTPCT in the last 72 hours. Urine analysis:    Component Value Date/Time   COLORURINE YELLOW 10/11/2015 [place] 10/11/2015 1211   LABSPEC <1.005 (L) 10/11/2015 1211   PHURINE 6.5 10/11/2015 1211   GLUCOSEU 100 (A) 10/11/2015 1211   HGBUR TRACE (A) 10/11/2015 1211   BILIRUBINUR NEGATIVE 10/11/2015 1211   KETONESUR NEGATIVE 10/11/2015 1211   PROTEINUR NEGATIVE 10/11/2015 1211   UROBILINOGEN 1.0 06/26/2014 1400   NITRITE NEGATIVE 10/11/2015 1211   LEUKOCYTESUR NEGATIVE 10/11/2015 1211   Sepsis Labs: @LABRCNTIP (procalcitonin:4,lacticidven:4) )No results found for this or any previous visit (from the past 240 hour(s)).   Radiological Exams on Admission: Dg Chest 1 View  Result Date: 10/11/2015 CLINICAL DATA:  Fall. EXAM: CHEST 1 VIEW COMPARISON:  Radiographs of Jul 31, 2015. FINDINGS: The heart size and mediastinal contours are within normal limits. Both lungs are clear. No pneumothorax or pleural effusion is noted. The visualized skeletal structures are unremarkable. IMPRESSION: No acute cardiopulmonary abnormality seen. Electronically Signed   By: Marijo Conception, M.D.   On:  10/11/2015 10:53  Dg Pelvis 1-2 Views  Result Date: 10/11/2015 CLINICAL DATA:  Pain following fall EXAM: PELVIS - 1-2 VIEW COMPARISON:  July 04, 2014 FINDINGS: Patient is status post total hip replacement on the left. The left hip prosthesis shows lucency along its medial aspect on the femoral surface. Question a degree of loosening in this area. No acute fracture or dislocation. There is slight narrowing of the right hip joint, stable. No erosive change IMPRESSION: Question a degree of loosening along the medial aspect of the proximal femoral portion of a total hip prosthesis on the left. No acute fracture  or dislocation. Mild narrowing right hip joint. Electronically Signed   By: Lowella Grip III M.D.   On: 10/11/2015 10:54  Ct Head Wo Contrast  Result Date: 10/11/2015 CLINICAL DATA:  Syncopal episode this morning.  Dizziness. EXAM: CT HEAD WITHOUT CONTRAST CT CERVICAL SPINE WITHOUT CONTRAST TECHNIQUE: Multidetector CT imaging of the head and cervical spine was performed following the standard protocol without intravenous contrast. Multiplanar CT image reconstructions of the cervical spine were also generated. COMPARISON:  06/09/2008, head CT. FINDINGS: CT HEAD FINDINGS No mass effect or midline shift. No evidence of acute intracranial hemorrhage, or infarction. No abnormal extra-axial fluid collections. Gray-white matter differentiation is normal. Wedge-shaped area of hypoattenuation in the left cerebellum is felt to represent a beam hardening artifact rather than actual abnormality. Basal cisterns are preserved. No depressed skull fractures. Visualized paranasal sinuses and mastoid air cells are not opacified. CT CERVICAL SPINE FINDINGS There is normal alignment of the cervical spine. There is no evidence for acute fracture or dislocation. Mild to moderate multilevel osteoarthritic changes of the cervical spine are noted worse at C5-C6 and C6-C7. Prevertebral soft tissues have a normal appearance.  Lung apices have a normal appearance. IMPRESSION: No acute intracranial abnormality.Atrophy, chronic microvascular disease. Area of hypoattenuation in the left anterior cerebellum is felt to be artifactual. If patient however displaced symptoms of cerebellar deficiency, MRI of the brain may be considered. No evidence of acute abnormality within the cervical spine. 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 Electronically Signed   By: Fidela Salisbury M.D.   On: 10/11/2015 10:34  Ct Cervical Spine Wo Contrast  Result Date: 10/11/2015 CLINICAL DATA:  Syncopal episode this morning.  Dizziness. EXAM: CT HEAD WITHOUT CONTRAST CT CERVICAL SPINE WITHOUT CONTRAST TECHNIQUE: Multidetector CT imaging of the head and cervical spine was performed following the standard protocol without intravenous contrast. Multiplanar CT image reconstructions of the cervical spine were also generated. COMPARISON:  06/09/2008, head CT. FINDINGS: CT HEAD FINDINGS No mass effect or midline shift. No evidence of acute intracranial hemorrhage, or infarction. No abnormal extra-axial fluid collections. Gray-white matter differentiation is normal. Wedge-shaped area of hypoattenuation in the left cerebellum is felt to represent a beam hardening artifact rather than actual abnormality. Basal cisterns are preserved. No depressed skull fractures. Visualized paranasal sinuses and mastoid air cells are not opacified. CT CERVICAL SPINE FINDINGS There is normal alignment of the cervical spine. There is no evidence for acute fracture or dislocation. Mild to moderate multilevel osteoarthritic changes of the cervical spine are noted worse at C5-C6 and C6-C7. Prevertebral soft tissues have a normal appearance. Lung apices have a normal appearance. IMPRESSION: No acute intracranial abnormality.Atrophy, chronic microvascular disease. Area of hypoattenuation in the left anterior cerebellum is felt to be artifactual. If patient  however displaced symptoms of cerebellar deficiency, MRI of the brain may be considered. No evidence of acute abnormality within the cervical spine. 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 Electronically Signed   By: Fidela Salisbury M.D.   On: 10/11/2015 10:34   EKG: Independently reviewed. EKG shows normal sinus rhythm at a rate of 68, normal axis and no acute ST T-wave changes.  Assessment/Plan Principal Problem:   Syncope Active Problems:   Hyperlipidemia   Hypertension    Syncope -Initial workup in the emergency department is essentially negative. -This is most likely is due to extreme exhaustion and dehydration given lack of oral intake and proper rest over the past week to 10 days  while taking care of her husband. -Although given her extreme dizziness and findings on CT scan that may be artifactual, I believe we need to rule out a posterior circulation CVA which may indeed be causing some of her symptoms and have led to her syncope. MRI brain will be requested. -Monitor heart rhythm on telemetry, last echocardiogram in September 2016 was without abnormalities and most significantly without aortic valve stenosis, do not believe repeat echo is necessary at this point.  Hypertension -Well-controlled, continue home medications  Depression -Continue home medications   DVT prophylaxis: Lovenox  Code Status: Full code  Family Communication: Patient only  Disposition Plan: Likely home in 24 hours  Consults called: None  Admission status: Observation    Time Spent: 75 minutes  Lelon Frohlich MD [facility] Pager [phone]  If 7PM-7AM, please contact night-coverage [email or web address] Password [facility]  10/11/2015, 4:29 PM

## 2015-10-11 NOTE — Progress Notes (Signed)
Patient arrived on unit to room 334.  Telemetry box 3 applied.  Patient alert and oriented.  Dr. Jerilee Hoh notified via text page.

## 2015-10-11 NOTE — ED Triage Notes (Signed)
Pt lives with husband and ems reports pt was up all night with her husband.  This morning pt had a syncopal episode and fell hitting the back of her head on floor.  Reports her husband said pt has been "blacking out" a lot recently.  Pt c/o dizziness.  EMS reports they noticed some intermittent confusion.  Pt presently alert, oriented, and answering questions appropriately at this time.  Pt c/o pain in head from the fall.

## 2015-10-11 NOTE — ED Provider Notes (Signed)
LACERATION REPAIR Performed by: Roxanna Mew Authorized by: Roxanna Mew Consent: Verbal consent obtained. Risks and benefits: risks, benefits and alternatives were discussed Consent given by: patient Patient identity confirmed: provided demographic data Prepped and Draped in normal sterile fashion Wound explored  Laceration Location: right posterior scalp  Laceration Length: 3cm  No Foreign Bodies seen or palpated. Bottom of laceration viewed in a bloodless field.   Anesthesia: local infiltration  Local anesthetic: lidocaine 1%  Anesthetic total: 5 ml  Irrigation method: syringe Amount of cleaning: standard  Skin closure:  simple  Number of sutures: 4  Technique: staples  Patient tolerance: Patient tolerated the procedure well with no immediate complications.   Roxanna Mew, [place] 10/11/15 [place], MD 10/11/15 [phone]

## 2015-10-11 NOTE — ED Notes (Signed)
MD at bedside. 

## 2015-10-11 NOTE — ED Provider Notes (Signed)
[facility] DEPT Provider Note   CSN: [id number] Arrival date & time: 10/11/15  [id number]  First Provider Contact:  8:49 AM    By signing my name below, I, Marilyn Haas, attest that this documentation has been prepared under the direction and in the presence of Ezequiel Essex, MD. Electronically Signed: Rayna Haas, ED Scribe. 10/11/15. 9:29 AM.   History   Chief Complaint Chief Complaint  Patient presents with  . Loss of Consciousness    HPI HPI Comments: Marilyn Haas is a 75 y.o. female with a PMHx of acute blood loss anemia who presents to the Emergency Department by ambulance complaining of a syncopal episode that occurred PTA. Pt states that she lost consciousness while ambulating in her home this morning and fell striking the wall with her head. She states she felt dizzy prior to her syncopal episode and confirms her fall was witnessed by her husband. Pt reports she has been experiencing intermittent dizziness for the past week. She states she is drinking normally but endorses a decreased appetite and a mild laceration with controlled bleeding to her posterior scalp where she struck the wall. Pt reports a hx of chronic back pain but denies it has acutely worsened since her fall. Pt additionally reports mild slurred speech for the past week noting she was evaluated in the ED 1 week ago and her speech was slurred at the time. She denies any recent changes in her rx medications and is not on anticoagulants.  Pt is unsure of the timing of her most recent tetanus vaccination. She denies n/v/d, CP, SOB, abd pain, neck pain and recent bloody stools.   The history is provided by the patient. No language interpreter was used.    Past Medical History:  Diagnosis Date  . Anemia    past hx of acute blood loss anemia   . Anxiety   . Arthritis   . Asthma    hx of in childhood   . Colonic polyp    adenomatous poly. No high grade dysplasia or invasive malignancy. 07/06/2006  .  Depression   . Dysphagia   . Esophageal stricture    hx of   . GERD (gastroesophageal reflux disease)   . Hiatal hernia   . History of blood transfusion    following left hip fx/hemiarthroplasty   . Hyperlipidemia   . Hypertension    pt states is currently not on medication - pt says has low bp  . Impaired hearing    bilat   . Irritable bowel syndrome   . Pneumonia    hx of   . Scarlet fever    hx of in childhood     Patient Active Problem List   Diagnosis Date Noted  . Syncope 10/11/2015  . Acute pancreatitis 10/03/2015  . Other fatigue 12/16/2014  . PVC's (premature ventricular contractions) 12/16/2014  . Pain with hip hemiarthroplasty ([facility]) 07/04/2014  . Left hip pain 01/31/2013  . Anemia 08/09/2012  . B12 deficiency anemia 08/09/2012  . Vitamin D deficiency 08/09/2012  . Anxiety 08/09/2012  . Palpitations 08/09/2012  . Hyperlipidemia 05/25/2011  . Hypertension 05/25/2011  . Dysphagia 05/25/2011    Past Surgical History:  Procedure Laterality Date  . colonscopy     . 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 ESOPHAGOGASTRODUODENOSCOPY N/A 01/26/2014   Procedure: ESOPHAGOGASTRODUODENOSCOPY (EGD);  Surgeon:  Rogene Houston, MD;  Location: [facility];  Service: Endoscopy;  Laterality: N/A;  1030  . HIP FRACTURE SURGERY     05/2009 left hip   . MALONEY DILATION N/A 01/26/2014   Procedure: Venia Minks DILATION;  Surgeon: Rogene Houston, MD;  Location: [facility];  Service: Endoscopy;  Laterality: N/A;    OB History    No data available       Home Medications    Prior to Admission medications   Medication Sig Start Date End Date Taking? Authorizing Provider  citalopram (CELEXA) 40 MG tablet Take 40 mg by mouth daily.   Yes Historical Provider, MD  HYDROcodone-acetaminophen (NORCO) 10-325 MG tablet Take 1 tablet  by mouth every 6 (six) hours as needed. 08/26/15  Yes Historical Provider, MD  pantoprazole (PROTONIX) 40 MG tablet TAKE ONE (1) TABLET EACH DAY 07/19/15  Yes Rogene Houston, MD  ALPRAZolam (XANAX) 0.5 MG tablet Take 0.5 mg by mouth at bedtime as needed for anxiety.    Historical Provider, MD  hyoscyamine (LEVSIN SL) 0.125 MG SL tablet Place 1 tablet (0.125 mg total) under the tongue every 6 (six) hours as needed. Patient not taking: Reported on 07/31/2015 01/15/14   Rogene Houston, MD  meclizine (ANTIVERT) 25 MG tablet Take 1 tablet (25 mg total) by mouth 2 (two) times daily as needed for dizziness. Patient not taking: Reported on 10/03/2015 07/31/15   Francine Graven, DO  Simethicone 180 MG CAPS Take 1 capsule (180 mg total) by mouth 3 (three) times daily as needed. Patient not taking: Reported on 10/03/2015 10/30/14   Rogene Houston, MD  simvastatin (ZOCOR) 40 MG tablet TAKE ONE TABLET BY MOUTH EVERY NIGHT AT BEDTIME Patient not taking: Reported on 07/31/2015 02/26/14   Pixie Casino, MD    Family History Family History  Problem Relation Age of Onset  . Heart failure Mother   . Diabetes Mother   . Hypertension Mother   . Heart disease Sister     had MI at age78    Social History Social History  Substance Use Topics  . Smoking status: Former Smoker    Packs/day: 0.50    Years: 2.00    Types: Cigarettes    Quit date: 01/15/1961  . Smokeless tobacco: Never Used  . Alcohol use 0.0 oz/week     Comment: ETOH abuse years ago, occ drinks now     Allergies   Avelox [moxifloxacin hcl in nacl]   Review of Systems Review of Systems  Constitutional: Positive for appetite change.  Respiratory: Negative for shortness of breath.   Cardiovascular: Negative for chest pain.  Gastrointestinal: Negative for abdominal pain, anal bleeding, blood in stool, diarrhea, nausea and vomiting.  Musculoskeletal: Positive for back pain.  Skin: Positive for wound.  Neurological: Positive for dizziness,  syncope, speech difficulty and headaches.  All other systems reviewed and are negative.  Physical Exam Updated Vital Signs BP (!) 105/57 (BP Location: Right Arm) Comment (BP Location): small adult cuff  Pulse 66   Temp 98.1 F (36.7 C) (Oral)   Resp 18   Ht 5\' 5"  (1.651 m)   Wt 116 lb 14.4 oz (53 kg)   SpO2 100%   BMI 19.45 kg/m   Physical Exam  Constitutional: She is oriented to person, place, and time. She appears well-developed and well-nourished. No distress.  Pale appearing  HENT:  Head: Normocephalic.  Mouth/Throat: Oropharynx is clear and moist. No oropharyngeal exudate.  3 cm vertical  laceration to the occipital scalp; bleeding controlled  Eyes: EOM are normal. Pupils are equal, round, and reactive to light.  Pale conjunctiva   Neck: Normal range of motion. Neck supple.  No meningismus. No cervical spine tenderness.   Cardiovascular: Normal rate, regular rhythm, normal heart sounds and intact distal pulses.   No murmur heard. Pulmonary/Chest: Effort normal and breath sounds normal. No respiratory distress.  Abdominal: Soft. There is no tenderness. There is no rebound and no guarding.  Musculoskeletal: Normal range of motion. She exhibits no edema or tenderness.  Neurological: She is alert and oriented to person, place, and time. No cranial nerve deficit. She exhibits normal muscle tone. Coordination normal.   5/5 strength throughout. CN 2-12 intact.Equal grip strength. Slurred speech.   Skin: Skin is warm.  Psychiatric: She has a normal mood and affect. Her behavior is normal.  Nursing note and vitals reviewed.  ED Treatments / Results  Labs (all labs ordered are listed, but only abnormal results are displayed) Labs Reviewed  CBC WITH DIFFERENTIAL/PLATELET - Abnormal; Notable for the following:       Result Value   RBC 3.31 (*)    Hemoglobin 10.6 (*)    HCT 30.2 (*)    All other components within normal limits  BASIC METABOLIC PANEL - Abnormal; Notable for the  following:    Sodium 129 (*)    Chloride 98 (*)    Glucose, Bld 109 (*)    Calcium 8.5 (*)    Anion gap 3 (*)    All other components within normal limits  URINALYSIS, ROUTINE W REFLEX MICROSCOPIC (NOT AT [facility]) - Abnormal; Notable for the following:    Specific Gravity, Urine <1.005 (*)    Glucose, UA 100 (*)    Hgb urine dipstick TRACE (*)    All other components within normal limits  TROPONIN I  PROTIME-INR  URINE MICROSCOPIC-ADD ON  BASIC METABOLIC PANEL  CBC  POC OCCULT BLOOD, ED    EKG  EKG Interpretation  Date/Time:  Friday October 11 2015 08:52:53 EDT Ventricular Rate:  68 PR Interval:    QRS Duration: 92 QT Interval:  428 QTC Calculation: 456 R Axis:   11 Text Interpretation:  Sinus rhythm No significant change was found Confirmed by Wyvonnia Dusky  MD, Bland Rudzinski [phone]) on 10/11/2015 9:49:53 AM       Radiology Dg Chest 1 View  Result Date: 10/11/2015 CLINICAL DATA:  Fall. EXAM: CHEST 1 VIEW COMPARISON:  Radiographs of Jul 31, 2015. FINDINGS: The heart size and mediastinal contours are within normal limits. Both lungs are clear. No pneumothorax or pleural effusion is noted. The visualized skeletal structures are unremarkable. IMPRESSION: No acute cardiopulmonary abnormality seen. Electronically Signed   By: Marijo Conception, M.D.   On: 10/11/2015 10:53  Dg Pelvis 1-2 Views  Result Date: 10/11/2015 CLINICAL DATA:  Pain following fall EXAM: PELVIS - 1-2 VIEW COMPARISON:  July 04, 2014 FINDINGS: Patient is status post total hip replacement on the left. The left hip prosthesis shows lucency along its medial aspect on the femoral surface. Question a degree of loosening in this area. No acute fracture or dislocation. There is slight narrowing of the right hip joint, stable. No erosive change IMPRESSION: Question a degree of loosening along the medial aspect of the proximal femoral portion of a total hip prosthesis on the left. No acute fracture or dislocation. Mild narrowing right hip  joint. Electronically Signed   By: Lowella Grip III M.D.   On:  10/11/2015 10:54  Ct Head Wo Contrast  Result Date: 10/11/2015 CLINICAL DATA:  Syncopal episode this morning.  Dizziness. EXAM: CT HEAD WITHOUT CONTRAST CT CERVICAL SPINE WITHOUT CONTRAST TECHNIQUE: Multidetector CT imaging of the head and cervical spine was performed following the standard protocol without intravenous contrast. Multiplanar CT image reconstructions of the cervical spine were also generated. COMPARISON:  06/09/2008, head CT. FINDINGS: CT HEAD FINDINGS No mass effect or midline shift. No evidence of acute intracranial hemorrhage, or infarction. No abnormal extra-axial fluid collections. Gray-white matter differentiation is normal. Wedge-shaped area of hypoattenuation in the left cerebellum is felt to represent a beam hardening artifact rather than actual abnormality. Basal cisterns are preserved. No depressed skull fractures. Visualized paranasal sinuses and mastoid air cells are not opacified. CT CERVICAL SPINE FINDINGS There is normal alignment of the cervical spine. There is no evidence for acute fracture or dislocation. Mild to moderate multilevel osteoarthritic changes of the cervical spine are noted worse at C5-C6 and C6-C7. Prevertebral soft tissues have a normal appearance. Lung apices have a normal appearance. IMPRESSION: No acute intracranial abnormality.Atrophy, chronic microvascular disease. Area of hypoattenuation in the left anterior cerebellum is felt to be artifactual. If patient however displaced symptoms of cerebellar deficiency, MRI of the brain may be considered. No evidence of acute abnormality within the cervical spine. 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 Electronically Signed   By: Fidela Salisbury M.D.   On: 10/11/2015 10:34  Ct Cervical Spine Wo Contrast  Result Date: 10/11/2015 CLINICAL DATA:  Syncopal episode this morning.  Dizziness. EXAM: CT HEAD WITHOUT  CONTRAST CT CERVICAL SPINE WITHOUT CONTRAST TECHNIQUE: Multidetector CT imaging of the head and cervical spine was performed following the standard protocol without intravenous contrast. Multiplanar CT image reconstructions of the cervical spine were also generated. COMPARISON:  06/09/2008, head CT. FINDINGS: CT HEAD FINDINGS No mass effect or midline shift. No evidence of acute intracranial hemorrhage, or infarction. No abnormal extra-axial fluid collections. Gray-white matter differentiation is normal. Wedge-shaped area of hypoattenuation in the left cerebellum is felt to represent a beam hardening artifact rather than actual abnormality. Basal cisterns are preserved. No depressed skull fractures. Visualized paranasal sinuses and mastoid air cells are not opacified. CT CERVICAL SPINE FINDINGS There is normal alignment of the cervical spine. There is no evidence for acute fracture or dislocation. Mild to moderate multilevel osteoarthritic changes of the cervical spine are noted worse at C5-C6 and C6-C7. Prevertebral soft tissues have a normal appearance. Lung apices have a normal appearance. IMPRESSION: No acute intracranial abnormality.Atrophy, chronic microvascular disease. Area of hypoattenuation in the left anterior cerebellum is felt to be artifactual. If patient however displaced symptoms of cerebellar deficiency, MRI of the brain may be considered. No evidence of acute abnormality within the cervical spine. 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 Electronically Signed   By: Fidela Salisbury M.D.   On: 10/11/2015 10:34   Procedures Procedures  COORDINATION OF CARE: 9:02 AM Discussed next steps with pt. Pt verbalized understanding and is agreeable with the plan.   5:34 PM Reevaluated pt who still endorses some dizziness and difficulty ambulating. Discussed admitting pt and both she and her son are agreeable with the plan.    Medications Ordered in ED Medications   povidone-iodine (BETADINE) 10 % external solution (not administered)  feeding supplement (ENSURE ENLIVE) (ENSURE ENLIVE) liquid 237 mL (not administered)  pantoprazole (PROTONIX) EC tablet 40 mg (not administered)  citalopram (CELEXA) tablet 40 mg (not  administered)  ALPRAZolam (XANAX) tablet 0.5 mg (not administered)  sodium chloride flush (NS) 0.9 % injection 3 mL (not administered)  enoxaparin (LOVENOX) injection 40 mg (not administered)  0.9 %  sodium chloride infusion ( Intravenous Transfusing/Transfer 10/11/15 1630)  acetaminophen (TYLENOL) tablet 650 mg (not administered)    Or  acetaminophen (TYLENOL) suppository 650 mg (not administered)  senna-docusate (Senokot-S) tablet 1 tablet (not administered)  ondansetron (ZOFRAN) tablet 4 mg (not administered)    Or  ondansetron (ZOFRAN) injection 4 mg (not administered)  antiseptic oral rinse (CPC / CETYLPYRIDINIUM CHLORIDE 0.05%) solution 7 mL (not administered)  sodium chloride 0.9 % bolus 1,000 mL (0 mLs Intravenous Stopped 10/11/15 1119)  Tdap (BOOSTRIX) injection 0.5 mL (0.5 mLs Intramuscular Given 10/11/15 0917)  lidocaine (PF) (XYLOCAINE) 1 % injection 5 mL (5 mLs Intradermal Given 10/11/15 0918)  0.9 %  sodium chloride infusion ( Intravenous New Bag/Given 10/11/15 1256)     Initial Impression / Assessment and Plan / ED Course  I have reviewed the triage vital signs and the nursing notes.  Pertinent labs & imaging results that were available during my care of the patient were reviewed by me and considered in my medical decision making (see chart for details).  Clinical Course  syncopal episode without clear prodrome, now with head injury.  Poor PO intake.  No fever. No CP or SOB.  Appears pale, but hemoglobin stable. FOBT negative. CT head and C spine negative, but questionable cereballar hypodensity.  Unable to stand for orthostatics. UA negative. IVF given. Would benefit from hydration and possible brain MRI. Scalp  laceration repaired by Bjorn Loser PAC.  Admission d/w Dr. Jerilee Hoh.   I personally performed the services described in this documentation, which was scribed in my presence. The recorded information has been reviewed and is accurate.  Final Clinical Impressions(s) / ED Diagnoses   Final diagnoses:  Fall  Syncope, unspecified syncope type    New Prescriptions Current Discharge Medication List       Ezequiel Essex, MD 10/11/15 1736

## 2015-10-12 DIAGNOSIS — Y92009 Unspecified place in unspecified non-institutional (private) residence as the place of occurrence of the external cause: Secondary | ICD-10-CM | POA: Diagnosis not present

## 2015-10-12 DIAGNOSIS — Z681 Body mass index (BMI) 19 or less, adult: Secondary | ICD-10-CM | POA: Diagnosis not present

## 2015-10-12 DIAGNOSIS — H9190 Unspecified hearing loss, unspecified ear: Secondary | ICD-10-CM | POA: Diagnosis present

## 2015-10-12 DIAGNOSIS — E860 Dehydration: Secondary | ICD-10-CM | POA: Diagnosis present

## 2015-10-12 DIAGNOSIS — J45909 Unspecified asthma, uncomplicated: Secondary | ICD-10-CM | POA: Diagnosis present

## 2015-10-12 DIAGNOSIS — Z86010 Personal history of colonic polyps: Secondary | ICD-10-CM | POA: Diagnosis not present

## 2015-10-12 DIAGNOSIS — E785 Hyperlipidemia, unspecified: Secondary | ICD-10-CM

## 2015-10-12 DIAGNOSIS — Z96642 Presence of left artificial hip joint: Secondary | ICD-10-CM | POA: Diagnosis present

## 2015-10-12 DIAGNOSIS — F329 Major depressive disorder, single episode, unspecified: Secondary | ICD-10-CM | POA: Diagnosis present

## 2015-10-12 DIAGNOSIS — E43 Unspecified severe protein-calorie malnutrition: Secondary | ICD-10-CM | POA: Diagnosis present

## 2015-10-12 DIAGNOSIS — I10 Essential (primary) hypertension: Secondary | ICD-10-CM | POA: Diagnosis not present

## 2015-10-12 DIAGNOSIS — K219 Gastro-esophageal reflux disease without esophagitis: Secondary | ICD-10-CM | POA: Diagnosis present

## 2015-10-12 DIAGNOSIS — Z87891 Personal history of nicotine dependence: Secondary | ICD-10-CM | POA: Diagnosis not present

## 2015-10-12 DIAGNOSIS — Z833 Family history of diabetes mellitus: Secondary | ICD-10-CM | POA: Diagnosis not present

## 2015-10-12 DIAGNOSIS — R55 Syncope and collapse: Secondary | ICD-10-CM | POA: Diagnosis not present

## 2015-10-12 DIAGNOSIS — W19XXXA Unspecified fall, initial encounter: Secondary | ICD-10-CM | POA: Diagnosis present

## 2015-10-12 DIAGNOSIS — Z8249 Family history of ischemic heart disease and other diseases of the circulatory system: Secondary | ICD-10-CM | POA: Diagnosis not present

## 2015-10-12 DIAGNOSIS — S0101XA Laceration without foreign body of scalp, initial encounter: Secondary | ICD-10-CM | POA: Diagnosis present

## 2015-10-12 DIAGNOSIS — M1990 Unspecified osteoarthritis, unspecified site: Secondary | ICD-10-CM | POA: Diagnosis present

## 2015-10-12 DIAGNOSIS — E871 Hypo-osmolality and hyponatremia: Secondary | ICD-10-CM | POA: Diagnosis not present

## 2015-10-12 LAB — CBC
HCT: 26.8 % — ABNORMAL LOW (ref 36.0–46.0)
Hemoglobin: 9 g/dL — ABNORMAL LOW (ref 12.0–15.0)
MCH: 30.9 pg (ref 26.0–34.0)
MCHC: 33.6 g/dL (ref 30.0–36.0)
MCV: 92.1 fL (ref 78.0–100.0)
PLATELETS: 226 10*3/uL (ref 150–400)
RBC: 2.91 MIL/uL — ABNORMAL LOW (ref 3.87–5.11)
RDW: 12.4 % (ref 11.5–15.5)
WBC: 6.6 10*3/uL (ref 4.0–10.5)

## 2015-10-12 LAB — GLUCOSE, CAPILLARY: GLUCOSE-CAPILLARY: 82 mg/dL (ref 65–99)

## 2015-10-12 LAB — BASIC METABOLIC PANEL
Anion gap: 3 — ABNORMAL LOW (ref 5–15)
BUN: 10 mg/dL (ref 6–20)
CALCIUM: 8 mg/dL — AB (ref 8.9–10.3)
CO2: 24 mmol/L (ref 22–32)
CREATININE: 0.56 mg/dL (ref 0.44–1.00)
Chloride: 104 mmol/L (ref 101–111)
GFR calc Af Amer: >60 mL/min (ref >60)
GLUCOSE: 80 mg/dL (ref 65–99)
POTASSIUM: 3.7 mmol/L (ref 3.5–5.1)
SODIUM: 131 mmol/L — AB (ref 135–145)

## 2015-10-12 MED ORDER — HYDROCODONE-ACETAMINOPHEN 5-325 MG PO TABS
1.0000 | ORAL_TABLET | Freq: Four times a day (QID) | ORAL | Status: DC | PRN
  Administered 2015-10-12 – 2015-10-13 (×3): 1 via ORAL
  Filled 2015-10-12 (×3): qty 1

## 2015-10-12 NOTE — Evaluation (Signed)
Physical Therapy Evaluation Patient Details Name: Marilyn Haas MRN: [id number] DOB: [birth year] Today's Date: 10/12/2015   History of Present Illness  75 yo F admitted 10/11/2015 due to syncopal event with fall and suffered a laceration to the occipital area.  Pt reports being dizzy and weak for the last week.  She states that she has been really tired, fatigued and has not been eating well over the past 10 days as her husband has been in the hospital and she has been dedicated to taking care of him. In the emergency department she was noted to be hypotensive with systolic blood pressure in the 90s, they were not able to do a full set of orthostatics as she was unable to stand due to profound weakness. CT scan of the head was negative for acute intracranial abnormalities, although it does make mention of an area of hypoattenuation in the left anterior cerebellum that is felt to be artifactual.  MRI brain (-).  Hgb dropped from 10.6 to 9.0, and Hct from 30.2 down to 26.8 from 07/28 to 10/12/2015.  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  Clinical Impression  Pt received in bed, and her son, and dtr in law are both present.  Pt is agreeable to PT evaluation, however son and dtr in law step out during mobility assessment.  Pt is independent with bed mobility, and modified independent with sit<>stand transfers.  She had supervision with ambulation x 377ft with RW today but no LOB or gait deviations noted.  Orthostatic BP's obtained during PT session are as follows:  Supine: 135/70, HR: 85bpm Sitting: 142/77, HR: 85bpm Standing: 133/76, HR: 75 Recovery after ambulation: 159/79, HR: 79bpm  At this point, pt does not demonstrate need for skilled PT as she is functioning at modified  independent/supervision level.  Discussed this with the pt and the family, who all expressed understanding of this.  Discussed options for them in regards to assisted living as well as medical alert buttons for safety at home.  Before and after pt's evaluation, the pt's son and dtr in law pulled me aside in the hallway to express their concerns that the pt is not eating/drinking enough.  They also expressed concerns that pt is prescribed both Valium and Xanax, but may not be taking them appropriately, and that pt has had issues with alcohol in the past.  The family is concerned that these issues may be causing her to fall as well.  Pt may benefit from a home health nurse to assist with medications, and an aide to assist with every day tasks.  Of note - pt's husband was recently admitted to the hospital, and return home with the pt as his caregiver.      Follow Up Recommendations Supervision/Assistance - 24 hour;Other (comment) (Pt may benefit from [facility] nursing - family is concerned that pt is getting medications mixed up. )    Equipment Recommendations  Rolling walker with 5" wheels (Pt had one from her THA, however her husband is currently using it due to his recent hospitalization. )    Recommendations for Other Services       Precautions / Restrictions Precautions Precautions: Fall Precaution Comments: Reason for admission.  No falls in the last 6 months, however she did have a fall ~  1 year ago where she fx'd her L hip and had a THA.  Restrictions Weight Bearing Restrictions: No      Mobility  Bed Mobility Overal bed mobility: Independent                Transfers Overall transfer level: Modified independent Equipment used: Rolling walker (2 wheeled)                Ambulation/Gait Ambulation/Gait assistance: Supervision Ambulation Distance (Feet): 300 Feet Assistive device: Rolling walker (2 wheeled) Gait Pattern/deviations: WFL(Within Functional Limits)     General  Gait Details: No c/o lightheadedness or dizziness.   Stairs            Wheelchair Mobility    Modified Rankin (Stroke Patients Only)       Balance Overall balance assessment: History of Falls                                           Pertinent Vitals/Pain Pain Assessment: 0-10 Pain Score: 3  Pain Location: in her head in the front - not in the back where the laceration is located Pain Descriptors / Indicators: Dull;Aching Pain Intervention(s): Limited activity within patient's tolerance    Home Living   Living Arrangements: Spouse/significant other Available Help at Discharge:  [facility] coming out for pt's husband.  House cleaner that comes to assist with household chores. ) Type of Home: House Home Access: Stairs to enter   [employer] of Steps: 2 Home Layout: Two level;Able to live on main level with bedroom/bathroom Home Equipment: Dan Humphreys - 2 wheels;Walker - standard;Cane - single point;Bedside commode;Shower seat (husband using the RW.  Reacher and long handled shoe horn. )      Prior Function     Gait / Transfers Assistance Needed: Pt was independent with ambulation.   ADL's / Homemaking Assistance Needed: Pt's family states that she has recently required assistance for bathing.         Hand Dominance   Dominant Hand: Right    Extremity/Trunk Assessment                         Communication   Communication: HOH (Family seems to think she is confused, however it seems more HOH to me at this time.)  Cognition Arousal/Alertness: Awake/alert Behavior During Therapy: WFL for tasks assessed/performed Overall Cognitive Status: Within Functional Limits for tasks assessed                      General Comments      Exercises        Assessment/Plan    PT Assessment Patent does not need any further PT services  PT Diagnosis Generalized weakness   PT Problem List    PT Treatment Interventions     PT Goals  (Current goals can be found in the Care Plan section) Acute Rehab PT Goals PT Goal Formulation: All assessment and education complete, DC therapy    Frequency     Barriers to discharge        Co-evaluation               End of Session Equipment Utilized During Treatment: Gait belt Activity Tolerance: Patient tolerated treatment well Patient left: in chair;with family/visitor present Nurse Communication: Mobility status    Functional Assessment Tool Used: [employer] "  6-clicks"  Functional Limitation: Mobility: Walking and moving around Mobility: Walking and Moving Around Current Status [phone]): At least 20 percent but less than 40 percent impaired, limited or restricted Mobility: Walking and Moving Around Goal Status [phone]): At least 20 percent but less than 40 percent impaired, limited or restricted Mobility: Walking and Moving Around Discharge Status [phone]): At least 20 percent but less than 40 percent impaired, limited or restricted    Time: 1018-1058 PT Time Calculation (min) (ACUTE ONLY): 40 min   Charges:   PT Evaluation $PT Eval Moderate Complexity: 1 Procedure PT Treatments $Gait Training: 8-22 mins $Therapeutic Activity: 8-22 mins   PT G Codes:   PT G-Codes **NOT FOR INPATIENT CLASS** Functional Assessment Tool Used: [employer] "6-clicks"  Functional Limitation: Mobility: Walking and moving around Mobility: Walking and Moving Around Current Status [phone]): At least 20 percent but less than 40 percent impaired, limited or restricted Mobility: Walking and Moving Around Goal Status [phone]): At least 20 percent but less than 40 percent impaired, limited or restricted Mobility: Walking and Moving Around Discharge Status [phone]): At least 20 percent but less than 40 percent impaired, limited or restricted    Beth Fallen Crisostomo, PT, DPT X: [phone]

## 2015-10-12 NOTE — Progress Notes (Signed)
75 family reported that the patient does drink alcohol on a regular basis and attempts to hide it from family. The family is concerned that she may be drinking and mixing her medications with it. CIWA scale initiated and MD notified.

## 2015-10-12 NOTE — Progress Notes (Signed)
Initial Nutrition Assessment  DOCUMENTATION CODES:  Not applicable  INTERVENTION:  Nursing has documented pt's last BM was 6 days ago. May benefit from scheduled bowel regimen as her PRN senna has not been given  Ensure Enlive po BID, each supplement provides 350 kcal and 20 grams of protein  NUTRITION DIAGNOSIS:  Swallowing difficulty related to dysphagia/Esophageal stricutre as evidenced by family report of dysphagia to solids for years and need for protein shakes   GOAL:  Patient will meet greater than or equal to 90% of their needs  MONITOR:  PO intake, Supplement acceptance, Labs, I & O's  REASON FOR ASSESSMENT:  Malnutrition Screening Tool    ASSESSMENT:  75 y/o female PMHx Esophageal stricture, anxiety, gerd, htn, hld, depression, ibs. Presents with syncopal event. She reports 1 week of weakness/dizziness and poor appetite x 10 days. Found to be hypotensive in ED. Workup was negative, admitted for observation.   RD operating remotely. Per chart review. Pt had reported 10 days of "not eating well", but no quantifiable information. Pt was seen in ED ~10 days ago and reported dysphagia to solid foods that has been going on for years. Family reported pt receives much of her nutrition through nutritional shakes. Of note, Her weight has increased since that ED visit.   Per PT note, family members reported that pt has had "issues with alcohol" in the past.   Per Chart review, her weight appears to have been stable for atleast the last year.   No major nutritional concerns at this time.   Labs reviewed: Anemic  Recent Labs Lab 10/11/15 0910 10/12/15 0537  NA 129* 131*  K 4.5 3.7  CL 98* 104  CO2 28 24  BUN 13 10  CREATININE 0.58 0.56  CALCIUM 8.5* 8.0*  GLUCOSE 109* 80   Diet Order:  Diet regular Room service appropriate? Yes; Fluid consistency: Thin  Skin: Laceration to head  Last BM:  7/23  Height:  Ht Readings from Last 1 Encounters:  10/11/15 5\' 5"  (1.651  m)   Weight:  Wt Readings from Last 1 Encounters:  10/12/15 119 lb 8 oz (54.2 kg)   Wt Readings from Last 10 Encounters:  10/12/15 119 lb 8 oz (54.2 kg)  10/03/15 110 lb (49.9 kg)  07/31/15 115 lb (52.2 kg)  12/13/14 117 lb 1.6 oz (53.1 kg)  10/30/14 117 lb 1.6 oz (53.1 kg)  07/04/14 116 lb (52.6 kg)  06/26/14 116 lb 7 oz (52.8 kg)  02/20/14 125 lb (56.7 kg)  02/12/14 124 lb 8 oz (56.5 kg)  01/15/14 129 lb 14.4 oz (58.9 kg)  Admit weight: 115 lbs.   Ideal Body Weight:  56.82 kg  BMI:  Body mass index is 19.89 kg/m.  Estimated Nutritional Needs:  Kcal:  1600-1800 (31-34 kcal/kg bw0 Protein:  52-63 g (1-1.2 g/kg bw) Fluid:  >1.6 liters  EDUCATION NEEDS:  No education needs identified at this time  Burtis Junes RD, LDN, [facility] Pager: [phone] 10/12/2015 1:58 PM

## 2015-10-12 NOTE — Progress Notes (Signed)
0851 patient c/o nausea and small amount of clear mucous, brown noted in emesis pan. PRN zofran given as ordered, relief reported. MD notified.

## 2015-10-12 NOTE — Progress Notes (Signed)
PROGRESS NOTE    Marilyn Haas  [id number] DOB: July 03, 1940 DOA: 10/11/2015 PCP: Purvis Kilts, MD     Brief Narrative:  75 year old woman admitted on 7/28 from home due to dizziness, weakness and a syncopal episode. She was hyponatremic on admission.   Assessment & Plan:   Principal Problem:   Syncope Active Problems:   Hyperlipidemia   Hypertension   Hyponatremia   Syncopal episode -Suspect due to malnutrition and dehydration as well as hyponatremia. -CT head and MRI of the brain without evidence for CVA. -So far telemetry has not shown any arrhythmias. -Echo in October 2016 without evidence for aortic stenosis, see no need to repeat echo at this time. -Patient's family members brought up concerns about patient being a closet alcoholic, patient is clear and adamant to me that she was a heavy drinker until November 2016 at which time she quit cold [place] and has not had anything to drink since.  Hyponatremia -Due to poor oral intake and dehydration. -Sodium improving with IV fluids.  Depression -Continue home medications.   DVT prophylaxis: Lovenox Code Status: Full code Family Communication: Daughter-in-law at bedside updated on plan of care Disposition Plan: Home when ready, likely 24 hours  Consultants:   None  Procedures:   None  Antimicrobials:   None    Subjective: Feels a little stronger, no chest pain or shortness of breath  Objective: Vitals:   10/12/15 0645 10/12/15 1056 10/12/15 1400 10/12/15 1540  BP: 117/67 (!) 159/79 (!) 142/65   Pulse: 73 79 89   Resp: 20  20   Temp: 98.5 F (36.9 C)  97.5 F (36.4 C)   TempSrc: Oral  Oral   SpO2: 98%  99% 97%  Weight: 54.2 kg (119 lb 8 oz)     Height:        Intake/Output Summary (Last 24 hours) at 10/12/15 1635 Last data filed at 10/12/15 1100  Gross per 24 hour  Intake                0 ml  Output             1300 ml  Net            -1300 ml   Filed Weights   10/11/15  0851 10/11/15 1545 10/12/15 0645  Weight: 52.2 kg (115 lb) 54.2 kg (119 lb 6.4 oz) 54.2 kg (119 lb 8 oz)    Examination:  General exam: Alert, awake, oriented x 3 Respiratory system: Clear to auscultation. Respiratory effort normal. Cardiovascular system:RRR. No murmurs, rubs, gallops. Gastrointestinal system: Abdomen is nondistended, soft and nontender. No organomegaly or masses felt. Normal bowel sounds heard. Central nervous system: Alert and oriented. No focal neurological deficits. Extremities: No C/C/E, +pedal pulses Skin: No rashes, lesions or ulcers Psychiatry: Judgement and insight appear normal. Mood & affect appropriate.     Data Reviewed: I have personally reviewed following labs and imaging studies  CBC:  Recent Labs Lab 10/11/15 0910 10/12/15 0537  WBC 6.3 6.6  NEUTROABS 4.0  --   HGB 10.6* 9.0*  HCT 30.2* 26.8*  MCV 91.2 92.1  PLT 248 [id number]   Basic Metabolic Panel:  Recent Labs Lab 10/11/15 0910 10/12/15 0537  NA 129* 131*  K 4.5 3.7  CL 98* 104  CO2 28 24  GLUCOSE 109* 80  BUN 13 10  CREATININE 0.58 0.56  CALCIUM 8.5* 8.0*   GFR: Estimated Creatinine Clearance: 51.2 mL/min (by C-G formula based on SCr  of 0.8 mg/dL). Liver Function Tests: No results for input(s): AST, ALT, ALKPHOS, BILITOT, PROT, ALBUMIN in the last 168 hours. No results for input(s): LIPASE, AMYLASE in the last 168 hours. No results for input(s): AMMONIA in the last 168 hours. Coagulation Profile:  Recent Labs Lab 10/11/15 0910  INR 1.03   Cardiac Enzymes:  Recent Labs Lab 10/11/15 0910  TROPONINI <0.03   BNP (last 3 results) No results for input(s): PROBNP in the last 8760 hours. HbA1C: No results for input(s): HGBA1C in the last 72 hours. CBG:  Recent Labs Lab 10/12/15 0719  GLUCAP 82   Lipid Profile: No results for input(s): CHOL, HDL, LDLCALC, TRIG, CHOLHDL, LDLDIRECT in the last 72 hours. Thyroid Function Tests: No results for input(s): TSH, T4TOTAL,  FREET4, T3FREE, THYROIDAB in the last 72 hours. Anemia Panel: No results for input(s): VITAMINB12, FOLATE, FERRITIN, TIBC, IRON, RETICCTPCT in the last 72 hours. Urine analysis:    Component Value Date/Time   COLORURINE YELLOW 10/11/2015 [place] 10/11/2015 1211   LABSPEC <1.005 (L) 10/11/2015 1211   PHURINE 6.5 10/11/2015 1211   GLUCOSEU 100 (A) 10/11/2015 1211   HGBUR TRACE (A) 10/11/2015 1211   BILIRUBINUR NEGATIVE 10/11/2015 1211   KETONESUR NEGATIVE 10/11/2015 1211   PROTEINUR NEGATIVE 10/11/2015 1211   UROBILINOGEN 1.0 06/26/2014 1400   NITRITE NEGATIVE 10/11/2015 1211   LEUKOCYTESUR NEGATIVE 10/11/2015 1211   Sepsis Labs: @LABRCNTIP (procalcitonin:4,lacticidven:4)  )No results found for this or any previous visit (from the past 240 hour(s)).       Radiology Studies: Dg Chest 1 View  Result Date: 10/11/2015 CLINICAL DATA:  Fall. EXAM: CHEST 1 VIEW COMPARISON:  Radiographs of Jul 31, 2015. FINDINGS: The heart size and mediastinal contours are within normal limits. Both lungs are clear. No pneumothorax or pleural effusion is noted. The visualized skeletal structures are unremarkable. IMPRESSION: No acute cardiopulmonary abnormality seen. Electronically Signed   By: Marijo Conception, M.D.   On: 10/11/2015 10:53  Dg Pelvis 1-2 Views  Result Date: 10/11/2015 CLINICAL DATA:  Pain following fall EXAM: PELVIS - 1-2 VIEW COMPARISON:  July 04, 2014 FINDINGS: Patient is status post total hip replacement on the left. The left hip prosthesis shows lucency along its medial aspect on the femoral surface. Question a degree of loosening in this area. No acute fracture or dislocation. There is slight narrowing of the right hip joint, stable. No erosive change IMPRESSION: Question a degree of loosening along the medial aspect of the proximal femoral portion of a total hip prosthesis on the left. No acute fracture or dislocation. Mild narrowing right hip joint. Electronically  Signed   By: Lowella Grip III M.D.   On: 10/11/2015 10:54  Ct Head Wo Contrast  Result Date: 10/11/2015 CLINICAL DATA:  Syncopal episode this morning.  Dizziness. EXAM: CT HEAD WITHOUT CONTRAST CT CERVICAL SPINE WITHOUT CONTRAST TECHNIQUE: Multidetector CT imaging of the head and cervical spine was performed following the standard protocol without intravenous contrast. Multiplanar CT image reconstructions of the cervical spine were also generated. COMPARISON:  06/09/2008, head CT. FINDINGS: CT HEAD FINDINGS No mass effect or midline shift. No evidence of acute intracranial hemorrhage, or infarction. No abnormal extra-axial fluid collections. Gray-white matter differentiation is normal. Wedge-shaped area of hypoattenuation in the left cerebellum is felt to represent a beam hardening artifact rather than actual abnormality. Basal cisterns are preserved. No depressed skull fractures. Visualized paranasal sinuses and mastoid air cells are not opacified. CT CERVICAL SPINE FINDINGS There is  normal alignment of the cervical spine. There is no evidence for acute fracture or dislocation. Mild to moderate multilevel osteoarthritic changes of the cervical spine are noted worse at C5-C6 and C6-C7. Prevertebral soft tissues have a normal appearance. Lung apices have a normal appearance. IMPRESSION: No acute intracranial abnormality.Atrophy, chronic microvascular disease. Area of hypoattenuation in the left anterior cerebellum is felt to be artifactual. If patient however displaced symptoms of cerebellar deficiency, MRI of the brain may be considered. No evidence of acute abnormality within the cervical spine. 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 Electronically Signed   By: Fidela Salisbury M.D.   On: 10/11/2015 10:34  Ct Cervical Spine Wo Contrast  Result Date: 10/11/2015 CLINICAL DATA:  Syncopal episode this morning.  Dizziness. EXAM: CT HEAD WITHOUT CONTRAST CT CERVICAL  SPINE WITHOUT CONTRAST TECHNIQUE: Multidetector CT imaging of the head and cervical spine was performed following the standard protocol without intravenous contrast. Multiplanar CT image reconstructions of the cervical spine were also generated. COMPARISON:  06/09/2008, head CT. FINDINGS: CT HEAD FINDINGS No mass effect or midline shift. No evidence of acute intracranial hemorrhage, or infarction. No abnormal extra-axial fluid collections. Gray-white matter differentiation is normal. Wedge-shaped area of hypoattenuation in the left cerebellum is felt to represent a beam hardening artifact rather than actual abnormality. Basal cisterns are preserved. No depressed skull fractures. Visualized paranasal sinuses and mastoid air cells are not opacified. CT CERVICAL SPINE FINDINGS There is normal alignment of the cervical spine. There is no evidence for acute fracture or dislocation. Mild to moderate multilevel osteoarthritic changes of the cervical spine are noted worse at C5-C6 and C6-C7. Prevertebral soft tissues have a normal appearance. Lung apices have a normal appearance. IMPRESSION: No acute intracranial abnormality.Atrophy, chronic microvascular disease. Area of hypoattenuation in the left anterior cerebellum is felt to be artifactual. If patient however displaced symptoms of cerebellar deficiency, MRI of the brain may be considered. No evidence of acute abnormality within the cervical spine. 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 Electronically Signed   By: Fidela Salisbury M.D.   On: 10/11/2015 10:34  Mr Brain Wo Contrast  Result Date: 10/11/2015 CLINICAL DATA:  Dizziness with fall.  Laceration. EXAM: MRI HEAD WITHOUT CONTRAST TECHNIQUE: Multiplanar, multiecho pulse sequences of the brain and surrounding structures were obtained without intravenous contrast. COMPARISON:  CT head earlier today.  Also prior CT from 2010. FINDINGS: The patient was unable to remain  motionless for the exam. Small or subtle lesions could be overlooked. No evidence for acute infarction, hemorrhage, mass lesion, hydrocephalus, or extra-axial fluid. A mild cerebral and cerebellar atrophy, not unexpected for age. Mild subcortical and periventricular T2 and FLAIR hyperintensities, likely chronic microvascular ischemic change. Flow voids are maintained throughout the carotid, basilar, and vertebral arteries. There are no areas of chronic hemorrhage. Pituitary, pineal, and cerebellar tonsils unremarkable. No upper cervical lesions. Visualized calvarium, skull base, and upper cervical osseous structures unremarkable. Scalp and extracranial soft tissues, orbits, sinuses, and mastoids show no acute process. IMPRESSION: Chronic changes as described.  No acute intracranial abnormality. No posttraumatic sequelae is evident. Electronically Signed   By: Staci Righter M.D.   On: 10/11/2015 19:15       Scheduled Meds: . antiseptic oral rinse  7 mL Mouth Rinse BID  . citalopram  40 mg Oral Daily  . enoxaparin (LOVENOX) injection  40 mg Subcutaneous Q24H  . feeding supplement (ENSURE ENLIVE)  237 mL Oral BID BM  . pantoprazole  40 mg Oral Daily  . sodium chloride flush  3 mL Intravenous Q12H   Continuous Infusions: . sodium chloride       LOS: 0 days    Time spent: 25 minutes. Greater than 50% of this time was spent in direct contact with the patient coordinating care.     Lelon Frohlich, MD [facility] Pager [phone]  If 7PM-7AM, please contact night-coverage [email or web address] Password [facility] 10/12/2015, 4:35 PM

## 2015-10-13 LAB — BASIC METABOLIC PANEL
Anion gap: 3 — ABNORMAL LOW (ref 5–15)
BUN: 9 mg/dL (ref 6–20)
CHLORIDE: 106 mmol/L (ref 101–111)
CO2: 26 mmol/L (ref 22–32)
CREATININE: 0.64 mg/dL (ref 0.44–1.00)
Calcium: 8.4 mg/dL — ABNORMAL LOW (ref 8.9–10.3)
GFR calc non Af Amer: >60 mL/min (ref >60)
Glucose, Bld: 83 mg/dL (ref 65–99)
POTASSIUM: 3.8 mmol/L (ref 3.5–5.1)
SODIUM: 135 mmol/L (ref 135–145)

## 2015-10-13 LAB — GLUCOSE, CAPILLARY: GLUCOSE-CAPILLARY: 77 mg/dL (ref 65–99)

## 2015-10-13 NOTE — Progress Notes (Signed)
0820 PO intake has improved and patient reports that she is feeling much better. Patient currently sitting up in bed eating breakfast at this time, reporting that her headache pain has improved with PO Norco.

## 2015-10-13 NOTE — Progress Notes (Signed)
1345 this Probation officer assisted patient with shower and grooming to prepare for d/c home. Patient belongings gathered and patient confirmed she has everything she came with. D/C instructions and paperwork given to patient. IV catheter removed from LEFT arm, intact w/no s/s of infection noted. Telemetry wiring and monitor removed from patient and central telemetry notified and made aware. Patient's son Marilyn Haas called and is coming to pick patient up from facility.
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
## 2015-10-16 DIAGNOSIS — Z1389 Encounter for screening for other disorder: Secondary | ICD-10-CM | POA: Diagnosis not present

## 2015-10-16 DIAGNOSIS — E538 Deficiency of other specified B group vitamins: Secondary | ICD-10-CM | POA: Diagnosis not present

## 2015-10-16 DIAGNOSIS — R7309 Other abnormal glucose: Secondary | ICD-10-CM | POA: Diagnosis not present

## 2015-10-16 DIAGNOSIS — R42 Dizziness and giddiness: Secondary | ICD-10-CM | POA: Diagnosis not present

## 2015-10-16 DIAGNOSIS — R002 Palpitations: Secondary | ICD-10-CM | POA: Diagnosis not present

## 2015-10-16 DIAGNOSIS — R55 Syncope and collapse: Secondary | ICD-10-CM | POA: Diagnosis not present

## 2015-10-16 DIAGNOSIS — Z6820 Body mass index (BMI) 20.0-20.9, adult: Secondary | ICD-10-CM | POA: Diagnosis not present

## 2015-10-16 DIAGNOSIS — R0602 Shortness of breath: Secondary | ICD-10-CM | POA: Diagnosis not present

## 2015-10-16 DIAGNOSIS — R5383 Other fatigue: Secondary | ICD-10-CM | POA: Diagnosis not present

## 2015-10-22 ENCOUNTER — Ambulatory Visit (INDEPENDENT_AMBULATORY_CARE_PROVIDER_SITE_OTHER): Payer: Medicare Other | Admitting: Internal Medicine

## 2015-10-23 DIAGNOSIS — Z6379 Other stressful life events affecting family and household: Secondary | ICD-10-CM | POA: Diagnosis not present

## 2015-10-23 DIAGNOSIS — R030 Elevated blood-pressure reading, without diagnosis of hypertension: Secondary | ICD-10-CM | POA: Diagnosis not present

## 2015-10-23 DIAGNOSIS — R55 Syncope and collapse: Secondary | ICD-10-CM | POA: Diagnosis not present

## 2015-10-23 DIAGNOSIS — M25559 Pain in unspecified hip: Secondary | ICD-10-CM | POA: Diagnosis not present

## 2015-10-23 DIAGNOSIS — E782 Mixed hyperlipidemia: Secondary | ICD-10-CM | POA: Diagnosis not present

## 2015-10-23 DIAGNOSIS — Q393 Congenital stenosis and stricture of esophagus: Secondary | ICD-10-CM | POA: Diagnosis not present

## 2015-10-23 DIAGNOSIS — Z72820 Sleep deprivation: Secondary | ICD-10-CM | POA: Diagnosis not present

## 2015-10-29 DIAGNOSIS — R42 Dizziness and giddiness: Secondary | ICD-10-CM | POA: Diagnosis not present

## 2015-10-29 DIAGNOSIS — Z79891 Long term (current) use of opiate analgesic: Secondary | ICD-10-CM | POA: Diagnosis not present

## 2015-10-29 DIAGNOSIS — R531 Weakness: Secondary | ICD-10-CM | POA: Diagnosis not present

## 2015-11-12 DIAGNOSIS — D519 Vitamin B12 deficiency anemia, unspecified: Secondary | ICD-10-CM | POA: Diagnosis not present

## 2015-11-12 DIAGNOSIS — Z96649 Presence of unspecified artificial hip joint: Secondary | ICD-10-CM | POA: Diagnosis not present

## 2015-11-12 DIAGNOSIS — E871 Hypo-osmolality and hyponatremia: Secondary | ICD-10-CM | POA: Diagnosis not present

## 2015-11-12 DIAGNOSIS — R030 Elevated blood-pressure reading, without diagnosis of hypertension: Secondary | ICD-10-CM | POA: Diagnosis not present

## 2015-11-12 DIAGNOSIS — D649 Anemia, unspecified: Secondary | ICD-10-CM | POA: Diagnosis not present

## 2015-11-12 DIAGNOSIS — Z6379 Other stressful life events affecting family and household: Secondary | ICD-10-CM | POA: Diagnosis not present

## 2015-11-19 DIAGNOSIS — J069 Acute upper respiratory infection, unspecified: Secondary | ICD-10-CM | POA: Diagnosis not present

## 2015-11-19 DIAGNOSIS — J329 Chronic sinusitis, unspecified: Secondary | ICD-10-CM | POA: Diagnosis not present

## 2015-11-27 DIAGNOSIS — F331 Major depressive disorder, recurrent, moderate: Secondary | ICD-10-CM | POA: Diagnosis not present

## 2015-11-27 DIAGNOSIS — Z23 Encounter for immunization: Secondary | ICD-10-CM | POA: Diagnosis not present

## 2015-11-27 DIAGNOSIS — D510 Vitamin B12 deficiency anemia due to intrinsic factor deficiency: Secondary | ICD-10-CM | POA: Diagnosis not present

## 2015-11-27 DIAGNOSIS — G894 Chronic pain syndrome: Secondary | ICD-10-CM | POA: Diagnosis not present

## 2015-11-27 DIAGNOSIS — F4322 Adjustment disorder with anxiety: Secondary | ICD-10-CM | POA: Diagnosis not present

## 2015-11-27 DIAGNOSIS — F419 Anxiety disorder, unspecified: Secondary | ICD-10-CM | POA: Diagnosis not present

## 2015-11-27 DIAGNOSIS — G4700 Insomnia, unspecified: Secondary | ICD-10-CM | POA: Diagnosis not present

## 2015-11-27 DIAGNOSIS — Z6820 Body mass index (BMI) 20.0-20.9, adult: Secondary | ICD-10-CM | POA: Diagnosis not present

## 2015-12-03 ENCOUNTER — Ambulatory Visit: Payer: Medicare Other | Admitting: Internal Medicine

## 2015-12-19 ENCOUNTER — Ambulatory Visit (INDEPENDENT_AMBULATORY_CARE_PROVIDER_SITE_OTHER): Payer: Medicare Other | Admitting: Otolaryngology

## 2015-12-19 DIAGNOSIS — H903 Sensorineural hearing loss, bilateral: Secondary | ICD-10-CM | POA: Diagnosis not present

## 2015-12-24 DIAGNOSIS — F329 Major depressive disorder, single episode, unspecified: Secondary | ICD-10-CM | POA: Diagnosis not present

## 2015-12-24 DIAGNOSIS — I10 Essential (primary) hypertension: Secondary | ICD-10-CM | POA: Diagnosis not present

## 2015-12-24 DIAGNOSIS — G894 Chronic pain syndrome: Secondary | ICD-10-CM | POA: Diagnosis not present

## 2015-12-24 DIAGNOSIS — F419 Anxiety disorder, unspecified: Secondary | ICD-10-CM | POA: Diagnosis not present

## 2015-12-24 DIAGNOSIS — Z1389 Encounter for screening for other disorder: Secondary | ICD-10-CM | POA: Diagnosis not present

## 2015-12-24 DIAGNOSIS — Z6820 Body mass index (BMI) 20.0-20.9, adult: Secondary | ICD-10-CM | POA: Diagnosis not present

## 2016-01-03 ENCOUNTER — Telehealth: Payer: Self-pay | Admitting: Internal Medicine

## 2016-01-03 NOTE — Telephone Encounter (Signed)
Marilyn Haas is wanting to change from Dr. Debara Pickett yo Dr. Domenic Polite in the [place] office , because it is more convenient for her to get there . Will that be alright with you?   Thanks   [facility]

## 2016-01-03 NOTE — Telephone Encounter (Signed)
If that is better for her, I'm fine with it. I just saw her husband, does he plan to transfer there as well?  Dr. Lemmie Evens

## 2016-01-05 NOTE — Telephone Encounter (Signed)
OK 

## 2016-01-09 DIAGNOSIS — H2513 Age-related nuclear cataract, bilateral: Secondary | ICD-10-CM | POA: Diagnosis not present

## 2016-01-09 DIAGNOSIS — H524 Presbyopia: Secondary | ICD-10-CM | POA: Diagnosis not present

## 2016-01-09 DIAGNOSIS — H401421 Capsular glaucoma with pseudoexfoliation of lens, left eye, mild stage: Secondary | ICD-10-CM | POA: Diagnosis not present

## 2016-01-17 ENCOUNTER — Telehealth: Payer: Self-pay | Admitting: Internal Medicine

## 2016-01-17 NOTE — Telephone Encounter (Signed)
Closed encounter °

## 2016-01-30 DIAGNOSIS — E860 Dehydration: Secondary | ICD-10-CM | POA: Diagnosis not present

## 2016-01-30 DIAGNOSIS — R531 Weakness: Secondary | ICD-10-CM | POA: Diagnosis not present

## 2016-01-30 DIAGNOSIS — R42 Dizziness and giddiness: Secondary | ICD-10-CM | POA: Diagnosis not present

## 2016-02-12 ENCOUNTER — Encounter: Payer: Self-pay | Admitting: Cardiology

## 2016-02-12 NOTE — Progress Notes (Signed)
Cardiology Office Note  Date: 02/13/2016   ID: Jovanda, Crill 26-Aug-1940, MRN [id number]  PCP: Purvis Kilts, MD  Evaluating Cardiologist: Rozann Lesches, MD   Chief Complaint  Patient presents with  . Cardiac follow-up    History of Present Illness: Marilyn Haas is a 75 y.o. female former patient of Dr. Debara Pickett, now establishing follow-up with me. I reviewed her records and updated the chart.She is here today with her son (who drove up from [place] this morning) and husband. She has been under a lot of stress dealing with her husband's illness. Admits that she has not been eating regularly or drinking fluids regular. She reports episodes of dizziness and lightheadedness that sound to be orthostatic. She has undergone neuroimaging, had a brain MRI back in July that showed no acute process. She has had no obvious sustained arrhythmias.  She has a history of PVCs with normal LVEF. Echocardiogram from last year is outlined below. No follow-up studies since that time. She is not on any cardiac medications at this point. States that she was taken off statin by PCP related to abnormal LFTs.  Past Medical History:  Diagnosis Date  . Anemia   . Anxiety   . Arthritis   . Childhood asthma   . Colonic polyp    Adenomatous polyp. No high grade dysplasia or invasive malignancy. 07/06/2006  . Depression   . Dysphagia   . Esophageal stricture   . Essential hypertension   . GERD (gastroesophageal reflux disease)   . Hiatal hernia   . History of blood transfusion   . History of pneumonia   . Hyperlipidemia   . Impaired hearing   . Irritable bowel syndrome   . PVC's (premature ventricular contractions)   . Scarlet fever     Past Surgical History:  Procedure Laterality Date  . Colonscopy    . 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 07/04/2014   Procedure: LEFT CONVERSION TO TOTAL HIP ARTHROPLASTY;  Surgeon: Gaynelle Arabian, MD;  Location: [facility];  Service: Orthopedics;   Laterality: Left;  . ESOPHAGOGASTRODUODENOSCOPY N/A 01/26/2014   Procedure: ESOPHAGOGASTRODUODENOSCOPY (EGD);  Surgeon: Rogene Houston, MD;  Location: [facility];  Service: Endoscopy;  Laterality: N/A;  1030  . HIP FRACTURE SURGERY     05/2009 left hip   . MALONEY DILATION N/A 01/26/2014   Procedure: Venia Minks DILATION;  Surgeon: Rogene Houston, MD;  Location: [facility];  Service: Endoscopy;  Laterality: N/A;    Current Outpatient Prescriptions  Medication Sig Dispense Refill  . ALPRAZolam (XANAX) 0.5 MG tablet Take 0.5 mg by mouth at bedtime as needed for anxiety.    . DULoxetine (CYMBALTA) 20 MG capsule Take 20 mg by mouth daily. Takes 2 tabs daily    . HYDROcodone-acetaminophen (NORCO) 10-325 MG tablet Take 1 tablet by mouth every 6 (six) hours as needed.  0   No current facility-administered medications for this visit.    Allergies:  Avelox [moxifloxacin hcl in nacl]   Social History: The patient  reports that she quit smoking about 55 years ago. Her smoking use included Cigarettes. She has a 1.00 pack-year smoking history. She has never used smokeless tobacco. She reports that she drinks alcohol. She reports that she does not use drugs.   ROS:  Please see the history of present illness. Otherwise, complete review of systems is positive for anxiety.  All other systems are reviewed and negative.   Physical Exam: VS:  BP (!) 156/84  Pulse 82   Ht 5\' 5"  (1.651 m)   Wt 123 lb (55.8 kg)   SpO2 99%   BMI 20.47 kg/m , BMI Body mass index is 20.47 kg/m.  Wt Readings from Last 3 Encounters:  02/13/16 123 lb (55.8 kg)  10/13/15 119 lb (54 kg)  10/03/15 110 lb (49.9 kg)    General: Frail-appearing elderly woman, appears comfortable at rest. HEENT: Conjunctiva and lids normal, oropharynx clear. Neck: Supple, no elevated JVP or carotid bruits, no thyromegaly. Lungs: Clear to auscultation, nonlabored breathing at rest. Cardiac: Regular rate and rhythm, no S3 or significant  systolic murmur, no pericardial rub. Abdomen: Soft, nontender, bowel sounds present. Extremities: No pitting edema, distal pulses 2+. Skin: Warm and dry. Musculoskeletal: Mild kyphosis. Neuropsychiatric: Alert and oriented x3, affect grossly appropriate.  ECG: I personally reviewed the tracing from 10/11/2015 which showed normal sinus rhythm.  Recent Labwork: 07/31/2015: ALT 14; AST 20 10/12/2015: Hemoglobin 9.0; Platelets 226 10/13/2015: BUN 9; Creatinine, Ser 0.64; Potassium 3.8; Sodium 135     Component Value Date/Time   CHOL 209 (H) 12/13/2014 1132   CHOL 150 02/17/2013 0934   TRIG 95 12/13/2014 1132   TRIG 111 02/17/2013 0934   HDL 114 12/13/2014 1132   HDL 40 02/17/2013 0934   CHOLHDL 1.8 12/13/2014 1132   VLDL 19 12/13/2014 1132   LDLCALC 76 12/13/2014 1132   LDLCALC 88 02/17/2013 0934    Other Studies Reviewed Today:  Echocardiogram 12/25/2014: Study Conclusions  - Left ventricle: The cavity size was mildly dilated. Wall   thickness was normal. Systolic function was normal. The estimated   ejection fraction was in the range of 55% to 60%. Wall motion was   normal; there were no regional wall motion abnormalities. - Aortic valve: There was mild regurgitation. - Atrial septum: No defect or patent foramen ovale was identified.  Assessment and Plan:  1. Intermittent lightheadedness, possibly contributed to by stress, not eating or drinking fluids regularly. No definite arrhythmia documented and no acute process by neuroimaging in July. We will obtain a follow-up echocardiogram to ensure no change in LVEF with prior history of PVCs.  2. Blood pressure is elevated today, although reluctant to start her on any medications in light of intermittent orthostasis.  3. LDL 76 back in September of last year. Patient states she was previously on statin although this was stopped secondary to abnormal liver function tests. She follows with [place].  Current medicines were reviewed  with the patient today.   Orders Placed This Encounter  Procedures  . ECHOCARDIOGRAM COMPLETE    Disposition: Call with test results. Follow-up in one year, sooner if needed.  Signed, Satira Sark, MD, [facility] 02/13/2016 9:24 AM    [facility] [place]. [address] [place], [place] [address] Phone: [phone]; Fax: [phone]

## 2016-02-13 ENCOUNTER — Ambulatory Visit (INDEPENDENT_AMBULATORY_CARE_PROVIDER_SITE_OTHER): Payer: Medicare Other | Admitting: Cardiology

## 2016-02-13 ENCOUNTER — Encounter: Payer: Self-pay | Admitting: Cardiology

## 2016-02-13 VITALS — BP 156/84 | HR 82 | Ht 65.0 in | Wt 123.0 lb

## 2016-02-13 DIAGNOSIS — I493 Ventricular premature depolarization: Secondary | ICD-10-CM

## 2016-02-13 DIAGNOSIS — R002 Palpitations: Secondary | ICD-10-CM

## 2016-02-13 DIAGNOSIS — R42 Dizziness and giddiness: Secondary | ICD-10-CM

## 2016-02-13 DIAGNOSIS — Z8639 Personal history of other endocrine, nutritional and metabolic disease: Secondary | ICD-10-CM | POA: Diagnosis not present

## 2016-02-13 NOTE — Patient Instructions (Signed)
Your physician wants you to follow-up in: 1 year Dr McDowell You will receive a reminder letter in the mail two months in advance. If you don't receive a letter, please call our office to schedule the follow-up appointment.   Your physician recommends that you continue on your current medications as directed. Please refer to the Current Medication list given to you today.    Your physician has requested that you have an echocardiogram. Echocardiography is a painless test that uses sound waves to create images of your heart. It provides your doctor with information about the size and shape of your heart and how well your heart's chambers and valves are working. This procedure takes approximately one hour. There are no restrictions for this procedure.     Thank you for choosing [facility] !        

## 2016-02-18 ENCOUNTER — Ambulatory Visit (HOSPITAL_COMMUNITY)
Admission: RE | Admit: 2016-02-18 | Discharge: 2016-02-18 | Disposition: A | Discharge status: Home / Self Care | Payer: Medicare Other | Source: Ambulatory Visit | Attending: Cardiology | Admitting: Cardiology

## 2016-02-18 DIAGNOSIS — I082 Rheumatic disorders of both aortic and tricuspid valves: Secondary | ICD-10-CM | POA: Insufficient documentation

## 2016-02-18 DIAGNOSIS — R42 Dizziness and giddiness: Secondary | ICD-10-CM | POA: Insufficient documentation

## 2016-02-18 DIAGNOSIS — R002 Palpitations: Secondary | ICD-10-CM | POA: Insufficient documentation

## 2016-02-18 DIAGNOSIS — I700 Atherosclerosis of aorta: Secondary | ICD-10-CM | POA: Insufficient documentation

## 2016-02-18 LAB — ECHOCARDIOGRAM COMPLETE
AVPHT: 437 ms
CHL CUP MV DEC (S): 197
CHL CUP RV SYS PRESS: 22 mmHg
CHL CUP TV REG PEAK VELOCITY: 220 cm/s
E decel time: 197 msec
EERAT: 11.7
FS: 31 % (ref 28–44)
IVS/LV PW RATIO, ED: 1.13
LA ID, A-P, ES: 26 mm
LA diam index: 1.63 cm/m2
LA vol index: 22.2 mL/m2
LA vol: 35.4 mL
LAVOLA4C: 35.7 mL
LEFT ATRIUM END SYS DIAM: 26 mm
LV E/e'average: 11.7
LV PW d: 10.8 mm — AB (ref 0.6–1.1)
LV TDI E'LATERAL: 5.87
LV TDI E'MEDIAL: 5.33
LV dias vol index: 26 mL/m2
LV dias vol: 42 mL — AB (ref 46–106)
LV e' LATERAL: 5.87 cm/s
LV sys vol index: 10 mL/m2
LV sys vol: 16 mL (ref 14–42)
LVEEMED: 11.7
LVOT VTI: 22.9 cm
LVOT area: 3.14 cm2
LVOT peak grad rest: 4 mmHg
LVOT peak vel: 101 cm/s
LVOTD: 20 mm
LVOTSV: 72 mL
Lateral S' vel: 9.46 cm/s
MV pk E vel: 68.7 m/s
MVPKAVEL: 87.5 m/s
Simpson's disk: 61
Stroke v: 26 ml
TAPSE: 21.5 mm
TRMAXVEL: 220 cm/s

## 2016-02-18 NOTE — Progress Notes (Signed)
*  PRELIMINARY RESULTS* Echocardiogram 2D Echocardiogram has been performed.  Marilyn Haas 02/18/2016, 12:29 PM

## 2016-04-14 DIAGNOSIS — Z6822 Body mass index (BMI) 22.0-22.9, adult: Secondary | ICD-10-CM | POA: Diagnosis not present

## 2016-04-14 DIAGNOSIS — G894 Chronic pain syndrome: Secondary | ICD-10-CM | POA: Diagnosis not present

## 2016-04-14 DIAGNOSIS — F419 Anxiety disorder, unspecified: Secondary | ICD-10-CM | POA: Diagnosis not present

## 2016-04-17 ENCOUNTER — Encounter: Payer: Self-pay | Admitting: Internal Medicine

## 2016-04-17 ENCOUNTER — Encounter: Payer: Self-pay | Admitting: *Deleted

## 2016-04-22 ENCOUNTER — Ambulatory Visit (HOSPITAL_COMMUNITY)
Admission: RE | Admit: 2016-04-22 | Discharge: 2016-04-22 | Disposition: A | Discharge status: Home / Self Care | Payer: Medicare Other | Source: Ambulatory Visit | Attending: Internal Medicine | Admitting: Internal Medicine

## 2016-04-22 ENCOUNTER — Ambulatory Visit (HOSPITAL_COMMUNITY)
Admission: RE | Admit: 2016-04-22 | Discharge: 2016-04-22 | Disposition: A | Discharge status: Home / Self Care | Payer: Medicare Other | Source: Ambulatory Visit | Attending: Physician Assistant | Admitting: Physician Assistant

## 2016-04-22 ENCOUNTER — Other Ambulatory Visit (HOSPITAL_COMMUNITY): Payer: Self-pay | Admitting: Internal Medicine

## 2016-04-22 DIAGNOSIS — M85831 Other specified disorders of bone density and structure, right forearm: Secondary | ICD-10-CM | POA: Diagnosis not present

## 2016-04-22 DIAGNOSIS — M8589 Other specified disorders of bone density and structure, multiple sites: Secondary | ICD-10-CM | POA: Diagnosis not present

## 2016-04-22 DIAGNOSIS — T8484XS Pain due to internal orthopedic prosthetic devices, implants and grafts, sequela: Secondary | ICD-10-CM | POA: Diagnosis not present

## 2016-04-22 DIAGNOSIS — Z1231 Encounter for screening mammogram for malignant neoplasm of breast: Secondary | ICD-10-CM | POA: Diagnosis not present

## 2016-04-22 DIAGNOSIS — Z79899 Other long term (current) drug therapy: Secondary | ICD-10-CM

## 2016-04-22 DIAGNOSIS — E2839 Other primary ovarian failure: Secondary | ICD-10-CM | POA: Insufficient documentation

## 2016-04-22 DIAGNOSIS — E441 Mild protein-calorie malnutrition: Secondary | ICD-10-CM | POA: Insufficient documentation

## 2016-04-22 DIAGNOSIS — M85851 Other specified disorders of bone density and structure, right thigh: Secondary | ICD-10-CM | POA: Diagnosis not present

## 2016-04-27 ENCOUNTER — Other Ambulatory Visit (HOSPITAL_COMMUNITY): Payer: Self-pay | Admitting: Internal Medicine

## 2016-04-27 DIAGNOSIS — N6489 Other specified disorders of breast: Secondary | ICD-10-CM

## 2016-05-12 ENCOUNTER — Ambulatory Visit (HOSPITAL_COMMUNITY)
Admission: RE | Admit: 2016-05-12 | Discharge: 2016-05-12 | Disposition: A | Discharge status: Home / Self Care | Payer: Medicare Other | Source: Ambulatory Visit | Attending: Internal Medicine | Admitting: Internal Medicine

## 2016-05-12 ENCOUNTER — Other Ambulatory Visit (HOSPITAL_COMMUNITY): Payer: Self-pay | Admitting: Internal Medicine

## 2016-05-12 DIAGNOSIS — R922 Inconclusive mammogram: Secondary | ICD-10-CM | POA: Diagnosis not present

## 2016-05-12 DIAGNOSIS — N6311 Unspecified lump in the right breast, upper outer quadrant: Secondary | ICD-10-CM | POA: Diagnosis not present

## 2016-05-12 DIAGNOSIS — N6310 Unspecified lump in the right breast, unspecified quadrant: Secondary | ICD-10-CM | POA: Insufficient documentation

## 2016-05-12 DIAGNOSIS — N6489 Other specified disorders of breast: Secondary | ICD-10-CM

## 2016-05-19 ENCOUNTER — Ambulatory Visit (HOSPITAL_COMMUNITY)
Admission: RE | Admit: 2016-05-19 | Discharge: 2016-05-19 | Disposition: A | Discharge status: Home / Self Care | Payer: Medicare Other | Source: Ambulatory Visit | Attending: Internal Medicine | Admitting: Internal Medicine

## 2016-05-19 ENCOUNTER — Encounter (HOSPITAL_COMMUNITY): Payer: Self-pay

## 2016-05-19 ENCOUNTER — Other Ambulatory Visit (HOSPITAL_COMMUNITY): Payer: Self-pay | Admitting: Internal Medicine

## 2016-05-19 DIAGNOSIS — N6310 Unspecified lump in the right breast, unspecified quadrant: Secondary | ICD-10-CM | POA: Diagnosis present

## 2016-05-19 DIAGNOSIS — C50911 Malignant neoplasm of unspecified site of right female breast: Secondary | ICD-10-CM | POA: Insufficient documentation

## 2016-05-19 DIAGNOSIS — N6489 Other specified disorders of breast: Secondary | ICD-10-CM | POA: Diagnosis not present

## 2016-05-19 DIAGNOSIS — N6311 Unspecified lump in the right breast, upper outer quadrant: Secondary | ICD-10-CM | POA: Diagnosis not present

## 2016-05-19 DIAGNOSIS — R229 Localized swelling, mass and lump, unspecified: Secondary | ICD-10-CM

## 2016-05-19 DIAGNOSIS — D0501 Lobular carcinoma in situ of right breast: Secondary | ICD-10-CM | POA: Insufficient documentation

## 2016-05-19 DIAGNOSIS — IMO0002 Reserved for concepts with insufficient information to code with codable children: Secondary | ICD-10-CM

## 2016-05-19 DIAGNOSIS — C50919 Malignant neoplasm of unspecified site of unspecified female breast: Secondary | ICD-10-CM

## 2016-05-19 DIAGNOSIS — C50411 Malignant neoplasm of upper-outer quadrant of right female breast: Secondary | ICD-10-CM | POA: Diagnosis not present

## 2016-05-19 HISTORY — DX: Malignant neoplasm of unspecified site of unspecified female breast: C50.919

## 2016-05-19 MED ORDER — LIDOCAINE-EPINEPHRINE (PF) 1 %-1:200000 IJ SOLN
INTRAMUSCULAR | Status: AC
  Filled 2016-05-19: qty 30

## 2016-05-19 MED ORDER — LIDOCAINE HCL (PF) 1 % IJ SOLN
INTRAMUSCULAR | Status: AC
  Filled 2016-05-19: qty 5

## 2016-06-04 ENCOUNTER — Other Ambulatory Visit (HOSPITAL_COMMUNITY): Payer: Self-pay | Admitting: General Surgery

## 2016-06-04 DIAGNOSIS — Z96642 Presence of left artificial hip joint: Secondary | ICD-10-CM | POA: Diagnosis not present

## 2016-06-04 DIAGNOSIS — C50411 Malignant neoplasm of upper-outer quadrant of right female breast: Secondary | ICD-10-CM | POA: Diagnosis not present

## 2016-06-04 DIAGNOSIS — C50911 Malignant neoplasm of unspecified site of right female breast: Secondary | ICD-10-CM

## 2016-06-04 DIAGNOSIS — I10 Essential (primary) hypertension: Secondary | ICD-10-CM | POA: Diagnosis not present

## 2016-06-08 ENCOUNTER — Ambulatory Visit (HOSPITAL_COMMUNITY)
Admission: RE | Admit: 2016-06-08 | Discharge: 2016-06-08 | Disposition: A | Discharge status: Home / Self Care | Payer: Medicare Other | Source: Ambulatory Visit | Attending: General Surgery | Admitting: General Surgery

## 2016-06-08 DIAGNOSIS — K449 Diaphragmatic hernia without obstruction or gangrene: Secondary | ICD-10-CM | POA: Insufficient documentation

## 2016-06-08 DIAGNOSIS — C50411 Malignant neoplasm of upper-outer quadrant of right female breast: Secondary | ICD-10-CM | POA: Insufficient documentation

## 2016-06-08 DIAGNOSIS — C50911 Malignant neoplasm of unspecified site of right female breast: Secondary | ICD-10-CM

## 2016-06-08 LAB — POCT I-STAT CREATININE: Creatinine, Ser: 0.8 mg/dL (ref 0.44–1.00)

## 2016-06-08 MED ORDER — GADOBENATE DIMEGLUMINE 529 MG/ML IV SOLN
10.0000 mL | Freq: Once | INTRAVENOUS | Status: AC | PRN
  Administered 2016-06-08: 10 mL via INTRAVENOUS

## 2016-06-09 ENCOUNTER — Telehealth: Payer: Self-pay | Admitting: Oncology

## 2016-06-09 ENCOUNTER — Ambulatory Visit (HOSPITAL_COMMUNITY)
Admission: RE | Admit: 2016-06-09 | Discharge: 2016-06-09 | Disposition: A | Discharge status: Home / Self Care | Payer: Medicare Other | Source: Ambulatory Visit | Attending: General Surgery | Admitting: General Surgery

## 2016-06-09 ENCOUNTER — Encounter: Payer: Self-pay | Admitting: Radiation Oncology

## 2016-06-09 NOTE — Telephone Encounter (Signed)
Appt has been scheduled with the pt's son to see Dr. Jana Hakim on 4/9 at 4pm. I offered the son an appt for next week but he says he lives in [place] and it'd be easier to have all of her appts on the same day. Voiced understanding.

## 2016-06-11 DIAGNOSIS — I10 Essential (primary) hypertension: Secondary | ICD-10-CM | POA: Diagnosis not present

## 2016-06-11 DIAGNOSIS — C50911 Malignant neoplasm of unspecified site of right female breast: Secondary | ICD-10-CM | POA: Diagnosis not present

## 2016-06-11 DIAGNOSIS — F419 Anxiety disorder, unspecified: Secondary | ICD-10-CM | POA: Diagnosis not present

## 2016-06-11 DIAGNOSIS — Z6822 Body mass index (BMI) 22.0-22.9, adult: Secondary | ICD-10-CM | POA: Diagnosis not present

## 2016-06-15 ENCOUNTER — Encounter: Payer: Self-pay | Admitting: Radiation Oncology

## 2016-06-15 NOTE — Progress Notes (Signed)
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 Ratio=1.51), Ki-67(5%)  Did patient present with symptoms (if so, please note symptoms) or was this found on screening mammography?: Routine screening,   Past/Anticipated interventions by surgeon, if any: Dr. Fanny Skates, MD MRI scheduled [name] 06/08/16, wating on that before scheduling surgery,  Appt 06/25/16 with Dr. Dalbert Batman, MD  Past/Anticipated interventions by medical oncology, if any: Chemotherapy : Dr. Jana Hakim, MD appt 06/22/16  Lymphedema issues, if any:  NO  Pain issues, if any:  Back pain, hip   SAFETY ISSUES: NO  Prior radiation? NO  Pacemaker/ICD? NO Is the patient on methotrexate? NO Current Complaints / other details: Anxiety, Married,  4 children,  HX  PVC's ,no HRT,   Mother died CHF and DM, Father died ruptured AAA,Maternal aunt breast cancer,Sisterr/mastectomy  May be due to leaking implants,  BP (!) 155/89 (BP Location: Left Arm, Patient Position: Sitting, Cuff Size: Normal)   Pulse 79   Temp 98.5 F (36.9 C) (Oral)   Resp 18   Ht 5' 5.5" (1.664 m)   Wt 130 lb 9.6 oz (59.2 kg)   BMI 21.40 kg/m   Wt Readings from Last 3 Encounters:  06/22/16 130 lb 9.6 oz (59.2 kg)  02/13/16 123 lb (55.8 kg)  10/13/15 119 lb (54 kg)   Rebecca Eaton, RN 06/15/2016,11:46 AM  Allergies:Avelox=anaphylaxis

## 2016-06-16 ENCOUNTER — Encounter: Payer: Self-pay | Admitting: Radiation Oncology

## 2016-06-22 ENCOUNTER — Ambulatory Visit (HOSPITAL_BASED_OUTPATIENT_CLINIC_OR_DEPARTMENT_OTHER): Payer: Medicare Other | Admitting: Oncology

## 2016-06-22 ENCOUNTER — Encounter: Payer: Self-pay | Admitting: Radiation Oncology

## 2016-06-22 ENCOUNTER — Ambulatory Visit
Admission: RE | Admit: 2016-06-22 | Discharge: 2016-06-22 | Disposition: A | Discharge status: Home / Self Care | Payer: Medicare Other | Source: Ambulatory Visit | Attending: Radiation Oncology | Admitting: Radiation Oncology

## 2016-06-22 ENCOUNTER — Telehealth: Payer: Self-pay | Admitting: Oncology

## 2016-06-22 VITALS — BP 155/89 | HR 79 | Temp 98.5°F | Resp 18 | Ht 65.5 in | Wt 130.6 lb

## 2016-06-22 DIAGNOSIS — Z888 Allergy status to other drugs, medicaments and biological substances status: Secondary | ICD-10-CM | POA: Diagnosis not present

## 2016-06-22 DIAGNOSIS — Z79899 Other long term (current) drug therapy: Secondary | ICD-10-CM | POA: Insufficient documentation

## 2016-06-22 DIAGNOSIS — D649 Anemia, unspecified: Secondary | ICD-10-CM | POA: Insufficient documentation

## 2016-06-22 DIAGNOSIS — Z79810 Long term (current) use of selective estrogen receptor modulators (SERMs): Secondary | ICD-10-CM | POA: Diagnosis not present

## 2016-06-22 DIAGNOSIS — Z9889 Other specified postprocedural states: Secondary | ICD-10-CM | POA: Insufficient documentation

## 2016-06-22 DIAGNOSIS — M1990 Unspecified osteoarthritis, unspecified site: Secondary | ICD-10-CM | POA: Insufficient documentation

## 2016-06-22 DIAGNOSIS — G8929 Other chronic pain: Secondary | ICD-10-CM | POA: Insufficient documentation

## 2016-06-22 DIAGNOSIS — J45909 Unspecified asthma, uncomplicated: Secondary | ICD-10-CM | POA: Insufficient documentation

## 2016-06-22 DIAGNOSIS — Z833 Family history of diabetes mellitus: Secondary | ICD-10-CM | POA: Diagnosis not present

## 2016-06-22 DIAGNOSIS — M549 Dorsalgia, unspecified: Secondary | ICD-10-CM | POA: Insufficient documentation

## 2016-06-22 DIAGNOSIS — F419 Anxiety disorder, unspecified: Secondary | ICD-10-CM | POA: Diagnosis not present

## 2016-06-22 DIAGNOSIS — Z510 Encounter for antineoplastic radiation therapy: Secondary | ICD-10-CM | POA: Insufficient documentation

## 2016-06-22 DIAGNOSIS — Z170 Estrogen receptor positive status [ER+]: Secondary | ICD-10-CM

## 2016-06-22 DIAGNOSIS — Z86010 Personal history of colonic polyps: Secondary | ICD-10-CM | POA: Diagnosis not present

## 2016-06-22 DIAGNOSIS — Z803 Family history of malignant neoplasm of breast: Secondary | ICD-10-CM | POA: Diagnosis not present

## 2016-06-22 DIAGNOSIS — M8580 Other specified disorders of bone density and structure, unspecified site: Secondary | ICD-10-CM

## 2016-06-22 DIAGNOSIS — K219 Gastro-esophageal reflux disease without esophagitis: Secondary | ICD-10-CM | POA: Insufficient documentation

## 2016-06-22 DIAGNOSIS — C50411 Malignant neoplasm of upper-outer quadrant of right female breast: Secondary | ICD-10-CM | POA: Insufficient documentation

## 2016-06-22 DIAGNOSIS — H9190 Unspecified hearing loss, unspecified ear: Secondary | ICD-10-CM | POA: Insufficient documentation

## 2016-06-22 DIAGNOSIS — Z8249 Family history of ischemic heart disease and other diseases of the circulatory system: Secondary | ICD-10-CM | POA: Diagnosis not present

## 2016-06-22 DIAGNOSIS — F329 Major depressive disorder, single episode, unspecified: Secondary | ICD-10-CM | POA: Diagnosis not present

## 2016-06-22 DIAGNOSIS — K5790 Diverticulosis of intestine, part unspecified, without perforation or abscess without bleeding: Secondary | ICD-10-CM | POA: Insufficient documentation

## 2016-06-22 HISTORY — DX: Malignant neoplasm of unspecified site of unspecified female breast: C50.919

## 2016-06-22 NOTE — Progress Notes (Signed)
Please see the Nurse Progress Note in the MD Initial Consult Encounter for this patient. 

## 2016-06-22 NOTE — Progress Notes (Signed)
[place]  [phone] [phone]     ID: Marilyn Haas DOB: 07-27-1940  MR#: [id number]  YIF#:[id number]  Patient Care Team: Redmond School, MD as PCP - General (Internal Medicine) Chauncey Cruel, MD as Consulting Physician (Oncology) Fanny Skates, MD as Consulting Physician (General Surgery) Kyung Rudd, MD as Consulting Physician (Radiation Oncology) Rogene Houston, MD as Consulting Physician (Gastroenterology) Gaynelle Arabian, MD as Consulting Physician (Orthopedic Surgery) Chauncey Cruel, MD OTHER MD:  CHIEF COMPLAINT: Estrogen receptor positive lobular breast cancer  CURRENT TREATMENT: Awaiting definitive surgery   BREAST CANCER HISTORY: The patient had bilateral screening mammography at the [facility] 04/22/2016 showing an area of possible asymmetry and calcifications in the right breast. Right diagnostic mammography with tomography and right breast ultrasonography 05/12/2016 showed the breast density to be category C. In the right breast upper outer quadrant there was a. A real lower area of architectural distortion which was palpable in the 11:00 radians 1 cm from the nipple. Ultrasonography confirmed an irregular hypoechoic mass at this location measuring 1.4 cm. Ultrasound of the right axilla was unremarkable.  Biopsy of the right breast mass in question 05/19/2016 showed ([id number]) and invasive lobular carcinoma, E-cadherin negative, estrogen receptor 90% positive, progesterone receptor 15% positive, both with strong staining intensity, with an MIB-1 of 5%, and no HER-2 amplification, the signals ratio being 1.51 and the number per cell 3.10.  Breast MRI 06/08/2016 confirmed an irregular enhancing mass in the upper-outer quadrant of the right breast measuring 1.5 cm, with adjacent biopsy clip artifact. There was an area of contiguous non-mass like enhancement bringing the total dimension to 1.9 cm. However there was no  evidence of multicentric disease and no evidence of metastatic lymphadenopathy.  The patient's subsequent history is as detailed below   INTERVAL HISTORY: The patient was evaluated in the breast clinic 06/22/2016 accompanied by her son Marilyn Haas. Her case was also presented in the multidisciplinary breast cancer conference 06/17/2016. At that time a preliminary plan was proposed: Breast conserving surgery with sentinel lymph node sampling, hormones (no chemotherapy, no Oncotype), with consideration of radiation depending on final surgical results  REVIEW OF SYSTEMS: There were no specific symptoms leading to the original mammogram, which was routinely scheduled. The patient denies unusual headaches, visual changes, nausea, vomiting, stiff neck, dizziness, or gait imbalance. There has been no cough, phlegm production, or pleurisy, no chest pain or pressure, and no change in bowel or bladder habits. The patient denies fever, rash, bleeding, unexplained fatigue or unexplained weight loss. She does not exercise regularly but is very active in [employer]. A detailed review of systems was otherwise entirely negative.   PAST MEDICAL HISTORY: Past Medical History:  Diagnosis Date  . Allergy    avelox -anaphylaxis  . Anemia   . Anxiety   . Arthritis   . Breast cancer ([facility]) 05/19/2016   Right breast  . Childhood asthma   . Colonic polyp    Adenomatous polyp. No high grade dysplasia or invasive malignancy. 07/06/2006  . Depression   . Dysphagia   . Esophageal stricture   . Essential hypertension   . GERD (gastroesophageal reflux disease)   . Hiatal hernia   . History of blood transfusion   . History of pneumonia   . Hyperlipidemia   . Impaired hearing   . Irritable bowel syndrome   . PVC's (premature ventricular contractions)   . Scarlet fever     PAST SURGICAL HISTORY: Past Surgical History:  Procedure Laterality  Date  . Colonscopy    . 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 07/04/2014   Procedure: LEFT  CONVERSION TO TOTAL HIP ARTHROPLASTY;  Surgeon: Gaynelle Arabian, MD;  Location: [facility];  Service: Orthopedics;  Laterality: Left;  . ESOPHAGOGASTRODUODENOSCOPY N/A 01/26/2014   Procedure: ESOPHAGOGASTRODUODENOSCOPY (EGD);  Surgeon: Rogene Houston, MD;  Location: [facility];  Service: Endoscopy;  Laterality: N/A;  1030  . HIP FRACTURE SURGERY     05/2009 left hip   . MALONEY DILATION N/A 01/26/2014   Procedure: Venia Minks DILATION;  Surgeon: Rogene Houston, MD;  Location: [facility];  Service: Endoscopy;  Laterality: N/A;    FAMILY HISTORY Family History  Problem Relation Age of Onset  . Heart failure Mother   . Diabetes Mother   . Hypertension Mother   . Hypertension Sister   . Heart disease Sister     Premature CAD  . Cancer Maternal Aunt     breast  The patient's father died at age 28 from rupture of an abdominal aortic aneurysm. The patient's mother died with heart failure in the setting of diabetes at age 71. The patient had no brothers, 2 sisters. One sister was diagnosed with breast cancer at age 11. One maternal aunt was diagnosed with breast cancer in her 80s  GYNECOLOGIC HISTORY:  No LMP recorded. Patient is postmenopausal. Menarche age 76, first live birth age 79, which the patient is aware increases the risk of breast cancer. She is GX P3. She does not quite recall when she went through the change of life but thinks he was in her 70s. She did not use hormone replacement.  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 Her husband"Marilyn" Haas, 83, was in sales. She is his primary caregiver. There has been some mental decline recently. Son Marilyn Haas lives in [place] where he works in Publishing rights manager. Son Marilyn Haas lives in [place] and works in Forensic scientist. Son Marilyn Haas lives in [place] and works as a Dealer. The patient has 12 grandchildren and 2 great-grandchildren. She is a Psychologist, forensic.     ADVANCED DIRECTIVES: Not addressed at 06/22/2016 meeting   HEALTH  MAINTENANCE: Social History  Substance Use Topics  . Smoking status: Former Smoker    Packs/day: 0.50    Years: 2.00    Types: Cigarettes    Quit date: 01/15/1961  . Smokeless tobacco: Never Used  . Alcohol use 0.0 oz/week     Comment: ETOH abuse years ago, occ drinks now     Colonoscopy: [facility]  PAP: remote  Bone density: 04/22/2016   Allergies  Allergen Reactions  . Avelox [Moxifloxacin Hcl In Nacl] Other (See Comments)    "go crazy"    Current Outpatient Prescriptions  Medication Sig Dispense Refill  . ALPRAZolam (XANAX) 0.5 MG tablet Take 0.5 mg by mouth at bedtime as needed for anxiety.    . DULoxetine (CYMBALTA) 20 MG capsule Take 20 mg by mouth daily. Takes 2 tabs daily     No current facility-administered medications for this visit.     OBJECTIVE: Older white woman in no acute distress Vitals:   06/22/16 1640  BP: (!) 155/89  Pulse: 79  Resp: 18  Temp: 98.5 F (36.9 C)     Body mass index is 21.3 kg/m.    ECOG FS:0 - Asymptomatic  Ocular: Sclerae unicteric, pupils equal, round and reactive to light Ear-nose-throat: Oropharynx clear and moist Lymphatic: No cervical or supraclavicular adenopathy Lungs no rales or rhonchi, good excursion  bilaterally Heart regular rate and rhythm, no murmur appreciated Abd soft, nontender, positive bowel sounds MSK no focal spinal tenderness, no joint edema Neuro: non-focal, well-oriented, appropriate affect Breasts: There is a small mass measuring between 1 and 2 cm just superior to the nipple in the right breast, not associated with nipple retraction erythema or swelling. I do not palpate any other masses in either breast. Both axillae are benign.   LAB RESULTS:  CMP     Component Value Date/Time   NA 135 10/13/2015 0628   K 3.8 10/13/2015 0628   CL 106 10/13/2015 0628   CO2 26 10/13/2015 0628   GLUCOSE 83 10/13/2015 0628   BUN 9 10/13/2015 0628   CREATININE 0.80 06/08/2016 1605   CREATININE 0.77 12/13/2014  1132   CALCIUM 8.4 (L) 10/13/2015 0628   PROT 7.1 07/31/2015 1932   PROT 7.2 01/23/2015 1233   ALBUMIN 4.1 07/31/2015 1932   ALBUMIN 4.5 01/23/2015 1233   AST 20 07/31/2015 1932   ALT 14 07/31/2015 1932   ALKPHOS 69 07/31/2015 1932   BILITOT 0.3 07/31/2015 1932   BILITOT 0.4 01/23/2015 1233   GFRNONAA >60 10/13/2015 0628   GFRAA >60 10/13/2015 0628    No results found for: TOTALPROTELP, ALBUMINELP, A1GS, A2GS, BETS, BETA2SER, GAMS, MSPIKE, SPEI  No results found for: Nils Pyle, [facility]  Lab Results  Component Value Date   WBC 6.6 10/12/2015   NEUTROABS 4.0 10/11/2015   HGB 9.0 (L) 10/12/2015   HCT 26.8 (L) 10/12/2015   MCV 92.1 10/12/2015   PLT 226 10/12/2015      Chemistry      Component Value Date/Time   NA 135 10/13/2015 0628   K 3.8 10/13/2015 0628   CL 106 10/13/2015 0628   CO2 26 10/13/2015 0628   BUN 9 10/13/2015 0628   CREATININE 0.80 06/08/2016 1605   CREATININE 0.77 12/13/2014 1132      Component Value Date/Time   CALCIUM 8.4 (L) 10/13/2015 0628   ALKPHOS 69 07/31/2015 1932   AST 20 07/31/2015 1932   ALT 14 07/31/2015 1932   BILITOT 0.3 07/31/2015 1932   BILITOT 0.4 01/23/2015 1233       No results found for: LABCA2  No components found for: [id number]  No results for input(s): INR in the last 168 hours.  Urinalysis    Component Value Date/Time   COLORURINE YELLOW 10/11/2015 [place] 10/11/2015 1211   LABSPEC <1.005 (L) 10/11/2015 1211   PHURINE 6.5 10/11/2015 1211   GLUCOSEU 100 (A) 10/11/2015 1211   HGBUR TRACE (A) 10/11/2015 1211   BILIRUBINUR NEGATIVE 10/11/2015 1211   KETONESUR NEGATIVE 10/11/2015 1211   PROTEINUR NEGATIVE 10/11/2015 1211   UROBILINOGEN 1.0 06/26/2014 1400   NITRITE NEGATIVE 10/11/2015 1211   LEUKOCYTESUR NEGATIVE 10/11/2015 1211     STUDIES: Mr Breast Bilateral W Wo Contrast  Result Date: 06/09/2016 CLINICAL DATA:  Lobular breast cancer, right. LABS:  Not applicable EXAM:  BILATERAL BREAST MRI WITH AND WITHOUT CONTRAST TECHNIQUE: Multiplanar, multisequence MR images of both breasts were obtained prior to and following the intravenous administration of 10 ml of MultiHance. THREE-DIMENSIONAL MR IMAGE RENDERING ON INDEPENDENT WORKSTATION: Three-dimensional MR images were rendered by post-processing of the original MR data on an independent workstation. The three-dimensional MR images were interpreted, and findings are reported in the following complete MRI report for this study. Three dimensional images were evaluated at the independent DynaCad workstation COMPARISON:  Previous exam(s). FINDINGS: Study is slightly limited by  patient motion artifact and streak artifact. Breast composition: c. Heterogeneous fibroglandular tissue. Background parenchymal enhancement: Mild Right breast: There is an irregular enhancing mass within the upper-outer quadrant of the right breast, 11 o'clock axis region, measuring 1.5 x 1.4 x 1.5 cm (AP by transverse by craniocaudal) (series 100, image 76), with adjacent biopsy clip artifact, consistent with biopsy proven invasive carcinoma, with additional contiguous non-mass enhancement at the inferolateral aspect of the mass which extends the greatest dimension to 1.9 cm (oblique) (series 100, images 79 through 83). There are no additional suspicious masses, non-mass enhancement or secondary signs of malignancy within the right breast. Left breast: No mass or abnormal enhancement. Lymph nodes: No abnormal appearing lymph nodes. Ancillary findings:  Large hiatal hernia. IMPRESSION: 1. Biopsy-proven invasive carcinoma within the upper-outer quadrant of the RIGHT breast, 11 o'clock axis, measuring 1.5 x 1.4 x 1.5 cm, with additional contiguous non-mass enhancement at the inferolateral aspect of the mass increasing the greatest dimension to 1.9 cm (oblique). 2. No evidence of multicentric disease within the right breast. 3. No evidence of malignancy within the left  breast. 4. No evidence of metastatic lymphadenopathy. 5. Large hiatal hernia. RECOMMENDATION: Per current treatment plan for patient's biopsy-proven right breast cancer. BI-RADS CATEGORY  6: Known biopsy-proven malignancy. Electronically Signed   By: Franki Cabot M.D.   On: 06/09/2016 15:30    ELIGIBLE FOR AVAILABLE RESEARCH PROTOCOL: no  ASSESSMENT: 76 y.o. [place] woman status post right breast upper outer quadrant biopsy 05/19/2016 for a clinical T1c N0, stage IA invasive lobular breast cancer, estrogen and progesterone receptor positive, HER-2 nonamplified, with an MIB-15%.   (1) definitive surgery pending  (2) adjuvant radiation to follow  (3) anti-estrogens most likely tamoxifen to follow the completion of local treatment  (a) bone density 04/22/2016 shows a T score in the right femur of -2.0, right radius -2.4   PLAN: We spent the better part of today's hour-long appointment discussing the biology of breast cancer in general, and the specifics of the patient's tumor in particular. She and her son understand that lobular breast cancer can be difficult to palpate and therefore positive margins are more common in this setting then with ductal breast cancers. These cancers are also difficult to image on routine mammography. The MRI however was reassuring.  We discussed the difference between local and systemic therapy. In terms of loco-regional treatment, lumpectomy plus radiation is equivalent to mastectomy as far as survival is concerned. For this reason, and because the cosmetic results are generally superior, we recommend breast conserving surgery.   We then discussed the rationale for systemic therapy. There is some risk that this cancer may have already spread to other parts of her body. Patients frequently ask at this point about bone scans, CAT scans and PET scans to find out if they have occult breast cancer somewhere else. The problem is that in early stage disease we are much  more likely to find false positives then true cancers and this would expose the patient to unnecessary procedures as well as unnecessary radiation. Scans cannot answer the question the patient really would like to know, which is whether she has microscopic disease elsewhere in her body. For those reasons we do not recommend them.  Of course we would proceed to aggressive evaluation of any symptoms that might suggest metastatic disease, but that is not the case here.  Next we went over the options for systemic therapy which are anti-estrogens, anti-HER-2 immunotherapy, and chemotherapy. [place] does not meet  criteria for anti-HER-2 immunotherapy. She is a good candidate for anti-estrogens.  The question of chemotherapy is more complicated. Chemotherapy is most effective in rapidly growing, aggressive tumors. It is much less effective in not very aggressive slow growing cancers, like [place] 's. For that reason and taking into account the patient's age and the lobular subtype of tumor we decided to forgo Oncotype testing as suggested by an CCN guidelines and limited systemic therapy to anti-estrogens alone  The plan then will be to start with definitive surgery which will be lumpectomy with sentinel lymph node sampling, and most likely proceed with radiation, and then very likely moved to tamoxifen given the patient's significant osteopenia  [place] has a good understanding of the overall plan. She agrees with it. She knows the goal of treatment in her case is cure. She will call with any problems that may develop before her next visit here.  Chauncey Cruel, MD   06/22/2016 5:34 PM Medical Oncology and Hematology [facility] [address] [place], [place] [address] Tel. [phone]    Fax. [phone]

## 2016-06-22 NOTE — Progress Notes (Signed)
Radiation Oncology         [phone] ________________________________  Name: Marilyn Haas MRN: [id number]  Date: 06/22/2016  DOB: 04/10/1940  PR:FFMBWGY, Betsy Coder, MD  Fanny Skates, MD     REFERRING PHYSICIAN: Fanny Skates, MD   DIAGNOSIS: The encounter diagnosis was Malignant neoplasm of upper-outer quadrant of right breast in female, estrogen receptor positive ([place]).   HISTORY OF PRESENT ILLNESS: Marilyn Haas is a 76 y.o. female seen at the request of Dr. Dalbert Batman. She presented with an abnormal screening mammogram on 04/23/16. Ultrasound on 05/12/16 revealed a 1.4 cm mass at the 11 o'clock position, 1 cm from the nipple in the right breast. 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 Receptor status was ER 90%, PR 15%, Her2-, and Ki67 5%. Bilateral breast MRI on 06/08/16 showed invasive carcinoma in the upper-outer quadrant of the right breast measuring 1.5 x 1.4 x 1.5 cm with additional contiguous non-mass enhancement at the inferolateral aspect of the mass increasing the greatest dimension to 1.9 cm. There was no evidence of malignancy within the left breast. Patient will be seen by Dr. Jana Hakim following today's appointment. She also has an appointment with Dr. Dalbert Batman on 06/25/16 to discuss surgical planning.  PREVIOUS RADIATION THERAPY: No   PAST MEDICAL HISTORY:  Past Medical History:  Diagnosis Date  . Allergy    avelox -anaphylaxis  . Anemia   . Anxiety   . Arthritis   . Breast cancer ([place]) 05/19/2016   Right breast  . Childhood asthma   . Colonic polyp    Adenomatous polyp. No high grade dysplasia or invasive malignancy. 07/06/2006  . Depression   . Dysphagia   . Esophageal stricture   . Essential hypertension   . GERD (gastroesophageal reflux disease)   . Hiatal hernia   . History of blood transfusion   . History of pneumonia   . Hyperlipidemia   . Impaired hearing   . Irritable bowel syndrome   . PVC's (premature  ventricular contractions)   . Scarlet fever        PAST SURGICAL HISTORY: Past Surgical History:  Procedure Laterality Date  . Colonscopy    . 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 ESOPHAGOGASTRODUODENOSCOPY N/A 01/26/2014   Procedure: ESOPHAGOGASTRODUODENOSCOPY (EGD);  Surgeon: Rogene Houston, MD;  [facility];  Service: Endoscopy;  Laterality: N/A;  1030  . HIP FRACTURE SURGERY     05/2009 left hip   . MALONEY DILATION N/A 01/26/2014   Procedure: Venia Minks DILATION;  Surgeon: Rogene Houston, MD;  [facility]: [facility];  Service: Endoscopy;  Laterality: N/A;     FAMILY HISTORY:  Family History  Problem Relation Age of Onset  . Heart failure Mother   . Diabetes Mother   . Hypertension Mother   . Hypertension Sister   . Heart disease Sister     Premature CAD  . Cancer Maternal Aunt     breast     SOCIAL HISTORY:  reports that she quit smoking about 55 years ago. Her smoking use included Cigarettes. She has a 1.00 pack-year smoking history. She has never used smokeless tobacco. She reports that she drinks alcohol. She reports that she does not use drugs.  The patient is married and lives in [place]. Her husband has dementia, and she is accompanied by  her son for today's visit.   ALLERGIES: Avelox [moxifloxacin hcl in nacl]   MEDICATIONS:  Current Outpatient Prescriptions  Medication Sig Dispense Refill  . ALPRAZolam (XANAX) 0.5 MG tablet Take 0.5 mg by mouth at bedtime as needed for anxiety.    . DULoxetine (CYMBALTA) 20 MG capsule Take 20 mg by mouth daily. Takes 2 tabs daily    . HYDROcodone-acetaminophen (NORCO) 10-325 MG tablet Take 1 tablet by mouth every 6 (six) hours as needed.  0   No current facility-administered medications for this encounter.      REVIEW OF SYSTEMS: On review of systems, the  patient reports that she is doing well overall. She denies any chest pain, shortness of breath, cough, fevers, chills, night sweats, unintended weight changes. She denies any bowel or bladder disturbances, and denies abdominal pain, nausea or vomiting. She complains of chronic back and hip pain. She denies any new musculoskeletal or joint aches or pains. A complete review of systems is obtained and is otherwise negative.     PHYSICAL EXAM:  Wt Readings from Last 3 Encounters:  02/13/16 123 lb (55.8 kg)  10/13/15 119 lb (54 kg)  10/03/15 110 lb (49.9 kg)   Temp Readings from Last 3 Encounters:  05/19/16 98 F (36.7 C) (Oral)  10/13/15 99 F (37.2 C) (Oral)  10/03/15 97.9 F (36.6 C) (Oral)   BP Readings from Last 3 Encounters:  05/19/16 (!) 151/73  02/13/16 (!) 156/84  10/13/15 (!) 145/74   Pulse Readings from Last 3 Encounters:  05/19/16 72  02/13/16 82  10/13/15 79      In general this is a well appearing caucasian female in no acute distress. She's alert and oriented x4 and appropriate throughout the examination. Cardiopulmonary assessment is negative for acute distress and she exhibits normal effort. Breast exam is deferred at the patient's request until after surgery.    ECOG = 0  0 - Asymptomatic (Fully active, able to carry on all predisease activities without restriction)  1 - Symptomatic but completely ambulatory (Restricted in physically strenuous activity but ambulatory and able to carry out work of a light or sedentary nature. For example, light housework, office work)  2 - Symptomatic, <50% in bed during the day (Ambulatory and capable of all self care but unable to carry out any work activities. Up and about more than 50% of waking hours)  3 - Symptomatic, >50% in bed, but not bedbound (Capable of only limited self-care, confined to bed or chair 50% or more of waking hours)  4 - Bedbound (Completely disabled. Cannot carry on any self-care. Totally confined to  bed or chair)  5 - Death   Eustace Pen MM, Creech RH, Tormey DC, et al. [phone]). "Toxicity and response criteria of the [facility]". [place] Oncol. 5 (6): 649-55    LABORATORY DATA:  Lab Results  Component Value Date   WBC 6.6 10/12/2015   HGB 9.0 (L) 10/12/2015   HCT 26.8 (L) 10/12/2015   MCV 92.1 10/12/2015   PLT 226 10/12/2015   Lab Results  Component Value Date   NA 135 10/13/2015   K 3.8 10/13/2015   CL 106 10/13/2015   CO2 26 10/13/2015   Lab Results  Component Value Date   ALT 14 07/31/2015   AST 20 07/31/2015   ALKPHOS 69 07/31/2015   BILITOT 0.3 07/31/2015      RADIOGRAPHY: Mr Breast Bilateral W Wo Contrast  Result Date: 06/09/2016 CLINICAL DATA:  Lobular  breast cancer, right. LABS:  Not applicable EXAM: BILATERAL BREAST MRI WITH AND WITHOUT CONTRAST TECHNIQUE: Multiplanar, multisequence MR images of both breasts were obtained prior to and following the intravenous administration of 10 ml of MultiHance. THREE-DIMENSIONAL MR IMAGE RENDERING ON INDEPENDENT WORKSTATION: Three-dimensional MR images were rendered by post-processing of the original MR data on an independent workstation. The three-dimensional MR images were interpreted, and findings are reported in the following complete MRI report for this study. Three dimensional images were evaluated at the independent DynaCad workstation COMPARISON:  Previous exam(s). FINDINGS: Study is slightly limited by patient motion artifact and streak artifact. Breast composition: c. Heterogeneous fibroglandular tissue. Background parenchymal enhancement: Mild Right breast: There is an irregular enhancing mass within the upper-outer quadrant of the right breast, 11 o'clock axis region, measuring 1.5 x 1.4 x 1.5 cm (AP by transverse by craniocaudal) (series 100, image 76), with adjacent biopsy clip artifact, consistent with biopsy proven invasive carcinoma, with additional contiguous non-mass enhancement at the  inferolateral aspect of the mass which extends the greatest dimension to 1.9 cm (oblique) (series 100, images 79 through 83). There are no additional suspicious masses, non-mass enhancement or secondary signs of malignancy within the right breast. Left breast: No mass or abnormal enhancement. Lymph nodes: No abnormal appearing lymph nodes. Ancillary findings:  Large hiatal hernia. IMPRESSION: 1. Biopsy-proven invasive carcinoma within the upper-outer quadrant of the RIGHT breast, 11 o'clock axis, measuring 1.5 x 1.4 x 1.5 cm, with additional contiguous non-mass enhancement at the inferolateral aspect of the mass increasing the greatest dimension to 1.9 cm (oblique). 2. No evidence of multicentric disease within the right breast. 3. No evidence of malignancy within the left breast. 4. No evidence of metastatic lymphadenopathy. 5. Large hiatal hernia. RECOMMENDATION: Per current treatment plan for patient's biopsy-proven right breast cancer. BI-RADS CATEGORY  6: Known biopsy-proven malignancy. Electronically Signed   By: Franki Cabot M.D.   On: 06/09/2016 15:30       IMPRESSION/PLAN: 1. Stage IA, [id number] ER/PR positive, invasive lobular carcinoma of the right breast. Dr. Lisbeth Renshaw discusses the pathology findings and reviews the nature of invasive lobular disease. The patient will be returning to see Dr. Leonel Ramsay later this week and will detail surgical approach. We anticipate that he will offer the patient breast conservation with lumpectomy and sentinel node evaluation. We also discussed that Dr. Jana Hakim will be reviewing her case today to discuss antiestrogen therapy as well as whether there is a role for chemotherapy. Provided that chemotherapy is not indicated, the patient's course would then be followed by external radiotherapy to the breast followed by antiestrogen therapy. We discussed the risks, benefits, short, and long term effects of radiotherapy, and the patient is interested in proceeding. Dr. Lisbeth Renshaw  discusses the delivery and logistics of radiotherapy, and anticipates a course of 4-6 1/2 weeks of treatment, most likely 4 weeks with the information we have currently. We will see her back about 2 weeks after surgery to move forward with the simulation and planning process and anticipate starting radiotherapy about 4 weeks after surgery.   In a visit lasting 60 minutes, greater than 50% of the time was spent face to face discussing options for treatment, and coordinating the patient's care.  The above documentation reflects my direct findings during this shared patient visit. Please see the separate note by Dr. Lisbeth Renshaw on this date for the remainder of the patient's plan of care.    Carola Rhine, PAC  This document serves as a record of  services personally performed by Kyung Rudd, MD and Shona Simpson, PA-C. It was created on their behalf by Bethann Humble, a trained medical scribe. The creation of this record is based on the scribe's personal observations and the provider's statements to them. This document has been checked and approved by the attending provider.

## 2016-06-22 NOTE — Telephone Encounter (Signed)
Left message for patient re June appointment. Schedule mailed.

## 2016-06-23 ENCOUNTER — Telehealth: Payer: Self-pay | Admitting: *Deleted

## 2016-06-23 ENCOUNTER — Emergency Department (HOSPITAL_COMMUNITY): Payer: Medicare Other

## 2016-06-23 ENCOUNTER — Encounter (HOSPITAL_COMMUNITY): Payer: Self-pay | Admitting: Emergency Medicine

## 2016-06-23 ENCOUNTER — Emergency Department (HOSPITAL_COMMUNITY)
Admission: EM | Admit: 2016-06-23 | Discharge: 2016-06-24 | Disposition: A | Discharge status: Home / Self Care | Payer: Medicare Other | Attending: Emergency Medicine | Admitting: Emergency Medicine

## 2016-06-23 DIAGNOSIS — S0003XA Contusion of scalp, initial encounter: Secondary | ICD-10-CM | POA: Diagnosis not present

## 2016-06-23 DIAGNOSIS — J45909 Unspecified asthma, uncomplicated: Secondary | ICD-10-CM | POA: Insufficient documentation

## 2016-06-23 DIAGNOSIS — W1800XA Striking against unspecified object with subsequent fall, initial encounter: Secondary | ICD-10-CM | POA: Insufficient documentation

## 2016-06-23 DIAGNOSIS — I10 Essential (primary) hypertension: Secondary | ICD-10-CM | POA: Diagnosis not present

## 2016-06-23 DIAGNOSIS — Y92009 Unspecified place in unspecified non-institutional (private) residence as the place of occurrence of the external cause: Secondary | ICD-10-CM | POA: Diagnosis not present

## 2016-06-23 DIAGNOSIS — Z87891 Personal history of nicotine dependence: Secondary | ICD-10-CM | POA: Diagnosis not present

## 2016-06-23 DIAGNOSIS — Z853 Personal history of malignant neoplasm of breast: Secondary | ICD-10-CM | POA: Insufficient documentation

## 2016-06-23 DIAGNOSIS — Z79899 Other long term (current) drug therapy: Secondary | ICD-10-CM | POA: Insufficient documentation

## 2016-06-23 DIAGNOSIS — Y999 Unspecified external cause status: Secondary | ICD-10-CM | POA: Insufficient documentation

## 2016-06-23 DIAGNOSIS — Y939 Activity, unspecified: Secondary | ICD-10-CM | POA: Diagnosis not present

## 2016-06-23 DIAGNOSIS — R51 Headache: Secondary | ICD-10-CM | POA: Diagnosis not present

## 2016-06-23 DIAGNOSIS — S0990XA Unspecified injury of head, initial encounter: Secondary | ICD-10-CM | POA: Diagnosis present

## 2016-06-23 NOTE — ED Triage Notes (Signed)
Pt states she got off balance while toting groceries in her house and fell striking the back of her head. Pt denies any loc.

## 2016-06-23 NOTE — ED Provider Notes (Signed)
By signing my name below, I, [name], attest that this documentation has been prepared under the direction and in the presence of Rolland Porter, MD. Electronically Signed: [name], ED Scribe. 06/24/16. 12:25 AM.  Time Seen: 23:58  HPI Comments: Marilyn Haas is a 76 y.o. female who presents to the Emergency Department complaining of sudden onset, constant, headache s/p mechanical fall that occurred earlier today. Pt states she was bringing in an arm full of groceries while walking up her residence steps, when she suddenly lost her balance, and fell backwards. She reports striking her head on the cement ground, but denies LOC. Per pt, she notes having a lot of swelling to her head. She reports using ice-packs with minimal relief. Pt denies numbness, neck pain, other extremity pain.  PCP Dr. Redmond School  PE:  Patient is alert and cooperative. Answers questions approprotaitely. She does have swelling of left posterior, superior scalp without laceration. She's moving her head freely during course of conversation without discomfort. Moving all extremities well.     Medical screening examination/treatment/procedure(s) were conducted as a shared visit with non-physician practitioner(s) and myself.  I personally evaluated the patient during the encounter.   I personally performed the services described in this documentation, which was scribed in my presence. The recorded information has been reviewed and considered.   Rolland Porter, MD, Barbette Or, MD 06/24/16 [phone]

## 2016-06-23 NOTE — ED Provider Notes (Signed)
[facility] DEPT Provider Note   CSN: [id number] Arrival date & time: 06/23/16  1914     History   Chief Complaint Chief Complaint  Patient presents with  . Fall    HPI Marilyn Haas is a 76 y.o. female.  The history is provided by the patient.  Fall  This is a new problem. The current episode started 3 to 5 hours ago. The problem has not changed since onset.Associated symptoms include headaches. Pertinent negatives include no chest pain, no abdominal pain and no shortness of breath. Nothing aggravates the symptoms. Nothing relieves the symptoms. She has tried a cold compress for the symptoms. The treatment provided mild relief.    Past Medical History:  Diagnosis Date  . Allergy    avelox -anaphylaxis  . Anemia   . Anxiety   . Arthritis   . Breast cancer ([place]) 05/19/2016   Right breast  . Childhood asthma   . Colonic polyp    Adenomatous polyp. No high grade dysplasia or invasive malignancy. 07/06/2006  . Depression   . Dysphagia   . Esophageal stricture   . Essential hypertension   . GERD (gastroesophageal reflux disease)   . Hiatal hernia   . History of blood transfusion   . History of pneumonia   . Hyperlipidemia   . Impaired hearing   . Irritable bowel syndrome   . PVC's (premature ventricular contractions)   . Scarlet fever     Patient Active Problem List   Diagnosis Date Noted  . Malignant neoplasm of upper-outer quadrant of right breast in female, estrogen receptor positive ([place]) 06/22/2016  . Osteopenia determined by x-ray 06/22/2016  . Hyponatremia 10/12/2015  . Syncope 10/11/2015  . Acute pancreatitis 10/03/2015  . Other fatigue 12/16/2014  . PVC's (premature ventricular contractions) 12/16/2014  . Pain with hip hemiarthroplasty ([place]) 07/04/2014  . Left hip pain 01/31/2013  . Anemia 08/09/2012  . B12 deficiency anemia 08/09/2012  . Vitamin D deficiency 08/09/2012  . Anxiety 08/09/2012  . Palpitations 08/09/2012  . Hyperlipidemia  05/25/2011  . Hypertension 05/25/2011  . Dysphagia 05/25/2011    Past Surgical History:  Procedure Laterality Date  . Colonscopy    . 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 ESOPHAGOGASTRODUODENOSCOPY N/A 01/26/2014   Procedure: ESOPHAGOGASTRODUODENOSCOPY (EGD);  Surgeon: Rogene Houston, MD;  [facility];  Service: Endoscopy;  Laterality: N/A;  1030  . HIP FRACTURE SURGERY     05/2009 left hip   . MALONEY DILATION N/A 01/26/2014   Procedure: Venia Minks DILATION;  Surgeon: Rogene Houston, MD;  [facility];  Service: Endoscopy;  Laterality: N/A;    OB History    No data available       Home Medications    Prior to Admission medications   Medication Sig Start Date End Date Taking? Authorizing Provider  ALPRAZolam Duanne Moron) 0.5 MG tablet Take 0.5 mg by mouth at bedtime as needed for anxiety.   Yes Historical Provider, MD  DULoxetine (CYMBALTA) 60 MG capsule Take 60 mg by mouth every morning.    Yes Historical Provider, MD    Family History Family History  Problem Relation Age of Onset  . Heart failure Mother   . Diabetes Mother   . Hypertension Mother   . Hypertension Sister   . Heart disease Sister     Premature  CAD  . Cancer Maternal Aunt     breast    Social History Social History  Substance Use Topics  . Smoking status: Former Smoker    Packs/day: 0.50    Years: 2.00    Types: Cigarettes    Quit date: 01/15/1961  . Smokeless tobacco: Never Used  . Alcohol use 0.0 oz/week     Comment: ETOH abuse years ago, occ drinks now     Allergies   Avelox [moxifloxacin hcl in nacl]   Review of Systems Review of Systems  Constitutional: Negative for activity change.       All ROS Neg except as noted in HPI  HENT: Negative for nosebleeds.   Eyes: Negative for photophobia and discharge.  Respiratory:  Negative for cough, shortness of breath and wheezing.   Cardiovascular: Negative for chest pain and palpitations.  Gastrointestinal: Negative for abdominal pain and blood in stool.  Genitourinary: Negative for dysuria, frequency and hematuria.  Musculoskeletal: Positive for arthralgias. Negative for back pain and neck pain.  Skin: Negative.   Neurological: Positive for headaches. Negative for dizziness, seizures, syncope, speech difficulty, weakness, light-headedness and numbness.  Psychiatric/Behavioral: Negative for confusion and hallucinations.     Physical Exam Updated Vital Signs BP (!) 176/91   Pulse 96   Temp 98.3 F (36.8 C)   Resp 18   Ht 5\' 5"  (1.651 m)   Wt 59 kg   SpO2 98%   BMI 21.63 kg/m   Physical Exam  Constitutional: She is oriented to person, place, and time. She appears well-developed and well-nourished.  Non-toxic appearance.  HENT:  Head: Normocephalic.    Right Ear: Tympanic membrane and external ear normal.  Left Ear: Tympanic membrane and external ear normal.  Eyes: EOM and lids are normal. Pupils are equal, round, and reactive to light.  Neck: Normal range of motion. Neck supple. Carotid bruit is not present.  Cardiovascular: Normal rate, regular rhythm, normal heart sounds, intact distal pulses and normal pulses.   Pulmonary/Chest: Breath sounds normal. No respiratory distress.  Abdominal: Soft. Bowel sounds are normal. There is no tenderness. There is no guarding.  Musculoskeletal: Normal range of motion.  Lymphadenopathy:       Head (right side): No submandibular adenopathy present.       Head (left side): No submandibular adenopathy present.    She has no cervical adenopathy.  Neurological: She is alert and oriented to person, place, and time. She has normal strength. No cranial nerve deficit or sensory deficit.  Skin: Skin is warm and dry.  Psychiatric: She has a normal mood and affect. Her speech is normal.  Nursing note and vitals  reviewed.    ED Treatments / Results  Labs (all labs ordered are listed, but only abnormal results are displayed) Labs Reviewed - No data to display  EKG  EKG Interpretation None       Radiology No results found.  Procedures Procedures (including critical care time)  Medications Ordered in ED Medications - No data to display   Initial Impression / Assessment and Plan / ED Course Patient has been seen by Dr. Tomi Bamberger.   I have reviewed the triage vital signs and the nursing notes.  Pertinent labs & imaging results that were available during my care of the patient were reviewed by me and considered in my medical decision making (see chart for details).     *I have reviewed nursing notes, vital signs, and all appropriate lab and imaging results for this patient.**  Final Clinical Impressions(s) / ED Diagnoses MDM Patient states she was trying to carry an armload of groceries and hold open a door the same time. She lost her balance, and struck her head on a brick patio. There was no loss of consciousness. The patient has been ambulatory since the incident without problem. No gross neurologic deficit appreciated at this time. 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 No other family in the home to help observe this patient. Will obtain CT head scan.  The CT scan of the head is negative for acute fracture or intracranial abnormality. The patient is asked to use ice pack to the hematoma of the scalp. She will use Tylenol Extra Strength for discomfort. She is to return to the emergency department or see her primary physician Dr. Gerarda Fraction if any changes or problems.    Final diagnoses:  Contusion of scalp, initial encounter    New Prescriptions New Prescriptions   No medications on file     Lily Kocher, PA-C 06/24/16 [place], PA-C 06/24/16 0122    Rolland Porter, MD 06/24/16 [phone]

## 2016-06-23 NOTE — Telephone Encounter (Signed)
  Oncology Nurse Navigator Documentation  Navigator Location: [facility] (06/23/16 1600) Referral date to RadOnc/MedOnc: 06/09/16 (06/23/16 1600) )Navigator Encounter Type: Introductory phone call (06/23/16 1600)   Abnormal Finding Date: 04/23/16 (06/23/16 1600) Confirmed Diagnosis Date: 05/19/16 (06/23/16 1600)               Patient Visit Type: MedOnc;Initial (06/23/16 1600)   Barriers/Navigation Needs: No barriers at this time (06/23/16 1600)                Acuity: Level 1 (06/23/16 1600) Acuity Level 1: Initial guidance, education and coordination as needed;Minimal follow up required (06/23/16 1600)       Time Spent with Patient: 15 (06/23/16 1600)

## 2016-06-24 DIAGNOSIS — R51 Headache: Secondary | ICD-10-CM | POA: Diagnosis not present

## 2016-06-24 MED ORDER — ACETAMINOPHEN 500 MG PO TABS
1000.0000 mg | ORAL_TABLET | Freq: Once | ORAL | Status: AC
  Administered 2016-06-24: 1000 mg via ORAL
  Filled 2016-06-24: qty 2

## 2016-06-24 NOTE — Discharge Instructions (Signed)
Your vital signs within normal limits. Your examination is negative for acute deficits. Your CT scan is negative for fracture or internal abnormality. Cool compress or ice pack to the hematoma on your scalp may be helpful. Please use Tylenol every 4 hours as needed for headache or discomfort. Please see your primary physician, or return to the emergency department if any changes, problems, or concerns.

## 2016-06-25 ENCOUNTER — Other Ambulatory Visit: Payer: Self-pay | Admitting: General Surgery

## 2016-06-25 DIAGNOSIS — Z96642 Presence of left artificial hip joint: Secondary | ICD-10-CM | POA: Diagnosis not present

## 2016-06-25 DIAGNOSIS — C50411 Malignant neoplasm of upper-outer quadrant of right female breast: Secondary | ICD-10-CM

## 2016-06-25 DIAGNOSIS — Z170 Estrogen receptor positive status [ER+]: Secondary | ICD-10-CM

## 2016-06-25 DIAGNOSIS — I10 Essential (primary) hypertension: Secondary | ICD-10-CM | POA: Diagnosis not present

## 2016-06-25 DIAGNOSIS — C50911 Malignant neoplasm of unspecified site of right female breast: Secondary | ICD-10-CM | POA: Diagnosis not present

## 2016-07-10 ENCOUNTER — Other Ambulatory Visit: Payer: Self-pay | Admitting: General Surgery

## 2016-07-10 ENCOUNTER — Encounter: Payer: Self-pay | Admitting: Radiation Oncology

## 2016-07-10 DIAGNOSIS — C50411 Malignant neoplasm of upper-outer quadrant of right female breast: Secondary | ICD-10-CM

## 2016-07-10 DIAGNOSIS — Z170 Estrogen receptor positive status [ER+]: Secondary | ICD-10-CM

## 2016-07-23 ENCOUNTER — Encounter (HOSPITAL_COMMUNITY)
Admission: RE | Admit: 2016-07-23 | Discharge: 2016-07-23 | Disposition: A | Discharge status: Home / Self Care | Payer: Medicare Other | Source: Ambulatory Visit | Attending: General Surgery | Admitting: General Surgery

## 2016-07-23 ENCOUNTER — Encounter (HOSPITAL_COMMUNITY): Payer: Self-pay

## 2016-07-23 DIAGNOSIS — Z01812 Encounter for preprocedural laboratory examination: Secondary | ICD-10-CM | POA: Diagnosis not present

## 2016-07-23 DIAGNOSIS — C50411 Malignant neoplasm of upper-outer quadrant of right female breast: Secondary | ICD-10-CM | POA: Diagnosis not present

## 2016-07-23 HISTORY — DX: Other chronic pain: G89.29

## 2016-07-23 HISTORY — DX: Insomnia, unspecified: G47.00

## 2016-07-23 HISTORY — DX: Personal history of colon polyps, unspecified: Z86.0100

## 2016-07-23 HISTORY — DX: Diverticulosis of intestine, part unspecified, without perforation or abscess without bleeding: K57.90

## 2016-07-23 HISTORY — DX: Personal history of other diseases of the digestive system: Z87.19

## 2016-07-23 HISTORY — DX: Headache, unspecified: R51.9

## 2016-07-23 HISTORY — DX: Age-related osteoporosis without current pathological fracture: M81.0

## 2016-07-23 HISTORY — DX: Headache: R51

## 2016-07-23 HISTORY — DX: Personal history of colonic polyps: Z86.010

## 2016-07-23 HISTORY — DX: Unspecified hearing loss, unspecified ear: H91.90

## 2016-07-23 HISTORY — DX: Dorsalgia, unspecified: M54.9

## 2016-07-23 LAB — CBC
HEMATOCRIT: 33.2 % — AB (ref 36.0–46.0)
HEMOGLOBIN: 10.8 g/dL — AB (ref 12.0–15.0)
MCH: 30.2 pg (ref 26.0–34.0)
MCHC: 32.5 g/dL (ref 30.0–36.0)
MCV: 92.7 fL (ref 78.0–100.0)
Platelets: 254 10*3/uL (ref 150–400)
RBC: 3.58 MIL/uL — AB (ref 3.87–5.11)
RDW: 13.1 % (ref 11.5–15.5)
WBC: 5.8 10*3/uL (ref 4.0–10.5)

## 2016-07-23 LAB — BASIC METABOLIC PANEL
Anion gap: 9 (ref 5–15)
BUN: 14 mg/dL (ref 6–20)
CHLORIDE: 100 mmol/L — AB (ref 101–111)
CO2: 26 mmol/L (ref 22–32)
Calcium: 8.7 mg/dL — ABNORMAL LOW (ref 8.9–10.3)
Creatinine, Ser: 0.92 mg/dL (ref 0.44–1.00)
GFR calc non Af Amer: 59 mL/min — ABNORMAL LOW (ref >60)
Glucose, Bld: 79 mg/dL (ref 65–99)
POTASSIUM: 3.6 mmol/L (ref 3.5–5.1)
SODIUM: 135 mmol/L (ref 135–145)

## 2016-07-23 MED ORDER — CHLORHEXIDINE GLUCONATE CLOTH 2 % EX PADS: 6.0000 | MEDICATED_PAD | Freq: Once | CUTANEOUS | Status: DC

## 2016-07-23 NOTE — Progress Notes (Addendum)
Cardiologist is Dr.McDowell, with last visit 6 months ago. States usually sees him every 6 months  Medical Md is Dr.Fusco  Echo multiple reports in epic   Stress test report in epic from 2003  Heart cath denies  EKG in epic from 10-11-15  CXR denies

## 2016-07-23 NOTE — Pre-Procedure Instructions (Signed)
[place]  07/23/2016      [place] [place] [address] Phone: [phone] Fax: [phone]    Your procedure is scheduled on Fri, May 18 @ 8:55 AM  Report to [facility] Admitting at 6:45 AM  Call this number if you have problems the morning of surgery:  [phone]   Remember:  Do not eat food or drink liquids after midnight.  Take these medicines the morning of surgery with A SIP OF WATER Cymbalta(Duloxetine)               No Goody's,BC's,Aleve,Advil,Motrin,Ibuprofen,Fish Oil,or any Herbal Medications.    Do not wear jewelry, make-up or nail polish.  Do not wear lotions, powders, perfumes, or deoderant.  Do not shave 48 hours prior to surgery.    Do not bring valuables to the hospital.  [facility] is not responsible for any belongings or valuables.  Contacts, dentures or bridgework may not be worn into surgery.  Leave your suitcase in the car.  After surgery it may be brought to your room.  For patients admitted to the hospital, discharge time will be determined by your treatment team.  Patients discharged the day of surgery will not be allowed to drive home.    [facility]  Before surgery, you can play an important role.  Because skin is not sterile, your skin needs to be as free of germs as possible.  You can reduce the number of germs on you skin by washing with CHG (chlorahexidine gluconate) soap before surgery.  CHG is an antiseptic cleaner which kills germs and bonds with the skin to continue killing germs even after washing.  Please DO NOT use if you have an allergy to CHG or antibacterial soaps.  If your skin becomes reddened/irritated stop using the CHG and inform your nurse when you arrive at Short Stay.  Do not shave (including legs and underarms) for at least 48 hours prior to the first CHG shower.  You may shave your face.  Please  follow these instructions carefully:   1.  Shower with CHG Soap the night before surgery and the                                morning of Surgery.  2.  If you choose to wash your hair, wash your hair first as usual with your       normal shampoo.  3.  After you shampoo, rinse your hair and body thoroughly to remove the                      Shampoo.  4.  Use CHG as you would any other liquid soap.  You can apply chg directly       to the skin and wash gently with scrungie or a clean washcloth.  5.  Apply the CHG Soap to your body ONLY FROM THE NECK DOWN.        Do not use on open wounds or open sores.  Avoid contact with your eyes,       ears, mouth and genitals (private parts).  Wash genitals (private parts)       with your normal soap.  6.  Wash thoroughly, paying special attention to the area where your surgery  will be performed.  7.  Thoroughly rinse your body with warm water from the neck down.  8.  DO NOT shower/wash with your normal soap after using and rinsing off       the CHG Soap.  9.  Pat yourself dry with a clean towel.            10.  Wear clean pajamas.            11.  Place clean sheets on your bed the night of your first shower and do not        sleep with pets.  Day of Surgery  Do not apply any lotions/deoderants the morning of surgery.  Please wear clean clothes to the hospital/surgery center.    Please read over the following fact sheets that you were given. Pain Booklet, Coughing and Deep Breathing and Surgical Site Infection Prevention

## 2016-07-29 NOTE — H&P (Signed)
Marilyn Haas Location: [facility] Patient #: [id number] DOB: 09/29/1940 Married / Language: English / Race: White Female        History of Present Illness        The patient is a 76 year old female who presents with breast cancer. This is a delightful 76 year old female who returns with her son to discuss and plan definitive surgery for her right breast cancer. Her PCP is Dr. Redmond School. Her cardiologist is Dr. Johnny Bridge, and recent cardiac workup states she is low risk. Dr. Jana Hakim and Dr. Lisbeth Renshaw are now involved in her care.      Recent screening mammography showed a 1.4 cm mass in the right breast at the 11 o'clock position, 1 cm from the areolar margin. Axillary ultrasound was negative. 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 ER 90%. PR 15%. Ki-67 5% subsequent MRI showed this was a solitary finding She has seen Dr. Jana Hakim and Dr. Lisbeth Renshaw, and we have discussed this in breast conference. Recommendation is right breast lumpectomy and sentinel lymph node biopsy. She is in favor of this and is much more interested in breast conservation than mastectomy.      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 History of PVCs, normal echo, normal EF. Hypertension.      Family history reveals maternal aunt was treated for breast cancer. Sister had a mastectomy but this may have been complications of leaking implants. No ovarian cancer. Mother died of congestive heart failure and diabetes. Father died of ruptured abdominal aortic aneurysm     She and her husband live in their own home. Husband has some dementia. She is independent and drives her own car. Her son lives in [place] and has been very attentive and present for each visit. The patient has 4 children. Denies alcohol or tobacco.      We have no long talk today. She will be scheduled for right breast lumpectomy with radioactive seed localization and  right axillary sentinel lymph node biopsy. I have discussed indications, details, techniques, and numerous risk of the surgery with the patient and her son. She is aware of the risks of bleeding, infection, reoperation for positive margins or positive nodes, cosmetic deformity, chronic pain, and other unforeseen problems. She understands all these issues well. All of her questions are answered. She agrees with this plan.      She knows that she may or may not need radiation therapy and she knows that she may or may not be offered antiestrogen therapy depending on pathology.    Allergies  Avelox *FLUOROQUINOLONES*  Anaphylaxis. Allergies Reconciled   Medication History  Hydrocodone-Acetaminophen (5-325MG Tablet, Oral) Active. Xanax (0.5MG Tablet, Oral) Active. Cymbalta (20MG Capsule DR Part, Oral two times daily) Active. Medications Reconciled  Vitals  Weight: 130.4 lb Height: 65in Body Surface Area: 1.65 m Body Mass Index: 21.7 kg/m  Temp.: 98.1F  Pulse: 89 (Regular)  BP: 138/82 (Sitting, Left Arm, Standard)       Physical Exam  General Mental Status-Alert. General Appearance-Not in acute distress. Build & Nutrition-Well nourished. Posture-Normal posture. Gait-Normal.  Head and Neck Head-normocephalic, atraumatic with no lesions or palpable masses. Trachea-midline. Thyroid Gland Characteristics - normal size and consistency and no palpable nodules.  Chest and Lung Exam Chest and lung exam reveals -on auscultation, normal breath sounds, no adventitious sounds and normal vocal resonance.  Breast Note: Both breasts examined. They are atrophic. The ecchymosis on the right side have resolved. Small  palpable mass in the right breast at the 11 position 1 cm. peripheral to the areolar margin. She pointed out a little tender area at the most medial inferior part of the left breast and there is no physical abnormality no skin change or  mass. There is no axillary adenopathy on either side.   Cardiovascular Cardiovascular examination reveals -normal heart sounds, regular rate and rhythm with no murmurs and femoral artery auscultation bilaterally reveals normal pulses, no bruits, no thrills.  Abdomen Inspection Inspection of the abdomen reveals - No Hernias. Palpation/Percussion Palpation and Percussion of the abdomen reveal - Soft, Non Tender, No Rigidity (guarding), No hepatosplenomegaly and No Palpable abdominal masses.  Neurologic Neurologic evaluation reveals -alert and oriented x 3 with no impairment of recent or remote memory, normal attention span and ability to concentrate, normal sensation and normal coordination.  Musculoskeletal Normal Exam - Bilateral-Upper Extremity Strength Normal and Lower Extremity Strength Normal.    Assessment & Plan PRIMARY CANCER OF UPPER OUTER QUADRANT OF RIGHT FEMALE BREAST (C50.411  You have completed your consultations with Dr. Jana Hakim and Dr. Lisbeth Renshaw at the cancer center. They have discussed the other forms of treatment that you might be given You have had an MRI, and fortunately the cancer in your right breast is a solitary finding. 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 Dalbert Batman, M.D., [facility] General and Minimally invasive Surgery Breast and Colorectal Surgery Office:   [phone] Pager:   [phone]

## 2016-07-30 ENCOUNTER — Ambulatory Visit
Admission: RE | Admit: 2016-07-30 | Discharge: 2016-07-30 | Disposition: A | Discharge status: Home / Self Care | Payer: Medicare Other | Source: Ambulatory Visit | Attending: General Surgery | Admitting: General Surgery

## 2016-07-30 ENCOUNTER — Other Ambulatory Visit: Payer: Self-pay | Admitting: General Surgery

## 2016-07-30 DIAGNOSIS — C50411 Malignant neoplasm of upper-outer quadrant of right female breast: Secondary | ICD-10-CM

## 2016-07-30 DIAGNOSIS — Z170 Estrogen receptor positive status [ER+]: Secondary | ICD-10-CM

## 2016-07-30 DIAGNOSIS — N6311 Unspecified lump in the right breast, upper outer quadrant: Secondary | ICD-10-CM | POA: Diagnosis not present

## 2016-07-31 ENCOUNTER — Ambulatory Visit (HOSPITAL_COMMUNITY)
Admission: RE | Admit: 2016-07-31 | Discharge: 2016-07-31 | Disposition: A | Discharge status: Home / Self Care | Payer: Medicare Other | Source: Ambulatory Visit | Attending: General Surgery | Admitting: General Surgery

## 2016-07-31 ENCOUNTER — Ambulatory Visit (HOSPITAL_COMMUNITY): Payer: Medicare Other | Admitting: Anesthesiology

## 2016-07-31 ENCOUNTER — Ambulatory Visit
Admission: RE | Admit: 2016-07-31 | Discharge: 2016-07-31 | Disposition: A | Discharge status: Home / Self Care | Payer: Medicare Other | Source: Ambulatory Visit | Attending: General Surgery | Admitting: General Surgery

## 2016-07-31 ENCOUNTER — Encounter (HOSPITAL_COMMUNITY)
Admission: RE | Admit: 2016-07-31 | Discharge: 2016-07-31 | Disposition: A | Discharge status: Home / Self Care | Payer: Medicare Other | Source: Ambulatory Visit | Attending: General Surgery | Admitting: General Surgery

## 2016-07-31 ENCOUNTER — Encounter (HOSPITAL_COMMUNITY)
Admission: RE | Disposition: A | Discharge status: Home / Self Care | Payer: Self-pay | Source: Ambulatory Visit | Attending: General Surgery

## 2016-07-31 ENCOUNTER — Encounter (HOSPITAL_COMMUNITY): Payer: Self-pay | Admitting: *Deleted

## 2016-07-31 DIAGNOSIS — F419 Anxiety disorder, unspecified: Secondary | ICD-10-CM | POA: Insufficient documentation

## 2016-07-31 DIAGNOSIS — C50411 Malignant neoplasm of upper-outer quadrant of right female breast: Secondary | ICD-10-CM

## 2016-07-31 DIAGNOSIS — N6011 Diffuse cystic mastopathy of right breast: Secondary | ICD-10-CM | POA: Diagnosis not present

## 2016-07-31 DIAGNOSIS — G8918 Other acute postprocedural pain: Secondary | ICD-10-CM | POA: Diagnosis not present

## 2016-07-31 DIAGNOSIS — Z803 Family history of malignant neoplasm of breast: Secondary | ICD-10-CM | POA: Diagnosis not present

## 2016-07-31 DIAGNOSIS — Z96642 Presence of left artificial hip joint: Secondary | ICD-10-CM | POA: Diagnosis not present

## 2016-07-31 DIAGNOSIS — Z79891 Long term (current) use of opiate analgesic: Secondary | ICD-10-CM | POA: Insufficient documentation

## 2016-07-31 DIAGNOSIS — Z79899 Other long term (current) drug therapy: Secondary | ICD-10-CM | POA: Insufficient documentation

## 2016-07-31 DIAGNOSIS — Z170 Estrogen receptor positive status [ER+]: Secondary | ICD-10-CM | POA: Diagnosis not present

## 2016-07-31 DIAGNOSIS — Z881 Allergy status to other antibiotic agents status: Secondary | ICD-10-CM | POA: Diagnosis not present

## 2016-07-31 DIAGNOSIS — E785 Hyperlipidemia, unspecified: Secondary | ICD-10-CM | POA: Diagnosis not present

## 2016-07-31 DIAGNOSIS — I10 Essential (primary) hypertension: Secondary | ICD-10-CM | POA: Insufficient documentation

## 2016-07-31 DIAGNOSIS — R921 Mammographic calcification found on diagnostic imaging of breast: Secondary | ICD-10-CM | POA: Diagnosis not present

## 2016-07-31 DIAGNOSIS — C50911 Malignant neoplasm of unspecified site of right female breast: Secondary | ICD-10-CM | POA: Diagnosis not present

## 2016-07-31 DIAGNOSIS — Z87891 Personal history of nicotine dependence: Secondary | ICD-10-CM | POA: Insufficient documentation

## 2016-07-31 HISTORY — PX: BREAST LUMPECTOMY WITH RADIOACTIVE SEED AND SENTINEL LYMPH NODE BIOPSY: SHX6550

## 2016-07-31 SURGERY — BREAST LUMPECTOMY WITH RADIOACTIVE SEED AND SENTINEL LYMPH NODE BIOPSY
Anesthesia: Regional | Site: Breast | Laterality: Right

## 2016-07-31 MED ORDER — BUPIVACAINE HCL (PF) 0.25 % IJ SOLN
INTRAMUSCULAR | Status: DC | PRN
Start: 2016-07-31 — End: 2016-07-31
  Administered 2016-07-31: 30 mL

## 2016-07-31 MED ORDER — SODIUM CHLORIDE 0.9 % IJ SOLN
INTRAMUSCULAR | Status: AC
  Filled 2016-07-31: qty 10

## 2016-07-31 MED ORDER — METHYLENE BLUE 0.5 % INJ SOLN
INTRAVENOUS | Status: AC
  Filled 2016-07-31: qty 10

## 2016-07-31 MED ORDER — OXYCODONE HCL 5 MG PO TABS
ORAL_TABLET | ORAL | Status: AC
  Filled 2016-07-31: qty 1

## 2016-07-31 MED ORDER — PHENYLEPHRINE HCL 10 MG/ML IJ SOLN
INTRAVENOUS | Status: DC | PRN
  Administered 2016-07-31: 20 ug/min via INTRAVENOUS

## 2016-07-31 MED ORDER — FENTANYL CITRATE (PF) 100 MCG/2ML IJ SOLN
INTRAMUSCULAR | Status: DC | PRN
  Administered 2016-07-31: 50 ug via INTRAVENOUS

## 2016-07-31 MED ORDER — FENTANYL CITRATE (PF) 100 MCG/2ML IJ SOLN
INTRAMUSCULAR | Status: AC
  Filled 2016-07-31: qty 2

## 2016-07-31 MED ORDER — ONDANSETRON HCL 4 MG/2ML IJ SOLN
INTRAMUSCULAR | Status: DC | PRN
  Administered 2016-07-31: 4 mg via INTRAVENOUS

## 2016-07-31 MED ORDER — 0.9 % SODIUM CHLORIDE (POUR BTL) OPTIME
TOPICAL | Status: DC | PRN
  Administered 2016-07-31: 1000 mL

## 2016-07-31 MED ORDER — TECHNETIUM TC 99M SULFUR COLLOID FILTERED
1.0000 | Freq: Once | INTRAVENOUS | Status: AC | PRN
  Administered 2016-07-31: 1 via INTRADERMAL

## 2016-07-31 MED ORDER — CEFAZOLIN SODIUM-DEXTROSE 2-4 GM/100ML-% IV SOLN
2.0000 g | INTRAVENOUS | Status: DC
  Filled 2016-07-31: qty 100

## 2016-07-31 MED ORDER — PROPOFOL 10 MG/ML IV BOLUS
INTRAVENOUS | Status: DC | PRN
  Administered 2016-07-31: 150 mg via INTRAVENOUS

## 2016-07-31 MED ORDER — DEXAMETHASONE SODIUM PHOSPHATE 10 MG/ML IJ SOLN
INTRAMUSCULAR | Status: DC | PRN
  Administered 2016-07-31: 5 mg via INTRAVENOUS

## 2016-07-31 MED ORDER — FENTANYL CITRATE (PF) 100 MCG/2ML IJ SOLN
25.0000 ug | INTRAMUSCULAR | Status: DC | PRN
  Administered 2016-07-31: 25 ug via INTRAVENOUS

## 2016-07-31 MED ORDER — ACETAMINOPHEN 325 MG PO TABS: 650.0000 mg | ORAL_TABLET | ORAL | Status: DC | PRN

## 2016-07-31 MED ORDER — ACETAMINOPHEN 650 MG RE SUPP: 650.0000 mg | RECTAL | Status: DC | PRN

## 2016-07-31 MED ORDER — FENTANYL CITRATE (PF) 100 MCG/2ML IJ SOLN
50.0000 ug | Freq: Once | INTRAMUSCULAR | Status: AC
  Administered 2016-07-31: 50 ug via INTRAVENOUS

## 2016-07-31 MED ORDER — OXYCODONE HCL 5 MG PO TABS
5.0000 mg | ORAL_TABLET | ORAL | Status: DC | PRN
  Administered 2016-07-31: 5 mg via ORAL

## 2016-07-31 MED ORDER — MIDAZOLAM HCL 2 MG/2ML IJ SOLN
1.0000 mg | Freq: Once | INTRAMUSCULAR | Status: AC
  Administered 2016-07-31: 1 mg via INTRAVENOUS

## 2016-07-31 MED ORDER — LIDOCAINE HCL (CARDIAC) 20 MG/ML IV SOLN
INTRAVENOUS | Status: DC | PRN
Start: 2016-07-31 — End: 2016-07-31
  Administered 2016-07-31: 60 mg via INTRAVENOUS

## 2016-07-31 MED ORDER — GABAPENTIN 300 MG PO CAPS
300.0000 mg | ORAL_CAPSULE | ORAL | Status: AC
  Administered 2016-07-31: 300 mg via ORAL
  Filled 2016-07-31: qty 1

## 2016-07-31 MED ORDER — ROPIVACAINE HCL 5 MG/ML IJ SOLN
INTRAMUSCULAR | Status: DC | PRN
  Administered 2016-07-31: 30 mL via PERINEURAL

## 2016-07-31 MED ORDER — HYDROCODONE-ACETAMINOPHEN 5-325 MG PO TABS: 1.0000 | ORAL_TABLET | Freq: Four times a day (QID) | ORAL | 0 refills | Status: DC | PRN

## 2016-07-31 MED ORDER — BUPIVACAINE HCL (PF) 0.25 % IJ SOLN
INTRAMUSCULAR | Status: AC
  Filled 2016-07-31: qty 30

## 2016-07-31 MED ORDER — LACTATED RINGERS IV SOLN
INTRAVENOUS | Status: DC
  Administered 2016-07-31: 08:00:00 via INTRAVENOUS

## 2016-07-31 MED ORDER — MIDAZOLAM HCL 2 MG/2ML IJ SOLN
INTRAMUSCULAR | Status: AC
  Filled 2016-07-31: qty 2

## 2016-07-31 MED ORDER — FENTANYL CITRATE (PF) 100 MCG/2ML IJ SOLN: 25.0000 ug | INTRAMUSCULAR | Status: DC | PRN

## 2016-07-31 MED ORDER — ACETAMINOPHEN 500 MG PO TABS
1000.0000 mg | ORAL_TABLET | ORAL | Status: AC
  Administered 2016-07-31: 1000 mg via ORAL
  Filled 2016-07-31: qty 2

## 2016-07-31 MED ORDER — FENTANYL CITRATE (PF) 250 MCG/5ML IJ SOLN
INTRAMUSCULAR | Status: AC
  Filled 2016-07-31: qty 5

## 2016-07-31 MED ORDER — SODIUM CHLORIDE 0.9 % IJ SOLN
INTRAVENOUS | Status: DC | PRN
  Administered 2016-07-31: 10:00:00

## 2016-07-31 MED ORDER — ONDANSETRON HCL 4 MG/2ML IJ SOLN: 4.0000 mg | Freq: Once | INTRAMUSCULAR | Status: DC | PRN

## 2016-07-31 MED ORDER — CELECOXIB 200 MG PO CAPS
400.0000 mg | ORAL_CAPSULE | ORAL | Status: AC
  Administered 2016-07-31: 400 mg via ORAL
  Filled 2016-07-31: qty 2

## 2016-07-31 SURGICAL SUPPLY — 50 items
APPLIER CLIP 9.375 MED OPEN (MISCELLANEOUS) ×2
BINDER BREAST LRG (GAUZE/BANDAGES/DRESSINGS) ×2 IMPLANT
BINDER BREAST XLRG (GAUZE/BANDAGES/DRESSINGS) IMPLANT
BLADE SURG 15 STRL LF DISP TIS (BLADE) ×2 IMPLANT
BLADE SURG 15 STRL SS (BLADE) ×2
CANISTER SUCT 3000ML PPV (MISCELLANEOUS) ×2 IMPLANT
CHLORAPREP W/TINT 26ML (MISCELLANEOUS) ×2 IMPLANT
CLIP APPLIE 9.375 MED OPEN (MISCELLANEOUS) ×1 IMPLANT
CONT SPEC 4OZ CLIKSEAL STRL BL (MISCELLANEOUS) ×8 IMPLANT
COVER PROBE W GEL 5X96 (DRAPES) ×2 IMPLANT
COVER SURGICAL LIGHT HANDLE (MISCELLANEOUS) ×2 IMPLANT
DERMABOND ADVANCED (GAUZE/BANDAGES/DRESSINGS) ×1
DERMABOND ADVANCED .7 DNX12 (GAUZE/BANDAGES/DRESSINGS) ×1 IMPLANT
DEVICE DUBIN SPECIMEN MAMMOGRA (MISCELLANEOUS) ×2 IMPLANT
DRAPE CHEST BREAST 15X10 FENES (DRAPES) ×2 IMPLANT
DRAPE HALF SHEET 40X57 (DRAPES) ×2 IMPLANT
DRAPE UTILITY XL STRL (DRAPES) ×2 IMPLANT
DRSG PAD ABDOMINAL 8X10 ST (GAUZE/BANDAGES/DRESSINGS) ×2 IMPLANT
ELECT CAUTERY BLADE 6.4 (BLADE) ×2 IMPLANT
ELECT REM PT RETURN 9FT ADLT (ELECTROSURGICAL) ×2
ELECTRODE REM PT RTRN 9FT ADLT (ELECTROSURGICAL) ×1 IMPLANT
FILTER STRAW FLUID ASPIR (MISCELLANEOUS) IMPLANT
GAUZE SPONGE 4X4 12PLY STRL (GAUZE/BANDAGES/DRESSINGS) ×2 IMPLANT
GAUZE SPONGE 4X4 12PLY STRL LF (GAUZE/BANDAGES/DRESSINGS) ×2 IMPLANT
GLOVE EUDERMIC 7 POWDERFREE (GLOVE) ×2 IMPLANT
GOWN STRL REUS W/ TWL LRG LVL3 (GOWN DISPOSABLE) ×1 IMPLANT
GOWN STRL REUS W/ TWL XL LVL3 (GOWN DISPOSABLE) ×1 IMPLANT
GOWN STRL REUS W/TWL LRG LVL3 (GOWN DISPOSABLE) ×1
GOWN STRL REUS W/TWL XL LVL3 (GOWN DISPOSABLE) ×1
ILLUMINATOR WAVEGUIDE N/F (MISCELLANEOUS) IMPLANT
KIT BASIN OR (CUSTOM PROCEDURE TRAY) ×2 IMPLANT
KIT MARKER MARGIN INK (KITS) ×2 IMPLANT
LIGHT WAVEGUIDE WIDE FLAT (MISCELLANEOUS) IMPLANT
NDL SAFETY ECLIPSE 18X1.5 (NEEDLE) IMPLANT
NEEDLE HYPO 18GX1.5 SHARP (NEEDLE)
NEEDLE HYPO 25X1 1.5 SAFETY (NEEDLE) ×2 IMPLANT
NS IRRIG 1000ML POUR BTL (IV SOLUTION) ×2 IMPLANT
PACK SURGICAL SETUP 50X90 (CUSTOM PROCEDURE TRAY) ×2 IMPLANT
PAD ABD 8X10 STRL (GAUZE/BANDAGES/DRESSINGS) ×2 IMPLANT
PENCIL BUTTON HOLSTER BLD 10FT (ELECTRODE) ×2 IMPLANT
SPONGE LAP 4X18 X RAY DECT (DISPOSABLE) ×6 IMPLANT
SUT MNCRL AB 4-0 PS2 18 (SUTURE) ×4 IMPLANT
SUT SILK 2 0 SH (SUTURE) ×2 IMPLANT
SUT VIC AB 3-0 SH 18 (SUTURE) ×2 IMPLANT
SYR BULB 3OZ (MISCELLANEOUS) ×2 IMPLANT
SYR CONTROL 10ML LL (SYRINGE) ×2 IMPLANT
TOWEL OR 17X24 6PK STRL BLUE (TOWEL DISPOSABLE) ×2 IMPLANT
TOWEL OR 17X26 10 PK STRL BLUE (TOWEL DISPOSABLE) ×2 IMPLANT
TUBE CONNECTING 12X1/4 (SUCTIONS) ×2 IMPLANT
YANKAUER SUCT BULB TIP NO VENT (SUCTIONS) ×2 IMPLANT

## 2016-07-31 NOTE — Anesthesia Preprocedure Evaluation (Addendum)
Anesthesia Evaluation  Patient identified by MRN, date of birth, ID band Patient awake    Reviewed: Allergy & Precautions, NPO status , Patient's Chart, lab work & pertinent test results  Airway Mallampati: III  TM Distance: >3 FB Neck ROM: Full    Dental no notable dental hx.    Pulmonary neg pulmonary ROS, asthma (childhood) , former smoker,    Pulmonary exam normal breath sounds clear to auscultation       Cardiovascular Exercise Tolerance: Good negative cardio ROS Normal cardiovascular exam Rhythm:Regular Rate:Normal  Sees cardiologist Domenic Polite) ECG: SR, rate 68 ECHO:- Left ventricle: The cavity size was normal. Wall thickness was   increased in a pattern of mild LVH. Systolic function was normal.   The estimated ejection fraction was in the range of 55% to 60%.   Wall motion was normal; there were no regional wall motion   abnormalities. Doppler parameters are consistent with abnormal   left ventricular relaxation (grade 1 diastolic dysfunction). - Aortic valve: Mildly calcified annulus. Trileaflet. There was   mild regurgitation. - Mitral valve: There was mild regurgitation. - Right atrium: Central venous pressure (est): 3 mm Hg. - Atrial septum: No defect or patent foramen ovale was identified. - Tricuspid valve: There was mild regurgitation. - Pulmonary arteries: PA peak pressure: 22 mm Hg (S).   Neuro/Psych Anxiety Bipolar Disorder negative neurological ROS     GI/Hepatic negative GI ROS, Neg liver ROS, GERD (no medications)  ,  Endo/Other  negative endocrine ROS  Renal/GU negative Renal ROS  negative genitourinary   Musculoskeletal negative musculoskeletal ROS (+)   Abdominal   Peds negative pediatric ROS (+)  Hematology  (+) anemia ,   Anesthesia Other Findings   Reproductive/Obstetrics negative OB ROS                            Anesthesia Physical Anesthesia  Plan  ASA: III  Anesthesia Plan: General and Regional   Post-op Pain Management:    Induction: Intravenous  Airway Management Planned:   Additional Equipment:   Intra-op Plan:   Post-operative Plan: Extubation in OR  Informed Consent: I have reviewed the patients History and Physical, chart, labs and discussed the procedure including the risks, benefits and alternatives for the proposed anesthesia with the patient or authorized representative who has indicated his/her understanding and acceptance.   Dental advisory given  Plan Discussed with: CRNA  Anesthesia Plan Comments:         Anesthesia Quick Evaluation

## 2016-07-31 NOTE — Anesthesia Procedure Notes (Addendum)
Anesthesia Regional Block: Pectoralis block   Pre-Anesthetic Checklist: ,, timeout performed, Correct Patient, Correct Site, Correct Laterality, Correct Procedure, Correct Position, site marked, Risks and benefits discussed,  Surgical consent,  Pre-op evaluation,  At surgeon's request and post-op pain management  Laterality: Right  Prep: chloraprep       Needles:  Injection technique: Single-shot  Needle Type: Echogenic Stimulator Needle     Needle Length: 9cm  Needle Gauge: 21     Additional Needles:   Procedures: ultrasound guided,,,,,,,,  Narrative:  Start time: 07/31/2016 8:45 AM End time: 07/31/2016 8:55 AM Injection made incrementally with aspirations every 5 mL.  Performed by: Personally  Anesthesiologist: Adele Barthel P  Additional Notes: Functioning IV was confirmed and monitors were applied.  A 45mm 22ga Arrow echogenic stimulator needle was used. Sterile prep,hand hygiene and sterile gloves were used.  Negative aspiration and negative test dose prior to incremental administration of local anesthetic. The patient tolerated the procedure well.  Ultrasound guidance: relevent anatomy identified, needle position confirmed, local anesthetic spread visualized around nerve(s), vascular puncture avoided.  Image printed for medical record.

## 2016-07-31 NOTE — Anesthesia Postprocedure Evaluation (Signed)
Anesthesia Post Note  Patient: Marilyn Haas  Procedure(s) Performed: Procedure(s) (LRB): BREAST LUMPECTOMY WITH RADIOACTIVE SEED AND SENTINEL LYMPH NODE BIOPSY, INJECT BLUE DYE RIGHT BREAST (Right)  Patient location during evaluation: PACU Anesthesia Type: Regional and General Level of consciousness: awake and alert Pain management: pain level controlled Vital Signs Assessment: post-procedure vital signs reviewed and stable Respiratory status: spontaneous breathing, nonlabored ventilation, respiratory function stable and patient connected to nasal cannula oxygen Cardiovascular status: blood pressure returned to baseline and stable Postop Assessment: no signs of nausea or vomiting Anesthetic complications: no       Last Vitals:  Vitals:   07/31/16 1200 07/31/16 1212  BP: (!) 181/99 (!) 146/81  Pulse: 80   Resp: 16 16  Temp:      Last Pain:  Vitals:   07/31/16 0701  TempSrc: Oral                 Mikayela Deats P Rydell Wiegel

## 2016-07-31 NOTE — Discharge Instructions (Signed)
[facility] °Office Phone Number [phone] ° °BREAST BIOPSY/ PARTIAL MASTECTOMY: POST OP INSTRUCTIONS ° °Always review your discharge instruction sheet given to you by the facility where your surgery was performed. ° °IF YOU HAVE DISABILITY OR FAMILY LEAVE FORMS, YOU MUST BRING THEM TO THE OFFICE FOR PROCESSING.  DO NOT GIVE THEM TO YOUR DOCTOR. ° °1. A prescription for pain medication may be given to you upon discharge.  Take your pain medication as prescribed, if needed.  If narcotic pain medicine is not needed, then you may take acetaminophen (Tylenol) or ibuprofen (Advil) as needed. °2. Take your usually prescribed medications unless otherwise directed °3. If you need a refill on your pain medication, please contact your pharmacy.  They will contact our office to request authorization.  Prescriptions will not be filled after 5pm or on week-ends. °4. You should eat very light the first 24 hours after surgery, such as soup, crackers, pudding, etc.  Resume your normal diet the day after surgery. °5. Most patients will experience some swelling and bruising in the breast.  Ice packs and a good support bra will help.  Swelling and bruising can take several days to resolve.  °6. It is common to experience some constipation if taking pain medication after surgery.  Increasing fluid intake and taking a stool softener will usually help or prevent this problem from occurring.  A mild laxative (Milk of Magnesia or Miralax) should be taken according to package directions if there are no bowel movements after 48 hours. °7. Unless discharge instructions indicate otherwise, you may remove your bandages 24-48 hours after surgery, and you may shower at that time.  You may have steri-strips (small skin tapes) in place directly over the incision.  These strips should be left on the skin for 7-10 days.  If your surgeon used skin glue on the incision, you may shower in 24 hours.  The glue will flake off over the  next 2-3 weeks.  Any sutures or staples will be removed at the office during your follow-up visit. °8. ACTIVITIES:  You may resume regular daily activities (gradually increasing) beginning the next day.  Wearing a good support bra or sports bra minimizes pain and swelling.  You may have sexual intercourse when it is comfortable. °a. You may drive when you no longer are taking prescription pain medication, you can comfortably wear a seatbelt, and you can safely maneuver your car and apply brakes. °b. RETURN TO WORK:  ______________________________________________________________________________________ °9. You should see your doctor in the office for a follow-up appointment approximately two weeks after your surgery.  Your doctor’s nurse will typically make your follow-up appointment when she calls you with your pathology report.  Expect your pathology report 2-3 business days after your surgery.  You may call to check if you do not hear from us after three days. °10. OTHER INSTRUCTIONS: _______________________________________________________________________________________________ _____________________________________________________________________________________________________________________________________ °_____________________________________________________________________________________________________________________________________ °_____________________________________________________________________________________________________________________________________ ° °WHEN TO CALL YOUR DOCTOR: °1. Fever over 101.0 °2. Nausea and/or vomiting. °3. Extreme swelling or bruising. °4. Continued bleeding from incision. °5. Increased pain, redness, or drainage from the incision. ° °The clinic staff is available to answer your questions during regular business hours.  Please don’t hesitate to call and ask to speak to one of the nurses for clinical concerns.  If you have a medical emergency, go to the nearest  emergency room or call 911.  A surgeon from [facility] is always on call at the hospital. ° °For further questions, please visit [email or web address]  °

## 2016-07-31 NOTE — Interval H&P Note (Signed)
History and Physical Interval Note:  07/31/2016 8:23 AM  Marilyn Haas  has presented today for surgery, with the diagnosis of LEFT BREAST CANCER  The various methods of treatment have been discussed with the patient and family. After consideration of risks, benefits and other options for treatment, the patient has consented to  Procedure(s): BREAST LUMPECTOMY WITH RADIOACTIVE SEED AND SENTINEL LYMPH NODE BIOPSY, INJECT BLUE DYE LEFT BREAST (Left) as a surgical intervention .  The patient's history has been reviewed, patient examined, no change in status, stable for surgery.  I have reviewed the patient's chart and labs.  Questions were answered to the patient's satisfaction.     Adin Hector

## 2016-07-31 NOTE — Transfer of Care (Signed)
Immediate Anesthesia Transfer of Care Note  Patient: Marilyn Haas  Procedure(s) Performed: Procedure(s): BREAST LUMPECTOMY WITH RADIOACTIVE SEED AND SENTINEL LYMPH NODE BIOPSY, INJECT BLUE DYE RIGHT BREAST (Right)  Patient Location: PACU  Anesthesia Type:General  Level of Consciousness: awake, alert  and oriented  Airway & Oxygen Therapy: Patient Spontanous Breathing and Patient connected to nasal cannula oxygen  Post-op Assessment: Report given to RN and Post -op Vital signs reviewed and stable  Post vital signs: Reviewed and stable  Last Vitals:  Vitals:   07/31/16 0855 07/31/16 0900  BP: (!) 165/87 (!) 174/67  Pulse: 78 79  Resp: 20 13  Temp:      Last Pain:  Vitals:   07/31/16 0701  TempSrc: Oral      Patients Stated Pain Goal: 4 (19/59/74 7185)  Complications: No apparent anesthesia complications

## 2016-07-31 NOTE — Anesthesia Procedure Notes (Signed)
Procedure Name: LMA Insertion Date/Time: 07/31/2016 9:20 AM Performed by: Izora Gala Pre-anesthesia Checklist: Patient identified, Emergency Drugs available, Suction available and Patient being monitored Patient Re-evaluated:Patient Re-evaluated prior to inductionOxygen Delivery Method: Circle system utilized Preoxygenation: Pre-oxygenation with 100% oxygen Intubation Type: IV induction Ventilation: Mask ventilation without difficulty LMA: LMA inserted LMA Size: 4.0 Number of attempts: 1 Placement Confirmation: positive ETCO2 and breath sounds checked- equal and bilateral Tube secured with: Tape Dental Injury: Teeth and Oropharynx as per pre-operative assessment

## 2016-07-31 NOTE — Op Note (Signed)
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 Dalbert Batman, M.D., FACS  Assistant:                      OR staff   Indication for Assistant: n/a  Operative Indications:     This is a 76 year old female who is brought to the operating room for definitive surgery for her right breast cancer. Her PCP is Dr. Redmond School. Her cardiologist is Dr. Johnny Bridge, and recent cardiac workup states she is low risk. Dr. Jana Hakim and Dr. Lisbeth Renshaw are now involved in her care.      Recent screening mammography showed a 1.4 cm mass in the right breast at the 11 o'clock position, 1 cm from the areolar margin. Axillary ultrasound was negative. 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 ER 90%. PR 15%. Ki-67 5% subsequent MRI showed this was a solitary finding She has seen Dr. Jana Hakim and Dr. Lisbeth Renshaw, and we have discussed this in breast conference. Recommendation is right breast lumpectomy and sentinel lymph node biopsy. She is in favor of this and is much more interested in breast conservation than mastectomy.      Family history reveals maternal aunt was treated for breast cancer. Sister had a mastectomy but this may have been complications of leaking implants. No ovarian cancer.      She and her husband live in their own home. Husband has some dementia. She is independent and drives her own car. Her son lives in [place] and has  been very attentive and present for each visit. The patient has 4 children. Denies alcohol or tobacco.      We had a long talk . She will be scheduled for right breast lumpectomy with radioactive seed localization and right axillary sentinel lymph node biopsy.  She agrees with this plan.      She knows that she may or may not need radiation therapy and she knows that she may or may not  be offered antiestrogen therapy depending on pathology.  Operative Findings:       The right breast cancer seemed to be at the 12:00 position just above the areolar margin.  We were able to make a circumareolar incision.  The specimen mammogram looked good.  I wondered if I was close infero-medially so I reexcised the inferomedial margin.  I found 2 sentinel lymph nodes and removed a little bit of axillary tissue in the process.  Procedure in Detail:          The patient underwent a pectoral block by the anesthesiologist.  Technetium 99 radionuclide was injected into the right breast by the nuclear medicine technician.  The patient was was taken to the operating room and underwent general LMA anesthesia.  Surgical timeout was performed.  Intravenous antibiotics were given.  Following alcohol prep I injected 5 mL of dilute methylene blue into the right breast subareolar area and massaged the breast for a few minutes.  The right chest wall and axilla were then prepped and draped in a sterile fashion.  0.5% Marcaine was used as a local infiltration anesthetic.      Using the neoprobe I found the maximum radioactivity at the 12:00 position at the areolar margin.  I made a generous incision at the areolar margin superiorly.  The lumpectomy was performed with electrocautery and the neoprobe.  The inferomedial margin was reexcised.  The specimen mammogram looked good.  The specimen was pinned and sent to the lab.  Hemostasis was excellent and achieved electrocautery.  The wound was irrigated.  The lumpectomy cavity was marked with  5 metallic clips according to our protocol.  The breast tissues were reapproximated with 3-0 Vicryl sutures and skin closed with a running subcuticular 4-0 Monocryl and Dermabond.     I reset the neoprobe to technetium setting and made a transverse incision in the right axilla at the hairline.  Dissection was taken down through the clavipectoral fascia.  I found 2 obvious sentinel nodes and then some additional  tissue was excised incidentally and sent to the lab.  The wound was irrigated.  Hemostasis was excellent and achieved with electrocautery and one metal clip.  After irrigating the wound I closed the clavipectoral fascia with 3-0 Vicryl sutures and the skin was closed with a running subcuticular 4-0 Monocryl and Dermabond.  Clean bandages and a breast binder were placed.  The patient tolerated the procedure well was taken to PACU in stable condition.  EBL 25 mL or less.  Counts correct.  Complications none.     Edsel Petrin. Dalbert Batman, M.D., FACS General and Minimally Invasive Surgery Breast and Colorectal Surgery    Addendum: I logged onto the [facility] website and reviewed her prescription medication history  07/31/2016 10:54 AM

## 2016-08-01 ENCOUNTER — Encounter (HOSPITAL_COMMUNITY): Payer: Self-pay | Admitting: General Surgery

## 2016-08-04 NOTE — Progress Notes (Signed)
Inform patient of Pathology report,. Tell her that the cancer was completely removed with negative margins. Lymph nodes are negative. She will not need any further surgery. Good News!  Call her today and let me know that she is doing OK.  hmi

## 2016-08-12 NOTE — Progress Notes (Deleted)
Please see the Nurse Progress Note in the MD Initial Consult Encounter for this patient. 

## 2016-08-12 NOTE — Progress Notes (Signed)
Location of Breast Cancer:  Right Breast Upper outer Quadrant  FUN  after surgery to move forward with the simulation and planning process and anticipate starting radiotherapy discussion.   Diagnosis 07-31-16 1. Breast, lumpectomy, Right - INVASIVE LOBULAR CARCINOMA, GRADE II/III, SPANNING 2.6 CM. - LOBULAR CARCINOMA IN SITU. - THE SURGICAL RESECTION MARGINS ARE NEGATIVE FOR INVASIVE CARCINOMA. - SEE ONCOLOGY TABLE BELOW. 2. Breast, excision, Re-excision inferomedial border right - FIBROCYSTIC CHANGES. - THERE IS NO EVIDENCE OF MALIGNANCY. - SEE COMMENT. 3. Lymph node, sentinel, biopsy, Right axillary #1 - THERE IS NO EVIDENCE OF CARCINOMA IN 1 OF 1 LYMPH NODE (0/1). - SEE COMMENT. 4. Lymph node, sentinel, biopsy, Right axillary #2 - THERE IS NO EVIDENCE OF CARCINOMA IN 1 OF 1 LYMPH NODE (0/1). - SEE COMMENT. 5. Lymph node, biopsy, Additional right axillary contents - THERE IS NO EVIDENCE OF CARCINOMA IN 1 OF 1 LYMPH NODE (0/1).  Receptor Status: ER(90% +), PR (15% +), Her2-neu (neg. Ratio=1.51), Ki-67(5%)  Histology per Pathology Report:Diagnosis 05/19/2016  Breast, right, needle core biopsy, 11:00 1 cmfn INVASIVE LOBULAR CARCINOMA LOBULAR CARCINOMA IN SITU IS PRESENT  Receptor Status: ER(90%+), PR (15%+, Her2-neu (neg. Ratio=1.51), Ki-67(5%)  Did patient present with symptoms (if so, please note symptoms) or was this found on screening mammography?: Routine screening   Past/Anticipated interventions by surgeon, if any: 07-31-16 Dr. Fanny Skates, MD Right Lumpectomy with Radioactive Seed and sentinel Lymph node biopsy,[place], Dalbert Batman, MD MRI scheduled [name] 06/08/16, wating on that before scheduling surgery,  Appt 06/25/16 with Dr. Dalbert Batman, MD  Past/Anticipated interventions by medical oncology, if any: Chemotherapy : Dr. Jana Hakim, MD appt 06/22/16  Lymphedema issues, if UUE:[facility]  Pain issues, if any: None    SAFETY ISSUES: NO  Prior radiation?  No  Pacemaker/ICD? No Is the patient on methotrexate? No Current Complaints / other details: Anxiety, Married,  4 children,  HX  PVC's ,no HRT,   Mother died CHF and DM, Father died ruptured AAA,Maternal aunt breast cancer,Sisterr/mastectomy  May be due to leaking implants Skin to right breast ,nipple and with mild tanning and axilla with tanning and swelling sensitive and tender to touch. Wt Readings from Last 3 Encounters:  08/13/16 132 lb 9.6 oz (60.1 kg)  07/31/16 131 lb (59.4 kg)  07/23/16 131 lb 4.8 oz (59.6 kg)  BP 140/77   Pulse 90   Temp 98.4 F (36.9 C) (Oral)   Resp 18   Ht _0  (1.651 m)   Wt 132 lb 9.6 oz (60.1 kg)   SpO2 99%   BMI 22.07 kg/m

## 2016-08-13 ENCOUNTER — Ambulatory Visit
Admission: RE | Admit: 2016-08-13 | Discharge: 2016-08-13 | Disposition: A | Discharge status: Home / Self Care | Payer: Medicare Other | Source: Ambulatory Visit | Attending: Radiation Oncology | Admitting: Radiation Oncology

## 2016-08-13 ENCOUNTER — Encounter: Payer: Self-pay | Admitting: Radiation Oncology

## 2016-08-13 VITALS — BP 140/77 | HR 90 | Temp 98.4°F | Resp 18 | Ht 65.0 in | Wt 132.6 lb

## 2016-08-13 DIAGNOSIS — D649 Anemia, unspecified: Secondary | ICD-10-CM | POA: Diagnosis not present

## 2016-08-13 DIAGNOSIS — Z170 Estrogen receptor positive status [ER+]: Secondary | ICD-10-CM

## 2016-08-13 DIAGNOSIS — C50411 Malignant neoplasm of upper-outer quadrant of right female breast: Secondary | ICD-10-CM | POA: Diagnosis not present

## 2016-08-13 DIAGNOSIS — Z888 Allergy status to other drugs, medicaments and biological substances status: Secondary | ICD-10-CM | POA: Diagnosis not present

## 2016-08-13 DIAGNOSIS — Z9889 Other specified postprocedural states: Secondary | ICD-10-CM | POA: Diagnosis not present

## 2016-08-13 DIAGNOSIS — Z510 Encounter for antineoplastic radiation therapy: Secondary | ICD-10-CM | POA: Diagnosis not present

## 2016-08-13 DIAGNOSIS — L03818 Cellulitis of other sites: Secondary | ICD-10-CM | POA: Diagnosis not present

## 2016-08-13 DIAGNOSIS — K219 Gastro-esophageal reflux disease without esophagitis: Secondary | ICD-10-CM | POA: Diagnosis not present

## 2016-08-13 MED ORDER — CEPHALEXIN 500 MG PO CAPS: 500.0000 mg | ORAL_CAPSULE | Freq: Three times a day (TID) | ORAL | 0 refills | Status: AC

## 2016-08-13 NOTE — Progress Notes (Signed)
Radiation Oncology         (336) [phone] ________________________________  Name: Marilyn Haas MRN: [id number]  Date: 08/13/2016  DOB: 1941-01-19  CC:Redmond School, MD  Fanny Skates, MD     REFERRING PHYSICIAN: Fanny Skates, MD   DIAGNOSIS: The primary encounter diagnosis was Malignant neoplasm of upper-outer quadrant of right female breast, unspecified estrogen receptor status ([place]). Diagnoses of Malignant neoplasm of upper-outer quadrant of right breast in female, estrogen receptor positive ([place]) and Cellulitis of other specified site were also pertinent to this visit.   HISTORY OF PRESENT ILLNESS: Marilyn Haas is a 76 y.o. female with a history of right breast cancer. She presented with an abnormal screening mammogram on 04/23/16. Ultrasound on 05/12/16 revealed a 1.4 cm mass at the 11 o'clock position, 1 cm from the nipple in the right breast. 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 Receptor status was ER 90%, PR 15%, Her2-, and Ki67 5%. Bilateral breast MRI on 06/08/16 showed invasive carcinoma in the upper-outer quadrant of the right breast measuring 1.5 x 1.4 x 1.5 cm with additional contiguous non-mass enhancement at the inferolateral aspect of the mass increasing the greatest dimension to 1.9 cm. There was no evidence of malignancy within the left breast.   She underwent right breast lumpectomy with sentinel node evaluation on 07/31/16 which revealed a 2.6 cm, grade 2, ER/PR positive invasive lobular carcinoma with LCIS, 3/3 nodes were negative, and margins were negative, greater than 2 mm from the edge of the specimen. She does not plan to move forward with any chemotherapy and comes today to discuss adjuvant radiotherapy.  PREVIOUS RADIATION THERAPY: No   PAST MEDICAL HISTORY:  Past Medical History:  Diagnosis Date  . Anemia   . Anxiety    takes Xanax as needed  . Arthritis   . Breast cancer ([place]) 05/19/2016   Right breast  .  Childhood asthma   . Chronic back pain    scoliosis,stenosis  . Depression    takes Cymbalta daily  . Diverticulosis   . Family history of adverse reaction to anesthesia    son gets very sick  . GERD (gastroesophageal reflux disease)    not on any meds  . Hard of hearing   . Headache   . History of blood transfusion   . History of colon polyps    benign  . History of hiatal hernia   . Insomnia   . Joint pain   . Osteoporosis   . Pneumonia    20+ yrs ago       PAST SURGICAL HISTORY: Past Surgical History:  Procedure Laterality Date  . BREAST LUMPECTOMY WITH RADIOACTIVE SEED AND SENTINEL LYMPH NODE BIOPSY Right 07/31/2016   Procedure: BREAST LUMPECTOMY WITH RADIOACTIVE SEED AND SENTINEL LYMPH NODE BIOPSY, INJECT BLUE DYE RIGHT BREAST;  Surgeon: Fanny Skates, MD;  Location: [facility];  Service: General;  Laterality: Right;  . COLONOSCOPY    . 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 ESOPHAGOGASTRODUODENOSCOPY N/A 01/26/2014   Procedure: ESOPHAGOGASTRODUODENOSCOPY (EGD);  Surgeon: Rogene Houston, MD;  [facility];  Service: Endoscopy;  Laterality: N/A;  1030  . HIP FRACTURE SURGERY     05/2009 left -Mayer Camel  . MALONEY DILATION N/A 01/26/2014   Procedure: Venia Minks DILATION;  Surgeon: Rogene Houston, MD;  [facility];  Service: Endoscopy;  Laterality: N/A;     FAMILY HISTORY:  Family History  Problem Relation Age of Onset  . Heart failure Mother   . Diabetes Mother   . Hypertension Mother   . Hypertension Sister   . Heart disease Sister        Premature CAD  . Cancer Maternal Aunt        breast     SOCIAL HISTORY:  reports that she quit smoking about 55 years ago. Her smoking use included Cigarettes. She has a 1.00 pack-year smoking history. She has never used smokeless tobacco. She reports that she does  not drink alcohol or use drugs.  The patient is married and lives in [place]. Her husband has dementia and is going to proceed with treatment for pancreatic cancer, and she is accompanied by her sister in law for today's visit.   ALLERGIES: Avelox [moxifloxacin hcl in nacl]   MEDICATIONS:  Current Outpatient Prescriptions  Medication Sig Dispense Refill  . ALPRAZolam (XANAX) 0.5 MG tablet Take 0.5 mg by mouth at bedtime as needed for anxiety.    . DULoxetine (CYMBALTA) 60 MG capsule Take 60 mg by mouth every morning.     Marland Kitchen HYDROcodone-acetaminophen (NORCO) 5-325 MG tablet Take 1 tablet by mouth every 6 (six) hours as needed for moderate pain or severe pain. 15 tablet 0  . HYDROcodone-acetaminophen (NORCO/VICODIN) 5-325 MG tablet Take 1 tablet by mouth 3 (three) times daily.    . cephALEXin (KEFLEX) 500 MG capsule Take 1 capsule (500 mg total) by mouth 3 (three) times daily. 21 capsule 0   No current facility-administered medications for this encounter.      REVIEW OF SYSTEMS: On review of systems, the patient reports that she is doing well overall. She reports her healing is going well but she reports some burning sensation along her right upper arm since her lymph node evaluation. She also reports that she's had some fullness in her breast on the right. She denies any chest pain, shortness of breath, cough, fevers, chills, night sweats, unintended weight changes. She denies any bowel or bladder disturbances, and denies abdominal pain, nausea or vomiting. She denies any new musculoskeletal or joint aches or pains, new skin lesions or concerns. A complete review of systems is obtained and is otherwise negative.      PHYSICAL EXAM:  Wt Readings from Last 3 Encounters:  08/13/16 132 lb 9.6 oz (60.1 kg)  07/31/16 131 lb (59.4 kg)  07/23/16 131 lb 4.8 oz (59.6 kg)   Temp Readings from Last 3 Encounters:  08/13/16 98.4 F (36.9 C) (Oral)  07/31/16 97.6 F (36.4 C)  07/23/16 98.4 F  (36.9 C)   BP Readings from Last 3 Encounters:  08/13/16 140/77  07/31/16 (!) 146/81  07/23/16 (!) 153/77   Pulse Readings from Last 3 Encounters:  08/13/16 90  07/31/16 80  07/23/16 83   Pain Assessment Pain Score: 0-No pain  In general this is a well appearing caucasian female in no acute distress. She's alert and oriented x4 and appropriate throughout the examination. Cardiopulmonary assessment is negative for acute distress and she exhibits normal effort. Breast exam reveals a well healed axillary and lumpectomy scar. There is erythema along the lower have of the breast beneath the incision without punctate lesions or separation. No fullness is noted along the chest wall.    ECOG = 1  0 - Asymptomatic (Fully active, able to carry on all predisease activities without restriction)  1 - Symptomatic but completely ambulatory (Restricted in physically strenuous activity but ambulatory and able to carry out work of a light or sedentary nature. For example, light housework, office work)  2 - Symptomatic, <50% in bed during the day (Ambulatory and capable of all self care but unable to carry out any work activities. Up and about more than 50% of waking hours)  3 - Symptomatic, >50% in bed, but not bedbound (Capable of only limited self-care, confined to bed or chair 50% or more of waking hours)  4 - Bedbound (Completely disabled. Cannot carry on any self-care. Totally confined to bed or chair)  5 - Death   Eustace Pen MM, Creech RH, Tormey DC, et al. [phone]). "Toxicity and response criteria of the [facility]". [place] Oncol. 5 (6): 649-55    LABORATORY DATA:  Lab Results  Component Value Date   WBC 5.8 07/23/2016   HGB 10.8 (L) 07/23/2016   HCT 33.2 (L) 07/23/2016   MCV 92.7 07/23/2016   PLT 254 07/23/2016   Lab Results  Component Value Date   NA 135 07/23/2016   K 3.6 07/23/2016   CL 100 (L) 07/23/2016   CO2 26 07/23/2016   Lab Results    Component Value Date   ALT 14 07/31/2015   AST 20 07/31/2015   ALKPHOS 69 07/31/2015   BILITOT 0.3 07/31/2015      RADIOGRAPHY: Mm Breast Surgical Specimen  Result Date: 07/31/2016 CLINICAL DATA:  Post right breast excision. EXAM: SPECIMEN RADIOGRAPH OF THE RIGHT BREAST COMPARISON:  Previous exam(s). FINDINGS: Status post excision of the right breast. The radioactive seed and biopsy marker clip are present, completely intact, and were marked for pathology. Distortion and calcifications are visualized in the specimen radiograph. IMPRESSION: Specimen radiograph of the right breast. Electronically Signed   By: Everlean Alstrom M.D.   On: 07/31/2016 10:09   [place] Rt Radioactive Seed Loc  Result Date: 07/30/2016 CLINICAL DATA:  Right breast 11 o'clock invasive ductal carcinoma. EXAM: ULTRASOUND GUIDED RADIOACTIVE SEED LOCALIZATION OF THE RIGHT BREAST COMPARISON:  Previous exam(s). FINDINGS: Patient presents for radioactive seed localization prior to right breast lumpectomy. I met with the patient and we discussed the procedure of seed localization including benefits and alternatives. We discussed the high likelihood of a successful procedure. We discussed the risks of the procedure including infection, bleeding, tissue injury and further surgery. We discussed the low dose of radioactivity involved in the procedure. Informed, written consent was given. The usual time-out protocol was performed immediately prior to the procedure. Using ultrasound guidance, sterile technique, 1% lidocaine and an I-125 radioactive seed, right breast 11 o'clock mass was localized using a medial approach. The follow-up mammogram images confirm the seed in the expected location and were marked for Dr. Dalbert Batman. Follow-up survey of the patient confirms presence of the radioactive seed. Order number of I-125 seed:  [id number]. Total activity: 4.580 millicurie Reference Date: Jul 28, 2016 The patient tolerated the procedure well and was  released from the [place]. She was given instructions regarding seed removal. IMPRESSION: Radioactive seed localization right breast. No apparent complications. Electronically Signed   By: Fidela Salisbury M.D.   On: 07/30/2016 14:24   Mm Clip Placement Right  Result Date: 07/30/2016 CLINICAL DATA:  Post radioactive seed placement within right breast 11 o'clock mass. EXAM: DIAGNOSTIC RIGHT MAMMOGRAM POST ULTRASOUND-GUIDED RADIOACTIVE SEED PLACEMENT SCRATCHED COMPARISON:  Previous exam(s). FINDINGS: Mammographic images were obtained following ultrasound-guided radioactive seed placement. These demonstrate presence  of radioactive seed within the right breast 11 o'clock mass. The radioactive seed is located 1.3 cm superior and 6 mm posterior to the previously placed post biopsy tissue marker. IMPRESSION: Appropriate location of the radioactive seed. Final Assessment: Post Procedure Mammograms for Seed Placement Electronically Signed   By: Fidela Salisbury M.D.   On: 07/30/2016 14:29       IMPRESSION/PLAN: 1. Stage IA, pT2N0Mx ER/PR positive, invasive lobular carcinoma of the right breast. Dr. Lisbeth Renshaw discusses the pathology findings and reviews the nature of invasive lobular disease. We discussed that she has healed well and her course will now be to move forward with adjvuant external radiotherapy to the breast followed by antiestrogen therapy. We discussed the risks, benefits, short, and long term effects of radiotherapy, and the patient is interested in proceeding. Dr. Lisbeth Renshaw discusses the delivery and logistics of radiotherapy, and recommends a course of 4 weeks of treatment. We will move forward with simulation on Tuesday, August 18, 2016 at 11 am. Written consent is obtained and placed in the chart, a copy provided to the patient. 2. Cellulitis. The patient has early findings of cellulitis. Given the time of day and distance the patient lives, I'll go ahead and start Keflex and notify Dr. Dalbert Batman  as well. She will complete a course of this and see him back next Friday for postop check.   In a visit lasting 45  minutes, greater than 50% of the time was spent face to face discussing options of radiotherapy, and coordinating the patient's care.  The above documentation reflects my direct findings during this shared patient visit. Please see the separate note by Dr. Lisbeth Renshaw on this date for the remainder of the patient's plan of care.    Carola Rhine, PAC  This document serves as a record of services personally performed by Kyung Rudd, MD and Shona Simpson, PA-C. It was created on their behalf by Bethann Humble, a trained medical scribe. The creation of this record is based on the scribe's personal observations and the provider's statements to them. This document has been checked and approved by the attending provider.

## 2016-08-18 ENCOUNTER — Ambulatory Visit
Admission: RE | Admit: 2016-08-18 | Discharge: 2016-08-18 | Disposition: A | Discharge status: Home / Self Care | Payer: Medicare Other | Source: Ambulatory Visit | Attending: Radiation Oncology | Admitting: Radiation Oncology

## 2016-08-18 DIAGNOSIS — C50411 Malignant neoplasm of upper-outer quadrant of right female breast: Secondary | ICD-10-CM | POA: Diagnosis not present

## 2016-08-18 DIAGNOSIS — Z170 Estrogen receptor positive status [ER+]: Secondary | ICD-10-CM

## 2016-08-18 DIAGNOSIS — Z510 Encounter for antineoplastic radiation therapy: Secondary | ICD-10-CM | POA: Diagnosis not present

## 2016-08-18 DIAGNOSIS — Z888 Allergy status to other drugs, medicaments and biological substances status: Secondary | ICD-10-CM | POA: Diagnosis not present

## 2016-08-18 DIAGNOSIS — D649 Anemia, unspecified: Secondary | ICD-10-CM | POA: Diagnosis not present

## 2016-08-18 DIAGNOSIS — K219 Gastro-esophageal reflux disease without esophagitis: Secondary | ICD-10-CM | POA: Diagnosis not present

## 2016-08-18 NOTE — Progress Notes (Signed)
  Radiation Oncology         (336) [phone] ________________________________  Name: Marilyn Haas MRN: [id number]  Date: 08/18/2016  DOB: 1940/08/26  Optical Surface Tracking Plan:  Since intensity modulated radiotherapy (IMRT) and 3D conformal radiation treatment methods are predicated on accurate and precise positioning for treatment, intrafraction motion monitoring is medically necessary to ensure accurate and safe treatment delivery.  The ability to quantify intrafraction motion without excessive ionizing radiation dose can only be performed with optical surface tracking. Accordingly, surface imaging offers the opportunity to obtain 3D measurements of patient position throughout IMRT and 3D treatments without excessive radiation exposure.  I am ordering optical surface tracking for this patient's upcoming course of radiotherapy. ________________________________  Kyung Rudd, MD 08/18/2016 5:02 PM    Reference:   Particia Jasper, et al. Surface imaging-based analysis of intrafraction motion for breast radiotherapy patients.Journal of [place], n. 6, nov. 2014. ISSN 75797282.   Available at: <http://www.jacmp.org/index.php/jacmp/article/view/4957>.

## 2016-08-18 NOTE — Progress Notes (Signed)
  Radiation Oncology         (336) [phone] ________________________________  Name: Marilyn Haas MRN: [id number]  Date: 08/18/2016  DOB: 12-14-1940   DIAGNOSIS:     ICD-9-CM ICD-10-CM   1. Malignant neoplasm of upper-outer quadrant of right breast in female, estrogen receptor positive ([place]) 174.4 C50.411    V86.0 Z17.0     SIMULATION AND TREATMENT PLANNING NOTE  The patient presented for simulation prior to beginning her course of radiation treatment for her diagnosis of right-sided breast cancer. The patient was placed in a supine position on a breast board. A customized vac-lock bag was constructed and this complex treatment device will be used on a daily basis during her treatment. In this fashion, a CT scan was obtained through the chest area and an isocenter was placed near the chest wall within the breast.  The patient will be planned to receive a course of radiation initially to a dose of 42.5 Gy. This will consist of a whole breast radiotherapy technique. To accomplish this, 2 customized blocks have been designed which will correspond to medial and lateral whole breast tangent fields. This treatment will be accomplished at 2.5 Gy per fraction. A forward planning technique will also be evaluated to determine if this approach improves the plan. It is anticipated that the patient will then receive a 7.5 Gy boost to the seroma cavity which has been contoured. This will be accomplished at 2.5 Gy per fraction.   This initial treatment will consist of a 3-D conformal technique. The seroma has been contoured as the primary target structure. Additionally, dose volume histograms of both this target as well as the lungs and heart will also be evaluated. Such an approach is necessary to ensure that the target area is adequately covered while the nearby critical  normal structures are adequately spared.  Plan:  The final anticipated total dose therefore will correspond to 50  Gy.    _______________________________   Jodelle Gross, MD, PhD

## 2016-08-25 ENCOUNTER — Ambulatory Visit: Payer: Medicare Other | Admitting: Oncology

## 2016-08-26 ENCOUNTER — Ambulatory Visit: Payer: Medicare Other | Admitting: Oncology

## 2016-08-26 DIAGNOSIS — Z510 Encounter for antineoplastic radiation therapy: Secondary | ICD-10-CM | POA: Diagnosis not present

## 2016-08-26 DIAGNOSIS — K219 Gastro-esophageal reflux disease without esophagitis: Secondary | ICD-10-CM | POA: Diagnosis not present

## 2016-08-26 DIAGNOSIS — Z170 Estrogen receptor positive status [ER+]: Secondary | ICD-10-CM | POA: Diagnosis not present

## 2016-08-26 DIAGNOSIS — Z888 Allergy status to other drugs, medicaments and biological substances status: Secondary | ICD-10-CM | POA: Diagnosis not present

## 2016-08-26 DIAGNOSIS — D649 Anemia, unspecified: Secondary | ICD-10-CM | POA: Diagnosis not present

## 2016-08-26 DIAGNOSIS — C50411 Malignant neoplasm of upper-outer quadrant of right female breast: Secondary | ICD-10-CM | POA: Diagnosis not present

## 2016-08-31 ENCOUNTER — Ambulatory Visit: Payer: Medicare Other | Admitting: Oncology

## 2016-08-31 ENCOUNTER — Ambulatory Visit
Admission: RE | Admit: 2016-08-31 | Discharge: 2016-08-31 | Disposition: A | Discharge status: Home / Self Care | Payer: Medicare Other | Source: Ambulatory Visit | Attending: Radiation Oncology | Admitting: Radiation Oncology

## 2016-08-31 DIAGNOSIS — K219 Gastro-esophageal reflux disease without esophagitis: Secondary | ICD-10-CM | POA: Diagnosis not present

## 2016-08-31 DIAGNOSIS — D649 Anemia, unspecified: Secondary | ICD-10-CM | POA: Diagnosis not present

## 2016-08-31 DIAGNOSIS — Z170 Estrogen receptor positive status [ER+]: Secondary | ICD-10-CM | POA: Diagnosis not present

## 2016-08-31 DIAGNOSIS — Z888 Allergy status to other drugs, medicaments and biological substances status: Secondary | ICD-10-CM | POA: Diagnosis not present

## 2016-08-31 DIAGNOSIS — Z510 Encounter for antineoplastic radiation therapy: Secondary | ICD-10-CM | POA: Diagnosis not present

## 2016-08-31 DIAGNOSIS — C50411 Malignant neoplasm of upper-outer quadrant of right female breast: Secondary | ICD-10-CM | POA: Diagnosis not present

## 2016-09-01 ENCOUNTER — Ambulatory Visit (HOSPITAL_BASED_OUTPATIENT_CLINIC_OR_DEPARTMENT_OTHER): Payer: Medicare Other | Admitting: Oncology

## 2016-09-01 ENCOUNTER — Ambulatory Visit
Admission: RE | Admit: 2016-09-01 | Discharge: 2016-09-01 | Disposition: A | Discharge status: Home / Self Care | Payer: Medicare Other | Source: Ambulatory Visit | Attending: Radiation Oncology | Admitting: Radiation Oncology

## 2016-09-01 ENCOUNTER — Telehealth: Payer: Self-pay | Admitting: Oncology

## 2016-09-01 VITALS — BP 153/82 | HR 79 | Temp 98.2°F | Resp 18 | Ht 65.0 in | Wt 129.8 lb

## 2016-09-01 DIAGNOSIS — Z170 Estrogen receptor positive status [ER+]: Secondary | ICD-10-CM | POA: Diagnosis not present

## 2016-09-01 DIAGNOSIS — D649 Anemia, unspecified: Secondary | ICD-10-CM | POA: Diagnosis not present

## 2016-09-01 DIAGNOSIS — Z888 Allergy status to other drugs, medicaments and biological substances status: Secondary | ICD-10-CM | POA: Diagnosis not present

## 2016-09-01 DIAGNOSIS — Z510 Encounter for antineoplastic radiation therapy: Secondary | ICD-10-CM | POA: Diagnosis not present

## 2016-09-01 DIAGNOSIS — C50411 Malignant neoplasm of upper-outer quadrant of right female breast: Secondary | ICD-10-CM

## 2016-09-01 DIAGNOSIS — Z79810 Long term (current) use of selective estrogen receptor modulators (SERMs): Secondary | ICD-10-CM | POA: Diagnosis not present

## 2016-09-01 DIAGNOSIS — K219 Gastro-esophageal reflux disease without esophagitis: Secondary | ICD-10-CM | POA: Diagnosis not present

## 2016-09-01 MED ORDER — TAMOXIFEN CITRATE 20 MG PO TABS: 20.0000 mg | ORAL_TABLET | Freq: Every day | ORAL | 12 refills | Status: AC

## 2016-09-01 MED ORDER — ALRA NON-METALLIC DEODORANT (RAD-ONC): 1.0000 "application " | Freq: Once | TOPICAL | Status: DC

## 2016-09-01 MED ORDER — RADIAPLEXRX EX GEL: Freq: Once | CUTANEOUS | Status: DC

## 2016-09-01 MED ORDER — KETOCONAZOLE 2 % EX CREA: 1.0000 "application " | TOPICAL_CREAM | Freq: Every day | CUTANEOUS | 0 refills | Status: DC

## 2016-09-01 NOTE — Progress Notes (Signed)
[facility]  Telephone:(336) [phone] Fax:(336) [phone]     ID: Marilyn Haas DOB: [birth year]  MR#: [id number]  MWN#:[id number]  Patient Care Team: Marilyn Nevins, MD as PCP - General (Internal Medicine) Marilyn Haas, Marilyn Hue, MD as Consulting Physician (Oncology) Marilyn Kelp, MD as Consulting Physician (General Surgery) Marilyn Puffer, MD as Consulting Physician (Radiation Oncology) Marilyn Hippo, MD as Consulting Physician (Gastroenterology) Marilyn Gross, MD as Consulting Physician (Orthopedic Surgery) Marilyn Dell, MD OTHER MD:  CHIEF COMPLAINT: Estrogen receptor positive lobular breast cancer  CURRENT TREATMENT: Tamoxifen  BREAST CANCER HISTORY: From the original intake note:  The patient had bilateral screening mammography at the [facility] 04/22/2016 showing an area of possible asymmetry and calcifications in the right breast. Right diagnostic mammography with tomography and right breast ultrasonography 05/12/2016 showed the breast density to be category C. In the right breast upper outer quadrant there was a. A real lower area of architectural distortion which was palpable in the 11:00 radians 1 cm from the nipple. Ultrasonography confirmed an irregular hypoechoic mass at this location measuring 1.4 cm. Ultrasound of the right axilla was unremarkable.  Biopsy of the right breast mass in question 05/19/2016 showed ([id number]) and invasive lobular carcinoma, E-cadherin negative, estrogen receptor 90% positive, progesterone receptor 15% positive, both with strong staining intensity, with an MIB-1 of 5%, and no HER-2 amplification, the signals ratio being 1.51 and the number per cell 3.10.  Breast MRI 06/08/2016 confirmed an irregular enhancing mass in the upper-outer quadrant of the right breast measuring 1.5 cm, with adjacent biopsy clip artifact. There was an area of contiguous non-mass like enhancement bringing the total dimension to 1.9 cm. However  there was no evidence of multicentric disease and no evidence of metastatic lymphadenopathy.  The patient's subsequent history is as detailed below   INTERVAL HISTORY: Marilyn Haas returns today for follow-up and treatment of her estrogen receptor positive breast cancer accompanied by her son Marilyn Haas. Since the last visit here she underwent right lumpectomy and sentinel lymph node sampling, on 07/31/2016. This found an invasive lobular breast cancer, grade 2, measuring 2.6 cm. Margins were negative. All 3 sentinel lymph nodes were clear.  She has since started her radiation treatments, scheduled to be completed 09/29/2016  The overall situation is complicated by the fact that the patient's husband has been found to be pancreatic cancer. [name] is a primary caregiver about her children are very involved in the care of both her parents.  REVIEW OF SYSTEMS: She did very well with her surgery, with no pain or bleeding. She had very minimal cellulitis in the surgical breast which was treated with Keflex and resolved. She is tolerating radiation well so far. She feels she is back to her usual functional status. A detailed review of systems today was otherwise stable.   PAST MEDICAL HISTORY: Past Medical History:  Diagnosis Date  . Anemia   . Anxiety    takes Xanax as needed  . Arthritis   . Breast cancer (HCC) 05/19/2016   Right breast  . Childhood asthma   . Chronic back pain    scoliosis,stenosis  . Depression    takes Cymbalta daily  . Diverticulosis   . Family history of adverse reaction to anesthesia    son gets very sick  . GERD (gastroesophageal reflux disease)    not on any meds  . Hard of hearing   . Headache   . History of blood transfusion   . History of colon polyps  benign  . History of hiatal hernia   . Insomnia   . Joint pain   . Osteoporosis   . Pneumonia    20+ yrs ago    PAST SURGICAL HISTORY: Past Surgical History:  Procedure Laterality Date  . BREAST  LUMPECTOMY WITH RADIOACTIVE SEED AND SENTINEL LYMPH NODE BIOPSY Right 07/31/2016   Procedure: BREAST LUMPECTOMY WITH RADIOACTIVE SEED AND SENTINEL LYMPH NODE BIOPSY, INJECT BLUE DYE RIGHT BREAST;  Surgeon: Marilyn Kelp, MD;  Location: [facility] OR;  Service: General;  Laterality: Right;  . COLONOSCOPY    . 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 07/04/2014   Procedure: LEFT CONVERSION TO TOTAL HIP ARTHROPLASTY;  Surgeon: Marilyn Gross, MD;  Location: [facility] ORS;  Service: Orthopedics;  Laterality: Left;  . ESOPHAGOGASTRODUODENOSCOPY N/A 01/26/2014   Procedure: ESOPHAGOGASTRODUODENOSCOPY (EGD);  Surgeon: Marilyn Hippo, MD;  Location: [facility] ENDO SUITE;  Service: Endoscopy;  Laterality: N/A;  1030  . HIP FRACTURE SURGERY     05/2009 left -Turner Daniels  . MALONEY DILATION N/A 01/26/2014   Procedure: Marilyn Haas DILATION;  Surgeon: Marilyn Hippo, MD;  Location: [facility] ENDO SUITE;  Service: Endoscopy;  Laterality: N/A;    FAMILY HISTORY Family History  Problem Relation Age of Onset  . Heart failure Mother   . Diabetes Mother   . Hypertension Mother   . Hypertension Sister   . Heart disease Sister        Premature CAD  . Cancer Maternal Aunt        breast  The patient's father died at age 22 from rupture of an abdominal aortic aneurysm. The patient's mother died with heart failure in the setting of diabetes at age 54. The patient had no brothers, 2 sisters. One sister was diagnosed with breast cancer at age 52. One maternal aunt was diagnosed with breast cancer in her 77s  GYNECOLOGIC HISTORY:  No LMP recorded. Patient is postmenopausal. Menarche age 72, first live birth age 85, which the patient is aware increases the risk of breast cancer. She is GX P3. She does not quite recall when she went through the change of life but thinks he was in her 36s. She did not use hormone replacement.  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 Her husband"Marilyn" Haas, 51, was in sales. She is his primary caregiver. There has been some  mental decline recently. Son Marilyn Haas lives in [place] where he works in Armed forces training and education officer. Son Marilyn Haas lives in [place] and works in Magazine features editor. Son Cletis Athens lives in [place] and works as a Curator. The patient has 12 grandchildren and 2 great-grandchildren. She is a Control and instrumentation engineer.     ADVANCED DIRECTIVES: Not addressed at 06/22/2016 meeting   HEALTH MAINTENANCE: Social History  Substance Use Topics  . Smoking status: Former Smoker    Packs/day: 0.50    Years: 2.00    Types: Cigarettes    Quit date: 01/15/1961  . Smokeless tobacco: Never Used  . Alcohol use No     Colonoscopy: UTD/Rehman  PAP: remote  Bone density: 04/22/2016   Allergies  Allergen Reactions  . Avelox [Moxifloxacin Hcl In Nacl] Other (See Comments)    Altered mental status    Current Outpatient Prescriptions  Medication Sig Dispense Refill  . ALPRAZolam (XANAX) 0.5 MG tablet Take 0.5 mg by mouth at bedtime as needed for anxiety.    . DULoxetine (CYMBALTA) 60 MG capsule Take 60 mg by mouth every morning.     . hyaluronate sodium (RADIAPLEXRX) GEL Apply 1  application topically 2 (two) times daily.    Marland Kitchen ketoconazole (NIZORAL) 2 % cream Apply 1 application topically daily. 15 g 0  . non-metallic deodorant (ALRA) MISC Apply 1 application topically.     No current facility-administered medications for this visit.    Facility-Administered Medications Ordered in Other Visits  Medication Dose Route Frequency Provider Last Rate Last Dose  . hyaluronate sodium (RADIAPLEXRX) gel   Topical Once Ronny Bacon, PA-C      . non-metallic deodorant Thornton Papas) 1 application  1 application Topical Once Ronny Bacon, PA-C        OBJECTIVE: Older white womanWho appears stated age  Vitals:   09/01/16 1143  BP: (!) 153/82  Pulse: 79  Resp: 18  Temp: 98.2 F (36.8 C)     Body mass index is 21.6 kg/m.    ECOG FS:1 - Symptomatic but completely ambulatory  Sclerae unicteric, EOMs intact Oropharynx clear  and moist No cervical or supraclavicular adenopathy Lungs no rales or rhonchi Heart regular rate and rhythm Abd soft, nontender, positive bowel sounds MSK no focal spinal tenderness, no upper extremity lymphedema Neuro: nonfocal, well oriented, appropriate affect Breasts: The right breast is status Haas recent surgery and is currently undergoing radiation. The cosmetic result is good. There is no significant erythema. The left breast is unremarkable. Both axillae are benign.  LAB RESULTS:  CMP     Component Value Date/Time   NA 135 07/23/2016 1352   K 3.6 07/23/2016 1352   CL 100 (L) 07/23/2016 1352   CO2 26 07/23/2016 1352   GLUCOSE 79 07/23/2016 1352   BUN 14 07/23/2016 1352   CREATININE 0.92 07/23/2016 1352   CREATININE 0.77 12/13/2014 1132   CALCIUM 8.7 (L) 07/23/2016 1352   PROT 7.1 07/31/2015 1932   PROT 7.2 01/23/2015 1233   ALBUMIN 4.1 07/31/2015 1932   ALBUMIN 4.5 01/23/2015 1233   AST 20 07/31/2015 1932   ALT 14 07/31/2015 1932   ALKPHOS 69 07/31/2015 1932   BILITOT 0.3 07/31/2015 1932   BILITOT 0.4 01/23/2015 1233   GFRNONAA 59 (L) 07/23/2016 1352   GFRAA >60 07/23/2016 1352    No results found for: TOTALPROTELP, ALBUMINELP, A1GS, A2GS, BETS, BETA2SER, GAMS, MSPIKE, SPEI  No results found for: KPAFRELGTCHN, LAMBDASER, KAPLAMBRATIO  Lab Results  Component Value Date   WBC 5.8 07/23/2016   NEUTROABS 4.0 10/11/2015   HGB 10.8 (L) 07/23/2016   HCT 33.2 (L) 07/23/2016   MCV 92.7 07/23/2016   PLT 254 07/23/2016      Chemistry      Component Value Date/Time   NA 135 07/23/2016 1352   K 3.6 07/23/2016 1352   CL 100 (L) 07/23/2016 1352   CO2 26 07/23/2016 1352   BUN 14 07/23/2016 1352   CREATININE 0.92 07/23/2016 1352   CREATININE 0.77 12/13/2014 1132      Component Value Date/Time   CALCIUM 8.7 (L) 07/23/2016 1352   ALKPHOS 69 07/31/2015 1932   AST 20 07/31/2015 1932   ALT 14 07/31/2015 1932   BILITOT 0.3 07/31/2015 1932   BILITOT 0.4 01/23/2015  1233       No results found for: LABCA2  No components found for: [id number]  No results for input(s): INR in the last 168 hours.  Urinalysis    Component Value Date/Time   COLORURINE YELLOW 10/11/2015 1211   APPEARANCEUR CLEAR 10/11/2015 1211   LABSPEC <1.005 (L) 10/11/2015 1211   PHURINE 6.5 10/11/2015 1211   GLUCOSEU 100 (A) 10/11/2015  1211   HGBUR TRACE (A) 10/11/2015 1211   BILIRUBINUR NEGATIVE 10/11/2015 1211   KETONESUR NEGATIVE 10/11/2015 1211   PROTEINUR NEGATIVE 10/11/2015 1211   UROBILINOGEN 1.0 06/26/2014 1400   NITRITE NEGATIVE 10/11/2015 1211   LEUKOCYTESUR NEGATIVE 10/11/2015 1211     STUDIES: No results found.  ELIGIBLE FOR AVAILABLE RESEARCH PROTOCOL: no  ASSESSMENT: 76 y.o. [place] woman status Haas right breast upper outer quadrant biopsy 05/19/2016 for a clinical T1c N0, stage IA invasive lobular breast cancer, estrogen and progesterone receptor positive, HER-2 nonamplified, with an MIB-15%.   (1) Status Haas right lumpectomy and sentinel lymph node sampling 07/31/2016 for a pT2 pN0, stage IB invasive lobular carcinoma, grade 2, with negative margins  (2) adjuvant radiation in process  (3) to start tamoxifen 10/26/2016  (a) bone density 04/22/2016 shows a T score in the right femur of -2.0, right radius -2.4   PLAN: [name] is tolerating the radiation well and she did very well with her surgery. We reviewed her pathology, and I gave her a copy of the report as well as interpreting it.  She understands that she has a good prognosis without chemotherapy assuming she takes antiestrogen's for 5 years.  @PATIENTFIRSTNAME @   has completed her local treatment and is now ready to start anti-estrogens.  We discussed the difference between tamoxifen and anastrozole in detail. She understands that anastrozole and the aromatase inhibitors in general work by blocking estrogen production. Accordingly vaginal dryness, decrease in bone density, and of course  hot flashes can result. The aromatase inhibitors can also negatively affect the cholesterol profile, although that is a minor effect. One out of 5 women on aromatase inhibitors we will feel "old and achy". This arthralgia/myalgia syndrome, which resembles fibromyalgia clinically, does resolve with stopping the medications. Accordingly this is not a reason to not try an aromatase inhibitor but it is a frequent reason to stop it (in other words 20% of women will not be able to tolerate these medications).  Tamoxifen on the other hand does not block estrogen production. It does not "take away a woman's estrogen". It blocks the estrogen receptor in breast cells. Like anastrozole, it can also cause hot flashes. As opposed to anastrozole, tamoxifen has many estrogen-like effects. It is technically an estrogen receptor modulator. This means that in some tissues tamoxifen works like estrogen-- for example it helps strengthen the bones. It tends to improve the cholesterol profile. It can cause thickening of the endometrial lining, and even endometrial polyps or rarely cancer of the uterus.(The risk of uterine cancer due to tamoxifen is one additional cancer per thousand women year). It can cause vaginal wetness or stickiness. It can cause blood clots through this estrogen-like effect--the risk of blood clots with tamoxifen is exactly the same as with birth control pills or hormone replacement.  Neither of these agents causes mood changes or weight gain, despite the popular belief that they can have these side effects. We have data from studies comparing either of these drugs with placebo, and in those cases the control group had the same amount of weight gain and depression as the group that took the drug.  After all this discussion and with information given to her in writing she feels tamoxifen is what she would like to try. I have gone ahead and given her the prescription but asked her not to start it until  mid-August so she can recover from her radiation and not confused the side effects. Situation with her husband also makes  this transition difficult.  Since symptoms may take some time to develop she will see me again in October. She knows to call for any problems that may develop before that visit.   :Marilyn Dell, MD   09/01/2016 8:16 PM Medical Oncology and Hematology [facility] [address] [place], [place] [address] Tel. [phone]    Fax. [phone]

## 2016-09-01 NOTE — Progress Notes (Signed)
Pt education done,my business card, radiation therapy and you book, alra,radiaplex given,discussed side effects, fatigue, skin irritation,, short sharp pains in breast,,swelling, , luke warm showers, no rubbing,scrubbing or scratching area treated, pat dry, no shaving unless electric shaver, increase protein in diet and stay hydrated, drink plenty water, no under wire bras, sees MD Fridays and prn, knows to call and leave name ,dob if cancelling treatment for the day, teach back given

## 2016-09-01 NOTE — Telephone Encounter (Signed)
Scheduled appt per 6/19 los - no PM appt available . Gave patient AVS and calender.

## 2016-09-02 ENCOUNTER — Ambulatory Visit
Admission: RE | Admit: 2016-09-02 | Discharge: 2016-09-02 | Disposition: A | Discharge status: Home / Self Care | Payer: Medicare Other | Source: Ambulatory Visit | Attending: Radiation Oncology | Admitting: Radiation Oncology

## 2016-09-02 DIAGNOSIS — D649 Anemia, unspecified: Secondary | ICD-10-CM | POA: Diagnosis not present

## 2016-09-02 DIAGNOSIS — E538 Deficiency of other specified B group vitamins: Secondary | ICD-10-CM | POA: Diagnosis not present

## 2016-09-02 DIAGNOSIS — K219 Gastro-esophageal reflux disease without esophagitis: Secondary | ICD-10-CM | POA: Diagnosis not present

## 2016-09-02 DIAGNOSIS — Z6823 Body mass index (BMI) 23.0-23.9, adult: Secondary | ICD-10-CM | POA: Diagnosis not present

## 2016-09-02 DIAGNOSIS — Z1389 Encounter for screening for other disorder: Secondary | ICD-10-CM | POA: Diagnosis not present

## 2016-09-02 DIAGNOSIS — Z888 Allergy status to other drugs, medicaments and biological substances status: Secondary | ICD-10-CM | POA: Diagnosis not present

## 2016-09-02 DIAGNOSIS — Z510 Encounter for antineoplastic radiation therapy: Secondary | ICD-10-CM | POA: Diagnosis not present

## 2016-09-02 DIAGNOSIS — F419 Anxiety disorder, unspecified: Secondary | ICD-10-CM | POA: Diagnosis not present

## 2016-09-02 DIAGNOSIS — Z170 Estrogen receptor positive status [ER+]: Secondary | ICD-10-CM | POA: Diagnosis not present

## 2016-09-02 DIAGNOSIS — F329 Major depressive disorder, single episode, unspecified: Secondary | ICD-10-CM | POA: Diagnosis not present

## 2016-09-02 DIAGNOSIS — C50411 Malignant neoplasm of upper-outer quadrant of right female breast: Secondary | ICD-10-CM | POA: Diagnosis not present

## 2016-09-02 DIAGNOSIS — G894 Chronic pain syndrome: Secondary | ICD-10-CM | POA: Diagnosis not present

## 2016-09-03 ENCOUNTER — Ambulatory Visit
Admission: RE | Admit: 2016-09-03 | Discharge: 2016-09-03 | Disposition: A | Discharge status: Home / Self Care | Payer: Medicare Other | Source: Ambulatory Visit | Attending: Radiation Oncology | Admitting: Radiation Oncology

## 2016-09-03 DIAGNOSIS — Z888 Allergy status to other drugs, medicaments and biological substances status: Secondary | ICD-10-CM | POA: Diagnosis not present

## 2016-09-03 DIAGNOSIS — C50411 Malignant neoplasm of upper-outer quadrant of right female breast: Secondary | ICD-10-CM | POA: Diagnosis not present

## 2016-09-03 DIAGNOSIS — D649 Anemia, unspecified: Secondary | ICD-10-CM | POA: Diagnosis not present

## 2016-09-03 DIAGNOSIS — Z510 Encounter for antineoplastic radiation therapy: Secondary | ICD-10-CM | POA: Diagnosis not present

## 2016-09-03 DIAGNOSIS — K219 Gastro-esophageal reflux disease without esophagitis: Secondary | ICD-10-CM | POA: Diagnosis not present

## 2016-09-03 DIAGNOSIS — Z170 Estrogen receptor positive status [ER+]: Secondary | ICD-10-CM | POA: Diagnosis not present

## 2016-09-04 ENCOUNTER — Ambulatory Visit
Admission: RE | Admit: 2016-09-04 | Discharge: 2016-09-04 | Disposition: A | Discharge status: Home / Self Care | Payer: Medicare Other | Source: Ambulatory Visit | Attending: Radiation Oncology | Admitting: Radiation Oncology

## 2016-09-04 DIAGNOSIS — Z510 Encounter for antineoplastic radiation therapy: Secondary | ICD-10-CM | POA: Diagnosis not present

## 2016-09-04 DIAGNOSIS — D649 Anemia, unspecified: Secondary | ICD-10-CM | POA: Diagnosis not present

## 2016-09-04 DIAGNOSIS — Z888 Allergy status to other drugs, medicaments and biological substances status: Secondary | ICD-10-CM | POA: Diagnosis not present

## 2016-09-04 DIAGNOSIS — Z170 Estrogen receptor positive status [ER+]: Secondary | ICD-10-CM | POA: Diagnosis not present

## 2016-09-04 DIAGNOSIS — C50411 Malignant neoplasm of upper-outer quadrant of right female breast: Secondary | ICD-10-CM | POA: Diagnosis not present

## 2016-09-04 DIAGNOSIS — K219 Gastro-esophageal reflux disease without esophagitis: Secondary | ICD-10-CM | POA: Diagnosis not present

## 2016-09-07 ENCOUNTER — Ambulatory Visit
Admission: RE | Admit: 2016-09-07 | Discharge: 2016-09-07 | Disposition: A | Discharge status: Home / Self Care | Payer: Medicare Other | Source: Ambulatory Visit | Attending: Radiation Oncology | Admitting: Radiation Oncology

## 2016-09-07 DIAGNOSIS — Z510 Encounter for antineoplastic radiation therapy: Secondary | ICD-10-CM | POA: Diagnosis not present

## 2016-09-07 DIAGNOSIS — D649 Anemia, unspecified: Secondary | ICD-10-CM | POA: Diagnosis not present

## 2016-09-07 DIAGNOSIS — Z170 Estrogen receptor positive status [ER+]: Secondary | ICD-10-CM | POA: Diagnosis not present

## 2016-09-07 DIAGNOSIS — K219 Gastro-esophageal reflux disease without esophagitis: Secondary | ICD-10-CM | POA: Diagnosis not present

## 2016-09-07 DIAGNOSIS — Z888 Allergy status to other drugs, medicaments and biological substances status: Secondary | ICD-10-CM | POA: Diagnosis not present

## 2016-09-07 DIAGNOSIS — C50411 Malignant neoplasm of upper-outer quadrant of right female breast: Secondary | ICD-10-CM | POA: Diagnosis not present

## 2016-09-08 ENCOUNTER — Ambulatory Visit
Admission: RE | Admit: 2016-09-08 | Discharge: 2016-09-08 | Disposition: A | Discharge status: Home / Self Care | Payer: Medicare Other | Source: Ambulatory Visit | Attending: Radiation Oncology | Admitting: Radiation Oncology

## 2016-09-08 DIAGNOSIS — D649 Anemia, unspecified: Secondary | ICD-10-CM | POA: Diagnosis not present

## 2016-09-08 DIAGNOSIS — C50411 Malignant neoplasm of upper-outer quadrant of right female breast: Secondary | ICD-10-CM | POA: Diagnosis not present

## 2016-09-08 DIAGNOSIS — K219 Gastro-esophageal reflux disease without esophagitis: Secondary | ICD-10-CM | POA: Diagnosis not present

## 2016-09-08 DIAGNOSIS — Z888 Allergy status to other drugs, medicaments and biological substances status: Secondary | ICD-10-CM | POA: Diagnosis not present

## 2016-09-08 DIAGNOSIS — Z170 Estrogen receptor positive status [ER+]: Secondary | ICD-10-CM | POA: Diagnosis not present

## 2016-09-08 DIAGNOSIS — Z510 Encounter for antineoplastic radiation therapy: Secondary | ICD-10-CM | POA: Diagnosis not present

## 2016-09-09 ENCOUNTER — Ambulatory Visit
Admission: RE | Admit: 2016-09-09 | Discharge: 2016-09-09 | Disposition: A | Discharge status: Home / Self Care | Payer: Medicare Other | Source: Ambulatory Visit | Attending: Radiation Oncology | Admitting: Radiation Oncology

## 2016-09-09 DIAGNOSIS — Z510 Encounter for antineoplastic radiation therapy: Secondary | ICD-10-CM | POA: Diagnosis not present

## 2016-09-09 DIAGNOSIS — Z888 Allergy status to other drugs, medicaments and biological substances status: Secondary | ICD-10-CM | POA: Diagnosis not present

## 2016-09-09 DIAGNOSIS — D649 Anemia, unspecified: Secondary | ICD-10-CM | POA: Diagnosis not present

## 2016-09-09 DIAGNOSIS — C50411 Malignant neoplasm of upper-outer quadrant of right female breast: Secondary | ICD-10-CM | POA: Diagnosis not present

## 2016-09-09 DIAGNOSIS — Z170 Estrogen receptor positive status [ER+]: Secondary | ICD-10-CM | POA: Diagnosis not present

## 2016-09-09 DIAGNOSIS — K219 Gastro-esophageal reflux disease without esophagitis: Secondary | ICD-10-CM | POA: Diagnosis not present

## 2016-09-10 ENCOUNTER — Ambulatory Visit
Admission: RE | Admit: 2016-09-10 | Discharge: 2016-09-10 | Disposition: A | Discharge status: Home / Self Care | Payer: Medicare Other | Source: Ambulatory Visit | Attending: Radiation Oncology | Admitting: Radiation Oncology

## 2016-09-10 DIAGNOSIS — Z888 Allergy status to other drugs, medicaments and biological substances status: Secondary | ICD-10-CM | POA: Diagnosis not present

## 2016-09-10 DIAGNOSIS — K219 Gastro-esophageal reflux disease without esophagitis: Secondary | ICD-10-CM | POA: Diagnosis not present

## 2016-09-10 DIAGNOSIS — D649 Anemia, unspecified: Secondary | ICD-10-CM | POA: Diagnosis not present

## 2016-09-10 DIAGNOSIS — C50411 Malignant neoplasm of upper-outer quadrant of right female breast: Secondary | ICD-10-CM | POA: Diagnosis not present

## 2016-09-10 DIAGNOSIS — Z510 Encounter for antineoplastic radiation therapy: Secondary | ICD-10-CM | POA: Diagnosis not present

## 2016-09-10 DIAGNOSIS — Z170 Estrogen receptor positive status [ER+]: Secondary | ICD-10-CM | POA: Diagnosis not present

## 2016-09-11 ENCOUNTER — Ambulatory Visit
Admission: RE | Admit: 2016-09-11 | Discharge: 2016-09-11 | Disposition: A | Discharge status: Home / Self Care | Payer: Medicare Other | Source: Ambulatory Visit | Attending: Radiation Oncology | Admitting: Radiation Oncology

## 2016-09-11 DIAGNOSIS — D649 Anemia, unspecified: Secondary | ICD-10-CM | POA: Diagnosis not present

## 2016-09-11 DIAGNOSIS — C50411 Malignant neoplasm of upper-outer quadrant of right female breast: Secondary | ICD-10-CM | POA: Diagnosis not present

## 2016-09-11 DIAGNOSIS — Z510 Encounter for antineoplastic radiation therapy: Secondary | ICD-10-CM | POA: Diagnosis not present

## 2016-09-11 DIAGNOSIS — Z170 Estrogen receptor positive status [ER+]: Secondary | ICD-10-CM | POA: Diagnosis not present

## 2016-09-11 DIAGNOSIS — K219 Gastro-esophageal reflux disease without esophagitis: Secondary | ICD-10-CM | POA: Diagnosis not present

## 2016-09-11 DIAGNOSIS — Z888 Allergy status to other drugs, medicaments and biological substances status: Secondary | ICD-10-CM | POA: Diagnosis not present

## 2016-09-12 DIAGNOSIS — C50919 Malignant neoplasm of unspecified site of unspecified female breast: Secondary | ICD-10-CM | POA: Diagnosis not present

## 2016-09-12 DIAGNOSIS — J069 Acute upper respiratory infection, unspecified: Secondary | ICD-10-CM | POA: Diagnosis not present

## 2016-09-12 DIAGNOSIS — R05 Cough: Secondary | ICD-10-CM | POA: Diagnosis not present

## 2016-09-14 ENCOUNTER — Ambulatory Visit
Admission: RE | Admit: 2016-09-14 | Discharge: 2016-09-14 | Disposition: A | Discharge status: Home / Self Care | Payer: Medicare Other | Source: Ambulatory Visit | Attending: Radiation Oncology | Admitting: Radiation Oncology

## 2016-09-14 DIAGNOSIS — Z170 Estrogen receptor positive status [ER+]: Secondary | ICD-10-CM | POA: Diagnosis not present

## 2016-09-14 DIAGNOSIS — C50411 Malignant neoplasm of upper-outer quadrant of right female breast: Secondary | ICD-10-CM | POA: Diagnosis not present

## 2016-09-14 DIAGNOSIS — Z510 Encounter for antineoplastic radiation therapy: Secondary | ICD-10-CM | POA: Diagnosis not present

## 2016-09-14 DIAGNOSIS — D649 Anemia, unspecified: Secondary | ICD-10-CM | POA: Diagnosis not present

## 2016-09-14 DIAGNOSIS — K219 Gastro-esophageal reflux disease without esophagitis: Secondary | ICD-10-CM | POA: Diagnosis not present

## 2016-09-14 DIAGNOSIS — Z888 Allergy status to other drugs, medicaments and biological substances status: Secondary | ICD-10-CM | POA: Diagnosis not present

## 2016-09-15 ENCOUNTER — Ambulatory Visit
Admission: RE | Admit: 2016-09-15 | Discharge: 2016-09-15 | Disposition: A | Discharge status: Home / Self Care | Payer: Medicare Other | Source: Ambulatory Visit | Attending: Radiation Oncology | Admitting: Radiation Oncology

## 2016-09-15 DIAGNOSIS — K219 Gastro-esophageal reflux disease without esophagitis: Secondary | ICD-10-CM | POA: Diagnosis not present

## 2016-09-15 DIAGNOSIS — C50411 Malignant neoplasm of upper-outer quadrant of right female breast: Secondary | ICD-10-CM | POA: Diagnosis not present

## 2016-09-15 DIAGNOSIS — D649 Anemia, unspecified: Secondary | ICD-10-CM | POA: Diagnosis not present

## 2016-09-15 DIAGNOSIS — Z170 Estrogen receptor positive status [ER+]: Secondary | ICD-10-CM | POA: Diagnosis not present

## 2016-09-15 DIAGNOSIS — Z510 Encounter for antineoplastic radiation therapy: Secondary | ICD-10-CM | POA: Diagnosis not present

## 2016-09-15 DIAGNOSIS — Z888 Allergy status to other drugs, medicaments and biological substances status: Secondary | ICD-10-CM | POA: Diagnosis not present

## 2016-09-17 ENCOUNTER — Ambulatory Visit
Admission: RE | Admit: 2016-09-17 | Discharge: 2016-09-17 | Disposition: A | Discharge status: Home / Self Care | Payer: Medicare Other | Source: Ambulatory Visit | Attending: Radiation Oncology | Admitting: Radiation Oncology

## 2016-09-17 DIAGNOSIS — C50411 Malignant neoplasm of upper-outer quadrant of right female breast: Secondary | ICD-10-CM | POA: Diagnosis not present

## 2016-09-17 DIAGNOSIS — Z170 Estrogen receptor positive status [ER+]: Secondary | ICD-10-CM | POA: Diagnosis not present

## 2016-09-17 DIAGNOSIS — K219 Gastro-esophageal reflux disease without esophagitis: Secondary | ICD-10-CM | POA: Diagnosis not present

## 2016-09-17 DIAGNOSIS — Z888 Allergy status to other drugs, medicaments and biological substances status: Secondary | ICD-10-CM | POA: Diagnosis not present

## 2016-09-17 DIAGNOSIS — D649 Anemia, unspecified: Secondary | ICD-10-CM | POA: Diagnosis not present

## 2016-09-17 DIAGNOSIS — Z510 Encounter for antineoplastic radiation therapy: Secondary | ICD-10-CM | POA: Diagnosis not present

## 2016-09-18 ENCOUNTER — Ambulatory Visit
Admission: RE | Admit: 2016-09-18 | Discharge: 2016-09-18 | Disposition: A | Discharge status: Home / Self Care | Payer: Medicare Other | Source: Ambulatory Visit | Attending: Radiation Oncology | Admitting: Radiation Oncology

## 2016-09-18 DIAGNOSIS — C50411 Malignant neoplasm of upper-outer quadrant of right female breast: Secondary | ICD-10-CM | POA: Diagnosis not present

## 2016-09-18 DIAGNOSIS — Z888 Allergy status to other drugs, medicaments and biological substances status: Secondary | ICD-10-CM | POA: Diagnosis not present

## 2016-09-18 DIAGNOSIS — K219 Gastro-esophageal reflux disease without esophagitis: Secondary | ICD-10-CM | POA: Diagnosis not present

## 2016-09-18 DIAGNOSIS — D649 Anemia, unspecified: Secondary | ICD-10-CM | POA: Diagnosis not present

## 2016-09-18 DIAGNOSIS — Z510 Encounter for antineoplastic radiation therapy: Secondary | ICD-10-CM | POA: Diagnosis not present

## 2016-09-18 DIAGNOSIS — Z170 Estrogen receptor positive status [ER+]: Secondary | ICD-10-CM | POA: Diagnosis not present

## 2016-09-21 ENCOUNTER — Ambulatory Visit
Admission: RE | Admit: 2016-09-21 | Discharge: 2016-09-21 | Disposition: A | Discharge status: Home / Self Care | Payer: Medicare Other | Source: Ambulatory Visit | Attending: Radiation Oncology | Admitting: Radiation Oncology

## 2016-09-21 DIAGNOSIS — C50911 Malignant neoplasm of unspecified site of right female breast: Secondary | ICD-10-CM | POA: Diagnosis not present

## 2016-09-21 DIAGNOSIS — Z888 Allergy status to other drugs, medicaments and biological substances status: Secondary | ICD-10-CM | POA: Diagnosis not present

## 2016-09-21 DIAGNOSIS — C50411 Malignant neoplasm of upper-outer quadrant of right female breast: Secondary | ICD-10-CM | POA: Diagnosis not present

## 2016-09-21 DIAGNOSIS — Z170 Estrogen receptor positive status [ER+]: Secondary | ICD-10-CM | POA: Diagnosis not present

## 2016-09-21 DIAGNOSIS — Z79899 Other long term (current) drug therapy: Secondary | ICD-10-CM | POA: Insufficient documentation

## 2016-09-21 DIAGNOSIS — Z510 Encounter for antineoplastic radiation therapy: Secondary | ICD-10-CM | POA: Diagnosis not present

## 2016-09-22 ENCOUNTER — Ambulatory Visit
Admission: RE | Admit: 2016-09-22 | Discharge: 2016-09-22 | Disposition: A | Discharge status: Home / Self Care | Payer: Medicare Other | Source: Ambulatory Visit | Attending: Radiation Oncology | Admitting: Radiation Oncology

## 2016-09-22 DIAGNOSIS — Z510 Encounter for antineoplastic radiation therapy: Secondary | ICD-10-CM | POA: Diagnosis not present

## 2016-09-22 DIAGNOSIS — Z170 Estrogen receptor positive status [ER+]: Secondary | ICD-10-CM | POA: Diagnosis not present

## 2016-09-22 DIAGNOSIS — Z79899 Other long term (current) drug therapy: Secondary | ICD-10-CM | POA: Diagnosis not present

## 2016-09-22 DIAGNOSIS — C50911 Malignant neoplasm of unspecified site of right female breast: Secondary | ICD-10-CM | POA: Diagnosis not present

## 2016-09-22 DIAGNOSIS — C50411 Malignant neoplasm of upper-outer quadrant of right female breast: Secondary | ICD-10-CM | POA: Diagnosis not present

## 2016-09-22 DIAGNOSIS — Z888 Allergy status to other drugs, medicaments and biological substances status: Secondary | ICD-10-CM | POA: Diagnosis not present

## 2016-09-23 ENCOUNTER — Ambulatory Visit
Admission: RE | Admit: 2016-09-23 | Discharge: 2016-09-23 | Disposition: A | Discharge status: Home / Self Care | Payer: Medicare Other | Source: Ambulatory Visit | Attending: Radiation Oncology | Admitting: Radiation Oncology

## 2016-09-23 DIAGNOSIS — Z170 Estrogen receptor positive status [ER+]: Secondary | ICD-10-CM | POA: Diagnosis not present

## 2016-09-23 DIAGNOSIS — Z510 Encounter for antineoplastic radiation therapy: Secondary | ICD-10-CM | POA: Diagnosis not present

## 2016-09-23 DIAGNOSIS — Z79899 Other long term (current) drug therapy: Secondary | ICD-10-CM | POA: Diagnosis not present

## 2016-09-23 DIAGNOSIS — C50411 Malignant neoplasm of upper-outer quadrant of right female breast: Secondary | ICD-10-CM | POA: Diagnosis not present

## 2016-09-23 DIAGNOSIS — C50911 Malignant neoplasm of unspecified site of right female breast: Secondary | ICD-10-CM | POA: Diagnosis not present

## 2016-09-23 DIAGNOSIS — Z888 Allergy status to other drugs, medicaments and biological substances status: Secondary | ICD-10-CM | POA: Diagnosis not present

## 2016-09-24 ENCOUNTER — Ambulatory Visit
Admission: RE | Admit: 2016-09-24 | Discharge: 2016-09-24 | Disposition: A | Discharge status: Home / Self Care | Payer: Medicare Other | Source: Ambulatory Visit | Attending: Radiation Oncology | Admitting: Radiation Oncology

## 2016-09-24 DIAGNOSIS — Z170 Estrogen receptor positive status [ER+]: Secondary | ICD-10-CM | POA: Diagnosis not present

## 2016-09-24 DIAGNOSIS — C50911 Malignant neoplasm of unspecified site of right female breast: Secondary | ICD-10-CM | POA: Diagnosis not present

## 2016-09-24 DIAGNOSIS — C50411 Malignant neoplasm of upper-outer quadrant of right female breast: Secondary | ICD-10-CM | POA: Diagnosis not present

## 2016-09-24 DIAGNOSIS — Z888 Allergy status to other drugs, medicaments and biological substances status: Secondary | ICD-10-CM | POA: Diagnosis not present

## 2016-09-24 DIAGNOSIS — Z79899 Other long term (current) drug therapy: Secondary | ICD-10-CM | POA: Diagnosis not present

## 2016-09-24 DIAGNOSIS — Z510 Encounter for antineoplastic radiation therapy: Secondary | ICD-10-CM | POA: Diagnosis not present

## 2016-09-25 ENCOUNTER — Ambulatory Visit
Admission: RE | Admit: 2016-09-25 | Discharge: 2016-09-25 | Disposition: A | Discharge status: Home / Self Care | Payer: Medicare Other | Source: Ambulatory Visit | Attending: Radiation Oncology | Admitting: Radiation Oncology

## 2016-09-25 DIAGNOSIS — Z170 Estrogen receptor positive status [ER+]: Secondary | ICD-10-CM | POA: Diagnosis not present

## 2016-09-25 DIAGNOSIS — Z79899 Other long term (current) drug therapy: Secondary | ICD-10-CM | POA: Diagnosis not present

## 2016-09-25 DIAGNOSIS — C50411 Malignant neoplasm of upper-outer quadrant of right female breast: Secondary | ICD-10-CM | POA: Diagnosis not present

## 2016-09-25 DIAGNOSIS — Z888 Allergy status to other drugs, medicaments and biological substances status: Secondary | ICD-10-CM | POA: Diagnosis not present

## 2016-09-25 DIAGNOSIS — Z510 Encounter for antineoplastic radiation therapy: Secondary | ICD-10-CM | POA: Diagnosis not present

## 2016-09-25 DIAGNOSIS — C50911 Malignant neoplasm of unspecified site of right female breast: Secondary | ICD-10-CM | POA: Diagnosis not present

## 2016-09-28 ENCOUNTER — Ambulatory Visit
Admission: RE | Admit: 2016-09-28 | Discharge: 2016-09-28 | Disposition: A | Discharge status: Home / Self Care | Payer: Medicare Other | Source: Ambulatory Visit | Attending: Radiation Oncology | Admitting: Radiation Oncology

## 2016-09-28 DIAGNOSIS — Z170 Estrogen receptor positive status [ER+]: Secondary | ICD-10-CM | POA: Diagnosis not present

## 2016-09-28 DIAGNOSIS — C50411 Malignant neoplasm of upper-outer quadrant of right female breast: Secondary | ICD-10-CM | POA: Diagnosis not present

## 2016-09-28 DIAGNOSIS — Z79899 Other long term (current) drug therapy: Secondary | ICD-10-CM | POA: Diagnosis not present

## 2016-09-28 DIAGNOSIS — Z510 Encounter for antineoplastic radiation therapy: Secondary | ICD-10-CM | POA: Diagnosis not present

## 2016-09-28 DIAGNOSIS — C50911 Malignant neoplasm of unspecified site of right female breast: Secondary | ICD-10-CM | POA: Diagnosis not present

## 2016-09-28 DIAGNOSIS — Z888 Allergy status to other drugs, medicaments and biological substances status: Secondary | ICD-10-CM | POA: Diagnosis not present

## 2016-09-29 ENCOUNTER — Encounter: Payer: Self-pay | Admitting: *Deleted

## 2016-09-29 ENCOUNTER — Encounter: Payer: Self-pay | Admitting: Radiation Oncology

## 2016-09-29 ENCOUNTER — Ambulatory Visit
Admission: RE | Admit: 2016-09-29 | Discharge: 2016-09-29 | Disposition: A | Discharge status: Home / Self Care | Payer: Medicare Other | Source: Ambulatory Visit | Attending: Radiation Oncology | Admitting: Radiation Oncology

## 2016-09-29 DIAGNOSIS — Z79899 Other long term (current) drug therapy: Secondary | ICD-10-CM | POA: Diagnosis not present

## 2016-09-29 DIAGNOSIS — Z510 Encounter for antineoplastic radiation therapy: Secondary | ICD-10-CM | POA: Diagnosis not present

## 2016-09-29 DIAGNOSIS — C50911 Malignant neoplasm of unspecified site of right female breast: Secondary | ICD-10-CM | POA: Diagnosis not present

## 2016-09-29 DIAGNOSIS — Z170 Estrogen receptor positive status [ER+]: Secondary | ICD-10-CM | POA: Diagnosis not present

## 2016-09-29 DIAGNOSIS — Z888 Allergy status to other drugs, medicaments and biological substances status: Secondary | ICD-10-CM | POA: Diagnosis not present

## 2016-09-29 DIAGNOSIS — C50411 Malignant neoplasm of upper-outer quadrant of right female breast: Secondary | ICD-10-CM | POA: Diagnosis not present

## 2016-10-02 ENCOUNTER — Telehealth: Payer: Self-pay

## 2016-10-02 NOTE — Telephone Encounter (Signed)
Pt's son, Jenean Lindau) called wanting clarification over when his mother needs to start taking her prescription for Tamoxifen.   Per GMs last office note pt needs to start Tamoxifen mid August.  Explained to [place] the reason for this is to give the body time to recuperate from radiation.  Also, explained to Troy the importance of someone helping the patient to remember to start taking the medication and to please call us with any questions or concerns that may arise.

## 2016-10-07 NOTE — Progress Notes (Signed)
  Radiation Oncology         [phone] ________________________________  Name: Marilyn Haas MRN: [id number]  Date: 09/29/2016  DOB: [birth year]  End of Treatment Note  Diagnosis:   Stage IA, pT2N0Mx ER/PR positive, invasive lobular carcinoma of the right breast.     Indication for treatment:  Curative       Radiation treatment dates:   09/01/2016 to 09/29/2016  Site/dose:    1. The Right breast was treated to 42.5 Gy in 17 fractions at 2.5 Gy per fraction. 2. The Right breast was boosted to 7.5 Gy in 3 fractions at 2.5 Gy per fraction.   Beams/energy:    1. 3D // 10X, 6X 2. En face electron // 18E  Narrative: The patient tolerated radiation treatment relatively well.  She developed mild to moderate radiation effect in the treatment area. No desquamation. She did complain of nausea and poor appetite. Using radiaplex bid.  Plan: The patient has completed radiation treatment. The patient will return to radiation oncology clinic for routine followup in one month. I advised them to call or return sooner if they have any questions or concerns related to their recovery or treatment.  ------------------------------------------------  Jodelle Gross, MD, PhD  This document serves as a record of services personally performed by Kyung Rudd, MD. It was created on his behalf by Arlyce Harman, a trained medical scribe. The creation of this record is based on the scribe's personal observations and the provider's statements to them. This document has been checked and approved by the attending provider.

## 2016-11-24 ENCOUNTER — Ambulatory Visit: Payer: Medicare Other | Admitting: Radiation Oncology

## 2016-12-02 DIAGNOSIS — F419 Anxiety disorder, unspecified: Secondary | ICD-10-CM | POA: Diagnosis not present

## 2016-12-02 DIAGNOSIS — F329 Major depressive disorder, single episode, unspecified: Secondary | ICD-10-CM | POA: Diagnosis not present

## 2016-12-02 DIAGNOSIS — G894 Chronic pain syndrome: Secondary | ICD-10-CM | POA: Diagnosis not present

## 2016-12-02 DIAGNOSIS — Z6822 Body mass index (BMI) 22.0-22.9, adult: Secondary | ICD-10-CM | POA: Diagnosis not present

## 2016-12-02 DIAGNOSIS — Z23 Encounter for immunization: Secondary | ICD-10-CM | POA: Diagnosis not present

## 2016-12-02 DIAGNOSIS — J329 Chronic sinusitis, unspecified: Secondary | ICD-10-CM | POA: Diagnosis not present

## 2016-12-02 DIAGNOSIS — Z1389 Encounter for screening for other disorder: Secondary | ICD-10-CM | POA: Diagnosis not present

## 2016-12-08 ENCOUNTER — Ambulatory Visit
Admission: RE | Admit: 2016-12-08 | Discharge: 2016-12-08 | Disposition: A | Discharge status: Home / Self Care | Payer: Medicare Other | Source: Ambulatory Visit | Attending: Radiation Oncology | Admitting: Radiation Oncology

## 2016-12-08 ENCOUNTER — Encounter: Payer: Self-pay | Admitting: Radiation Oncology

## 2016-12-08 VITALS — BP 140/80 | HR 88 | Temp 98.0°F | Resp 18 | Ht 65.0 in | Wt 126.4 lb

## 2016-12-08 DIAGNOSIS — C50911 Malignant neoplasm of unspecified site of right female breast: Secondary | ICD-10-CM | POA: Diagnosis not present

## 2016-12-08 DIAGNOSIS — Z888 Allergy status to other drugs, medicaments and biological substances status: Secondary | ICD-10-CM | POA: Diagnosis not present

## 2016-12-08 DIAGNOSIS — C50411 Malignant neoplasm of upper-outer quadrant of right female breast: Secondary | ICD-10-CM

## 2016-12-08 DIAGNOSIS — Z170 Estrogen receptor positive status [ER+]: Secondary | ICD-10-CM | POA: Diagnosis not present

## 2016-12-08 DIAGNOSIS — Z510 Encounter for antineoplastic radiation therapy: Secondary | ICD-10-CM | POA: Diagnosis not present

## 2016-12-08 DIAGNOSIS — Z79899 Other long term (current) drug therapy: Secondary | ICD-10-CM | POA: Diagnosis not present

## 2016-12-08 MED ORDER — NYSTATIN 100000 UNIT/GM EX CREA: 1.0000 "application " | TOPICAL_CREAM | Freq: Three times a day (TID) | CUTANEOUS | 0 refills | Status: DC

## 2016-12-08 NOTE — Addendum Note (Signed)
Encounter addended by: Malena Edman, RN on: 12/08/2016  3:51 PM<BR>    Actions taken: Charge Capture section accepted

## 2016-12-08 NOTE — Progress Notes (Signed)
Radiation Oncology         [phone] ________________________________  Name: NALINA YEATMAN MRN: [id number]  Date of Service: 12/08/2016  DOB: [birth year]  Post Treatment Note  CC: Redmond School, MD  Fanny Skates, MD  Diagnosis:   Stage IA, pT2N0Mx ER/PR positive, invasive lobular carcinoma of the right breast.     Interval Since Last Radiation: 10 weeks   09/01/2016 to 09/29/2016 1. The Right breast was treated to 42.5 Gy in 17 fractions at 2.5 Gy per fraction. 2. The Right breast was boosted to 7.5 Gy in 3 fractions at 2.5 Gy per fraction.                        On review of systems, the patient states she's doing ok. She is grieving the recent loss of her husband, and reports that since radiation, she's been doing well within the last couple of weeks has had some discomfort in the right nipple area. She describes that it has been read and sore to the touch, however the soreness has improved, and the redness is also improved. She denies any fevers or chills. She has been doing well and tolerating tamoxifen. No other complaints or verbalized.  ALLERGIES:  is allergic to avelox [moxifloxacin hcl in nacl].  Meds: Current Outpatient Prescriptions  Medication Sig Dispense Refill  . ALPRAZolam (XANAX) 0.5 MG tablet Take 0.5 mg by mouth at bedtime as needed for anxiety.    . DULoxetine (CYMBALTA) 60 MG capsule Take 60 mg by mouth every morning.     Marland Kitchen HYDROcodone-acetaminophen (NORCO) 10-325 MG tablet Take 1 tablet by mouth.    Marland Kitchen ketoconazole (NIZORAL) 2 % cream Apply 1 application topically daily. 15 g 0  . tamoxifen (NOLVADEX) 10 MG tablet Take 10 mg by mouth daily.     No current facility-administered medications for this encounter.     Physical Findings:  height is 5\' 5"  (1.651 m) and weight is 126 lb 6.4 oz (57.3 kg). Her oral temperature is 98 F (36.7 C). Her blood pressure is 140/80 and her pulse is 88. Her respiration is 18 and oxygen saturation is 99%.  Pain  Assessment Pain Score: 0-No pain/10 In general this is a well appearing Caucasian female in no acute distress. She's alert and oriented x4 and appropriate throughout the examination. Cardiopulmonary assessment is negative for acute distress and she exhibits normal effort. The right breast was examined and reveals mild cutaneous plaquing and debris along the areola at the base of the nipple superiorly. This appears to be consistent with Candida. No punctate lesions are identified, and no heat is noted, though the nipple itself is somewhat erythematous compared to the left. She still has her skin markings in place from her electron boost set up.   Lab Findings: Lab Results  Component Value Date   WBC 5.8 07/23/2016   HGB 10.8 (L) 07/23/2016   HCT 33.2 (L) 07/23/2016   MCV 92.7 07/23/2016   PLT 254 07/23/2016     Radiographic Findings: No results found.  Impression/Plan: 1. Stage IA, pT2N0Mx ER/PR positive, invasive lobular carcinoma of the right breast. The patient has been doing well since completion of radiotherapy. We discussed that we would be happy to continue to follow her as needed, but she will also continue to follow up with Dr. Jana Hakim in medical oncology. She was counseled on skin care as well as measures to avoid sun exposure to this area.  2. Survivorship.  The patient will follow-up normally insertion clinic in January when she is scheduled for. 3. Cutaneous candida. A prescription for nystatin cream is called into in the pharmacy for her given the findings clinically, she is encouraged to call this does not improve, or or symptoms worsen.     Carola Rhine, PAC

## 2016-12-14 DIAGNOSIS — C50411 Malignant neoplasm of upper-outer quadrant of right female breast: Secondary | ICD-10-CM | POA: Diagnosis not present

## 2016-12-14 DIAGNOSIS — C50911 Malignant neoplasm of unspecified site of right female breast: Secondary | ICD-10-CM | POA: Diagnosis not present

## 2016-12-14 DIAGNOSIS — Z96642 Presence of left artificial hip joint: Secondary | ICD-10-CM | POA: Diagnosis not present

## 2016-12-14 DIAGNOSIS — I10 Essential (primary) hypertension: Secondary | ICD-10-CM | POA: Diagnosis not present

## 2016-12-21 ENCOUNTER — Ambulatory Visit (INDEPENDENT_AMBULATORY_CARE_PROVIDER_SITE_OTHER): Payer: Medicare Other | Admitting: Otolaryngology

## 2016-12-21 ENCOUNTER — Ambulatory Visit (HOSPITAL_BASED_OUTPATIENT_CLINIC_OR_DEPARTMENT_OTHER): Payer: Medicare Other | Admitting: Oncology

## 2016-12-21 ENCOUNTER — Telehealth: Payer: Self-pay | Admitting: Oncology

## 2016-12-21 ENCOUNTER — Other Ambulatory Visit (HOSPITAL_BASED_OUTPATIENT_CLINIC_OR_DEPARTMENT_OTHER): Payer: Medicare Other

## 2016-12-21 VITALS — BP 152/82 | HR 79 | Temp 98.4°F | Resp 17 | Ht 65.0 in | Wt 126.8 lb

## 2016-12-21 DIAGNOSIS — C50411 Malignant neoplasm of upper-outer quadrant of right female breast: Secondary | ICD-10-CM

## 2016-12-21 DIAGNOSIS — Z170 Estrogen receptor positive status [ER+]: Secondary | ICD-10-CM | POA: Diagnosis not present

## 2016-12-21 DIAGNOSIS — G4700 Insomnia, unspecified: Secondary | ICD-10-CM

## 2016-12-21 DIAGNOSIS — Z79810 Long term (current) use of selective estrogen receptor modulators (SERMs): Secondary | ICD-10-CM

## 2016-12-21 DIAGNOSIS — N951 Menopausal and female climacteric states: Secondary | ICD-10-CM

## 2016-12-21 LAB — COMPREHENSIVE METABOLIC PANEL
ALBUMIN: 3.7 g/dL (ref 3.5–5.0)
ALK PHOS: 62 U/L (ref 40–150)
ALT: 8 U/L (ref 0–55)
ANION GAP: 6 meq/L (ref 3–11)
AST: 15 U/L (ref 5–34)
BILIRUBIN TOTAL: 0.4 mg/dL (ref 0.20–1.20)
BUN: 14.1 mg/dL (ref 7.0–26.0)
CO2: 28 mEq/L (ref 22–29)
Calcium: 8.8 mg/dL (ref 8.4–10.4)
Chloride: 105 mEq/L (ref 98–109)
Creatinine: 0.9 mg/dL (ref 0.6–1.1)
EGFR: 60 mL/min/{1.73_m2} — AB (ref >90)
Glucose: 86 mg/dl (ref 70–140)
Potassium: 4.2 mEq/L (ref 3.5–5.1)
Sodium: 139 mEq/L (ref 136–145)
TOTAL PROTEIN: 7 g/dL (ref 6.4–8.3)

## 2016-12-21 LAB — CBC WITH DIFFERENTIAL/PLATELET
BASO%: 0.8 % (ref 0.0–2.0)
Basophils Absolute: 0 10*3/uL (ref 0.0–0.1)
EOS ABS: 0.1 10*3/uL (ref 0.0–0.5)
EOS%: 2.6 % (ref 0.0–7.0)
HCT: 35 % (ref 34.8–46.6)
HEMOGLOBIN: 11.2 g/dL — AB (ref 11.6–15.9)
LYMPH%: 32.7 % (ref 14.0–49.7)
MCH: 30.1 pg (ref 25.1–34.0)
MCHC: 32 g/dL (ref 31.5–36.0)
MCV: 94.1 fL (ref 79.5–101.0)
MONO#: 0.5 10*3/uL (ref 0.1–0.9)
MONO%: 9.9 % (ref 0.0–14.0)
NEUT%: 54 % (ref 38.4–76.8)
NEUTROS ABS: 2.7 10*3/uL (ref 1.5–6.5)
PLATELETS: 179 10*3/uL (ref 145–400)
RBC: 3.72 10*6/uL (ref 3.70–5.45)
RDW: 13 % (ref 11.2–14.5)
WBC: 4.9 10*3/uL (ref 3.9–10.3)
lymph#: 1.6 10*3/uL (ref 0.9–3.3)

## 2016-12-21 MED ORDER — GABAPENTIN 100 MG PO CAPS: 100.0000 mg | ORAL_CAPSULE | Freq: Every day | ORAL | 6 refills | Status: DC

## 2016-12-21 NOTE — Progress Notes (Signed)
[facility]  [phone] [phone]     ID: Marilyn Haas DOB: 09-Jul-1940  MR#: [id number]  ZHY#:[id number]  Patient Care Team: Elfredia Nevins, MD as PCP - General (Internal Medicine) Arnetha Silverthorne, Valentino Hue, MD as Consulting Physician (Oncology) Claud Kelp, MD as Consulting Physician (General Surgery) Dorothy Puffer, MD as Consulting Physician (Radiation Oncology) Malissa Hippo, MD as Consulting Physician (Gastroenterology) Ollen Gross, MD as Consulting Physician (Orthopedic Surgery) Lowella Dell, MD OTHER MD:  CHIEF COMPLAINT: Estrogen receptor positive lobular breast cancer  CURRENT TREATMENT: Tamoxifen  BREAST CANCER HISTORY: From the original intake note:  The patient had bilateral screening mammography at the [facility] 04/22/2016 showing an area of possible asymmetry and calcifications in the right breast. Right diagnostic mammography with tomography and right breast ultrasonography 05/12/2016 showed the breast density to be category C. In the right breast upper outer quadrant there was a. A real lower area of architectural distortion which was palpable in the 11:00 radians 1 cm from the nipple. Ultrasonography confirmed an irregular hypoechoic mass at this location measuring 1.4 cm. Ultrasound of the right axilla was unremarkable.  Biopsy of the right breast mass in question 05/19/2016 showed ([id number]) and invasive lobular carcinoma, E-cadherin negative, estrogen receptor 90% positive, progesterone receptor 15% positive, both with strong staining intensity, with an MIB-1 of 5%, and no HER-2 amplification, the signals ratio being 1.51 and the number per cell 3.10.  Breast MRI 06/08/2016 confirmed an irregular enhancing mass in the upper-outer quadrant of the right breast measuring 1.5 cm, with adjacent biopsy clip artifact. There was an area of contiguous non-mass like enhancement bringing the total dimension to 1.9 cm. However  there was no evidence of multicentric disease and no evidence of metastatic lymphadenopathy.  The patient's subsequent history is as detailed below   INTERVAL HISTORY: Marilyn Haas returns today for follow-up and treatment of her estrogen receptor positive breast cancer. Since her last visit here she completed her radiation treatments. Generally she did well with them, although currently she is feeling a little bit tired, perhaps as an late effect of those treatments.   Pt started Tamoxifen in August 2018 and reports that she is having hot flashes. She states that she was informed by Dr. Derrell Lolling that Tamoxifen causes her hot flashes. Pt reports that she takes Tamoxifen at night and her hot flashes are worsened at night. Pt notes that she did well overall with radiation therapy.   She notes that over the summer her husband was sick and in rehab for 30 days and did well. Pt states that her sons were with her aiding in taking care of the family. Pt notes that her husband was dx with pancreatic cancer in the ED and following his diagnosis he had at home hospice and the patient reports that her husband passed away in Oct 06, 2016. Pt reports that she is doing "okay" since the passing of her husband and that she lives at home alone. Pt states that her sons live in [place], [place], and [place]. She notes that one of her sons that lives in [place] evacuated to [place] during [place].   REVIEW OF SYSTEMS: Marilyn Haas reports denies unusual headaches, visual changes, nausea, vomiting, or dizziness. There has been no unusual cough, phlegm production, or pleurisy. This been no change in bowel or bladder habits. She denies unexplained fatigue or unexplained weight loss, bleeding, rash, or fever. A detailed review of systems was otherwise negative.     PAST MEDICAL HISTORY:  Past Medical History:  Diagnosis Date  . Anemia   . Anxiety    takes Xanax as needed  . Arthritis   . Breast cancer  (HCC) 05/19/2016   Right breast  . Childhood asthma   . Chronic back pain    scoliosis,stenosis  . Depression    takes Cymbalta daily  . Diverticulosis   . Family history of adverse reaction to anesthesia    son gets very sick  . GERD (gastroesophageal reflux disease)    not on any meds  . Hard of hearing   . Headache   . History of blood transfusion   . History of colon polyps    benign  . History of hiatal hernia   . Insomnia   . Joint pain   . Osteoporosis   . Pneumonia    20+ yrs ago    PAST SURGICAL HISTORY: Past Surgical History:  Procedure Laterality Date  . BREAST LUMPECTOMY WITH RADIOACTIVE SEED AND SENTINEL LYMPH NODE BIOPSY Right 07/31/2016   Procedure: BREAST LUMPECTOMY WITH RADIOACTIVE SEED AND SENTINEL LYMPH NODE BIOPSY, INJECT BLUE DYE RIGHT BREAST;  Surgeon: Claud Kelp, MD;  [facility];  Service: General;  Laterality: Right;  . COLONOSCOPY    . 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 07/04/2014   Procedure: LEFT CONVERSION TO TOTAL HIP ARTHROPLASTY;  Surgeon: Ollen Gross, MD;  Location: [facility];  Service: Orthopedics;  Laterality: Left;  . ESOPHAGOGASTRODUODENOSCOPY N/A 01/26/2014   Procedure: ESOPHAGOGASTRODUODENOSCOPY (EGD);  Surgeon: Malissa Hippo, MD;  [facility];  Service: Endoscopy;  Laterality: N/A;  1030  . HIP FRACTURE SURGERY     05/2009 left -Turner Daniels  . MALONEY DILATION N/A 01/26/2014   Procedure: Marilyn Haas DILATION;  Surgeon: Malissa Hippo, MD;  [facility];  Service: Endoscopy;  Laterality: N/A;    FAMILY HISTORY Family History  Problem Relation Age of Onset  . Heart failure Mother   . Diabetes Mother   . Hypertension Mother   . Hypertension Sister   . Heart disease Sister        Premature CAD  . Cancer Maternal Aunt        breast  The patient's father died at age 13 from rupture of an abdominal aortic aneurysm. The patient's mother died with heart failure in the setting of diabetes at age 36. The patient had no  brothers, 2 sisters. One sister was diagnosed with breast cancer at age 29. One maternal aunt was diagnosed with breast cancer in her 40s  GYNECOLOGIC HISTORY:  No LMP recorded. Patient is postmenopausal. Menarche age 37, first live birth age 32, which the patient is aware increases the risk of breast cancer. She is GX P3. She does not quite recall when she went through the change of life but thinks he was in her 28s. She did not use hormone replacement.  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 Her husband"Boyd" Orser, 79, was in sales. She is his primary caregiver. There has been some mental decline recently. Son Trinna Post lives in [place] where he works in Armed forces training and education officer. Son Willaim Sheng lives in [place] and works in Magazine features editor. Son Cletis Athens lives in [place] and works as a Curator. The patient has 12 grandchildren and 2 great-grandchildren. She is a Control and instrumentation engineer.     ADVANCED DIRECTIVES: Not addressed at 06/22/2016 meeting   HEALTH MAINTENANCE: Social History  Substance Use Topics  . Smoking status: Former Smoker    Packs/day: 0.50  Years: 2.00    Types: Cigarettes    Quit date: 01/15/1961  . Smokeless tobacco: Never Used  . Alcohol use No     Colonoscopy: UTD/Rehman  PAP: remote  Bone density: 04/22/2016   Allergies  Allergen Reactions  . Avelox [Moxifloxacin Hcl In Nacl] Other (See Comments)    Altered mental status    Current Outpatient Prescriptions  Medication Sig Dispense Refill  . ALPRAZolam (XANAX) 0.5 MG tablet Take 0.5 mg by mouth at bedtime as needed for anxiety.    . DULoxetine (CYMBALTA) 60 MG capsule Take 60 mg by mouth every morning.     Marland Kitchen HYDROcodone-acetaminophen (NORCO) 10-325 MG tablet Take 1 tablet by mouth.    . nystatin cream (MYCOSTATIN) Apply 1 application topically 3 (three) times daily. 30 g 0  . tamoxifen (NOLVADEX) 10 MG tablet Take 10 mg by mouth daily.     No current facility-administered medications for this visit.      OBJECTIVE: Older white womanW in no acute distress  Vitals:   12/21/16 1004  BP: (!) 152/82  Pulse: 79  Resp: 17  Temp: 98.4 F (36.9 C)  SpO2: 98%     Body mass index is 21.1 kg/m.    ECOG FS:1 - Symptomatic but completely ambulatory  Sclerae unicteric, pupils round and equal Oropharynx clear and moist No cervical or supraclavicular adenopathy Lungs no rales or rhonchi Heart regular rate and rhythm Abd soft, nontender, positive bowel sounds MSK no focal spinal tenderness, no upper extremity lymphedema Neuro: nonfocal, well oriented, appropriate affect Breasts: The right breast is status post lumpectomy and radiation. The cosmetic result is good. There is a slight dimple over the nipple, which is expected. The earlier nipple irritation has resolved. The left breast is benign. Both axillae are benign.  LAB RESULTS:  CMP     Component Value Date/Time   NA 139 12/21/2016 0931   K 4.2 12/21/2016 0931   CL 100 (L) 07/23/2016 1352   CO2 28 12/21/2016 0931   GLUCOSE 86 12/21/2016 0931   BUN 14.1 12/21/2016 0931   CREATININE 0.9 12/21/2016 0931   CALCIUM 8.8 12/21/2016 0931   PROT 7.0 12/21/2016 0931   ALBUMIN 3.7 12/21/2016 0931   AST 15 12/21/2016 0931   ALT 8 12/21/2016 0931   ALKPHOS 62 12/21/2016 0931   BILITOT 0.40 12/21/2016 0931   GFRNONAA 59 (L) 07/23/2016 1352   GFRAA >60 07/23/2016 1352    No results found for: TOTALPROTELP, ALBUMINELP, A1GS, A2GS, BETS, BETA2SER, GAMS, MSPIKE, SPEI  No results found for: Ron Parker, [facility]  Lab Results  Component Value Date   WBC 4.9 12/21/2016   NEUTROABS 2.7 12/21/2016   HGB 11.2 (L) 12/21/2016   HCT 35.0 12/21/2016   MCV 94.1 12/21/2016   PLT 179 12/21/2016      Chemistry      Component Value Date/Time   NA 139 12/21/2016 0931   K 4.2 12/21/2016 0931   CL 100 (L) 07/23/2016 1352   CO2 28 12/21/2016 0931   BUN 14.1 12/21/2016 0931   CREATININE 0.9 12/21/2016 0931      Component Value  Date/Time   CALCIUM 8.8 12/21/2016 0931   ALKPHOS 62 12/21/2016 0931   AST 15 12/21/2016 0931   ALT 8 12/21/2016 0931   BILITOT 0.40 12/21/2016 0931       No results found for: LABCA2  No components found for: [id number]  No results for input(s): INR in the last 168 hours.  Urinalysis  Component Value Date/Time   COLORURINE YELLOW 10/11/2015 1211   APPEARANCEUR CLEAR 10/11/2015 1211   LABSPEC <1.005 (L) 10/11/2015 1211   PHURINE 6.5 10/11/2015 1211   GLUCOSEU 100 (A) 10/11/2015 1211   HGBUR TRACE (A) 10/11/2015 1211   BILIRUBINUR NEGATIVE 10/11/2015 1211   KETONESUR NEGATIVE 10/11/2015 1211   PROTEINUR NEGATIVE 10/11/2015 1211   UROBILINOGEN 1.0 06/26/2014 1400   NITRITE NEGATIVE 10/11/2015 1211   LEUKOCYTESUR NEGATIVE 10/11/2015 1211     STUDIES: No results found.  ELIGIBLE FOR AVAILABLE RESEARCH PROTOCOL: no  ASSESSMENT: 76 y.o. [place] woman status post right breast upper outer quadrant biopsy 05/19/2016 for a clinical T1c N0, stage IA invasive lobular breast cancer, estrogen and progesterone receptor positive, HER-2 nonamplified, with an MIB-15%.   (1) Status post right lumpectomy and sentinel lymph node sampling 07/31/2016 for a pT2 pN0, stage IB invasive lobular carcinoma, grade 2, with negative margins  (2) adjuvant radiation  09/01/2016 to 09/29/2016 Site/dose:    1. The Right breast was treated to 42.5 Gy in 17 fractions at 2.5 Gy per fraction. 2. The Right breast was boosted to 7.5 Gy in 3 fractions at 2.5 Gy per fraction.    (3)  started tamoxifen 10/26/2016  (a) bone density 04/22/2016 shows a T score in the right femur of -2.0, right radius -2.4   PLAN:  Marilyn Haas has completed her local treatment and is now on tamoxifen. She tolerates it generally well except for the hot flashes. Hopefully these will improve with time.  I am starting her on gabapentin, 100 mg at bedtime, which I think will be helpful both to control the nighttime hot flashes and  to help her sleep a little.  The insomnia of course is partly going to be due to the recent loss of her husband. It is very difficult for her to be in the house all by herself at night. She is doing a great job of continuing as normal and life as possible however.  She is going to return to see me in February. She will see her surgeon again in May or June and we will continue to "TAg team her" in that fashion for the next year or so after which we will broadening the follow-up interval  She knows to call for any problems that may develop before the next visit.  Lowella Dell, MD 12/21/16  10:29 AM  Medical Oncology and Hematology [facility] [address] [place], [place] [address] Tel. [phone] Fax. [phone]   This document serves as a record of services personally performed by Ruthann Cancer, MD. It was created on her behalf by Chestine Spore, a trained medical scribe. The creation of this record is based on the scribe's personal observations and the provider's statements to them. This document has been checked and approved by the attending provider.

## 2016-12-21 NOTE — Telephone Encounter (Signed)
Left message for patient regarding appts added per 10/8 los. Sending confirmation letter as well.

## 2017-02-16 ENCOUNTER — Encounter: Payer: Self-pay | Admitting: Cardiology

## 2017-02-16 ENCOUNTER — Ambulatory Visit (INDEPENDENT_AMBULATORY_CARE_PROVIDER_SITE_OTHER): Payer: Medicare Other | Admitting: Cardiology

## 2017-02-16 VITALS — BP 126/76 | HR 55 | Ht 65.0 in | Wt 129.0 lb

## 2017-02-16 DIAGNOSIS — I493 Ventricular premature depolarization: Secondary | ICD-10-CM

## 2017-02-16 DIAGNOSIS — Z853 Personal history of malignant neoplasm of breast: Secondary | ICD-10-CM

## 2017-02-16 NOTE — Progress Notes (Signed)
Cardiology Office Note  Date: 02/16/2017   ID: Caytlin, Better 06-14-1940, MRN [id number]  PCP: Redmond School, MD  Primary Cardiologist: Rozann Lesches, MD   Chief Complaint  Patient presents with  . History of PVCs    History of Present Illness: Marilyn Haas is a 76 y.o. female last seen in November 2017.  She presents for a routine follow-up visit.  She reports occasional palpitations, but no unusual dizziness and no syncope or chest pain.  We talked about her late husband (married 78 years) who died back in 11/02/2022.  She is still grieving but seems to be doing reasonably well with good support from family and friends.  She is following with oncology, history of invasive lobar carcinoma of the right breast.  She is status post lumpectomy, also treatment with XRT and tamoxifen.  I personally reviewed her ECG today which shows sinus rhythm with decreased R wave progression.  Past Medical History:  Diagnosis Date  . Anemia   . Anxiety   . Arthritis   . Breast cancer ([place]) 05/19/2016   Right breast  . Childhood asthma   . Chronic back pain    Scoliosis, stenosis  . Depression   . Diverticulosis   . GERD (gastroesophageal reflux disease)   . Hard of hearing   . Headache   . History of blood transfusion   . History of colon polyps   . History of hiatal hernia   . Insomnia   . Osteoporosis   . Pneumonia    20+ yrs ago    Past Surgical History:  Procedure Laterality Date  . BREAST LUMPECTOMY WITH RADIOACTIVE SEED AND SENTINEL LYMPH NODE BIOPSY Right 07/31/2016   Procedure: BREAST LUMPECTOMY WITH RADIOACTIVE SEED AND SENTINEL LYMPH NODE BIOPSY, INJECT BLUE DYE RIGHT BREAST;  Surgeon: Fanny Skates, MD;  Location: [place];  Service: General;  Laterality: Right;  . COLONOSCOPY    . 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 07/04/2014   Procedure: LEFT CONVERSION TO TOTAL HIP ARTHROPLASTY;  Surgeon: Gaynelle Arabian, MD;  [facility];  Service: Orthopedics;  Laterality:  Left;  . ESOPHAGOGASTRODUODENOSCOPY N/A 01/26/2014   Procedure: ESOPHAGOGASTRODUODENOSCOPY (EGD);  Surgeon: Rogene Houston, MD;  [facility];  Service: Endoscopy;  Laterality: N/A;  1030  . HIP FRACTURE SURGERY     05/2009 left -Mayer Camel  . MALONEY DILATION N/A 01/26/2014   Procedure: Venia Minks DILATION;  Surgeon: Rogene Houston, MD;  [facility];  Service: Endoscopy;  Laterality: N/A;    Current Outpatient Medications  Medication Sig Dispense Refill  . ALPRAZolam (XANAX) 0.5 MG tablet Take 0.5 mg by mouth at bedtime as needed for anxiety.    . DULoxetine (CYMBALTA) 60 MG capsule Take 60 mg by mouth every morning.     . gabapentin (NEURONTIN) 100 MG capsule Take 1 capsule (100 mg total) by mouth at bedtime. 30 capsule 6  . HYDROcodone-acetaminophen (NORCO) 10-325 MG tablet Take 1 tablet by mouth.    . nystatin cream (MYCOSTATIN) Apply 1 application topically 3 (three) times daily. 30 g 0  . tamoxifen (NOLVADEX) 10 MG tablet Take 10 mg by mouth daily.     No current facility-administered medications for this visit.    Allergies:  Avelox [moxifloxacin hcl in nacl]   Social History: The patient  reports that she quit smoking about 56 years ago. Her smoking use included cigarettes. She has a 1.00 pack-year smoking history. she has never used smokeless tobacco. She  reports that she does not drink alcohol or use drugs.   ROS:  Please see the history of present illness. Otherwise, complete review of systems is positive for insomnia.  All other systems are reviewed and negative.   Physical Exam: VS:  BP 126/76   Pulse (!) 55   Ht 5\' 5"  (1.651 m)   Wt 129 lb (58.5 kg)   SpO2 98%   BMI 21.47 kg/m , BMI Body mass index is 21.47 kg/m.  Wt Readings from Last 3 Encounters:  02/16/17 129 lb (58.5 kg)  12/21/16 126 lb 12.8 oz (57.5 kg)  12/08/16 126 lb 6.4 oz (57.3 kg)    General: Elderly woman, appears comfortable at rest. HEENT: Conjunctiva and lids normal, oropharynx  clear. Neck: Supple, no elevated JVP or carotid bruits, no thyromegaly. Lungs: Clear to auscultation, nonlabored breathing at rest. Cardiac: Regular rate and rhythm, no S3, soft systolic murmur, no pericardial rub. Abdomen: Soft, nontender, bowel sounds present. Extremities: No pitting edema, distal pulses 2+  ECG: I personally reviewed the tracing from 10/11/2015 which showed sinus rhythm.  Recent Labwork: 12/21/2016: ALT 8; AST 15; BUN 14.1; Creatinine 0.9; HGB 11.2; Platelets 179; Potassium 4.2; Sodium 139     Component Value Date/Time   CHOL 209 (H) 12/13/2014 1132   CHOL 150 02/17/2013 0934   TRIG 95 12/13/2014 1132   TRIG 111 02/17/2013 0934   HDL 114 12/13/2014 1132   HDL 40 02/17/2013 0934   CHOLHDL 1.8 12/13/2014 1132   VLDL 19 12/13/2014 1132   LDLCALC 76 12/13/2014 1132   LDLCALC 88 02/17/2013 0934    Other Studies Reviewed Today:  Echocardiogram 02/18/2016: Study Conclusions  - Left ventricle: The cavity size was normal. Wall thickness was   increased in a pattern of mild LVH. Systolic function was normal.   The estimated ejection fraction was in the range of 55% to 60%.   Wall motion was normal; there were no regional wall motion   abnormalities. Doppler parameters are consistent with abnormal   left ventricular relaxation (grade 1 diastolic dysfunction). - Aortic valve: Mildly calcified annulus. Trileaflet. There was   mild regurgitation. - Mitral valve: There was mild regurgitation. - Right atrium: Central venous pressure (est): 3 mm Hg. - Atrial septum: No defect or patent foramen ovale was identified. - Tricuspid valve: There was mild regurgitation. - Pulmonary arteries: PA peak pressure: 22 mm Hg (S).  Impressions:  - Mild LVH with LVEF 55-60% and grade 1 diastolic dysfunction. Mild   mitral regurgitation. Mildly calcified aortic annulus with mild   aortic regurgitation. Mild tricuspid regurgitation with normal   estimated PASP 22 mmHg.  Assessment  and Plan:  1.  Previously documented PVCs.  She reports occasional palpitations but no progression, no syncope or chest pain.  ECG today shows no ventricular ectopy.  LVEF was normal range by echocardiogram in December 2017.  We will continue with observation at this point.  2.  History of right breast cancer, continues to follow with oncology.  Current medicines were reviewed with the patient today.   Orders Placed This Encounter  Procedures  . EKG 12-Lead    Disposition: Follow-up in 1 year.  Signed, Satira Sark, MD, [facility] 02/16/2017 1:29 PM    [facility] [address] [place], [place] [address] Phone: [phone]; Fax: [phone]

## 2017-02-16 NOTE — Patient Instructions (Signed)
Your physician wants you to follow-up in: 1 year with Dr.McDowell You will receive a reminder letter in the mail two months in advance. If you don't receive a letter, please call our office to schedule the follow-up appointment.    Your physician recommends that you continue on your current medications as directed. Please refer to the Current Medication list given to you today.    If you need a refill on your cardiac medications before your next appointment, please call your pharmacy.     No lab work or tests ordered today.      Thank you for choosing [facility] !        

## 2017-02-25 DIAGNOSIS — Z1389 Encounter for screening for other disorder: Secondary | ICD-10-CM | POA: Diagnosis not present

## 2017-02-25 DIAGNOSIS — F329 Major depressive disorder, single episode, unspecified: Secondary | ICD-10-CM | POA: Diagnosis not present

## 2017-02-25 DIAGNOSIS — G894 Chronic pain syndrome: Secondary | ICD-10-CM | POA: Diagnosis not present

## 2017-02-25 DIAGNOSIS — F419 Anxiety disorder, unspecified: Secondary | ICD-10-CM | POA: Diagnosis not present

## 2017-02-25 DIAGNOSIS — Z6823 Body mass index (BMI) 23.0-23.9, adult: Secondary | ICD-10-CM | POA: Diagnosis not present

## 2017-03-17 ENCOUNTER — Telehealth: Payer: Self-pay

## 2017-03-17 NOTE — Telephone Encounter (Signed)
Left VM reminding patient of SCP visit on 03/25/17 at 10 am. Encouraged pt to call center with questions or concerns.

## 2017-03-25 ENCOUNTER — Inpatient Hospital Stay: Payer: Medicare Other | Attending: Adult Health | Admitting: Adult Health

## 2017-03-25 ENCOUNTER — Encounter: Payer: Self-pay | Admitting: Adult Health

## 2017-03-25 VITALS — BP 130/73 | HR 78 | Temp 98.5°F | Resp 18 | Wt 132.7 lb

## 2017-03-25 DIAGNOSIS — Z170 Estrogen receptor positive status [ER+]: Secondary | ICD-10-CM | POA: Diagnosis not present

## 2017-03-25 DIAGNOSIS — R609 Edema, unspecified: Secondary | ICD-10-CM | POA: Diagnosis not present

## 2017-03-25 DIAGNOSIS — C50411 Malignant neoplasm of upper-outer quadrant of right female breast: Secondary | ICD-10-CM | POA: Diagnosis not present

## 2017-03-25 DIAGNOSIS — M8580 Other specified disorders of bone density and structure, unspecified site: Secondary | ICD-10-CM | POA: Insufficient documentation

## 2017-03-25 DIAGNOSIS — Z79810 Long term (current) use of selective estrogen receptor modulators (SERMs): Secondary | ICD-10-CM | POA: Diagnosis not present

## 2017-03-25 NOTE — Progress Notes (Signed)
CLINIC:  Survivorship   REASON FOR VISIT:  Routine follow-up post-treatment for a recent history of breast cancer.  BRIEF ONCOLOGIC HISTORY:    Malignant neoplasm of upper-outer quadrant of right breast in female, estrogen receptor positive ([place])   05/19/2016 Initial Biopsy    Right breast upper outer quadrant biopsy: ILC, ER/PR+, HER-2 negative, Ki-67 15%      06/22/2016 Initial Diagnosis    Malignant neoplasm of upper-outer quadrant of right breast in female, estrogen receptor positive ([place])      07/31/2016 Surgery    Right lumpectomy: ILC, grade 2, 2.6 cm, margins neg, 3LN neg, T2, N0      09/01/2016 - 09/29/2016 Radiation Therapy    Adjuvant radiation: 1. The Right breast was treated to 42.5 Gy in 17 fractions at 2.5 Gy per fraction. 2. The Right breast was boosted to 7.5 Gy in 3 fractions at 2.5 Gy per fraction.         10/2016 -  Anti-estrogen oral therapy    Tamoxifen daily.  Bone Density on 04/22/16 shows T score in right femur of -2.0 and right radius of -2.4       INTERVAL HISTORY:  Marilyn Haas presents to the [facility] today for our initial meeting to review her survivorship care plan detailing her treatment course for breast cancer, as well as monitoring long-term side effects of that treatment, education regarding health maintenance, screening, and overall wellness and health promotion.     Overall, Marilyn Haas reports feeling quite well.  She is taking Tamoxifen daily and is tolerating it well.  She has a vaginal discharge, since starting, but is not itchy and it is not odorous.  She denies any vaginal bleeding.  She does have some mild hot flashes, but since being prescribed Gabapentin, those have resolved.     REVIEW OF SYSTEMS:  Review of Systems  Constitutional: Negative for appetite change, chills, fatigue, fever and unexpected weight change.  HENT:   Negative for hearing loss, lump/mass and trouble swallowing.   Eyes: Negative for eye problems and  icterus.  Respiratory: Negative for chest tightness, cough and shortness of breath.   Cardiovascular: Negative for chest pain, leg swelling and palpitations.  Gastrointestinal: Negative for abdominal distention, abdominal pain, constipation, diarrhea, nausea and vomiting.  Endocrine: Negative for hot flashes.  Musculoskeletal: Negative for arthralgias.  Skin: Negative for itching and rash.  Neurological: Negative for dizziness, extremity weakness, headaches and numbness.  Hematological: Negative for adenopathy. Does not bruise/bleed easily.  Psychiatric/Behavioral: Negative for depression. The patient is not nervous/anxious.   Breast: Denies any new nodularity, masses, tenderness, nipple changes, or nipple discharge.      ONCOLOGY TREATMENT TEAM:  1. Surgeon:  Dr. Dalbert Batman at [facility] 2. Medical Oncologist: Dr. Jana Hakim  3. Radiation Oncologist: Dr. Lisbeth Renshaw    PAST MEDICAL/SURGICAL HISTORY:  Past Medical History:  Diagnosis Date  . Anemia   . Anxiety   . Arthritis   . Breast cancer ([place]) 05/19/2016   Right breast  . Childhood asthma   . Chronic back pain    Scoliosis, stenosis  . Depression   . Diverticulosis   . GERD (gastroesophageal reflux disease)   . Hard of hearing   . Headache   . History of blood transfusion   . History of colon polyps   . History of hiatal hernia   . Insomnia   . Osteoporosis   . Pneumonia    20+ yrs ago   Past Surgical History:  Procedure Laterality Date  . BREAST LUMPECTOMY WITH RADIOACTIVE SEED AND SENTINEL LYMPH NODE BIOPSY Right 07/31/2016   Procedure: BREAST LUMPECTOMY WITH RADIOACTIVE SEED AND SENTINEL LYMPH NODE BIOPSY, INJECT BLUE DYE RIGHT BREAST;  Surgeon: Fanny Skates, MD;  Location: [facility];  Service: General;  Laterality: Right;  . COLONOSCOPY    . 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 07/04/2014   Procedure: LEFT CONVERSION TO TOTAL HIP ARTHROPLASTY;  Surgeon: Gaynelle Arabian, MD;  Location: [facility];  Service: Orthopedics;   Laterality: Left;  . ESOPHAGOGASTRODUODENOSCOPY N/A 01/26/2014   Procedure: ESOPHAGOGASTRODUODENOSCOPY (EGD);  Surgeon: Rogene Houston, MD;  Location: [facility];  Service: Endoscopy;  Laterality: N/A;  1030  . HIP FRACTURE SURGERY     05/2009 left -[facility]  . MALONEY DILATION N/A 01/26/2014   Procedure: Venia Minks DILATION;  Surgeon: Rogene Houston, MD;  Location: [facility];  Service: Endoscopy;  Laterality: N/A;     ALLERGIES:  Allergies  Allergen Reactions  . Avelox [Moxifloxacin Hcl In Nacl] Other (See Comments)    Altered mental status     CURRENT MEDICATIONS:  Outpatient Encounter Medications as of 03/25/2017  Medication Sig Note  . ALPRAZolam (XANAX) 0.5 MG tablet Take 0.5 mg by mouth at bedtime as needed for anxiety.   . DULoxetine (CYMBALTA) 60 MG capsule Take 60 mg by mouth every morning.  06/22/2016: 1 capsule 60 mg  Daily now   . gabapentin (NEURONTIN) 100 MG capsule Take 1 capsule (100 mg total) by mouth at bedtime.   Marland Kitchen HYDROcodone-acetaminophen (NORCO) 10-325 MG tablet Take 1 tablet by mouth.   . nystatin cream (MYCOSTATIN) Apply 1 application topically 3 (three) times daily.   . tamoxifen (NOLVADEX) 10 MG tablet Take 10 mg by mouth daily.    No facility-administered encounter medications on file as of 03/25/2017.      ONCOLOGIC FAMILY HISTORY:  Family History  Problem Relation Age of Onset  . Heart failure Mother   . Diabetes Mother   . Hypertension Mother   . Hypertension Sister   . Heart disease Sister        Premature CAD  . Cancer Maternal Aunt        breast     GENETIC COUNSELING/TESTING: Not indicated  SOCIAL HISTORY:  Marilyn Haas is widowed and lives alone in [place], [place].  She has 4 sons and they live in [place], [place], [place], and [place].  Marilyn Haas is currently retired.  She denies any current or history of tobacco, alcohol, or illicit drug use.     PHYSICAL EXAMINATION:  Vital Signs:   Vitals:   03/25/17  1027  BP: 130/73  Pulse: 78  Resp: 18  Temp: 98.5 F (36.9 C)  SpO2: 99%   Filed Weights   03/25/17 1027  Weight: 132 lb 11.2 oz (60.2 kg)   General: Well-nourished, well-appearing female in no acute distress.  She is unaccompanied today.   HEENT: Head is normocephalic.  Pupils equal and reactive to light. Conjunctivae clear without exudate.  Sclerae anicteric. Oral mucosa is pink, moist.  Oropharynx is pink without lesions or erythema.  Lymph: No cervical, supraclavicular, or infraclavicular lymphadenopathy noted on palpation.  Cardiovascular: Regular rate and rhythm.Marland Kitchen Respiratory: Clear to auscultation bilaterally. Chest expansion symmetric; breathing non-labored.  GI: Abdomen soft and round; non-tender, non-distended. Bowel sounds normoactive.  GU: Deferred.  Neuro: No focal deficits. Steady gait.  Psych: Mood and affect normal and appropriate for situation.  Extremities: No edema. MSK: No focal  spinal tenderness to palpation.  Full range of motion in bilateral upper extremities Skin: Warm and dry.  LABORATORY DATA:  None for this visit.  DIAGNOSTIC IMAGING:  None for this visit.      ASSESSMENT AND PLAN:  Ms.. Haas is a pleasant 77 y.o. 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  She presents to the Survivorship Clinic for our initial meeting and routine follow-up post-completion of treatment for breast cancer.    1. Stage IA right breast cancer:  Marilyn Haas is continuing to recover from definitive treatment for breast cancer. She will follow-up with her medical oncologist, Dr. Jana Hakim in 04/2017 with history and physical exam per surveillance protocol.  She will continue her anti-estrogen therapy with Tamoxifen. Thus far, she is tolerating the Tamoxifen well, with minimal side effects. Her vaginal  discharge is likely a side effect of Tamoxifen.  I did review with her today, that she does need to get in with GYN and undergo annual gynecological eval while taking Tamoxifen. Today, a comprehensive survivorship care plan and treatment summary was reviewed with the patient today detailing her breast cancer diagnosis, treatment course, potential late/long-term effects of treatment, appropriate follow-up care with recommendations for the future, and patient education resources.  A copy of this summary, along with a letter will be sent to the patient's primary care provider via mail/fax/In Basket message after today's visit.  Her mammogram, due in 04/2017 was ordered today.  2. Right breast swelling: This breast is slightly more swollen on the right from radiation and surgery.  She will see Dr. Jana Hakim next month and if it isn't any better would consider PT eval for breast lymphedema.   3. Bone health:  Given Marilyn Haas's age/history of breast cancer , she is at risk for bone demineralization.  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  I will defer to her PCP for future bone density testing and management.  I counseled her that Tamoxifen can have a protective effect on her bones. She was given education on specific activities to promote bone health.  4. Cancer screening:  Due to Marilyn Haas history and her age, she should receive screening for skin cancers, colon cancer, and gynecologic cancers.  The information and recommendations are listed on the patient's comprehensive care plan/treatment summary and were reviewed in detail with the patient.    5. Health maintenance and wellness promotion: Marilyn Haas was encouraged to consume 5-7 servings of fruits and vegetables per day. We reviewed the "Nutrition Rainbow" handout, as well as the handout "Take Control of Your Health and Reduce Your Cancer Risk" from the [place].  She was  also encouraged to engage in moderate to vigorous exercise for 30 minutes per day most days of the week. We discussed the [employer] fitness program, which is designed for cancer survivors to help them become more physically fit after cancer treatments.  She was instructed to limit her alcohol consumption and continue to abstain from tobacco use.     6. Support services/counseling: It is not uncommon for this period of the patient's cancer care trajectory to be one of many emotions and stressors.  We discussed an opportunity for her to participate in the next session of [facility] ("Finding Your New Normal") support group series designed for patients after they  have completed treatment.   Marilyn Haas was encouraged to take advantage of our many other support services programs, support groups, and/or counseling in coping with her new life as a cancer survivor after completing anti-cancer treatment.  She was offered support today through active listening and expressive supportive counseling.  She was given information regarding our available services and encouraged to contact me with any questions or for help enrolling in any of our support group/programs.    Dispo:   -Return to cancer center for follow up with Dr. Jana Hakim in 04/2017 -Mammogram due in 04/2017 -Follow up with Dr. Dalbert Batman 07/2017 -Bone density due in 04/2018 -She is welcome to return back to the Survivorship Clinic at any time; no additional follow-up needed at this time.  -Consider referral back to survivorship as a long-term survivor for continued surveillance  A total of (30) minutes of face-to-face time was spent with this patient with greater than 50% of that time in counseling and care-coordination.   Gardenia Phlegm, NP Survivorship Program [facility] [phone]   Note: PRIMARY CARE PROVIDER [employer], [place] [phone]

## 2017-04-08 DIAGNOSIS — M7052 Other bursitis of knee, left knee: Secondary | ICD-10-CM | POA: Diagnosis not present

## 2017-04-08 DIAGNOSIS — M25562 Pain in left knee: Secondary | ICD-10-CM | POA: Diagnosis not present

## 2017-04-09 DIAGNOSIS — M7052 Other bursitis of knee, left knee: Secondary | ICD-10-CM | POA: Diagnosis not present

## 2017-05-05 ENCOUNTER — Ambulatory Visit: Payer: Medicare Other | Admitting: Oncology

## 2017-05-14 DIAGNOSIS — F5101 Primary insomnia: Secondary | ICD-10-CM | POA: Diagnosis not present

## 2017-05-14 DIAGNOSIS — F330 Major depressive disorder, recurrent, mild: Secondary | ICD-10-CM | POA: Diagnosis not present

## 2017-05-14 DIAGNOSIS — G894 Chronic pain syndrome: Secondary | ICD-10-CM | POA: Diagnosis not present

## 2017-05-14 DIAGNOSIS — Z6822 Body mass index (BMI) 22.0-22.9, adult: Secondary | ICD-10-CM | POA: Diagnosis not present

## 2017-05-24 ENCOUNTER — Ambulatory Visit: Payer: Medicare Other | Admitting: Oncology

## 2017-06-03 NOTE — Progress Notes (Signed)
[place]  [phone] [phone]) [phone]     ID: [name] DOB: 1940-07-11  MR#: [id number]  JQZ#:[id number]  Patient Care Team: Redmond School, MD as PCP - General (Internal Medicine) Magrinat, Virgie Dad, MD as Consulting Physician (Oncology) Fanny Skates, MD as Consulting Physician (General Surgery) Kyung Rudd, MD as Consulting Physician (Radiation Oncology) Rogene Houston, MD as Consulting Physician (Gastroenterology) Gaynelle Arabian, MD as Consulting Physician (Orthopedic Surgery) Delice Bison, Charlestine Massed, NP as Nurse Practitioner (Hematology and Oncology) OTHER MD:  CHIEF COMPLAINT: Estrogen receptor positive lobular breast cancer  CURRENT TREATMENT: Tamoxifen  BREAST CANCER HISTORY: From the original intake note:  The patient had bilateral screening mammography at the [facility] 04/22/2016 showing an area of possible asymmetry and calcifications in the right breast. Right diagnostic mammography with tomography and right breast ultrasonography 05/12/2016 showed the breast density to be category C. In the right breast upper outer quadrant there was a. A real lower area of architectural distortion which was palpable in the 11:00 radians 1 cm from the nipple. Ultrasonography confirmed an irregular hypoechoic mass at this location measuring 1.4 cm. Ultrasound of the right axilla was unremarkable.  Biopsy of the right breast mass in question 05/19/2016 showed ([id number]) and invasive lobular carcinoma, E-cadherin negative, estrogen receptor 90% positive, progesterone receptor 15% positive, both with strong staining intensity, with an MIB-1 of 5%, and no HER-2 amplification, the signals ratio being 1.51 and the number per cell 3.10.  Breast MRI 06/08/2016 confirmed an irregular enhancing mass in the upper-outer quadrant of the right breast measuring 1.5 cm, with adjacent biopsy clip artifact. There was an area of contiguous non-mass like  enhancement bringing the total dimension to 1.9 cm. However there was no evidence of multicentric disease and no evidence of metastatic lymphadenopathy.  The patient's subsequent history is as detailed below   INTERVAL HISTORY: [place] returns today for follow-up and treatment of her estrogen receptor positive breast cancer. She continues on tamoxifen, with good tolerance except for continuing problems with vaginal discharge which is a major nuisance for her.  Fortunately hot flashes are minor  REVIEW OF SYSTEMS: [place] reports that she started experiencing chronic back pain after she had left hip implant surgery. She also has scoliosis. She took physical therapy for her hip, but not for her back. She notes that she lost her husband in July 2018, who had various medical problems along with being diagnosed with cancer. Her sons helped take care of her and her husband. [place] also says that her sister-in-law and children were murdered. The patient's sister is dealing with medical issues due to diabetes and recently had leg amputation. [place] has been helping her sister as well. She feels exhausted from all the bad news and from taking care of her family over the last year. She is not exercising right now. She was started on an anti-depressant, but this hasn't been helping. She denies unusual headaches, visual changes, nausea, vomiting, or dizziness. There has been no unusual cough, phlegm production, or pleurisy. This been no change in bowel or bladder habits. She denies unexplained fatigue or unexplained weight loss, bleeding, rash, or fever. A detailed review of systems was otherwise stable.    PAST MEDICAL HISTORY: Past Medical History:  Diagnosis Date  . Anemia   . Anxiety   . Arthritis   . Breast cancer ([place]) 05/19/2016   Right breast  . Childhood asthma   . Chronic back pain    Scoliosis, stenosis  .  Depression   . Diverticulosis   . GERD (gastroesophageal reflux disease)   .  Hard of hearing   . Headache   . History of blood transfusion   . History of colon polyps   . History of hiatal hernia   . Insomnia   . Osteoporosis   . Pneumonia    20+ yrs ago    PAST SURGICAL HISTORY: Past Surgical History:  Procedure Laterality Date  . BREAST LUMPECTOMY WITH RADIOACTIVE SEED AND SENTINEL LYMPH NODE BIOPSY Right 07/31/2016   Procedure: BREAST LUMPECTOMY WITH RADIOACTIVE SEED AND SENTINEL LYMPH NODE BIOPSY, INJECT BLUE DYE RIGHT BREAST;  Surgeon: Fanny Skates, MD;  Location: [facility];  Service: General;  Laterality: Right;  . COLONOSCOPY    . 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 ESOPHAGOGASTRODUODENOSCOPY N/A 01/26/2014   Procedure: ESOPHAGOGASTRODUODENOSCOPY (EGD);  Surgeon: Rogene Houston, MD;  [facility];  Service: Endoscopy;  Laterality: N/A;  1030  . HIP FRACTURE SURGERY     05/2009 left -[facility]  . MALONEY DILATION N/A 01/26/2014   Procedure: Venia Minks DILATION;  Surgeon: Rogene Houston, MD;  [facility];  Service: Endoscopy;  Laterality: N/A;    FAMILY HISTORY Family History  Problem Relation Age of Onset  . Heart failure Mother   . Diabetes Mother   . Hypertension Mother   . Hypertension Sister   . Heart disease Sister        Premature CAD  . Cancer Maternal Aunt        breast  The patient's father died at age 75 from rupture of an abdominal aortic aneurysm. The patient's mother died with heart failure in the setting of diabetes at age 74. The patient had no brothers, 2 sisters. One sister was diagnosed with breast cancer at age 21. Her other sister has had complications from diabetes. One maternal aunt was diagnosed with breast cancer in her 30s  GYNECOLOGIC HISTORY:  No LMP recorded. Patient is postmenopausal. Menarche age 40, first live birth age 17, which the patient is aware  increases the risk of breast cancer. She is GX P3. She does not quite recall when she went through the change of life but thinks he was in her 32s. She did not use hormone replacement.  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 Her husband "Luciana Axe" Gardiner, 83, was in Press photographer. He passed away in 10/21/2016, due to cancer and various other medical problems. Son, [name] lives in [place] where he works in Publishing rights manager. Son [name] lives in [place] and works in Forensic scientist. Son [name] lives in [place] and works as a Dealer. The patient has 12 grandchildren and 2 great-grandchildren. She is a Psychologist, forensic.     ADVANCED DIRECTIVES: Not addressed at 06/22/2016 meeting   HEALTH MAINTENANCE: Social History   Tobacco Use  . Smoking status: Former Smoker    Packs/day: 0.50    Years: 2.00    Pack years: 1.00    Types: Cigarettes    Last attempt to quit: 01/15/1961    Years since quitting: 56.4  . Smokeless tobacco: Never Used  Substance Use Topics  . Alcohol use: No    Alcohol/week: 0.0 oz  . Drug use: No     Colonoscopy: UTD/[facility]  PAP: remote  Bone density: 04/22/2016   Allergies  Allergen  Reactions  . Avelox [Moxifloxacin Hcl In Nacl] Other (See Comments)    Altered mental status    Current Outpatient Medications  Medication Sig Dispense Refill  . ALPRAZolam (XANAX) 0.5 MG tablet Take 0.5 mg by mouth at bedtime as needed for anxiety.    . DULoxetine (CYMBALTA) 60 MG capsule Take 60 mg by mouth every morning.     . gabapentin (NEURONTIN) 100 MG capsule Take 1 capsule (100 mg total) by mouth at bedtime. 30 capsule 6  . HYDROcodone-acetaminophen (NORCO) 10-325 MG tablet Take 1 tablet by mouth.    . nystatin cream (MYCOSTATIN) Apply 1 application topically 3 (three) times daily. 30 g 0  . tamoxifen (NOLVADEX) 10 MG tablet Take 1 tablet (10 mg total) by mouth daily. 90 tablet 4   No current facility-administered medications for this visit.      OBJECTIVE: Older white woman who was tearful during today's visit  Vitals:   06/08/17 1313  BP: 129/83  Pulse: 90  Resp: 18  Temp: 98.3 F (36.8 C)  SpO2: 100%     Body mass index is 21.33 kg/m.    ECOG FS:2 - Symptomatic, <50% confined to bed  Sclerae unicteric, EOMs intact Oropharynx clear and moist No cervical or supraclavicular adenopathy Lungs no rales or rhonchi Heart regular rate and rhythm Abd soft, nontender, positive bowel sounds MSK kyphosis and scoliosis but no focal spinal tenderness, no upper extremity lymphedema Neuro: nonfocal, well oriented, breast affect Breasts: Right breast is status post lumpectomy and radiation.  There is the expected coarseness of the skin and some hyperpigmentation but no evidence of local recurrence.  The left breast is benign.  Both axillae are benign.  LAB RESULTS:  CMP     Component Value Date/Time   NA 139 12/21/2016 0931   K 4.2 12/21/2016 0931   CL 100 (L) 07/23/2016 1352   CO2 28 12/21/2016 0931   GLUCOSE 86 12/21/2016 0931   BUN 14.1 12/21/2016 0931   CREATININE 0.9 12/21/2016 0931   CALCIUM 8.8 12/21/2016 0931   PROT 7.0 12/21/2016 0931   ALBUMIN 3.7 12/21/2016 0931   AST 15 12/21/2016 0931   ALT 8 12/21/2016 0931   ALKPHOS 62 12/21/2016 0931   BILITOT 0.40 12/21/2016 0931   GFRNONAA 59 (L) 07/23/2016 1352   GFRAA >60 07/23/2016 1352    No results found for: TOTALPROTELP, ALBUMINELP, A1GS, A2GS, BETS, BETA2SER, GAMS, MSPIKE, SPEI  No results found for: Nils Pyle, [facility]  Lab Results  Component Value Date   WBC 4.9 12/21/2016   NEUTROABS 2.7 12/21/2016   HGB 11.2 (L) 12/21/2016   HCT 35.0 12/21/2016   MCV 94.1 12/21/2016   PLT 179 12/21/2016      Chemistry      Component Value Date/Time   NA 139 12/21/2016 0931   K 4.2 12/21/2016 0931   CL 100 (L) 07/23/2016 1352   CO2 28 12/21/2016 0931   BUN 14.1 12/21/2016 0931   CREATININE 0.9 12/21/2016 0931      Component Value  Date/Time   CALCIUM 8.8 12/21/2016 0931   ALKPHOS 62 12/21/2016 0931   AST 15 12/21/2016 0931   ALT 8 12/21/2016 0931   BILITOT 0.40 12/21/2016 0931       No results found for: LABCA2  No components found for: [id number]  No results for input(s): INR in the last 168 hours.  Urinalysis    Component Value Date/Time   COLORURINE YELLOW 10/11/2015 [place] 10/11/2015 1211  LABSPEC <1.005 (L) 10/11/2015 1211   PHURINE 6.5 10/11/2015 1211   GLUCOSEU 100 (A) 10/11/2015 1211   HGBUR TRACE (A) 10/11/2015 1211   BILIRUBINUR NEGATIVE 10/11/2015 1211   KETONESUR NEGATIVE 10/11/2015 1211   PROTEINUR NEGATIVE 10/11/2015 1211   UROBILINOGEN 1.0 06/26/2014 1400   NITRITE NEGATIVE 10/11/2015 1211   LEUKOCYTESUR NEGATIVE 10/11/2015 1211     STUDIES: She is a bit behind on mammography  ELIGIBLE FOR AVAILABLE RESEARCH PROTOCOL: no  ASSESSMENT: 77 y.o. [place] woman status post right breast upper outer quadrant biopsy 05/19/2016 for a clinical T1c N0, stage IA invasive lobular breast cancer, estrogen and progesterone receptor positive, HER-2 nonamplified, with an MIB-15%.   (1) Status post right lumpectomy and sentinel lymph node sampling 07/31/2016 for a pT2 pN0, stage IB invasive lobular carcinoma, grade 2, with negative margins  (2) adjuvant radiation  09/01/2016 to 09/29/2016 Site/dose:    1. The Right breast was treated to 42.5 Gy in 17 fractions at 2.5 Gy per fraction. 2. The Right breast was boosted to 7.5 Gy in 3 fractions at 2.5 Gy per fraction.    (3)  started tamoxifen 10/26/2016  (a) bone density 04/22/2016 shows a T score in the right femur of -2.0, right radius -2.4   PLAN:  [place] is feeling overwhelmed by the loss of her husband, the illness of her sister, and other family tragedies.  She is living alone in an enormous house and does not know how she is going to get any thing done that she needs to do  From a breast cancer point of view there is  no evidence of disease recurrence and that is very favorable.  She is behind on mammography.  I have put it in for her to have bilateral mammography with [facility] within the next month  She is not doing so well on tamoxifen because of the vaginal wetness issue.  We are going to switch to anastrozole.  We discussed the possible toxicity side effects and complications of this agent and despite that her significant bony issues we are going to switch.  If she does tolerated well I will probably start her on Prolia  I am going to see her again in 2-3 months just to make sure she is tolerating anastrozole well  She knows to call for any other issues that may develop before that visit.  Magrinat, Virgie Dad, MD  06/08/17 1:47 PM Medical Oncology and Hematology [facility] [address] [place], [place] [address] Tel. [phone]    Fax. [phone]  This document serves as a record of services personally performed by Lurline Del, MD. It was created on his behalf by Sheron Nightingale, a trained medical scribe. The creation of this record is based on the scribe's personal observations and the provider's statements to them.   I have reviewed the above documentation for accuracy and completeness, and I agree with the above.

## 2017-06-08 ENCOUNTER — Inpatient Hospital Stay: Payer: Medicare Other | Attending: Adult Health | Admitting: Oncology

## 2017-06-08 ENCOUNTER — Telehealth: Payer: Self-pay | Admitting: Oncology

## 2017-06-08 ENCOUNTER — Telehealth: Payer: Self-pay

## 2017-06-08 VITALS — BP 129/83 | HR 90 | Temp 98.3°F | Resp 18 | Ht 65.0 in | Wt 128.2 lb

## 2017-06-08 DIAGNOSIS — C50411 Malignant neoplasm of upper-outer quadrant of right female breast: Secondary | ICD-10-CM | POA: Insufficient documentation

## 2017-06-08 DIAGNOSIS — Z96649 Presence of unspecified artificial hip joint: Secondary | ICD-10-CM

## 2017-06-08 DIAGNOSIS — T8484XD Pain due to internal orthopedic prosthetic devices, implants and grafts, subsequent encounter: Secondary | ICD-10-CM

## 2017-06-08 DIAGNOSIS — Z170 Estrogen receptor positive status [ER+]: Secondary | ICD-10-CM

## 2017-06-08 MED ORDER — TAMOXIFEN CITRATE 10 MG PO TABS: 10.0000 mg | ORAL_TABLET | Freq: Every day | ORAL | 4 refills | Status: DC

## 2017-06-08 MED ORDER — GABAPENTIN 100 MG PO CAPS: 100.0000 mg | ORAL_CAPSULE | Freq: Every day | ORAL | 6 refills | Status: DC

## 2017-06-08 MED ORDER — ANASTROZOLE 1 MG PO TABS: 1.0000 mg | ORAL_TABLET | Freq: Every day | ORAL | 4 refills | Status: DC

## 2017-06-08 NOTE — Telephone Encounter (Signed)
Gave avs and calendar ° °

## 2017-06-08 NOTE — Telephone Encounter (Signed)
Called pt son, [name] who is HPOA to inform her orders were placed for his mothers mammograms. They need to call [name] to schedule. No further questions at this time.  Cyndia Bent RN

## 2017-06-10 ENCOUNTER — Other Ambulatory Visit: Payer: Self-pay | Admitting: Oncology

## 2017-06-10 DIAGNOSIS — Z853 Personal history of malignant neoplasm of breast: Secondary | ICD-10-CM

## 2017-06-15 ENCOUNTER — Ambulatory Visit (HOSPITAL_COMMUNITY)
Admission: RE | Admit: 2017-06-15 | Discharge: 2017-06-15 | Disposition: A | Discharge status: Home / Self Care | Payer: Medicare Other | Source: Ambulatory Visit | Attending: Oncology | Admitting: Oncology

## 2017-06-15 ENCOUNTER — Encounter (HOSPITAL_COMMUNITY): Payer: Self-pay

## 2017-06-15 DIAGNOSIS — Z923 Personal history of irradiation: Secondary | ICD-10-CM | POA: Insufficient documentation

## 2017-06-15 DIAGNOSIS — Z96649 Presence of unspecified artificial hip joint: Secondary | ICD-10-CM

## 2017-06-15 DIAGNOSIS — T8484XD Pain due to internal orthopedic prosthetic devices, implants and grafts, subsequent encounter: Secondary | ICD-10-CM

## 2017-06-15 DIAGNOSIS — Z170 Estrogen receptor positive status [ER+]: Secondary | ICD-10-CM | POA: Diagnosis not present

## 2017-06-15 DIAGNOSIS — C50411 Malignant neoplasm of upper-outer quadrant of right female breast: Secondary | ICD-10-CM | POA: Diagnosis not present

## 2017-06-15 DIAGNOSIS — Z9889 Other specified postprocedural states: Secondary | ICD-10-CM | POA: Diagnosis not present

## 2017-06-15 DIAGNOSIS — R922 Inconclusive mammogram: Secondary | ICD-10-CM | POA: Diagnosis not present

## 2017-06-15 HISTORY — DX: Personal history of irradiation: Z92.3

## 2017-08-12 ENCOUNTER — Other Ambulatory Visit (HOSPITAL_COMMUNITY): Payer: Self-pay | Admitting: Physician Assistant

## 2017-08-12 ENCOUNTER — Ambulatory Visit (HOSPITAL_COMMUNITY)
Admission: RE | Admit: 2017-08-12 | Discharge: 2017-08-12 | Disposition: A | Discharge status: Home / Self Care | Payer: Medicare Other | Source: Ambulatory Visit | Attending: Physician Assistant | Admitting: Physician Assistant

## 2017-08-12 DIAGNOSIS — M546 Pain in thoracic spine: Secondary | ICD-10-CM | POA: Diagnosis not present

## 2017-08-12 DIAGNOSIS — M419 Scoliosis, unspecified: Secondary | ICD-10-CM | POA: Diagnosis not present

## 2017-08-12 DIAGNOSIS — E782 Mixed hyperlipidemia: Secondary | ICD-10-CM | POA: Diagnosis not present

## 2017-08-12 DIAGNOSIS — F330 Major depressive disorder, recurrent, mild: Secondary | ICD-10-CM | POA: Insufficient documentation

## 2017-08-12 DIAGNOSIS — M47815 Spondylosis without myelopathy or radiculopathy, thoracolumbar region: Secondary | ICD-10-CM | POA: Diagnosis not present

## 2017-08-12 DIAGNOSIS — Z1389 Encounter for screening for other disorder: Secondary | ICD-10-CM | POA: Diagnosis not present

## 2017-08-12 DIAGNOSIS — F419 Anxiety disorder, unspecified: Secondary | ICD-10-CM | POA: Insufficient documentation

## 2017-08-12 DIAGNOSIS — F5101 Primary insomnia: Secondary | ICD-10-CM | POA: Diagnosis not present

## 2017-08-12 DIAGNOSIS — K449 Diaphragmatic hernia without obstruction or gangrene: Secondary | ICD-10-CM | POA: Diagnosis not present

## 2017-08-12 DIAGNOSIS — I10 Essential (primary) hypertension: Secondary | ICD-10-CM | POA: Diagnosis not present

## 2017-08-12 DIAGNOSIS — Z6822 Body mass index (BMI) 22.0-22.9, adult: Secondary | ICD-10-CM | POA: Diagnosis not present

## 2017-08-12 DIAGNOSIS — C50411 Malignant neoplasm of upper-outer quadrant of right female breast: Secondary | ICD-10-CM | POA: Diagnosis not present

## 2017-08-12 DIAGNOSIS — C50919 Malignant neoplasm of unspecified site of unspecified female breast: Secondary | ICD-10-CM | POA: Diagnosis not present

## 2017-08-20 NOTE — Progress Notes (Signed)
[facility]  Telephone:([phone]) [phone] Fax:(336) [phone]     ID: Marilyn Haas DOB: 05-15-1940  MR#: [id number]  JYN#:[id number]  Patient Care Team: Elfredia Nevins, MD as PCP - General (Internal Medicine) Nevaeha Finerty, Valentino Hue, MD as Consulting Physician (Oncology) Claud Kelp, MD as Consulting Physician (General Surgery) Dorothy Puffer, MD as Consulting Physician (Radiation Oncology) Malissa Hippo, MD as Consulting Physician (Gastroenterology) Ollen Gross, MD as Consulting Physician (Orthopedic Surgery) Axel Filler, Larna Daughters, NP as Nurse Practitioner (Hematology and Oncology) OTHER MD:  CHIEF COMPLAINT: Estrogen receptor positive lobular breast cancer  CURRENT TREATMENT: tamoxifen  BREAST CANCER HISTORY: From the original intake note:  The patient had bilateral screening mammography at the [facility] 04/22/2016 showing an area of possible asymmetry and calcifications in the right breast. Right diagnostic mammography with tomography and right breast ultrasonography 05/12/2016 showed the breast density to be category C. In the right breast upper outer quadrant there was a. A real lower area of architectural distortion which was palpable in the 11:00 radians 1 cm from the nipple. Ultrasonography confirmed an irregular hypoechoic mass at this location measuring 1.4 cm. Ultrasound of the right axilla was unremarkable.  Biopsy of the right breast mass in question 05/19/2016 showed ([id number]) and invasive lobular carcinoma, E-cadherin negative, estrogen receptor 90% positive, progesterone receptor 15% positive, both with strong staining intensity, with an MIB-1 of 5%, and no HER-2 amplification, the signals ratio being 1.51 and the number per cell 3.10.  Breast MRI 06/08/2016 confirmed an irregular enhancing mass in the upper-outer quadrant of the right breast measuring 1.5 cm, with adjacent biopsy clip artifact. There was an area of contiguous non-mass like  enhancement bringing the total dimension to 1.9 cm. However there was no evidence of multicentric disease and no evidence of metastatic lymphadenopathy.  The patient's subsequent history is as detailed below   INTERVAL HISTORY: Marilyn Haas returns today for follow-up and treatment of her estrogen receptor positive breast cancer. She continues on anastrozole, with good tolerance. She is still experiencing hot flashes just as she was while on tamoxifen.   Since her last visit, she underwent diagnostic bilateral mammography with CAD and tomography on 06/15/2017 at [facility] showing: breast density category C. There was no evidence of malignancy.    REVIEW OF SYSTEMS: Marilyn Haas reports that her husband passed away in 13-Oct-2016. She notes that she is still healing. She feels very depressed. She was placed on anti-despression medications under her PCP. She notes that she was given medication to help her sleep at night. She has mild pain pain with she takes [facility] She has constipation from this which doesn't help because she also has IBS. She follows up with Dr. Dionicia Abler.  She denies unusual headaches, visual changes, nausea, vomiting, or dizziness. There has been no unusual cough, phlegm production, or pleurisy. This been no change in bowel or bladder habits. She denies unexplained fatigue or unexplained weight loss, bleeding, rash, or fever. A detailed review of systems was otherwise stable.    PAST MEDICAL HISTORY: Past Medical History:  Diagnosis Date  . Anemia   . Anxiety   . Arthritis   . Breast cancer (HCC) 05/19/2016   Right breast  . Childhood asthma   . Chronic back pain    Scoliosis, stenosis  . Depression   . Diverticulosis   . GERD (gastroesophageal reflux disease)   . Hard of hearing   . Headache   . History of blood transfusion   . History of  colon polyps   . History of hiatal hernia   . Insomnia   . Osteoporosis   . Personal history of radiation therapy   . Pneumonia     20+ yrs ago    PAST SURGICAL HISTORY: Past Surgical History:  Procedure Laterality Date  . BREAST LUMPECTOMY WITH RADIOACTIVE SEED AND SENTINEL LYMPH NODE BIOPSY Right 07/31/2016   Procedure: BREAST LUMPECTOMY WITH RADIOACTIVE SEED AND SENTINEL LYMPH NODE BIOPSY, INJECT BLUE DYE RIGHT BREAST;  Surgeon: Claud Kelp, MD;  Location: [facility];  Service: General;  Laterality: Right;  . COLONOSCOPY    . 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 07/04/2014   Procedure: LEFT CONVERSION TO TOTAL HIP ARTHROPLASTY;  Surgeon: Ollen Gross, MD;  Location: [facility];  Service: Orthopedics;  Laterality: Left;  . ESOPHAGOGASTRODUODENOSCOPY N/A 01/26/2014   Procedure: ESOPHAGOGASTRODUODENOSCOPY (EGD);  Surgeon: Malissa Hippo, MD;  [facility]: [facility];  Service: Endoscopy;  Laterality: N/A;  1030  . HIP FRACTURE SURGERY     05/2009 left -Turner Daniels  . MALONEY DILATION N/A 01/26/2014   Procedure: [name] DILATION;  Surgeon: Malissa Hippo, MD;  Location: [facility];  Service: Endoscopy;  Laterality: N/A;    FAMILY HISTORY Family History  Problem Relation Age of Onset  . Heart failure Mother   . Diabetes Mother   . Hypertension Mother   . Hypertension Sister   . Heart disease Sister        Premature CAD  . Cancer Maternal Aunt        breast  The patient's father died at age 74 from rupture of an abdominal aortic aneurysm. The patient's mother died with heart failure in the setting of diabetes at age 53. The patient had no brothers, 2 sisters. One sister was diagnosed with breast cancer at age 34. Her other sister has had complications from diabetes. One maternal aunt was diagnosed with breast cancer in her 56s  GYNECOLOGIC HISTORY:  No LMP recorded. Patient is postmenopausal. Menarche age 47, first live birth age 67, which the patient is aware increases the risk of breast cancer. She is GX P3. She does not quite recall when she went through the change of life but thinks he was in her 63s. She did not use hormone  replacement.  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 Her husband "Marilyn Cella" Haas, 83, was in Airline pilot. He passed away in 2018-08-12due to cancer and various other medical problems. Son, Trinna Post lives in [place] where he works in Armed forces training and education officer. Son Willaim Sheng lives in [place] and works in Magazine features editor. Son Cletis Athens lives in [place] and works as a Curator. The patient has 12 grandchildren and 2 great-grandchildren. She is a Control and instrumentation engineer.     ADVANCED DIRECTIVES: Not addressed at 06/22/2016 meeting   HEALTH MAINTENANCE: Social History   Tobacco Use  . Smoking status: Former Smoker    Packs/day: 0.50    Years: 2.00    Pack years: 1.00    Types: Cigarettes    Last attempt to quit: 01/15/1961    Years since quitting: 56.6  . Smokeless tobacco: Never Used  Substance Use Topics  . Alcohol use: No    Alcohol/week: 0.0 oz  . Drug use: No     Colonoscopy: UTD/Rehman  PAP: remote  Bone density: 04/22/2016   Allergies  Allergen Reactions  . Avelox [Moxifloxacin Hcl In Nacl] Other (See Comments)    Altered mental status    Current Outpatient Medications  Medication Sig  Dispense Refill  . ALPRAZolam (XANAX) 0.5 MG tablet Take 0.5 mg by mouth at bedtime as needed for anxiety.    Marland Kitchen anastrozole (ARIMIDEX) 1 MG tablet Take 1 tablet (1 mg total) by mouth daily. 90 tablet 4  . DULoxetine (CYMBALTA) 60 MG capsule Take 60 mg by mouth every morning.     . gabapentin (NEURONTIN) 100 MG capsule Take 1 capsule (100 mg total) by mouth at bedtime. 30 capsule 6  . HYDROcodone-acetaminophen (NORCO) 10-325 MG tablet Take 1 tablet by mouth.    . nystatin cream (MYCOSTATIN) Apply 1 application topically 3 (three) times daily. 30 g 0   No current facility-administered medications for this visit.     OBJECTIVE: Older white woman who appears stated age  Vitals:   08/23/17 1520  BP: 123/67  Pulse: 99  Resp: 18  Temp: 97.8 F (36.6 C)  SpO2: 97%     Body mass index is  21.78 kg/m.    ECOG FS:1 - Symptomatic but completely ambulatory  Sclerae unicteric, EOMs intact Oropharynx clear and moist No cervical or supraclavicular adenopathy Lungs no rales or rhonchi Heart regular rate and rhythm Abd soft, nontender, positive bowel sounds MSK no focal spinal tenderness, no upper extremity lymphedema Neuro: nonfocal, well oriented, appropriate affect Breasts: On the right the patient has undergone lumpectomy and radiation.  There is no evidence of disease recurrence.  There are postoperative and postradiation changes as expected.  The left breast is benign.  Both axillae are benign.  LAB RESULTS:  CMP     Component Value Date/Time   NA 139 12/21/2016 0931   K 4.2 12/21/2016 0931   CL 100 (L) 07/23/2016 1352   CO2 28 12/21/2016 0931   GLUCOSE 86 12/21/2016 0931   BUN 14.1 12/21/2016 0931   CREATININE 0.9 12/21/2016 0931   CALCIUM 8.8 12/21/2016 0931   PROT 7.0 12/21/2016 0931   ALBUMIN 3.7 12/21/2016 0931   AST 15 12/21/2016 0931   ALT 8 12/21/2016 0931   ALKPHOS 62 12/21/2016 0931   BILITOT 0.40 12/21/2016 0931   GFRNONAA 59 (L) 07/23/2016 1352   GFRAA >60 07/23/2016 1352    No results found for: TOTALPROTELP, ALBUMINELP, A1GS, A2GS, BETS, BETA2SER, GAMS, MSPIKE, SPEI  No results found for: Ron Parker, [facility]  Lab Results  Component Value Date   WBC 4.9 12/21/2016   NEUTROABS 2.7 12/21/2016   HGB 11.2 (L) 12/21/2016   HCT 35.0 12/21/2016   MCV 94.1 12/21/2016   PLT 179 12/21/2016      Chemistry      Component Value Date/Time   NA 139 12/21/2016 0931   K 4.2 12/21/2016 0931   CL 100 (L) 07/23/2016 1352   CO2 28 12/21/2016 0931   BUN 14.1 12/21/2016 0931   CREATININE 0.9 12/21/2016 0931      Component Value Date/Time   CALCIUM 8.8 12/21/2016 0931   ALKPHOS 62 12/21/2016 0931   AST 15 12/21/2016 0931   ALT 8 12/21/2016 0931   BILITOT 0.40 12/21/2016 0931       No results found for: LABCA2  No components  found for: [id number]  No results for input(s): INR in the last 168 hours.  Urinalysis    Component Value Date/Time   COLORURINE YELLOW 10/11/2015 1211   APPEARANCEUR CLEAR 10/11/2015 1211   LABSPEC <1.005 (L) 10/11/2015 1211   PHURINE 6.5 10/11/2015 1211   GLUCOSEU 100 (A) 10/11/2015 1211   HGBUR TRACE (A) 10/11/2015 1211   BILIRUBINUR NEGATIVE 10/11/2015  1211   KETONESUR NEGATIVE 10/11/2015 1211   PROTEINUR NEGATIVE 10/11/2015 1211   UROBILINOGEN 1.0 06/26/2014 1400   NITRITE NEGATIVE 10/11/2015 1211   LEUKOCYTESUR NEGATIVE 10/11/2015 1211     STUDIES: Dg Thoracic Spine W/swimmers  Result Date: 08/13/2017 CLINICAL DATA:  Upper back pain. No injury. History of scoliosis and breast cancer. EXAM: THORACIC SPINE - 3 VIEWS COMPARISON:  Chest x-ray 10/11/2015, 07/31/2015. FINDINGS: Thoracolumbar spine scoliosis again noted. Diffuse osteopenia and degenerative change. No acute bony abnormality. No evidence of fracture. Prominent sliding hiatal hernia. Surgical clips right chest. Pleural-parenchymal thickening noted over the right lung consistent with scarring. Similar findings noted on prior exam. IMPRESSION: 1.  Thoracolumbar spine scoliosis again noted. 2.  Sliding hiatal hernia. Electronically Signed   By: Maisie Fus  Register   On: 08/13/2017 08:44     ELIGIBLE FOR AVAILABLE RESEARCH PROTOCOL: no  ASSESSMENT: 77 y.o. [place] woman status post right breast upper outer quadrant biopsy 05/19/2016 for a clinical T1c N0, stage IA invasive lobular breast cancer, estrogen and progesterone receptor positive, HER-2 nonamplified, with an MIB-15%.   (1) Status post right lumpectomy and sentinel lymph node sampling 07/31/2016 for a pT2 pN0, stage IB invasive lobular carcinoma, grade 2, with negative margins  (2) adjuvant radiation  09/01/2016 to 09/29/2016 Site/dose:    1. The Right breast was treated to 42.5 Gy in 17 fractions at 2.5 Gy per fraction. 2. The Right breast was boosted to 7.5 Gy  in 3 fractions at 2.5 Gy per fraction.   (3)  started tamoxifen 10/26/2016  (a) bone density 04/22/2016 shows a T score in the right femur of -2.0, right radius -2.4   (b) switched to anastrozole August 2018 because of concerns for vaginal wetness and hot flashes with tamoxifen  (c) switched back to tamoxifen June 2019 as the patient felt there was no improvement in prior issues and she would like to "strengthen her bones".  PLAN:  Marilyn Haas is now just over a year out from definitive surgery for her breast cancer with no evidence of disease recurrence.  This is favorable.  She has tolerated antiestrogens moderately well.  She really does not feel there is a benefit to anastrozole as compared to tamoxifen.  Tamoxifen does have the advantage that it will help strengthen her bones and she has near osteoporosis  Accordingly we are going back to tamoxifen.  There is an interaction between tamoxifen and Cymbalta but this is not of clinical consequence.  Patient is to take both medications receive the same benefit from tamoxifen as does only take tamoxifen  She will return to see me in 6 months at her request.   She knows to call for any other issues that may develop before the next visit.  Halle Davlin, Valentino Hue, MD  08/23/17 3:36 PM Medical Oncology and Hematology [facility] [address] [place], [place] [address] Tel. [phone]    Fax. [phone]  Fonnie Birkenhead, am acting as scribe for Lowella Dell MD.  I, Ruthann Cancer MD, have reviewed the above documentation for accuracy and completeness, and I agree with the above.

## 2017-08-23 ENCOUNTER — Encounter: Payer: Self-pay | Admitting: *Deleted

## 2017-08-23 ENCOUNTER — Inpatient Hospital Stay: Payer: Medicare Other | Attending: Adult Health | Admitting: Oncology

## 2017-08-23 ENCOUNTER — Telehealth: Payer: Self-pay | Admitting: Oncology

## 2017-08-23 VITALS — BP 123/67 | HR 99 | Temp 97.8°F | Resp 18 | Ht 65.0 in | Wt 130.9 lb

## 2017-08-23 DIAGNOSIS — C50411 Malignant neoplasm of upper-outer quadrant of right female breast: Secondary | ICD-10-CM | POA: Diagnosis not present

## 2017-08-23 DIAGNOSIS — Z79811 Long term (current) use of aromatase inhibitors: Secondary | ICD-10-CM | POA: Diagnosis not present

## 2017-08-23 DIAGNOSIS — Z923 Personal history of irradiation: Secondary | ICD-10-CM | POA: Diagnosis not present

## 2017-08-23 DIAGNOSIS — Z87891 Personal history of nicotine dependence: Secondary | ICD-10-CM | POA: Diagnosis not present

## 2017-08-23 DIAGNOSIS — Z170 Estrogen receptor positive status [ER+]: Secondary | ICD-10-CM | POA: Insufficient documentation

## 2017-08-23 MED ORDER — TAMOXIFEN CITRATE 20 MG PO TABS: 20.0000 mg | ORAL_TABLET | Freq: Every day | ORAL | 12 refills | Status: AC

## 2017-08-23 NOTE — Telephone Encounter (Signed)
Gave patient avs and calendar of upcoming October appointments °

## 2017-08-26 DIAGNOSIS — E538 Deficiency of other specified B group vitamins: Secondary | ICD-10-CM | POA: Diagnosis not present

## 2017-08-26 DIAGNOSIS — F330 Major depressive disorder, recurrent, mild: Secondary | ICD-10-CM | POA: Diagnosis not present

## 2017-08-26 DIAGNOSIS — Z6822 Body mass index (BMI) 22.0-22.9, adult: Secondary | ICD-10-CM | POA: Diagnosis not present

## 2017-08-31 ENCOUNTER — Ambulatory Visit (INDEPENDENT_AMBULATORY_CARE_PROVIDER_SITE_OTHER): Payer: Medicare Other | Admitting: Internal Medicine

## 2017-08-31 ENCOUNTER — Encounter (INDEPENDENT_AMBULATORY_CARE_PROVIDER_SITE_OTHER): Payer: Self-pay | Admitting: Internal Medicine

## 2017-08-31 VITALS — BP 140/90 | HR 80 | Temp 100.0°F | Resp 18 | Ht 65.5 in | Wt 130.8 lb

## 2017-08-31 DIAGNOSIS — K581 Irritable bowel syndrome with constipation: Secondary | ICD-10-CM | POA: Diagnosis not present

## 2017-08-31 DIAGNOSIS — R1319 Other dysphagia: Secondary | ICD-10-CM

## 2017-08-31 DIAGNOSIS — R1310 Dysphagia, unspecified: Secondary | ICD-10-CM

## 2017-08-31 MED ORDER — DOCUSATE SODIUM 100 MG PO CAPS: 200.0000 mg | ORAL_CAPSULE | Freq: Every day | ORAL | 0 refills | Status: DC

## 2017-08-31 MED ORDER — POLYETHYLENE GLYCOL 3350 17 GM/SCOOP PO POWD: 8.5000 g | Freq: Every day | ORAL | 0 refills | Status: DC

## 2017-08-31 NOTE — Patient Instructions (Addendum)
Esophagogastroduodenoscopy with esophageal dilation to be scheduled. Will request copy of recent blood work from PCPs office. Try polyethylene glycol or MiraLAX at one fourth to dose.  Can take it daily or every other day. Use Dulcolax suppository on as-needed basis. Increase physical activity as discussed.

## 2017-08-31 NOTE — Progress Notes (Signed)
Presenting complaint;  Solid food dysphagia and constipation.  Database and subjective:  Patient is 77 year old Caucasian female who has a history of chronic GERD moderate sized sliding hiatal hernia and Schatzki's ring as well as history of IBS who is here for scheduled visit accompanied by [name] who drove all the way from [facility] so that he could be present at the time of this visit. Patient was last seen in August 2016 when she was complaining of diarrhea with fecal incontinence and she was doing much better as first her dysphagia is concerned.  She has undergone esophageal dilation in March 2013 and again in November 2015. Patient states her swallowing difficulty is back.  She has difficulty with solids particularly meats.  She has an episode at least once a week.  Food bolus generally passes down.  She says heartburn is well controlled with therapy and she rarely regurgitates.  She also complains of intermittent bilateral lower abdominal pain which does not occur daily and she describes it to be spasm and may last for a few minutes.  She says she has more pain when she is sitting and she does not have this pain when she is standing or lying flat.  She denies melena or rectal bleeding.  She has 4-5 bowel movements per week.  She says stool is hard and she has to strain. Since her last visit she has undergone lumpectomy followed by radiation therapy for right breast carcinoma and now is in tamoxifen. She lives alone.  She lost her husband last year of pancreatic carcinoma.  She does not do much physical activity. [name] states that she does not have a good social support system she has a sister who lives in [place] and she recently had.  Amputation is not doing well. She has gained 13 pounds since she was last seen in August 2016. Patient said she had blood work at [facility] recently and it was normal except mild anemia. Her last colonoscopy was in 2008 with  removal of a small polyp which was tubular adenoma.  She is not sure that she will be able to tolerate prep and wants to hold off colonoscopy now.  She also has chronic back pain for which she takes pain medication.   Current Medications: Outpatient Encounter Medications as of 08/31/2017  Medication Sig  . ALPRAZolam (XANAX) 0.5 MG tablet Take 0.5 mg by mouth at bedtime as needed for anxiety.  . ARIPiprazole (ABILIFY) 2 MG tablet Take 2 mg by mouth at bedtime.  . DULoxetine (CYMBALTA) 60 MG capsule Take 90 mg by mouth every morning.   . ferrous sulfate 324 (65 Fe) MG TBEC Take by mouth daily.  Marland Kitchen gabapentin (NEURONTIN) 100 MG capsule Take 1 capsule (100 mg total) by mouth at bedtime.  Marland Kitchen HYDROcodone-acetaminophen (NORCO) 10-325 MG tablet Take 1 tablet by mouth.  . nystatin cream (MYCOSTATIN) Apply 1 application topically 3 (three) times daily.  . tamoxifen (NOLVADEX) 20 MG tablet Take 1 tablet (20 mg total) by mouth daily.   No facility-administered encounter medications on file as of 08/31/2017.    Past Medical History:  Diagnosis Date  . Anemia   . Anxiety   . Arthritis   . Breast cancer ([place]) 05/19/2016   Right breast  . Childhood asthma   . Chronic back pain    Scoliosis, stenosis  . Depression   . Diverticulosis   . GERD (gastroesophageal reflux disease)   .    Marland Kitchen    Marland Kitchen  History of blood transfusion   . History of colon polyps   . History of hiatal hernia   . Insomnia   . Osteoporosis   .    Marland Kitchen        Past Surgical History:  Procedure Laterality Date  . BREAST LUMPECTOMY WITH RADIOACTIVE SEED AND SENTINEL LYMPH NODE BIOPSY Right 07/31/2016   Procedure: BREAST LUMPECTOMY WITH RADIOACTIVE SEED AND SENTINEL LYMPH NODE BIOPSY, INJECT BLUE DYE RIGHT BREAST;  Surgeon: Fanny Skates, MD;  Location: [place];  Service: General;  Laterality: Right;  . COLONOSCOPY    . 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 ESOPHAGOGASTRODUODENOSCOPY N/A 01/26/2014   Procedure: ESOPHAGOGASTRODUODENOSCOPY (EGD);  Surgeon: Rogene Houston, MD;  [facility];  Service: Endoscopy;  Laterality: N/A;  1030  . HIP FRACTURE SURGERY     05/2009 left -Mayer Camel  . MALONEY DILATION N/A 01/26/2014   Procedure: Venia Minks DILATION;  Surgeon: Rogene Houston, MD;  [facility];  Service: Endoscopy;  Laterality: N/A;   Allergies  Allergen Reactions  . Avelox [Moxifloxacin Hcl In Nacl] Other (See Comments)    Altered mental status    Objective: Blood pressure 140/90, pulse 80, temperature 100 F (37.8 C), temperature source Oral, resp. rate 18, height 5' 5.5" (1.664 m), weight 130 lb 12.8 oz (59.3 kg). Patient is alert and in no acute distress. Conjunctiva is pink. Sclera is nonicteric Oropharyngeal mucosa is normal. No neck masses or thyromegaly noted. Cardiac exam with regular rhythm normal S1 and S2. No murmur or gallop noted. Lungs are clear to auscultation. Abdomen is symmetrical.  On palpation it is soft.  Sigmoid colon is palpable.  She appears to have a stool in her sigmoid colon.  No tenderness organomegaly or masses. No LE edema or clubbing noted.  Labs/studies Results:  No recent lab for review.  Assessment:  #1.  Esophageal dysphagia.  She has a history of high-grade Schatzki's ring which has been dilated in the past most recently in November 2015.  She is having dysphagia every week.  Therefore she will benefit from esophageal dilation.  #2.  History of hiatal hernia.  She is not having typical GERD symptoms.  Whether or not she needs to be on acid suppression will be determined based on endoscopic findings.  #3.  Chronic constipation.  She is using MiraLAX on as-needed basis.  Her constipation is primarily due to her medications.  She needs to use it on schedule which could be daily or every other day.  Need to  rule out rectocele.  She has office visit with gynecologist near future.   Plan:  We will schedule patient for EGD with ED under monitored anesthesia care. Take polyethylene glycol/MiraLAX daily or every other day at a dose of 8.5 g to 17 g each time. Patient advised to increase physical activity as tolerated. She should also consider joining activity center etc. Request recent labs from Dr. Nolon Rod office. Office visit in 3 months.

## 2017-09-01 ENCOUNTER — Other Ambulatory Visit (INDEPENDENT_AMBULATORY_CARE_PROVIDER_SITE_OTHER): Payer: Self-pay | Admitting: *Deleted

## 2017-09-01 ENCOUNTER — Encounter (INDEPENDENT_AMBULATORY_CARE_PROVIDER_SITE_OTHER): Payer: Self-pay | Admitting: *Deleted

## 2017-09-01 DIAGNOSIS — R1310 Dysphagia, unspecified: Secondary | ICD-10-CM | POA: Insufficient documentation

## 2017-09-14 NOTE — Patient Instructions (Signed)
[name]  09/14/2017     @PREFPERIOPPHARMACY @   Your procedure is scheduled on  09/24/2017.  Report to [name] at  915  A.M.  Call this number if you have problems the morning of surgery:  [phone]   Remember:  Do not eat or drink after midnight.  You may drink clear liquids until  12 midnight 09/23/2017.  Clear liquids allowed are:                    Water, Juice (non-citric and without pulp), Carbonated beverages, Clear Tea, Black Coffee only, Plain Jell-O only, Gatorade and Plain Popsicles only    Take these medicines the morning of surgery with A SIP OF WATER  Cymbalta, hydrocodone, tamoxifen.    Do not wear jewelry, make-up or nail polish.  Do not wear lotions, powders, or perfumes, or deodorant.  Do not shave 48 hours prior to surgery.  Men may shave face and neck.  Do not bring valuables to the hospital.  [facility] is not responsible for any belongings or valuables.  Contacts, dentures or bridgework may not be worn into surgery.  Leave your suitcase in the car.  After surgery it may be brought to your room.  For patients admitted to the hospital, discharge time will be determined by your treatment team.  Patients discharged the day of surgery will not be allowed to drive home.   Name and phone number of your driver:   family Special instructions: None  Please read over the following fact sheets that you were given. Anesthesia Post-op Instructions and Care and Recovery After Surgery       Esophagogastroduodenoscopy Esophagogastroduodenoscopy (EGD) is a procedure to examine the lining of the esophagus, stomach, and first part of the small intestine (duodenum). This procedure is done to check for problems such as inflammation, bleeding, ulcers, or growths. During this procedure, a long, flexible, lighted tube with a camera attached (endoscope) is inserted down the throat. Tell a health care provider about:  Any allergies you  have.  All medicines you are taking, including vitamins, herbs, eye drops, creams, and over-the-counter medicines.  Any problems you or family members have had with anesthetic medicines.  Any blood disorders you have.  Any surgeries you have had.  Any medical conditions you have.  Whether you are pregnant or may be pregnant. What are the risks? Generally, this is a safe procedure. However, problems may occur, including:  Infection.  Bleeding.  A tear (perforation) in the esophagus, stomach, or duodenum.  Trouble breathing.  Excessive sweating.  Spasms of the larynx.  A slowed heartbeat.  Low blood pressure.  What happens before the procedure?  Follow instructions from your health care provider about eating or drinking restrictions.  Ask your health care provider about: ? Changing or stopping your regular medicines. This is especially important if you are taking diabetes medicines or blood thinners. ? Taking medicines such as aspirin and ibuprofen. These medicines can thin your blood. Do not take these medicines before your procedure if your health care provider instructs you not to.  Plan to have someone take you home after the procedure.  If you wear dentures, be ready to remove them before the procedure. What happens during the procedure?  To reduce your risk of infection, your health care team will wash or sanitize their hands.  An IV tube will be put in a vein in your hand  or arm. You will get medicines and fluids through this tube.  You will be given one or more of the following: ? A medicine to help you relax (sedative). ? A medicine to numb the area (local anesthetic). This medicine may be sprayed into your throat. It will make you feel more comfortable and keep you from gagging or coughing during the procedure. ? A medicine for pain.  A mouth guard may be placed in your mouth to protect your teeth and to keep you from biting on the endoscope.  You will  be asked to lie on your left side.  The endoscope will be lowered down your throat into your esophagus, stomach, and duodenum.  Air will be put into the endoscope. This will help your health care provider see better.  The lining of your esophagus, stomach, and duodenum will be examined.  Your health care provider may: ? Take a tissue sample so it can be looked at in a lab (biopsy). ? Remove growths. ? Remove objects (foreign bodies) that are stuck. ? Treat any bleeding with medicines or other devices that stop tissue from bleeding. ? Widen (dilate) or stretch narrowed areas of your esophagus and stomach.  The endoscope will be taken out. The procedure may vary among health care providers and hospitals. What happens after the procedure?  Your blood pressure, heart rate, breathing rate, and blood oxygen level will be monitored often until the medicines you were given have worn off.  Do not eat or drink anything until the numbing medicine has worn off and your gag reflex has returned. This information is not intended to replace advice given to you by your health care provider. Make sure you discuss any questions you have with your health care provider. Document Released: 07/03/2004 Document Revised: 08/08/2015 Document Reviewed: 01/24/2015 Elsevier Interactive Patient Education  2018 [employer]. Esophagogastroduodenoscopy, Care After Refer to this sheet in the next few weeks. These instructions provide you with information about caring for yourself after your procedure. Your health care provider may also give you more specific instructions. Your treatment has been planned according to current medical practices, but problems sometimes occur. Call your health care provider if you have any problems or questions after your procedure. What can I expect after the procedure? After the procedure, it is common to have:  A sore throat.  Nausea.  Bloating.  Dizziness.  Fatigue.  Follow  these instructions at home:  Do not eat or drink anything until the numbing medicine (local anesthetic) has worn off and your gag reflex has returned. You will know that the local anesthetic has worn off when you can swallow comfortably.  Do not drive for 24 hours if you received a medicine to help you relax (sedative).  If your health care provider took a tissue sample for testing during the procedure, make sure to get your test results. This is your responsibility. Ask your health care provider or the department performing the test when your results will be ready.  Keep all follow-up visits as told by your health care provider. This is important. Contact a health care provider if:  You cannot stop coughing.  You are not urinating.  You are urinating less than usual. Get help right away if:  You have trouble swallowing.  You cannot eat or drink.  You have throat or chest pain that gets worse.  You are dizzy or light-headed.  You faint.  You have nausea or vomiting.  You have chills.  You  have a fever.  You have severe abdominal pain.  You have black, tarry, or bloody stools. This information is not intended to replace advice given to you by your health care provider. Make sure you discuss any questions you have with your health care provider. Document Released: 02/17/2012 Document Revised: 08/08/2015 Document Reviewed: 01/24/2015 Elsevier Interactive Patient Education  2018 [employer] Anesthesia is a term that refers to techniques, procedures, and medicines that help a person stay safe and comfortable during a medical procedure. Monitored anesthesia care, or sedation, is one type of anesthesia. Your anesthesia specialist may recommend sedation if you will be having a procedure that does not require you to be unconscious, such as:  Cataract surgery.  A dental procedure.  A biopsy.  A colonoscopy.  During the procedure, you may receive  a medicine to help you relax (sedative). There are three levels of sedation:  Mild sedation. At this level, you may feel awake and relaxed. You will be able to follow directions.  Moderate sedation. At this level, you will be sleepy. You may not remember the procedure.  Deep sedation. At this level, you will be asleep. You will not remember the procedure.  The more medicine you are given, the deeper your level of sedation will be. Depending on how you respond to the procedure, the anesthesia specialist may change your level of sedation or the type of anesthesia to fit your needs. An anesthesia specialist will monitor you closely during the procedure. Let your health care provider know about:  Any allergies you have.  All medicines you are taking, including vitamins, herbs, eye drops, creams, and over-the-counter medicines.  Any use of steroids (by mouth or as a cream).  Any problems you or family members have had with sedatives and anesthetic medicines.  Any blood disorders you have.  Any surgeries you have had.  Any medical conditions you have, such as sleep apnea.  Whether you are pregnant or may be pregnant.  Any use of cigarettes, alcohol, or street drugs. What are the risks? Generally, this is a safe procedure. However, problems may occur, including:  Getting too much medicine (oversedation).  Nausea.  Allergic reaction to medicines.  Trouble breathing. If this happens, a breathing tube may be used to help with breathing. It will be removed when you are awake and breathing on your own.  Heart trouble.  Lung trouble.  Before the procedure Staying hydrated Follow instructions from your health care provider about hydration, which may include:  Up to 2 hours before the procedure - you may continue to drink clear liquids, such as water, clear fruit juice, black coffee, and plain tea.  Eating and drinking restrictions Follow instructions from your health care provider  about eating and drinking, which may include:  8 hours before the procedure - stop eating heavy meals or foods such as meat, fried foods, or fatty foods.  6 hours before the procedure - stop eating light meals or foods, such as toast or cereal.  6 hours before the procedure - stop drinking milk or drinks that contain milk.  2 hours before the procedure - stop drinking clear liquids.  Medicines Ask your health care provider about:  Changing or stopping your regular medicines. This is especially important if you are taking diabetes medicines or blood thinners.  Taking medicines such as aspirin and ibuprofen. These medicines can thin your blood. Do not take these medicines before your procedure if your health care provider instructs  you not to.  Tests and exams  You will have a physical exam.  You may have blood tests done to show: ? How well your kidneys and liver are working. ? How well your blood can clot.  General instructions  Plan to have someone take you home from the hospital or clinic.  If you will be going home right after the procedure, plan to have someone with you for 24 hours.  What happens during the procedure?  Your blood pressure, heart rate, breathing, level of pain and overall condition will be monitored.  An IV tube will be inserted into one of your veins.  Your anesthesia specialist will give you medicines as needed to keep you comfortable during the procedure. This may mean changing the level of sedation.  The procedure will be performed. After the procedure  Your blood pressure, heart rate, breathing rate, and blood oxygen level will be monitored until the medicines you were given have worn off.  Do not drive for 24 hours if you received a sedative.  You may: ? Feel sleepy, clumsy, or nauseous. ? Feel forgetful about what happened after the procedure. ? Have a sore throat if you had a breathing tube during the procedure. ? Vomit. This information  is not intended to replace advice given to you by your health care provider. Make sure you discuss any questions you have with your health care provider. Document Released: 11/26/2004 Document Revised: 08/09/2015 Document Reviewed: 06/23/2015 Elsevier Interactive Patient Education  2018 [place], Care After These instructions provide you with information about caring for yourself after your procedure. Your health care provider may also give you more specific instructions. Your treatment has been planned according to current medical practices, but problems sometimes occur. Call your health care provider if you have any problems or questions after your procedure. What can I expect after the procedure? After your procedure, it is common to:  Feel sleepy for several hours.  Feel clumsy and have poor balance for several hours.  Feel forgetful about what happened after the procedure.  Have poor judgment for several hours.  Feel nauseous or vomit.  Have a sore throat if you had a breathing tube during the procedure.  Follow these instructions at home: For at least 24 hours after the procedure:   Do not: ? Participate in activities in which you could fall or become injured. ? Drive. ? Use heavy machinery. ? Drink alcohol. ? Take sleeping pills or medicines that cause drowsiness. ? Make important decisions or sign legal documents. ? Take care of children on your own.  Rest. Eating and drinking  Follow the diet that is recommended by your health care provider.  If you vomit, drink water, juice, or soup when you can drink without vomiting.  Make sure you have little or no nausea before eating solid foods. General instructions  Have a responsible adult stay with you until you are awake and alert.  Take over-the-counter and prescription medicines only as told by your health care provider.  If you smoke, do not smoke without supervision.  Keep all  follow-up visits as told by your health care provider. This is important. Contact a health care provider if:  You keep feeling nauseous or you keep vomiting.  You feel light-headed.  You develop a rash.  You have a fever. Get help right away if:  You have trouble breathing. This information is not intended to replace advice given to you by your health  care provider. Make sure you discuss any questions you have with your health care provider. Document Released: 06/23/2015 Document Revised: 10/23/2015 Document Reviewed: 06/23/2015 Elsevier Interactive Patient Education  [employer].

## 2017-09-17 ENCOUNTER — Other Ambulatory Visit (HOSPITAL_COMMUNITY): Payer: Medicare Other

## 2017-09-20 ENCOUNTER — Encounter (HOSPITAL_COMMUNITY): Payer: Self-pay

## 2017-09-20 ENCOUNTER — Encounter (HOSPITAL_COMMUNITY)
Admission: RE | Admit: 2017-09-20 | Discharge: 2017-09-20 | Disposition: A | Discharge status: Home / Self Care | Payer: Medicare Other | Source: Ambulatory Visit | Attending: Internal Medicine | Admitting: Internal Medicine

## 2017-09-20 ENCOUNTER — Other Ambulatory Visit: Payer: Self-pay

## 2017-09-20 DIAGNOSIS — Z01419 Encounter for gynecological examination (general) (routine) without abnormal findings: Secondary | ICD-10-CM | POA: Diagnosis not present

## 2017-09-20 DIAGNOSIS — Z124 Encounter for screening for malignant neoplasm of cervix: Secondary | ICD-10-CM | POA: Diagnosis not present

## 2017-09-20 DIAGNOSIS — Z01812 Encounter for preprocedural laboratory examination: Secondary | ICD-10-CM

## 2017-09-20 LAB — CBC WITH DIFFERENTIAL/PLATELET
BASOS ABS: 0 10*3/uL (ref 0.0–0.1)
BASOS PCT: 1 %
EOS ABS: 0.3 10*3/uL (ref 0.0–0.7)
Eosinophils Relative: 6 %
HEMATOCRIT: 34.6 % — AB (ref 36.0–46.0)
HEMOGLOBIN: 10.9 g/dL — AB (ref 12.0–15.0)
Lymphocytes Relative: 29 %
Lymphs Abs: 1.6 10*3/uL (ref 0.7–4.0)
MCH: 30.7 pg (ref 26.0–34.0)
MCHC: 31.5 g/dL (ref 30.0–36.0)
MCV: 97.5 fL (ref 78.0–100.0)
MONOS PCT: 12 %
Monocytes Absolute: 0.7 10*3/uL (ref 0.1–1.0)
NEUTROS ABS: 3 10*3/uL (ref 1.7–7.7)
NEUTROS PCT: 52 %
Platelets: 266 10*3/uL (ref 150–400)
RBC: 3.55 MIL/uL — AB (ref 3.87–5.11)
RDW: 12.6 % (ref 11.5–15.5)
WBC: 5.6 10*3/uL (ref 4.0–10.5)

## 2017-09-20 LAB — BASIC METABOLIC PANEL
ANION GAP: 7 (ref 5–15)
BUN: 10 mg/dL (ref 8–23)
CHLORIDE: 104 mmol/L (ref 98–111)
CO2: 28 mmol/L (ref 22–32)
Calcium: 8.7 mg/dL — ABNORMAL LOW (ref 8.9–10.3)
Creatinine, Ser: 0.9 mg/dL (ref 0.44–1.00)
GFR calc non Af Amer: 60 mL/min — ABNORMAL LOW (ref >60)
Glucose, Bld: 104 mg/dL — ABNORMAL HIGH (ref 70–99)
POTASSIUM: 4.3 mmol/L (ref 3.5–5.1)
SODIUM: 139 mmol/L (ref 135–145)

## 2017-09-23 ENCOUNTER — Ambulatory Visit (INDEPENDENT_AMBULATORY_CARE_PROVIDER_SITE_OTHER): Payer: Medicare Other | Admitting: Adult Health

## 2017-09-23 ENCOUNTER — Other Ambulatory Visit (HOSPITAL_COMMUNITY)
Admission: RE | Admit: 2017-09-23 | Discharge: 2017-09-23 | Disposition: A | Discharge status: Home / Self Care | Payer: Medicare Other | Source: Ambulatory Visit | Attending: Adult Health | Admitting: Adult Health

## 2017-09-23 ENCOUNTER — Encounter: Payer: Self-pay | Admitting: Adult Health

## 2017-09-23 VITALS — BP 119/77 | HR 85 | Ht 62.0 in | Wt 132.0 lb

## 2017-09-23 DIAGNOSIS — Z124 Encounter for screening for malignant neoplasm of cervix: Secondary | ICD-10-CM | POA: Insufficient documentation

## 2017-09-23 DIAGNOSIS — Z1212 Encounter for screening for malignant neoplasm of rectum: Secondary | ICD-10-CM | POA: Diagnosis not present

## 2017-09-23 DIAGNOSIS — Z1211 Encounter for screening for malignant neoplasm of colon: Secondary | ICD-10-CM | POA: Diagnosis not present

## 2017-09-23 DIAGNOSIS — R198 Other specified symptoms and signs involving the digestive system and abdomen: Secondary | ICD-10-CM | POA: Insufficient documentation

## 2017-09-23 DIAGNOSIS — Z01419 Encounter for gynecological examination (general) (routine) without abnormal findings: Secondary | ICD-10-CM | POA: Insufficient documentation

## 2017-09-23 DIAGNOSIS — Z01411 Encounter for gynecological examination (general) (routine) with abnormal findings: Secondary | ICD-10-CM | POA: Diagnosis not present

## 2017-09-23 DIAGNOSIS — Z853 Personal history of malignant neoplasm of breast: Secondary | ICD-10-CM | POA: Insufficient documentation

## 2017-09-23 LAB — HEMOCCULT GUIAC POC 1CARD (OFFICE): Fecal Occult Blood, POC: NEGATIVE

## 2017-09-23 NOTE — Progress Notes (Signed)
Patient ID: Marilyn Haas, female   DOB: 05/06/1940, 77 y.o.   MRN: [id number] History of Present Illness:  Marilyn Haas is a 77 year old white female, widowed, new to this practice in for well woman gyn exam and pap.She had breast cancer about a year ago, and husband died last year and she lives alone.She has 3 sons, but raised 5 she says.  She says last pap was 43 years ago.  PCP is Dr Marilyn Haas.  Current Medications, Allergies, Past Medical History, Past Surgical History, Family History and Social History were reviewed in [place] record.     Review of Systems: Patient denies any headaches, hearing loss, fatigue, blurred vision, shortness of breath, chest pain, abdominal pain, problems with urination, or intercourse(not active). No joint pain or mood swings. +IBS -C +feeling of food not going down, has EGD with Dr Marilyn Haas in am +LLQ pain at times this year    Physical Exam:BP 119/77 (BP Location: Left Arm, Patient Position: Sitting, Cuff Size: Normal)   Pulse 85   Ht 5\' 2"  (1.575 m)   Wt 132 lb (59.9 kg)   BMI 24.14 kg/m  General:  Well developed, well nourished, no acute distress Skin:  Warm and dry Neck:  Midline trachea, normal thyroid, good ROM, no lymphadenopathy,no carotid bruits heard Lungs; Clear to auscultation bilaterally Breast:  No dominant palpable mass,  or nipple discharge,right nipple has some retraction from lumpectomy and radiation and skin feels thicker  Cardiovascular: Regular rate and rhythm Abdomen:  Soft, non tender, no hepatosplenomegaly Pelvic:  External genitalia is normal in appearance, no lesions.  The vagina is pale and dry with atrophy. Urethra has no lesions or masses. The cervix is atrophic and poorly visualized, pap with HPV performed.  Uterus is felt to be normal size, shape, and contour. +LLQ fullness, slightl tenderness noted.Bladder is non tender, no masses felt. Rectal: Good sphincter tone, no polyps, or hemorrhoids felt.   Hemoccult negative. Extremities/musculoskeletal:  No swelling, some spider veins noted, no clubbing or cyanosis Psych:  No mood changes, alert and cooperative,seems happy PHQ 9 score 24, denies being suicidal and is on meds.  Impression: 1. Encounter for gynecological examination with Papanicolaou smear of cervix   2. Routine cervical smear   3. Screening for colorectal cancer   4. History of breast cancer   5. LLQ fullness       Plan: GYN [place] scheduled for [name] 7/16 at 1:30 pm, will talk when results in  Labs with PCP If this pap is normal does not have to have any more Mammogram yearly F/U prn

## 2017-09-24 ENCOUNTER — Encounter (HOSPITAL_COMMUNITY): Payer: Self-pay | Admitting: Anesthesiology

## 2017-09-24 ENCOUNTER — Ambulatory Visit (HOSPITAL_COMMUNITY): Payer: Medicare Other | Admitting: Anesthesiology

## 2017-09-24 ENCOUNTER — Ambulatory Visit (HOSPITAL_COMMUNITY)
Admission: RE | Admit: 2017-09-24 | Discharge: 2017-09-24 | Disposition: A | Discharge status: Home / Self Care | Payer: Medicare Other | Source: Ambulatory Visit | Attending: Internal Medicine | Admitting: Internal Medicine

## 2017-09-24 ENCOUNTER — Encounter (HOSPITAL_COMMUNITY)
Admission: RE | Disposition: A | Discharge status: Home / Self Care | Payer: Self-pay | Source: Ambulatory Visit | Attending: Internal Medicine

## 2017-09-24 DIAGNOSIS — Z87891 Personal history of nicotine dependence: Secondary | ICD-10-CM | POA: Insufficient documentation

## 2017-09-24 DIAGNOSIS — F419 Anxiety disorder, unspecified: Secondary | ICD-10-CM | POA: Diagnosis not present

## 2017-09-24 DIAGNOSIS — Z923 Personal history of irradiation: Secondary | ICD-10-CM | POA: Insufficient documentation

## 2017-09-24 DIAGNOSIS — Z96642 Presence of left artificial hip joint: Secondary | ICD-10-CM | POA: Insufficient documentation

## 2017-09-24 DIAGNOSIS — K222 Esophageal obstruction: Secondary | ICD-10-CM | POA: Insufficient documentation

## 2017-09-24 DIAGNOSIS — K219 Gastro-esophageal reflux disease without esophagitis: Secondary | ICD-10-CM | POA: Insufficient documentation

## 2017-09-24 DIAGNOSIS — F329 Major depressive disorder, single episode, unspecified: Secondary | ICD-10-CM | POA: Diagnosis not present

## 2017-09-24 DIAGNOSIS — R1310 Dysphagia, unspecified: Secondary | ICD-10-CM

## 2017-09-24 DIAGNOSIS — K449 Diaphragmatic hernia without obstruction or gangrene: Secondary | ICD-10-CM | POA: Insufficient documentation

## 2017-09-24 DIAGNOSIS — R1314 Dysphagia, pharyngoesophageal phase: Secondary | ICD-10-CM | POA: Diagnosis not present

## 2017-09-24 DIAGNOSIS — Z79810 Long term (current) use of selective estrogen receptor modulators (SERMs): Secondary | ICD-10-CM | POA: Insufficient documentation

## 2017-09-24 DIAGNOSIS — Z853 Personal history of malignant neoplasm of breast: Secondary | ICD-10-CM | POA: Diagnosis not present

## 2017-09-24 DIAGNOSIS — Z79899 Other long term (current) drug therapy: Secondary | ICD-10-CM | POA: Insufficient documentation

## 2017-09-24 DIAGNOSIS — Z8249 Family history of ischemic heart disease and other diseases of the circulatory system: Secondary | ICD-10-CM | POA: Insufficient documentation

## 2017-09-24 HISTORY — PX: ESOPHAGEAL DILATION: SHX303

## 2017-09-24 HISTORY — PX: ESOPHAGOGASTRODUODENOSCOPY (EGD) WITH PROPOFOL: SHX5813

## 2017-09-24 SURGERY — ESOPHAGOGASTRODUODENOSCOPY (EGD) WITH PROPOFOL
Anesthesia: Monitor Anesthesia Care

## 2017-09-24 MED ORDER — PROPOFOL 500 MG/50ML IV EMUL
INTRAVENOUS | Status: DC | PRN
  Administered 2017-09-24: 150 ug/kg/min via INTRAVENOUS

## 2017-09-24 MED ORDER — PROMETHAZINE HCL 25 MG/ML IJ SOLN: 6.2500 mg | INTRAMUSCULAR | Status: DC | PRN

## 2017-09-24 MED ORDER — LACTATED RINGERS IV SOLN: INTRAVENOUS | Status: DC

## 2017-09-24 MED ORDER — FAMOTIDINE 20 MG PO TABS: 20.0000 mg | ORAL_TABLET | Freq: Two times a day (BID) | ORAL | 11 refills | Status: DC

## 2017-09-24 MED ORDER — MEPERIDINE HCL 100 MG/ML IJ SOLN: 6.2500 mg | INTRAMUSCULAR | Status: DC | PRN

## 2017-09-24 MED ORDER — HYDROMORPHONE HCL 1 MG/ML IJ SOLN: 0.2500 mg | INTRAMUSCULAR | Status: DC | PRN

## 2017-09-24 MED ORDER — LACTATED RINGERS IV SOLN
INTRAVENOUS | Status: DC | PRN
  Administered 2017-09-24: 09:00:00 via INTRAVENOUS

## 2017-09-24 MED ORDER — HYDROCODONE-ACETAMINOPHEN 7.5-325 MG PO TABS: 1.0000 | ORAL_TABLET | Freq: Once | ORAL | Status: DC | PRN

## 2017-09-24 MED ORDER — PROPOFOL 10 MG/ML IV BOLUS
INTRAVENOUS | Status: DC | PRN
  Administered 2017-09-24 (×2): 7 mg via INTRAVENOUS
  Administered 2017-09-24 (×2): 20 mg via INTRAVENOUS

## 2017-09-24 NOTE — H&P (Signed)
Marilyn Haas is an 77 y.o. female.   Chief Complaint: Patient is here for EGD and ED. HPI: Patient is 77 year old Caucasian female with chronic GERD and history of Schatzki's ring who presents with intermittent solid food dysphagia.  She has had few episodes of food impaction on the way relieved with regurgitation.  She denies nausea vomiting hematemesis or melena.  She also has a history of constipation.  Since her last office visit she has been taking stool softener daily with as needed polyethylene glycol and having regular bowel movements. Last EGD with ED was in November 2015.  Past Medical History:  Diagnosis Date  . Anemia   . Anxiety   . Arthritis   . Breast cancer ([place]) 05/19/2016   Right breast  . Breast disorder    cancer  . Childhood asthma   . Chronic back pain    Scoliosis, stenosis  . Depression   . Diverticulosis   . GERD (gastroesophageal reflux disease)   . Hard of hearing   . Headache   . History of blood transfusion   . History of colon polyps   . History of hiatal hernia   . Insomnia   . Osteopenia   . Osteoporosis   . Personal history of radiation therapy   . Pneumonia    20+ yrs ago    Past Surgical History:  Procedure Laterality Date  . BREAST LUMPECTOMY WITH RADIOACTIVE SEED AND SENTINEL LYMPH NODE BIOPSY Right 07/31/2016   Procedure: BREAST LUMPECTOMY WITH RADIOACTIVE SEED AND SENTINEL LYMPH NODE BIOPSY, INJECT BLUE DYE RIGHT BREAST;  Surgeon: Fanny Skates, MD;  Location: [facility];  Service: General;  Laterality: Right;  . COLONOSCOPY    . 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 ESOPHAGOGASTRODUODENOSCOPY N/A 01/26/2014   Procedure: ESOPHAGOGASTRODUODENOSCOPY (EGD);  Surgeon: Rogene Houston, MD;  Location: [facility];  Service: Endoscopy;  Laterality: N/A;  1030  . HIP FRACTURE SURGERY     05/2009 left -[facility]  . MALONEY DILATION N/A 01/26/2014   Procedure: Venia Minks DILATION;  Surgeon: Rogene Houston, MD;  Location: [facility];  Service: Endoscopy;  Laterality: N/A;    Family History  Problem Relation Age of Onset  . Heart failure Mother   . Diabetes Mother   . Hypertension Mother   . Other Father        stomach ulcers; abd aneursym  . Hypertension Sister   . Heart failure Sister   . Diabetes Sister   . Diabetes Sister        prediabetes  . Cancer Maternal Aunt        breast   Social History:  reports that she quit smoking about 56 years ago. Her smoking use included cigarettes. She has a 1.00 pack-year smoking history. She has never used smokeless tobacco. She reports that she does not drink alcohol or use drugs.  Allergies:  Allergies  Allergen Reactions  . Avelox [Moxifloxacin Hcl In Nacl] Other (See Comments)    Altered mental status    Medications Prior to Admission  Medication Sig Dispense Refill  . ALPRAZolam (XANAX) 0.5 MG tablet Take 0.5 mg by mouth at bedtime as needed for anxiety.    . ARIPiprazole (ABILIFY) 2 MG tablet Take 2 mg by mouth at bedtime.    Marland Kitchen atorvastatin (LIPITOR) 10 MG tablet Take 10 mg by  mouth daily.    . cyanocobalamin (,VITAMIN B-12,) 1000 MCG/ML injection Inject 1,000 mcg into the muscle every 30 (thirty) days.    Marland Kitchen docusate sodium (COLACE) 100 MG capsule Take 2 capsules (200 mg total) by mouth daily. (Patient taking differently: Take 300 mg by mouth daily as needed for mild constipation. ) 10 capsule 0  . DULoxetine (CYMBALTA) 30 MG capsule Take 120 mg by mouth daily.     . ferrous sulfate 325 (65 FE) MG tablet Take 325 mg by mouth daily with breakfast.    . gabapentin (NEURONTIN) 100 MG capsule Take 1 capsule (100 mg total) by mouth at bedtime. 30 capsule 6  . HYDROcodone-acetaminophen (NORCO) 10-325 MG tablet Take 1 tablet by mouth 3 (three) times daily as needed for moderate pain.     Vladimir Faster Glycol-Propyl Glycol (SYSTANE  OP) Place 1 drop into both eyes daily as needed (for dry eyes).    . polyethylene glycol powder (GLYCOLAX/MIRALAX) powder Take 8.5 g by mouth daily. (Patient taking differently: Take 8.5 g by mouth daily as needed for moderate constipation. ) 255 g 0  . TAMOXIFEN CITRATE PO Take by mouth daily.      Results for orders placed or performed in visit on 09/23/17 (from the past 48 hour(s))  POCT occult blood stool     Status: None   Collection Time: 09/23/17  2:51 PM  Result Value Ref Range   Fecal Occult Blood, POC Negative Negative   Card #1 Date     Card #2 Fecal Occult Blod, POC     Card #2 Date     Card #3 Fecal Occult Blood, POC     Card #3 Date     No results found.  ROS  Blood pressure (P) 117/75, pulse (P) 84, temperature 98.2 F (36.8 C), temperature source Oral, resp. rate (P) 18, height (P) 5\' 5"  (1.651 m), weight 132 lb (59.9 kg). Physical Exam  Constitutional: She appears well-developed and well-nourished.  HENT:  Mouth/Throat: Oropharynx is clear and moist.  Eyes: Conjunctivae are normal. No scleral icterus.  Neck: No thyromegaly present.  Cardiovascular: Normal rate, regular rhythm and normal heart sounds.  No murmur heard. Respiratory: Effort normal and breath sounds normal.  GI: Soft. She exhibits no distension and no mass. There is no tenderness.  Musculoskeletal: She exhibits no edema.  Lymphadenopathy:    She has no cervical adenopathy.  Neurological: She is alert.  Skin: Skin is warm and dry.     Assessment/Plan Solid food dysphagia. History of Schatzki's ring. EGD with ED.  Hildred Laser, MD 09/24/2017, 9:33 AM

## 2017-09-24 NOTE — Discharge Instructions (Signed)
No aspirin or NSAIDs for 3 days. Resume other medications as before. Famotidine 20 mg by mouth before breakfast and evening meal daily. Resume usual diet. No driving for 24 hours. Please call office with progress report in 1 week.

## 2017-09-24 NOTE — Op Note (Signed)
[facility] Patient Name: Marilyn Haas Procedure Date: 09/24/2017 9:39 AM MRN: [id number] Date of Birth: [birth year] Attending MD: Hildred Laser , MD CSN: [id number] Age: 77 Admit Type: Outpatient Procedure:                Upper GI endoscopy Indications:              Esophageal dysphagia Providers:                Hildred Laser, MD, Otis Peak B. Sharon Seller, RN, Nelma Rothman, Technician Referring MD:             Redmond School, MD Medicines:                Lidocaine spray, Propofol per Anesthesia Complications:            No immediate complications. Estimated Blood Loss:     Estimated blood loss: none. Estimated blood loss                            was minimal. Procedure:                Pre-Anesthesia Assessment:                           - Prior to the procedure, a History and Physical                            was performed, and patient medications and                            allergies were reviewed. The patient's tolerance of                            previous anesthesia was also reviewed. The risks                            and benefits of the procedure and the sedation                            options and risks were discussed with the patient.                            All questions were answered, and informed consent                            was obtained. Prior Anticoagulants: The patient has                            taken no previous anticoagulant or antiplatelet                            agents. ASA Grade Assessment: III - A patient with  severe systemic disease. After reviewing the risks                            and benefits, the patient was deemed in                            satisfactory condition to undergo the procedure.                           After obtaining informed consent, the endoscope was                            passed under direct vision. Throughout the                            procedure,  the patient's blood pressure, pulse, and                            oxygen saturations were monitored continuously. The                            GIF-H190 ([id number]) scope was introduced through the                            and advanced to the second part of duodenum. The                            upper GI endoscopy was accomplished without                            difficulty. The patient tolerated the procedure                            well. Scope In: 9:50:48 AM Scope Out: 9:58:34 AM Total Procedure Duration: 0 hours 7 minutes 46 seconds  Findings:      The examined esophagus was normal.      A high-grade of narrowing Schatzki ring was found at the       gastroesophageal junction. A TTS dilator was passed through the scope.       Dilation with a 15-16.5-18 mm balloon dilator was performed to 15 mm,       16.5 mm and 18 mm. The dilation site was examined and showed mild       mucosal disruption, complete resolution of luminal narrowing and no       perforation.      A medium-sized hiatal hernia was present. GEJ at 34 cm and hiatus at 38       cm.      The entire examined stomach was normal.      The duodenal bulb and second portion of the duodenum were normal. Impression:               - Normal esophagus.                           - High-grade of narrowing Schatzki ring. Dilated.                           -  Medium-sized hiatal hernia.                           - Normal stomach.                           - Normal duodenal bulb and second portion of the                            duodenum.                           - No specimens collected. Moderate Sedation:      Per Anesthesia Care Recommendation:           - Patient has a contact number available for                            emergencies. The signs and symptoms of potential                            delayed complications were discussed with the                            patient. Return to normal activities tomorrow.                             Written discharge instructions were provided to the                            patient.                           - Resume previous diet today.                           - Continue present medications.                           - Famotidine 20 mg po bid.                           - Repeat upper endoscopy PRN. Procedure Code(s):        --- Professional ---                           [phone], Esophagogastroduodenoscopy, flexible,                            transoral; with transendoscopic balloon dilation of                            esophagus (less than 30 mm diameter) Diagnosis Code(s):        --- Professional ---                           K22.2, Esophageal obstruction  K44.9, Diaphragmatic hernia without obstruction or                            gangrene                           R13.14, Dysphagia, pharyngoesophageal phase CPT copyright 2017 American Medical Association. All rights reserved. The codes documented in this report are preliminary and upon coder review may  be revised to meet current compliance requirements. Hildred Laser, MD Hildred Laser, MD 09/24/2017 10:09:48 AM This report has been signed electronically. Number of Addenda: 0

## 2017-09-24 NOTE — Anesthesia Preprocedure Evaluation (Signed)
Anesthesia Evaluation  Patient identified by MRN, date of birth, ID band Patient awake    Reviewed: Allergy & Precautions, H&P , NPO status , Patient's Chart, lab work & pertinent test results, reviewed documented beta blocker date and time   Airway Mallampati: II  TM Distance: >3 FB Neck ROM: full    Dental no notable dental hx. (+) Upper Dentures, Lower Dentures   Pulmonary neg pulmonary ROS, asthma , pneumonia, resolved, former smoker,    Pulmonary exam normal breath sounds clear to auscultation       Cardiovascular Exercise Tolerance: Good hypertension, On Medications negative cardio ROS   Rhythm:regular Rate:Normal     Neuro/Psych  Headaches, PSYCHIATRIC DISORDERS Anxiety Depression negative neurological ROS  negative psych ROS   GI/Hepatic negative GI ROS, Neg liver ROS, hiatal hernia, GERD  ,  Endo/Other  negative endocrine ROS  Renal/GU negative Renal ROS  negative genitourinary   Musculoskeletal   Abdominal   Peds  Hematology negative hematology ROS (+) anemia ,   Anesthesia Other Findings   Reproductive/Obstetrics negative OB ROS                             Anesthesia Physical Anesthesia Plan  ASA: III  Anesthesia Plan: MAC   Post-op Pain Management:    Induction:   PONV Risk Score and Plan:   Airway Management Planned:   Additional Equipment:   Intra-op Plan:   Post-operative Plan:   Informed Consent: I have reviewed the patients History and Physical, chart, labs and discussed the procedure including the risks, benefits and alternatives for the proposed anesthesia with the patient or authorized representative who has indicated his/her understanding and acceptance.   Dental Advisory Given  Plan Discussed with: CRNA and Anesthesiologist  Anesthesia Plan Comments:         Anesthesia Quick Evaluation

## 2017-09-24 NOTE — Transfer of Care (Signed)
Immediate Anesthesia Transfer of Care Note  Patient: Marilyn Haas  Procedure(s) Performed: ESOPHAGOGASTRODUODENOSCOPY (EGD) WITH PROPOFOL (N/A ) ESOPHAGEAL DILATION (N/A )  Patient Location: PACU  Anesthesia Type:MAC  Level of Consciousness: awake and alert   Airway & Oxygen Therapy: Patient Spontanous Breathing  Post-op Assessment: Report given to RN  Post vital signs: Reviewed  Last Vitals:  Vitals Value Taken Time  BP 102/78 09/24/2017 10:06 AM  Temp    Pulse 79 09/24/2017 10:08 AM  Resp 11 09/24/2017 10:08 AM  SpO2 95 % 09/24/2017 10:08 AM  Vitals shown include unvalidated device data.  Last Pain:  Vitals:   09/24/17 0944  TempSrc:   PainSc: 0-No pain      Patients Stated Pain Goal: 8 (06/34/94 9447)  Complications: No apparent anesthesia complications

## 2017-09-24 NOTE — Anesthesia Postprocedure Evaluation (Signed)
Anesthesia Post Note  Patient: Marilyn Haas  Procedure(s) Performed: ESOPHAGOGASTRODUODENOSCOPY (EGD) WITH PROPOFOL (N/A ) ESOPHAGEAL DILATION (N/A )  Patient location during evaluation: PACU Anesthesia Type: MAC Level of consciousness: awake and alert and oriented Pain management: pain level controlled Vital Signs Assessment: post-procedure vital signs reviewed and stable Respiratory status: spontaneous breathing Cardiovascular status: blood pressure returned to baseline and stable Postop Assessment: no apparent nausea or vomiting Anesthetic complications: no     Last Vitals:  Vitals:   09/24/17 0851 09/24/17 1005  BP: 117/75 102/78  Pulse: 84 (P) 78  Resp: 18 (P) 15  Temp:  (P) 36.8 C    Last Pain:  Vitals:   09/24/17 0944  TempSrc:   PainSc: 0-No pain                 Clarissa Laird

## 2017-09-27 LAB — CYTOLOGY - PAP
Diagnosis: NEGATIVE
HPV: NOT DETECTED

## 2017-09-28 ENCOUNTER — Ambulatory Visit (HOSPITAL_COMMUNITY)
Admission: RE | Admit: 2017-09-28 | Discharge: 2017-09-28 | Disposition: A | Discharge status: Home / Self Care | Payer: Medicare Other | Source: Ambulatory Visit | Attending: Adult Health | Admitting: Adult Health

## 2017-09-28 DIAGNOSIS — R198 Other specified symptoms and signs involving the digestive system and abdomen: Secondary | ICD-10-CM | POA: Diagnosis not present

## 2017-09-28 DIAGNOSIS — R9389 Abnormal findings on diagnostic imaging of other specified body structures: Secondary | ICD-10-CM | POA: Insufficient documentation

## 2017-09-28 DIAGNOSIS — N8500 Endometrial hyperplasia, unspecified: Secondary | ICD-10-CM | POA: Diagnosis not present

## 2017-09-29 ENCOUNTER — Encounter (HOSPITAL_COMMUNITY): Payer: Self-pay | Admitting: Internal Medicine

## 2017-09-29 ENCOUNTER — Telehealth: Payer: Self-pay | Admitting: Adult Health

## 2017-09-29 DIAGNOSIS — R9389 Abnormal findings on diagnostic imaging of other specified body structures: Secondary | ICD-10-CM

## 2017-09-29 HISTORY — DX: Abnormal findings on diagnostic imaging of other specified body structures: R93.89

## 2017-09-29 NOTE — Telephone Encounter (Signed)
Patient called stating that she would like a call back from [place] regarding her results of the ultrasound she had at the hospital on 7/16. Please contact pt

## 2017-09-29 NOTE — Telephone Encounter (Signed)
Patient called stating that she would like a call back from [place] regarding her results of the ultrasound she had at the hospital on 7/16. Please contact pt

## 2017-09-29 NOTE — Telephone Encounter (Signed)
Pt aware that US showed endometrial thickness ?polyp, to discuss with sons and let me know about scheduling endometrial biopsy.She has not had bleeding, and she is aware pap was normal.

## 2017-10-04 DIAGNOSIS — E538 Deficiency of other specified B group vitamins: Secondary | ICD-10-CM | POA: Diagnosis not present

## 2017-10-12 DIAGNOSIS — Z6822 Body mass index (BMI) 22.0-22.9, adult: Secondary | ICD-10-CM | POA: Diagnosis not present

## 2017-10-12 DIAGNOSIS — F330 Major depressive disorder, recurrent, mild: Secondary | ICD-10-CM | POA: Diagnosis not present

## 2017-10-12 DIAGNOSIS — Z1389 Encounter for screening for other disorder: Secondary | ICD-10-CM | POA: Diagnosis not present

## 2017-10-12 DIAGNOSIS — E441 Mild protein-calorie malnutrition: Secondary | ICD-10-CM | POA: Diagnosis not present

## 2017-10-12 DIAGNOSIS — Z0001 Encounter for general adult medical examination with abnormal findings: Secondary | ICD-10-CM | POA: Diagnosis not present

## 2017-11-03 ENCOUNTER — Telehealth: Payer: Self-pay | Admitting: Adult Health

## 2017-11-03 NOTE — Telephone Encounter (Signed)
Left message that calling in follow up, to see if she has talked with sons about getting endometrial biopsy to assess polyp in uterus, please call me back

## 2017-11-05 DIAGNOSIS — E538 Deficiency of other specified B group vitamins: Secondary | ICD-10-CM | POA: Diagnosis not present

## 2017-11-08 DIAGNOSIS — F419 Anxiety disorder, unspecified: Secondary | ICD-10-CM | POA: Diagnosis not present

## 2017-11-08 DIAGNOSIS — Z6821 Body mass index (BMI) 21.0-21.9, adult: Secondary | ICD-10-CM | POA: Diagnosis not present

## 2017-11-08 DIAGNOSIS — M150 Primary generalized (osteo)arthritis: Secondary | ICD-10-CM | POA: Diagnosis not present

## 2017-11-08 DIAGNOSIS — G894 Chronic pain syndrome: Secondary | ICD-10-CM | POA: Diagnosis not present

## 2017-11-18 ENCOUNTER — Ambulatory Visit (INDEPENDENT_AMBULATORY_CARE_PROVIDER_SITE_OTHER): Payer: Medicare Other | Admitting: Obstetrics and Gynecology

## 2017-11-18 ENCOUNTER — Encounter: Payer: Self-pay | Admitting: Obstetrics and Gynecology

## 2017-11-18 ENCOUNTER — Encounter (INDEPENDENT_AMBULATORY_CARE_PROVIDER_SITE_OTHER): Payer: Self-pay

## 2017-11-18 VITALS — BP 115/67 | HR 69 | Ht 62.0 in | Wt 125.4 lb

## 2017-11-18 DIAGNOSIS — N840 Polyp of corpus uteri: Secondary | ICD-10-CM | POA: Diagnosis not present

## 2017-11-18 NOTE — Patient Instructions (Signed)
Hysteroscopy  Hysteroscopy is a procedure used for looking inside the womb (uterus). It may be done for various reasons, including:  · To evaluate abnormal bleeding, fibroid (benign, noncancerous) tumors, polyps, scar tissue (adhesions), and possibly cancer of the uterus.  · To look for lumps (tumors) and other uterine growths.  · To look for causes of why a woman cannot get pregnant (infertility), causes of recurrent loss of pregnancy (miscarriages), or a lost intrauterine device (IUD).  · To perform a sterilization by blocking the fallopian tubes from inside the uterus.    In this procedure, a thin, flexible tube with a tiny light and camera on the end of it (hysteroscope) is used to look inside the uterus. A hysteroscopy should be done right after a menstrual period to be sure you are not pregnant.  LET YOUR HEALTH CARE PROVIDER KNOW ABOUT:  · Any allergies you have.  · All medicines you are taking, including vitamins, herbs, eye drops, creams, and over-the-counter medicines.  · Previous problems you or members of your family have had with the use of anesthetics.  · Any blood disorders you have.  · Previous surgeries you have had.  · Medical conditions you have.  RISKS AND COMPLICATIONS  Generally, this is a safe procedure. However, as with any procedure, complications can occur. Possible complications include:  · Putting a hole in the uterus.  · Excessive bleeding.  · Infection.  · Damage to the cervix.  · Injury to other organs.  · Allergic reaction to medicines.  · Too much fluid used in the uterus for the procedure.    BEFORE THE PROCEDURE  · Ask your health care provider about changing or stopping any regular medicines.  · Do not take aspirin or blood thinners for 1 week before the procedure, or as directed by your health care provider. These can cause bleeding.  · If you smoke, do not smoke for 2 weeks before the procedure.  · In some cases, a medicine is placed in the cervix the day before the procedure.  This medicine makes the cervix have a larger opening (dilate). This makes it easier for the instrument to be inserted into the uterus during the procedure.  · Do not eat or drink anything for at least 8 hours before the surgery.  · Arrange for someone to take you home after the procedure.  PROCEDURE  · You may be given a medicine to relax you (sedative). You may also be given one of the following:  ? A medicine that numbs the area around the cervix (local anesthetic).  ? A medicine that makes you sleep through the procedure (general anesthetic).  · The hysteroscope is inserted through the vagina into the uterus. The camera on the hysteroscope sends a picture to a TV screen. This gives the surgeon a good view inside the uterus.  · During the procedure, air or a liquid is put into the uterus, which allows the surgeon to see better.  · Sometimes, tissue is gently scraped from inside the uterus. These tissue samples are sent to a lab for testing.  What to expect after the procedure  · If you had a general anesthetic, you may be groggy for a couple hours after the procedure.  · If you had a local anesthetic, you will be able to go home as soon as you are stable and feel ready.  · You may have some cramping. This normally lasts for a couple days.  · You may   have bleeding, which varies from light spotting for a few days to menstrual-like bleeding for 3-7 days. This is normal.  · If your test results are not back during the visit, make an appointment with your health care provider to find out the results.  This information is not intended to replace advice given to you by your health care provider. Make sure you discuss any questions you have with your health care provider.  Document Released: 06/08/2000 Document Revised: 08/08/2015 Document Reviewed: 09/29/2012  Elsevier Interactive Patient Education © 2017 Elsevier Inc[employer]

## 2017-11-18 NOTE — Progress Notes (Signed)
Patient ID: [name], female   DOB: 05/22/1940, 77 y.o.   MRN: [id number]     [facility] Visit  @DATE @            Patient name: Marilyn Haas MRN [id number]  Date of birth: [birth year]  CC & HPI:  Marilyn Haas is a 77 y.o. female presenting today for an endometrial biopsy. Transvaginal US on 09/28/2017 showed: Thickened endometrium with focal polypoid area near the fundus measuring 1.7 x 1.4 x 0.6 cm. Endometrial fluid noted. Cannot exclude endometrial cancer. Endometrial sampling should be considered to exclude carcinoma. The patient denies fever, chills or any other symptoms or complaints at this time.   ROS:  ROS - fever - chills All systems are negative except as noted in the HPI and PMH.   Pertinent History Reviewed:   Reviewed:  Medical         Past Medical History:  Diagnosis Date  . Anemia   . Anxiety   . Arthritis   . Breast cancer ([place]) 05/19/2016   Right breast  . Breast disorder    cancer  . Childhood asthma   . Chronic back pain    Scoliosis, stenosis  . Depression   . Diverticulosis   . GERD (gastroesophageal reflux disease)   . Hard of hearing   . Headache   . History of blood transfusion   . History of colon polyps   . History of hiatal hernia   . Insomnia   . Osteopenia   . Osteoporosis   . Personal history of radiation therapy   . Pneumonia    20+ yrs ago  . Thickened endometrium 09/29/2017                              Surgical Hx:    Past Surgical History:  Procedure Laterality Date  . BREAST LUMPECTOMY WITH RADIOACTIVE SEED AND SENTINEL LYMPH NODE BIOPSY Right 07/31/2016   Procedure: BREAST LUMPECTOMY WITH RADIOACTIVE SEED AND SENTINEL LYMPH NODE BIOPSY, INJECT BLUE DYE RIGHT BREAST;  Surgeon: Fanny Skates, MD;  Location: [facility];  Service: General;  Laterality: Right;  . COLONOSCOPY    . 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 ESOPHAGEAL DILATION N/A 09/24/2017   Procedure: ESOPHAGEAL DILATION;  Surgeon: Rogene Houston, MD;  [facility];  Service: Endoscopy;  Laterality: N/A;  . ESOPHAGOGASTRODUODENOSCOPY N/A 01/26/2014   Procedure: ESOPHAGOGASTRODUODENOSCOPY (EGD);  Surgeon: Rogene Houston, MD;  [facility];  Service: Endoscopy;  Laterality: N/A;  1030  . ESOPHAGOGASTRODUODENOSCOPY (EGD) WITH PROPOFOL N/A 09/24/2017   Procedure: ESOPHAGOGASTRODUODENOSCOPY (EGD) WITH PROPOFOL;  Surgeon: Rogene Houston, MD;  [facility];  Service: Endoscopy;  Laterality: N/A;  11:15  . HIP FRACTURE SURGERY     05/2009 left -Mayer Camel  . MALONEY DILATION N/A 01/26/2014   Procedure: Venia Minks DILATION;  Surgeon: Rogene Houston, MD;  [facility];  Service: Endoscopy;  Laterality: N/A;   Medications: Reviewed & Updated - see associated section                       Current Outpatient Medications:  .  ALPRAZolam (XANAX) 0.5 MG tablet, Take 0.5 mg by mouth at bedtime as needed for anxiety.,  Disp: , Rfl:  .  ARIPiprazole (ABILIFY) 2 MG tablet, Take 2 mg by mouth at bedtime., Disp: , Rfl:  .  atorvastatin (LIPITOR) 10 MG tablet, Take 10 mg by mouth daily., Disp: , Rfl:  .  cyanocobalamin (,VITAMIN B-12,) 1000 MCG/ML injection, Inject 1,000 mcg into the muscle every 30 (thirty) days., Disp: , Rfl:  .  docusate sodium (COLACE) 100 MG capsule, Take 2 capsules (200 mg total) by mouth daily. (Patient taking differently: Take 300 mg by mouth daily as needed for mild constipation. ), Disp: 10 capsule, Rfl: 0 .  DULoxetine (CYMBALTA) 30 MG capsule, Take 120 mg by mouth daily. , Disp: , Rfl:  .  famotidine (PEPCID) 20 MG tablet, Take 1 tablet (20 mg total) by mouth 2 (two) times daily., Disp: 60 tablet, Rfl: 11 .  ferrous sulfate 325 (65 FE) MG tablet, Take 325 mg by mouth daily with breakfast., Disp: , Rfl:  .  gabapentin (NEURONTIN) 100 MG  capsule, Take 1 capsule (100 mg total) by mouth at bedtime., Disp: 30 capsule, Rfl: 6 .  HYDROcodone-acetaminophen (NORCO) 10-325 MG tablet, Take 1 tablet by mouth 3 (three) times daily as needed for moderate pain. , Disp: , Rfl:  .  Polyethyl Glycol-Propyl Glycol (SYSTANE OP), Place 1 drop into both eyes daily as needed (for dry eyes)., Disp: , Rfl:  .  polyethylene glycol powder (GLYCOLAX/MIRALAX) powder, Take 8.5 g by mouth daily. (Patient taking differently: Take 8.5 g by mouth daily as needed for moderate constipation. ), Disp: 255 g, Rfl: 0 .  TAMOXIFEN CITRATE PO, Take by mouth daily., Disp: , Rfl:    Social History: Reviewed -  reports that she quit smoking about 56 years ago. Her smoking use included cigarettes. She has a 1.00 pack-year smoking history. She has never used smokeless tobacco.  Objective Findings:  Vitals: Blood pressure 115/67, pulse 69, height 5\' 2"  (1.575 m), weight 125 lb 6.4 oz (56.9 kg).  PHYSICAL EXAMINATION General appearance - alert, well appearing, and in no distress, oriented to person, place, and time and normal appearing weight Mental status - alert, oriented to person, place, and time, normal mood, behavior, speech, dress, motor activity, and thought processes, affect appropriate to mood  PELVIC Uterus - retroverted uterus and fluid collection in endometrial fundus. Endometrial polyp.  Assessment & Plan:   A:  1.  Endometrial biopsy UNSUCCESSFUL. 1% local anesthetic used  P:  1.  Schedule hysteroscopy at the hospital  By signing my name below, I, De Burrs, attest that this documentation has been prepared under the direction and in the presence of Jonnie Kind, MD. Electronically Signed: De Burrs, Medical Scribe. 11/18/17. 1:29 PM.  I personally performed the services described in this documentation, which was SCRIBED in my presence. The recorded information has been reviewed and considered accurate. It has been edited as necessary  during review. Jonnie Kind, MD

## 2017-11-22 ENCOUNTER — Telehealth: Payer: Self-pay | Admitting: *Deleted

## 2017-11-22 NOTE — Telephone Encounter (Signed)
Patient's son called with questions and concerns with upcoming surgery for his mother.  Asking about the necessity of it and if possibly cancer.  Asking for Dr Glo Herring to return his call in regards to this.

## 2017-11-23 ENCOUNTER — Ambulatory Visit (INDEPENDENT_AMBULATORY_CARE_PROVIDER_SITE_OTHER): Payer: Medicare Other | Admitting: Internal Medicine

## 2017-11-24 ENCOUNTER — Telehealth: Payer: Self-pay | Admitting: *Deleted

## 2017-11-24 NOTE — Telephone Encounter (Signed)
Son called back requesting a return call from Dr Glo Herring regarding his mother's surgery.  He has a few questions and concerns.

## 2017-11-30 ENCOUNTER — Telehealth: Payer: Self-pay | Admitting: Obstetrics and Gynecology

## 2017-11-30 NOTE — Telephone Encounter (Signed)
The u/s findings of a 1cmx1cm x0.6 cm polyp discussed with Oswaldo Conroy.  Risk of malignancy considered low, estimated at  5 %, and  And treatment options of proceeding directly with hysteroscopy and removal reviewed, with options of following x 6 months and rescan discussed. Pt and family leaning toward following and rescanning in 6 months.  [name] made clearly aware that tissue dx required to be sure of tissue type.  Billy requests that we help find a counsellor to assist with pt's depression, now that she is widowed x 1 yr. Will ask Derrek Monaco to call [place] with any referral suggestions.

## 2017-12-01 NOTE — Telephone Encounter (Signed)
Doristine Locks the number for [facility] in [place] [phone], for his mom.

## 2017-12-02 ENCOUNTER — Inpatient Hospital Stay (HOSPITAL_COMMUNITY): Admission: RE | Admit: 2017-12-02 | Payer: Medicare Other | Source: Ambulatory Visit

## 2017-12-07 ENCOUNTER — Ambulatory Visit: Admit: 2017-12-07 | Payer: Medicare Other | Admitting: Obstetrics and Gynecology

## 2017-12-07 SURGERY — DILATATION AND CURETTAGE /HYSTEROSCOPY
Anesthesia: General

## 2017-12-09 DIAGNOSIS — D649 Anemia, unspecified: Secondary | ICD-10-CM | POA: Diagnosis not present

## 2017-12-14 ENCOUNTER — Ambulatory Visit (INDEPENDENT_AMBULATORY_CARE_PROVIDER_SITE_OTHER): Payer: Medicare Other | Admitting: Internal Medicine

## 2017-12-15 ENCOUNTER — Telehealth: Payer: Self-pay | Admitting: Oncology

## 2017-12-15 NOTE — Telephone Encounter (Signed)
Patient called to cancel °

## 2017-12-16 ENCOUNTER — Inpatient Hospital Stay: Payer: Medicare Other | Admitting: Oncology

## 2017-12-16 ENCOUNTER — Inpatient Hospital Stay: Payer: Medicare Other

## 2017-12-20 ENCOUNTER — Ambulatory Visit (INDEPENDENT_AMBULATORY_CARE_PROVIDER_SITE_OTHER): Payer: Medicare Other | Admitting: Internal Medicine

## 2017-12-20 ENCOUNTER — Encounter (INDEPENDENT_AMBULATORY_CARE_PROVIDER_SITE_OTHER): Payer: Self-pay | Admitting: Internal Medicine

## 2017-12-20 ENCOUNTER — Other Ambulatory Visit (INDEPENDENT_AMBULATORY_CARE_PROVIDER_SITE_OTHER): Payer: Self-pay | Admitting: *Deleted

## 2017-12-20 VITALS — BP 116/70 | HR 76 | Temp 98.3°F | Resp 18 | Ht 65.5 in | Wt 118.7 lb

## 2017-12-20 DIAGNOSIS — R634 Abnormal weight loss: Secondary | ICD-10-CM | POA: Diagnosis not present

## 2017-12-20 DIAGNOSIS — K219 Gastro-esophageal reflux disease without esophagitis: Secondary | ICD-10-CM

## 2017-12-20 DIAGNOSIS — D508 Other iron deficiency anemias: Secondary | ICD-10-CM | POA: Diagnosis not present

## 2017-12-20 DIAGNOSIS — D649 Anemia, unspecified: Secondary | ICD-10-CM | POA: Diagnosis not present

## 2017-12-20 DIAGNOSIS — K5900 Constipation, unspecified: Secondary | ICD-10-CM

## 2017-12-20 DIAGNOSIS — R1319 Other dysphagia: Secondary | ICD-10-CM

## 2017-12-20 DIAGNOSIS — R1310 Dysphagia, unspecified: Secondary | ICD-10-CM | POA: Diagnosis not present

## 2017-12-20 LAB — CBC
HCT: 35.8 % (ref 35.0–45.0)
HEMOGLOBIN: 12.2 g/dL (ref 11.7–15.5)
MCH: 30.8 pg (ref 27.0–33.0)
MCHC: 34.1 g/dL (ref 32.0–36.0)
MCV: 90.4 fL (ref 80.0–100.0)
MPV: 9.5 fL (ref 7.5–12.5)
PLATELETS: 320 10*3/uL (ref 140–400)
RBC: 3.96 10*6/uL (ref 3.80–5.10)
RDW: 12.4 % (ref 11.0–15.0)
WBC: 7.4 10*3/uL (ref 3.8–10.8)

## 2017-12-20 NOTE — Patient Instructions (Signed)
Increase physical activity as tolerated. Must include fruits and vegetables in your daily meals. Weight check in 1 month.

## 2017-12-20 NOTE — Progress Notes (Signed)
Presenting complaint;  Follow-up for dysphagia and constipation.  Database and subjective:  Patient is 77 year old Caucasian female who is here for scheduled visit.  She was last seen on 08/31/2017 for solid food dysphagia and constipation.  She underwent EGD with dilation of high-grade Schatzki's ring.  She was also advised to increase intake of fiber rich foods.  She states she is not having swallowing difficulty anymore but she feels miserable.  She denies heartburn nausea vomiting abdominal pain melena or rectal bleeding.  Her bowels may move every couple of days.  She feels depressed.  She feels Cymbalta is not helping anymore.  She has an appointment to be seen a psychiatrist later this week.  She states she has been through a lot in the last year and a half.  She lost her husband due to pancreatic carcinoma.  She also had to go to therapy for breast carcinoma.  She also has been worried about her sister who ended up with amputation of 1 of her legs and she lost her sister-in-law who was murdered in November last year. She lives alone.  She has 3 sons and none of them live close by.  She she has friends but she is staying aloof.  She does not prepare her meals anymore.  She just eats sandwiches and hamburgers.  She has lost 12 pounds since her last visit.    Current Medications: Outpatient Encounter Medications as of 12/20/2017  Medication Sig  . ALPRAZolam (XANAX) 0.5 MG tablet Take 0.5 mg by mouth at bedtime as needed for anxiety.  . ARIPiprazole (ABILIFY) 2 MG tablet Take 2 mg by mouth at bedtime.  Marland Kitchen atorvastatin (LIPITOR) 10 MG tablet Take 10 mg by mouth daily.  . cyanocobalamin (,VITAMIN B-12,) 1000 MCG/ML injection Inject 1,000 mcg into the muscle every 30 (thirty) days.  Marland Kitchen docusate sodium (COLACE) 100 MG capsule Take 2 capsules (200 mg total) by mouth daily. (Patient taking differently: Take 300 mg by mouth daily as needed for mild constipation. )  . DULoxetine (CYMBALTA) 30 MG capsule  Take 120 mg by mouth daily.   . famotidine (PEPCID) 20 MG tablet Take 1 tablet (20 mg total) by mouth 2 (two) times daily.  . ferrous sulfate 325 (65 FE) MG tablet Take 325 mg by mouth daily with breakfast.  . gabapentin (NEURONTIN) 100 MG capsule Take 1 capsule (100 mg total) by mouth at bedtime.  Marland Kitchen HYDROcodone-acetaminophen (NORCO) 10-325 MG tablet Take 1 tablet by mouth 3 (three) times daily as needed for moderate pain.   Vladimir Faster Glycol-Propyl Glycol (SYSTANE OP) Place 1 drop into both eyes daily as needed (for dry eyes).  . polyethylene glycol powder (GLYCOLAX/MIRALAX) powder Take 8.5 g by mouth daily. (Patient taking differently: Take 8.5 g by mouth daily as needed for moderate constipation. )  . TAMOXIFEN CITRATE PO Take by mouth daily.   No facility-administered encounter medications on file as of 12/20/2017.      Objective: Blood pressure 116/70, pulse 76, temperature 98.3 F (36.8 C), temperature source Oral, resp. rate 18, height 5' 5.5" (1.664 m), weight 118 lb 11.2 oz (53.8 kg). Patient is alert and in no acute distress. Conjunctiva is pink. Sclera is nonicteric Oropharyngeal mucosa is normal. No neck masses or thyromegaly noted. Cardiac exam with regular rhythm normal S1 and S2. No murmur or gallop noted. Lungs are clear to auscultation. Abdomen is flat soft and nontender with organomegaly or masses. No LE edema or clubbing noted.    Assessment:  #  1.  Esophageal dysphagia.  Recent EGD revealed high-grade Schatzki's ring which was dilated/disrupted and she now has no swallowing difficulty.  #2.  Constipation.  Constipation appears to be due to poor eating habits.  #3.  GERD.  She is doing well with therapy.  #4.  Weight loss.  Weight loss appears to be due to diminished oral intake secondary to depression.  #5. history of mild anemia.   Plan:  Patient encouraged to increase physical activity as tolerated.  It will have multiple benefits for her including  increase in her appetite. Patient advised to incorporate fruits and vegetables with the meals daily. Weight check in 1 month. Office visit in 4 months.

## 2017-12-22 ENCOUNTER — Encounter: Payer: Medicare Other | Admitting: Obstetrics and Gynecology

## 2018-01-03 DIAGNOSIS — Z23 Encounter for immunization: Secondary | ICD-10-CM | POA: Diagnosis not present

## 2018-01-05 ENCOUNTER — Encounter (HOSPITAL_COMMUNITY): Payer: Self-pay | Admitting: Psychiatry

## 2018-01-05 ENCOUNTER — Ambulatory Visit (INDEPENDENT_AMBULATORY_CARE_PROVIDER_SITE_OTHER): Payer: Medicare Other | Admitting: Psychiatry

## 2018-01-05 VITALS — BP 134/83 | HR 88 | Ht 65.5 in | Wt 120.0 lb

## 2018-01-05 DIAGNOSIS — G4700 Insomnia, unspecified: Secondary | ICD-10-CM

## 2018-01-05 DIAGNOSIS — R413 Other amnesia: Secondary | ICD-10-CM | POA: Diagnosis not present

## 2018-01-05 DIAGNOSIS — F332 Major depressive disorder, recurrent severe without psychotic features: Secondary | ICD-10-CM | POA: Diagnosis not present

## 2018-01-05 DIAGNOSIS — F419 Anxiety disorder, unspecified: Secondary | ICD-10-CM

## 2018-01-05 MED ORDER — VENLAFAXINE HCL ER 150 MG PO CP24: 150.0000 mg | ORAL_CAPSULE | Freq: Every day | ORAL | 0 refills | Status: DC

## 2018-01-05 MED ORDER — VENLAFAXINE HCL ER 37.5 MG PO CP24: ORAL_CAPSULE | ORAL | 0 refills | Status: DC

## 2018-01-05 NOTE — Progress Notes (Signed)
Psychiatric Initial Adult Assessment   Patient Identification: Marilyn Haas MRN:  [id number] Date of Evaluation:  01/05/2018 Referral Source: Dr. Redmond School Chief Complaint:   Chief Complaint    Depression; Psychiatric Evaluation     Visit Diagnosis:    ICD-10-CM   1. Severe episode of recurrent major depressive disorder, without psychotic features ([place]) F33.2     History of Present Illness:   Marilyn Haas is a 77 y.o. year old female with a history of depression, esophageal dysphasia, weight loss, history of breast cancer s/p lumpectomy, radiation, currently on anastrozole, who is referred for depression.   She states that she lost her husband on July 2018 from pancreatic cancer after 54 years marriage. She tearfully describes her husband's hospital course. She was found out to have breast cancer over the past year.  Although she tried anastrozole, it was switched back to tamoxifen given side effect of diaphoresis.  She lost her husband's sister, and children from murder.  She has a sister, who twisted her ankle, ended up being losing the leg. She wishes to be feeling better and energy to see this sister in [place].   She feels depressed.  She has significant fatigue and tends to stay in the house doing nothing.  She has poor appetite.  She denies SI. She feels anxious, tense. She used to drink several cups of vodka, last use several years ago. She denies drug use. She takes xanax 0.25-1 mg at night.   Medication:  She states that she has been taking fluoxetine (she initially reports that she did it on her own judgement, and not recommended by her provider) as it works very well for her sister. She had limited benefit and switched back to duloxetine 90 mg daily. She is unsure of benefit from duloxetine. She is unsure if she is on Abilify (it was in her medication list in Epic). Obtained medication list from pharmacy-fluoxetine 40 mg filled in September. Duloxetine filled in  July. No recent prescription of Abilify.    Per PMP,  On hydrocodone, xanax 0.5 mg TID filled on 12/20/2017   Wt Readings from Last 3 Encounters:  01/05/18 120 lb (54.4 kg)  12/20/17 118 lb 11.2 oz (53.8 kg)  11/18/17 125 lb 6.4 oz (56.9 kg)    Associated Signs/Symptoms: Depression Symptoms:  depressed mood, anhedonia, insomnia, fatigue, anxiety, (Hypo) Manic Symptoms:  denies decreased need for sleep, euphoria Anxiety Symptoms:  Excessive Worry, Panic Symptoms, Psychotic Symptoms:  denies Ah, VH PTSD Symptoms: Negative  Past Psychiatric History:  Outpatient: depression for years Psychiatry admission: denies Previous suicide attempt: denies  Past trials of medication: lexapro, duloxetine History of violence: denies Legal: DUI 20 years ago  Previous Psychotropic Medications: Yes   Substance Abuse History in the last 12 months:  No.  Consequences of Substance Abuse: NA  Past Medical History:  Past Medical History:  Diagnosis Date  . Anemia   . Anxiety   . Arthritis   . Breast cancer ([place]) 05/19/2016   Right breast  . Breast disorder    cancer  . Childhood asthma   . Chronic back pain    Scoliosis, stenosis  . Depression   . Diverticulosis   . GERD (gastroesophageal reflux disease)   . Hard of hearing   . Headache   . History of blood transfusion   . History of colon polyps   . History of hiatal hernia   . Insomnia   . Osteopenia   . Osteoporosis   .  Personal history of radiation therapy   . Pneumonia    20+ yrs ago  . Thickened endometrium 09/29/2017    Past Surgical History:  Procedure Laterality Date  . BREAST LUMPECTOMY WITH RADIOACTIVE SEED AND SENTINEL LYMPH NODE BIOPSY Right 07/31/2016   Procedure: BREAST LUMPECTOMY WITH RADIOACTIVE SEED AND SENTINEL LYMPH NODE BIOPSY, INJECT BLUE DYE RIGHT BREAST;  Surgeon: Fanny Skates, MD;  Location: [facility];  Service: General;  Laterality: Right;  . COLONOSCOPY    . 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 ESOPHAGEAL DILATION N/A 09/24/2017   Procedure: ESOPHAGEAL DILATION;  Surgeon: Rogene Houston, MD;  [facility];  Service: Endoscopy;  Laterality: N/A;  . ESOPHAGOGASTRODUODENOSCOPY N/A 01/26/2014   Procedure: ESOPHAGOGASTRODUODENOSCOPY (EGD);  Surgeon: Rogene Houston, MD;  [facility];  Service: Endoscopy;  Laterality: N/A;  1030  . ESOPHAGOGASTRODUODENOSCOPY (EGD) WITH PROPOFOL N/A 09/24/2017   Procedure: ESOPHAGOGASTRODUODENOSCOPY (EGD) WITH PROPOFOL;  Surgeon: Rogene Houston, MD;  [facility];  Service: Endoscopy;  Laterality: N/A;  11:15  . HIP FRACTURE SURGERY     05/2009 left -[facility] Camel  . MALONEY DILATION N/A 01/26/2014   Procedure: Venia Minks DILATION;  Surgeon: Rogene Houston, MD;  [facility];  Service: Endoscopy;  Laterality: N/A;    Family Psychiatric History:  Sister- depression on fluoxetine., mother- depression  Family History:  Family History  Problem Relation Age of Onset  . Heart failure Mother   . Diabetes Mother   . Hypertension Mother   . Depression Mother   . Other Father        stomach ulcers; abd aneursym  . Hypertension Sister   . Heart failure Sister   . Diabetes Sister   . Depression Sister   . Diabetes Sister        prediabetes  . Cancer Maternal Aunt        breast    Social History:   Social History   Socioeconomic History  . Marital status: Married    Spouse name: Not on file  . Number of children: 3  . Years of education: Not on file  . Highest education level: Not on file  Occupational History    Employer: RETIRED  Social Needs  . Financial resource strain: Not on file  . Food insecurity:    Worry: Not on file    Inability: Not on file  . Transportation needs:    Medical: Not on file    Non-medical: Not on file  Tobacco Use  .  Smoking status: Former Smoker    Packs/day: 0.50    Years: 2.00    Pack years: 1.00    Types: Cigarettes    Last attempt to quit: 01/15/1961    Years since quitting: 57.0  . Smokeless tobacco: Never Used  Substance and Sexual Activity  . Alcohol use: No    Alcohol/week: 0.0 standard drinks  . Drug use: No  . Sexual activity: Not Currently    Birth control/protection: Post-menopausal  Lifestyle  . Physical activity:    Days per week: Not on file    Minutes per session: Not on file  . Stress: Not on file  Relationships  . Social connections:    Talks on phone: Not on file    Gets together: Not on file    Attends religious  service: Not on file    Active member of club or organization: Not on file    Attends meetings of clubs or organizations: Not on file    Relationship status: Not on file  Other Topics Concern  . Not on file  Social History Narrative  . Not on file    Additional Social History Widowed after 64 years of marriage. 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 Avelox [Moxifloxacin Hcl In Nacl] Other (See Comments)    Altered mental status    Metabolic Disorder Labs: Lab Results  Component Value Date   HGBA1C 5.4 02/17/2013   MPG 108 02/17/2013   No results found for: PROLACTIN Lab Results  Component Value Date   CHOL 209 (H) 12/13/2014   TRIG 95 12/13/2014   HDL 114 12/13/2014   CHOLHDL 1.8 12/13/2014   VLDL 19 12/13/2014   LDLCALC 76 12/13/2014   LDLCALC 88 02/17/2013     Current Medications: Current Outpatient Medications  Medication Sig Dispense Refill  . ALPRAZolam (XANAX) 0.5 MG tablet Take 0.5 mg by mouth at bedtime as needed for anxiety.    Marland Kitchen atorvastatin (LIPITOR) 10 MG tablet Take 10 mg by mouth daily.    . cyanocobalamin (,VITAMIN B-12,) 1000  MCG/ML injection Inject 1,000 mcg into the muscle every 30 (thirty) days.    Marland Kitchen docusate sodium (COLACE) 100 MG capsule Take 2 capsules (200 mg total) by mouth daily. (Patient taking differently: Take 300 mg by mouth daily as needed for mild constipation. ) 10 capsule 0  . DULoxetine (CYMBALTA) 30 MG capsule Take 120 mg by mouth daily.     . famotidine (PEPCID) 20 MG tablet Take 1 tablet (20 mg total) by mouth 2 (two) times daily. 60 tablet 11  . ferrous sulfate 325 (65 FE) MG tablet Take 325 mg by mouth daily with breakfast.    . FLUoxetine (PROZAC) 20 MG tablet Take 20 mg by mouth daily.    Marland Kitchen gabapentin (NEURONTIN) 100 MG capsule Take 1 capsule (100 mg total) by mouth at bedtime. 30 capsule 6  . HYDROcodone-acetaminophen (NORCO) 10-325 MG tablet Take 1 tablet by mouth 3 (three) times daily as needed for moderate pain.     Vladimir Faster Glycol-Propyl Glycol (SYSTANE OP) Place 1 drop into both eyes daily as needed (for dry eyes).    . polyethylene glycol powder (GLYCOLAX/MIRALAX) powder Take 8.5 g by mouth daily. (Patient taking differently: Take 8.5 g by mouth daily as needed for moderate constipation. ) 255 g 0  . TAMOXIFEN CITRATE PO Take by mouth daily.    Marland Kitchen venlafaxine XR (EFFEXOR-XR) 150 MG 24 hr capsule Take 1 capsule (150 mg total) by mouth daily with breakfast. 30 capsule 0  . venlafaxine XR (EFFEXOR-XR) 37.5 MG 24 hr capsule 37.5 mg daily for one week, then 75 mg daily 21 capsule 0   No current facility-administered medications for this visit.     Neurologic: Headache: No Seizure: No Paresthesias:No  Musculoskeletal: Strength & Muscle Tone: within normal limits Gait & Station: normal Patient leans: N/A  Psychiatric Specialty Exam: Review of Systems  Psychiatric/Behavioral: Positive for depression. Negative for hallucinations, memory loss, substance abuse and suicidal ideas. The patient is nervous/anxious and has insomnia.   All other systems reviewed and are negative.   Blood  pressure 134/83, pulse 88, height 5' 5.5" (1.664 m), weight 120 lb (54.4 kg), SpO2 96 %.Body mass index is 19.67 kg/m.  General Appearance: Fairly Groomed  Eye Contact:  Good  Speech:  Clear and Coherent slightly increased in latency  Volume:  Normal  Mood:  Depressed  Affect:  Appropriate, Congruent and Tearful  Thought Process:  Coherent  Orientation:  Full (Time, Place, and Person)  Thought Content:  Logical  Suicidal Thoughts:  No  Homicidal Thoughts:  No  Memory:  Immediate;   Good  Judgement:  Fair  Insight:  Shallow  Psychomotor Activity:  Normal  Concentration:  Concentration: Good and Attention Span: Good  Recall:  Poor  Fund of Knowledge:Good  Language: Good  Akathisia:  No  Handed:  Right  AIMS (if indicated):  N/A  Assets:  Communication Skills Desire for Improvement  ADL's:  Intact  Cognition: WNL  Sleep:  poor   Assessment Marilyn Haas is a 77 y.o. year old female with a history of depression, esophageal dysphasia, weight loss, history of breast cancer s/p lumpectomy, radiation, currently on anastrozole, who is referred for depression.   # MDD, moderate, recurrent without psychotic features Exam is notable for slight delay in speech (although it is difficult to discern whether it is attributable to cognitive deficits), and the patient complains of depressive symptoms, which has worsened over the past year.  Psychosocial stressors including grief of loss of her husband from pancreatic cancer, her sister was medical condition, and loss of her sister-in-law and her children from murder.  We will switch from duloxetine to Effexor given she has limited benefit from duloxetine, and also its potential interaction with tamoxifen. Discussed potential side effect of hypertension.  Will consider adjunctive therapy with mirtazapine or quetiapine in the future, which would be beneficial for appetite loss. Will continue Xanax as needed for anxiety at this time. She is advised  to take lower dose given concern of memory loss as described below. Discussed behavioral activation.   # Memory loss Exam is notable for deficits in delayed recall, although it is difficult to discern in the context of depression. Will consider MOCA evaluation at the next visit  Plan 1. Decrease duloxetine 60 mg daily for one week, then 30 mg daily for one week, then discontinue 2. Start Effexor 37.5 mg daily for one week, then 75 mg daily for one week, then 150 mg daily  3. Continue Xanax 0.25-0.5 mg at night as needed for anxiety 4. Check TSH by your provider 5. Return to clinic in one month for 30 mins  The patient demonstrates the following risk factors for suicide: Chronic risk factors for suicide include: psychiatric disorder of depression. Acute risk factors for suicide include: unemployment, social withdrawal/isolation and loss (financial, interpersonal, professional). Protective factors for this patient include: positive social support, coping skills and hope for the future. Considering these factors, the overall suicide risk at this point appears to be low. Patient is appropriate for outpatient follow up.   Treatment Plan Summary: Plan as above   Norman Clay, MD 10/23/20195:17 PM

## 2018-01-05 NOTE — Patient Instructions (Signed)
1. Decrease duloxetine 60 mg daily for one week, then 30 mg daily for one week, then discontinue 2. Start Effexor 37.5 mg daily for one week, then 75 mg daily for one week, then 150 mg daily  3. Continue Xanax 0.25-0.5 mg at night as needed for anxiety 4. Check TSH by your provider 5. Return to clinic in one month for 30 mins

## 2018-01-17 ENCOUNTER — Encounter

## 2018-01-17 ENCOUNTER — Ambulatory Visit (HOSPITAL_COMMUNITY): Payer: Self-pay | Admitting: Psychiatry

## 2018-01-20 ENCOUNTER — Ambulatory Visit (HOSPITAL_COMMUNITY): Payer: Self-pay | Admitting: Psychiatry

## 2018-02-04 NOTE — Progress Notes (Signed)
BH MD/PA/NP OP Progress Note  02/08/2018 1:33 PM Marilyn Haas  MRN:  [id number]  Chief Complaint:  Chief Complaint    Depression; Follow-up     HPI:  Patient presents for follow-up appointment for depression.  She states that she feels the same.  She does not want to do anything.  She states that "Everything is overwhelming." She feels that she has "total different life," living by herself after her husband deceased.  She misses him every day.  She visits her sister in [place] who had amputation.  Although she wants to help her sister more, it has been difficult given her back pain.  She states that she feels guilty that she feels depressed despite she is "so much able," referring to her sister with physical limitation. She is isolative and tends to stay in the house. She had wished that she would have felt better with Effexor around holiday season.  She has insomnia.  She feels fatigued.  She has fair concentration.  She denies SI.  She feels anxious and tense.  She has occasional panic attacks.  Per patient, she was prescribed some sleep medication instead of taking  Xanax. She denies side effect from Effexor.   Wt Readings from Last 3 Encounters:  02/08/18 116 lb (52.6 kg)  01/05/18 120 lb (54.4 kg)  12/20/17 118 lb 11.2 oz (53.8 kg)    On hydrocodone Xanax filled on 01/19/2018    Visit Diagnosis:    ICD-10-CM   1. MDD (major depressive disorder), recurrent episode, moderate ([place]) F33.1     Past Psychiatric History: Please see initial evaluation for full details. I have reviewed the history. No updates at this time.     Past Medical History:  Past Medical History:  Diagnosis Date  . Anemia   . Anxiety   . Arthritis   . Breast cancer ([place]) 05/19/2016   Right breast  . Breast disorder    cancer  . Childhood asthma   . Chronic back pain    Scoliosis, stenosis  . Depression   . Diverticulosis   . GERD (gastroesophageal reflux disease)   . Hard of hearing   .  Headache   . History of blood transfusion   . History of colon polyps   . History of hiatal hernia   . Insomnia   . Osteopenia   . Osteoporosis   . Personal history of radiation therapy   . Pneumonia    20+ yrs ago  . Thickened endometrium 09/29/2017    Past Surgical History:  Procedure Laterality Date  . BREAST LUMPECTOMY WITH RADIOACTIVE SEED AND SENTINEL LYMPH NODE BIOPSY Right 07/31/2016   Procedure: BREAST LUMPECTOMY WITH RADIOACTIVE SEED AND SENTINEL LYMPH NODE BIOPSY, INJECT BLUE DYE RIGHT BREAST;  Surgeon: Fanny Skates, MD;  Location: [facility];  Service: General;  Laterality: Right;  . COLONOSCOPY    . 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 ESOPHAGEAL DILATION N/A 09/24/2017   Procedure: ESOPHAGEAL DILATION;  Surgeon: Rogene Houston, MD;  [facility];  Service: Endoscopy;  Laterality: N/A;  . ESOPHAGOGASTRODUODENOSCOPY N/A 01/26/2014   Procedure: ESOPHAGOGASTRODUODENOSCOPY (EGD);  Surgeon: Rogene Houston, MD;  [facility];  Service: Endoscopy;  Laterality: N/A;  1030  . ESOPHAGOGASTRODUODENOSCOPY (EGD) WITH PROPOFOL N/A 09/24/2017   Procedure: ESOPHAGOGASTRODUODENOSCOPY (EGD) WITH PROPOFOL;  Surgeon: Hildred Laser  U, MD;  [facility];  Service: Endoscopy;  Laterality: N/A;  11:15  . HIP FRACTURE SURGERY     05/2009 left -Mayer Camel  . MALONEY DILATION N/A 01/26/2014   Procedure: Venia Minks DILATION;  Surgeon: Rogene Houston, MD;  [facility];  Service: Endoscopy;  Laterality: N/A;    Family Psychiatric History: Please see initial evaluation for full details. I have reviewed the history. No updates at this time.     Family History:  Family History  Problem Relation Age of Onset  . Heart failure Mother   . Diabetes Mother   . Hypertension Mother   . Depression Mother   . Other Father         stomach ulcers; abd aneursym  . Hypertension Sister   . Heart failure Sister   . Diabetes Sister   . Depression Sister   . Diabetes Sister        prediabetes  . Cancer Maternal Aunt        breast    Social History:  Social History   Socioeconomic History  . Marital status: Married    Spouse name: Not on file  . Number of children: 3  . Years of education: Not on file  . Highest education level: Not on file  Occupational History    Employer: RETIRED  Social Needs  . Financial resource strain: Not on file  . Food insecurity:    Worry: Not on file    Inability: Not on file  . Transportation needs:    Medical: Not on file    Non-medical: Not on file  Tobacco Use  . Smoking status: Former Smoker    Packs/day: 0.50    Years: 2.00    Pack years: 1.00    Types: Cigarettes    Last attempt to quit: 01/15/1961    Years since quitting: 57.1  . Smokeless tobacco: Never Used  Substance and Sexual Activity  . Alcohol use: No    Alcohol/week: 0.0 standard drinks  . Drug use: No  . Sexual activity: Not Currently    Birth control/protection: Post-menopausal  Lifestyle  . Physical activity:    Days per week: Not on file    Minutes per session: Not on file  . Stress: Not on file  Relationships  . Social connections:    Talks on phone: Not on file    Gets together: Not on file    Attends religious service: Not on file    Active member of club or organization: Not on file    Attends meetings of clubs or organizations: Not on file    Relationship status: Not on file  Other Topics Concern  . Not on file  Social History Narrative  . Not on file    Allergies:  Allergies  Allergen Reactions  . Avelox [Moxifloxacin Hcl In Nacl] Other (See Comments)    Altered mental status    Metabolic Disorder Labs: Lab Results  Component Value Date   HGBA1C 5.4 02/17/2013   MPG 108 02/17/2013   No results found for: PROLACTIN Lab Results  Component Value Date   CHOL 209  (H) 12/13/2014   TRIG 95 12/13/2014   HDL 114 12/13/2014   CHOLHDL 1.8 12/13/2014   VLDL 19 12/13/2014   LDLCALC 76 12/13/2014   LDLCALC 88 02/17/2013   No results found for: TSH  Therapeutic Level Labs: No results found for: LITHIUM No results found for: VALPROATE No components found for:  CBMZ  Current Medications: Current Outpatient Medications  Medication Sig Dispense Refill  . ALPRAZolam (XANAX) 0.5 MG tablet Take 0.5 mg by mouth at bedtime as needed for anxiety.    Marland Kitchen atorvastatin (LIPITOR) 10 MG tablet Take 10 mg by mouth daily.    . cyanocobalamin (,VITAMIN B-12,) 1000 MCG/ML injection Inject 1,000 mcg into the muscle every 30 (thirty) days.    Marland Kitchen docusate sodium (COLACE) 100 MG capsule Take 2 capsules (200 mg total) by mouth daily. (Patient taking differently: Take 300 mg by mouth daily as needed for mild constipation. ) 10 capsule 0  . famotidine (PEPCID) 20 MG tablet Take 1 tablet (20 mg total) by mouth 2 (two) times daily. 60 tablet 11  . ferrous sulfate 325 (65 FE) MG tablet Take 325 mg by mouth daily with breakfast.    . gabapentin (NEURONTIN) 100 MG capsule Take 1 capsule (100 mg total) by mouth at bedtime. 30 capsule 6  . HYDROcodone-acetaminophen (NORCO) 10-325 MG tablet Take 1 tablet by mouth 3 (three) times daily as needed for moderate pain.     Vladimir Faster Glycol-Propyl Glycol (SYSTANE OP) Place 1 drop into both eyes daily as needed (for dry eyes).    . polyethylene glycol powder (GLYCOLAX/MIRALAX) powder Take 8.5 g by mouth daily. (Patient taking differently: Take 8.5 g by mouth daily as needed for moderate constipation. ) 255 g 0  . TAMOXIFEN CITRATE PO Take by mouth daily.    Marland Kitchen venlafaxine XR (EFFEXOR-XR) 150 MG 24 hr capsule 225 mg daily (150 mg + 75 mg) 30 capsule 0  . venlafaxine XR (EFFEXOR-XR) 75 MG 24 hr capsule 225 mg daily (150 mg + 75 mg) 90 capsule 0   No current facility-administered medications for this visit.      Musculoskeletal: Strength &  Muscle Tone: within normal limits Gait & Station: normal Patient leans: N/A  Psychiatric Specialty Exam: Review of Systems  All other systems reviewed and are negative.   Blood pressure 123/80, pulse (!) 108, height 5' 5.5" (1.664 m), weight 116 lb (52.6 kg), SpO2 97 %.Body mass index is 19.01 kg/m.  General Appearance: Fairly Groomed  Eye Contact:  Good  Speech:  Clear and Coherent  Volume:  Normal  Mood:  Depressed  Affect:  Appropriate, Congruent and Tearful  Thought Process:  Coherent  Orientation:  Full (Time, Place, and Person)  Thought Content: Logical   Suicidal Thoughts:  No  Homicidal Thoughts:  No  Memory:  Immediate;   Good  Judgement:  Good  Insight:  Fair  Psychomotor Activity:  Normal  Concentration:  Concentration: NA and Attention Span: Good  Recall:  Good  Fund of Knowledge: Good  Language: Good  Akathisia:  No  Handed:  Right  AIMS (if indicated): not done  Assets:  Communication Skills Desire for Improvement  ADL's:  Intact  Cognition: WNL  Sleep:  Poor   Screenings: PHQ2-9     Office Visit from 09/23/2017 in [facility] Follow Up  from 12/08/2016 in [facility] from 08/13/2016 in [facility]  PHQ-2 Total Score  6  1  1   PHQ-9 Total Score  24  -  -       Assessment and Plan:  Marilyn Haas is a 78 y.o. year old female with a history of depression,  esophageal dysphasia, weight loss, history of breast cancer s/p lumpectomy, radiation, currently on anastrozole , who presents for follow up  appointment for MDD (major depressive disorder), recurrent episode, moderate ([place])  # MDD, moderate, recurrent without psychotic features Patient continues to report depressive symptoms since switching from duloxetine to Effexor.  Psychosocial stressors including grief of loss of her husband from pancreatic cancer, her sister with medical condition, loss of her sister-in-law  and her sister's children from murder.  We will do further up titration of Effexor to target depression.  Discussed potential side effect of hypertension.  Will consider adjunctive therapy in the future if she has limited benefit from the medication.  Discussed behavioral activation.  She will greatly benefit from CBT; will make referral.   Plan 1. Increase Effexor 225 mg daily  3. Check TSH by your provider 4. Return to clinic in one month for 30 mins 5. Referral to therapy (she is not on xanax anymore per patient report)  The patient demonstrates the following risk factors for suicide: Chronic risk factors for suicide include: psychiatric disorder of depression. Acute risk factors for suicide include: unemployment, social withdrawal/isolation and loss (financial, interpersonal, professional). Protective factors for this patient include: positive social support, coping skills and hope for the future. Considering these factors, the overall suicide risk at this point appears to be low. Patient is appropriate for outpatient follow up.  The duration of this appointment visit was 30 minutes of face-to-face time with the patient.  Greater than 50% of this time was spent in counseling, explanation of  diagnosis, planning of further management, and coordination of care.  Norman Clay, MD 02/08/2018, 1:33 PM

## 2018-02-07 DIAGNOSIS — E538 Deficiency of other specified B group vitamins: Secondary | ICD-10-CM | POA: Diagnosis not present

## 2018-02-07 DIAGNOSIS — Z6820 Body mass index (BMI) 20.0-20.9, adult: Secondary | ICD-10-CM | POA: Diagnosis not present

## 2018-02-07 DIAGNOSIS — G894 Chronic pain syndrome: Secondary | ICD-10-CM | POA: Diagnosis not present

## 2018-02-07 DIAGNOSIS — F330 Major depressive disorder, recurrent, mild: Secondary | ICD-10-CM | POA: Diagnosis not present

## 2018-02-07 DIAGNOSIS — F419 Anxiety disorder, unspecified: Secondary | ICD-10-CM | POA: Diagnosis not present

## 2018-02-07 DIAGNOSIS — F5101 Primary insomnia: Secondary | ICD-10-CM | POA: Diagnosis not present

## 2018-02-08 ENCOUNTER — Ambulatory Visit (INDEPENDENT_AMBULATORY_CARE_PROVIDER_SITE_OTHER): Payer: Medicare Other | Admitting: Psychiatry

## 2018-02-08 VITALS — BP 123/80 | HR 108 | Ht 65.5 in | Wt 116.0 lb

## 2018-02-08 DIAGNOSIS — F331 Major depressive disorder, recurrent, moderate: Secondary | ICD-10-CM

## 2018-02-08 MED ORDER — VENLAFAXINE HCL ER 75 MG PO CP24: ORAL_CAPSULE | ORAL | 0 refills | Status: DC

## 2018-02-08 MED ORDER — VENLAFAXINE HCL ER 150 MG PO CP24: ORAL_CAPSULE | ORAL | 0 refills | Status: DC

## 2018-02-08 NOTE — Patient Instructions (Addendum)
1. Increase Effexor 225 mg daily  2. Check TSH by your provider 4. Return to clinic in two months for 30 mins 5. Referral to therapy

## 2018-02-15 ENCOUNTER — Encounter: Payer: Self-pay | Admitting: Cardiology

## 2018-02-15 NOTE — Progress Notes (Signed)
Cardiology Office Note  Date: 02/16/2018   ID: Karista, Aispuro 04/24/1940, MRN [id number]  PCP: Redmond School, MD  Primary Cardiologist: Rozann Lesches, MD   Chief Complaint  Patient presents with  . Cardiac follow-up    History of Present Illness: [name] is a 77 y.o. female last seen in December 2018.  She is here today for a routine visit.  She does not report any progression in sense of intermittent palpitations.  Tells me that she still has not adjusted to living alone since her husband passed away in 06-Nov-2022 of last year. She is close to her children, spoke to her son in [place] just before the visit today.  She does not report chest pain with activity, no syncope.  She does have a history of prior PVCs but has not been on specific medical therapy for this so far, LVEF normal.  I personally reviewed her ECG today which shows sinus tachycardia with decreased R wave progression.  Heart rate settled down into the 80s to 90s after being seated for a few minutes.  Blood pressure is normal.  Past Medical History:  Diagnosis Date  . Anemia   . Anxiety   . Arthritis   . Breast cancer ([place]) 05/19/2016   Right breast  . Childhood asthma   . Chronic back pain    Scoliosis, stenosis  . Depression   . Diverticulosis   . GERD (gastroesophageal reflux disease)   . Hard of hearing   . Headache   . History of blood transfusion   . History of colon polyps   . History of hiatal hernia   . History of pneumonia   . Insomnia   . Osteopenia   . Osteoporosis   . Personal history of radiation therapy   . Thickened endometrium 09/29/2017    Past Surgical History:  Procedure Laterality Date  . BREAST LUMPECTOMY WITH RADIOACTIVE SEED AND SENTINEL LYMPH NODE BIOPSY Right 07/31/2016   Procedure: BREAST LUMPECTOMY WITH RADIOACTIVE SEED AND SENTINEL LYMPH NODE BIOPSY, INJECT BLUE DYE RIGHT BREAST;  Surgeon: Fanny Skates, MD;  Location: [facility];  Service: General;   Laterality: Right;  . COLONOSCOPY    . 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 ESOPHAGEAL DILATION N/A 09/24/2017   Procedure: ESOPHAGEAL DILATION;  Surgeon: Rogene Houston, MD;  [facility];  Service: Endoscopy;  Laterality: N/A;  . ESOPHAGOGASTRODUODENOSCOPY N/A 01/26/2014   Procedure: ESOPHAGOGASTRODUODENOSCOPY (EGD);  Surgeon: Rogene Houston, MD;  [facility];  Service: Endoscopy;  Laterality: N/A;  1030  . ESOPHAGOGASTRODUODENOSCOPY (EGD) WITH PROPOFOL N/A 09/24/2017   Procedure: ESOPHAGOGASTRODUODENOSCOPY (EGD) WITH PROPOFOL;  Surgeon: Rogene Houston, MD;  [facility];  Service: Endoscopy;  Laterality: N/A;  11:15  . HIP FRACTURE SURGERY     05/2009 left -Mayer Camel  . MALONEY DILATION N/A 01/26/2014   Procedure: Venia Minks DILATION;  Surgeon: Rogene Houston, MD;  [facility];  Service: Endoscopy;  Laterality: N/A;    Current Outpatient Medications  Medication Sig Dispense Refill  . ALPRAZolam (XANAX) 0.5 MG tablet Take 0.5 mg by mouth at bedtime as needed for anxiety.    Marland Kitchen atorvastatin (LIPITOR) 10 MG tablet Take 10 mg by mouth daily.    . cyanocobalamin (,VITAMIN B-12,) 1000 MCG/ML injection Inject 1,000 mcg into the muscle  every 30 (thirty) days.    Marland Kitchen docusate sodium (COLACE) 100 MG capsule Take 2 capsules (200 mg total) by mouth daily. (Patient taking differently: Take 300 mg by mouth daily as needed for mild constipation. ) 10 capsule 0  . famotidine (PEPCID) 20 MG tablet Take 1 tablet (20 mg total) by mouth 2 (two) times daily. 60 tablet 11  . ferrous sulfate 325 (65 FE) MG tablet Take 325 mg by mouth daily with breakfast.    . gabapentin (NEURONTIN) 100 MG capsule Take 1 capsule (100 mg total) by mouth at bedtime. 30 capsule 6  . HYDROcodone-acetaminophen (NORCO) 10-325 MG tablet Take 1  tablet by mouth 3 (three) times daily as needed for moderate pain.     Vladimir Faster Glycol-Propyl Glycol (SYSTANE OP) Place 1 drop into both eyes daily as needed (for dry eyes).    . polyethylene glycol powder (GLYCOLAX/MIRALAX) powder Take 8.5 g by mouth daily. (Patient taking differently: Take 8.5 g by mouth daily as needed for moderate constipation. ) 255 g 0  . TAMOXIFEN CITRATE PO Take by mouth daily.    Marland Kitchen venlafaxine XR (EFFEXOR-XR) 150 MG 24 hr capsule 225 mg daily (150 mg + 75 mg) 30 capsule 0  . venlafaxine XR (EFFEXOR-XR) 75 MG 24 hr capsule 225 mg daily (150 mg + 75 mg) 90 capsule 0   No current facility-administered medications for this visit.    Allergies:  Avelox [moxifloxacin hcl in nacl]   Social History: The patient  reports that she quit smoking about 57 years ago. Her smoking use included cigarettes. She has a 1.00 pack-year smoking history. She has never used smokeless tobacco. She reports that she does not drink alcohol or use drugs.    ROS:  Please see the history of present illness. Otherwise, complete review of systems is positive for anxiety.  All other systems are reviewed and negative.   Physical Exam: VS:  BP 124/78 (BP Location: Left Arm)   Pulse 96   Ht 5' 5.5" (1.664 m)   Wt 118 lb (53.5 kg)   SpO2 97%   BMI 19.34 kg/m , BMI Body mass index is 19.34 kg/m.  Wt Readings from Last 3 Encounters:  02/16/18 118 lb (53.5 kg)  12/20/17 118 lb 11.2 oz (53.8 kg)  11/18/17 125 lb 6.4 oz (56.9 kg)    General: Thin elderly woman, appears comfortable at rest. HEENT: Conjunctiva and lids normal, oropharynx clear. Neck: Supple, no elevated JVP or carotid bruits, no thyromegaly. Lungs: Clear to auscultation, nonlabored breathing at rest. Cardiac: Regular rate and rhythm, no S3, soft systolic murmur. Abdomen: Soft, nontender, bowel sounds present. Extremities: No pitting edema, distal pulses 2+.  ECG: I personally reviewed the tracing from 02/16/2017 which showed  sinus rhythm with decreased R wave progression.  Recent Labwork: 09/20/2017: BUN 10; Creatinine, Ser 0.90; Potassium 4.3; Sodium 139 12/20/2017: Hemoglobin 12.2; Platelets 320     Component Value Date/Time   CHOL 209 (H) 12/13/2014 1132   CHOL 150 02/17/2013 0934   TRIG 95 12/13/2014 1132   TRIG 111 02/17/2013 0934   HDL 114 12/13/2014 1132   HDL 40 02/17/2013 0934   CHOLHDL 1.8 12/13/2014 1132   VLDL 19 12/13/2014 1132   LDLCALC 76 12/13/2014 1132   LDLCALC 88 02/17/2013 0934    Other Studies Reviewed Today:  Echocardiogram 02/18/2016: Study Conclusions  - Left ventricle: The cavity size was normal. Wall thickness was   increased in a pattern of mild LVH. Systolic function  was normal.   The estimated ejection fraction was in the range of 55% to 60%.   Wall motion was normal; there were no regional wall motion   abnormalities. Doppler parameters are consistent with abnormal   left ventricular relaxation (grade 1 diastolic dysfunction). - Aortic valve: Mildly calcified annulus. Trileaflet. There was   mild regurgitation. - Mitral valve: There was mild regurgitation. - Right atrium: Central venous pressure (est): 3 mm Hg. - Atrial septum: No defect or patent foramen ovale was identified. - Tricuspid valve: There was mild regurgitation. - Pulmonary arteries: PA peak pressure: 22 mm Hg (S).  Impressions:  - Mild LVH with LVEF 55-60% and grade 1 diastolic dysfunction. Mild   mitral regurgitation. Mildly calcified aortic annulus with mild   aortic regurgitation. Mild tricuspid regurgitation with normal   estimated PASP 22 mmHg.  Assessment and Plan:  1.  History of palpitations with previously documented PVCs.  She has had no syncope or obvious progression in symptoms since last encounter.  Also tends to run a fast heart rate in sinus rhythm at baseline.  I reviewed her ECG today.  I did talk with her about the possibility of considering a low-dose beta-blocker if her symptoms  escalate, but no strong indication to start now.  We will continue to follow clinically.  2.  History of GERD and esophageal dysmotility, follows with Dr. Laural Golden.  Current medicines were reviewed with the patient today.   Orders Placed This Encounter  Procedures  . EKG 12-Lead    Disposition: Follow-up in 6 months.  Signed, Satira Sark, MD, [facility] 02/16/2018 2:06 PM    [facility] [address] [place], [place] [address] Phone: [phone]; Fax: [phone]

## 2018-02-16 ENCOUNTER — Encounter: Payer: Self-pay | Admitting: Cardiology

## 2018-02-16 ENCOUNTER — Ambulatory Visit (INDEPENDENT_AMBULATORY_CARE_PROVIDER_SITE_OTHER): Payer: Medicare Other | Admitting: Cardiology

## 2018-02-16 VITALS — BP 124/78 | HR 96 | Ht 65.5 in | Wt 118.0 lb

## 2018-02-16 DIAGNOSIS — I493 Ventricular premature depolarization: Secondary | ICD-10-CM

## 2018-02-16 DIAGNOSIS — K219 Gastro-esophageal reflux disease without esophagitis: Secondary | ICD-10-CM | POA: Diagnosis not present

## 2018-02-16 NOTE — Patient Instructions (Signed)

## 2018-02-17 ENCOUNTER — Other Ambulatory Visit: Payer: Self-pay | Admitting: *Deleted

## 2018-02-17 MED ORDER — GABAPENTIN 100 MG PO CAPS: 100.0000 mg | ORAL_CAPSULE | Freq: Every day | ORAL | 3 refills | Status: DC

## 2018-03-17 ENCOUNTER — Other Ambulatory Visit: Payer: Self-pay

## 2018-03-17 MED ORDER — GABAPENTIN 100 MG PO CAPS: 100.0000 mg | ORAL_CAPSULE | Freq: Every day | ORAL | 3 refills | Status: DC

## 2018-03-17 MED ORDER — TAMOXIFEN CITRATE 10 MG PO TABS: 10.0000 mg | ORAL_TABLET | Freq: Every day | ORAL | 2 refills | Status: DC

## 2018-03-22 ENCOUNTER — Other Ambulatory Visit (HOSPITAL_COMMUNITY): Payer: Self-pay | Admitting: Psychiatry

## 2018-03-22 ENCOUNTER — Telehealth: Payer: Self-pay | Admitting: Oncology

## 2018-03-22 ENCOUNTER — Telehealth (HOSPITAL_COMMUNITY): Payer: Self-pay

## 2018-03-22 MED ORDER — VENLAFAXINE HCL ER 150 MG PO CP24: ORAL_CAPSULE | ORAL | 0 refills | Status: DC

## 2018-03-22 NOTE — Telephone Encounter (Signed)
sent 

## 2018-03-22 NOTE — Telephone Encounter (Signed)
Patient needs a refill on Venlafaxine 150mg . Please send to [facility]

## 2018-03-22 NOTE — Progress Notes (Signed)
[facility]  [phone] [phone]     ID: Marilyn Haas DOB: Jul 28, 1940  MR#: [id number]  ONG#:[id number]  Patient Care Team: Elfredia Nevins, MD as PCP - General (Internal Medicine) Jonelle Sidle, MD as PCP - Cardiology (Cardiology) Jaylyn Iyer, Valentino Hue, MD as Consulting Physician (Oncology) Claud Kelp, MD as Consulting Physician (General Surgery) Dorothy Puffer, MD as Consulting Physician (Radiation Oncology) Malissa Hippo, MD as Consulting Physician (Gastroenterology) Ollen Gross, MD as Consulting Physician (Orthopedic Surgery) Axel Filler, Larna Daughters, NP as Nurse Practitioner (Hematology and Oncology) Tilda Burrow, MD as Consulting Physician (Obstetrics and Gynecology) OTHER MD:  CHIEF COMPLAINT: Estrogen receptor positive lobular breast cancer  CURRENT TREATMENT: tamoxifen  BREAST CANCER HISTORY: From the original intake note:  The patient had bilateral screening mammography at the [facility] 04/22/2016 showing an area of possible asymmetry and calcifications in the right breast. Right diagnostic mammography with tomography and right breast ultrasonography 05/12/2016 showed the breast density to be category C. In the right breast upper outer quadrant there was a. A real lower area of architectural distortion which was palpable in the 11:00 radians 1 cm from the nipple. Ultrasonography confirmed an irregular hypoechoic mass at this location measuring 1.4 cm. Ultrasound of the right axilla was unremarkable.  Biopsy of the right breast mass in question 05/19/2016 showed ([id number]) and invasive lobular carcinoma, E-cadherin negative, estrogen receptor 90% positive, progesterone receptor 15% positive, both with strong staining intensity, with an MIB-1 of 5%, and no HER-2 amplification, the signals ratio being 1.51 and the number per cell 3.10.  Breast MRI 06/08/2016 confirmed an irregular enhancing mass in the upper-outer  quadrant of the right breast measuring 1.5 cm, with adjacent biopsy clip artifact. There was an area of contiguous non-mass like enhancement bringing the total dimension to 1.9 cm. However there was no evidence of multicentric disease and no evidence of metastatic lymphadenopathy.  The patient's subsequent history is as detailed below   INTERVAL HISTORY: Marilyn Haas returns today for follow-up and treatment of her estrogen receptor positive breast cancer. She is accompanied by her son Marilyn Haas. She continues on Tamoxifen with good tolerance. She reports occasional hot flashes, but they are not an issue. She reports vaginal wetness and wears a pad.  On 09/28/2017 she had a transabdominal and transvaginal pelvic ultrasonography under Dr. Valentina Lucks.  This did show a thickened endometrium with a focal polypoid area measuring 1.7 cm.  Biopsy was recommended by Dr. Valentina Lucks and this was attempted by Dr. Emelda Fear on 11/18/2017.  However the procedure was not successful and she was scheduled for hysteroscopy with dilatation and curettage on 12/07/2017. Her son, Marilyn Haas, spoke with her doctor, and they agreed to wait and see how it develops before considering a biopsy due to the small percentage (roughly 5%) of finding malignancy.   REVIEW OF SYSTEMS: Marilyn Haas reports her sleep is not good. She notes she is seeing a Therapist, sports and a Veterinary surgeon. Her son reports the cymbalta she takes may interact with the tamoxifen. The patient denies unusual headaches, visual changes, nausea, vomiting, stiff neck, dizziness, or gait imbalance. There has been no cough, phlegm production, or pleurisy, no chest pain or pressure, and no change in bowel or bladder habits. The patient denies fever, rash, bleeding, unexplained fatigue or unexplained weight loss. A detailed review of systems was otherwise entirely negative.   PAST MEDICAL HISTORY: Past Medical History:  Diagnosis Date  . Anemia   . Anxiety   . Arthritis   .  Breast cancer  (HCC) 05/19/2016   Right breast  . Childhood asthma   . Chronic back pain    Scoliosis, stenosis  . Depression   . Diverticulosis   . GERD (gastroesophageal reflux disease)   . Hard of hearing   . Headache   . History of blood transfusion   . History of colon polyps   . History of hiatal hernia   . History of pneumonia   . Insomnia   . Osteopenia   . Osteoporosis   . Personal history of radiation therapy   . Thickened endometrium 09/29/2017    PAST SURGICAL HISTORY: Past Surgical History:  Procedure Laterality Date  . BREAST LUMPECTOMY WITH RADIOACTIVE SEED AND SENTINEL LYMPH NODE BIOPSY Right 07/31/2016   Procedure: BREAST LUMPECTOMY WITH RADIOACTIVE SEED AND SENTINEL LYMPH NODE BIOPSY, INJECT BLUE DYE RIGHT BREAST;  Surgeon: Claud Kelp, MD;  Location: [facility] OR;  Service: General;  Laterality: Right;  . COLONOSCOPY    . 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 07/04/2014   Procedure: LEFT CONVERSION TO TOTAL HIP ARTHROPLASTY;  Surgeon: Ollen Gross, MD;  Location: [facility] ORS;  Service: Orthopedics;  Laterality: Left;  . ESOPHAGEAL DILATION N/A 09/24/2017   Procedure: ESOPHAGEAL DILATION;  Surgeon: Malissa Hippo, MD;  Location: [facility] ENDO SUITE;  Service: Endoscopy;  Laterality: N/A;  . ESOPHAGOGASTRODUODENOSCOPY N/A 01/26/2014   Procedure: ESOPHAGOGASTRODUODENOSCOPY (EGD);  Surgeon: Malissa Hippo, MD;  Location: [facility] SU[facility];  Service: Endoscopy;  Laterality: N/A;  1030  . ESOPHAGOGASTRODUODENOSCOPY (EGD) WITH PROPOFOL N/A 09/24/2017   Procedure: ESOPHAGOGASTRODUODENOSCOPY (EGD) WITH PROPOFOL;  Surgeon: Malissa Hippo, MD;  Location: [facility] SU[facility];  Service: Endoscopy;  Laterality: N/A;  11:15  . HIP FRACTURE SURGERY     05/2009 left -Turner Daniels  . MALONEY DILATION N/A 01/26/2014   Procedure: [name] DILATION;  Surgeon: Malissa Hippo, MD;  Location: [facility];  Service: Endoscopy;  Laterality: N/A;    FAMILY HISTORY Family History  Problem Relation Age of Onset  . Heart failure Mother    . Diabetes Mother   . Hypertension Mother   . Depression Mother   . Other Father        stomach ulcers; abd aneursym  . Hypertension Sister   . Heart failure Sister   . Diabetes Sister   . Depression Sister   . Diabetes Sister        prediabetes  . Cancer Maternal Aunt        breast  The patient's father died at age 21 from rupture of an abdominal aortic aneurysm. The patient's mother died with heart failure in the setting of diabetes at age 31. The patient had no brothers, 2 sisters. One sister was diagnosed with breast cancer at age 59. Her other sister has had complications from diabetes. One maternal aunt was diagnosed with breast cancer in her 49s  GYNECOLOGIC HISTORY:  No LMP recorded. Patient is postmenopausal. Menarche age 57, first live birth age 69, which the patient is aware increases the risk of breast cancer. She is GX P3. She does not quite recall when she went through the change of life but thinks he was in her 62s. She did not use hormone replacement.  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 Her husband "Leavy Cella" Auguste, 83, was in Airline pilot. He passed away in 2018/08/04due to cancer and various other medical problems. Son, Trinna Post lives in [place] where he works in Armed forces training and education officer. Son Willaim Sheng lives in [place] and  works in Magazine features editor. Son Cletis Athens lives in [place] and works as a Curator. The patient has 12 grandchildren and 2 great-grandchildren. She is a Control and instrumentation engineer.     ADVANCED DIRECTIVES: Not addressed at 06/22/2016 meeting   HEALTH MAINTENANCE: Social History   Tobacco Use  . Smoking status: Former Smoker    Packs/day: 0.50    Years: 2.00    Pack years: 1.00    Types: Cigarettes    Last attempt to quit: 01/15/1961    Years since quitting: 57.2  . Smokeless tobacco: Never Used  Substance Use Topics  . Alcohol use: No    Alcohol/week: 0.0 standard drinks  . Drug use: No     Colonoscopy: UTD/Rehman  PAP: remote  Bone  density: 04/22/2016   Allergies  Allergen Reactions  . Avelox [Moxifloxacin Hcl In Nacl] Other (See Comments)    Altered mental status    Current Outpatient Medications  Medication Sig Dispense Refill  . ALPRAZolam (XANAX) 0.5 MG tablet Take 0.5 mg by mouth at bedtime as needed for anxiety.    Marland Kitchen atorvastatin (LIPITOR) 10 MG tablet Take 10 mg by mouth daily.    . cyanocobalamin (,VITAMIN B-12,) 1000 MCG/ML injection Inject 1,000 mcg into the muscle every 30 (thirty) days.    Marland Kitchen docusate sodium (COLACE) 100 MG capsule Take 2 capsules (200 mg total) by mouth daily. (Patient taking differently: Take 300 mg by mouth daily as needed for mild constipation. ) 10 capsule 0  . famotidine (PEPCID) 20 MG tablet Take 1 tablet (20 mg total) by mouth 2 (two) times daily. 60 tablet 11  . ferrous sulfate 325 (65 FE) MG tablet Take 325 mg by mouth daily with breakfast.    . gabapentin (NEURONTIN) 100 MG capsule Take 1 capsule (100 mg total) by mouth at bedtime. 30 capsule 3  . HYDROcodone-acetaminophen (NORCO) 10-325 MG tablet Take 1 tablet by mouth 3 (three) times daily as needed for moderate pain.     Bertram Gala Glycol-Propyl Glycol (SYSTANE OP) Place 1 drop into both eyes daily as needed (for dry eyes).    . polyethylene glycol powder (GLYCOLAX/MIRALAX) powder Take 8.5 g by mouth daily. (Patient taking differently: Take 8.5 g by mouth daily as needed for moderate constipation. ) 255 g 0  . tamoxifen (NOLVADEX) 10 MG tablet Take 1 tablet (10 mg total) by mouth daily. 90 tablet 2  . venlafaxine XR (EFFEXOR-XR) 150 MG 24 hr capsule 225 mg daily (150 mg + 75 mg) 30 capsule 0  . venlafaxine XR (EFFEXOR-XR) 75 MG 24 hr capsule 225 mg daily (150 mg + 75 mg) 90 capsule 0   No current facility-administered medications for this visit.     OBJECTIVE: Older white woman who appears stated age  Vitals:   03/23/18 1154  BP: 137/78  Pulse: 82  Resp: 18  Temp: 97.7 F (36.5 C)  SpO2: 99%     Body mass index is  20.12 kg/m.    ECOG FS:1 - Symptomatic but completely ambulatory  Sclerae unicteric, pupils round and equal No cervical or supraclavicular adenopathy Lungs no rales or rhonchi Heart regular rate and rhythm Abd soft, nontender, positive bowel sounds MSK no focal spinal tenderness, no upper extremity lymphedema Neuro: nonfocal, well oriented, appropriate affect Breasts: The right breast is status post lumpectomy and radiation.  There are the expected skin thickening changes but no evidence of local recurrence.  The left breast is benign.  Both axillae are benign.  LAB RESULTS:  CMP     Component Value Date/Time   NA 142 03/23/2018 1133   NA 139 12/21/2016 0931   K 4.1 03/23/2018 1133   K 4.2 12/21/2016 0931   CL 106 03/23/2018 1133   CO2 28 03/23/2018 1133   CO2 28 12/21/2016 0931   GLUCOSE 88 03/23/2018 1133   GLUCOSE 86 12/21/2016 0931   BUN 15 03/23/2018 1133   BUN 14.1 12/21/2016 0931   CREATININE 0.89 03/23/2018 1133   CREATININE 0.9 12/21/2016 0931   CALCIUM 8.7 (L) 03/23/2018 1133   CALCIUM 8.8 12/21/2016 0931   PROT 6.8 03/23/2018 1133   PROT 7.0 12/21/2016 0931   ALBUMIN 3.6 03/23/2018 1133   ALBUMIN 3.7 12/21/2016 0931   AST 12 (L) 03/23/2018 1133   AST 15 12/21/2016 0931   ALT 10 03/23/2018 1133   ALT 8 12/21/2016 0931   ALKPHOS 62 03/23/2018 1133   ALKPHOS 62 12/21/2016 0931   BILITOT 0.5 03/23/2018 1133   BILITOT 0.40 12/21/2016 0931   GFRNONAA >60 03/23/2018 1133   GFRAA >60 03/23/2018 1133    No results found for: Dorene Ar, A1GS, A2GS, BETS, BETA2SER, GAMS, MSPIKE, SPEI  No results found for: KPAFRELGTCHN, LAMBDASER, [facility]  Lab Results  Component Value Date   WBC 7.3 03/23/2018   NEUTROABS 4.6 03/23/2018   HGB 10.5 (L) 03/23/2018   HCT 33.1 (L) 03/23/2018   MCV 98.5 03/23/2018   PLT 223 03/23/2018      Chemistry      Component Value Date/Time   NA 142 03/23/2018 1133   NA 139 12/21/2016 0931   K 4.1 03/23/2018 1133    K 4.2 12/21/2016 0931   CL 106 03/23/2018 1133   CO2 28 03/23/2018 1133   CO2 28 12/21/2016 0931   BUN 15 03/23/2018 1133   BUN 14.1 12/21/2016 0931   CREATININE 0.89 03/23/2018 1133   CREATININE 0.9 12/21/2016 0931      Component Value Date/Time   CALCIUM 8.7 (L) 03/23/2018 1133   CALCIUM 8.8 12/21/2016 0931   ALKPHOS 62 03/23/2018 1133   ALKPHOS 62 12/21/2016 0931   AST 12 (L) 03/23/2018 1133   AST 15 12/21/2016 0931   ALT 10 03/23/2018 1133   ALT 8 12/21/2016 0931   BILITOT 0.5 03/23/2018 1133   BILITOT 0.40 12/21/2016 0931       No results found for: LABCA2  No components found for: ONGEXB284  No results for input(s): INR in the last 168 hours.  Urinalysis    Component Value Date/Time   COLORURINE YELLOW 10/11/2015 1211   APPEARANCEUR CLEAR 10/11/2015 1211   LABSPEC <1.005 (L) 10/11/2015 1211   PHURINE 6.5 10/11/2015 1211   GLUCOSEU 100 (A) 10/11/2015 1211   HGBUR TRACE (A) 10/11/2015 1211   BILIRUBINUR NEGATIVE 10/11/2015 1211   KETONESUR NEGATIVE 10/11/2015 1211   PROTEINUR NEGATIVE 10/11/2015 1211   UROBILINOGEN 1.0 06/26/2014 1400   NITRITE NEGATIVE 10/11/2015 1211   LEUKOCYTESUR NEGATIVE 10/11/2015 1211     STUDIES: Outside studies reviewed  ELIGIBLE FOR AVAILABLE RESEARCH PROTOCOL: no  ASSESSMENT: 78 y.o. [place] woman status post right breast upper outer quadrant biopsy 05/19/2016 for a clinical T1c N0, stage IA invasive lobular breast cancer, estrogen and progesterone receptor positive, HER-2 nonamplified, with an MIB-15%.   (1) Status post right lumpectomy and sentinel lymph node sampling 07/31/2016 for a pT2 pN0, stage IB invasive lobular carcinoma, grade 2, with negative margins  (2) adjuvant radiation  09/01/2016 to 09/29/2016 Site/dose:    1.  The Right breast was treated to 42.5 Gy in 17 fractions at 2.5 Gy per fraction. 2. The Right breast was boosted to 7.5 Gy in 3 fractions at 2.5 Gy per fraction.   (3)  started tamoxifen  10/26/2016  (a) bone density 04/22/2016 shows a T score in the right femur of -2.0, right radius -2.4   (b) switched to anastrozole August 2019 because of concerns for vaginal wetness and hot flashes with tamoxifen  (c) switched back to tamoxifen June 2019 as the patient felt there was no improvement in prior issues and she would like to "strengthen her bones".  PLAN:  Marilyn Haas is now a year and a half out from definitive surgery for her breast cancer with no evidence of disease recurrence.  This is favorable.  She is tolerating tamoxifen well.  The plan is to continue antiestrogens for a total of 5 years.  Today we discussed issues relating to depression and she seems to be improving in that regard since she started the venlafaxine.  This has the added advantage of that there are no concerns regarding interactions with tamoxifen.  She is going to seek counseling which I think also will be helpful to her as she goes forward and the memory of her husband's death recedes  Strongly urged her to participate in a senior citizens exercise program 3 times a week.  Apparently there is a really good 1 at the [employer].  We again discussed the fact that tamoxifen can cause cancer of the uterus.  The chance of the polyp-like lesion that was seen on ultrasound actually being carcinoma is quite low, but not 0.  I think at this point, since she has had no bleeding issues, perhaps repeating the ultrasonography would be the simplest thing to do and then if there have been any further changes or more worrisome findings they could proceed to biopsy which unfortunately is going to require hysteroscopy  I am setting her up for mammography and bone density in April.  She will return to see me in July.  She knows to call for any other issue that may develop before the next visit.   Abryana Lykens, Valentino Hue, MD  03/23/18 12:52 PM Medical Oncology and Hematology [facility] [address]  [place], [place] [address] Tel. [phone]    Fax. [phone]  I, Mickie Bail, am acting as scribe for Dr. Valentino Hue. Kyel Purk.  I, Ruthann Cancer MD, have reviewed the above documentation for accuracy and completeness, and I agree with the above.

## 2018-03-22 NOTE — Telephone Encounter (Signed)
Called to confirm appt per 1/2 sch message - pt is aware of appt date and time

## 2018-03-23 ENCOUNTER — Inpatient Hospital Stay: Payer: Medicare Other | Attending: Oncology | Admitting: Oncology

## 2018-03-23 ENCOUNTER — Inpatient Hospital Stay: Payer: Medicare Other

## 2018-03-23 VITALS — BP 137/78 | HR 82 | Temp 97.7°F | Resp 18 | Ht 65.5 in | Wt 122.8 lb

## 2018-03-23 DIAGNOSIS — C50411 Malignant neoplasm of upper-outer quadrant of right female breast: Secondary | ICD-10-CM

## 2018-03-23 DIAGNOSIS — Z170 Estrogen receptor positive status [ER+]: Secondary | ICD-10-CM | POA: Insufficient documentation

## 2018-03-23 DIAGNOSIS — E441 Mild protein-calorie malnutrition: Secondary | ICD-10-CM | POA: Diagnosis not present

## 2018-03-23 DIAGNOSIS — Z1389 Encounter for screening for other disorder: Secondary | ICD-10-CM | POA: Diagnosis not present

## 2018-03-23 DIAGNOSIS — C50911 Malignant neoplasm of unspecified site of right female breast: Secondary | ICD-10-CM | POA: Diagnosis not present

## 2018-03-23 DIAGNOSIS — N859 Noninflammatory disorder of uterus, unspecified: Secondary | ICD-10-CM

## 2018-03-23 DIAGNOSIS — Z79810 Long term (current) use of selective estrogen receptor modulators (SERMs): Secondary | ICD-10-CM | POA: Diagnosis not present

## 2018-03-23 DIAGNOSIS — J329 Chronic sinusitis, unspecified: Secondary | ICD-10-CM | POA: Diagnosis not present

## 2018-03-23 DIAGNOSIS — G4700 Insomnia, unspecified: Secondary | ICD-10-CM | POA: Diagnosis not present

## 2018-03-23 DIAGNOSIS — G894 Chronic pain syndrome: Secondary | ICD-10-CM | POA: Diagnosis not present

## 2018-03-23 DIAGNOSIS — Z87891 Personal history of nicotine dependence: Secondary | ICD-10-CM | POA: Diagnosis not present

## 2018-03-23 DIAGNOSIS — H1132 Conjunctival hemorrhage, left eye: Secondary | ICD-10-CM | POA: Diagnosis not present

## 2018-03-23 DIAGNOSIS — H11002 Unspecified pterygium of left eye: Secondary | ICD-10-CM | POA: Diagnosis not present

## 2018-03-23 DIAGNOSIS — F419 Anxiety disorder, unspecified: Secondary | ICD-10-CM | POA: Diagnosis not present

## 2018-03-23 DIAGNOSIS — M8580 Other specified disorders of bone density and structure, unspecified site: Secondary | ICD-10-CM

## 2018-03-23 DIAGNOSIS — Z6821 Body mass index (BMI) 21.0-21.9, adult: Secondary | ICD-10-CM | POA: Diagnosis not present

## 2018-03-23 LAB — COMPREHENSIVE METABOLIC PANEL
ALT: 10 U/L (ref 0–44)
AST: 12 U/L — AB (ref 15–41)
Albumin: 3.6 g/dL (ref 3.5–5.0)
Alkaline Phosphatase: 62 U/L (ref 38–126)
Anion gap: 8 (ref 5–15)
BILIRUBIN TOTAL: 0.5 mg/dL (ref 0.3–1.2)
BUN: 15 mg/dL (ref 8–23)
CALCIUM: 8.7 mg/dL — AB (ref 8.9–10.3)
CO2: 28 mmol/L (ref 22–32)
Chloride: 106 mmol/L (ref 98–111)
Creatinine, Ser: 0.89 mg/dL (ref 0.44–1.00)
GFR calc Af Amer: >60 mL/min (ref >60)
Glucose, Bld: 88 mg/dL (ref 70–99)
POTASSIUM: 4.1 mmol/L (ref 3.5–5.1)
Sodium: 142 mmol/L (ref 135–145)
TOTAL PROTEIN: 6.8 g/dL (ref 6.5–8.1)

## 2018-03-23 LAB — CBC WITH DIFFERENTIAL/PLATELET
ABS IMMATURE GRANULOCYTES: 0.03 10*3/uL (ref 0.00–0.07)
Basophils Absolute: 0.1 10*3/uL (ref 0.0–0.1)
Basophils Relative: 1 %
EOS ABS: 0.2 10*3/uL (ref 0.0–0.5)
Eosinophils Relative: 3 %
HEMATOCRIT: 33.1 % — AB (ref 36.0–46.0)
HEMOGLOBIN: 10.5 g/dL — AB (ref 12.0–15.0)
Immature Granulocytes: 0 %
Lymphocytes Relative: 24 %
Lymphs Abs: 1.7 10*3/uL (ref 0.7–4.0)
MCH: 31.3 pg (ref 26.0–34.0)
MCHC: 31.7 g/dL (ref 30.0–36.0)
MCV: 98.5 fL (ref 80.0–100.0)
MONO ABS: 0.6 10*3/uL (ref 0.1–1.0)
Monocytes Relative: 9 %
Neutro Abs: 4.6 10*3/uL (ref 1.7–7.7)
Neutrophils Relative %: 63 %
Platelets: 223 10*3/uL (ref 150–400)
RBC: 3.36 MIL/uL — AB (ref 3.87–5.11)
RDW: 12.4 % (ref 11.5–15.5)
WBC: 7.3 10*3/uL (ref 4.0–10.5)
nRBC: 0 % (ref 0.0–0.2)

## 2018-03-23 NOTE — Telephone Encounter (Signed)
Called and left patient a voicemail message that prescription had been sent to the pharmacy

## 2018-03-24 ENCOUNTER — Other Ambulatory Visit: Payer: Self-pay | Admitting: *Deleted

## 2018-03-24 MED ORDER — TAMOXIFEN CITRATE 20 MG PO TABS: 20.0000 mg | ORAL_TABLET | Freq: Every day | ORAL | 2 refills | Status: DC

## 2018-04-09 NOTE — Progress Notes (Signed)
BH MD/PA/NP OP Progress Note  04/11/2018 1:37 PM Marilyn Haas  MRN:  [id number]  Chief Complaint:  Chief Complaint    Depression; Follow-up     HPI:  Patient presents for follow up appointment for depression.  She states that she continues to feel depressed.  She misses her husband, who deceased from pancreatic cancer.  She states that he was a "born Hotel manager." She talks about memories with him. She had a hard time on holidays as it was different, although she did spent time with her sister.  She feels ashamed of herself, referring to her sister with amputation, who takes care of own. She occasionally contacts with her three children.  She went to church after many years; she agrees to go there more regularly.  She has insomnia.  She has anhedonia.  She feels fatigue.  She has fair concentration.  She denies SI.  She feels anxious at times.  She denies panic attacks.  She takes Xanax at night especially when she has insomnia.   Xanax last filled on 1/8. On hydrocodone  Wt Readings from Last 3 Encounters:  04/11/18 122 lb (55.3 kg)  03/23/18 122 lb 12.8 oz (55.7 kg)  02/16/18 118 lb (53.5 kg)    Visit Diagnosis:    ICD-10-CM   1. MDD (major depressive disorder), recurrent episode, moderate ([place]) F33.1 TSH    Past Psychiatric History: Please see initial evaluation for full details. I have reviewed the history. No updates at this time.     Past Medical History:  Past Medical History:  Diagnosis Date  . Anemia   . Anxiety   . Arthritis   . Breast cancer ([place]) 05/19/2016   Right breast  . Childhood asthma   . Chronic back pain    Scoliosis, stenosis  . Depression   . Diverticulosis   . GERD (gastroesophageal reflux disease)   . Hard of hearing   . Headache   . History of blood transfusion   . History of colon polyps   . History of hiatal hernia   . History of pneumonia   . Insomnia   . Osteopenia   . Osteoporosis   . Personal history of radiation therapy   .  Thickened endometrium 09/29/2017    Past Surgical History:  Procedure Laterality Date  . BREAST LUMPECTOMY WITH RADIOACTIVE SEED AND SENTINEL LYMPH NODE BIOPSY Right 07/31/2016   Procedure: BREAST LUMPECTOMY WITH RADIOACTIVE SEED AND SENTINEL LYMPH NODE BIOPSY, INJECT BLUE DYE RIGHT BREAST;  Surgeon: Fanny Skates, MD;  Location: [facility];  Service: General;  Laterality: Right;  . COLONOSCOPY    . 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 ESOPHAGEAL DILATION N/A 09/24/2017   Procedure: ESOPHAGEAL DILATION;  Surgeon: Rogene Houston, MD;  [facility];  Service: Endoscopy;  Laterality: N/A;  . ESOPHAGOGASTRODUODENOSCOPY N/A 01/26/2014   Procedure: ESOPHAGOGASTRODUODENOSCOPY (EGD);  Surgeon: Rogene Houston, MD;  [facility];  Service: Endoscopy;  Laterality: N/A;  1030  . ESOPHAGOGASTRODUODENOSCOPY (EGD) WITH PROPOFOL N/A 09/24/2017   Procedure: ESOPHAGOGASTRODUODENOSCOPY (EGD) WITH PROPOFOL;  Surgeon: Rogene Houston, MD;  [facility];  Service: Endoscopy;  Laterality: N/A;  11:15  . HIP FRACTURE SURGERY     05/2009 left -Mayer Camel  . MALONEY DILATION N/A 01/26/2014   Procedure: Venia Minks DILATION;  Surgeon: Rogene Houston, MD;  [facility];  Service: Endoscopy;  Laterality: N/A;    Family Psychiatric History: Please see initial evaluation for full details. I have reviewed the history. No updates at this time.     Family History:  Family History  Problem Relation Age of Onset  . Heart failure Mother   . Diabetes Mother   . Hypertension Mother   . Depression Mother   . Other Father        stomach ulcers; abd aneursym  . Hypertension Sister   . Heart failure Sister   . Diabetes Sister   . Depression Sister   . Diabetes Sister        prediabetes  . Cancer Maternal Aunt        breast    Social  History:  Social History   Socioeconomic History  . Marital status: Married    Spouse name: Not on file  . Number of children: 3  . Years of education: Not on file  . Highest education level: Not on file  Occupational History    Employer: RETIRED  Social Needs  . Financial resource strain: Not on file  . Food insecurity:    Worry: Not on file    Inability: Not on file  . Transportation needs:    Medical: Not on file    Non-medical: Not on file  Tobacco Use  . Smoking status: Former Smoker    Packs/day: 0.50    Years: 2.00    Pack years: 1.00    Types: Cigarettes    Last attempt to quit: 01/15/1961    Years since quitting: 57.2  . Smokeless tobacco: Never Used  Substance and Sexual Activity  . Alcohol use: No    Alcohol/week: 0.0 standard drinks  . Drug use: No  . Sexual activity: Not Currently    Birth control/protection: Post-menopausal  Lifestyle  . Physical activity:    Days per week: Not on file    Minutes per session: Not on file  . Stress: Not on file  Relationships  . Social connections:    Talks on phone: Not on file    Gets together: Not on file    Attends religious service: Not on file    Active member of club or organization: Not on file    Attends meetings of clubs or organizations: Not on file    Relationship status: Not on file  Other Topics Concern  . Not on file  Social History Narrative  . Not on file    Allergies:  Allergies  Allergen Reactions  . Avelox [Moxifloxacin Hcl In Nacl] Other (See Comments)    Altered mental status    Metabolic Disorder Labs: Lab Results  Component Value Date   HGBA1C 5.4 02/17/2013   MPG 108 02/17/2013   No results found for: PROLACTIN Lab Results  Component Value Date   CHOL 209 (H) 12/13/2014   TRIG 95 12/13/2014   HDL 114 12/13/2014   CHOLHDL 1.8 12/13/2014   VLDL 19 12/13/2014   LDLCALC 76 12/13/2014   LDLCALC 88 02/17/2013   No results found for: TSH  Therapeutic Level Labs: No results  found for: LITHIUM No results found for: VALPROATE No components found for:  CBMZ  Current Medications: Current Outpatient Medications  Medication Sig Dispense Refill  . ALPRAZolam (XANAX) 0.5 MG tablet Take 0.5 mg by mouth at bedtime as needed for anxiety.    Marland Kitchen atorvastatin (LIPITOR) 10 MG tablet Take 10 mg by mouth daily.    Marland Kitchen  cyanocobalamin (,VITAMIN B-12,) 1000 MCG/ML injection Inject 1,000 mcg into the muscle every 30 (thirty) days.    Marland Kitchen docusate sodium (COLACE) 100 MG capsule Take 2 capsules (200 mg total) by mouth daily. (Patient taking differently: Take 300 mg by mouth daily as needed for mild constipation. ) 10 capsule 0  . famotidine (PEPCID) 20 MG tablet Take 1 tablet (20 mg total) by mouth 2 (two) times daily. 60 tablet 11  . ferrous sulfate 325 (65 FE) MG tablet Take 325 mg by mouth daily with breakfast.    . gabapentin (NEURONTIN) 100 MG capsule Take 1 capsule (100 mg total) by mouth at bedtime. 30 capsule 3  . HYDROcodone-acetaminophen (NORCO) 10-325 MG tablet Take 1 tablet by mouth 3 (three) times daily as needed for moderate pain.     Vladimir Faster Glycol-Propyl Glycol (SYSTANE OP) Place 1 drop into both eyes daily as needed (for dry eyes).    . polyethylene glycol powder (GLYCOLAX/MIRALAX) powder Take 8.5 g by mouth daily. (Patient taking differently: Take 8.5 g by mouth daily as needed for moderate constipation. ) 255 g 0  . tamoxifen (NOLVADEX) 20 MG tablet Take 1 tablet (20 mg total) by mouth daily. 90 tablet 2  . [START ON 04/21/2018] venlafaxine XR (EFFEXOR-XR) 150 MG 24 hr capsule 225 mg daily (150 mg + 75 mg) 90 capsule 0  . [START ON 05/11/2018] venlafaxine XR (EFFEXOR-XR) 75 MG 24 hr capsule 225 mg daily (150 mg + 75 mg) 90 capsule 0  . QUEtiapine (SEROQUEL) 25 MG tablet Take 1 tablet (25 mg total) by mouth at bedtime. 30 tablet 1   No current facility-administered medications for this visit.      Musculoskeletal: Strength & Muscle Tone: within normal limits Gait &  Station: normal Patient leans: N/A  Psychiatric Specialty Exam: Review of Systems  Psychiatric/Behavioral: Positive for depression. Negative for hallucinations, memory loss, substance abuse and suicidal ideas. The patient is nervous/anxious and has insomnia.   All other systems reviewed and are negative.   Blood pressure 132/84, pulse 96, height 5' 5.5" (1.664 m), weight 122 lb (55.3 kg), SpO2 98 %.Body mass index is 19.99 kg/m.  General Appearance: Fairly Groomed  Eye Contact:  Good  Speech:  Clear and Coherent  Volume:  Normal  Mood:  Depressed  Affect:  Appropriate, Congruent and Tearful  Thought Process:  Coherent  Orientation:  Full (Time, Place, and Person)  Thought Content: Logical   Suicidal Thoughts:  No  Homicidal Thoughts:  No  Memory:  Immediate;   Good  Judgement:  Good  Insight:  Good  Psychomotor Activity:  Normal  Concentration:  Concentration: Good and Attention Span: Good  Recall:  Good  Fund of Knowledge: Good  Language: Good  Akathisia:  No  Handed:  Right  AIMS (if indicated): not done  Assets:  Communication Skills Desire for Improvement  ADL's:  Intact  Cognition: WNL  Sleep:  Poor   Screenings: PHQ2-9     Office Visit from 09/23/2017 in [facility] Follow Up  from 12/08/2016 in [facility] from 08/13/2016 in [facility]  PHQ-2 Total Score  6  1  1   PHQ-9 Total Score  24  -  -       Assessment and Plan:  [name] is a 78 y.o. year old female with a history of depression,  esophageal dysphasia, weight loss, history of breast cancer s/p lumpectomy, radiation,  currently on anastrozole , who presents for follow up appointment for MDD (major depressive disorder), recurrent episode, moderate ([place]) - Plan: TSH  # MDD, moderate, recurrent without psychotic features Patient continues to report depressive symptoms after up titration of Effexor.  Psychosocial  stressors including grief of loss of her husband from pancreatic cancer, her sister with medical condition, loss of her sister-in-law and her sister's children from murder.  Will start quetiapine as adjunctive treatment for depression, and also to target insomnia and appetite loss.  Discussed potential metabolic side effect.  Discussed behavioral activation.  Although she will greatly benefit from CBT, she would like to hold this option at this time.  Will continue to monitor.   Plan 1. Continue Effexor 225 mg daily  2. Start quetiapine 25 mg at night  3. Return to clinic in two months for 30 mins 4. Check TSH  (she is not on xanax anymore per patient report)  The patient demonstrates the following risk factors for suicide: Chronic risk factors for suicide include:psychiatric disorder ofdepression. Acute risk factorsfor suicide include: unemployment, Theme park manager and loss (financial, interpersonal, professional). Protective factorsfor this patient include: positive social support, coping skills and hope for the future. Considering these factors, the overall suicide risk at this point appears to below. Patientisappropriate for outpatient follow up.  The duration of this appointment visit was 25 minutes of face-to-face time with the patient.  Greater than 50% of this time was spent in counseling, explanation of  diagnosis, planning of further management, and coordination of care. Norman Clay, MD 04/11/2018, 1:37 PM

## 2018-04-11 ENCOUNTER — Encounter (HOSPITAL_COMMUNITY): Payer: Self-pay | Admitting: Psychiatry

## 2018-04-11 ENCOUNTER — Ambulatory Visit (INDEPENDENT_AMBULATORY_CARE_PROVIDER_SITE_OTHER): Payer: Medicare Other | Admitting: Psychiatry

## 2018-04-11 VITALS — BP 132/84 | HR 96 | Ht 65.5 in | Wt 122.0 lb

## 2018-04-11 DIAGNOSIS — F331 Major depressive disorder, recurrent, moderate: Secondary | ICD-10-CM | POA: Diagnosis not present

## 2018-04-11 MED ORDER — VENLAFAXINE HCL ER 150 MG PO CP24: ORAL_CAPSULE | ORAL | 0 refills | Status: DC

## 2018-04-11 MED ORDER — QUETIAPINE FUMARATE 25 MG PO TABS: 25.0000 mg | ORAL_TABLET | Freq: Every day | ORAL | 1 refills | Status: DC

## 2018-04-11 MED ORDER — VENLAFAXINE HCL ER 75 MG PO CP24: ORAL_CAPSULE | ORAL | 0 refills | Status: DC

## 2018-04-11 NOTE — Patient Instructions (Signed)
1. Cotninue Effexor 225 mg daily  2. Start quetiapine 25 mg at night  3. Return to clinic in two months for 30 mins

## 2018-04-12 LAB — TSH: TSH: 1.97 mIU/L (ref 0.40–4.50)

## 2018-04-19 ENCOUNTER — Ambulatory Visit (INDEPENDENT_AMBULATORY_CARE_PROVIDER_SITE_OTHER): Payer: Medicare Other | Admitting: Obstetrics and Gynecology

## 2018-04-19 ENCOUNTER — Encounter: Payer: Self-pay | Admitting: Obstetrics and Gynecology

## 2018-04-19 VITALS — BP 148/83 | HR 78 | Ht 65.0 in | Wt 124.0 lb

## 2018-04-19 DIAGNOSIS — M810 Age-related osteoporosis without current pathological fracture: Secondary | ICD-10-CM | POA: Diagnosis not present

## 2018-04-19 DIAGNOSIS — C50919 Malignant neoplasm of unspecified site of unspecified female breast: Secondary | ICD-10-CM | POA: Diagnosis not present

## 2018-04-19 DIAGNOSIS — Z6821 Body mass index (BMI) 21.0-21.9, adult: Secondary | ICD-10-CM | POA: Diagnosis not present

## 2018-04-19 DIAGNOSIS — E2839 Other primary ovarian failure: Secondary | ICD-10-CM | POA: Diagnosis not present

## 2018-04-19 DIAGNOSIS — R5383 Other fatigue: Secondary | ICD-10-CM | POA: Diagnosis not present

## 2018-04-19 DIAGNOSIS — R6889 Other general symptoms and signs: Secondary | ICD-10-CM | POA: Diagnosis not present

## 2018-04-19 DIAGNOSIS — D649 Anemia, unspecified: Secondary | ICD-10-CM | POA: Diagnosis not present

## 2018-04-19 DIAGNOSIS — R9389 Abnormal findings on diagnostic imaging of other specified body structures: Secondary | ICD-10-CM | POA: Diagnosis not present

## 2018-04-19 DIAGNOSIS — G894 Chronic pain syndrome: Secondary | ICD-10-CM | POA: Diagnosis not present

## 2018-04-19 DIAGNOSIS — E782 Mixed hyperlipidemia: Secondary | ICD-10-CM | POA: Diagnosis not present

## 2018-04-19 DIAGNOSIS — E538 Deficiency of other specified B group vitamins: Secondary | ICD-10-CM | POA: Diagnosis not present

## 2018-04-19 NOTE — Progress Notes (Signed)
[facility] Visit  04/19/2018           Patient name: Marilyn Haas MRN [id number]  Date of birth: 01/14/1941  CC & HPI:  [name] is a 78 y.o. female presenting today for follow up regarding the use of Tamoxifen and thickened endometrial lining.  Last measured in 2018 at 10 mm she is accompanied by her son, [name] today. She is being treated with Medical Oncologist, Dr. Jana Hakim for breast cancer and being treated with Tamoxifen. Her son would like an [place] to ensure that the patient isn't at risk for Uterine cancer. For her endoscopy, she didn't have to have any medical clearance prior.  To the procedure.  After discussion of treatment options and reviewing the fact that we could not perform an endometrial biopsy in the office due to cervical stenosis at her prior visit here we are going to proceed directly toward hysteroscopy with D&C and endometrial biopsy I have offered to attempt local anesthesia and repeat biopsy in the office, and patient and her son choose against that option ROS:  ROS All systems are negative except as noted in the HPI and PMH.   Pertinent History Reviewed:   Reviewed: Significant for right breast cancer Medical         Past Medical History:  Diagnosis Date  . Anemia   . Anxiety   . Arthritis   . Breast cancer ([place]) 05/19/2016   Right breast  . Childhood asthma   . Chronic back pain    Scoliosis, stenosis  . Depression   . Diverticulosis   . GERD (gastroesophageal reflux disease)   . Hard of hearing   . Headache   . History of blood transfusion   . History of colon polyps   . History of hiatal hernia   . History of pneumonia   . Insomnia   . Osteopenia   . Osteoporosis   . Personal history of radiation therapy   . Thickened endometrium 09/29/2017                              Surgical Hx:    Past Surgical History:  Procedure Laterality Date  . BREAST LUMPECTOMY WITH RADIOACTIVE SEED AND SENTINEL LYMPH NODE BIOPSY Right 07/31/2016    Procedure: BREAST LUMPECTOMY WITH RADIOACTIVE SEED AND SENTINEL LYMPH NODE BIOPSY, INJECT BLUE DYE RIGHT BREAST;  Surgeon: Fanny Skates, MD;  Location: [facility];  Service: General;  Laterality: Right;  . COLONOSCOPY    . 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 ESOPHAGEAL DILATION N/A 09/24/2017   Procedure: ESOPHAGEAL DILATION;  Surgeon: Rogene Houston, MD;  [facility];  Service: Endoscopy;  Laterality: N/A;  . ESOPHAGOGASTRODUODENOSCOPY N/A 01/26/2014   Procedure: ESOPHAGOGASTRODUODENOSCOPY (EGD);  Surgeon: Rogene Houston, MD;  [facility];  Service: Endoscopy;  Laterality: N/A;  1030  . ESOPHAGOGASTRODUODENOSCOPY (EGD) WITH PROPOFOL N/A 09/24/2017   Procedure: ESOPHAGOGASTRODUODENOSCOPY (EGD) WITH PROPOFOL;  Surgeon: Rogene Houston, MD;  [facility];  Service: Endoscopy;  Laterality: N/A;  11:15  . HIP FRACTURE SURGERY     05/2009 left -Mayer Camel  . MALONEY DILATION N/A 01/26/2014   Procedure: Venia Minks DILATION;  Surgeon: Rogene Houston, MD;  [facility];  Service: Endoscopy;  Laterality: N/A;   Medications: Reviewed & Updated - see associated section                       Current Outpatient Medications:  .  ALPRAZolam (XANAX) 0.5 MG tablet, Take 0.5 mg by mouth at bedtime as needed for anxiety., Disp: , Rfl:  .  atorvastatin (LIPITOR) 10 MG tablet, Take 10 mg by mouth daily., Disp: , Rfl:  .  cyanocobalamin (,VITAMIN B-12,) 1000 MCG/ML injection, Inject 1,000 mcg into the muscle every 30 (thirty) days., Disp: , Rfl:  .  docusate sodium (COLACE) 100 MG capsule, Take 2 capsules (200 mg total) by mouth daily. (Patient taking differently: Take 300 mg by mouth daily as needed for mild constipation. ), Disp: 10 capsule, Rfl: 0 .  famotidine (PEPCID) 20 MG tablet, Take 1 tablet (20 mg total) by mouth 2  (two) times daily., Disp: 60 tablet, Rfl: 11 .  ferrous sulfate 325 (65 FE) MG tablet, Take 325 mg by mouth daily with breakfast., Disp: , Rfl:  .  gabapentin (NEURONTIN) 100 MG capsule, Take 1 capsule (100 mg total) by mouth at bedtime., Disp: 30 capsule, Rfl: 3 .  HYDROcodone-acetaminophen (NORCO) 10-325 MG tablet, Take 1 tablet by mouth 3 (three) times daily as needed for moderate pain. , Disp: , Rfl:  .  Polyethyl Glycol-Propyl Glycol (SYSTANE OP), Place 1 drop into both eyes daily as needed (for dry eyes)., Disp: , Rfl:  .  polyethylene glycol powder (GLYCOLAX/MIRALAX) powder, Take 8.5 g by mouth daily. (Patient taking differently: Take 8.5 g by mouth daily as needed for moderate constipation. ), Disp: 255 g, Rfl: 0 .  tamoxifen (NOLVADEX) 20 MG tablet, Take 1 tablet (20 mg total) by mouth daily., Disp: 90 tablet, Rfl: 2 .  [START ON 04/21/2018] venlafaxine XR (EFFEXOR-XR) 150 MG 24 hr capsule, 225 mg daily (150 mg + 75 mg), Disp: 90 capsule, Rfl: 0 .  [START ON 05/11/2018] venlafaxine XR (EFFEXOR-XR) 75 MG 24 hr capsule, 225 mg daily (150 mg + 75 mg), Disp: 90 capsule, Rfl: 0 .  QUEtiapine (SEROQUEL) 25 MG tablet, Take 1 tablet (25 mg total) by mouth at bedtime. (Patient not taking: Reported on 04/19/2018), Disp: 30 tablet, Rfl: 1   Social History: Reviewed -  reports that she quit smoking about 57 years ago. Her smoking use included cigarettes. She has a 1.00 pack-year smoking history. She has never used smokeless tobacco.  Objective Findings:  Vitals: Blood pressure (!) 148/83, pulse 78, height 5\' 5"  (1.651 m), weight 124 lb (56.2 kg).  PHYSICAL EXAMINATION General appearance - alert, well appearing, and in no distress and oriented to person, place, and time Mental status - alert, oriented to person, place, and time, normal mood, behavior, speech, dress, motor activity, and thought processes  Discussion: 1. Discussed with pt risks and benefits of tamoxifen use and 10 mm thickened endometrium  lining.   At end of discussion, pt had opportunity to ask questions and has no further questions at this time.   Specific discussion of tamoxifen use and 10 mm thickened endometrium lining as noted above. Greater than 50% was spent in counseling and coordination of care with the patient.   Total time greater than: 15 minutes.    Assessment & Plan:   A:  1.  endometrial thickening 2. Breast cancer on tamoxifen  P:  1.  Will have the patient scheduled for an hysteroscopy and D&C in the near future 2. If the  endometrial lining is without atypical features, we will follow up for evaluation of thickness.     By signing my name below, I, Soijett Blue, attest that this documentation has been prepared under the direction and in the presence of Jonnie Kind, MD. Electronically Signed: Soijett Blue, Presenter, broadcasting. 04/19/18. 4:19 PM.  I personally performed the services described in this documentation, which was SCRIBED in my presence. The recorded information has been reviewed and considered accurate. It has been edited as necessary during review. Jonnie Kind, MD

## 2018-04-25 NOTE — Progress Notes (Signed)
BH MD/PA/NP OP Progress Note  04/29/2018 12:06 PM Marilyn Haas  MRN:  [id number]  Chief Complaint:  Chief Complaint    Depression; Follow-up     HPI:  Patient presents for follow-up appointment for depression.  She states that she continues to feel "on limbo."  She talks about her sister, who has issues with ADL.  Although she wants to help her sister more, she has not been able to do so due to depression and her physical limitation.  She feels ashamed of herself of how she is doing, compared to her sister who tries to do things. Discussed self compassion. She tried only one dose of quetiapine as she was concerned of potential interaction with her antihistamine she takes for sinus symptoms. She is advised to try both, and informed of potential drowsiness.  She wonders if she was doing better on duloxetine (however on initial encounter, she reports that duloxetine had limited benefit for the patient when she used to be on 90 mg).  She feels fatigue.  She has fair concentration.  She has occasional insomnia.  Although she has decreased appetite, she tries to make her eat.  She denies SI. She feels anxious, tense at times. She denies panic attacks.   Xanax filled on 2/7 hydrocodone  Wt Readings from Last 3 Encounters:  04/29/18 136 lb (61.7 kg)  04/19/18 124 lb (56.2 kg)  04/11/18 122 lb (55.3 kg)    Visit Diagnosis:    ICD-10-CM   1. MDD (major depressive disorder), recurrent episode, moderate ([place]) F33.1     Past Psychiatric History: Please see initial evaluation for full details. I have reviewed the history. No updates at this time.     Past Medical History:  Past Medical History:  Diagnosis Date  . Anemia   . Anxiety   . Arthritis   . Breast cancer ([place]) 05/19/2016   Right breast  . Childhood asthma   . Chronic back pain    Scoliosis, stenosis  . Depression   . Diverticulosis   . GERD (gastroesophageal reflux disease)   . Hard of hearing   . Headache   . History of  blood transfusion   . History of colon polyps   . History of hiatal hernia   . History of pneumonia   . Insomnia   . Osteopenia   . Osteoporosis   . Personal history of radiation therapy   . Thickened endometrium 09/29/2017    Past Surgical History:  Procedure Laterality Date  . BREAST LUMPECTOMY WITH RADIOACTIVE SEED AND SENTINEL LYMPH NODE BIOPSY Right 07/31/2016   Procedure: BREAST LUMPECTOMY WITH RADIOACTIVE SEED AND SENTINEL LYMPH NODE BIOPSY, INJECT BLUE DYE RIGHT BREAST;  Surgeon: Fanny Skates, MD;  Location: [facility];  Service: General;  Laterality: Right;  . COLONOSCOPY    . 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 ESOPHAGEAL DILATION N/A 09/24/2017   Procedure: ESOPHAGEAL DILATION;  Surgeon: Rogene Houston, MD;  [facility];  Service: Endoscopy;  Laterality: N/A;  . ESOPHAGOGASTRODUODENOSCOPY N/A 01/26/2014   Procedure: ESOPHAGOGASTRODUODENOSCOPY (EGD);  Surgeon: Rogene Houston, MD;  [facility];  Service: Endoscopy;  Laterality: N/A;  1030  . ESOPHAGOGASTRODUODENOSCOPY (EGD) WITH PROPOFOL N/A 09/24/2017   Procedure: ESOPHAGOGASTRODUODENOSCOPY (EGD) WITH PROPOFOL;  Surgeon: Rogene Houston, MD;  [facility];  Service: Endoscopy;  Laterality: N/A;  11:15  . HIP FRACTURE SURGERY     05/2009 left -[facility]  . MALONEY DILATION N/A 01/26/2014   Procedure: Venia Minks DILATION;  Surgeon: Rogene Houston, MD;  [facility];  Service: Endoscopy;  Laterality: N/A;    Family Psychiatric History: Please see initial evaluation for full details. I have reviewed the history. No updates at this time.     Family History:  Family History  Problem Relation Age of Onset  . Heart failure Mother   . Diabetes Mother   . Hypertension Mother   . Depression Mother   . Other Father        stomach ulcers; abd  aneursym  . Hypertension Sister   . Heart failure Sister   . Diabetes Sister   . Depression Sister   . Diabetes Sister        prediabetes  . Cancer Maternal Aunt        breast    Social History:  Social History   Socioeconomic History  . Marital status: Married    Spouse name: Not on file  . Number of children: 3  . Years of education: Not on file  . Highest education level: Not on file  Occupational History    Employer: RETIRED  Social Needs  . Financial resource strain: Not on file  . Food insecurity:    Worry: Not on file    Inability: Not on file  . Transportation needs:    Medical: Not on file    Non-medical: Not on file  Tobacco Use  . Smoking status: Former Smoker    Packs/day: 0.50    Years: 2.00    Pack years: 1.00    Types: Cigarettes    Last attempt to quit: 01/15/1961    Years since quitting: 57.3  . Smokeless tobacco: Never Used  Substance and Sexual Activity  . Alcohol use: No    Alcohol/week: 0.0 standard drinks  . Drug use: No  . Sexual activity: Not Currently    Birth control/protection: Post-menopausal  Lifestyle  . Physical activity:    Days per week: Not on file    Minutes per session: Not on file  . Stress: Not on file  Relationships  . Social connections:    Talks on phone: Not on file    Gets together: Not on file    Attends religious service: Not on file    Active member of club or organization: Not on file    Attends meetings of clubs or organizations: Not on file    Relationship status: Not on file  Other Topics Concern  . Not on file  Social History Narrative  . Not on file    Allergies:  Allergies  Allergen Reactions  . Avelox [Moxifloxacin Hcl In Nacl] Other (See Comments)    Altered mental status    Metabolic Disorder Labs: Lab Results  Component Value Date   HGBA1C 5.4 02/17/2013   MPG 108 02/17/2013   No results found for: PROLACTIN Lab Results  Component Value Date   CHOL 209 (H) 12/13/2014   TRIG 95  12/13/2014   HDL 114 12/13/2014   CHOLHDL 1.8 12/13/2014   VLDL 19 12/13/2014   LDLCALC 76 12/13/2014   LDLCALC 88 02/17/2013   Lab Results  Component Value Date   TSH 1.97 04/11/2018    Therapeutic Level Labs: No results found for: LITHIUM No results found for: VALPROATE No components found for:  CBMZ  Current Medications: Current  Outpatient Medications  Medication Sig Dispense Refill  . ALPRAZolam (XANAX) 0.5 MG tablet Take 0.5 mg by mouth at bedtime as needed for anxiety.    Marland Kitchen atorvastatin (LIPITOR) 10 MG tablet Take 10 mg by mouth daily.    . cyanocobalamin (,VITAMIN B-12,) 1000 MCG/ML injection Inject 1,000 mcg into the muscle every 30 (thirty) days.    Marland Kitchen docusate sodium (COLACE) 100 MG capsule Take 2 capsules (200 mg total) by mouth daily. (Patient taking differently: Take 300 mg by mouth daily as needed for mild constipation. ) 10 capsule 0  . famotidine (PEPCID) 20 MG tablet Take 1 tablet (20 mg total) by mouth 2 (two) times daily. 60 tablet 11  . ferrous sulfate 325 (65 FE) MG tablet Take 325 mg by mouth daily with breakfast.    . gabapentin (NEURONTIN) 100 MG capsule Take 1 capsule (100 mg total) by mouth at bedtime. 30 capsule 3  . HYDROcodone-acetaminophen (NORCO) 10-325 MG tablet Take 1 tablet by mouth 3 (three) times daily as needed for moderate pain.     Vladimir Faster Glycol-Propyl Glycol (SYSTANE OP) Place 1 drop into both eyes daily as needed (for dry eyes).    . polyethylene glycol powder (GLYCOLAX/MIRALAX) powder Take 8.5 g by mouth daily. (Patient taking differently: Take 8.5 g by mouth daily as needed for moderate constipation. ) 255 g 0  . QUEtiapine (SEROQUEL) 25 MG tablet Take 1 tablet (25 mg total) by mouth at bedtime. 30 tablet 1  . tamoxifen (NOLVADEX) 20 MG tablet Take 1 tablet (20 mg total) by mouth daily. 90 tablet 2  . venlafaxine XR (EFFEXOR-XR) 150 MG 24 hr capsule 225 mg daily (150 mg + 75 mg) 90 capsule 0  . [START ON 05/11/2018] venlafaxine XR  (EFFEXOR-XR) 75 MG 24 hr capsule 225 mg daily (150 mg + 75 mg) 90 capsule 0   No current facility-administered medications for this visit.      Musculoskeletal: Strength & Muscle Tone: within normal limits Gait & Station: normal Patient leans: N/A  Psychiatric Specialty Exam: Review of Systems  Psychiatric/Behavioral: Positive for depression. Negative for hallucinations, memory loss, substance abuse and suicidal ideas. The patient is nervous/anxious and has insomnia.   All other systems reviewed and are negative.   Blood pressure 128/76, pulse 90, height 5\' 5"  (1.651 m), weight 136 lb (61.7 kg), SpO2 97 %.Body mass index is 22.63 kg/m.  General Appearance: Fairly Groomed  Eye Contact:  Good  Speech:  Clear and Coherent  Volume:  Normal  Mood:  Depressed  Affect:  Appropriate, Congruent and down at itmes, reactive  Thought Process:  Coherent  Orientation:  Full (Time, Place, and Person)  Thought Content: Logical   Suicidal Thoughts:  No  Homicidal Thoughts:  No  Memory:  Immediate;   Good  Judgement:  Good  Insight:  Fair  Psychomotor Activity:  Normal  Concentration:  Concentration: Good and Attention Span: Good  Recall:  Good  Fund of Knowledge: Good  Language: Good  Akathisia:  No  Handed:  Right  AIMS (if indicated): not done  Assets:  Communication Skills Desire for Improvement  ADL's:  Intact  Cognition: WNL  Sleep:  Good   Screenings: PHQ2-9     Office Visit from 09/23/2017 in [facility] Follow Up  from 12/08/2016 in [facility] from 08/13/2016 in [facility]  PHQ-2 Total Score  6  1  1   PHQ-9 Total  Score  24  -  -       Assessment and Plan:  Marilyn Haas is a 78 y.o. year old female with a history of depression, esophageal dysphasia, weight loss, history of breast cancer s/p lumpectomy, radiation, currently on anastrozole , who presents for follow up appointment  for MDD (major depressive disorder), recurrent episode, moderate ([place])  # MDD, moderate, recurrent without psychotic features Patient continues to report depressive symptoms.  Psychosocial stressors including grief of loss of her husband from pancreatic cancer, and her sister with medical condition, loss of her sister-in-law and her sister's children from murder.  She has taken only 1 dose of quetiapine; she is advised to take it every day to optimize the treatment.  Discussed potential metabolic side effect and drowsiness.  He is advised to contact the office if she experiences any side effect.  Discussed behavioral activation.  Although she will greatly benefit from therapy, she is not interested in this option at this time.  Will continue to monitor.   Plan 1. Continue Effexor 225 mg daily  2. Continue quetiapine 25 mg at night  3. Return to clinic in two months for 30 mins 4 TSH reviewed; wnl (she is not on xanax anymore per patient report)  The patient demonstrates the following risk factors for suicide: Chronic risk factors for suicide include:psychiatric disorder ofdepression. Acute risk factorsfor suicide include: unemployment, Theme park manager and loss (financial, interpersonal, professional). Protective factorsfor this patient include: positive social support, coping skills and hope for the future. Considering these factors, the overall suicide risk at this point appears to below. Patientisappropriate for outpatient follow up.  The duration of this appointment visit was 25 minutes of face-to-face time with the patient.  Greater than 50% of this time was spent in counseling, explanation of  diagnosis, planning of further management, and coordination of care.  Norman Clay, MD 04/29/2018, 12:06 PM

## 2018-04-29 ENCOUNTER — Ambulatory Visit (INDEPENDENT_AMBULATORY_CARE_PROVIDER_SITE_OTHER): Payer: Medicare Other | Admitting: Psychiatry

## 2018-04-29 ENCOUNTER — Encounter (HOSPITAL_COMMUNITY): Payer: Self-pay | Admitting: Psychiatry

## 2018-04-29 VITALS — BP 128/76 | HR 90 | Ht 65.0 in | Wt 136.0 lb

## 2018-04-29 DIAGNOSIS — F331 Major depressive disorder, recurrent, moderate: Secondary | ICD-10-CM

## 2018-04-29 NOTE — Patient Instructions (Addendum)
1. Continue Effexor 225 mg daily  2. Continue quetiapine 25 mg at night  3. Return to clinic in two months for 15 mins

## 2018-05-05 ENCOUNTER — Other Ambulatory Visit (HOSPITAL_COMMUNITY): Payer: Self-pay

## 2018-05-10 NOTE — Patient Instructions (Signed)
[name]  05/10/2018     @PREFPERIOPPHARMACY @   Your procedure is scheduled on  05/24/2018 .  Report to [name] at  [email or web address].  Call this number if you have problems the morning of surgery:  [phone]   Remember:  Do not eat or drink after midnight.                       Take these medicines the morning of surgery with A SIP OF WATER  Pepcid, gabapebtin, hydrocodone ( if needed), effexor.    Do not wear jewelry, make-up or nail polish.  Do not wear lotions, powders, or perfumes, or deodorant.  Do not shave 48 hours prior to surgery.  Men may shave face and neck.  Do not bring valuables to the hospital.  [facility] is not responsible for any belongings or valuables.  Contacts, dentures or bridgework may not be worn into surgery.  Leave your suitcase in the car.  After surgery it may be brought to your room.  For patients admitted to the hospital, discharge time will be determined by your treatment team.  Patients discharged the day of surgery will not be allowed to drive home.   Name and phone number of your driver:   family Special instructions:  None  Please read over the following fact sheets that you were given. Anesthesia Post-op Instructions and Care and Recovery After Surgery       Dilation and Curettage or Vacuum Curettage, Care After These instructions give you information about caring for yourself after your procedure. Your doctor may also give you more specific instructions. Call your doctor if you have any problems or questions after your procedure. Follow these instructions at home: Activity  Do not drive or use heavy machinery while taking prescription pain medicine.  For 24 hours after your procedure, avoid driving.  Take short walks often, followed by rest periods. Ask your doctor what activities are safe for you. After one or two days, you may be able to return to your normal activities.  Do not lift anything that is  heavier than 10 lb (4.5 kg) until your doctor approves.  For at least 2 weeks, or as long as told by your doctor: ? Do not douche. ? Do not use tampons. ? Do not have sex. General instructions   Take over-the-counter and prescription medicines only as told by your doctor. This is very important if you take blood thinning medicine.  Do not take baths, swim, or use a hot tub until your doctor approves. Take showers instead of baths.  Wear compression stockings as told by your doctor.  It is up to you to get the results of your procedure. Ask your doctor when your results will be ready.  Keep all follow-up visits as told by your doctor. This is important. Contact a doctor if:  You have very bad cramps that get worse or do not get better with medicine.  You have very bad pain in your belly (abdomen).  You cannot drink fluids without throwing up (vomiting).  You get pain in a different part of the area between your belly and thighs (pelvis).  You have bad-smelling discharge from your vagina.  You have a rash. Get help right away if:  You are bleeding a lot from your vagina. A lot of bleeding means soaking more than one sanitary pad in an hour,  for 2 hours in a row.  You have clumps of blood (blood clots) coming from your vagina.  You have a fever or chills.  Your belly feels very tender or hard.  You have chest pain.  You have trouble breathing.  You cough up blood.  You feel dizzy.  You feel light-headed.  You pass out (faint).  You have pain in your neck or shoulder area. Summary  Take short walks often, followed by rest periods. Ask your doctor what activities are safe for you. After one or two days, you may be able to return to your normal activities.  Do not lift anything that is heavier than 10 lb (4.5 kg) until your doctor approves.  Do not take baths, swim, or use a hot tub until your doctor approves. Take showers instead of baths.  Contact your doctor  if you have any symptoms of infection, like bad-smelling discharge from your vagina. This information is not intended to replace advice given to you by your health care provider. Make sure you discuss any questions you have with your health care provider. Document Released: 12/10/2007 Document Revised: 11/18/2015 Document Reviewed: 11/18/2015 Elsevier Interactive Patient Education  2019 [place].  Hysteroscopy, Care After This sheet gives you information about how to care for yourself after your procedure. Your health care provider may also give you more specific instructions. If you have problems or questions, contact your health care provider. What can I expect after the procedure? After the procedure, it is common to have:  Cramping.  Bleeding. This can vary from light spotting to menstrual-like bleeding. Follow these instructions at home: Activity  Rest for 1-2 days after the procedure.  Do not douche, use tampons, or have sex for 2 weeks after the procedure, or until your health care provider approves.  Do not drive for 24 hours after the procedure, or for as long as told by your health care provider.  Do not drive, use heavy machinery, or drink alcohol while taking prescription pain medicines. Medicines   Take over-the-counter and prescription medicines only as told by your health care provider.  Do not take aspirin during recovery. It can increase the risk of bleeding. General instructions  Do not take baths, swim, or use a hot tub until your health care provider approves. Take showers instead of baths for 2 weeks, or for as long as told by your health care provider.  To prevent or treat constipation while you are taking prescription pain medicine, your health care provider may recommend that you: ? Drink enough fluid to keep your urine clear or pale yellow. ? Take over-the-counter or prescription medicines. ? Eat foods that are high in fiber, such as fresh fruits and  vegetables, whole grains, and beans. ? Limit foods that are high in fat and processed sugars, such as fried and sweet foods.  Keep all follow-up visits as told by your health care provider. This is important. Contact a health care provider if:  You feel dizzy or lightheaded.  You feel nauseous.  You have abnormal vaginal discharge.  You have a rash.  You have pain that does not get better with medicine.  You have chills. Get help right away if:  You have bleeding that is heavier than a normal menstrual period.  You have a fever.  You have pain or cramps that get worse.  You develop new abdominal pain.  You faint.  You have pain in your shoulders.  You have shortness of breath. Summary  After the procedure, you may have cramping and some vaginal bleeding.  Do not douche, use tampons, or have sex for 2 weeks after the procedure, or until your health care provider approves.  Do not take baths, swim, or use a hot tub until your health care provider approves. Take showers instead of baths for 2 weeks, or for as long as told by your health care provider.  Report any unusual symptoms to your health care provider.  Keep all follow-up visits as told by your health care provider. This is important. This information is not intended to replace advice given to you by your health care provider. Make sure you discuss any questions you have with your health care provider. Document Released: 12/21/2012 Document Revised: 03/31/2016 Document Reviewed: 03/31/2016 Elsevier Interactive Patient Education  2019 [place] Anesthesia, Adult, Care After This sheet gives you information about how to care for yourself after your procedure. Your health care provider may also give you more specific instructions. If you have problems or questions, contact your health care provider. What can I expect after the procedure? After the procedure, the following side effects are common:  Pain  or discomfort at the IV site.  Nausea.  Vomiting.  Sore throat.  Trouble concentrating.  Feeling cold or chills.  Weak or tired.  Sleepiness and fatigue.  Soreness and body aches. These side effects can affect parts of the body that were not involved in surgery. Follow these instructions at home:  For at least 24 hours after the procedure:  Have a responsible adult stay with you. It is important to have someone help care for you until you are awake and alert.  Rest as needed.  Do not: ? Participate in activities in which you could fall or become injured. ? Drive. ? Use heavy machinery. ? Drink alcohol. ? Take sleeping pills or medicines that cause drowsiness. ? Make important decisions or sign legal documents. ? Take care of children on your own. Eating and drinking  Follow any instructions from your health care provider about eating or drinking restrictions.  When you feel hungry, start by eating small amounts of foods that are soft and easy to digest (bland), such as toast. Gradually return to your regular diet.  Drink enough fluid to keep your urine pale yellow.  If you vomit, rehydrate by drinking water, juice, or clear broth. General instructions  If you have sleep apnea, surgery and certain medicines can increase your risk for breathing problems. Follow instructions from your health care provider about wearing your sleep device: ? Anytime you are sleeping, including during daytime naps. ? While taking prescription pain medicines, sleeping medicines, or medicines that make you drowsy.  Return to your normal activities as told by your health care provider. Ask your health care provider what activities are safe for you.  Take over-the-counter and prescription medicines only as told by your health care provider.  If you smoke, do not smoke without supervision.  Keep all follow-up visits as told by your health care provider. This is important. Contact a health  care provider if:  You have nausea or vomiting that does not get better with medicine.  You cannot eat or drink without vomiting.  You have pain that does not get better with medicine.  You are unable to pass urine.  You develop a skin rash.  You have a fever.  You have redness around your IV site that gets worse. Get help right away if:  You have  difficulty breathing.  You have chest pain.  You have blood in your urine or stool, or you vomit blood. Summary  After the procedure, it is common to have a sore throat or nausea. It is also common to feel tired.  Have a responsible adult stay with you for the first 24 hours after general anesthesia. It is important to have someone help care for you until you are awake and alert.  When you feel hungry, start by eating small amounts of foods that are soft and easy to digest (bland), such as toast. Gradually return to your regular diet.  Drink enough fluid to keep your urine pale yellow.  Return to your normal activities as told by your health care provider. Ask your health care provider what activities are safe for you. This information is not intended to replace advice given to you by your health care provider. Make sure you discuss any questions you have with your health care provider. Document Released: 06/08/2000 Document Revised: 10/16/2016 Document Reviewed: 10/16/2016 Elsevier Interactive Patient Education  2019 [employer].

## 2018-05-16 ENCOUNTER — Other Ambulatory Visit: Payer: Self-pay | Admitting: Obstetrics and Gynecology

## 2018-05-16 NOTE — Progress Notes (Signed)
[facility] Visit  04/19/2018           Patient name: Marilyn Haas    MRN [id number]  Date of birth: 09/29/1940  CC & HPI:  Marilyn Haas is a 78 y.o. female presenting today for follow up regarding the use of Tamoxifen and thickened endometrial lining.  Last measured in 2018 at 10 mm she is accompanied by her son, Marilyn Haas today. She is being treated with Medical Oncologist, Dr. Jana Hakim for breast cancer and being treated with Tamoxifen. Her son would like an [place] to ensure that the patient isn't at risk for Uterine cancer. For her endoscopy, she didn't have to have any medical clearance prior.  To the procedure.  After discussion of treatment options and reviewing the fact that we could not perform an endometrial biopsy in the office due to cervical stenosis at her prior visit here we are going to proceed directly toward hysteroscopy with D&C and endometrial biopsy I have offered to attempt local anesthesia and repeat biopsy in the office, and patient and her son choose against that option ROS:  ROS All systems are negative except as noted in the HPI and PMH.   Pertinent History Reviewed:   Reviewed: Significant for right breast cancer Medical             Past Medical History:  Diagnosis Date  . Anemia   . Anxiety   . Arthritis   . Breast cancer ([place]) 05/19/2016   Right breast  . Childhood asthma   . Chronic back pain    Scoliosis, stenosis  . Depression   . Diverticulosis   . GERD (gastroesophageal reflux disease)   . Hard of hearing   . Headache   . History of blood transfusion   . History of colon polyps   . History of hiatal hernia   . History of pneumonia   . Insomnia   . Osteopenia   . Osteoporosis   . Personal history of radiation therapy   . Thickened endometrium 09/29/2017                              Surgical Hx:         Past Surgical History:  Procedure Laterality Date  . BREAST LUMPECTOMY WITH RADIOACTIVE SEED AND SENTINEL  LYMPH NODE BIOPSY Right 07/31/2016   Procedure: BREAST LUMPECTOMY WITH RADIOACTIVE SEED AND SENTINEL LYMPH NODE BIOPSY, INJECT BLUE DYE RIGHT BREAST;  Surgeon: Fanny Skates, MD;  Location: [facility];  Service: General;  Laterality: Right;  . COLONOSCOPY    . 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 ESOPHAGEAL DILATION N/A 09/24/2017   Procedure: ESOPHAGEAL DILATION;  Surgeon: Rogene Houston, MD;  Location: [facility];  Service: Endoscopy;  Laterality: N/A;  . ESOPHAGOGASTRODUODENOSCOPY N/A 01/26/2014   Procedure: ESOPHAGOGASTRODUODENOSCOPY (EGD);  Surgeon: Rogene Houston, MD;  [facility];  Service: Endoscopy;  Laterality: N/A;  1030  . ESOPHAGOGASTRODUODENOSCOPY (EGD) WITH PROPOFOL N/A 09/24/2017   Procedure: ESOPHAGOGASTRODUODENOSCOPY (EGD) WITH PROPOFOL;  Surgeon: Rogene Houston, MD;  [facility];  Service: Endoscopy;  Laterality: N/A;  11:15  . HIP FRACTURE SURGERY     05/2009 left -[facility]  . MALONEY DILATION N/A 01/26/2014   Procedure: MALONEY DILATION;  Surgeon: Mechele Dawley  Laural Golden, MD;  Location: [facility];  Service: Endoscopy;  Laterality: N/A;   Medications: Reviewed & Updated - see associated section                       Current Outpatient Medications:  .  ALPRAZolam (XANAX) 0.5 MG tablet, Take 0.5 mg by mouth at bedtime as needed for anxiety., Disp: , Rfl:  .  atorvastatin (LIPITOR) 10 MG tablet, Take 10 mg by mouth daily., Disp: , Rfl:  .  cyanocobalamin (,VITAMIN B-12,) 1000 MCG/ML injection, Inject 1,000 mcg into the muscle every 30 (thirty) days., Disp: , Rfl:  .  docusate sodium (COLACE) 100 MG capsule, Take 2 capsules (200 mg total) by mouth daily. (Patient taking differently: Take 300 mg by mouth daily as needed for mild constipation. ), Disp: 10 capsule, Rfl: 0 .  famotidine (PEPCID) 20 MG  tablet, Take 1 tablet (20 mg total) by mouth 2 (two) times daily., Disp: 60 tablet, Rfl: 11 .  ferrous sulfate 325 (65 FE) MG tablet, Take 325 mg by mouth daily with breakfast., Disp: , Rfl:  .  gabapentin (NEURONTIN) 100 MG capsule, Take 1 capsule (100 mg total) by mouth at bedtime., Disp: 30 capsule, Rfl: 3 .  HYDROcodone-acetaminophen (NORCO) 10-325 MG tablet, Take 1 tablet by mouth 3 (three) times daily as needed for moderate pain. , Disp: , Rfl:  .  Polyethyl Glycol-Propyl Glycol (SYSTANE OP), Place 1 drop into both eyes daily as needed (for dry eyes)., Disp: , Rfl:  .  polyethylene glycol powder (GLYCOLAX/MIRALAX) powder, Take 8.5 g by mouth daily. (Patient taking differently: Take 8.5 g by mouth daily as needed for moderate constipation. ), Disp: 255 g, Rfl: 0 .  tamoxifen (NOLVADEX) 20 MG tablet, Take 1 tablet (20 mg total) by mouth daily., Disp: 90 tablet, Rfl: 2 .  [START ON 04/21/2018] venlafaxine XR (EFFEXOR-XR) 150 MG 24 hr capsule, 225 mg daily (150 mg + 75 mg), Disp: 90 capsule, Rfl: 0 .  [START ON 05/11/2018] venlafaxine XR (EFFEXOR-XR) 75 MG 24 hr capsule, 225 mg daily (150 mg + 75 mg), Disp: 90 capsule, Rfl: 0 .  QUEtiapine (SEROQUEL) 25 MG tablet, Take 1 tablet (25 mg total) by mouth at bedtime. (Patient not taking: Reported on 04/19/2018), Disp: 30 tablet, Rfl: 1   Social History: Reviewed -  reports that she quit smoking about 57 years ago. Her smoking use included cigarettes. She has a 1.00 pack-year smoking history. She has never used smokeless tobacco.  Objective Findings:  Vitals: Blood pressure (!) 148/83, pulse 78, height 5\' 5"  (1.651 m), weight 124 lb (56.2 kg).  PHYSICAL EXAMINATION General appearance - alert, well appearing, and in no distress and oriented to person, place, and time Mental status - alert, oriented to person, place, and time, normal mood, behavior, speech, dress, motor activity, and thought processes  Discussion: 1. Discussed with pt risks and  benefits of tamoxifen use and 10 mm thickened endometrium lining.   At end of discussion, pt had opportunity to ask questions and has no further questions at this time.   Specific discussion of tamoxifen use and 10 mm thickened endometrium lining as noted above. Greater than 50% was spent in counseling and coordination of care with the patient.   Total time greater than: 15 minutes.    Assessment & Plan:   A:  1.  endometrial thickening 2. Breast cancer on tamoxifen  P:  1.   the patient is scheduled for  an hysteroscopy and D&C on 05/24/18    By signing my name below, I, Soijett Blue, attest that this documentation has been prepared under the direction and in the presence of Jonnie Kind, MD. Electronically Signed: Soijett Blue, Presenter, broadcasting. 04/19/18. 4:19 PM.  I personally performed the services described in this documentation, which was SCRIBED in my presence. The recorded information has been reviewed and considered accurate. It has been edited as necessary during review. Jonnie Kind, MD

## 2018-05-17 ENCOUNTER — Encounter (HOSPITAL_COMMUNITY): Payer: Self-pay

## 2018-05-17 ENCOUNTER — Other Ambulatory Visit: Payer: Self-pay

## 2018-05-17 ENCOUNTER — Encounter (HOSPITAL_COMMUNITY)
Admission: RE | Admit: 2018-05-17 | Discharge: 2018-05-17 | Disposition: A | Discharge status: Home / Self Care | Payer: Medicare Other | Source: Ambulatory Visit | Attending: Obstetrics and Gynecology | Admitting: Obstetrics and Gynecology

## 2018-05-17 ENCOUNTER — Telehealth (HOSPITAL_COMMUNITY): Payer: Self-pay | Admitting: *Deleted

## 2018-05-17 DIAGNOSIS — Z01812 Encounter for preprocedural laboratory examination: Secondary | ICD-10-CM | POA: Diagnosis not present

## 2018-05-17 LAB — CBC
HCT: 39.1 % (ref 36.0–46.0)
Hemoglobin: 11.9 g/dL — ABNORMAL LOW (ref 12.0–15.0)
MCH: 30 pg (ref 26.0–34.0)
MCHC: 30.4 g/dL (ref 30.0–36.0)
MCV: 98.5 fL (ref 80.0–100.0)
Platelets: 251 10*3/uL (ref 150–400)
RBC: 3.97 MIL/uL (ref 3.87–5.11)
RDW: 11.9 % (ref 11.5–15.5)
WBC: 6.9 10*3/uL (ref 4.0–10.5)
nRBC: 0 % (ref 0.0–0.2)

## 2018-05-17 LAB — URINALYSIS, ROUTINE W REFLEX MICROSCOPIC
Bacteria, UA: NONE SEEN
Bilirubin Urine: NEGATIVE
GLUCOSE, UA: NEGATIVE mg/dL
Ketones, ur: NEGATIVE mg/dL
Leukocytes,Ua: NEGATIVE
NITRITE: NEGATIVE
Protein, ur: NEGATIVE mg/dL
Specific Gravity, Urine: 1.017 (ref 1.005–1.030)
pH: 5 (ref 5.0–8.0)

## 2018-05-17 NOTE — Telephone Encounter (Signed)
Mailbox is full unable to LVM

## 2018-05-17 NOTE — Telephone Encounter (Signed)
She can discontinue quetiapine without weaning off. Will discuss treatment options at her next visit.

## 2018-05-17 NOTE — Telephone Encounter (Signed)
Dr Marilyn Haas Patient has called stating that her depression medication is causing her to be drowsy during the day & she feels   awful the Quetiapine she wants to know if she can just STOP taking or does she have to [facility] off? Next visit is  06-02-2018

## 2018-05-17 NOTE — Telephone Encounter (Signed)
Patient called & has been informed

## 2018-05-18 ENCOUNTER — Encounter: Payer: Medicare Other | Admitting: Obstetrics and Gynecology

## 2018-05-18 DIAGNOSIS — Z23 Encounter for immunization: Secondary | ICD-10-CM | POA: Diagnosis not present

## 2018-05-18 DIAGNOSIS — Z6822 Body mass index (BMI) 22.0-22.9, adult: Secondary | ICD-10-CM | POA: Diagnosis not present

## 2018-05-18 DIAGNOSIS — J309 Allergic rhinitis, unspecified: Secondary | ICD-10-CM | POA: Diagnosis not present

## 2018-05-18 DIAGNOSIS — F5101 Primary insomnia: Secondary | ICD-10-CM | POA: Diagnosis not present

## 2018-05-18 DIAGNOSIS — G894 Chronic pain syndrome: Secondary | ICD-10-CM | POA: Diagnosis not present

## 2018-05-24 ENCOUNTER — Encounter (HOSPITAL_COMMUNITY): Payer: Self-pay | Admitting: *Deleted

## 2018-05-24 ENCOUNTER — Ambulatory Visit (HOSPITAL_COMMUNITY)
Admission: RE | Admit: 2018-05-24 | Discharge: 2018-05-24 | Disposition: A | Discharge status: Home / Self Care | Payer: Medicare Other | Attending: Obstetrics and Gynecology | Admitting: Obstetrics and Gynecology

## 2018-05-24 ENCOUNTER — Other Ambulatory Visit: Payer: Self-pay | Admitting: *Deleted

## 2018-05-24 ENCOUNTER — Other Ambulatory Visit (HOSPITAL_COMMUNITY)
Admission: RE | Admit: 2018-05-24 | Discharge: 2018-05-24 | Disposition: A | Discharge status: Home / Self Care | Payer: Medicare Other | Source: Ambulatory Visit | Attending: Obstetrics and Gynecology | Admitting: Obstetrics and Gynecology

## 2018-05-24 ENCOUNTER — Encounter (HOSPITAL_COMMUNITY)
Admission: RE | Disposition: A | Discharge status: Home / Self Care | Payer: Self-pay | Source: Home / Self Care | Attending: Obstetrics and Gynecology

## 2018-05-24 ENCOUNTER — Ambulatory Visit (HOSPITAL_COMMUNITY): Payer: Medicare Other | Admitting: Anesthesiology

## 2018-05-24 DIAGNOSIS — R9389 Abnormal findings on diagnostic imaging of other specified body structures: Secondary | ICD-10-CM | POA: Diagnosis not present

## 2018-05-24 DIAGNOSIS — M1990 Unspecified osteoarthritis, unspecified site: Secondary | ICD-10-CM | POA: Insufficient documentation

## 2018-05-24 DIAGNOSIS — M810 Age-related osteoporosis without current pathological fracture: Secondary | ICD-10-CM | POA: Insufficient documentation

## 2018-05-24 DIAGNOSIS — Z923 Personal history of irradiation: Secondary | ICD-10-CM | POA: Diagnosis not present

## 2018-05-24 DIAGNOSIS — Z01419 Encounter for gynecological examination (general) (routine) without abnormal findings: Secondary | ICD-10-CM

## 2018-05-24 DIAGNOSIS — F419 Anxiety disorder, unspecified: Secondary | ICD-10-CM | POA: Diagnosis not present

## 2018-05-24 DIAGNOSIS — M8580 Other specified disorders of bone density and structure, unspecified site: Secondary | ICD-10-CM | POA: Insufficient documentation

## 2018-05-24 DIAGNOSIS — M549 Dorsalgia, unspecified: Secondary | ICD-10-CM | POA: Diagnosis not present

## 2018-05-24 DIAGNOSIS — Z833 Family history of diabetes mellitus: Secondary | ICD-10-CM | POA: Insufficient documentation

## 2018-05-24 DIAGNOSIS — G8929 Other chronic pain: Secondary | ICD-10-CM | POA: Diagnosis not present

## 2018-05-24 DIAGNOSIS — K449 Diaphragmatic hernia without obstruction or gangrene: Secondary | ICD-10-CM | POA: Diagnosis not present

## 2018-05-24 DIAGNOSIS — G4700 Insomnia, unspecified: Secondary | ICD-10-CM | POA: Insufficient documentation

## 2018-05-24 DIAGNOSIS — Z853 Personal history of malignant neoplasm of breast: Secondary | ICD-10-CM | POA: Insufficient documentation

## 2018-05-24 DIAGNOSIS — K219 Gastro-esophageal reflux disease without esophagitis: Secondary | ICD-10-CM | POA: Diagnosis not present

## 2018-05-24 DIAGNOSIS — Z79899 Other long term (current) drug therapy: Secondary | ICD-10-CM | POA: Diagnosis not present

## 2018-05-24 DIAGNOSIS — Z888 Allergy status to other drugs, medicaments and biological substances status: Secondary | ICD-10-CM | POA: Insufficient documentation

## 2018-05-24 DIAGNOSIS — F329 Major depressive disorder, single episode, unspecified: Secondary | ICD-10-CM | POA: Diagnosis not present

## 2018-05-24 DIAGNOSIS — Z87891 Personal history of nicotine dependence: Secondary | ICD-10-CM | POA: Insufficient documentation

## 2018-05-24 DIAGNOSIS — Z86010 Personal history of colonic polyps: Secondary | ICD-10-CM | POA: Diagnosis not present

## 2018-05-24 DIAGNOSIS — Z96642 Presence of left artificial hip joint: Secondary | ICD-10-CM | POA: Diagnosis not present

## 2018-05-24 DIAGNOSIS — H9190 Unspecified hearing loss, unspecified ear: Secondary | ICD-10-CM | POA: Diagnosis not present

## 2018-05-24 DIAGNOSIS — Z8249 Family history of ischemic heart disease and other diseases of the circulatory system: Secondary | ICD-10-CM | POA: Diagnosis not present

## 2018-05-24 DIAGNOSIS — M419 Scoliosis, unspecified: Secondary | ICD-10-CM | POA: Diagnosis not present

## 2018-05-24 DIAGNOSIS — N840 Polyp of corpus uteri: Secondary | ICD-10-CM | POA: Diagnosis not present

## 2018-05-24 DIAGNOSIS — Z8379 Family history of other diseases of the digestive system: Secondary | ICD-10-CM | POA: Insufficient documentation

## 2018-05-24 DIAGNOSIS — N8500 Endometrial hyperplasia, unspecified: Secondary | ICD-10-CM | POA: Diagnosis not present

## 2018-05-24 DIAGNOSIS — Z803 Family history of malignant neoplasm of breast: Secondary | ICD-10-CM | POA: Insufficient documentation

## 2018-05-24 HISTORY — PX: HYSTEROSCOPY WITH D & C: SHX1775

## 2018-05-24 SURGERY — DILATATION AND CURETTAGE /HYSTEROSCOPY
Anesthesia: General

## 2018-05-24 MED ORDER — PROPOFOL 10 MG/ML IV BOLUS
INTRAVENOUS | Status: DC | PRN
  Administered 2018-05-24: 110 mg via INTRAVENOUS

## 2018-05-24 MED ORDER — SUCCINYLCHOLINE CHLORIDE 200 MG/10ML IV SOSY
PREFILLED_SYRINGE | INTRAVENOUS | Status: AC
  Filled 2018-05-24: qty 10

## 2018-05-24 MED ORDER — PROPOFOL 10 MG/ML IV BOLUS
INTRAVENOUS | Status: AC
  Filled 2018-05-24: qty 20

## 2018-05-24 MED ORDER — PROMETHAZINE HCL 25 MG/ML IJ SOLN: 6.2500 mg | INTRAMUSCULAR | Status: DC | PRN

## 2018-05-24 MED ORDER — BUPIVACAINE-EPINEPHRINE (PF) 0.5% -1:200000 IJ SOLN
INTRAMUSCULAR | Status: AC
  Filled 2018-05-24: qty 30

## 2018-05-24 MED ORDER — SURGILUBE EX GEL
CUTANEOUS | Status: DC | PRN
  Administered 2018-05-24: 1 via TOPICAL

## 2018-05-24 MED ORDER — ONDANSETRON HCL 4 MG/2ML IJ SOLN
INTRAMUSCULAR | Status: DC | PRN
  Administered 2018-05-24: 4 mg via INTRAVENOUS

## 2018-05-24 MED ORDER — LIDOCAINE 2% (20 MG/ML) 5 ML SYRINGE
INTRAMUSCULAR | Status: DC | PRN
  Administered 2018-05-24: 40 mg via INTRAVENOUS

## 2018-05-24 MED ORDER — CEFAZOLIN SODIUM-DEXTROSE 2-4 GM/100ML-% IV SOLN
2.0000 g | INTRAVENOUS | Status: AC
  Administered 2018-05-24: 2 g via INTRAVENOUS
  Filled 2018-05-24: qty 100

## 2018-05-24 MED ORDER — SODIUM CHLORIDE 0.9 % IR SOLN
Status: DC | PRN
  Administered 2018-05-24: 3000 mL

## 2018-05-24 MED ORDER — SODIUM CHLORIDE 0.9 % IR SOLN
Status: DC | PRN
  Administered 2018-05-24: 1000 mL

## 2018-05-24 MED ORDER — HYDROCODONE-ACETAMINOPHEN 7.5-325 MG PO TABS: 1.0000 | ORAL_TABLET | Freq: Once | ORAL | Status: DC | PRN

## 2018-05-24 MED ORDER — GLYCOPYRROLATE PF 0.2 MG/ML IJ SOSY
PREFILLED_SYRINGE | INTRAMUSCULAR | Status: DC | PRN
  Administered 2018-05-24: .2 mg via INTRAVENOUS

## 2018-05-24 MED ORDER — GLYCOPYRROLATE PF 0.2 MG/ML IJ SOSY
PREFILLED_SYRINGE | INTRAMUSCULAR | Status: AC
  Filled 2018-05-24: qty 4

## 2018-05-24 MED ORDER — MIDAZOLAM HCL 2 MG/2ML IJ SOLN: 0.5000 mg | Freq: Once | INTRAMUSCULAR | Status: DC | PRN

## 2018-05-24 MED ORDER — ROCURONIUM BROMIDE 10 MG/ML (PF) SYRINGE
PREFILLED_SYRINGE | INTRAVENOUS | Status: AC
  Filled 2018-05-24: qty 10

## 2018-05-24 MED ORDER — LACTATED RINGERS IV SOLN
INTRAVENOUS | Status: DC
  Administered 2018-05-24: 1000 mL via INTRAVENOUS

## 2018-05-24 MED ORDER — SUGAMMADEX SODIUM 200 MG/2ML IV SOLN
INTRAVENOUS | Status: AC
  Filled 2018-05-24: qty 2

## 2018-05-24 MED ORDER — FENTANYL CITRATE (PF) 100 MCG/2ML IJ SOLN
INTRAMUSCULAR | Status: AC
  Filled 2018-05-24: qty 2

## 2018-05-24 MED ORDER — GLYCOPYRROLATE PF 0.2 MG/ML IJ SOSY
PREFILLED_SYRINGE | INTRAMUSCULAR | Status: AC
  Filled 2018-05-24: qty 1

## 2018-05-24 MED ORDER — LIDOCAINE 2% (20 MG/ML) 5 ML SYRINGE
INTRAMUSCULAR | Status: AC
  Filled 2018-05-24: qty 5

## 2018-05-24 MED ORDER — FENTANYL CITRATE (PF) 100 MCG/2ML IJ SOLN
INTRAMUSCULAR | Status: DC | PRN
  Administered 2018-05-24 (×2): 25 ug via INTRAVENOUS

## 2018-05-24 MED ORDER — HYDROMORPHONE HCL 1 MG/ML IJ SOLN: 0.2500 mg | INTRAMUSCULAR | Status: DC | PRN

## 2018-05-24 MED ORDER — ONDANSETRON HCL 4 MG/2ML IJ SOLN
INTRAMUSCULAR | Status: AC
  Filled 2018-05-24: qty 2

## 2018-05-24 MED ORDER — LIDOCAINE 2% (20 MG/ML) 5 ML SYRINGE
INTRAMUSCULAR | Status: AC
  Filled 2018-05-24: qty 10

## 2018-05-24 SURGICAL SUPPLY — 25 items
CLOTH BEACON ORANGE TIMEOUT ST (SAFETY) ×2 IMPLANT
COVER LIGHT HANDLE STERIS (MISCELLANEOUS) ×4 IMPLANT
COVER WAND RF STERILE (DRAPES) ×2 IMPLANT
DECANTER SPIKE VIAL GLASS SM (MISCELLANEOUS) ×2 IMPLANT
GAUZE 4X4 16PLY RFD (DISPOSABLE) ×2 IMPLANT
GLOVE BIOGEL PI IND STRL 7.0 (GLOVE) ×2 IMPLANT
GLOVE BIOGEL PI IND STRL 9 (GLOVE) ×1 IMPLANT
GLOVE BIOGEL PI INDICATOR 7.0 (GLOVE) ×2
GLOVE BIOGEL PI INDICATOR 9 (GLOVE) ×1
GLOVE ECLIPSE 6.5 STRL STRAW (GLOVE) ×2 IMPLANT
GLOVE ECLIPSE 9.0 STRL (GLOVE) ×2 IMPLANT
GOWN SPEC L3 XXLG W/TWL (GOWN DISPOSABLE) ×2 IMPLANT
GOWN STRL REUS W/TWL LRG LVL3 (GOWN DISPOSABLE) ×2 IMPLANT
INST SET HYSTEROSCOPY (KITS) ×2 IMPLANT
IV NS IRRIG 3000ML ARTHROMATIC (IV SOLUTION) ×2 IMPLANT
KIT TURNOVER KIT A (KITS) ×2 IMPLANT
MANIFOLD NEPTUNE II (INSTRUMENTS) ×2 IMPLANT
NS IRRIG 1000ML POUR BTL (IV SOLUTION) ×2 IMPLANT
PACK PERI GYN (CUSTOM PROCEDURE TRAY) ×2 IMPLANT
PAD ARMBOARD 7.5X6 YLW CONV (MISCELLANEOUS) ×2 IMPLANT
PAD TELFA 3X4 1S STER (GAUZE/BANDAGES/DRESSINGS) ×2 IMPLANT
SET BASIN LINEN APH (SET/KITS/TRAYS/PACK) ×2 IMPLANT
SET CYSTO W/LG BORE CLAMP LF (SET/KITS/TRAYS/PACK) ×2 IMPLANT
SURGILUBE 3G PEEL PACK STRL (MISCELLANEOUS) ×2 IMPLANT
SYR CONTROL 10ML LL (SYRINGE) ×2 IMPLANT

## 2018-05-24 NOTE — Anesthesia Procedure Notes (Signed)
Procedure Name: LMA Insertion Date/Time: 05/24/2018 9:03 AM Performed by: Teletha Petrea A, CRNA Pre-anesthesia Checklist: Patient identified, Emergency Drugs available, Suction available, Patient being monitored and Timeout performed Patient Re-evaluated:Patient Re-evaluated prior to induction Oxygen Delivery Method: Circle system utilized Preoxygenation: Pre-oxygenation with 100% oxygen Induction Type: IV induction LMA: LMA inserted LMA Size: 4.0 Number of attempts: 1 Placement Confirmation: positive ETCO2 and breath sounds checked- equal and bilateral Tube secured with: Tape Dental Injury: Teeth and Oropharynx as per pre-operative assessment

## 2018-05-24 NOTE — Anesthesia Postprocedure Evaluation (Signed)
Anesthesia Post Note Late entry for 1019 Patient: Marilyn Haas  Procedure(s) Performed: DILATATION AND CURETTAGE /HYSTEROSCOPY (PROCEDURE # 2)/PAP SMEAR (PROCEDURE #1)  Patient location during evaluation: PACU Anesthesia Type: General Level of consciousness: awake and alert and oriented Pain management: pain level controlled Vital Signs Assessment: post-procedure vital signs reviewed and stable Respiratory status: spontaneous breathing Cardiovascular status: stable Postop Assessment: no apparent nausea or vomiting Anesthetic complications: no     Last Vitals:  Vitals:   05/24/18 1015 05/24/18 1030  BP: (!) 171/88 (!) 170/84  Pulse: 95 88  Resp: (!) 9 (!) 7  Temp:    SpO2: 96% 97%    Last Pain:  Vitals:   05/24/18 1005  TempSrc:   PainSc: 0-No pain                 ADAMS, AMY A

## 2018-05-24 NOTE — Op Note (Signed)
Please see the brief operative note for surgical details 

## 2018-05-24 NOTE — Anesthesia Preprocedure Evaluation (Signed)
Anesthesia Evaluation  Patient identified by MRN, date of birth, ID band Patient awake    Reviewed: Allergy & Precautions, NPO status , Patient's Chart, lab work & pertinent test results  Airway Mallampati: I  TM Distance: >3 FB Neck ROM: Full    Dental no notable dental hx. (+) Edentulous Upper, Edentulous Lower   Pulmonary asthma , former smoker,    Pulmonary exam normal breath sounds clear to auscultation       Cardiovascular Exercise Tolerance: Good hypertension, Normal cardiovascular examI Rhythm:Regular Rate:Normal     Neuro/Psych Anxiety Depression negative neurological ROS  negative psych ROS   GI/Hepatic Neg liver ROS, hiatal hernia, GERD  Medicated and Controlled,  Endo/Other  negative endocrine ROS  Renal/GU negative Renal ROS  negative genitourinary   Musculoskeletal  (+) Arthritis , Osteoarthritis,    Abdominal   Peds negative pediatric ROS (+)  Hematology negative hematology ROS (+) anemia ,   Anesthesia Other Findings   Reproductive/Obstetrics negative OB ROS                             Anesthesia Physical Anesthesia Plan  ASA: II  Anesthesia Plan: General   Post-op Pain Management:    Induction: Intravenous  PONV Risk Score and Plan:   Airway Management Planned: LMA  Additional Equipment:   Intra-op Plan:   Post-operative Plan: Extubation in OR  Informed Consent: I have reviewed the patients History and Physical, chart, labs and discussed the procedure including the risks, benefits and alternatives for the proposed anesthesia with the patient or authorized representative who has indicated his/her understanding and acceptance.     Dental advisory given  Plan Discussed with: CRNA  Anesthesia Plan Comments: (LMA planned -GETA as needed)        Anesthesia Quick Evaluation

## 2018-05-24 NOTE — Transfer of Care (Signed)
Immediate Anesthesia Transfer of Care Note  Patient: Marilyn Haas  Procedure(s) Performed: DILATATION AND CURETTAGE /HYSTEROSCOPY (PROCEDURE # 2)/PAP SMEAR (PROCEDURE #1)  Patient Location: PACU  Anesthesia Type:General  Level of Consciousness: awake, alert , oriented and patient cooperative  Airway & Oxygen Therapy: Patient Spontanous Breathing  Post-op Assessment: Report given to RN and Post -op Vital signs reviewed and stable  Post vital signs: Reviewed and stable  Last Vitals:  Vitals Value Taken Time  BP 184/89 05/24/2018 10:02 AM  Temp    Pulse 94 05/24/2018 10:07 AM  Resp 16 05/24/2018 10:08 AM  SpO2 98 % 05/24/2018 10:07 AM  Vitals shown include unvalidated device data.  Last Pain:  Vitals:   05/24/18 0806  TempSrc: Oral  PainSc: 3       Patients Stated Pain Goal: 9 (07/24/00 1117)  Complications: No apparent anesthesia complications

## 2018-05-24 NOTE — Discharge Instructions (Signed)
Dr. Glo Herring may be reached on his cell phone over the next few days at [phone] for any after-hours complications   Hysteroscopy Hysteroscopy is a procedure that is used to examine the inside of a woman's womb (uterus). This may be done for various reasons, including:  To look for lumps (tumors) and other growths in the uterus.  To evaluate abnormal bleeding, fibroid tumors, polyps, scar tissue (adhesions), or cancer of the uterus.  To determine the cause of an inability to get pregnant (infertility) or repeated losses of pregnancies (miscarriages).  To find a lost IUD (intrauterine device).  To perform a procedure that permanently prevents pregnancy (sterilization). During this procedure, a thin, flexible tube with a small light and camera (hysteroscope) is used to examine the uterus. The camera sends images to a monitor in the room so that your health care provider can view the inside of your uterus. A hysteroscopy should be done right after a menstrual period to make sure that you are not pregnant. Tell a health care provider about:  Any allergies you have.  All medicines you are taking, including vitamins, herbs, eye drops, creams, and over-the-counter medicines.  Any problems you or family members have had with the use of anesthetic medicines.  Any blood disorders you have.  Any surgeries you have had.  Any medical conditions you have.  Whether you are pregnant or may be pregnant. What are the risks? Generally, this is a safe procedure. However, problems may occur, including:  Excessive bleeding.  Infection.  Damage to the uterus or other structures or organs.  Allergic reaction to medicines or fluids that are used in the procedure. What happens before the procedure? Staying hydrated Follow instructions from your health care provider about hydration, which may include:  Up to 2 hours before the procedure - you may continue to drink clear liquids, such as water,  clear fruit juice, black coffee, and plain tea. Eating and drinking restrictions Follow instructions from your health care provider about eating and drinking, which may include:  8 hours before the procedure - stop eating solid foods and drink clear liquids only  2 hours before the procedure - stop drinking clear liquids. General instructions  Ask your health care provider about: ? Changing or stopping your normal medicines. This is important if you take diabetes medicines or blood thinners. ? Taking medicines such as aspirin and ibuprofen. These medicines can thin your blood and cause bleeding. Do not take these medicines for 1 week before your procedure, or as told by your health care provider.  Do not use any products that contain nicotine or tobacco for 2 weeks before the procedure. This includes cigarettes and e-cigarettes. If you need help quitting, ask your health care provider.  Medicine may be placed in your cervix the day before the procedure. This medicine causes the cervix to have a larger opening (dilate). The larger opening makes it easier for the hysteroscope to be inserted into the uterus during the procedure.  Plan to have someone with you for the first 24-48 hours after the procedure, especially if you are given a medicine to make you fall asleep (general anesthetic).  Plan to have someone take you home from the hospital or clinic. What happens during the procedure?  To lower your risk of infection: ? Your health care team will wash or sanitize their hands. ? Your skin will be washed with soap. ? Hair may be removed from the surgical area.  An IV tube will be  inserted into one of your veins.  You may be given one or more of the following: ? A medicine to help you relax (sedative). ? A medicine that numbs the area around the cervix (local anesthetic). ? A medicine to make you fall asleep (general anesthetic).  A hysteroscope will be inserted through your vagina and  into your uterus.  Air or fluid will be used to enlarge your uterus, enabling your health care provider to see your uterus better. The amount of fluid used will be carefully checked throughout the procedure.  In some cases, tissue may be gently scraped from inside the uterus and sent to a lab for testing (biopsy). The procedure may vary among health care providers and hospitals. What happens after the procedure?  Your blood pressure, heart rate, breathing rate, and blood oxygen level will be monitored until the medicines you were given have worn off.  You may have some cramping. You may be given medicines for this.  You may have bleeding, which varies from light spotting to menstrual-like bleeding. This is normal.  If you had a biopsy done, it is your responsibility to get the results of your procedure. Ask your health care provider, or the department performing the procedure, when your results will be ready. Summary  Hysteroscopy is a procedure that is used to examine the inside of a woman's womb (uterus).  After the procedure, you may have bleeding, which varies from light spotting to menstrual-like bleeding. This is normal. You may also have cramping.  Plan to have someone take you home from the hospital or clinic. This information is not intended to replace advice given to you by your health care provider. Make sure you discuss any questions you have with your health care provider. Document Released: 06/08/2000 Document Revised: 03/31/2016 Document Reviewed: 03/31/2016 Elsevier Interactive Patient Education  2019 [employer].

## 2018-05-24 NOTE — H&P (Signed)
Marilyn Haas is an 78 y.o. female. She presents to [facility] for Hysteroscopy, Dilation and Curettage due to Endometrial Thickening on ultrasound, noted as part of monitoring of her Breast Cancer treatment which included Tamoxifen. She had an u/s that showed  " Pertinent Gynecological History:Transvaginal US on 09/28/2017 showed: Thickened endometrium with focal polypoid area near the fundus measuring 1.7 x 1.4 x 0.6 cm. Endometrial fluid noted. Cannot exclude endometrial cancer. Endometrial sampling should be considered to exclude carcinoma"   Menses: post-menopausal and and has had no bleeding Bleeding:  Contraception: post menopausal status DES exposure: unknown Blood transfusions: none Sexually transmitted diseases: no past history Previous GYN Procedures: u/s:  Last mammogram: normal Date: 06/2017 Last pap:  Date: not documented in EPIC OB History: G, P   Menstrual History: Menarche age: unknown No LMP recorded. Patient is postmenopausal.    Past Medical History:  Diagnosis Date  . Anemia   . Anxiety   . Arthritis   . Breast cancer ([place]) 05/19/2016   Right breast  . Childhood asthma   . Chronic back pain    Scoliosis, stenosis  . Depression   . Diverticulosis   . GERD (gastroesophageal reflux disease)   . Hard of hearing   . Headache   . History of blood transfusion   . History of colon polyps   . History of hiatal hernia   . History of pneumonia   . Insomnia   . Osteopenia   . Osteoporosis   . Personal history of radiation therapy   . Thickened endometrium 09/29/2017    Past Surgical History:  Procedure Laterality Date  . BREAST LUMPECTOMY WITH RADIOACTIVE SEED AND SENTINEL LYMPH NODE BIOPSY Right 07/31/2016   Procedure: BREAST LUMPECTOMY WITH RADIOACTIVE SEED AND SENTINEL LYMPH NODE BIOPSY, INJECT BLUE DYE RIGHT BREAST;  Surgeon: Fanny Skates, MD;  Location: [place];  Service: General;  Laterality: Right;  . COLONOSCOPY    . 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 ESOPHAGEAL DILATION N/A 09/24/2017   Procedure: ESOPHAGEAL DILATION;  Surgeon: Rogene Houston, MD;  [facility];  Service: Endoscopy;  Laterality: N/A;  . ESOPHAGOGASTRODUODENOSCOPY N/A 01/26/2014   Procedure: ESOPHAGOGASTRODUODENOSCOPY (EGD);  Surgeon: Rogene Houston, MD;  [facility];  Service: Endoscopy;  Laterality: N/A;  1030  . ESOPHAGOGASTRODUODENOSCOPY (EGD) WITH PROPOFOL N/A 09/24/2017   Procedure: ESOPHAGOGASTRODUODENOSCOPY (EGD) WITH PROPOFOL;  Surgeon: Rogene Houston, MD;  [facility];  Service: Endoscopy;  Laterality: N/A;  11:15  . HIP FRACTURE SURGERY     05/2009 left -Mayer Camel  . MALONEY DILATION N/A 01/26/2014   Procedure: Venia Minks DILATION;  Surgeon: Rogene Houston, MD;  [facility];  Service: Endoscopy;  Laterality: N/A;    Family History  Problem Relation Age of Onset  . Heart failure Mother   . Diabetes Mother   . Hypertension Mother   . Depression Mother   . Other Father        stomach ulcers; abd aneursym  . Hypertension Sister   . Heart failure Sister   . Diabetes Sister   . Depression Sister   . Diabetes Sister        prediabetes  . Cancer Maternal Aunt        breast    Social History:  reports that she quit smoking about  57 years ago. Her smoking use included cigarettes. She has a 1.00 pack-year smoking history. She has never used smokeless tobacco. She reports that she does not drink alcohol or use drugs.  Allergies:  Allergies  Allergen Reactions  . Avelox [Moxifloxacin Hcl In Nacl] Other (See Comments)    Altered mental status    Medications Prior to Admission  Medication Sig Dispense Refill Last Dose  . ALPRAZolam (XANAX) 0.25 MG tablet Take 0.25 mg by mouth at bedtime.   05/23/2018 at 2230  . atorvastatin (LIPITOR) 10 MG tablet Take 10 mg by  mouth daily.   05/23/2018 at 2230  . cyanocobalamin (,VITAMIN B-12,) 1000 MCG/ML injection Inject 1,000 mcg into the muscle every 30 (thirty) days.   Past Month at Unknown time  . docusate sodium (COLACE) 100 MG capsule Take 2 capsules (200 mg total) by mouth daily. (Patient taking differently: Take 200-300 mg by mouth daily. ) 10 capsule 0 05/23/2018 at 1100  . famotidine (PEPCID) 20 MG tablet Take 1 tablet (20 mg total) by mouth 2 (two) times daily. (Patient taking differently: Take 20 mg by mouth 2 (two) times daily as needed for heartburn. ) 60 tablet 11 05/23/2018 at 1100  . ferrous sulfate 325 (65 FE) MG tablet Take 325 mg by mouth daily with breakfast.   Past Week at Unknown time  . gabapentin (NEURONTIN) 100 MG capsule Take 1 capsule (100 mg total) by mouth at bedtime. 30 capsule 3 05/23/2018 at 2230  . HYDROcodone-acetaminophen (NORCO) 10-325 MG tablet Take 1 tablet by mouth 3 (three) times daily as needed for moderate pain.    05/23/2018 at 1200  . Polyethyl Glycol-Propyl Glycol (SYSTANE OP) Place 1 drop into both eyes daily as needed (for dry eyes).   05/24/2018 at 0700  . polyethylene glycol powder (GLYCOLAX/MIRALAX) powder Take 8.5 g by mouth daily. (Patient taking differently: Take 17 g by mouth daily as needed for moderate constipation. ) 255 g 0 Past Week at Unknown time  . QUEtiapine (SEROQUEL) 25 MG tablet Take 1 tablet (25 mg total) by mouth at bedtime. 30 tablet 1 Past Week at Unknown time  . tamoxifen (NOLVADEX) 20 MG tablet Take 1 tablet (20 mg total) by mouth daily. (Patient taking differently: Take 20 mg by mouth at bedtime. ) 90 tablet 2 05/23/2018 at 2230  . venlafaxine XR (EFFEXOR-XR) 150 MG 24 hr capsule 225 mg daily (150 mg + 75 mg) 90 capsule 0 05/23/2018 at 1100  . venlafaxine XR (EFFEXOR-XR) 75 MG 24 hr capsule 225 mg daily (150 mg + 75 mg) 90 capsule 0 Taking    ROS  Blood pressure (!) 160/85, pulse 91, temperature 98.7 F (37.1 C), temperature source Oral, resp. rate 18, SpO2 100  %. Physical Exam  Constitutional: She appears well-developed and well-nourished. No distress.  HENT:  Head: Normocephalic.  Eyes: Pupils are equal, round, and reactive to light.  Neck: Neck supple. No thyromegaly present.  Cardiovascular: Normal rate.  Respiratory: Effort normal.  GI: Soft. She exhibits no distension and no mass. There is no abdominal tenderness. There is no rebound and no guarding.  Extremities: normal , pt has good range of motion of movement of the left hip, and denies any hip discomfort.  CBC    Component Value Date/Time   WBC 6.9 05/17/2018 1314   RBC 3.97 05/17/2018 1314   HGB 11.9 (L) 05/17/2018 1314   HGB 11.2 (L) 12/21/2016 0931   HCT 39.1 05/17/2018 1314   HCT 35.0 12/21/2016  0931   PLT 251 05/17/2018 1314   PLT 179 12/21/2016 0931   MCV 98.5 05/17/2018 1314   MCV 94.1 12/21/2016 0931   MCH 30.0 05/17/2018 1314   MCHC 30.4 05/17/2018 1314   RDW 11.9 05/17/2018 1314   RDW 13.0 12/21/2016 0931   LYMPHSABS 1.7 03/23/2018 1133   LYMPHSABS 1.6 12/21/2016 0931   MONOABS 0.6 03/23/2018 1133   MONOABS 0.5 12/21/2016 0931   EOSABS 0.2 03/23/2018 1133   EOSABS 0.1 12/21/2016 0931   BASOSABS 0.1 03/23/2018 1133   BASOSABS 0.0 12/21/2016 0931    CMP Latest Ref Rng & Units 03/23/2018 09/20/2017 12/21/2016  Glucose 70 - 99 mg/dL 88 104(H) 86  BUN 8 - 23 mg/dL 15 10 14.1  Creatinine 0.44 - 1.00 mg/dL 0.89 0.90 0.9  Sodium 135 - 145 mmol/L 142 139 139  Potassium 3.5 - 5.1 mmol/L 4.1 4.3 4.2  Chloride 98 - 111 mmol/L 106 104 -  CO2 22 - 32 mmol/L 28 28 28   Calcium 8.9 - 10.3 mg/dL 8.7(L) 8.7(L) 8.8  Total Protein 6.5 - 8.1 g/dL 6.8 - 7.0  Total Bilirubin 0.3 - 1.2 mg/dL 0.5 - 0.40  Alkaline Phos 38 - 126 U/L 62 - 62  AST 15 - 41 U/L 12(L) - 15  ALT 0 - 44 U/L 10 - 8    No results found.  Assessment/Plan: Endometrial thickening, s/p Tamoxifen therapy for Breast Cancer. Plan: Hysteroscopy, Dilation and Curettage,   Jonnie Kind 05/24/2018, 8:33  AM

## 2018-05-24 NOTE — Brief Op Note (Signed)
05/24/2018  10:48 AM  PATIENT:  Marilyn Haas  78 y.o. female  PRE-OPERATIVE DIAGNOSIS:  Endometrial Thickening  POST-OPERATIVE DIAGNOSIS:  Endometrial Thickening, endometrial polyp  PROCEDURE: hysteroscopy, dilation and curettage, removal endometrial polyp Pap smear collected (sent to office)  SURGEON:  Surgeon(s) and Role:    Jonnie Kind, MD - Primary  PHYSICIAN ASSISTANT:   ASSISTANTS: Zoila Shutter   ANESTHESIA:   general  EBL:  10 mL   BLOOD ADMINISTERED:none  DRAINS: none   LOCAL MEDICATIONS USED:  NONE  SPECIMEN:  Source of Specimen:  Endometrial biopsy, endometrial polyp  DISPOSITION OF SPECIMEN:  PATHOLOGY  COUNTS:  YES  TOURNIQUET:  * No tourniquets in log *  DICTATION: .Dragon Dictation  PLAN OF CARE: Discharge to home after PACU  PATIENT DISPOSITION:  PACU - hemodynamically stable.   Delay start of Pharmacological VTE agent (>24hrs) due to surgical blood loss or risk of bleeding: not applicable  Details of procedure: Patient was taken the operating room prepped and draped for vaginal procedure.  Legs were supported in the yellowfin low lithotomy support with care taken for the left hip due to the history of hip replacement and limited motion of the hip. Timeout was conducted.  Prior to prepping a speculum exam was performed to identify the cervix to collect a Pap smear as no recent Pap can be identified in review of the records.  This was sent to the office for admission submission for future management The cervix was grasped with single-tooth tenaculum after prepping and draping, uterus sounded in the retroflexed position to 8-1/2 cm, dilated initially with pediatric dilators then with standard uterine dilators to 23 [place] allowing introduction of the 30 degree rigid hysteroscope into the uterine fundus identifying a thin atrophic endometrial cavity with a endometrial polyp.  Biopsies were taken of the endometrial lining with the hysteroscopic  biopsy device them, then the endometrial polyp partially amputated with the biopsy scissors and then Randall stone forceps used to obtain the bulk of the polyp.  This required several probings.  At no time was any suspicion of complication or perforation. Specimen was taken and sent for pathology.  Patient was allowed awaken go to recovery room in stable condition with minimal discomfort noted by patient in recovery area sponge and needle counts are correct.

## 2018-05-25 ENCOUNTER — Encounter (HOSPITAL_COMMUNITY): Payer: Self-pay | Admitting: Obstetrics and Gynecology

## 2018-05-26 ENCOUNTER — Telehealth: Payer: Self-pay | Admitting: Obstetrics and Gynecology

## 2018-05-26 LAB — CYTOLOGY - PAP
Diagnosis: NEGATIVE
HPV: NOT DETECTED

## 2018-05-26 NOTE — Telephone Encounter (Signed)
Left message for Marilyn Haas that biopsy is benign

## 2018-05-30 NOTE — Progress Notes (Deleted)
[facility] MD/PA/NP OP Progress Note  05/30/2018 12:55 PM Marilyn Haas  MRN:  [id number]  Chief Complaint:  HPI:  - patient underwent endometrial biopsy - Quetiapine caused drowsiness  Visit Diagnosis: No diagnosis found.  Past Psychiatric History: Please see initial evaluation for full details. I have reviewed the history. No updates at this time.     Past Medical History:  Past Medical History:  Diagnosis Date  . Anemia   . Anxiety   . Arthritis   . Breast cancer ([place]) 05/19/2016   Right breast  . Childhood asthma   . Chronic back pain    Scoliosis, stenosis  . Depression   . Diverticulosis   . GERD (gastroesophageal reflux disease)   . Hard of hearing   . Headache   . History of blood transfusion   . History of colon polyps   . History of hiatal hernia   . History of pneumonia   . Insomnia   . Osteopenia   . Osteoporosis   . Personal history of radiation therapy   . Thickened endometrium 09/29/2017    Past Surgical History:  Procedure Laterality Date  . BREAST LUMPECTOMY WITH RADIOACTIVE SEED AND SENTINEL LYMPH NODE BIOPSY Right 07/31/2016   Procedure: BREAST LUMPECTOMY WITH RADIOACTIVE SEED AND SENTINEL LYMPH NODE BIOPSY, INJECT BLUE DYE RIGHT BREAST;  Surgeon: Fanny Skates, MD;  Location: [place];  Service: General;  Laterality: Right;  . COLONOSCOPY    . 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 ESOPHAGEAL DILATION N/A 09/24/2017   Procedure: ESOPHAGEAL DILATION;  Surgeon: Rogene Houston, MD;  [facility];  Service: Endoscopy;  Laterality: N/A;  . ESOPHAGOGASTRODUODENOSCOPY N/A 01/26/2014   Procedure: ESOPHAGOGASTRODUODENOSCOPY (EGD);  Surgeon: Rogene Houston, MD;  [facility];  Service: Endoscopy;  Laterality: N/A;  1030  . ESOPHAGOGASTRODUODENOSCOPY (EGD) WITH PROPOFOL N/A 09/24/2017    Procedure: ESOPHAGOGASTRODUODENOSCOPY (EGD) WITH PROPOFOL;  Surgeon: Rogene Houston, MD;  [facility];  Service: Endoscopy;  Laterality: N/A;  11:15  . HIP FRACTURE SURGERY     05/2009 left -Mayer Camel  . HYSTEROSCOPY W/D&C  05/24/2018   Procedure: DILATATION AND CURETTAGE /HYSTEROSCOPY (PROCEDURE # 2)/PAP SMEAR (PROCEDURE #1);  Surgeon: Jonnie Kind, MD;  [facility];  Service: Gynecology;;  . Venia Minks DILATION N/A 01/26/2014   Procedure: Keturah Shavers;  Surgeon: Rogene Houston, MD;  [facility];  Service: Endoscopy;  Laterality: N/A;    Family Psychiatric History: Please see initial evaluation for full details. I have reviewed the history. No updates at this time.     Family History:  Family History  Problem Relation Age of Onset  . Heart failure Mother   . Diabetes Mother   . Hypertension Mother   . Depression Mother   . Other Father        stomach ulcers; abd aneursym  . Hypertension Sister   . Heart failure Sister   . Diabetes Sister   . Depression Sister   . Diabetes Sister        prediabetes  . Cancer Maternal Aunt        breast    Social History:  Social History   Socioeconomic History  . Marital status: Married    Spouse name: Not on file  . Number of children: 3  . Years of education:  Not on file  . Highest education level: Not on file  Occupational History    Employer: RETIRED  Social Needs  . Financial resource strain: Not on file  . Food insecurity:    Worry: Not on file    Inability: Not on file  . Transportation needs:    Medical: Not on file    Non-medical: Not on file  Tobacco Use  . Smoking status: Former Smoker    Packs/day: 0.50    Years: 2.00    Pack years: 1.00    Types: Cigarettes    Last attempt to quit: 01/15/1961    Years since quitting: 57.4  . Smokeless tobacco: Never Used  Substance and Sexual Activity  . Alcohol use: No    Alcohol/week: 0.0 standard drinks  . Drug use: No  . Sexual activity:  Not Currently    Birth control/protection: Post-menopausal  Lifestyle  . Physical activity:    Days per week: Not on file    Minutes per session: Not on file  . Stress: Not on file  Relationships  . Social connections:    Talks on phone: Not on file    Gets together: Not on file    Attends religious service: Not on file    Active member of club or organization: Not on file    Attends meetings of clubs or organizations: Not on file    Relationship status: Not on file  Other Topics Concern  . Not on file  Social History Narrative  . Not on file    Allergies:  Allergies  Allergen Reactions  . Avelox [Moxifloxacin Hcl In Nacl] Other (See Comments)    Altered mental status    Metabolic Disorder Labs: Lab Results  Component Value Date   HGBA1C 5.4 02/17/2013   MPG 108 02/17/2013   No results found for: PROLACTIN Lab Results  Component Value Date   CHOL 209 (H) 12/13/2014   TRIG 95 12/13/2014   HDL 114 12/13/2014   CHOLHDL 1.8 12/13/2014   VLDL 19 12/13/2014   LDLCALC 76 12/13/2014   LDLCALC 88 02/17/2013   Lab Results  Component Value Date   TSH 1.97 04/11/2018    Therapeutic Level Labs: No results found for: LITHIUM No results found for: VALPROATE No components found for:  CBMZ  Current Medications: Current Outpatient Medications  Medication Sig Dispense Refill  . ALPRAZolam (XANAX) 0.25 MG tablet Take 0.25 mg by mouth at bedtime.    Marland Kitchen atorvastatin (LIPITOR) 10 MG tablet Take 10 mg by mouth daily.    . cyanocobalamin (,VITAMIN B-12,) 1000 MCG/ML injection Inject 1,000 mcg into the muscle every 30 (thirty) days.    Marland Kitchen docusate sodium (COLACE) 100 MG capsule Take 2 capsules (200 mg total) by mouth daily. (Patient taking differently: Take 200-300 mg by mouth daily. ) 10 capsule 0  . famotidine (PEPCID) 20 MG tablet Take 1 tablet (20 mg total) by mouth 2 (two) times daily. (Patient taking differently: Take 20 mg by mouth 2 (two) times daily as needed for heartburn.  ) 60 tablet 11  . ferrous sulfate 325 (65 FE) MG tablet Take 325 mg by mouth daily with breakfast.    . gabapentin (NEURONTIN) 100 MG capsule Take 1 capsule (100 mg total) by mouth at bedtime. 30 capsule 3  . HYDROcodone-acetaminophen (NORCO) 10-325 MG tablet Take 1 tablet by mouth 3 (three) times daily as needed for moderate pain.     Vladimir Faster Glycol-Propyl Glycol (SYSTANE OP) Place 1 drop  into both eyes daily as needed (for dry eyes).    . polyethylene glycol powder (GLYCOLAX/MIRALAX) powder Take 8.5 g by mouth daily. (Patient taking differently: Take 17 g by mouth daily as needed for moderate constipation. ) 255 g 0  . QUEtiapine (SEROQUEL) 25 MG tablet Take 1 tablet (25 mg total) by mouth at bedtime. 30 tablet 1  . tamoxifen (NOLVADEX) 20 MG tablet Take 1 tablet (20 mg total) by mouth daily. (Patient taking differently: Take 20 mg by mouth at bedtime. ) 90 tablet 2  . venlafaxine XR (EFFEXOR-XR) 150 MG 24 hr capsule 225 mg daily (150 mg + 75 mg) 90 capsule 0  . venlafaxine XR (EFFEXOR-XR) 75 MG 24 hr capsule 225 mg daily (150 mg + 75 mg) 90 capsule 0   No current facility-administered medications for this visit.      Musculoskeletal: Strength & Muscle Tone: within normal limits Gait & Station: normal Patient leans: N/A  Psychiatric Specialty Exam: ROS  There were no vitals taken for this visit.There is no height or weight on file to calculate BMI.  General Appearance: Fairly Groomed  Eye Contact:  Good  Speech:  Clear and Coherent  Volume:  Normal  Mood:  {BHH MOOD:22306}  Affect:  {Affect (PAA):22687}  Thought Process:  Coherent  Orientation:  Full (Time, Place, and Person)  Thought Content: Logical   Suicidal Thoughts:  {ST/HT (PAA):22692}  Homicidal Thoughts:  {ST/HT (PAA):22692}  Memory:  Immediate;   Good  Judgement:  {Judgement (PAA):22694}  Insight:  {Insight (PAA):22695}  Psychomotor Activity:  Normal  Concentration:  Concentration: Good and Attention Span: Good   Recall:  Good  Fund of Knowledge: Good  Language: Good  Akathisia:  No  Handed:  Right  AIMS (if indicated): not done  Assets:  Communication Skills Desire for Improvement  ADL's:  Intact  Cognition: WNL  Sleep:  {BHH GOOD/FAIR/POOR:22877}   Screenings: PHQ2-9     Office Visit from 09/23/2017 in [facility] Follow Up  from 12/08/2016 in [facility] from 08/13/2016 in [facility]  PHQ-2 Total Score  6  1  1   PHQ-9 Total Score  24  -  -       Assessment and Plan:  Marilyn Haas is a 78 y.o. year old female with a history of depression, esophageal dysphasia, weight loss, history of breast cancer s/p lumpectomy, radiation, currently on anastrozole, who presents for follow up appointment for No diagnosis found.  # MDD, moderate, recurrent without psychotic features  Patient continues to report depressive symptoms.  Psychosocial stressors including grief of loss of her husband from pancreatic cancer, and her sister with medical condition, loss of her sister-in-law and her sister's children from murder.  She has taken only 1 dose of quetiapine; she is advised to take it every day to optimize the treatment.  Discussed potential metabolic side effect and drowsiness.  He is advised to contact the office if she experiences any side effect.  Discussed behavioral activation.  Although she will greatly benefit from therapy, she is not interested in this option at this time.  Will continue to monitor.   Plan 1.ContinueEffexor 225 mg daily  2. Continue quetiapine 25 mg at night  3.Return to clinic intwo months for 30 mins 4 TSH reviewed; wnl (she is not on xanax anymore per patient report)  The patient demonstrates the following risk factors for suicide: Chronic risk factors for suicide  include:psychiatric disorder ofdepression. 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 Protective factorsfor this patient include: positive social support, coping skills and hope for the future. Considering these factors, the overall suicide risk at this point appears to below. Patientisappropriate for outpatient follow up  Norman Clay, MD 05/30/2018, 12:55 PM

## 2018-06-02 ENCOUNTER — Ambulatory Visit (HOSPITAL_COMMUNITY): Payer: Medicare Other | Admitting: Psychiatry

## 2018-06-06 ENCOUNTER — Encounter: Payer: Self-pay | Admitting: Obstetrics and Gynecology

## 2018-06-06 ENCOUNTER — Encounter: Payer: Medicare Other | Admitting: Obstetrics and Gynecology

## 2018-06-07 NOTE — Progress Notes (Signed)
Virtual Visit via Telephone Note  I connected with [name] on 06/13/18 at  1:00 PM EDT by telephone and verified that I am speaking with the correct person using two identifiers.   I discussed the limitations, risks, security and privacy concerns of performing an evaluation and management service by telephone and the availability of in person appointments. I also discussed with the patient that there may be a patient responsible charge related to this service. The patient expressed understanding and agreed to proceed.    I discussed the assessment and treatment plan with the patient. The patient was provided an opportunity to ask questions and all were answered. The patient agreed with the plan and demonstrated an understanding of the instructions.   The patient was advised to call back or seek an in-person evaluation if the symptoms worsen or if the condition fails to improve as anticipated.  I provided 30 minutes of non-face-to-face time during this encounter.   Norman Clay, MD     [facility] MD/PA/NP OP Progress Note  06/13/2018 1:34 PM Marilyn Haas  MRN:  [id number]  Chief Complaint:  Chief Complaint    Depression; Follow-up     HPI:  -quetiapine discontinued  This is a follow-up phone visit for depression.  She states that she has been feeling more anxious due to COVID 19.  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  Her son has been helping the patient to deal with this issues.  She feels desperate, stating that she still misses her Haas. She has not been able to go outside, referring to a neighbor who speaks to the patient non-stop. She agrees to find a way to go outside while excusing herself from this person. She has insomnia.  She feels fatigue.  She has significant anhedonia.  She has fair concentration.  She has decreased appetite.  She denies SI.  She feels anxious and tense.  She denies feeling irritable.  She denies  panic attacks. She could not continue quetiapine due to drowsiness.   126 lbs Wt Readings from Last 3 Encounters:  05/17/18 115 lb (52.2 kg)  04/29/18 136 lb (61.7 kg)  04/19/18 124 lb (56.2 kg)     Visit Diagnosis:    ICD-10-CM   1. MDD (major depressive disorder), recurrent episode, moderate ([place]) F33.1     Past Psychiatric History: Please see initial evaluation for full details. I have reviewed the history. No updates at this time.     Past Medical History:  Past Medical History:  Diagnosis Date  . Anemia   . Anxiety   . Arthritis   . Breast cancer ([place]) 05/19/2016   Right breast  . Childhood asthma   . Chronic back pain    Scoliosis, stenosis  . Depression   . Diverticulosis   . GERD (gastroesophageal reflux disease)   . Hard of hearing   . Headache   . History of blood transfusion   . History of colon polyps   . History of hiatal hernia   . History of pneumonia   . Insomnia   . Osteopenia   . Osteoporosis   . Personal history of radiation therapy   . Thickened endometrium 09/29/2017    Past Surgical History:  Procedure Laterality Date  . BREAST LUMPECTOMY WITH RADIOACTIVE SEED AND SENTINEL LYMPH NODE BIOPSY Right 07/31/2016   Procedure: BREAST LUMPECTOMY WITH RADIOACTIVE SEED AND SENTINEL LYMPH NODE BIOPSY, INJECT BLUE DYE RIGHT  BREAST;  Surgeon: Fanny Skates, MD;  Location: [place];  Service: General;  Laterality: Right;  . COLONOSCOPY    . 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 ESOPHAGEAL DILATION N/A 09/24/2017   Procedure: ESOPHAGEAL DILATION;  Surgeon: Rogene Houston, MD;  [facility];  Service: Endoscopy;  Laterality: N/A;  . ESOPHAGOGASTRODUODENOSCOPY N/A 01/26/2014   Procedure: ESOPHAGOGASTRODUODENOSCOPY (EGD);  Surgeon: Rogene Houston, MD;  [facility];  Service: Endoscopy;   Laterality: N/A;  1030  . ESOPHAGOGASTRODUODENOSCOPY (EGD) WITH PROPOFOL N/A 09/24/2017   Procedure: ESOPHAGOGASTRODUODENOSCOPY (EGD) WITH PROPOFOL;  Surgeon: Rogene Houston, MD;  [facility];  Service: Endoscopy;  Laterality: N/A;  11:15  . HIP FRACTURE SURGERY     05/2009 left -Mayer Camel  . HYSTEROSCOPY W/D&C  05/24/2018   Procedure: DILATATION AND CURETTAGE /HYSTEROSCOPY (PROCEDURE # 2)/PAP SMEAR (PROCEDURE #1);  Surgeon: Jonnie Kind, MD;  [facility];  Service: Gynecology;;  . Venia Minks DILATION N/A 01/26/2014   Procedure: Keturah Shavers;  Surgeon: Rogene Houston, MD;  [facility];  Service: Endoscopy;  Laterality: N/A;    Family Psychiatric History: Please see initial evaluation for full details. I have reviewed the history. No updates at this time.     Family History:  Family History  Problem Relation Age of Onset  . Heart failure Mother   . Diabetes Mother   . Hypertension Mother   . Depression Mother   . Other Father        stomach ulcers; abd aneursym  . Hypertension Sister   . Heart failure Sister   . Diabetes Sister   . Depression Sister   . Diabetes Sister        prediabetes  . Cancer Maternal Aunt        breast    Social History:  Social History   Socioeconomic History  . Marital status: Married    Spouse name: Not on file  . Number of children: 3  . Years of education: Not on file  . Highest education level: Not on file  Occupational History    Employer: RETIRED  Social Needs  . Financial resource strain: Not on file  . Food insecurity:    Worry: Not on file    Inability: Not on file  . Transportation needs:    Medical: Not on file    Non-medical: Not on file  Tobacco Use  . Smoking status: Former Smoker    Packs/day: 0.50    Years: 2.00    Pack years: 1.00    Types: Cigarettes    Last attempt to quit: 01/15/1961    Years since quitting: 57.4  . Smokeless tobacco: Never Used  Substance and Sexual Activity  .  Alcohol use: No    Alcohol/week: 0.0 standard drinks  . Drug use: No  . Sexual activity: Not Currently    Birth control/protection: Post-menopausal  Lifestyle  . Physical activity:    Days per week: Not on file    Minutes per session: Not on file  . Stress: Not on file  Relationships  . Social connections:    Talks on phone: Not on file    Gets together: Not on file    Attends religious service: Not on file    Active member of club or organization: Not on file  Attends meetings of clubs or organizations: Not on file    Relationship status: Not on file  Other Topics Concern  . Not on file  Social History Narrative  . Not on file    Allergies:  Allergies  Allergen Reactions  . Avelox [Moxifloxacin Hcl In Nacl] Other (See Comments)    Altered mental status    Metabolic Disorder Labs: Lab Results  Component Value Date   HGBA1C 5.4 02/17/2013   MPG 108 02/17/2013   No results found for: PROLACTIN Lab Results  Component Value Date   CHOL 209 (H) 12/13/2014   TRIG 95 12/13/2014   HDL 114 12/13/2014   CHOLHDL 1.8 12/13/2014   VLDL 19 12/13/2014   LDLCALC 76 12/13/2014   LDLCALC 88 02/17/2013   Lab Results  Component Value Date   TSH 1.97 04/11/2018    Therapeutic Level Labs: No results found for: LITHIUM No results found for: VALPROATE No components found for:  CBMZ  Current Medications: Current Outpatient Medications  Medication Sig Dispense Refill  . zolpidem (AMBIEN) 5 MG tablet Take 5 mg by mouth at bedtime as needed.    . ALPRAZolam (XANAX) 0.25 MG tablet Take 0.25 mg by mouth at bedtime.    Marland Kitchen atorvastatin (LIPITOR) 10 MG tablet Take 10 mg by mouth daily.    . cyanocobalamin (,VITAMIN B-12,) 1000 MCG/ML injection Inject 1,000 mcg into the muscle every 30 (thirty) days.    Marland Kitchen docusate sodium (COLACE) 100 MG capsule Take 2 capsules (200 mg total) by mouth daily. (Patient taking differently: Take 200-300 mg by mouth daily. ) 10 capsule 0  . famotidine  (PEPCID) 20 MG tablet Take 1 tablet (20 mg total) by mouth 2 (two) times daily. (Patient taking differently: Take 20 mg by mouth 2 (two) times daily as needed for heartburn. ) 60 tablet 11  . ferrous sulfate 325 (65 FE) MG tablet Take 325 mg by mouth daily with breakfast.    . gabapentin (NEURONTIN) 100 MG capsule Take 1 capsule (100 mg total) by mouth at bedtime. 30 capsule 3  . HYDROcodone-acetaminophen (NORCO) 10-325 MG tablet Take 1 tablet by mouth 3 (three) times daily as needed for moderate pain.     . mirtazapine (REMERON) 15 MG tablet 7.5 mg at night for one week, then 15 mg at night 30 tablet 1  . Polyethyl Glycol-Propyl Glycol (SYSTANE OP) Place 1 drop into both eyes daily as needed (for dry eyes).    . polyethylene glycol powder (GLYCOLAX/MIRALAX) powder Take 8.5 g by mouth daily. (Patient taking differently: Take 17 g by mouth daily as needed for moderate constipation. ) 255 g 0  . tamoxifen (NOLVADEX) 20 MG tablet Take 1 tablet (20 mg total) by mouth daily. (Patient taking differently: Take 20 mg by mouth at bedtime. ) 90 tablet 2  . venlafaxine XR (EFFEXOR-XR) 150 MG 24 hr capsule 225 mg daily (150 mg + 75 mg) 90 capsule 0  . [START ON 07/10/2018] venlafaxine XR (EFFEXOR-XR) 75 MG 24 hr capsule 225 mg daily (150 mg + 75 mg) 90 capsule 0   No current facility-administered medications for this visit.      Musculoskeletal: Strength & Muscle Tone: N/A Gait & Station: N/A Patient leans: N/A  Psychiatric Specialty Exam: Review of Systems  Psychiatric/Behavioral: Positive for depression. Negative for hallucinations, memory loss, substance abuse and suicidal ideas. The patient is nervous/anxious and has insomnia.   All other systems reviewed and are negative.   There were no vitals taken  for this visit.There is no height or weight on file to calculate BMI.  General Appearance: NA  Eye Contact:  NA  Speech:  Clear and Coherent  Volume:  Normal  Mood:  Depressed  Affect:  NA (likely  crying on the phone)  Thought Process:  Coherent  Orientation:  Full (Time, Place, and Person)  Thought Content: Logical   Suicidal Thoughts:  No  Homicidal Thoughts:  No  Memory:  Immediate;   Good  Judgement:  Good  Insight:  Fair  Psychomotor Activity:  Normal  Concentration:  Concentration: Good and Attention Span: Good  Recall:  Good  Fund of Knowledge: Good  Language: Good  Akathisia:  No  Handed:  Right  AIMS (if indicated): not done  Assets:  Communication Skills Desire for Improvement  ADL's:  Intact  Cognition: WNL  Sleep:  Poor   Screenings: PHQ2-9     Office Visit from 09/23/2017 in [facility] Follow Up  from 12/08/2016 in [place] from 08/13/2016 in [place]  PHQ-2 Total Score  6  1  1   PHQ-9 Total Score  24  -  -       Assessment and Plan:  [name] is a 78 y.o. year old female with a history of depression, esophageal dysphasia, weight loss, history of breast cancer on tamoxifen s/p lumpectomy, radiation, currently on anastrozole, who presents for follow up appointment for MDD (major depressive disorder), recurrent episode, moderate ([place])  # MDD, moderate, recurrent without psychotic features Patient continues to report depressive symptoms since the last visit.  Psychosocial stressors include grief of loss of her Haas from pancreatic cancer, her sister with medical condition.  She also reports financial impact by pandemic.  She could not tolerate quetiapine due to drowsiness.  Will start mirtazapine as adjunctive treatment for depression.  Discussed potential side effect of drowsiness and metabolic side effect.  Will continue venlafaxine to target depression.  Although she will greatly benefit from CBT, she is not interested in this option at this time.  Will continue to monitor.   Plan 1.ContinueEffexor 225 mg daily  2. Hold quetiapine  3. Start  mirtazapine 7.5 mg at night for one week, then 15 at night  4. Return to clinic in two months, 5/26 at 2:30 for 30 mins 5 TSH reviewed; wnl (she is not on xanax anymore per patient report) - xanax 0.25 mg BID, Ambien 5 mg   Past trials of medication: lexapro, duloxetine, quetiapine (drowsiness)  The patient demonstrates the following risk factors for suicide: Chronic risk factors for suicide include:psychiatric disorder ofdepression. Acute risk factorsfor suicide include: unemployment, Theme park manager and loss (financial, interpersonal, professional). Protective factorsfor this patient include: positive social support, coping skills and hope for the future. Considering these factors, the overall suicide risk at this point appears to below. Patientisappropriate for outpatient follow up.  Norman Clay, MD 06/13/2018, 1:34 PM

## 2018-06-09 ENCOUNTER — Other Ambulatory Visit: Payer: Self-pay | Admitting: Oncology

## 2018-06-09 DIAGNOSIS — Z853 Personal history of malignant neoplasm of breast: Secondary | ICD-10-CM

## 2018-06-13 ENCOUNTER — Encounter (HOSPITAL_COMMUNITY): Payer: Self-pay | Admitting: Psychiatry

## 2018-06-13 ENCOUNTER — Other Ambulatory Visit: Payer: Self-pay

## 2018-06-13 ENCOUNTER — Ambulatory Visit (INDEPENDENT_AMBULATORY_CARE_PROVIDER_SITE_OTHER): Payer: Medicare Other | Admitting: Psychiatry

## 2018-06-13 DIAGNOSIS — F331 Major depressive disorder, recurrent, moderate: Secondary | ICD-10-CM | POA: Diagnosis not present

## 2018-06-13 MED ORDER — MIRTAZAPINE 15 MG PO TABS: ORAL_TABLET | ORAL | 1 refills | Status: DC

## 2018-06-13 MED ORDER — VENLAFAXINE HCL ER 75 MG PO CP24: ORAL_CAPSULE | ORAL | 0 refills | Status: DC

## 2018-06-13 MED ORDER — VENLAFAXINE HCL ER 150 MG PO CP24: ORAL_CAPSULE | ORAL | 0 refills | Status: DC

## 2018-06-13 NOTE — Patient Instructions (Addendum)
1.ContinueEffexor 225 mg daily  (2. Hold quetiapine)  3. Start mirtazapine 7.5 mg at night for one week, then 15 at night  4. Return to clinic in two months, 5/26 at 2:30 for 30 mins

## 2018-06-15 DIAGNOSIS — G894 Chronic pain syndrome: Secondary | ICD-10-CM | POA: Diagnosis not present

## 2018-06-15 DIAGNOSIS — Z6821 Body mass index (BMI) 21.0-21.9, adult: Secondary | ICD-10-CM | POA: Diagnosis not present

## 2018-06-15 DIAGNOSIS — F419 Anxiety disorder, unspecified: Secondary | ICD-10-CM | POA: Diagnosis not present

## 2018-06-15 DIAGNOSIS — E538 Deficiency of other specified B group vitamins: Secondary | ICD-10-CM | POA: Diagnosis not present

## 2018-06-16 ENCOUNTER — Other Ambulatory Visit (INDEPENDENT_AMBULATORY_CARE_PROVIDER_SITE_OTHER): Payer: Self-pay | Admitting: Internal Medicine

## 2018-06-21 ENCOUNTER — Ambulatory Visit (INDEPENDENT_AMBULATORY_CARE_PROVIDER_SITE_OTHER): Payer: Self-pay | Admitting: Internal Medicine

## 2018-07-10 ENCOUNTER — Encounter: Payer: Self-pay | Admitting: Adult Health

## 2018-07-10 ENCOUNTER — Encounter: Payer: Self-pay | Admitting: Obstetrics and Gynecology

## 2018-07-15 DIAGNOSIS — T8484XA Pain due to internal orthopedic prosthetic devices, implants and grafts, initial encounter: Secondary | ICD-10-CM | POA: Diagnosis not present

## 2018-07-15 DIAGNOSIS — F419 Anxiety disorder, unspecified: Secondary | ICD-10-CM | POA: Diagnosis not present

## 2018-07-15 DIAGNOSIS — Z6821 Body mass index (BMI) 21.0-21.9, adult: Secondary | ICD-10-CM | POA: Diagnosis not present

## 2018-07-15 DIAGNOSIS — G894 Chronic pain syndrome: Secondary | ICD-10-CM | POA: Diagnosis not present

## 2018-07-15 DIAGNOSIS — E538 Deficiency of other specified B group vitamins: Secondary | ICD-10-CM | POA: Diagnosis not present

## 2018-08-01 ENCOUNTER — Ambulatory Visit (INDEPENDENT_AMBULATORY_CARE_PROVIDER_SITE_OTHER): Payer: Self-pay | Admitting: Internal Medicine

## 2018-08-03 NOTE — Progress Notes (Deleted)
[facility] MD/PA/NP OP Progress Note  08/03/2018 12:59 PM Marilyn Haas  MRN:  [id number]  Chief Complaint:  HPI: *** Visit Diagnosis: No diagnosis found.  Past Psychiatric History: Please see initial evaluation for full details. I have reviewed the history. No updates at this time.     Past Medical History:  Past Medical History:  Diagnosis Date  . Anemia   . Anxiety   . Arthritis   . Breast cancer ([place]) 05/19/2016   Right breast  . Childhood asthma   . Chronic back pain    Scoliosis, stenosis  . Depression   . Diverticulosis   . GERD (gastroesophageal reflux disease)   . Hard of hearing   . Headache   . History of blood transfusion   . History of colon polyps   . History of hiatal hernia   . History of pneumonia   . Insomnia   . Osteopenia   . Osteoporosis   . Personal history of radiation therapy   . Thickened endometrium 09/29/2017    Past Surgical History:  Procedure Laterality Date  . BREAST LUMPECTOMY WITH RADIOACTIVE SEED AND SENTINEL LYMPH NODE BIOPSY Right 07/31/2016   Procedure: BREAST LUMPECTOMY WITH RADIOACTIVE SEED AND SENTINEL LYMPH NODE BIOPSY, INJECT BLUE DYE RIGHT BREAST;  Surgeon: Fanny Skates, MD;  Location: [place];  Service: General;  Laterality: Right;  . COLONOSCOPY    . 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 ESOPHAGEAL DILATION N/A 09/24/2017   Procedure: ESOPHAGEAL DILATION;  Surgeon: Rogene Houston, MD;  [facility];  Service: Endoscopy;  Laterality: N/A;  . ESOPHAGOGASTRODUODENOSCOPY N/A 01/26/2014   Procedure: ESOPHAGOGASTRODUODENOSCOPY (EGD);  Surgeon: Rogene Houston, MD;  [facility];  Service: Endoscopy;  Laterality: N/A;  1030  . ESOPHAGOGASTRODUODENOSCOPY (EGD) WITH PROPOFOL N/A 09/24/2017   Procedure: ESOPHAGOGASTRODUODENOSCOPY (EGD) WITH PROPOFOL;  Surgeon: Rogene Houston, MD;  [facility];  Service: Endoscopy;  Laterality: N/A;  11:15  . HIP FRACTURE SURGERY     05/2009 left -Mayer Camel  . HYSTEROSCOPY W/D&C  05/24/2018   Procedure: DILATATION AND CURETTAGE /HYSTEROSCOPY (PROCEDURE # 2)/PAP SMEAR (PROCEDURE #1);  Surgeon: Jonnie Kind, MD;  [facility];  Service: Gynecology;;  . Venia Minks DILATION N/A 01/26/2014   Procedure: Keturah Shavers;  Surgeon: Rogene Houston, MD;  [facility];  Service: Endoscopy;  Laterality: N/A;    Family Psychiatric History: Please see initial evaluation for full details. I have reviewed the history. No updates at this time.     Family History:  Family History  Problem Relation Age of Onset  . Heart failure Mother   . Diabetes Mother   . Hypertension Mother   . Depression Mother   . Other Father        stomach ulcers; abd aneursym  . Hypertension Sister   . Heart failure Sister   . Diabetes Sister   . Depression Sister   . Diabetes Sister        prediabetes  . Cancer Maternal Aunt        breast    Social History:  Social History   Socioeconomic History  . Marital status: Married    Spouse name: Not on file  . Number of children: 3  . Years of education: Not on file  . Highest education level: Not on  file  Occupational History    Employer: RETIRED  Social Needs  . Financial resource strain: Not on file  . Food insecurity:    Worry: Not on file    Inability: Not on file  . Transportation needs:    Medical: Not on file    Non-medical: Not on file  Tobacco Use  . Smoking status: Former Smoker    Packs/day: 0.50    Years: 2.00    Pack years: 1.00    Types: Cigarettes    Last attempt to quit: 01/15/1961    Years since quitting: 57.5  . Smokeless tobacco: Never Used  Substance and Sexual Activity  . Alcohol use: No    Alcohol/week: 0.0 standard drinks  . Drug use: No  . Sexual activity: Not Currently    Birth control/protection: Post-menopausal  Lifestyle  .  Physical activity:    Days per week: Not on file    Minutes per session: Not on file  . Stress: Not on file  Relationships  . Social connections:    Talks on phone: Not on file    Gets together: Not on file    Attends religious service: Not on file    Active member of club or organization: Not on file    Attends meetings of clubs or organizations: Not on file    Relationship status: Not on file  Other Topics Concern  . Not on file  Social History Narrative  . Not on file    Allergies:  Allergies  Allergen Reactions  . Avelox [Moxifloxacin Hcl In Nacl] Other (See Comments)    Altered mental status    Metabolic Disorder Labs: Lab Results  Component Value Date   HGBA1C 5.4 02/17/2013   MPG 108 02/17/2013   No results found for: PROLACTIN Lab Results  Component Value Date   CHOL 209 (H) 12/13/2014   TRIG 95 12/13/2014   HDL 114 12/13/2014   CHOLHDL 1.8 12/13/2014   VLDL 19 12/13/2014   LDLCALC 76 12/13/2014   LDLCALC 88 02/17/2013   Lab Results  Component Value Date   TSH 1.97 04/11/2018    Therapeutic Level Labs: No results found for: LITHIUM No results found for: VALPROATE No components found for:  CBMZ  Current Medications: Current Outpatient Medications  Medication Sig Dispense Refill  . ALPRAZolam (XANAX) 0.25 MG tablet Take 0.25 mg by mouth at bedtime.    Marland Kitchen atorvastatin (LIPITOR) 10 MG tablet Take 10 mg by mouth daily.    . cyanocobalamin (,VITAMIN B-12,) 1000 MCG/ML injection Inject 1,000 mcg into the muscle every 30 (thirty) days.    Marland Kitchen docusate sodium (COLACE) 100 MG capsule Take 2 capsules (200 mg total) by mouth daily. (Patient taking differently: Take 200-300 mg by mouth daily. ) 10 capsule 0  . famotidine (PEPCID) 20 MG tablet TAKE 1 TABLET (20 MG TOTAL) BY MOUTH TWICE A DAY 60 tablet 11  . ferrous sulfate 325 (65 FE) MG tablet Take 325 mg by mouth daily with breakfast.    . gabapentin (NEURONTIN) 100 MG capsule Take 1 capsule (100 mg total) by  mouth at bedtime. 30 capsule 3  . HYDROcodone-acetaminophen (NORCO) 10-325 MG tablet Take 1 tablet by mouth 3 (three) times daily as needed for moderate pain.     . mirtazapine (REMERON) 15 MG tablet 7.5 mg at night for one week, then 15 mg at night 30 tablet 1  . pantoprazole (PROTONIX) 40 MG tablet TAKE ONE (1) TABLET EACH DAY 30 tablet 3  .  Polyethyl Glycol-Propyl Glycol (SYSTANE OP) Place 1 drop into both eyes daily as needed (for dry eyes).    . polyethylene glycol powder (GLYCOLAX/MIRALAX) powder Take 8.5 g by mouth daily. (Patient taking differently: Take 17 g by mouth daily as needed for moderate constipation. ) 255 g 0  . tamoxifen (NOLVADEX) 20 MG tablet Take 1 tablet (20 mg total) by mouth daily. (Patient taking differently: Take 20 mg by mouth at bedtime. ) 90 tablet 2  . venlafaxine XR (EFFEXOR-XR) 150 MG 24 hr capsule 225 mg daily (150 mg + 75 mg) 90 capsule 0  . venlafaxine XR (EFFEXOR-XR) 75 MG 24 hr capsule 225 mg daily (150 mg + 75 mg) 90 capsule 0  . zolpidem (AMBIEN) 5 MG tablet Take 5 mg by mouth at bedtime as needed.     No current facility-administered medications for this visit.      Musculoskeletal: Strength & Muscle Tone: N/A Gait & Station: N/A Patient leans: N/A  Psychiatric Specialty Exam: ROS  There were no vitals taken for this visit.There is no height or weight on file to calculate BMI.  General Appearance: {Appearance:22683}  Eye Contact:  {BHH EYE CONTACT:22684}  Speech:  Clear and Coherent  Volume:  Normal  Mood:  {BHH MOOD:22306}  Affect:  {Affect (PAA):22687}  Thought Process:  Coherent  Orientation:  Full (Time, Place, and Person)  Thought Content: Logical   Suicidal Thoughts:  {ST/HT (PAA):22692}  Homicidal Thoughts:  {ST/HT (PAA):22692}  Memory:  Immediate;   Good  Judgement:  {Judgement (PAA):22694}  Insight:  {Insight (PAA):22695}  Psychomotor Activity:  Normal  Concentration:  Concentration: Good and Attention Span: Good  Recall:  Good   Fund of Knowledge: Good  Language: Good  Akathisia:  No  Handed:  Right  AIMS (if indicated): not done  Assets:  Communication Skills Desire for Improvement  ADL's:  Intact  Cognition: WNL  Sleep:  {BHH GOOD/FAIR/POOR:22877}   Screenings: PHQ2-9     Office Visit from 09/23/2017 in [place] Follow Up  from 12/08/2016 in [place] from 08/13/2016 in [facility] [facility]  PHQ-2 Total Score  6  1  1   PHQ-9 Total Score  24  -  -       Assessment and Plan:  [name] is a 79 y.o. year old female with a history of depression, esophageal dysphasia, weight loss, history of breast cancer on tamoxifen s/p lumpectomy, radiation, currently on anastrozole, who presents for follow up appointment for No diagnosis found.  # MDD, moderate, recurrent without psychotic features  Patient continues to report depressive symptoms since the last visit.  Psychosocial stressors include grief of loss of her husband from pancreatic cancer, her sister with medical condition.  She also reports financial impact by pandemic.  She could not tolerate quetiapine due to drowsiness.  Will start mirtazapine as adjunctive treatment for depression.  Discussed potential side effect of drowsiness and metabolic side effect.  Will continue venlafaxine to target depression.  Although she will greatly benefit from CBT, she is not interested in this option at this time.  Will continue to monitor.   Plan 1.ContinueEffexor 225 mg daily  2. Hold quetiapine  3. Start mirtazapine 7.5 mg at night for one week, then 15 at night  4. Return to clinic in two months, 5/26 at 2:30 for 30 mins 5TSH reviewed; wnl (she is not on xanax anymore per patient report) - xanax 0.25  mg BID, Ambien 5 mg   Past trials of medication:lexapro, duloxetine, quetiapine (drowsiness)  The patient demonstrates the following risk factors for suicide: Chronic risk  factors for suicide include:psychiatric disorder ofdepression. Acute risk factorsfor suicide include: unemployment, Theme park manager and loss (financial, interpersonal, professional). Protective factorsfor this patient include: positive social support, coping skills and hope for the future. Considering these factors, the overall suicide risk at this point appears to below. Patientisappropriate for outpatient follow up.   Norman Clay, MD 08/03/2018, 12:59 PM

## 2018-08-09 ENCOUNTER — Other Ambulatory Visit: Payer: Self-pay

## 2018-08-09 ENCOUNTER — Ambulatory Visit (HOSPITAL_COMMUNITY): Payer: Medicare Other | Admitting: Psychiatry

## 2018-08-15 DIAGNOSIS — Z1389 Encounter for screening for other disorder: Secondary | ICD-10-CM | POA: Diagnosis not present

## 2018-08-15 DIAGNOSIS — G894 Chronic pain syndrome: Secondary | ICD-10-CM | POA: Diagnosis not present

## 2018-08-15 DIAGNOSIS — Z6821 Body mass index (BMI) 21.0-21.9, adult: Secondary | ICD-10-CM | POA: Diagnosis not present

## 2018-08-15 DIAGNOSIS — F419 Anxiety disorder, unspecified: Secondary | ICD-10-CM | POA: Diagnosis not present

## 2018-09-06 ENCOUNTER — Other Ambulatory Visit: Payer: Self-pay

## 2018-09-06 ENCOUNTER — Encounter (HOSPITAL_COMMUNITY): Payer: Self-pay | Admitting: Psychiatry

## 2018-09-06 ENCOUNTER — Ambulatory Visit (INDEPENDENT_AMBULATORY_CARE_PROVIDER_SITE_OTHER): Payer: Medicare Other | Admitting: Psychiatry

## 2018-09-06 DIAGNOSIS — F331 Major depressive disorder, recurrent, moderate: Secondary | ICD-10-CM | POA: Diagnosis not present

## 2018-09-06 MED ORDER — VENLAFAXINE HCL ER 150 MG PO CP24: ORAL_CAPSULE | ORAL | 0 refills | Status: DC

## 2018-09-06 MED ORDER — MIRTAZAPINE 15 MG PO TABS: 15.0000 mg | ORAL_TABLET | Freq: Every day | ORAL | 0 refills | Status: DC

## 2018-09-06 MED ORDER — VENLAFAXINE HCL ER 75 MG PO CP24: ORAL_CAPSULE | ORAL | 0 refills | Status: DC

## 2018-09-06 NOTE — Progress Notes (Signed)
Virtual Visit via Telephone Note  I connected with [name] on 09/06/18 at  2:40 PM EDT by telephone and verified that I am speaking with the correct person using two identifiers.   I discussed the limitations, risks, security and privacy concerns of performing an evaluation and management service by telephone and the availability of in person appointments. I also discussed with the patient that there may be a patient responsible charge related to this service. The patient expressed understanding and agreed to proceed.      I discussed the assessment and treatment plan with the patient. The patient was provided an opportunity to ask questions and all were answered. The patient agreed with the plan and demonstrated an understanding of the instructions.   The patient was advised to call back or seek an in-person evaluation if the symptoms worsen or if the condition fails to improve as anticipated.  I provided 15 minutes of non-face-to-face time during this encounter.   Norman Clay, MD    [facility] MD/PA/NP OP Progress Note  09/06/2018 3:10 PM Marilyn Haas  MRN:  [id number]  Chief Complaint:  Chief Complaint    Depression; Follow-up     HPI:  This is a follow-up appointment for depression.  She states that she has not been feeling well.  She feels that she is having panic attack in the movement she received this call. She tends to stay in the house most of the time due to pandemic. Her son in [place] visits the patient to groceries.  She contacts with him often.  She does not contact with the other son, who lives in [place].  He is having some issues for custody.  She tearfully describes that she has not been able to sleep.  She has not uptitrated mirtazapine as it has not been helpful for insomnia.  She understands that this medication is also for depression and anxiety, is willing to try higher dose.  Although she was contemplative about seeing a therapist, she agreed to  see the one if that will be helpful for the patient.  Although she used to enjoy cooking for her husband, she has not been able to do it due to fatigue. She may try dining out. She has fair concentration and appetite.  She has anhedonia.  She denies SI.  She feels anxious and tense at times.  She denies panic attacks.    Visit Diagnosis:    ICD-10-CM   1. MDD (major depressive disorder), recurrent episode, moderate ([place])  F33.1     Past Psychiatric History: Please see initial evaluation for full details. I have reviewed the history. No updates at this time.     Past Medical History:  Past Medical History:  Diagnosis Date  . Anemia   . Anxiety   . Arthritis   . Breast cancer ([place]) 05/19/2016   Right breast  . Childhood asthma   . Chronic back pain    Scoliosis, stenosis  . Depression   . Diverticulosis   . GERD (gastroesophageal reflux disease)   . Hard of hearing   . Headache   . History of blood transfusion   . History of colon polyps   . History of hiatal hernia   . History of pneumonia   . Insomnia   . Osteopenia   . Osteoporosis   . Personal history of radiation therapy   . Thickened endometrium 09/29/2017    Past Surgical History:  Procedure Laterality Date  . BREAST LUMPECTOMY WITH  RADIOACTIVE SEED AND SENTINEL LYMPH NODE BIOPSY Right 07/31/2016   Procedure: BREAST LUMPECTOMY WITH RADIOACTIVE SEED AND SENTINEL LYMPH NODE BIOPSY, INJECT BLUE DYE RIGHT BREAST;  Surgeon: Fanny Skates, MD;  Location: [facility];  Service: General;  Laterality: Right;  . COLONOSCOPY    . 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 ESOPHAGEAL DILATION N/A 09/24/2017   Procedure: ESOPHAGEAL DILATION;  Surgeon: Rogene Houston, MD;  [facility];  Service: Endoscopy;  Laterality: N/A;  . ESOPHAGOGASTRODUODENOSCOPY N/A 01/26/2014    Procedure: ESOPHAGOGASTRODUODENOSCOPY (EGD);  Surgeon: Rogene Houston, MD;  [facility];  Service: Endoscopy;  Laterality: N/A;  1030  . ESOPHAGOGASTRODUODENOSCOPY (EGD) WITH PROPOFOL N/A 09/24/2017   Procedure: ESOPHAGOGASTRODUODENOSCOPY (EGD) WITH PROPOFOL;  Surgeon: Rogene Houston, MD;  [facility];  Service: Endoscopy;  Laterality: N/A;  11:15  . HIP FRACTURE SURGERY     05/2009 left -[facility] Camel  . HYSTEROSCOPY W/D&C  05/24/2018   Procedure: DILATATION AND CURETTAGE /HYSTEROSCOPY (PROCEDURE # 2)/PAP SMEAR (PROCEDURE #1);  Surgeon: Jonnie Kind, MD;  Location: [facility];  Service: Gynecology;;  . Venia Minks DILATION N/A 01/26/2014   Procedure: Keturah Shavers;  Surgeon: Rogene Houston, MD;  [facility];  Service: Endoscopy;  Laterality: N/A;    Family Psychiatric History: Please see initial evaluation for full details. I have reviewed the history. No updates at this time.     Family History:  Family History  Problem Relation Age of Onset  . Heart failure Mother   . Diabetes Mother   . Hypertension Mother   . Depression Mother   . Other Father        stomach ulcers; abd aneursym  . Hypertension Sister   . Heart failure Sister   . Diabetes Sister   . Depression Sister   . Diabetes Sister        prediabetes  . Cancer Maternal Aunt        breast    Social History:  Social History   Socioeconomic History  . Marital status: Married    Spouse name: Not on file  . Number of children: 3  . Years of education: Not on file  . Highest education level: Not on file  Occupational History    Employer: RETIRED  Social Needs  . Financial resource strain: Not on file  . Food insecurity    Worry: Not on file    Inability: Not on file  . Transportation needs    Medical: Not on file    Non-medical: Not on file  Tobacco Use  . Smoking status: Former Smoker    Packs/day: 0.50    Years: 2.00    Pack years: 1.00    Types: Cigarettes    Quit date:  01/15/1961    Years since quitting: 57.6  . Smokeless tobacco: Never Used  Substance and Sexual Activity  . Alcohol use: No    Alcohol/week: 0.0 standard drinks  . Drug use: No  . Sexual activity: Not Currently    Birth control/protection: Post-menopausal  Lifestyle  . Physical activity    Days per week: Not on file    Minutes per session: Not on file  . Stress: Not on file  Relationships  . Social Herbalist on phone: Not on file    Gets together: Not  on file    Attends religious service: Not on file    Active member of club or organization: Not on file    Attends meetings of clubs or organizations: Not on file    Relationship status: Not on file  Other Topics Concern  . Not on file  Social History Narrative  . Not on file    Allergies:  Allergies  Allergen Reactions  . Avelox [Moxifloxacin Hcl In Nacl] Other (See Comments)    Altered mental status    Metabolic Disorder Labs: Lab Results  Component Value Date   HGBA1C 5.4 02/17/2013   MPG 108 02/17/2013   No results found for: PROLACTIN Lab Results  Component Value Date   CHOL 209 (H) 12/13/2014   TRIG 95 12/13/2014   HDL 114 12/13/2014   CHOLHDL 1.8 12/13/2014   VLDL 19 12/13/2014   LDLCALC 76 12/13/2014   LDLCALC 88 02/17/2013   Lab Results  Component Value Date   TSH 1.97 04/11/2018    Therapeutic Level Labs: No results found for: LITHIUM No results found for: VALPROATE No components found for:  CBMZ  Current Medications: Current Outpatient Medications  Medication Sig Dispense Refill  . ALPRAZolam (XANAX) 0.25 MG tablet Take 0.25 mg by mouth at bedtime.    Marland Kitchen atorvastatin (LIPITOR) 10 MG tablet Take 10 mg by mouth daily.    . cyanocobalamin (,VITAMIN B-12,) 1000 MCG/ML injection Inject 1,000 mcg into the muscle every 30 (thirty) days.    Marland Kitchen docusate sodium (COLACE) 100 MG capsule Take 2 capsules (200 mg total) by mouth daily. (Patient taking differently: Take 200-300 mg by mouth daily. )  10 capsule 0  . famotidine (PEPCID) 20 MG tablet TAKE 1 TABLET (20 MG TOTAL) BY MOUTH TWICE A DAY 60 tablet 11  . ferrous sulfate 325 (65 FE) MG tablet Take 325 mg by mouth daily with breakfast.    . gabapentin (NEURONTIN) 100 MG capsule Take 1 capsule (100 mg total) by mouth at bedtime. 30 capsule 3  . HYDROcodone-acetaminophen (NORCO) 10-325 MG tablet Take 1 tablet by mouth 3 (three) times daily as needed for moderate pain.     . mirtazapine (REMERON) 15 MG tablet Take 1 tablet (15 mg total) by mouth at bedtime. 90 tablet 0  . pantoprazole (PROTONIX) 40 MG tablet TAKE ONE (1) TABLET EACH DAY 30 tablet 3  . Polyethyl Glycol-Propyl Glycol (SYSTANE OP) Place 1 drop into both eyes daily as needed (for dry eyes).    . polyethylene glycol powder (GLYCOLAX/MIRALAX) powder Take 8.5 g by mouth daily. (Patient taking differently: Take 17 g by mouth daily as needed for moderate constipation. ) 255 g 0  . tamoxifen (NOLVADEX) 20 MG tablet Take 1 tablet (20 mg total) by mouth daily. (Patient taking differently: Take 20 mg by mouth at bedtime. ) 90 tablet 2  . venlafaxine XR (EFFEXOR-XR) 150 MG 24 hr capsule 225 mg daily (150 mg + 75 mg) 90 capsule 0  . venlafaxine XR (EFFEXOR-XR) 75 MG 24 hr capsule 225 mg daily (150 mg + 75 mg) 90 capsule 0  . zolpidem (AMBIEN) 5 MG tablet Take 5 mg by mouth at bedtime as needed.     No current facility-administered medications for this visit.      Musculoskeletal: Strength & Muscle Tone: N/A Gait & Station: N/A Patient leans: N/A  Psychiatric Specialty Exam: Review of Systems  Psychiatric/Behavioral: Positive for depression. Negative for hallucinations, memory loss, substance abuse and suicidal ideas. The patient is nervous/anxious  and has insomnia.   All other systems reviewed and are negative.   There were no vitals taken for this visit.There is no height or weight on file to calculate BMI.  General Appearance: NA  Eye Contact:  NA  Speech:  Clear and  Coherent  Volume:  Normal  Mood:  Depressed  Affect:  NA  Thought Process:  Coherent  Orientation:  Full (Time, Place, and Person)  Thought Content: Logical   Suicidal Thoughts:  No  Homicidal Thoughts:  No  Memory:  Immediate;   Good  Judgement:  Good  Insight:  Fair  Psychomotor Activity:  Normal  Concentration:  Concentration: Good and Attention Span: Good  Recall:  Good  Fund of Knowledge: Good  Language: Good  Akathisia:  No  Handed:  Right  AIMS (if indicated): not done  Assets:  Communication Skills Desire for Improvement  ADL's:  Intact  Cognition: WNL  Sleep:  Poor   Screenings: PHQ2-9     Office Visit from 09/23/2017 in [facility] Follow Up  from 12/08/2016 in [place] from 08/13/2016 in [facility]  PHQ-2 Total Score  6  1  1   PHQ-9 Total Score  24  -  -       Assessment and Plan:  [name] is a 78 y.o. year old female with a history of depression, esophageal dysphasia, weight loss, history of breast cancer on tamoxifen s/p lumpectomy, radiation, currently on anastrozole, who presents for follow up appointment for depression.   # MDD, moderate, recurrent without psychotic features She continues to report depressive symptoms since the last visit.  Psychosocial stressors includes grief of loss of her husband from pancreatic cancer, and her sister with medical condition.  She has not uptitrated mirtazapine; will do up titration of mirtazapine to target depression, anxiety and insomnia.  Discussed potential side effect of drowsiness and metabolic side effect.  Will continue venlafaxine to target depression.  She will greatly benefit from supportive therapy/CBT; will make referral.   Plan 1.ContinueEffexor 225 mg daily  2. Increase mirtazapine 15 mg at night  3. Next appointment: 8/18 at 1 PM for 20 mins, phone 4TSH reviewed; wnl - Referral to therapy (she is not  on xanax anymore per patient report) - xanax 0.25 mg BID, Ambien 5 mg   Past trials of medication:lexapro, duloxetine, quetiapine (drowsiness)  The patient demonstrates the following risk factors for suicide: Chronic risk factors for suicide include:psychiatric disorder ofdepression. Acute risk factorsfor suicide include: unemployment, Theme park manager and loss (financial, interpersonal, professional). Protective factorsfor this patient include: positive social support, coping skills and hope for the future. Considering these factors, the overall suicide risk at this point appears to below. Patientisappropriate for outpatient follow up.   Norman Clay, MD 09/06/2018, 3:10 PM

## 2018-09-06 NOTE — Patient Instructions (Signed)
1.ContinueEffexor 225 mg daily  2. Increase mirtazapine 15 mg at night  3. Next appointment: 8/18 at 1 PM

## 2018-09-07 ENCOUNTER — Other Ambulatory Visit: Payer: Self-pay

## 2018-09-07 MED ORDER — GABAPENTIN 100 MG PO CAPS: 100.0000 mg | ORAL_CAPSULE | Freq: Every day | ORAL | 3 refills | Status: DC

## 2018-09-12 DIAGNOSIS — F419 Anxiety disorder, unspecified: Secondary | ICD-10-CM | POA: Diagnosis not present

## 2018-09-12 DIAGNOSIS — G894 Chronic pain syndrome: Secondary | ICD-10-CM | POA: Diagnosis not present

## 2018-09-12 DIAGNOSIS — Z6823 Body mass index (BMI) 23.0-23.9, adult: Secondary | ICD-10-CM | POA: Diagnosis not present

## 2018-09-12 DIAGNOSIS — F5101 Primary insomnia: Secondary | ICD-10-CM | POA: Diagnosis not present

## 2018-09-28 ENCOUNTER — Telehealth: Payer: Self-pay | Admitting: *Deleted

## 2018-09-28 NOTE — Telephone Encounter (Signed)
Received call from pt stating she was returning a call about appt tomorrow.  Something about being virtual.  She is OK with this & may call her back @ [phone].

## 2018-09-29 ENCOUNTER — Inpatient Hospital Stay: Payer: Medicare Other | Attending: Oncology

## 2018-09-29 ENCOUNTER — Inpatient Hospital Stay (HOSPITAL_BASED_OUTPATIENT_CLINIC_OR_DEPARTMENT_OTHER): Payer: Medicare Other | Admitting: Oncology

## 2018-09-29 DIAGNOSIS — Z87891 Personal history of nicotine dependence: Secondary | ICD-10-CM

## 2018-09-29 DIAGNOSIS — Z923 Personal history of irradiation: Secondary | ICD-10-CM | POA: Diagnosis not present

## 2018-09-29 DIAGNOSIS — Z79810 Long term (current) use of selective estrogen receptor modulators (SERMs): Secondary | ICD-10-CM | POA: Diagnosis not present

## 2018-09-29 DIAGNOSIS — Z170 Estrogen receptor positive status [ER+]: Secondary | ICD-10-CM | POA: Diagnosis not present

## 2018-09-29 DIAGNOSIS — C50411 Malignant neoplasm of upper-outer quadrant of right female breast: Secondary | ICD-10-CM | POA: Diagnosis not present

## 2018-09-29 NOTE — Progress Notes (Signed)
[facility]  [phone] Fax:([phone]     ID: Marilyn Haas DOB: [birth year]  MR#: [id number]  MWU#:[id number]  Patient Care Team: Elfredia Nevins, MD as PCP - General (Internal Medicine) Jonelle Sidle, MD as PCP - Cardiology (Cardiology) Messiyah Waterson, Valentino Hue, MD as Consulting Physician (Oncology) Claud Kelp, MD as Consulting Physician (General Surgery) Dorothy Puffer, MD as Consulting Physician (Radiation Oncology) Malissa Hippo, MD as Consulting Physician (Gastroenterology) Ollen Gross, MD as Consulting Physician (Orthopedic Surgery) Axel Filler, Larna Daughters, NP as Nurse Practitioner (Hematology and Oncology) Neysa Hotter, MD as Consulting Physician (Psychiatry) Tilda Burrow, MD as Consulting Physician (Obstetrics and Gynecology) OTHER MD:  CHIEF COMPLAINT: Estrogen receptor positive lobular breast cancer  CURRENT TREATMENT: tamoxifen   INTERVAL HISTORY: Marilyn Haas was contacted today for follow-up and treatment of her estrogen receptor positive breast cancer.   She continues on Tamoxifen with good tolerance. She reports occasional hot flashes, but they are not an issue. She reports vaginal wetness and wears a pad.  Since her last visit, she underwent dilatation and curettage on 05/24/2018 under Dr. Emelda Fear. Pathology from the procedure ([id number]) showed benign endometrial polyp tissue.  Her most recent mammogram was at [facility] on 06/15/2017. An order for this, as well as a bone density test, was placed at her last visit, but neither test appears to be scheduled. She has not felt safe going out to have these done.   REVIEW OF SYSTEMS: Marilyn Haas reports she does all her own shopping. She lives by herself since her husband passed away. She states her son Marilyn Haas helps her often. She lives in a 3-story house (including the basement), so her main exercise is walking around her house and in her yard. A detailed review of  systems was otherwise entirely negative.    BREAST CANCER HISTORY: From the original intake note:  The patient had bilateral screening mammography at the [facility] 04/22/2016 showing an area of possible asymmetry and calcifications in the right breast. Right diagnostic mammography with tomography and right breast ultrasonography 05/12/2016 showed the breast density to be category C. In the right breast upper outer quadrant there was a. A real lower area of architectural distortion which was palpable in the 11:00 radians 1 cm from the nipple. Ultrasonography confirmed an irregular hypoechoic mass at this location measuring 1.4 cm. Ultrasound of the right axilla was unremarkable.  Biopsy of the right breast mass in question 05/19/2016 showed ([id number]) and invasive lobular carcinoma, E-cadherin negative, estrogen receptor 90% positive, progesterone receptor 15% positive, both with strong staining intensity, with an MIB-1 of 5%, and no HER-2 amplification, the signals ratio being 1.51 and the number per cell 3.10.  Breast MRI 06/08/2016 confirmed an irregular enhancing mass in the upper-outer quadrant of the right breast measuring 1.5 cm, with adjacent biopsy clip artifact. There was an area of contiguous non-mass like enhancement bringing the total dimension to 1.9 cm. However there was no evidence of multicentric disease and no evidence of metastatic lymphadenopathy.  The patient's subsequent history is as detailed below   PAST MEDICAL HISTORY: Past Medical History:  Diagnosis Date  . Anemia   . Anxiety   . Arthritis   . Breast cancer (HCC) 05/19/2016   Right breast  . Childhood asthma   . Chronic back pain    Scoliosis, stenosis  . Depression   . Diverticulosis   . GERD (gastroesophageal reflux disease)   . Hard of hearing   . Headache   .  History of blood transfusion   . History of colon polyps   . History of hiatal hernia   . History of pneumonia   . Insomnia   .  Osteopenia   . Osteoporosis   . Personal history of radiation therapy   . Thickened endometrium 09/29/2017    PAST SURGICAL HISTORY: Past Surgical History:  Procedure Laterality Date  . BREAST LUMPECTOMY WITH RADIOACTIVE SEED AND SENTINEL LYMPH NODE BIOPSY Right 07/31/2016   Procedure: BREAST LUMPECTOMY WITH RADIOACTIVE SEED AND SENTINEL LYMPH NODE BIOPSY, INJECT BLUE DYE RIGHT BREAST;  Surgeon: Claud Kelp, MD;  Location: [facility] OR;  Service: General;  Laterality: Right;  . COLONOSCOPY    . 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 07/04/2014   Procedure: LEFT CONVERSION TO TOTAL HIP ARTHROPLASTY;  Surgeon: Ollen Gross, MD;  Location: [facility] ORS;  Service: Orthopedics;  Laterality: Left;  . ESOPHAGEAL DILATION N/A 09/24/2017   Procedure: ESOPHAGEAL DILATION;  Surgeon: Malissa Hippo, MD;  Location: [facility] END[facility] SUITE;  Service: Endoscopy;  Laterality: N/A;  . ESOPHAGOGASTRODUODENOSCOPY N/A 01/26/2014   Procedure: ESOPHAGOGASTRODUODENOSCOPY (EGD);  Surgeon: Malissa Hippo, MD;  Location: [facility] SUITE;  Service: Endoscopy;  Laterality: N/A;  1030  . ESOPHAGOGASTRODUODENOSCOPY (EGD) WITH PROPOFOL N/A 09/24/2017   Procedure: ESOPHAGOGASTRODUODENOSCOPY (EGD) WITH PROPOFOL;  Surgeon: Malissa Hippo, MD;  Location: [facility];  Service: Endoscopy;  Laterality: N/A;  11:15  . HIP FRACTURE SURGERY     05/2009 left -Turner Daniels  . HYSTEROSCOPY W/D&C  05/24/2018   Procedure: DILATATION AND CURETTAGE /HYSTEROSCOPY (PROCEDURE # 2)/PAP SMEAR (PROCEDURE #1);  Surgeon: Tilda Burrow, MD;  Location: [facility] ORS;  Service: Gynecology;;  . [name] DILATION N/A 01/26/2014   Procedure: Alvy Beal;  Surgeon: Malissa Hippo, MD;  Location: [facility];  Service: Endoscopy;  Laterality: N/A;    FAMILY HISTORY Family History  Problem Relation Age of Onset  . Heart failure Mother   . Diabetes Mother   . Hypertension Mother   . Depression Mother   . Other Father        stomach ulcers; abd aneursym  . Hypertension Sister    . Heart failure Sister   . Diabetes Sister   . Depression Sister   . Diabetes Sister        prediabetes  . Cancer Maternal Aunt        breast  The patient's father died at age 51 from rupture of an abdominal aortic aneurysm. The patient's mother died with heart failure in the setting of diabetes at age 52. The patient had no brothers, 2 sisters. One sister was diagnosed with breast cancer at age 17. Her other sister has had complications from diabetes. One maternal aunt was diagnosed with breast cancer in her 87s   GYNECOLOGIC HISTORY:  No LMP recorded. Patient is postmenopausal. Menarche age 58, first live birth age 58, which the patient is aware increases the risk of breast cancer. She is GX P3. She does not quite recall when she went through the change of life but thinks he was in her 39s. She did not use hormone replacement.   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 Her husband "Marilyn Cella" Haas, 83, was in Airline pilot. He passed away in Jul 21, 2018due to cancer and various other medical problems. Son Marilyn Haas lives in [place] where he works in Armed forces training and education officer. Son Marilyn Haas lives in [place] and works in Magazine features editor. Son Marilyn Haas lives in [place] and works as a Set designer  Curator. The patient has 12 grandchildren and 2 great-grandchildren. She is a Control and instrumentation engineer.    ADVANCED DIRECTIVES: Not addressed at 06/22/2016 meeting   HEALTH MAINTENANCE: Social History   Tobacco Use  . Smoking status: Former Smoker    Packs/day: 0.50    Years: 2.00    Pack years: 1.00    Types: Cigarettes    Quit date: 01/15/1961    Years since quitting: 57.7  . Smokeless tobacco: Never Used  Substance Use Topics  . Alcohol use: No    Alcohol/week: 0.0 standard drinks  . Drug use: No     Colonoscopy: UTD/Rehman  PAP: 05/24/2018, normal  Bone density: 04/22/2016   Allergies  Allergen Reactions  . Avelox [Moxifloxacin Hcl In Nacl] Other (See Comments)    Altered mental status     Current Outpatient Medications  Medication Sig Dispense Refill  . ALPRAZolam (XANAX) 0.25 MG tablet Take 0.25 mg by mouth at bedtime.    Marland Kitchen atorvastatin (LIPITOR) 10 MG tablet Take 10 mg by mouth daily.    . cyanocobalamin (,VITAMIN B-12,) 1000 MCG/ML injection Inject 1,000 mcg into the muscle every 30 (thirty) days.    Marland Kitchen docusate sodium (COLACE) 100 MG capsule Take 2 capsules (200 mg total) by mouth daily. (Patient taking differently: Take 200-300 mg by mouth daily. ) 10 capsule 0  . famotidine (PEPCID) 20 MG tablet TAKE 1 TABLET (20 MG TOTAL) BY MOUTH TWICE A DAY 60 tablet 11  . ferrous sulfate 325 (65 FE) MG tablet Take 325 mg by mouth daily with breakfast.    . gabapentin (NEURONTIN) 100 MG capsule Take 1 capsule (100 mg total) by mouth at bedtime. 30 capsule 3  . HYDROcodone-acetaminophen (NORCO) 10-325 MG tablet Take 1 tablet by mouth 3 (three) times daily as needed for moderate pain.     . mirtazapine (REMERON) 15 MG tablet Take 1 tablet (15 mg total) by mouth at bedtime. 90 tablet 0  . pantoprazole (PROTONIX) 40 MG tablet TAKE ONE (1) TABLET EACH DAY 30 tablet 3  . Polyethyl Glycol-Propyl Glycol (SYSTANE OP) Place 1 drop into both eyes daily as needed (for dry eyes).    . polyethylene glycol powder (GLYCOLAX/MIRALAX) powder Take 8.5 g by mouth daily. (Patient taking differently: Take 17 g by mouth daily as needed for moderate constipation. ) 255 g 0  . tamoxifen (NOLVADEX) 20 MG tablet Take 1 tablet (20 mg total) by mouth daily. (Patient taking differently: Take 20 mg by mouth at bedtime. ) 90 tablet 2  . venlafaxine XR (EFFEXOR-XR) 150 MG 24 hr capsule 225 mg daily (150 mg + 75 mg) 90 capsule 0  . venlafaxine XR (EFFEXOR-XR) 75 MG 24 hr capsule 225 mg daily (150 mg + 75 mg) 90 capsule 0  . zolpidem (AMBIEN) 5 MG tablet Take 5 mg by mouth at bedtime as needed.     No current facility-administered medications for this visit.     OBJECTIVE: Older white woman in no acute  distress  There were no vitals filed for this visit.   There is no height or weight on file to calculate BMI.    ECOG FS:1 - Symptomatic but completely ambulatory  LAB RESULTS:  CMP     Component Value Date/Time   NA 142 03/23/2018 1133   NA 139 12/21/2016 0931   K 4.1 03/23/2018 1133   K 4.2 12/21/2016 0931   CL 106 03/23/2018 1133   CO2 28 03/23/2018 1133   CO2 28 12/21/2016 0931  GLUCOSE 88 03/23/2018 1133   GLUCOSE 86 12/21/2016 0931   BUN 15 03/23/2018 1133   BUN 14.1 12/21/2016 0931   CREATININE 0.89 03/23/2018 1133   CREATININE 0.9 12/21/2016 0931   CALCIUM 8.7 (L) 03/23/2018 1133   CALCIUM 8.8 12/21/2016 0931   PROT 6.8 03/23/2018 1133   PROT 7.0 12/21/2016 0931   ALBUMIN 3.6 03/23/2018 1133   ALBUMIN 3.7 12/21/2016 0931   AST 12 (L) 03/23/2018 1133   AST 15 12/21/2016 0931   ALT 10 03/23/2018 1133   ALT 8 12/21/2016 0931   ALKPHOS 62 03/23/2018 1133   ALKPHOS 62 12/21/2016 0931   BILITOT 0.5 03/23/2018 1133   BILITOT 0.40 12/21/2016 0931   GFRNONAA >60 03/23/2018 1133   GFRAA >60 03/23/2018 1133    No results found for: Dorene Ar, A1GS, A2GS, BETS, BETA2SER, GAMS, MSPIKE, SPEI  No results found for: Ron Parker, [facility]  Lab Results  Component Value Date   WBC 6.9 05/17/2018   NEUTROABS 4.6 03/23/2018   HGB 11.9 (L) 05/17/2018   HCT 39.1 05/17/2018   MCV 98.5 05/17/2018   PLT 251 05/17/2018      Chemistry      Component Value Date/Time   NA 142 03/23/2018 1133   NA 139 12/21/2016 0931   K 4.1 03/23/2018 1133   K 4.2 12/21/2016 0931   CL 106 03/23/2018 1133   CO2 28 03/23/2018 1133   CO2 28 12/21/2016 0931   BUN 15 03/23/2018 1133   BUN 14.1 12/21/2016 0931   CREATININE 0.89 03/23/2018 1133   CREATININE 0.9 12/21/2016 0931      Component Value Date/Time   CALCIUM 8.7 (L) 03/23/2018 1133   CALCIUM 8.8 12/21/2016 0931   ALKPHOS 62 03/23/2018 1133   ALKPHOS 62 12/21/2016 0931   AST 12 (L) 03/23/2018 1133    AST 15 12/21/2016 0931   ALT 10 03/23/2018 1133   ALT 8 12/21/2016 0931   BILITOT 0.5 03/23/2018 1133   BILITOT 0.40 12/21/2016 0931       No results found for: LABCA2  No components found for: [id number]  No results for input(s): INR in the last 168 hours.  Urinalysis    Component Value Date/Time   COLORURINE YELLOW 05/17/2018 1314   APPEARANCEUR HAZY (A) 05/17/2018 1314   LABSPEC 1.017 05/17/2018 1314   PHURINE 5.0 05/17/2018 1314   GLUCOSEU NEGATIVE 05/17/2018 1314   HGBUR SMALL (A) 05/17/2018 1314   BILIRUBINUR NEGATIVE 05/17/2018 1314   KETONESUR NEGATIVE 05/17/2018 1314   PROTEINUR NEGATIVE 05/17/2018 1314   UROBILINOGEN 1.0 06/26/2014 1400   NITRITE NEGATIVE 05/17/2018 1314   LEUKOCYTESUR NEGATIVE 05/17/2018 1314     STUDIES: Outside studies reviewed  ELIGIBLE FOR AVAILABLE RESEARCH PROTOCOL: no  ASSESSMENT: 78 y.o. [place] woman status Haas right breast upper outer quadrant biopsy 05/19/2016 for a clinical T1c N0, stage IA invasive lobular breast cancer, estrogen and progesterone receptor positive, HER-2 nonamplified, with an MIB-15%.   (1) Status Haas right lumpectomy and sentinel lymph node sampling 07/31/2016 for a pT2 pN0, stage IB invasive lobular carcinoma, grade 2, with negative margins  (2) adjuvant radiation  09/01/2016 to 09/29/2016 Site/dose:    1. The Right breast was treated to 42.5 Gy in 17 fractions at 2.5 Gy per fraction. 2. The Right breast was boosted to 7.5 Gy in 3 fractions at 2.5 Gy per fraction.   (3)  started tamoxifen 10/26/2016  (a) bone density 04/22/2016 shows a T score in the right femur of -  2.0, right radius -2.4   (b) switched to anastrozole August 2019 because of concerns for vaginal wetness and hot flashes with tamoxifen  (c) switched back to tamoxifen June 2019 as the patient felt there was no improvement in prior issues and she would like to "strengthen her bones".  PLAN:  Marilyn Haas is now just over 2 years out from  definitive surgery for her breast cancer with no evidence of disease recurrence.  This is very favorable.  She is tolerating tamoxifen generally well.  She does have some vaginal wetness which she is taking care of.  She prefers this to the dryness that she was experiencing with anastrozole.  She is taking appropriate pandemic precautions and keeping herself fit.  She has not yet had mammography this year.  I think in her case it is safer to simply wait since she has had no change in her breasts that she is aware of and of course she has a generally very good prognosis.  We have data that if mammograms are pushed back to every 2 years there is very minimal change in mortality  Tentatively I am making her a return appointment in March of next year.  She knows to call for any other issues that may develop before then.  Jquan Egelston, Valentino Hue, MD  09/29/18 2:22 PM Medical Oncology and Hematology [facility] [address] [place], [place] [address] Tel. [phone]    Fax. [phone]   I, Mickie Bail, am acting as scribe for Dr. Valentino Hue. Takeela Peil.  I, Ruthann Cancer MD, have reviewed the above documentation for accuracy and completeness, and I agree with the above.

## 2018-10-10 DIAGNOSIS — F419 Anxiety disorder, unspecified: Secondary | ICD-10-CM | POA: Diagnosis not present

## 2018-10-10 DIAGNOSIS — G894 Chronic pain syndrome: Secondary | ICD-10-CM | POA: Diagnosis not present

## 2018-10-10 DIAGNOSIS — F5101 Primary insomnia: Secondary | ICD-10-CM | POA: Diagnosis not present

## 2018-10-10 DIAGNOSIS — Z6821 Body mass index (BMI) 21.0-21.9, adult: Secondary | ICD-10-CM | POA: Diagnosis not present

## 2018-10-17 ENCOUNTER — Ambulatory Visit (INDEPENDENT_AMBULATORY_CARE_PROVIDER_SITE_OTHER): Payer: Medicare Other | Admitting: Internal Medicine

## 2018-10-26 NOTE — Progress Notes (Signed)
Virtual Visit via Video Note  I connected with Marilyn Haas on 11/01/18 at  1:00 PM EDT by a video enabled telemedicine application and verified that I am speaking with the correct person using two identifiers.   I discussed the limitations of evaluation and management by telemedicine and the availability of in person appointments. The patient expressed understanding and agreed to proceed.     I discussed the assessment and treatment plan with the patient. The patient was provided an opportunity to ask questions and all were answered. The patient agreed with the plan and demonstrated an understanding of the instructions.   The patient was advised to call back or seek an in-person evaluation if the symptoms worsen or if the condition fails to improve as anticipated.  I provided 25 minutes of non-face-to-face time during this encounter.   Norman Clay, MD    [facility] MD/PA/NP OP Progress Note  11/01/2018 3:42 PM Marilyn Haas  MRN:  [id number]  Chief Complaint:  Chief Complaint    Follow-up; Depression     HPI:  This is a follow-up appointment for depression.  This encounter is done with the presence of her son, Marilyn Haas, who helped to elaborate the story. The patient agrees for him to be present during the interview.   Marilyn Haas states that she continues to feel depressed.  She has not been taking mirtazapine every day as she still struggles with insomnia. She asks whether she can take Xanax while she takes mirtazapine. (she repeated this question a few times despite she is provided psycho education, which was also discussed at her last visit).   She makes Marilyn Haas answer the majority of the questions. She has initial insomnia. She feels fatigue. She has difficulty in concentration. She has fair appetite. She denies SI, HI, AH, VH. She feels anxious., tense.   Marilyn Haas states that he is concerned as she is doing hoarding even when her father was alive. Marilyn Haas meets with her a few times per  month. Although Marilyn Haas believes that she would need a therapy, Marilyn Haas is not willing to do so. She has been depressed.     Of note, he is aware of her dysarthria; she has to get new dentures. She also has hearing loss. She has no known weakness.    Visit Diagnosis:    ICD-10-CM   1. MDD (major depressive disorder), recurrent episode, moderate ([place])  F33.1     Past Psychiatric History: Please see initial evaluation for full details. I have reviewed the history. No updates at this time.     Past Medical History:  Past Medical History:  Diagnosis Date  . Anemia   . Anxiety   . Arthritis   . Breast cancer ([place]) 05/19/2016   Right breast  . Childhood asthma   . Chronic back pain    Scoliosis, stenosis  . Depression   . Diverticulosis   . GERD (gastroesophageal reflux disease)   . Hard of hearing   . Headache   . History of blood transfusion   . History of colon polyps   . History of hiatal hernia   . History of pneumonia   . Insomnia   . Osteopenia   . Osteoporosis   . Personal history of radiation therapy   . Thickened endometrium 09/29/2017    Past Surgical History:  Procedure Laterality Date  . BREAST LUMPECTOMY WITH RADIOACTIVE SEED AND SENTINEL LYMPH NODE BIOPSY Right 07/31/2016   Procedure: BREAST LUMPECTOMY WITH RADIOACTIVE SEED AND SENTINEL LYMPH  NODE BIOPSY, INJECT BLUE DYE RIGHT BREAST;  Surgeon: Fanny Skates, MD;  Location: [facility];  Service: General;  Laterality: Right;  . COLONOSCOPY    . 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 ESOPHAGEAL DILATION N/A 09/24/2017   Procedure: ESOPHAGEAL DILATION;  Surgeon: Rogene Houston, MD;  [facility];  Service: Endoscopy;  Laterality: N/A;  . ESOPHAGOGASTRODUODENOSCOPY N/A 01/26/2014   Procedure: ESOPHAGOGASTRODUODENOSCOPY (EGD);  Surgeon: Rogene Houston, MD;   [facility];  Service: Endoscopy;  Laterality: N/A;  1030  . ESOPHAGOGASTRODUODENOSCOPY (EGD) WITH PROPOFOL N/A 09/24/2017   Procedure: ESOPHAGOGASTRODUODENOSCOPY (EGD) WITH PROPOFOL;  Surgeon: Rogene Houston, MD;  [facility];  Service: Endoscopy;  Laterality: N/A;  11:15  . HIP FRACTURE SURGERY     05/2009 left -[facility] Camel  . HYSTEROSCOPY W/D&C  05/24/2018   Procedure: DILATATION AND CURETTAGE /HYSTEROSCOPY (PROCEDURE # 2)/PAP SMEAR (PROCEDURE #1);  Surgeon: Jonnie Kind, MD;  Location: [facility];  Service: Gynecology;;  . Venia Minks DILATION N/A 01/26/2014   Procedure: Keturah Shavers;  Surgeon: Rogene Houston, MD;  [facility];  Service: Endoscopy;  Laterality: N/A;    Family Psychiatric History: Please see initial evaluation for full details. I have reviewed the history. No updates at this time.     Family History:  Family History  Problem Relation Age of Onset  . Heart failure Mother   . Diabetes Mother   . Hypertension Mother   . Depression Mother   . Other Father        stomach ulcers; abd aneursym  . Hypertension Sister   . Heart failure Sister   . Diabetes Sister   . Depression Sister   . Diabetes Sister        prediabetes  . Cancer Maternal Aunt        breast    Social History:  Social History   Socioeconomic History  . Marital status: Married    Spouse name: Not on file  . Number of children: 3  . Years of education: Not on file  . Highest education level: Not on file  Occupational History    Employer: RETIRED  Social Needs  . Financial resource strain: Not on file  . Food insecurity    Worry: Not on file    Inability: Not on file  . Transportation needs    Medical: Not on file    Non-medical: Not on file  Tobacco Use  . Smoking status: Former Smoker    Packs/day: 0.50    Years: 2.00    Pack years: 1.00    Types: Cigarettes    Quit date: 01/15/1961    Years since quitting: 57.8  . Smokeless tobacco: Never Used   Substance and Sexual Activity  . Alcohol use: No    Alcohol/week: 0.0 standard drinks  . Drug use: No  . Sexual activity: Not Currently    Birth control/protection: Post-menopausal  Lifestyle  . Physical activity    Days per week: Not on file    Minutes per session: Not on file  . Stress: Not on file  Relationships  . Social Herbalist on phone: Not on file    Gets together: Not on file    Attends religious service: Not on file    Active member of club or organization:  Not on file    Attends meetings of clubs or organizations: Not on file    Relationship status: Not on file  Other Topics Concern  . Not on file  Social History Narrative  . Not on file    Allergies:  Allergies  Allergen Reactions  . Avelox [Moxifloxacin Hcl In Nacl] Other (See Comments)    Altered mental status    Metabolic Disorder Labs: Lab Results  Component Value Date   HGBA1C 5.4 02/17/2013   MPG 108 02/17/2013   No results found for: PROLACTIN Lab Results  Component Value Date   CHOL 209 (H) 12/13/2014   TRIG 95 12/13/2014   HDL 114 12/13/2014   CHOLHDL 1.8 12/13/2014   VLDL 19 12/13/2014   LDLCALC 76 12/13/2014   LDLCALC 88 02/17/2013   Lab Results  Component Value Date   TSH 1.97 04/11/2018    Therapeutic Level Labs: No results found for: LITHIUM No results found for: VALPROATE No components found for:  CBMZ  Current Medications: Current Outpatient Medications  Medication Sig Dispense Refill  . ALPRAZolam (XANAX) 0.25 MG tablet Take 0.25 mg by mouth at bedtime.    Marland Kitchen atorvastatin (LIPITOR) 10 MG tablet Take 10 mg by mouth daily.    . cyanocobalamin (,VITAMIN B-12,) 1000 MCG/ML injection Inject 1,000 mcg into the muscle every 30 (thirty) days.    Marland Kitchen docusate sodium (COLACE) 100 MG capsule Take 2 capsules (200 mg total) by mouth daily. (Patient taking differently: Take 200-300 mg by mouth daily. ) 10 capsule 0  . famotidine (PEPCID) 20 MG tablet TAKE 1 TABLET (20 MG  TOTAL) BY MOUTH TWICE A DAY 60 tablet 11  . ferrous sulfate 325 (65 FE) MG tablet Take 325 mg by mouth daily with breakfast.    . gabapentin (NEURONTIN) 100 MG capsule Take 1 capsule (100 mg total) by mouth at bedtime. 30 capsule 3  . HYDROcodone-acetaminophen (NORCO) 10-325 MG tablet Take 1 tablet by mouth 3 (three) times daily as needed for moderate pain.     Derrill Memo ON 12/06/2018] mirtazapine (REMERON) 15 MG tablet Take 1 tablet (15 mg total) by mouth at bedtime. 90 tablet 0  . pantoprazole (PROTONIX) 40 MG tablet TAKE ONE (1) TABLET EACH DAY 30 tablet 3  . Polyethyl Glycol-Propyl Glycol (SYSTANE OP) Place 1 drop into both eyes daily as needed (for dry eyes).    . polyethylene glycol powder (GLYCOLAX/MIRALAX) powder Take 8.5 g by mouth daily. (Patient taking differently: Take 17 g by mouth daily as needed for moderate constipation. ) 255 g 0  . tamoxifen (NOLVADEX) 20 MG tablet Take 1 tablet (20 mg total) by mouth daily. (Patient taking differently: Take 20 mg by mouth at bedtime. ) 90 tablet 2  . [START ON 12/06/2018] venlafaxine XR (EFFEXOR-XR) 150 MG 24 hr capsule 225 mg daily (150 mg + 75 mg) 90 capsule 0  . [START ON 12/06/2018] venlafaxine XR (EFFEXOR-XR) 75 MG 24 hr capsule 225 mg daily (150 mg + 75 mg) 90 capsule 0  . zolpidem (AMBIEN) 5 MG tablet Take 5 mg by mouth at bedtime as needed.     No current facility-administered medications for this visit.      Musculoskeletal: Strength & Muscle Tone: N/A Gait & Station: N/A Patient leans: N/A  Psychiatric Specialty Exam: Review of Systems  Psychiatric/Behavioral: Positive for depression and memory loss. Negative for hallucinations, substance abuse and suicidal ideas. The patient is nervous/anxious and has insomnia.   All other systems reviewed and  are negative.   There were no vitals taken for this visit.There is no height or weight on file to calculate BMI.  General Appearance: Fairly Groomed  Eye Contact:  Good  Speech:  Garbled   Volume:  Normal  Mood:  Depressed  Affect:  Appropriate, Congruent and Restricted  Thought Process:  Coherent  Orientation:  Full (Time, Place, and Person)  Thought Content: Logical   Suicidal Thoughts:  No  Homicidal Thoughts:  No  Memory:  Immediate;   Poor  Judgement:  Fair  Insight:  Shallow  Psychomotor Activity:  Normal  Concentration:  Concentration: Poor and Attention Span: Poor  Recall:  Good  Fund of Knowledge: Good  Language: Good  Akathisia:  No  Handed:  Right  AIMS (if indicated): not done  Assets:  Communication Skills Desire for Improvement  ADL's:  Intact  Cognition: WNL  Sleep:  Poor   Screenings: PHQ2-9     Office Visit from 09/23/2017 in [facility] Follow Up  from 12/08/2016 in [place] from 08/13/2016 in [facility]  PHQ-2 Total Score  6  1  1   PHQ-9 Total Score  24  -  -       Assessment and Plan:  Marilyn Haas is a 78 y.o. year old female with a history of depression,  esophageal dysphasia, weight loss, history of breast canceron tamoxifens/p lumpectomy, radiation, currently on anastrozole, who presents for follow up appointment for depression.   #MDD, moderate, recurrent without psychotic features She continues to reports depressive symptoms with anxiety since the last visit.  It is notable that she has been nonadherent to medication despite psycho education over the past few months. Her son is also concerned of her hoarding behavior (which is chronic even before the death of her husband).  Psychosocial stressors includes grief of loss of her husband from pancreatic cancer, and her sister with medical condition.  She is strongly advised to take mirtazapine consistently as adjunctive treatment for depression.  Will continue venlafaxine to target depression.  She will greatly benefit from CBT; will make a referral again.   # r/o memory loss She has been non  adherent to medication, which could be attributable to memory loss.  It is difficult to discern etiology of memory loss with active mood symptoms. Will continue to assess. Of note, although she does have dysarthria on today's evaluation, her son states that he is aware of her dysarthria due to dentures. No known weakness to concern for stroke.   Plan 1.ContinueEffexor 225 mg daily 2. Continue mirtazapine 15 mg at night (she is advised to take it consistently) 3.Next appointment:  9/25 at 11:30 for 30 mins, video [phone], her son, Marilyn Haas's phone) [place] reviewed; wnl - Referral to therapy (she is not on xanax anymore per patient report) - xanax 0.25 mg BID,Ambien5 mg   Past trials of medication:lexapro, duloxetine, quetiapine (drowsiness)  The patient demonstrates the following risk factors for suicide: Chronic risk factors for suicide include:psychiatric disorder ofdepression. Acute risk factorsfor suicide include: unemployment, Theme park manager and loss (financial, interpersonal, professional). Protective factorsfor this patient include: positive social support, coping skills and hope for the future. Considering these factors, the overall suicide risk at this point appears to below. Patientisappropriate for outpatient follow up.  The duration of this appointment visit was 25 minutes of non face-to-face time with the patient.  Greater than 50% of this time was spent in counseling,  explanation of  diagnosis, planning of further management, and coordination of care.  Norman Clay, MD 11/01/2018, 3:42 PM

## 2018-11-01 ENCOUNTER — Other Ambulatory Visit: Payer: Self-pay

## 2018-11-01 ENCOUNTER — Encounter (HOSPITAL_COMMUNITY): Payer: Self-pay | Admitting: Psychiatry

## 2018-11-01 ENCOUNTER — Ambulatory Visit (INDEPENDENT_AMBULATORY_CARE_PROVIDER_SITE_OTHER): Payer: Medicare Other | Admitting: Psychiatry

## 2018-11-01 DIAGNOSIS — F331 Major depressive disorder, recurrent, moderate: Secondary | ICD-10-CM

## 2018-11-01 MED ORDER — VENLAFAXINE HCL ER 150 MG PO CP24: ORAL_CAPSULE | ORAL | 0 refills | Status: DC

## 2018-11-01 MED ORDER — MIRTAZAPINE 15 MG PO TABS: 15.0000 mg | ORAL_TABLET | Freq: Every day | ORAL | 0 refills | Status: DC

## 2018-11-01 MED ORDER — VENLAFAXINE HCL ER 75 MG PO CP24: ORAL_CAPSULE | ORAL | 0 refills | Status: DC

## 2018-11-01 NOTE — Patient Instructions (Addendum)
1.ContinueEffexor 225 mg daily 2. Continue mirtazapine 15 mg at night  3.Next appointment: 9/25 at 11:30 for 30 mins, video

## 2018-11-14 DIAGNOSIS — Z6822 Body mass index (BMI) 22.0-22.9, adult: Secondary | ICD-10-CM | POA: Diagnosis not present

## 2018-11-14 DIAGNOSIS — G894 Chronic pain syndrome: Secondary | ICD-10-CM | POA: Diagnosis not present

## 2018-11-14 DIAGNOSIS — F329 Major depressive disorder, single episode, unspecified: Secondary | ICD-10-CM | POA: Diagnosis not present

## 2018-11-14 DIAGNOSIS — Z1389 Encounter for screening for other disorder: Secondary | ICD-10-CM | POA: Diagnosis not present

## 2018-11-14 DIAGNOSIS — Z0001 Encounter for general adult medical examination with abnormal findings: Secondary | ICD-10-CM | POA: Diagnosis not present

## 2018-11-15 DIAGNOSIS — F329 Major depressive disorder, single episode, unspecified: Secondary | ICD-10-CM | POA: Diagnosis not present

## 2018-11-15 DIAGNOSIS — E782 Mixed hyperlipidemia: Secondary | ICD-10-CM | POA: Diagnosis not present

## 2018-11-15 DIAGNOSIS — Z0000 Encounter for general adult medical examination without abnormal findings: Secondary | ICD-10-CM | POA: Diagnosis not present

## 2018-11-15 DIAGNOSIS — Z1389 Encounter for screening for other disorder: Secondary | ICD-10-CM | POA: Diagnosis not present

## 2018-11-15 DIAGNOSIS — Z0001 Encounter for general adult medical examination with abnormal findings: Secondary | ICD-10-CM | POA: Diagnosis not present

## 2018-11-17 ENCOUNTER — Other Ambulatory Visit: Payer: Self-pay

## 2018-11-17 ENCOUNTER — Ambulatory Visit (HOSPITAL_COMMUNITY): Payer: Medicare Other | Admitting: Licensed Clinical Social Worker

## 2018-12-05 ENCOUNTER — Other Ambulatory Visit: Payer: Self-pay

## 2018-12-05 ENCOUNTER — Encounter (HOSPITAL_COMMUNITY): Payer: Self-pay

## 2018-12-05 ENCOUNTER — Emergency Department (HOSPITAL_COMMUNITY)
Admission: EM | Admit: 2018-12-05 | Discharge: 2018-12-05 | Disposition: A | Discharge status: Home / Self Care | Payer: Medicare Other | Attending: Emergency Medicine | Admitting: Emergency Medicine

## 2018-12-05 DIAGNOSIS — Z87891 Personal history of nicotine dependence: Secondary | ICD-10-CM | POA: Insufficient documentation

## 2018-12-05 DIAGNOSIS — R112 Nausea with vomiting, unspecified: Secondary | ICD-10-CM | POA: Diagnosis not present

## 2018-12-05 DIAGNOSIS — I10 Essential (primary) hypertension: Secondary | ICD-10-CM | POA: Diagnosis not present

## 2018-12-05 DIAGNOSIS — E860 Dehydration: Secondary | ICD-10-CM | POA: Diagnosis not present

## 2018-12-05 DIAGNOSIS — Z79899 Other long term (current) drug therapy: Secondary | ICD-10-CM | POA: Diagnosis not present

## 2018-12-05 DIAGNOSIS — Z20828 Contact with and (suspected) exposure to other viral communicable diseases: Secondary | ICD-10-CM | POA: Insufficient documentation

## 2018-12-05 DIAGNOSIS — R9431 Abnormal electrocardiogram [ECG] [EKG]: Secondary | ICD-10-CM | POA: Diagnosis not present

## 2018-12-05 LAB — COMPREHENSIVE METABOLIC PANEL
ALT: 17 U/L (ref 0–44)
AST: 40 U/L (ref 15–41)
Albumin: 4.4 g/dL (ref 3.5–5.0)
Alkaline Phosphatase: 95 U/L (ref 38–126)
Anion gap: 14 (ref 5–15)
BUN: 14 mg/dL (ref 8–23)
CO2: 19 mmol/L — ABNORMAL LOW (ref 22–32)
Calcium: 8.9 mg/dL (ref 8.9–10.3)
Chloride: 97 mmol/L — ABNORMAL LOW (ref 98–111)
Creatinine, Ser: 0.7 mg/dL (ref 0.44–1.00)
GFR calc Af Amer: >60 mL/min (ref >60)
GFR calc non Af Amer: >60 mL/min (ref >60)
Glucose, Bld: 182 mg/dL — ABNORMAL HIGH (ref 70–99)
Potassium: 3.8 mmol/L (ref 3.5–5.1)
Sodium: 130 mmol/L — ABNORMAL LOW (ref 135–145)
Total Bilirubin: 1.2 mg/dL (ref 0.3–1.2)
Total Protein: 8.2 g/dL — ABNORMAL HIGH (ref 6.5–8.1)

## 2018-12-05 LAB — CBC WITH DIFFERENTIAL/PLATELET
Abs Immature Granulocytes: 0.03 10*3/uL (ref 0.00–0.07)
Basophils Absolute: 0 10*3/uL (ref 0.0–0.1)
Basophils Relative: 1 %
Eosinophils Absolute: 0 10*3/uL (ref 0.0–0.5)
Eosinophils Relative: 0 %
HCT: 35.3 % — ABNORMAL LOW (ref 36.0–46.0)
Hemoglobin: 11.4 g/dL — ABNORMAL LOW (ref 12.0–15.0)
Immature Granulocytes: 1 %
Lymphocytes Relative: 9 %
Lymphs Abs: 0.5 10*3/uL — ABNORMAL LOW (ref 0.7–4.0)
MCH: 31.9 pg (ref 26.0–34.0)
MCHC: 32.3 g/dL (ref 30.0–36.0)
MCV: 98.9 fL (ref 80.0–100.0)
Monocytes Absolute: 0.4 10*3/uL (ref 0.1–1.0)
Monocytes Relative: 7 %
Neutro Abs: 4.5 10*3/uL (ref 1.7–7.7)
Neutrophils Relative %: 82 %
Platelets: 219 10*3/uL (ref 150–400)
RBC: 3.57 MIL/uL — ABNORMAL LOW (ref 3.87–5.11)
RDW: 13.5 % (ref 11.5–15.5)
WBC: 5.4 10*3/uL (ref 4.0–10.5)
nRBC: 0 % (ref 0.0–0.2)

## 2018-12-05 LAB — LIPASE, BLOOD: Lipase: 26 U/L (ref 11–51)

## 2018-12-05 LAB — SARS CORONAVIRUS 2 BY RT PCR (HOSPITAL ORDER, PERFORMED IN ~~LOC~~ HOSPITAL LAB): SARS Coronavirus 2: NEGATIVE

## 2018-12-05 MED ORDER — METOCLOPRAMIDE HCL 5 MG/ML IJ SOLN
10.0000 mg | Freq: Once | INTRAMUSCULAR | Status: AC
  Administered 2018-12-05: 10 mg via INTRAVENOUS
  Filled 2018-12-05: qty 2

## 2018-12-05 MED ORDER — SODIUM CHLORIDE 0.9 % IV BOLUS (SEPSIS)
1000.0000 mL | Freq: Once | INTRAVENOUS | Status: AC
  Administered 2018-12-05: 1000 mL via INTRAVENOUS

## 2018-12-05 MED ORDER — METOCLOPRAMIDE HCL 10 MG PO TABS: 10.0000 mg | ORAL_TABLET | Freq: Three times a day (TID) | ORAL | 0 refills | Status: DC | PRN

## 2018-12-05 MED ORDER — ONDANSETRON HCL 4 MG/2ML IJ SOLN
4.0000 mg | Freq: Once | INTRAMUSCULAR | Status: AC
  Administered 2018-12-05: 4 mg via INTRAVENOUS
  Filled 2018-12-05: qty 2

## 2018-12-05 NOTE — ED Provider Notes (Signed)
[facility] EMERGENCY DEPARTMENT Provider Note   CSN: [id number] Arrival date & time: 12/05/18  0142     History   Chief Complaint Chief Complaint  Patient presents with  . Emesis    HPI Camas is a 78 y.o. female.     The history is provided by the patient.  Emesis Severity:  Moderate Timing:  Intermittent Progression:  Worsening Chronicity:  New Relieved by:  Nothing Worsened by:  Nothing Associated symptoms: sore throat   Associated symptoms: no abdominal pain, no diarrhea and no fever    Patient with history of anxiety, depression, GERD presents with vomiting.  She reports nausea and vomiting that began earlier in the day.  It is nonbloody emesis.  She was visiting her sister at the time.  She took a Zofran without relief.  She reports normal bowel movements.  No diarrhea.  No abdominal pain.  Denies chest pain.  She told nursing that she fell recently, but no head injury. No significant cough, but does report recent sore throat Past Medical History:  Diagnosis Date  . Anemia   . Anxiety   . Arthritis   . Breast cancer ([place]) 05/19/2016   Right breast  . Childhood asthma   . Chronic back pain    Scoliosis, stenosis  . Depression   . Diverticulosis   . GERD (gastroesophageal reflux disease)   . Hard of hearing   . Headache   . History of blood transfusion   . History of colon polyps   . History of hiatal hernia   . History of pneumonia   . Insomnia   . Osteopenia   . Osteoporosis   . Personal history of radiation therapy   . Thickened endometrium 09/29/2017    Patient Active Problem List   Diagnosis Date Noted  . MDD (major depressive disorder), recurrent episode, moderate ([place]) 06/13/2018  . Thickened endometrium 09/29/2017  . LLQ fullness 09/23/2017  . History of breast cancer 09/23/2017  . Screening for colorectal cancer 09/23/2017  . Routine cervical smear 09/23/2017  . Encounter for gynecological examination with Papanicolaou smear  of cervix 09/23/2017  . Dysphagia 09/01/2017  . 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 ([place]) 08/13/2016  . Malignant neoplasm of upper-outer quadrant of right breast in female, estrogen receptor positive ([place]) 06/22/2016  . Osteopenia determined by x-ray 06/22/2016  . Hyponatremia 10/12/2015  . Syncope 10/11/2015  . Acute pancreatitis 10/03/2015  . Other fatigue 12/16/2014  . PVC's (premature ventricular contractions) 12/16/2014  . Pain with hip hemiarthroplasty ([place]) 07/04/2014  . Left hip pain 01/31/2013  . Anemia 08/09/2012  . B12 deficiency anemia 08/09/2012  . Vitamin D deficiency 08/09/2012  . Anxiety 08/09/2012  . Palpitations 08/09/2012  . Hyperlipidemia 05/25/2011  . Hypertension 05/25/2011  . Dysphagia 05/25/2011    Past Surgical History:  Procedure Laterality Date  . BREAST LUMPECTOMY WITH RADIOACTIVE SEED AND SENTINEL LYMPH NODE BIOPSY Right 07/31/2016   Procedure: BREAST LUMPECTOMY WITH RADIOACTIVE SEED AND SENTINEL LYMPH NODE BIOPSY, INJECT BLUE DYE RIGHT BREAST;  Surgeon: Fanny Skates, MD;  Location: [place];  Service: General;  Laterality: Right;  . COLONOSCOPY    . 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 ESOPHAGEAL DILATION N/A 09/24/2017   Procedure: ESOPHAGEAL DILATION;  Surgeon: Rogene Houston, MD;  [facility];  Service:  Endoscopy;  Laterality: N/A;  . ESOPHAGOGASTRODUODENOSCOPY N/A 01/26/2014   Procedure: ESOPHAGOGASTRODUODENOSCOPY (EGD);  Surgeon: Rogene Houston, MD;  [facility];  Service: Endoscopy;  Laterality: N/A;  1030  . ESOPHAGOGASTRODUODENOSCOPY (EGD) WITH PROPOFOL N/A 09/24/2017   Procedure: ESOPHAGOGASTRODUODENOSCOPY (EGD) WITH PROPOFOL;  Surgeon: Rogene Houston, MD;  [facility];  Service: Endoscopy;  Laterality: N/A;  11:15  . HIP FRACTURE SURGERY      05/2009 left -Mayer Camel  . HYSTEROSCOPY W/D&C  05/24/2018   Procedure: DILATATION AND CURETTAGE /HYSTEROSCOPY (PROCEDURE # 2)/PAP SMEAR (PROCEDURE #1);  Surgeon: Jonnie Kind, MD;  Location: [facility];  Service: Gynecology;;  . Venia Minks DILATION N/A 01/26/2014   Procedure: Keturah Shavers;  Surgeon: Rogene Houston, MD;  [facility];  Service: Endoscopy;  Laterality: N/A;     OB History    Gravida  3   Para  3   Term  3   Preterm      AB      Living  3     SAB      TAB      Ectopic      Multiple      Live Births  3            Home Medications    Prior to Admission medications   Medication Sig Start Date End Date Taking? Authorizing Provider  ALPRAZolam (XANAX) 0.25 MG tablet Take 0.25 mg by mouth at bedtime.    [provider]  atorvastatin (LIPITOR) 10 MG tablet Take 10 mg by mouth daily.    [provider]  cyanocobalamin (,VITAMIN B-12,) 1000 MCG/ML injection Inject 1,000 mcg into the muscle every 30 (thirty) days.    [provider]  docusate sodium (COLACE) 100 MG capsule Take 2 capsules (200 mg total) by mouth daily. Patient taking differently: Take 200-300 mg by mouth daily.  08/31/17   Rehman, Mechele Dawley, MD  famotidine (PEPCID) 20 MG tablet TAKE 1 TABLET (20 MG TOTAL) BY MOUTH TWICE A DAY 06/16/18   Rehman, Mechele Dawley, MD  ferrous sulfate 325 (65 FE) MG tablet Take 325 mg by mouth daily with breakfast.    [provider]  gabapentin (NEURONTIN) 100 MG capsule Take 1 capsule (100 mg total) by mouth at bedtime. 09/07/18   Magrinat, Virgie Dad, MD  HYDROcodone-acetaminophen (NORCO) 10-325 MG tablet Take 1 tablet by mouth 3 (three) times daily as needed for moderate pain.     [provider]  mirtazapine (REMERON) 15 MG tablet Take 1 tablet (15 mg total) by mouth at bedtime. 12/06/18   Norman Clay, MD  pantoprazole (PROTONIX) 40 MG tablet TAKE ONE (1) TABLET EACH DAY 06/16/18   Rehman, Mechele Dawley, MD  Polyethyl  Glycol-Propyl Glycol (SYSTANE OP) Place 1 drop into both eyes daily as needed (for dry eyes).    [provider]  polyethylene glycol powder (GLYCOLAX/MIRALAX) powder Take 8.5 g by mouth daily. Patient taking differently: Take 17 g by mouth daily as needed for moderate constipation.  08/31/17   Rogene Houston, MD  tamoxifen (NOLVADEX) 20 MG tablet Take 1 tablet (20 mg total) by mouth daily. Patient taking differently: Take 20 mg by mouth at bedtime.  03/24/18   Magrinat, Virgie Dad, MD  venlafaxine XR (EFFEXOR-XR) 150 MG 24 hr capsule 225 mg daily (150 mg + 75 mg) 12/06/18   Norman Clay, MD  venlafaxine XR (EFFEXOR-XR) 75 MG 24 hr capsule 225 mg daily (150  mg + 75 mg) 12/06/18   Norman Clay, MD  zolpidem (AMBIEN) 5 MG tablet Take 5 mg by mouth at bedtime as needed.    [provider]    Family History Family History  Problem Relation Age of Onset  . Heart failure Mother   . Diabetes Mother   . Hypertension Mother   . Depression Mother   . Other Father        stomach ulcers; abd aneursym  . Hypertension Sister   . Heart failure Sister   . Diabetes Sister   . Depression Sister   . Diabetes Sister        prediabetes  . Cancer Maternal Aunt        breast    Social History Social History   Tobacco Use  . Smoking status: Former Smoker    Packs/day: 0.50    Years: 2.00    Pack years: 1.00    Types: Cigarettes    Quit date: 01/15/1961    Years since quitting: 57.9  . Smokeless tobacco: Never Used  Substance Use Topics  . Alcohol use: No    Alcohol/week: 0.0 standard drinks  . Drug use: No     Allergies   Avelox [moxifloxacin hcl in nacl]   Review of Systems Review of Systems  Constitutional: Negative for fever.  HENT: Positive for sore throat.   Cardiovascular: Negative for chest pain.  Gastrointestinal: Positive for vomiting. Negative for abdominal pain, constipation and diarrhea.  All other systems reviewed and are negative.    Physical Exam  Updated Vital Signs BP (!) 174/86 (BP Location: Left Arm)   Pulse 86   Temp (!) 97.5 F (36.4 C) (Oral)   Resp (!) 22   Ht 1.651 m (5\' 5" )   Wt 52.2 kg   SpO2 100%   BMI 19.14 kg/m   Physical Exam  CONSTITUTIONAL: Elderly, ill-appearing HEAD: Normocephalic/atraumatic EYES: EOMI/PERRL, no icterus  ENMT: Mucous membranes dry NECK: supple no meningeal signs SPINE/BACK:entire spine nontender CV: S1/S2 noted, no murmurs/rubs/gallops noted LUNGS: Lungs are clear to auscultation bilaterally, no apparent distress ABDOMEN: soft, nontender, no rebound or guarding, bowel sounds noted throughout abdomen GU:no cva tenderness NEURO: Pt is awake/alert/appropriate, moves all extremitiesx4.  No facial droop.   EXTREMITIES: pulses normal/equal, full ROM SKIN: warm, color normal PSYCH:  anxious ED Treatments / Results  Labs (all labs ordered are listed, but only abnormal results are displayed) Labs Reviewed  CBC WITH DIFFERENTIAL/PLATELET - Abnormal; Notable for the following components:      Result Value   RBC 3.57 (*)    Hemoglobin 11.4 (*)    HCT 35.3 (*)    Lymphs Abs 0.5 (*)    All other components within normal limits  COMPREHENSIVE METABOLIC PANEL - Abnormal; Notable for the following components:   Sodium 130 (*)    Chloride 97 (*)    CO2 19 (*)    Glucose, Bld 182 (*)    Total Protein 8.2 (*)    All other components within normal limits  SARS CORONAVIRUS 2 (HOSPITAL ORDER, [place] [facility])  LIPASE, BLOOD    EKG EKG Interpretation  Date/Time:  Monday December 05 2018 02:13:39 EDT Ventricular Rate:  86 PR Interval:    QRS Duration: 97 QT Interval:  420 QTC Calculation: 503 R Axis:   45 Text Interpretation:  Sinus rhythm Abnormal R-wave progression, early transition Prolonged QT interval No significant change since last tracing Confirmed by Ripley Fraise [phone]) on  12/05/2018 2:20:50 AM   Radiology No results found.  Procedures Procedures     Medications Ordered in ED Medications  sodium chloride 0.9 % bolus 1,000 mL (1,000 mLs Intravenous New Bag/Given 12/05/18 0241)  ondansetron (ZOFRAN) injection 4 mg (4 mg Intravenous Given 12/05/18 0241)  metoCLOPramide (REGLAN) injection 10 mg (10 mg Intravenous Given 12/05/18 0319)     Initial Impression / Assessment and Plan / ED Course  I have reviewed the triage vital signs and the nursing notes.  Pertinent labs  results that were available during my care of the patient were reviewed by me and considered in my medical decision making (see chart for details).        5:05 AM Patient presents with nausea vomiting over the past day.  She had no diarrhea, was reported normal bowel movements.  She had no chest or abdominal pain.  No focal tenderness on exam Labs reveal dehydration.  She was negative for COVID-19 She seemed to improve with Reglan, she is awake alert walking around.  She is taking p.o. fluids without difficulty. I feel she is appropriate discharge home.  Will place her on Reglan as she did have good response to that.  We discussed strict return precautions  [place] Baumann was evaluated in Emergency Department on 12/05/2018 for the symptoms described in the history of present illness. She was evaluated in the context of the global COVID-19 pandemic, which necessitated consideration that the patient might be at risk for infection with the SARS-CoV-2 virus that causes COVID-19. Institutional protocols and algorithms that pertain to the evaluation of patients at risk for COVID-19 are in a state of rapid change based on information released by regulatory bodies including the CDC and federal and state organizations. These policies and algorithms were followed during the patient's care in the ED.   Final Clinical Impressions(s) / ED Diagnoses   Final diagnoses:  Non-intractable vomiting with nausea, unspecified vomiting type  Dehydration    ED Discharge Orders    None        Ripley Fraise, MD 12/05/18 [phone]

## 2018-12-05 NOTE — Discharge Instructions (Addendum)

## 2018-12-05 NOTE — ED Triage Notes (Addendum)
Pt reports nausea and vomiting that started today. Pt took one of her sister's zofran earlier with no relief. Pt had normal BM today and reports passing gas and belching. PT denies pain to chest or abdomen. Pt reports falling yesterday outside landing on concrete, pt says she did hit her head but denies LOC. No bumps or bruising noted.

## 2018-12-07 NOTE — Progress Notes (Deleted)
BH MD/PA/NP OP Progress Note  12/07/2018 9:48 AM Marilyn Haas  MRN:  [id number]  Chief Complaint:  HPI: *** Visit Diagnosis: No diagnosis found.  Past Psychiatric History: Please see initial evaluation for full details. I have reviewed the history. No updates at this time.     Past Medical History:  Past Medical History:  Diagnosis Date  . Anemia   . Anxiety   . Arthritis   . Breast cancer ([place]) 05/19/2016   Right breast  . Childhood asthma   . Chronic back pain    Scoliosis, stenosis  . Depression   . Diverticulosis   . GERD (gastroesophageal reflux disease)   . Hard of hearing   . Headache   . History of blood transfusion   . History of colon polyps   . History of hiatal hernia   . History of pneumonia   . Insomnia   . Osteopenia   . Osteoporosis   . Personal history of radiation therapy   . Thickened endometrium 09/29/2017    Past Surgical History:  Procedure Laterality Date  . BREAST LUMPECTOMY WITH RADIOACTIVE SEED AND SENTINEL LYMPH NODE BIOPSY Right 07/31/2016   Procedure: BREAST LUMPECTOMY WITH RADIOACTIVE SEED AND SENTINEL LYMPH NODE BIOPSY, INJECT BLUE DYE RIGHT BREAST;  Surgeon: Fanny Skates, MD;  Location: [place];  Service: General;  Laterality: Right;  . COLONOSCOPY    . 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 ESOPHAGEAL DILATION N/A 09/24/2017   Procedure: ESOPHAGEAL DILATION;  Surgeon: Rogene Houston, MD;  [facility];  Service: Endoscopy;  Laterality: N/A;  . ESOPHAGOGASTRODUODENOSCOPY N/A 01/26/2014   Procedure: ESOPHAGOGASTRODUODENOSCOPY (EGD);  Surgeon: Rogene Houston, MD;  [facility];  Service: Endoscopy;  Laterality: N/A;  1030  . ESOPHAGOGASTRODUODENOSCOPY (EGD) WITH PROPOFOL N/A 09/24/2017   Procedure: ESOPHAGOGASTRODUODENOSCOPY (EGD) WITH PROPOFOL;  Surgeon: Rogene Houston, MD;  [facility];  Service: Endoscopy;  Laterality: N/A;  11:15  . HIP FRACTURE SURGERY     05/2009 left -Mayer Camel  . HYSTEROSCOPY W/D&C  05/24/2018   Procedure: DILATATION AND CURETTAGE /HYSTEROSCOPY (PROCEDURE # 2)/PAP SMEAR (PROCEDURE #1);  Surgeon: Jonnie Kind, MD;  [facility];  Service: Gynecology;;  . Venia Minks DILATION N/A 01/26/2014   Procedure: Keturah Shavers;  Surgeon: Rogene Houston, MD;  [facility];  Service: Endoscopy;  Laterality: N/A;    Family Psychiatric History: Please see initial evaluation for full details. I have reviewed the history. No updates at this time.     Family History:  Family History  Problem Relation Age of Onset  . Heart failure Mother   . Diabetes Mother   . Hypertension Mother   . Depression Mother   . Other Father        stomach ulcers; abd aneursym  . Hypertension Sister   . Heart failure Sister   . Diabetes Sister   . Depression Sister   . Diabetes Sister        prediabetes  . Cancer Maternal Aunt        breast    Social History:  Social History   Socioeconomic History  . Marital status: Married    Spouse name: Not on file  . Number of children: 3  . Years of education: Not on file  . Highest education level: Not on  file  Occupational History    Employer: RETIRED  Social Needs  . Financial resource strain: Not on file  . Food insecurity    Worry: Not on file    Inability: Not on file  . Transportation needs    Medical: Not on file    Non-medical: Not on file  Tobacco Use  . Smoking status: Former Smoker    Packs/day: 0.50    Years: 2.00    Pack years: 1.00    Types: Cigarettes    Quit date: 01/15/1961    Years since quitting: 57.9  . Smokeless tobacco: Never Used  Substance and Sexual Activity  . Alcohol use: No    Alcohol/week: 0.0 standard drinks  . Drug use: No  . Sexual activity: Not Currently    Birth control/protection: Post-menopausal  Lifestyle  . Physical  activity    Days per week: Not on file    Minutes per session: Not on file  . Stress: Not on file  Relationships  . Social Herbalist on phone: Not on file    Gets together: Not on file    Attends religious service: Not on file    Active member of club or organization: Not on file    Attends meetings of clubs or organizations: Not on file    Relationship status: Not on file  Other Topics Concern  . Not on file  Social History Narrative  . Not on file    Allergies:  Allergies  Allergen Reactions  . Avelox [Moxifloxacin Hcl In Nacl] Other (See Comments)    Altered mental status    Metabolic Disorder Labs: Lab Results  Component Value Date   HGBA1C 5.4 02/17/2013   MPG 108 02/17/2013   No results found for: PROLACTIN Lab Results  Component Value Date   CHOL 209 (H) 12/13/2014   TRIG 95 12/13/2014   HDL 114 12/13/2014   CHOLHDL 1.8 12/13/2014   VLDL 19 12/13/2014   LDLCALC 76 12/13/2014   LDLCALC 88 02/17/2013   Lab Results  Component Value Date   TSH 1.97 04/11/2018    Therapeutic Level Labs: No results found for: LITHIUM No results found for: VALPROATE No components found for:  CBMZ  Current Medications: Current Outpatient Medications  Medication Sig Dispense Refill  . ALPRAZolam (XANAX) 0.25 MG tablet Take 0.25 mg by mouth at bedtime.    Marland Kitchen atorvastatin (LIPITOR) 10 MG tablet Take 10 mg by mouth daily.    . cyanocobalamin (,VITAMIN B-12,) 1000 MCG/ML injection Inject 1,000 mcg into the muscle every 30 (thirty) days.    Marland Kitchen docusate sodium (COLACE) 100 MG capsule Take 2 capsules (200 mg total) by mouth daily. (Patient taking differently: Take 200-300 mg by mouth daily. ) 10 capsule 0  . famotidine (PEPCID) 20 MG tablet TAKE 1 TABLET (20 MG TOTAL) BY MOUTH TWICE A DAY 60 tablet 11  . ferrous sulfate 325 (65 FE) MG tablet Take 325 mg by mouth daily with breakfast.    . gabapentin (NEURONTIN) 100 MG capsule Take 1 capsule (100 mg total) by mouth at  bedtime. 30 capsule 3  . HYDROcodone-acetaminophen (NORCO) 10-325 MG tablet Take 1 tablet by mouth 3 (three) times daily as needed for moderate pain.     Marland Kitchen metoCLOPramide (REGLAN) 10 MG tablet Take 1 tablet (10 mg total) by mouth every 8 (eight) hours as needed for nausea (nausea/headache). 10 tablet 0  . mirtazapine (REMERON) 15 MG tablet Take 1 tablet (15 mg total)  by mouth at bedtime. 90 tablet 0  . pantoprazole (PROTONIX) 40 MG tablet TAKE ONE (1) TABLET EACH DAY 30 tablet 3  . Polyethyl Glycol-Propyl Glycol (SYSTANE OP) Place 1 drop into both eyes daily as needed (for dry eyes).    . polyethylene glycol powder (GLYCOLAX/MIRALAX) powder Take 8.5 g by mouth daily. (Patient taking differently: Take 17 g by mouth daily as needed for moderate constipation. ) 255 g 0  . tamoxifen (NOLVADEX) 20 MG tablet Take 1 tablet (20 mg total) by mouth daily. (Patient taking differently: Take 20 mg by mouth at bedtime. ) 90 tablet 2  . venlafaxine XR (EFFEXOR-XR) 150 MG 24 hr capsule 225 mg daily (150 mg + 75 mg) 90 capsule 0  . venlafaxine XR (EFFEXOR-XR) 75 MG 24 hr capsule 225 mg daily (150 mg + 75 mg) 90 capsule 0  . zolpidem (AMBIEN) 5 MG tablet Take 5 mg by mouth at bedtime as needed.     No current facility-administered medications for this visit.      Musculoskeletal: Strength & Muscle Tone: N/A Gait & Station: N/A Patient leans: N/A  Psychiatric Specialty Exam: ROS  There were no vitals taken for this visit.There is no height or weight on file to calculate BMI.  General Appearance: {Appearance:22683}  Eye Contact:  {BHH EYE CONTACT:22684}  Speech:  Clear and Coherent  Volume:  Normal  Mood:  {BHH MOOD:22306}  Affect:  {Affect (PAA):22687}  Thought Process:  Coherent  Orientation:  Full (Time, Place, and Person)  Thought Content: Logical   Suicidal Thoughts:  {ST/HT (PAA):22692}  Homicidal Thoughts:  {ST/HT (PAA):22692}  Memory:  Immediate;   Good  Judgement:  {Judgement (PAA):22694}   Insight:  {Insight (PAA):22695}  Psychomotor Activity:  Normal  Concentration:  Concentration: Good and Attention Span: Good  Recall:  Good  Fund of Knowledge: Good  Language: Good  Akathisia:  No  Handed:  Right  AIMS (if indicated): not done  Assets:  Communication Skills Desire for Improvement  ADL's:  Intact  Cognition: WNL  Sleep:  {BHH GOOD/FAIR/POOR:22877}   Screenings: PHQ2-9     Office Visit from 09/23/2017 in [place] Follow Up  from 12/08/2016 in [place] from 08/13/2016 in [place]  PHQ-2 Total Score  6  1  1   PHQ-9 Total Score  24  -  -       Assessment and Plan:  Marilyn Haas is a 78 y.o. year old female with a history of depression,esophageal dysphasia, weight loss, history of breast canceron tamoxifens/p lumpectomy, radiation, currently on anastrozole , who presents for follow up appointment for No diagnosis found.  # MDD, moderate, recurrent without psychotic features   She continues to reports depressive symptoms with anxiety since the last visit.  It is notable that she has been nonadherent to medication despite psycho education over the past few months. Her son is also concerned of her hoarding behavior (which is chronic even before the death of her husband).  Psychosocial stressors includes grief of loss of her husband from pancreatic cancer, and her sister with medical condition.  She is strongly advised to take mirtazapine consistently as adjunctive treatment for depression.  Will continue venlafaxine to target depression.  She will greatly benefit from CBT; will make a referral again.   # r/o memory loss She has been non adherent to medication, which could be attributable to memory loss.  It is difficult  to discern etiology of memory loss with active mood symptoms. Will continue to assess. Of note, although she does have dysarthria on today's evaluation,  her son states that he is aware of her dysarthria due to dentures. No known weakness to concern for stroke.   Plan 1.ContinueEffexor 225 mg daily 2. Continue mirtazapine15 mgat night (she is advised to take it consistently) 3.Next appointment:  9/25 at 11:30 for 30 mins, video [phone], her son, Marilyn Haas's phone) [place] reviewed; wnl - Referral to therapy (she is not on xanax anymore per patient report) - xanax 0.25 mg BID,Ambien5 mg   Past trials of medication:lexapro, duloxetine, quetiapine (drowsiness)  The patient demonstrates the following risk factors for suicide: Chronic risk factors for suicide include:psychiatric disorder ofdepression. Acute risk factorsfor suicide include: unemployment, Theme park manager and loss (financial, interpersonal, professional). Protective factorsfor this patient include: positive social support, coping skills and hope for the future. Considering these factors, the overall suicide risk at this point appears to below. Patientisappropriate for outpatient follow up.  Norman Clay, MD 12/07/2018, 9:48 AM

## 2018-12-09 ENCOUNTER — Telehealth (HOSPITAL_COMMUNITY): Payer: Self-pay | Admitting: Psychiatry

## 2018-12-09 ENCOUNTER — Other Ambulatory Visit: Payer: Self-pay

## 2018-12-09 ENCOUNTER — Ambulatory Visit (HOSPITAL_COMMUNITY): Payer: Medicare Other | Admitting: Psychiatry

## 2018-12-09 NOTE — Telephone Encounter (Signed)
Called the patient  twice for appointment scheduled today. The patient did not answer the phone. Left voice message to contact the office. (Also called her son's phone- no answer, voicemail is full, and unable to leave a voice message.)

## 2018-12-14 DIAGNOSIS — Z23 Encounter for immunization: Secondary | ICD-10-CM | POA: Diagnosis not present

## 2018-12-14 DIAGNOSIS — F5101 Primary insomnia: Secondary | ICD-10-CM | POA: Diagnosis not present

## 2018-12-14 DIAGNOSIS — G894 Chronic pain syndrome: Secondary | ICD-10-CM | POA: Diagnosis not present

## 2018-12-14 DIAGNOSIS — Z6822 Body mass index (BMI) 22.0-22.9, adult: Secondary | ICD-10-CM | POA: Diagnosis not present

## 2018-12-14 DIAGNOSIS — F419 Anxiety disorder, unspecified: Secondary | ICD-10-CM | POA: Diagnosis not present

## 2019-01-11 DIAGNOSIS — G894 Chronic pain syndrome: Secondary | ICD-10-CM | POA: Diagnosis not present

## 2019-01-17 NOTE — Progress Notes (Signed)
Virtual Visit via Video Note  I connected with Marilyn Haas on 01/24/19 at  4:30 PM EST by a video enabled telemedicine application and verified that I am speaking with the correct person using two identifiers.   I discussed the limitations of evaluation and management by telemedicine and the availability of in person appointments. The patient expressed understanding and agreed to proceed.     I discussed the assessment and treatment plan with the patient. The patient was provided an opportunity to ask questions and all were answered. The patient agreed with the plan and demonstrated an understanding of the instructions.   The patient was advised to call back or seek an in-person evaluation if the symptoms worsen or if the condition fails to improve as anticipated.  I provided 25 minutes of non-face-to-face time during this encounter.   Marilyn Clay, MD    [facility] MD/PA/NP OP Progress Note  01/24/2019 5:04 PM Marilyn Haas  MRN:  [id number]  Chief Complaint:  Chief Complaint    Depression; Follow-up     HPI:  This is a follow-up appointment for depression.  She states that she has not been doing well.  She feels depressed, and misses her husband.  She sobs, stating that her grandchild had a baby. She does not know what to do. Her son visited the patient, bringing a cake and flower on her birthday, which was "nice." However, she states that she did not have a good time on birthday, stating that she is 78 year old. She feels frustrated that she cannot get anything done while her grandchild visits the patient. She has no routine except that she is trying to clean up the house.  She has insomnia.  She has anhedonia.  She has low energy.  She has difficulty concentration.  She denies SI.  She feels anxious and tense.  She denies panic attacks.   Marilyn Haas, her son presents to the interview with patient consent.  He states that she tends to control things which she cannot control. He  states that her grandchild is unemployed and that is part of the reason she is concerned. She has been hoarding for more than 20 years, and has had difficulty in throwing away clutters. He asks if she is accurately diagnosed (he understands that we will first work on her mood symptoms while monitoring her hoarding.)    Visit Diagnosis:    ICD-10-CM   1. MDD (major depressive disorder), recurrent episode, moderate ([place])  F33.1     Past Psychiatric History: Please see initial evaluation for full details. I have reviewed the history. No updates at this time.     Past Medical History:  Past Medical History:  Diagnosis Date  . Anemia   . Anxiety   . Arthritis   . Breast cancer ([place]) 05/19/2016   Right breast  . Childhood asthma   . Chronic back pain    Scoliosis, stenosis  . Depression   . Diverticulosis   . GERD (gastroesophageal reflux disease)   . Hard of hearing   . Headache   . History of blood transfusion   . History of colon polyps   . History of hiatal hernia   . History of pneumonia   . Insomnia   . Osteopenia   . Osteoporosis   . Personal history of radiation therapy   . Thickened endometrium 09/29/2017    Past Surgical History:  Procedure Laterality Date  . BREAST LUMPECTOMY WITH RADIOACTIVE SEED AND SENTINEL LYMPH  NODE BIOPSY Right 07/31/2016   Procedure: BREAST LUMPECTOMY WITH RADIOACTIVE SEED AND SENTINEL LYMPH NODE BIOPSY, INJECT BLUE DYE RIGHT BREAST;  Surgeon: Fanny Skates, MD;  Location: [facility];  Service: General;  Laterality: Right;  . COLONOSCOPY    . 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 ESOPHAGEAL DILATION N/A 09/24/2017   Procedure: ESOPHAGEAL DILATION;  Surgeon: Rogene Houston, MD;  [facility];  Service: Endoscopy;  Laterality: N/A;  . ESOPHAGOGASTRODUODENOSCOPY N/A 01/26/2014   Procedure:  ESOPHAGOGASTRODUODENOSCOPY (EGD);  Surgeon: Rogene Houston, MD;  [facility];  Service: Endoscopy;  Laterality: N/A;  1030  . ESOPHAGOGASTRODUODENOSCOPY (EGD) WITH PROPOFOL N/A 09/24/2017   Procedure: ESOPHAGOGASTRODUODENOSCOPY (EGD) WITH PROPOFOL;  Surgeon: Rogene Houston, MD;  [facility];  Service: Endoscopy;  Laterality: N/A;  11:15  . HIP FRACTURE SURGERY     05/2009 left -Mayer Camel  . HYSTEROSCOPY W/D&C  05/24/2018   Procedure: DILATATION AND CURETTAGE /HYSTEROSCOPY (PROCEDURE # 2)/PAP SMEAR (PROCEDURE #1);  Surgeon: Jonnie Kind, MD;  [facility];  Service: Gynecology;;  . Venia Minks DILATION N/A 01/26/2014   Procedure: Keturah Shavers;  Surgeon: Rogene Houston, MD;  [facility];  Service: Endoscopy;  Laterality: N/A;    Family Psychiatric History: Please see initial evaluation for full details. I have reviewed the history. No updates at this time.     Family History:  Family History  Problem Relation Age of Onset  . Heart failure Mother   . Diabetes Mother   . Hypertension Mother   . Depression Mother   . Other Father        stomach ulcers; abd aneursym  . Hypertension Sister   . Heart failure Sister   . Diabetes Sister   . Depression Sister   . Diabetes Sister        prediabetes  . Cancer Maternal Aunt        breast    Social History:  Social History   Socioeconomic History  . Marital status: Married    Spouse name: Not on file  . Number of children: 3  . Years of education: Not on file  . Highest education level: Not on file  Occupational History    Employer: RETIRED  Social Needs  . Financial resource strain: Not on file  . Food insecurity    Worry: Not on file    Inability: Not on file  . Transportation needs    Medical: Not on file    Non-medical: Not on file  Tobacco Use  . Smoking status: Former Smoker    Packs/day: 0.50    Years: 2.00    Pack years: 1.00    Types: Cigarettes    Quit date: 01/15/1961     Years since quitting: 58.0  . Smokeless tobacco: Never Used  Substance and Sexual Activity  . Alcohol use: No    Alcohol/week: 0.0 standard drinks  . Drug use: No  . Sexual activity: Not Currently    Birth control/protection: Post-menopausal  Lifestyle  . Physical activity    Days per week: Not on file    Minutes per session: Not on file  . Stress: Not on file  Relationships  . Social Herbalist on phone: Not on file    Gets together: Not on file  Attends religious service: Not on file    Active member of club or organization: Not on file    Attends meetings of clubs or organizations: Not on file    Relationship status: Not on file  Other Topics Concern  . Not on file  Social History Narrative  . Not on file    Allergies:  Allergies  Allergen Reactions  . Avelox [Moxifloxacin Hcl In Nacl] Other (See Comments)    Altered mental status    Metabolic Disorder Labs: Lab Results  Component Value Date   HGBA1C 5.4 02/17/2013   MPG 108 02/17/2013   No results found for: PROLACTIN Lab Results  Component Value Date   CHOL 209 (H) 12/13/2014   TRIG 95 12/13/2014   HDL 114 12/13/2014   CHOLHDL 1.8 12/13/2014   VLDL 19 12/13/2014   LDLCALC 76 12/13/2014   LDLCALC 88 02/17/2013   Lab Results  Component Value Date   TSH 1.97 04/11/2018    Therapeutic Level Labs: No results found for: LITHIUM No results found for: VALPROATE No components found for:  CBMZ  Current Medications: Current Outpatient Medications  Medication Sig Dispense Refill  . ALPRAZolam (XANAX) 0.25 MG tablet Take 0.25 mg by mouth at bedtime.    Marland Kitchen atorvastatin (LIPITOR) 10 MG tablet Take 10 mg by mouth daily.    . cyanocobalamin (,VITAMIN B-12,) 1000 MCG/ML injection Inject 1,000 mcg into the muscle every 30 (thirty) days.    Marland Kitchen docusate sodium (COLACE) 100 MG capsule Take 2 capsules (200 mg total) by mouth daily. (Patient taking differently: Take 200-300 mg by mouth daily. ) 10 capsule 0   . famotidine (PEPCID) 20 MG tablet TAKE 1 TABLET (20 MG TOTAL) BY MOUTH TWICE A DAY 60 tablet 11  . ferrous sulfate 325 (65 FE) MG tablet Take 325 mg by mouth daily with breakfast.    . gabapentin (NEURONTIN) 100 MG capsule Take 1 capsule (100 mg total) by mouth at bedtime. 30 capsule 3  . HYDROcodone-acetaminophen (NORCO) 10-325 MG tablet Take 1 tablet by mouth 3 (three) times daily as needed for moderate pain.     Marland Kitchen metoCLOPramide (REGLAN) 10 MG tablet Take 1 tablet (10 mg total) by mouth every 8 (eight) hours as needed for nausea (nausea/headache). 10 tablet 0  . mirtazapine (REMERON) 15 MG tablet Take 1 tablet (15 mg total) by mouth at bedtime. 90 tablet 0  . pantoprazole (PROTONIX) 40 MG tablet TAKE ONE (1) TABLET EACH DAY 30 tablet 3  . Polyethyl Glycol-Propyl Glycol (SYSTANE OP) Place 1 drop into both eyes daily as needed (for dry eyes).    . polyethylene glycol powder (GLYCOLAX/MIRALAX) powder Take 8.5 g by mouth daily. (Patient taking differently: Take 17 g by mouth daily as needed for moderate constipation. ) 255 g 0  . tamoxifen (NOLVADEX) 20 MG tablet Take 1 tablet (20 mg total) by mouth daily. (Patient taking differently: Take 20 mg by mouth at bedtime. ) 90 tablet 2  . [START ON 03/05/2019] venlafaxine XR (EFFEXOR-XR) 150 MG 24 hr capsule 225 mg daily (150 mg + 75 mg) 90 capsule 0  . venlafaxine XR (EFFEXOR-XR) 75 MG 24 hr capsule 225 mg daily (150 mg + 75 mg) 90 capsule 0  . zolpidem (AMBIEN) 5 MG tablet Take 5 mg by mouth at bedtime as needed.     No current facility-administered medications for this visit.      Musculoskeletal: Strength & Muscle Tone: N/A Gait & Station: N/A Patient leans: N/A  Psychiatric Specialty Exam: Review of Systems  Psychiatric/Behavioral: Positive for depression and memory loss. Negative for hallucinations, substance abuse and suicidal ideas. The patient is nervous/anxious and has insomnia.   All other systems reviewed and are negative.   There  were no vitals taken for this visit.There is no height or weight on file to calculate BMI.  General Appearance: Fairly Groomed  Eye Contact:  Good  Speech:  Clear and Coherent  Volume:  Normal  Mood:  Depressed  Affect:  Appropriate, Congruent and Tearful  Thought Process:  Coherent  Orientation:  Full (Time, Place, and Person)  Thought Content: Logical   Suicidal Thoughts:  No  Homicidal Thoughts:  No  Memory:  Immediate;   Good  Judgement:  Fair  Insight:  Present  Psychomotor Activity:  Normal  Concentration:  Concentration: Good and Attention Span: Good  Recall:  Good  Fund of Knowledge: Good  Language: Good  Akathisia:  No  Handed:  Right  AIMS (if indicated): not done  Assets:  Communication Skills Desire for Improvement  ADL's:  Intact  Cognition: WNL  Sleep:  Poor   Screenings: PHQ2-9     Office Visit from 09/23/2017 in [facility] Follow Up  from 12/08/2016 in [place] from 08/13/2016 in [place]  PHQ-2 Total Score  6  1  1   PHQ-9 Total Score  24  -  -       Assessment and Plan:  Marilyn Haas is a 78 y.o. year old female with a history of depression, esophageal dysphasia, weight loss, history of breast canceron tamoxifens/p lumpectomy, radiation, currently on anastrozole, who presents for follow up appointment for MDD (major depressive disorder), recurrent episode, moderate ([place])  # MDD, moderate, recurrent without psychotic features Exam is notable for labile affect , and she continues to report depressive symptoms and anxiety since her last visit.  Psychosocial stressors includes grief of loss of her husband from pancreatic cancer, and her sister with medical condition. She has been nonadherent to medication despite repeated psycho education over the past few months.  Discussed with Marilyn Haas, her son, who agrees to help her so that she can take medication consistently.   We will continue current medication regimen; will continue venlafaxine to target depression.  We will continue mirtazapine as adjunctive treatment for depression. Of note, although the patient reports preference to be back on duloxetine, will hold this option at this time given potential interaction with tamoxifen, and her history of limited benefit from this medication. She will greatly benefit from CBT/supportive therapy; will make referral again.   # r/o memory loss Medication on adherence may be attributable to memory loss given her age, although she is alert and oriented on exam. Will continue to monitor and assess with Mini cog/MOCA as needed. Of note, although she does have dysarthria on exam, her son attributes it to dentures. No known weakness to concern for stroke.   # r/o hoarding According to Marilyn Haas, her son, the patient has been hoarding things for over 20 years. Will continue to assess.    Plan I have reviewed and updated plans as below 1.ContinueEffexor 225 mg daily 2. Continue mirtazapine15 mgat night  3.Next appointment:  in one month. video ([facility]illy[facility]/son phone: [phone]) 4TSH reviewed; wnl - Referral to therapy (she is not on xanax anymore per patient report) - xanax 0.25 mg BID,Ambien5 mg   Past trials of medication:lexapro, duloxetine, quetiapine (  drowsiness)  The patient demonstrates the following risk factors for suicide: Chronic risk factors for suicide include:psychiatric disorder ofdepression. Acute risk factorsfor suicide include: unemployment, Theme park manager and loss (financial, interpersonal, professional). Protective factorsfor this patient include: positive social support, coping skills and hope for the future. Considering these factors, the overall suicide risk at this point appears to below. Patientisappropriate for outpatient follow up.  Marilyn Clay, MD 01/24/2019, 5:04 PM

## 2019-01-24 ENCOUNTER — Ambulatory Visit (INDEPENDENT_AMBULATORY_CARE_PROVIDER_SITE_OTHER): Payer: Medicare Other | Admitting: Psychiatry

## 2019-01-24 ENCOUNTER — Other Ambulatory Visit: Payer: Self-pay

## 2019-01-24 ENCOUNTER — Encounter (HOSPITAL_COMMUNITY): Payer: Self-pay | Admitting: Psychiatry

## 2019-01-24 DIAGNOSIS — F331 Major depressive disorder, recurrent, moderate: Secondary | ICD-10-CM

## 2019-01-24 MED ORDER — VENLAFAXINE HCL ER 75 MG PO CP24: ORAL_CAPSULE | ORAL | 0 refills | Status: DC

## 2019-01-24 MED ORDER — VENLAFAXINE HCL ER 150 MG PO CP24: ORAL_CAPSULE | ORAL | 0 refills | Status: DC

## 2019-01-24 NOTE — Patient Instructions (Signed)
1.ContinueEffexor 225 mg daily 2.Continue mirtazapine15 mgat night 3.Next appointment:in one month 4. Referral to therapy

## 2019-02-08 DIAGNOSIS — G894 Chronic pain syndrome: Secondary | ICD-10-CM | POA: Diagnosis not present

## 2019-02-08 DIAGNOSIS — F329 Major depressive disorder, single episode, unspecified: Secondary | ICD-10-CM | POA: Diagnosis not present

## 2019-02-28 ENCOUNTER — Ambulatory Visit (HOSPITAL_COMMUNITY): Payer: Medicare Other | Admitting: Psychiatry

## 2019-03-02 ENCOUNTER — Ambulatory Visit: Payer: Medicare Other | Admitting: Psychiatry

## 2019-03-02 ENCOUNTER — Other Ambulatory Visit: Payer: Self-pay

## 2019-03-07 ENCOUNTER — Other Ambulatory Visit: Payer: Self-pay | Admitting: *Deleted

## 2019-03-07 MED ORDER — TAMOXIFEN CITRATE 20 MG PO TABS: 20.0000 mg | ORAL_TABLET | Freq: Every day | ORAL | 2 refills | Status: DC

## 2019-03-08 ENCOUNTER — Telehealth: Payer: Self-pay | Admitting: Oncology

## 2019-03-08 NOTE — Telephone Encounter (Signed)
Scheduled appt per 12/22 sch message - unable to reach pt . Left message and mailed reminder letter with appt date and time

## 2019-04-05 ENCOUNTER — Telehealth: Payer: Self-pay | Admitting: Cardiology

## 2019-04-05 NOTE — Progress Notes (Signed)
Virtual Visit via Telephone Note   This visit type was conducted due to national recommendations for restrictions regarding the COVID-19 Pandemic (e.g. social distancing) in an effort to limit this patient's exposure and mitigate transmission in our community.  Due to her co-morbid illnesses, this patient is at least at moderate risk for complications without adequate follow up.  This format is felt to be most appropriate for this patient at this time.  The patient did not have access to video technology/had technical difficulties with video requiring transitioning to audio format only (telephone).  All issues noted in this document were discussed and addressed.  No physical exam could be performed with this format.  Please refer to the patient's chart for her  consent to telehealth for [facility].   Date:  04/06/2019   ID:  Shandel, Briegel Dec 17, 1940, MRN [id number]  Patient Location: Home Provider Location: Office  PCP:  Redmond School, MD  Cardiologist:  Rozann Lesches, MD Electrophysiologist:  None   Evaluation Performed:  Follow-Up Visit  Chief Complaint:  Cardiac follow-up  History of Present Illness:    [name] is an 79 y.o. female last seen in December 2019.  We spoke by phone today.  She does not report any significant palpitations, has had no dizziness or syncope.  Still living in her own home, her son from [place] does visit regularly.  She states that she has had follow-up with her PCP.  She has been very careful about social distancing and wearing a mask.  I reviewed her medications which are outlined below.  The patient does not have symptoms concerning for COVID-19 infection (fever, chills, cough, or new shortness of breath).  She does plan to get the vaccine.   Past Medical History:  Diagnosis Date  . Anemia   . Anxiety   . Arthritis   . Breast cancer ([place]) 05/19/2016   Right breast  . Childhood asthma   . Chronic back pain    Scoliosis, stenosis  . Depression   . Diverticulosis   . GERD (gastroesophageal reflux disease)   . Hard of hearing   . Headache   . History of blood transfusion   . History of colon polyps   . History of hiatal hernia   . History of pneumonia   . Insomnia   . Osteopenia   . Osteoporosis   . Personal history of radiation therapy   . Thickened endometrium 09/29/2017   Past Surgical History:  Procedure Laterality Date  . BREAST LUMPECTOMY WITH RADIOACTIVE SEED AND SENTINEL LYMPH NODE BIOPSY Right 07/31/2016   Procedure: BREAST LUMPECTOMY WITH RADIOACTIVE SEED AND SENTINEL LYMPH NODE BIOPSY, INJECT BLUE DYE RIGHT BREAST;  Surgeon: Fanny Skates, MD;  Location: [place];  Service: General;  Laterality: Right;  . COLONOSCOPY    . 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 ESOPHAGEAL DILATION N/A 09/24/2017   Procedure: ESOPHAGEAL DILATION;  Surgeon: Rogene Houston, MD;  [facility];  Service: Endoscopy;  Laterality: N/A;  . ESOPHAGOGASTRODUODENOSCOPY N/A 01/26/2014   Procedure: ESOPHAGOGASTRODUODENOSCOPY (EGD);  Surgeon: Rogene Houston, MD;  [facility];  Service: Endoscopy;  Laterality: N/A;  1030  . ESOPHAGOGASTRODUODENOSCOPY (EGD) WITH PROPOFOL N/A 09/24/2017   Procedure: ESOPHAGOGASTRODUODENOSCOPY (EGD) WITH PROPOFOL;  Surgeon: Rogene Houston, MD;  [facility];  Service: Endoscopy;  Laterality: N/A;  11:15  . HIP FRACTURE SURGERY     05/2009 left -Mayer Camel  . HYSTEROSCOPY WITH D & C  05/24/2018   Procedure: DILATATION AND CURETTAGE /HYSTEROSCOPY (PROCEDURE # 2)/PAP SMEAR (PROCEDURE #1);  Surgeon: Jonnie Kind, MD;  [facility];  Service: Gynecology;;  . Venia Minks DILATION N/A 01/26/2014   Procedure: Keturah Shavers;  Surgeon: Rogene Houston, MD;  [facility];  Service: Endoscopy;   Laterality: N/A;     Current Meds  Medication Sig  . ALPRAZolam (XANAX) 0.25 MG tablet Take 0.25 mg by mouth at bedtime.  Marland Kitchen atorvastatin (LIPITOR) 10 MG tablet Take 10 mg by mouth daily.  . cyanocobalamin (,VITAMIN B-12,) 1000 MCG/ML injection Inject 1,000 mcg into the muscle every 30 (thirty) days.  Marland Kitchen docusate sodium (COLACE) 100 MG capsule Take 2 capsules (200 mg total) by mouth daily. (Patient taking differently: Take 200-300 mg by mouth daily. )  . famotidine (PEPCID) 20 MG tablet TAKE 1 TABLET (20 MG TOTAL) BY MOUTH TWICE A DAY  . ferrous sulfate 325 (65 FE) MG tablet Take 325 mg by mouth daily with breakfast.  . gabapentin (NEURONTIN) 100 MG capsule Take 1 capsule (100 mg total) by mouth at bedtime.  Marland Kitchen HYDROcodone-acetaminophen (NORCO) 10-325 MG tablet Take 1 tablet by mouth 3 (three) times daily as needed for moderate pain.   Marland Kitchen metoCLOPramide (REGLAN) 10 MG tablet Take 1 tablet (10 mg total) by mouth every 8 (eight) hours as needed for nausea (nausea/headache).  . mirtazapine (REMERON) 15 MG tablet Take 1 tablet (15 mg total) by mouth at bedtime.  . pantoprazole (PROTONIX) 40 MG tablet TAKE ONE (1) TABLET EACH DAY  . Polyethyl Glycol-Propyl Glycol (SYSTANE OP) Place 1 drop into both eyes daily as needed (for dry eyes).  . polyethylene glycol powder (GLYCOLAX/MIRALAX) powder Take 8.5 g by mouth daily. (Patient taking differently: Take 17 g by mouth daily as needed for moderate constipation. )  . tamoxifen (NOLVADEX) 20 MG tablet Take 1 tablet (20 mg total) by mouth daily.  Marland Kitchen venlafaxine XR (EFFEXOR-XR) 150 MG 24 hr capsule 225 mg daily (150 mg + 75 mg)  . venlafaxine XR (EFFEXOR-XR) 75 MG 24 hr capsule 225 mg daily (150 mg + 75 mg)  . zolpidem (AMBIEN) 5 MG tablet Take 5 mg by mouth at bedtime as needed.     Allergies:   Avelox [moxifloxacin hcl in nacl]   Social History   Tobacco Use  . Smoking status: Former Smoker    Packs/day: 0.50    Years: 2.00    Pack years: 1.00     Types: Cigarettes    Quit date: 01/15/1961    Years since quitting: 58.2  . Smokeless tobacco: Never Used  Substance Use Topics  . Alcohol use: No    Alcohol/week: 0.0 standard drinks  . Drug use: No     Family Hx: The patient's family history includes Cancer in her maternal aunt; Depression in her mother and sister; Diabetes in her mother, sister, and sister; Heart failure in her mother and sister; Hypertension in her mother and sister; Other in her father.  ROS:   Please see the history of present illness. All other systems reviewed and are negative.   Prior CV studies:   The following studies were reviewed today:  Echocardiogram 02/18/2016: Study Conclusions  - Left ventricle: The cavity size was normal. Wall thickness was increased in a pattern of mild LVH. Systolic function was normal. The estimated ejection  fraction was in the range of 55% to 60%. Wall motion was normal; there were no regional wall motion abnormalities. Doppler parameters are consistent with abnormal left ventricular relaxation (grade 1 diastolic dysfunction). - Aortic valve: Mildly calcified annulus. Trileaflet. There was mild regurgitation. - Mitral valve: There was mild regurgitation. - Right atrium: Central venous pressure (est): 3 mm Hg. - Atrial septum: No defect or patent foramen ovale was identified. - Tricuspid valve: There was mild regurgitation. - Pulmonary arteries: PA peak pressure: 22 mm Hg (S).  Impressions:  - Mild LVH with LVEF 55-60% and grade 1 diastolic dysfunction. Mild mitral regurgitation. Mildly calcified aortic annulus with mild aortic regurgitation. Mild tricuspid regurgitation with normal estimated PASP 22 mmHg.  Labs/Other Tests and Data Reviewed:    EKG:  An ECG dated 12/05/2018 was personally reviewed today and demonstrated:  Sinus rhythm with borderline prolonged QT interval.  Recent Labs: 04/11/2018: TSH 1.97 12/05/2018: ALT 17; BUN 14;  Creatinine, Ser 0.70; Hemoglobin 11.4; Platelets 219; Potassium 3.8; Sodium 130    Wt Readings from Last 3 Encounters:  04/06/19 120 lb (54.4 kg)  12/05/18 115 lb (52.2 kg)  05/17/18 115 lb (52.2 kg)     Objective:    Vital Signs:  Ht 5\' 5"  (1.651 m)   Wt 120 lb (54.4 kg)   BMI 19.97 kg/m   Patient spoke in full sentences, not short of breath. No audible wheezing or coughing. Speech pattern normal.  ASSESSMENT & PLAN:    1  History of palpitations and documented PVCs.  She has been stable recently without worsening symptoms, no sudden dizziness or syncope.  We have continued strategy of observation, but have discussed considering trial of low-dose beta-blocker if symptoms worsen.  2.  Mixed hyperlipidemia, continues on low-dose Lipitor with follow-up by PCP.  COVID-19 Education: The signs and symptoms of COVID-19 were discussed with the patient and how to seek care for testing (follow up with PCP or arrange E-visit).  The importance of social distancing was discussed today.  Time:   Today, I have spent 8 minutes with the patient with telehealth technology discussing the above problems.     Medication Adjustments/Labs and Tests Ordered: Current medicines are reviewed at length with the patient today.  Concerns regarding medicines are outlined above.   Tests Ordered: No orders of the defined types were placed in this encounter.   Medication Changes: No orders of the defined types were placed in this encounter.   Follow Up:  In Person 8 months in the [place] office.  Signed, Rozann Lesches, MD  04/06/2019 9:10 AM    [place]

## 2019-04-05 NOTE — Telephone Encounter (Signed)

## 2019-04-06 ENCOUNTER — Telehealth: Payer: Self-pay | Admitting: Licensed Clinical Social Worker

## 2019-04-06 ENCOUNTER — Encounter: Payer: Self-pay | Admitting: Cardiology

## 2019-04-06 ENCOUNTER — Telehealth (INDEPENDENT_AMBULATORY_CARE_PROVIDER_SITE_OTHER): Payer: Medicare Other | Admitting: Cardiology

## 2019-04-06 VITALS — Ht 65.0 in | Wt 120.0 lb

## 2019-04-06 DIAGNOSIS — I493 Ventricular premature depolarization: Secondary | ICD-10-CM | POA: Diagnosis not present

## 2019-04-06 DIAGNOSIS — E782 Mixed hyperlipidemia: Secondary | ICD-10-CM

## 2019-04-06 NOTE — Patient Instructions (Signed)
Medication Instructions:  Your physician recommends that you continue on your current medications as directed. Please refer to the Current Medication list given to you today.  *If you need a refill on your cardiac medications before your next appointment, please call your pharmacy*  Lab Work: NONE If you have labs (blood work) drawn today and your tests are completely normal, you will receive your results only by: Marland Kitchen MyChart Message (if you have MyChart) OR . A paper copy in the mail If you have any lab test that is abnormal or we need to change your treatment, we will call you to review the results.  Testing/Procedures: NONE  Follow-Up: At [facility], you and your health needs are our priority.  As part of our continuing mission to provide you with exceptional heart care, we have created designated Provider Care Teams.  These Care Teams include your primary Cardiologist (physician) and Advanced Practice Providers (APPs -  Physician Assistants and Nurse Practitioners) who all work together to provide you with the care you need, when you need it.  Your next appointment:   8 month(s)  The format for your next appointment:   In Person  Provider:   Rozann Lesches, MD  Other Instructions NONE      Thank you for choosing [facility] !

## 2019-04-06 NOTE — Telephone Encounter (Signed)
CSW referred to assist patient with obtaining a BP cuff. CSW contacted patient to inform cuff will be delivered to home. Patient grateful for support and assistance. CSW available as needed. Jackie Pricella Gaugh, LCSW, CCSW-MCS [phone]  

## 2019-04-14 DIAGNOSIS — F329 Major depressive disorder, single episode, unspecified: Secondary | ICD-10-CM | POA: Diagnosis not present

## 2019-04-14 DIAGNOSIS — F419 Anxiety disorder, unspecified: Secondary | ICD-10-CM | POA: Diagnosis not present

## 2019-04-14 DIAGNOSIS — J329 Chronic sinusitis, unspecified: Secondary | ICD-10-CM | POA: Diagnosis not present

## 2019-04-20 DIAGNOSIS — Z23 Encounter for immunization: Secondary | ICD-10-CM | POA: Diagnosis not present

## 2019-05-17 DIAGNOSIS — F419 Anxiety disorder, unspecified: Secondary | ICD-10-CM | POA: Diagnosis not present

## 2019-05-17 DIAGNOSIS — G894 Chronic pain syndrome: Secondary | ICD-10-CM | POA: Diagnosis not present

## 2019-05-19 DIAGNOSIS — Z23 Encounter for immunization: Secondary | ICD-10-CM | POA: Diagnosis not present

## 2019-06-05 NOTE — Progress Notes (Signed)
No show

## 2019-06-06 ENCOUNTER — Inpatient Hospital Stay: Payer: Medicare Other

## 2019-06-06 ENCOUNTER — Encounter (HOSPITAL_BASED_OUTPATIENT_CLINIC_OR_DEPARTMENT_OTHER): Payer: Self-pay | Admitting: Oncology

## 2019-06-06 ENCOUNTER — Inpatient Hospital Stay: Payer: Medicare Other | Attending: Oncology | Admitting: Oncology

## 2019-06-06 DIAGNOSIS — C50411 Malignant neoplasm of upper-outer quadrant of right female breast: Secondary | ICD-10-CM

## 2019-06-06 DIAGNOSIS — Z170 Estrogen receptor positive status [ER+]: Secondary | ICD-10-CM

## 2019-06-15 DIAGNOSIS — F329 Major depressive disorder, single episode, unspecified: Secondary | ICD-10-CM | POA: Diagnosis not present

## 2019-06-15 DIAGNOSIS — G894 Chronic pain syndrome: Secondary | ICD-10-CM | POA: Diagnosis not present

## 2019-06-15 DIAGNOSIS — G4700 Insomnia, unspecified: Secondary | ICD-10-CM | POA: Diagnosis not present

## 2019-07-12 DIAGNOSIS — M150 Primary generalized (osteo)arthritis: Secondary | ICD-10-CM | POA: Diagnosis not present

## 2019-07-12 DIAGNOSIS — G894 Chronic pain syndrome: Secondary | ICD-10-CM | POA: Diagnosis not present

## 2019-07-12 DIAGNOSIS — F5101 Primary insomnia: Secondary | ICD-10-CM | POA: Diagnosis not present

## 2019-07-12 DIAGNOSIS — F419 Anxiety disorder, unspecified: Secondary | ICD-10-CM | POA: Diagnosis not present

## 2019-07-22 ENCOUNTER — Ambulatory Visit
Admission: EM | Admit: 2019-07-22 | Discharge: 2019-07-22 | Disposition: A | Discharge status: Home / Self Care | Payer: Medicare Other | Attending: Emergency Medicine | Admitting: Emergency Medicine

## 2019-07-22 ENCOUNTER — Other Ambulatory Visit: Payer: Self-pay

## 2019-07-22 DIAGNOSIS — L03116 Cellulitis of left lower limb: Secondary | ICD-10-CM

## 2019-07-22 MED ORDER — SULFAMETHOXAZOLE-TRIMETHOPRIM 800-160 MG PO TABS: 1.0000 | ORAL_TABLET | Freq: Two times a day (BID) | ORAL | 0 refills | Status: DC

## 2019-07-22 NOTE — ED Provider Notes (Addendum)
[facility]    CSN: [id number] Arrival date & time: 07/22/19  1556      History   Chief Complaint Chief Complaint  Patient presents with  . Leg Swelling    HPI Marilyn Haas is a 79 y.o. female.   Who presented to the urgent care with a complaint of left leg redness and swelling for the past 1 to 2 weeks.  Reports she has a pedicure done and then develop the symptom thereafter.  Localized redness and swelling to the left leg.  Described as uncomfortable with mild pain.  She has tried OTC Tylenol/ibuprofen without relief.  Her symptoms are made worse with range of motion.  She denies similar symptoms in the past.  Denies chills, fever, nausea, vomiting, diarrhea or past history of cellulitis.  The history is provided by the patient. No language interpreter was used.    Past Medical History:  Diagnosis Date  . Anemia   . Anxiety   . Arthritis   . Breast cancer ([place]) 05/19/2016   Right breast  . Childhood asthma   . Chronic back pain    Scoliosis, stenosis  . Depression   . Diverticulosis   . GERD (gastroesophageal reflux disease)   . Hard of hearing   . Headache   . History of blood transfusion   . History of colon polyps   . History of hiatal hernia   . History of pneumonia   . Insomnia   . Osteopenia   . Osteoporosis   . Personal history of radiation therapy   . Thickened endometrium 09/29/2017    Patient Active Problem List   Diagnosis Date Noted  . MDD (major depressive disorder), recurrent episode, moderate ([place]) 06/13/2018  . Thickened endometrium 09/29/2017  . LLQ fullness 09/23/2017  . History of breast cancer 09/23/2017  . Screening for colorectal cancer 09/23/2017  . Routine cervical smear 09/23/2017  . Encounter for gynecological examination with Papanicolaou smear of cervix 09/23/2017  . Dysphagia 09/01/2017  . 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 ([place]) 08/13/2016  . Malignant neoplasm of upper-outer quadrant of  right breast in female, estrogen receptor positive ([place]) 06/22/2016  . Osteopenia determined by x-ray 06/22/2016  . Hyponatremia 10/12/2015  . Syncope 10/11/2015  . Acute pancreatitis 10/03/2015  . Other fatigue 12/16/2014  . PVC's (premature ventricular contractions) 12/16/2014  . Pain with hip hemiarthroplasty ([place]) 07/04/2014  . Left hip pain 01/31/2013  . Anemia 08/09/2012  . B12 deficiency anemia 08/09/2012  . Vitamin D deficiency 08/09/2012  . Anxiety 08/09/2012  . Palpitations 08/09/2012  . Hyperlipidemia 05/25/2011  . Hypertension 05/25/2011  . Dysphagia 05/25/2011    Past Surgical History:  Procedure Laterality Date  . BREAST LUMPECTOMY WITH RADIOACTIVE SEED AND SENTINEL LYMPH NODE BIOPSY Right 07/31/2016   Procedure: BREAST LUMPECTOMY WITH RADIOACTIVE SEED AND SENTINEL LYMPH NODE BIOPSY, INJECT BLUE DYE RIGHT BREAST;  Surgeon: Fanny Skates, MD;  Location: [place];  Service: General;  Laterality: Right;  . COLONOSCOPY    . 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 ESOPHAGEAL DILATION N/A 09/24/2017   Procedure: ESOPHAGEAL DILATION;  Surgeon: Rogene Houston, MD;  [facility];  Service: Endoscopy;  Laterality: N/A;  . ESOPHAGOGASTRODUODENOSCOPY N/A 01/26/2014   Procedure: ESOPHAGOGASTRODUODENOSCOPY (EGD);  Surgeon: Rogene Houston, MD;  [facility];  Service: Endoscopy;  Laterality: N/A;  1030  . ESOPHAGOGASTRODUODENOSCOPY (EGD) WITH PROPOFOL N/A 09/24/2017   Procedure: ESOPHAGOGASTRODUODENOSCOPY (EGD) WITH PROPOFOL;  Surgeon: Rogene Houston, MD;  [facility];  Service: Endoscopy;  Laterality: N/A;  11:15  . HIP FRACTURE SURGERY     05/2009 left -Mayer Camel  . HYSTEROSCOPY WITH D & C  05/24/2018   Procedure: DILATATION AND CURETTAGE /HYSTEROSCOPY (PROCEDURE # 2)/PAP SMEAR (PROCEDURE #1);  Surgeon:  Jonnie Kind, MD;  [facility];  Service: Gynecology;;  . Venia Minks DILATION N/A 01/26/2014   Procedure: Keturah Shavers;  Surgeon: Rogene Houston, MD;  [facility];  Service: Endoscopy;  Laterality: N/A;    OB History    Gravida  3   Para  3   Term  3   Preterm      AB      Living  3     SAB      TAB      Ectopic      Multiple      Live Births  3            Home Medications    Prior to Admission medications   Medication Sig Start Date End Date Taking? Authorizing Provider  ALPRAZolam (XANAX) 0.25 MG tablet Take 0.25 mg by mouth at bedtime.   Yes [provider]  atorvastatin (LIPITOR) 10 MG tablet Take 10 mg by mouth daily.    [provider]  cyanocobalamin (,VITAMIN B-12,) 1000 MCG/ML injection Inject 1,000 mcg into the muscle every 30 (thirty) days.    [provider]  docusate sodium (COLACE) 100 MG capsule Take 2 capsules (200 mg total) by mouth daily. Patient taking differently: Take 200-300 mg by mouth daily.  08/31/17   Rehman, Mechele Dawley, MD  famotidine (PEPCID) 20 MG tablet TAKE 1 TABLET (20 MG TOTAL) BY MOUTH TWICE A DAY 06/16/18   Rehman, Mechele Dawley, MD  ferrous sulfate 325 (65 FE) MG tablet Take 325 mg by mouth daily with breakfast.    [provider]  gabapentin (NEURONTIN) 100 MG capsule Take 1 capsule (100 mg total) by mouth at bedtime. 09/07/18   Magrinat, Virgie Dad, MD  HYDROcodone-acetaminophen (NORCO) 10-325 MG tablet Take 1 tablet by mouth 3 (three) times daily as needed for moderate pain.     [provider]  metoCLOPramide (REGLAN) 10 MG tablet Take 1 tablet (10 mg total) by mouth every 8 (eight) hours as needed for nausea (nausea/headache). 12/05/18   Ripley Fraise, MD  mirtazapine (REMERON) 15 MG tablet Take 1 tablet (15 mg total) by mouth at bedtime. 12/06/18   Norman Clay, MD  pantoprazole (PROTONIX) 40 MG tablet TAKE ONE (1) TABLET EACH DAY 06/16/18   Rehman, Mechele Dawley, MD   Polyethyl Glycol-Propyl Glycol (SYSTANE OP) Place 1 drop into both eyes daily as needed (for dry eyes).    [provider]  polyethylene glycol powder (GLYCOLAX/MIRALAX) powder Take 8.5 g by mouth daily. Patient taking differently: Take 17 g by mouth daily as needed for moderate constipation.  08/31/17   Rehman, Mechele Dawley, MD  sulfamethoxazole-trimethoprim (BACTRIM DS) 800-160 MG tablet Take 1 tablet by mouth 2 (two) times daily for 7 days. 07/23/19 07/30/19  Christain Mcraney, Darrelyn Hillock, FNP  tamoxifen (NOLVADEX) 20 MG tablet Take 1 tablet (20 mg total) by mouth daily. 03/07/19   Magrinat, Virgie Dad, MD  venlafaxine XR (EFFEXOR-XR) 150 MG 24 hr capsule 225 mg daily (150 mg + 75  mg) 03/05/19   Norman Clay, MD  venlafaxine XR (EFFEXOR-XR) 75 MG 24 hr capsule 225 mg daily (150 mg + 75 mg) 01/24/19   Hisada, Elie Goody, MD  zolpidem (AMBIEN) 5 MG tablet Take 5 mg by mouth at bedtime as needed.    [provider]    Family History Family History  Problem Relation Age of Onset  . Heart failure Mother   . Diabetes Mother   . Hypertension Mother   . Depression Mother   . Other Father        stomach ulcers; abd aneursym  . Hypertension Sister   . Heart failure Sister   . Diabetes Sister   . Depression Sister   . Diabetes Sister        prediabetes  . Cancer Maternal Aunt        breast    Social History Social History   Tobacco Use  . Smoking status: Former Smoker    Packs/day: 0.50    Years: 2.00    Pack years: 1.00    Types: Cigarettes    Quit date: 01/15/1961    Years since quitting: 58.5  . Smokeless tobacco: Never Used  Substance Use Topics  . Alcohol use: No    Alcohol/week: 0.0 standard drinks  . Drug use: No     Allergies   Avelox [moxifloxacin hcl in nacl]   Review of Systems Review of Systems  Respiratory: Negative.   Cardiovascular: Negative.   Skin: Positive for color change.  All other systems reviewed and are negative.    Physical Exam Triage Vital  Signs ED Triage Vitals [07/22/19 1608]  Enc Vitals Group     BP (!) 153/91     Pulse Rate 96     Resp 12     Temp 97.9 F (36.6 C)     Temp Source Oral     SpO2 95 %     Weight 120 lb (54.4 kg)     Height      Head Circumference      Peak Flow      Pain Score 0     Pain Loc      Pain Edu?      Excl. in [place]?    No data found.  Updated Vital Signs BP (!) 153/91 (BP Location: Right Arm)   Pulse 96   Temp 97.9 F (36.6 C) (Oral)   Resp 12   Wt 120 lb (54.4 kg)   SpO2 95%   BMI 19.97 kg/m   Visual Acuity Right Eye Distance:   Left Eye Distance:   Bilateral Distance:    Right Eye Near:   Left Eye Near:    Bilateral Near:     Physical Exam Vitals and nursing note reviewed.  Constitutional:      General: She is not in acute distress.    Appearance: Normal appearance. She is normal weight. She is not ill-appearing, toxic-appearing or diaphoretic.  Cardiovascular:     Rate and Rhythm: Normal rate and regular rhythm.     Pulses: Normal pulses.     Heart sounds: Normal heart sounds. No murmur. No friction rub. No gallop.   Pulmonary:     Effort: Pulmonary effort is normal. No respiratory distress.     Breath sounds: Normal breath sounds. No stridor. No wheezing, rhonchi or rales.  Chest:     Chest wall: No tenderness.  Musculoskeletal:        General: Swelling present.  Right lower leg: Normal.     Left lower leg: Tenderness present. 1+ Edema present.     Comments: Patient is able to ambulate without difficulty.  Normal range of motion.  Skin:    General: Skin is warm.     Findings: Erythema present.  Neurological:     General: No focal deficit present.     Mental Status: She is alert and oriented to person, place, and time.     Cranial Nerves: No cranial nerve deficit.     Sensory: No sensory deficit.     Motor: No weakness.     Coordination: Coordination normal.      UC Treatments / Results  Labs (all labs ordered are listed, but only abnormal  results are displayed) Labs Reviewed - No data to display  EKG   Radiology No results found.  Procedures Procedures (including critical care time)  Medications Ordered in UC Medications - No data to display  Initial Impression / Assessment and Plan / UC Course  I have reviewed the triage vital signs and the nursing notes.  Pertinent labs & imaging results that were available during my care of the patient were reviewed by me and considered in my medical decision making (see chart for details).    Patient is stable at discharge.  Symptom is likely from cellulitis.  Will prescribe Bactrim DS.  Advised to follow-up with PCP or to return for worsening of symptoms.  Final Clinical Impressions(s) / UC Diagnoses   Final diagnoses:  Cellulitis of leg, left     Discharge Instructions     Take antibiotic as prescribed until completion Follow-up with PCP Return or go to ED for worsening of symptoms    ED Prescriptions    Medication Sig Dispense Auth. Provider   sulfamethoxazole-trimethoprim (BACTRIM DS) 800-160 MG tablet Take 1 tablet by mouth 2 (two) times daily for 7 days. 14 tablet Haizel Gatchell, Darrelyn Hillock, FNP     I have reviewed the PDMP during this encounter.       Emerson Monte, FNP 07/23/19 [phone]

## 2019-07-22 NOTE — ED Triage Notes (Signed)
Patient complaining of left leg redness and swelling that she noticed 1-2 weeks ago after getting a pedicure.

## 2019-07-22 NOTE — Discharge Instructions (Addendum)
Take antibiotic as prescribed until completion Follow-up with PCP Return or go to ED for worsening of symptoms

## 2019-07-23 ENCOUNTER — Telehealth: Payer: Self-pay | Admitting: Emergency Medicine

## 2019-07-23 MED ORDER — SULFAMETHOXAZOLE-TRIMETHOPRIM 800-160 MG PO TABS: 1.0000 | ORAL_TABLET | Freq: Two times a day (BID) | ORAL | 0 refills | Status: AC

## 2019-07-23 NOTE — Telephone Encounter (Signed)
Patient was unable to get her medication as the pharmacy was closed.  She is here this morning to get her medication to be resent to [facility] in [place].

## 2019-08-04 ENCOUNTER — Ambulatory Visit
Admission: EM | Admit: 2019-08-04 | Discharge: 2019-08-04 | Disposition: A | Discharge status: Home / Self Care | Payer: Medicare Other | Attending: Family Medicine | Admitting: Family Medicine

## 2019-08-04 ENCOUNTER — Other Ambulatory Visit: Payer: Self-pay

## 2019-08-04 DIAGNOSIS — M79605 Pain in left leg: Secondary | ICD-10-CM

## 2019-08-04 MED ORDER — DOXYCYCLINE HYCLATE 100 MG PO CAPS: 100.0000 mg | ORAL_CAPSULE | Freq: Two times a day (BID) | ORAL | 0 refills | Status: DC

## 2019-08-04 NOTE — ED Provider Notes (Signed)
[place]   [id number] 08/04/19 Arrival Time: 41  CC: RASH  SUBJECTIVE:  Marilyn Haas is a 79 y.o. female who presents with a skin complaint that began about 1 week ago. She was seen in this urgent care, was diagnosed with cellulitis and was prescribed Bactrim. Patient reports that the area appears to be healing and feels somewhat better than when she was here last. Localizes the rash to lower left leg. Describes it as red and warm to touch. There are no aggravating symptoms. Denies calf pain with walking. Denies ever having a blood clot in the past. Denies fever, chills, nausea, vomiting, erythema, swelling, discharge, oral lesions, SOB, chest pain, abdominal pain, changes in bowel or bladder function.    ROS: As per HPI.  All other pertinent ROS negative.     Past Medical History:  Diagnosis Date  . Anemia   . Anxiety   . Arthritis   . Breast cancer ([place]) 05/19/2016   Right breast  . Childhood asthma   . Chronic back pain    Scoliosis, stenosis  . Depression   . Diverticulosis   . GERD (gastroesophageal reflux disease)   . Hard of hearing   . Headache   . History of blood transfusion   . History of colon polyps   . History of hiatal hernia   . History of pneumonia   . Insomnia   . Osteopenia   . Osteoporosis   . Personal history of radiation therapy   . Thickened endometrium 09/29/2017   Past Surgical History:  Procedure Laterality Date  . BREAST LUMPECTOMY WITH RADIOACTIVE SEED AND SENTINEL LYMPH NODE BIOPSY Right 07/31/2016   Procedure: BREAST LUMPECTOMY WITH RADIOACTIVE SEED AND SENTINEL LYMPH NODE BIOPSY, INJECT BLUE DYE RIGHT BREAST;  Surgeon: Fanny Skates, MD;  Location: [facility];  Service: General;  Laterality: Right;  . COLONOSCOPY    . 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 ESOPHAGEAL DILATION N/A  09/24/2017   Procedure: ESOPHAGEAL DILATION;  Surgeon: Rogene Houston, MD;  [facility];  Service: Endoscopy;  Laterality: N/A;  . ESOPHAGOGASTRODUODENOSCOPY N/A 01/26/2014   Procedure: ESOPHAGOGASTRODUODENOSCOPY (EGD);  Surgeon: Rogene Houston, MD;  [facility];  Service: Endoscopy;  Laterality: N/A;  1030  . ESOPHAGOGASTRODUODENOSCOPY (EGD) WITH PROPOFOL N/A 09/24/2017   Procedure: ESOPHAGOGASTRODUODENOSCOPY (EGD) WITH PROPOFOL;  Surgeon: Rogene Houston, MD;  [facility];  Service: Endoscopy;  Laterality: N/A;  11:15  . HIP FRACTURE SURGERY     05/2009 left -Mayer Camel  . HYSTEROSCOPY WITH D & C  05/24/2018   Procedure: DILATATION AND CURETTAGE /HYSTEROSCOPY (PROCEDURE # 2)/PAP SMEAR (PROCEDURE #1);  Surgeon: Jonnie Kind, MD;  [facility];  Service: Gynecology;;  . Venia Minks DILATION N/A 01/26/2014   Procedure: Keturah Shavers;  Surgeon: Rogene Houston, MD;  [facility];  Service: Endoscopy;  Laterality: N/A;   Allergies  Allergen Reactions  . Avelox [Moxifloxacin Hcl In Nacl] Other (See Comments)    Altered mental status   No current facility-administered medications on file prior to encounter.   Current Outpatient Medications on File Prior to Encounter  Medication Sig Dispense Refill  . ALPRAZolam (XANAX) 0.25 MG tablet Take 0.25 mg by mouth at bedtime.    Marland Kitchen atorvastatin (LIPITOR) 10 MG tablet Take 10 mg by mouth daily.    Marland Kitchen  cyanocobalamin (,VITAMIN B-12,) 1000 MCG/ML injection Inject 1,000 mcg into the muscle every 30 (thirty) days.    Marland Kitchen docusate sodium (COLACE) 100 MG capsule Take 2 capsules (200 mg total) by mouth daily. (Patient taking differently: Take 200-300 mg by mouth daily. ) 10 capsule 0  . famotidine (PEPCID) 20 MG tablet TAKE 1 TABLET (20 MG TOTAL) BY MOUTH TWICE A DAY 60 tablet 11  . ferrous sulfate 325 (65 FE) MG tablet Take 325 mg by mouth daily with breakfast.    . gabapentin (NEURONTIN) 100 MG capsule Take 1  capsule (100 mg total) by mouth at bedtime. 30 capsule 3  . HYDROcodone-acetaminophen (NORCO) 10-325 MG tablet Take 1 tablet by mouth 3 (three) times daily as needed for moderate pain.     Marland Kitchen metoCLOPramide (REGLAN) 10 MG tablet Take 1 tablet (10 mg total) by mouth every 8 (eight) hours as needed for nausea (nausea/headache). 10 tablet 0  . mirtazapine (REMERON) 15 MG tablet Take 1 tablet (15 mg total) by mouth at bedtime. 90 tablet 0  . pantoprazole (PROTONIX) 40 MG tablet TAKE ONE (1) TABLET EACH DAY 30 tablet 3  . Polyethyl Glycol-Propyl Glycol (SYSTANE OP) Place 1 drop into both eyes daily as needed (for dry eyes).    . polyethylene glycol powder (GLYCOLAX/MIRALAX) powder Take 8.5 g by mouth daily. (Patient taking differently: Take 17 g by mouth daily as needed for moderate constipation. ) 255 g 0  . tamoxifen (NOLVADEX) 20 MG tablet Take 1 tablet (20 mg total) by mouth daily. 90 tablet 2  . venlafaxine XR (EFFEXOR-XR) 150 MG 24 hr capsule 225 mg daily (150 mg + 75 mg) 90 capsule 0  . venlafaxine XR (EFFEXOR-XR) 75 MG 24 hr capsule 225 mg daily (150 mg + 75 mg) 90 capsule 0  . zolpidem (AMBIEN) 5 MG tablet Take 5 mg by mouth at bedtime as needed.     Social History   Socioeconomic History  . Marital status: Married    Spouse name: Not on file  . Number of children: 3  . Years of education: Not on file  . Highest education level: Not on file  Occupational History    Employer: RETIRED  Tobacco Use  . Smoking status: Former Smoker    Packs/day: 0.50    Years: 2.00    Pack years: 1.00    Types: Cigarettes    Quit date: 01/15/1961    Years since quitting: 58.5  . Smokeless tobacco: Never Used  Substance and Sexual Activity  . Alcohol use: No    Alcohol/week: 0.0 standard drinks  . Drug use: No  . Sexual activity: Not Currently    Birth control/protection: Post-menopausal  Other Topics Concern  . Not on file  Social History Narrative  . Not on file   Social Determinants of  Health   Financial Resource Strain:   . Difficulty of Paying Living Expenses:   Food Insecurity:   . Worried About Charity fundraiser in the Last Year:   . Arboriculturist in the Last Year:   Transportation Needs:   . Film/video editor (Medical):   Marland Kitchen Lack of Transportation (Non-Medical):   Physical Activity:   . Days of Exercise per Week:   . Minutes of Exercise per Session:   Stress:   . Feeling of Stress :   Social Connections:   . Frequency of Communication with Friends and Family:   . Frequency of Social Gatherings with Friends and Family:   .  Attends Religious Services:   . Active Member of Clubs or Organizations:   . Attends Archivist Meetings:   Marland Kitchen Marital Status:   Intimate Partner Violence:   . Fear of Current or Ex-Partner:   . Emotionally Abused:   Marland Kitchen Physically Abused:   . Sexually Abused:    Family History  Problem Relation Age of Onset  . Heart failure Mother   . Diabetes Mother   . Hypertension Mother   . Depression Mother   . Other Father        stomach ulcers; abd aneursym  . Hypertension Sister   . Heart failure Sister   . Diabetes Sister   . Depression Sister   . Diabetes Sister        prediabetes  . Cancer Maternal Aunt        breast    OBJECTIVE: Vitals:   08/04/19 1311  BP: (!) 167/92  Pulse: 94  Resp: 18  Temp: (!) 97.5 F (36.4 C)  SpO2: 95%    General appearance: alert; no distress Head: NCAT Lungs: clear to auscultation bilaterally Heart: regular rate and rhythm.  Radial pulse 2+ bilaterally Extremities: no edema Skin: warm and dry; erythema, edema, and warmth to medial aspect of the left lower leg, area spans from medial aspect of left ankle to just below the calf. Psychological: alert and cooperative; normal mood and affect  ASSESSMENT & PLAN:  1. Left leg pain     Meds ordered this encounter  Medications  . doxycycline (VIBRAMYCIN) 100 MG capsule    Sig: Take 1 capsule (100 mg total) by mouth 2 (two)  times daily.    Dispense:  14 capsule    Refill:  0    Order Specific Question:   Supervising Provider    Answer:   Chase Picket [id number]     Cellulitis Prescribed doxycycline Take as prescribed and to completion Avoid hot showers/ baths Moisturize skin daily  Follow up with PCP if symptoms persists Return or go to the ER if you have any new or worsening symptoms such as fever, chills, nausea, vomiting, redness, swelling, discharge, if symptoms do not improve with medications.  Reviewed expectations re: course of current medical issues. Questions answered. Outlined signs and symptoms indicating need for more acute intervention. Patient verbalized understanding. After Visit Summary given.   Faustino Congress, NP 08/04/19 1401

## 2019-08-04 NOTE — ED Triage Notes (Signed)
Pt c/o left leg swelling x 3 weeks ago. Pt had round of Bactrim without improvement.

## 2019-08-04 NOTE — Discharge Instructions (Signed)
I think that you still have a little cellulitis   I have sent in doxycycline for you to take twice a day for 7 days.  If you are not noticing improvement with these medications, follow up with this office or with your primary care provider.

## 2019-08-09 DIAGNOSIS — G894 Chronic pain syndrome: Secondary | ICD-10-CM | POA: Diagnosis not present

## 2019-08-09 DIAGNOSIS — F419 Anxiety disorder, unspecified: Secondary | ICD-10-CM | POA: Diagnosis not present

## 2019-08-09 DIAGNOSIS — F330 Major depressive disorder, recurrent, mild: Secondary | ICD-10-CM | POA: Diagnosis not present

## 2019-08-09 DIAGNOSIS — R7309 Other abnormal glucose: Secondary | ICD-10-CM | POA: Diagnosis not present

## 2019-08-09 DIAGNOSIS — C50919 Malignant neoplasm of unspecified site of unspecified female breast: Secondary | ICD-10-CM | POA: Diagnosis not present

## 2019-08-09 DIAGNOSIS — Z6823 Body mass index (BMI) 23.0-23.9, adult: Secondary | ICD-10-CM | POA: Diagnosis not present

## 2019-08-09 DIAGNOSIS — R609 Edema, unspecified: Secondary | ICD-10-CM | POA: Diagnosis not present

## 2019-08-22 ENCOUNTER — Telehealth: Payer: Self-pay

## 2019-08-22 NOTE — Telephone Encounter (Signed)
Apt changed to 6/11 at 1:30 pm with B.Strader, PA-C, son [name] made aware

## 2019-08-22 NOTE — Telephone Encounter (Signed)
Son requested sooner appt. Son feels Pt is retaining fluid and does not feel Pt is taking medications.   Please call Son [phone]  Thanks renee

## 2019-08-24 NOTE — Progress Notes (Signed)
Cardiology Office Note    Date:  08/25/2019   ID:  [name], [name] May 15, 1940, MRN [id number]  PCP:  Redmond School, MD  Cardiologist: Rozann Lesches, MD    Chief Complaint  Patient presents with  . Follow-up    Lower Extremity Edema    History of Present Illness:    Marilyn Haas is a 79 y.o. female with past medical history of HLD, palpitations (PVC's by prior monitor) and breast cancer (s/p lumpectomy) who presents to the office today for 69-month follow-up.  She most recently had a telehealth visit with Dr. Domenic Polite in 03/2019 and denied any specific chest pain, palpitations or dizziness at that time. The use of beta-blocker therapy had previously been reviewed with the patient but she declined at that time and continued observation was recommended.   Her son did reach out to the office in the interim to report she was experiencing worsening lower extremity edema and follow-up was arranged. By review of notes, she was treated for cellulitis at Urgent Care in 07/2019.  In talking with the patient today, she reports having worsening lower extremity edema over the past few months. She was treated for cellulitis as outlined above and experienced improvement in her edema. Swelling has been most notable along her left leg. She was prescribed Lasix 20mg  by her PCP but has not utilized this. She denies any recent dyspnea on exertion, orthopnea, PND, chest pain or palpitations.   She has been consuming fast food for a majority of her meals (2-3 times daily) and reports consuming 5 to 6 cans or bottles of Diet Coke daily.   Past Medical History:  Diagnosis Date  . Anemia   . Anxiety   . Arthritis   . Breast cancer ([place]) 05/19/2016   Right breast  . Childhood asthma   . Chronic back pain    Scoliosis, stenosis  . Depression   . Diverticulosis   . GERD (gastroesophageal reflux disease)   . Hard of hearing   . Headache   . History of blood transfusion   . History of  colon polyps   . History of hiatal hernia   . History of pneumonia   . Insomnia   . Osteopenia   . Osteoporosis   . Personal history of radiation therapy   . Thickened endometrium 09/29/2017    Past Surgical History:  Procedure Laterality Date  . BREAST LUMPECTOMY WITH RADIOACTIVE SEED AND SENTINEL LYMPH NODE BIOPSY Right 07/31/2016   Procedure: BREAST LUMPECTOMY WITH RADIOACTIVE SEED AND SENTINEL LYMPH NODE BIOPSY, INJECT BLUE DYE RIGHT BREAST;  Surgeon: Fanny Skates, MD;  Location: [facility];  Service: General;  Laterality: Right;  . COLONOSCOPY    . 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 ESOPHAGEAL DILATION N/A 09/24/2017   Procedure: ESOPHAGEAL DILATION;  Surgeon: Rogene Houston, MD;  [facility]: [facility];  Service: Endoscopy;  Laterality: N/A;  . ESOPHAGOGASTRODUODENOSCOPY N/A 01/26/2014   Procedure: ESOPHAGOGASTRODUODENOSCOPY (EGD);  Surgeon: Rogene Houston, MD;  [facility];  Service: Endoscopy;  Laterality: N/A;  1030  . ESOPHAGOGASTRODUODENOSCOPY (EGD) WITH PROPOFOL N/A 09/24/2017   Procedure: ESOPHAGOGASTRODUODENOSCOPY (EGD) WITH PROPOFOL;  Surgeon: Rogene Houston, MD;  [facility];  Service: Endoscopy;  Laterality: N/A;  11:15  . HIP FRACTURE SURGERY     05/2009 left -[facility]  .  HYSTEROSCOPY WITH D & C  05/24/2018   Procedure: DILATATION AND CURETTAGE /HYSTEROSCOPY (PROCEDURE # 2)/PAP SMEAR (PROCEDURE #1);  Surgeon: Jonnie Kind, MD;  Location: [facility];  Service: Gynecology;;  . Venia Minks DILATION N/A 01/26/2014   Procedure: Keturah Shavers;  Surgeon: Rogene Houston, MD;  [facility];  Service: Endoscopy;  Laterality: N/A;    Current Medications: Outpatient Medications Prior to Visit  Medication Sig Dispense Refill  . ALPRAZolam (XANAX) 0.25 MG tablet Take 0.25 mg by mouth at bedtime.      Marland Kitchen atorvastatin (LIPITOR) 10 MG tablet Take 10 mg by mouth daily.    . cyanocobalamin (,VITAMIN B-12,) 1000 MCG/ML injection Inject 1,000 mcg into the muscle every 30 (thirty) days.    Marland Kitchen docusate sodium (COLACE) 100 MG capsule Take 2 capsules (200 mg total) by mouth daily. (Patient taking differently: Take 200-300 mg by mouth daily. ) 10 capsule 0  . famotidine (PEPCID) 20 MG tablet TAKE 1 TABLET (20 MG TOTAL) BY MOUTH TWICE A DAY 60 tablet 11  . ferrous sulfate 325 (65 FE) MG tablet Take 325 mg by mouth daily with breakfast.    . gabapentin (NEURONTIN) 100 MG capsule Take 1 capsule (100 mg total) by mouth at bedtime. 30 capsule 3  . HYDROcodone-acetaminophen (NORCO) 10-325 MG tablet Take 1 tablet by mouth 3 (three) times daily as needed for moderate pain.     . mirtazapine (REMERON) 15 MG tablet Take 1 tablet (15 mg total) by mouth at bedtime. 90 tablet 0  . pantoprazole (PROTONIX) 40 MG tablet TAKE ONE (1) TABLET EACH DAY 30 tablet 3  . Polyethyl Glycol-Propyl Glycol (SYSTANE OP) Place 1 drop into both eyes daily as needed (for dry eyes).    . polyethylene glycol powder (GLYCOLAX/MIRALAX) powder Take 8.5 g by mouth daily. (Patient taking differently: Take 17 g by mouth daily as needed for moderate constipation. ) 255 g 0  . zolpidem (AMBIEN) 5 MG tablet Take 5 mg by mouth at bedtime as needed.    . ARIPiprazole (ABILIFY) 2 MG tablet Take 2 mg by mouth daily.    . DULoxetine (CYMBALTA) 60 MG capsule Take 120 mg by mouth daily.    . metoCLOPramide (REGLAN) 10 MG tablet Take 1 tablet (10 mg total) by mouth every 8 (eight) hours as needed for nausea (nausea/headache). (Patient not taking: Reported on 08/25/2019) 10 tablet 0  . tamoxifen (NOLVADEX) 20 MG tablet Take 1 tablet (20 mg total) by mouth daily. 90 tablet 2  . doxycycline (VIBRAMYCIN) 100 MG capsule Take 1 capsule (100 mg total) by mouth 2 (two) times daily. 14 capsule 0  . venlafaxine XR (EFFEXOR-XR) 150 MG 24 hr capsule 225 mg daily (150 mg  + 75 mg) 90 capsule 0  . venlafaxine XR (EFFEXOR-XR) 75 MG 24 hr capsule 225 mg daily (150 mg + 75 mg) 90 capsule 0   No facility-administered medications prior to visit.     Allergies:   Avelox [moxifloxacin hcl in nacl]   Social History   Socioeconomic History  . Marital status: Married    Spouse name: Not on file  . Number of children: 3  . Years of education: Not on file  . Highest education level: Not on file  Occupational History    Employer: RETIRED  Tobacco Use  . Smoking status: Former Smoker    Packs/day: 0.50    Years: 2.00    Pack years: 1.00    Types: Cigarettes  Quit date: 01/15/1961    Years since quitting: 58.6  . Smokeless tobacco: Never Used  Vaping Use  . Vaping Use: Never used  Substance and Sexual Activity  . Alcohol use: No    Alcohol/week: 0.0 standard drinks  . Drug use: No  . Sexual activity: Not Currently    Birth control/protection: Post-menopausal  Other Topics Concern  . Not on file  Social History Narrative  . Not on file   Social Determinants of Health   Financial Resource Strain:   . Difficulty of Paying Living Expenses:   Food Insecurity:   . Worried About Charity fundraiser in the Last Year:   . Arboriculturist in the Last Year:   Transportation Needs:   . Film/video editor (Medical):   Marland Kitchen Lack of Transportation (Non-Medical):   Physical Activity:   . Days of Exercise per Week:   . Minutes of Exercise per Session:   Stress:   . Feeling of Stress :   Social Connections:   . Frequency of Communication with Friends and Family:   . Frequency of Social Gatherings with Friends and Family:   . Attends Religious Services:   . Active Member of Clubs or Organizations:   . Attends Archivist Meetings:   Marland Kitchen Marital Status:      Family History:  The patient's family history includes Cancer in her maternal aunt; Depression in her mother and sister; Diabetes in her mother, sister, and sister; Heart failure in her mother  and sister; Hypertension in her mother and sister; Other in her father.   Review of Systems:   Please see the history of present illness.     General:  No chills, fever, night sweats or weight changes.  Cardiovascular:  No chest pain, dyspnea on exertion, orthopnea, palpitations, paroxysmal nocturnal dyspnea. Positive for edema.  Dermatological: No rash, lesions/masses Respiratory: No cough, dyspnea Urologic: No hematuria, dysuria Abdominal:   No nausea, vomiting, diarrhea, bright red blood per rectum, melena, or hematemesis Neurologic:  No visual changes, wkns, changes in mental status. All other systems reviewed and are otherwise negative except as noted above.   Physical Exam:    VS:  BP 138/82   Pulse 92   Ht 5\' 5"  (1.651 m)   Wt 138 lb (62.6 kg)   SpO2 97%   BMI 22.96 kg/m    General: Well developed, elderly female appearing in no acute distress. Head: Normocephalic, atraumatic, sclera non-icteric.  Neck: No carotid bruits. JVD not elevated.  Lungs: Respirations regular and unlabored, without wheezes or rales.  Heart: Regular rate and rhythm. No S3 or S4.  No murmur, no rubs, or gallops appreciated. Abdomen: Soft, non-tender, non-distended. No obvious abdominal masses. Msk:  Strength and tone appear normal for age. No obvious joint deformities or effusions. Extremities: No clubbing or cyanosis. 1+ pitting edema along LLE, trace edema along RLE.  Distal pedal pulses are 2+ bilaterally. Neuro: Alert and oriented X 3. Moves all extremities spontaneously. No focal deficits noted. Psych:  Responds to questions appropriately with a normal affect. Skin: No rashes or lesions noted  Wt Readings from Last 3 Encounters:  08/25/19 138 lb (62.6 kg)  07/22/19 120 lb (54.4 kg)  04/06/19 120 lb (54.4 kg)     Studies/Labs Reviewed:   EKG:  EKG is not ordered today. EKG from 12/05/2019 is reviewed which shows NSR, HR 86 with no acute ST abnormalities when compared to prior tracings.    Recent Labs:  12/05/2018: ALT 17; BUN 14; Creatinine, Ser 0.70; Hemoglobin 11.4; Platelets 219; Potassium 3.8; Sodium 130   Lipid Panel    Component Value Date/Time   CHOL 209 (H) 12/13/2014 1132   CHOL 150 02/17/2013 0934   TRIG 95 12/13/2014 1132   TRIG 111 02/17/2013 0934   HDL 114 12/13/2014 1132   HDL 40 02/17/2013 0934   CHOLHDL 1.8 12/13/2014 1132   VLDL 19 12/13/2014 1132   LDLCALC 76 12/13/2014 1132   LDLCALC 88 02/17/2013 0934    Additional studies/ records that were reviewed today include:   Echocardiogram: 02/2016 Study Conclusions   - Left ventricle: The cavity size was normal. Wall thickness was  increased in a pattern of mild LVH. Systolic function was normal.  The estimated ejection fraction was in the range of 55% to 60%.  Wall motion was normal; there were no regional wall motion  abnormalities. Doppler parameters are consistent with abnormal  left ventricular relaxation (grade 1 diastolic dysfunction).  - Aortic valve: Mildly calcified annulus. Trileaflet. There was  mild regurgitation.  - Mitral valve: There was mild regurgitation.  - Right atrium: Central venous pressure (est): 3 mm Hg.  - Atrial septum: No defect or patent foramen ovale was identified.  - Tricuspid valve: There was mild regurgitation.  - Pulmonary arteries: PA peak pressure: 22 mm Hg (S).   Impressions:   - Mild LVH with LVEF 55-60% and grade 1 diastolic dysfunction. Mild  mitral regurgitation. Mildly calcified aortic annulus with mild  aortic regurgitation. Mild tricuspid regurgitation with normal  estimated PASP 22 mmHg.   Assessment:    1. Bilateral lower extremity edema   2. Palpitations   3. Elevated BP without diagnosis of hypertension   4. Mixed hyperlipidemia      Plan:   In order of problems listed above:  1. Bilateral Lower Extremity Edema - She was treated for cellulitis last month and reports her lower extremity edema was more significant  at that time. She does still have residual 1+ pitting edema along her left lower extremity. She has an Rx for Lasix 20 mg by her PCP but has not utilized this. I recommended that she take Lasix 20 mg for the next 3 days then as needed for edema. Will request a copy of most recent labs from her PCP. - She has been consuming fast food on a regular basis and I recommended limiting her sodium intake to less than 2000 mg daily. She was encouraged to continue to use compression stockings. If swelling has not improved by follow-up, would consider a repeat echocardiogram at that time.  2. Palpitations - She had PVC's by her prior monitor and denies any recent palpitations. Continue with conservative therapy at this time as she has not been interested in beta-blocker therapy in the past and this is not indicated currently given her asymptomatic state.  3. Elevated BP - BP was initially elevated at 150/88, rechecked after the patient was in the exam room for several minutes and improved to 138/82. Will continue to follow as she does not have a diagnosis of hypertension. I suspect her high sodium diet has been contributing to some elevated readings.  4. HLD - This has been followed by her PCP and she remains on Atorvastatin 10 mg daily. Will request a copy of most recent records.   At the patient's request, I did call her son Marilyn Haas) after our visit today to update him on the plan of care. He was appreciative  of the call and will encourage his mom to limit her sodium intake.     Medication Adjustments/Labs and Tests Ordered: Current medicines are reviewed at length with the patient today.  Concerns regarding medicines are outlined above.  Medication changes, Labs and Tests ordered today are listed in the Patient Instructions below. Patient Instructions   Medication Instructions:   Take your Lasix 20mg  for the next THREE DAYS then as needed for leg swelling.    Labwork:  None  Testing/Procedures:  None  Follow-Up:  2-3 months with Marilyn Haas, Marilyn Haas or Dr. Domenic Polite  Any Other Special Instructions Will Be Listed Below (If Applicable).   If you need a refill on your cardiac medications before your next appointment, please call your pharmacy.   Signed, Erma Heritage, Marilyn Haas  08/25/2019 4:48 PM    [facility] [address] [place], [place] [address] Phone: [phone] Fax: [phone]

## 2019-08-25 ENCOUNTER — Ambulatory Visit (INDEPENDENT_AMBULATORY_CARE_PROVIDER_SITE_OTHER): Payer: Medicare Other | Admitting: Student

## 2019-08-25 ENCOUNTER — Other Ambulatory Visit: Payer: Self-pay

## 2019-08-25 ENCOUNTER — Encounter: Payer: Self-pay | Admitting: Student

## 2019-08-25 VITALS — BP 138/82 | HR 92 | Ht 65.0 in | Wt 138.0 lb

## 2019-08-25 DIAGNOSIS — R600 Localized edema: Secondary | ICD-10-CM

## 2019-08-25 DIAGNOSIS — E782 Mixed hyperlipidemia: Secondary | ICD-10-CM | POA: Diagnosis not present

## 2019-08-25 DIAGNOSIS — R030 Elevated blood-pressure reading, without diagnosis of hypertension: Secondary | ICD-10-CM

## 2019-08-25 DIAGNOSIS — R002 Palpitations: Secondary | ICD-10-CM

## 2019-08-25 MED ORDER — FUROSEMIDE 20 MG PO TABS: 20.0000 mg | ORAL_TABLET | Freq: Every day | ORAL | 3 refills | Status: DC

## 2019-08-25 NOTE — Patient Instructions (Addendum)
Medication Instructions:   Take your Lasix 20mg  for the next THREE DAYS then as needed for leg swelling.   Labwork:  None  Testing/Procedures:  None  Follow-Up:  2-3 months with Bernerd Pho, PA-C or Dr. Domenic Polite  Any Other Special Instructions Will Be Listed Below (If Applicable).     If you need a refill on your cardiac medications before your next appointment, please call your pharmacy.        Two Gram Sodium Diet 2000 mg  What is Sodium? Sodium is a mineral found naturally in many foods. The most significant source of sodium in the diet is table salt, which is about 40% sodium.  Processed, convenience, and preserved foods also contain a large amount of sodium.  The body needs only 500 mg of sodium daily to function,  A normal diet provides more than enough sodium even if you do not use salt.  Why Limit Sodium? A build up of sodium in the body can cause thirst, increased blood pressure, shortness of breath, and water retention.  Decreasing sodium in the diet can reduce edema and risk of heart attack or stroke associated with high blood pressure.  Keep in mind that there are many other factors involved in these health problems.  Heredity, obesity, lack of exercise, cigarette smoking, stress and what you eat all play a role.  General Guidelines:  Do not add salt at the table or in cooking.  One teaspoon of salt contains over 2 grams of sodium.  Read food labels  Avoid processed and convenience foods  Ask your dietitian before eating any foods not dicussed in the menu planning guidelines  Consult your physician if you wish to use a salt substitute or a sodium containing medication such as antacids.  Limit milk and milk products to 16 oz (2 cups) per day.  Shopping Hints:  READ LABELS!! "Dietetic" does not necessarily mean low sodium.  Salt and other sodium ingredients are often added to foods during processing.   Menu Planning Guidelines Food Group Choose  More Often Avoid  Beverages (see also the milk group All fruit juices, low-sodium, salt-free vegetables juices, low-sodium carbonated beverages Regular vegetable or tomato juices, commercially softened water used for drinking or cooking  Breads and Cereals Enriched white, wheat, rye and pumpernickel bread, hard rolls and dinner rolls; muffins, cornbread and waffles; most dry cereals, cooked cereal without added salt; unsalted crackers and breadsticks; low sodium or homemade bread crumbs Bread, rolls and crackers with salted tops; quick breads; instant hot cereals; pancakes; commercial bread stuffing; self-rising flower and biscuit mixes; regular bread crumbs or cracker crumbs  Desserts and Sweets Desserts and sweets mad with mild should be within allowance Instant pudding mixes and cake mixes  Fats Butter or margarine; vegetable oils; unsalted salad dressings, regular salad dressings limited to 1 Tbs; light, sour and heavy cream Regular salad dressings containing bacon fat, bacon bits, and salt pork; snack dips made with instant soup mixes or processed cheese; salted nuts  Fruits Most fresh, frozen and canned fruits Fruits processed with salt or sodium-containing ingredient (some dried fruits are processed with sodium sulfites        Vegetables Fresh, frozen vegetables and low- sodium canned vegetables Regular canned vegetables, sauerkraut, pickled vegetables, and others prepared in brine; frozen vegetables in sauces; vegetables seasoned with ham, bacon or salt pork  Condiments, Sauces, Miscellaneous  Salt substitute with physician's approval; pepper, herbs, spices; vinegar, lemon or lime juice; hot pepper sauce; garlic powder, onion  powder, low sodium soy sauce (1 Tbs.); low sodium condiments (ketchup, chili sauce, mustard) in limited amounts (1 tsp.) fresh ground horseradish; unsalted tortilla chips, pretzels, potato chips, popcorn, salsa (1/4 cup) Any seasoning made with salt including garlic salt,  celery salt, onion salt, and seasoned salt; sea salt, rock salt, kosher salt; meat tenderizers; monosodium glutamate; mustard, regular soy sauce, barbecue, sauce, chili sauce, teriyaki sauce, steak sauce, Worcestershire sauce, and most flavored vinegars; canned gravy and mixes; regular condiments; salted snack foods, olives, picles, relish, horseradish sauce, catsup   Food preparation: Try these seasonings Meats:    Pork Sage, onion Serve with applesauce  Chicken Poultry seasoning, thyme, parsley Serve with cranberry sauce  Lamb Curry powder, rosemary, garlic, thyme Serve with mint sauce or jelly  Veal Marjoram, basil Serve with current jelly, cranberry sauce  Beef Pepper, bay leaf Serve with dry mustard, unsalted chive butter  Fish [place] leaf, dill Serve with unsalted lemon butter, unsalted parsley butter  Vegetables:    Asparagus Lemon juice   Broccoli Lemon juice   Carrots Mustard dressing parsley, mint, nutmeg, glazed with unsalted butter and sugar   Green beans Marjoram, lemon juice, nutmeg,dill seed   Tomatoes Basil, marjoram, onion   Spice /blend for [facility] [facility]" 4 tsp ground thyme 1 tsp ground sage 3 tsp ground rosemary 4 tsp ground marjoram

## 2019-08-29 ENCOUNTER — Ambulatory Visit: Payer: Medicare Other | Admitting: Student

## 2019-10-25 ENCOUNTER — Other Ambulatory Visit: Payer: Self-pay

## 2019-10-25 ENCOUNTER — Encounter: Payer: Self-pay | Admitting: Student

## 2019-10-25 ENCOUNTER — Ambulatory Visit (INDEPENDENT_AMBULATORY_CARE_PROVIDER_SITE_OTHER): Payer: Medicare Other | Admitting: Student

## 2019-10-25 VITALS — BP 148/72 | HR 96 | Ht 65.0 in | Wt 139.0 lb

## 2019-10-25 DIAGNOSIS — R600 Localized edema: Secondary | ICD-10-CM

## 2019-10-25 DIAGNOSIS — E782 Mixed hyperlipidemia: Secondary | ICD-10-CM | POA: Diagnosis not present

## 2019-10-25 DIAGNOSIS — R030 Elevated blood-pressure reading, without diagnosis of hypertension: Secondary | ICD-10-CM

## 2019-10-25 DIAGNOSIS — R002 Palpitations: Secondary | ICD-10-CM

## 2019-10-25 NOTE — Progress Notes (Signed)
Cardiology Office Note    Date:  10/25/2019   ID:  Teale, Goodgame 02-20-1941, MRN [id number]  PCP:  Redmond School, MD  Cardiologist: Rozann Lesches, MD    Chief Complaint  Patient presents with  . Follow-up    2 month visit    History of Present Illness:    Marilyn Haas is a 79 y.o. female with past medical history of lower extremity edema, HLD, palpitations (PVC's by prior monitor) and breast cancer (s/p lumpectomy) who presents to the office today for 73-month follow-up.   She was last examined by myself in 08/2019 and reported worsening lower extremity edema over the past few months. Had also been treated for cellulitis by her PCP and at Urgent Care. She did have 1+ pitting edema on examination and she had an Rx for Lasix 20mg  PRN but had not utilized this. Was encouraged to take Lasix 20mg  for the next 3 days then as needed for weight gain or edema. She was consuming a high-sodium diet and was encouraged to reduce her sodium intake.   In talking with the patient today, she reports overall doing well until today when she learned that her sister who lives nearby at an ALF is relocating to [place], [place] to be closer to her daughter. She visits her multiple times during the week and is fearful she will be lonely with her not here. Her blood pressure was initially elevated at 158/96 during today's visit and was improved to 148/72. She feels like the stress of this news is likely causing her elevated readings.  She does report her lower extremity edema resolved after taking Lasix for several days following her last visit and she has not had to utilize Lasix in several weeks. She denies any recent chest pain or palpitations. No recent dyspnea on exertion, orthopnea, PND or lower extremity edema.  Past Medical History:  Diagnosis Date  . Anemia   . Anxiety   . Arthritis   . Breast cancer ([facility]) 05/19/2016   Right breast  . Childhood asthma   . Chronic back pain     Scoliosis, stenosis  . Depression   . Diverticulosis   . GERD (gastroesophageal reflux disease)   . Hard of hearing   . Headache   . History of blood transfusion   . History of colon polyps   . History of hiatal hernia   . History of pneumonia   . Insomnia   . Osteopenia   . Osteoporosis   . Personal history of radiation therapy   . Thickened endometrium 09/29/2017    Past Surgical History:  Procedure Laterality Date  . BREAST LUMPECTOMY WITH RADIOACTIVE SEED AND SENTINEL LYMPH NODE BIOPSY Right 07/31/2016   Procedure: BREAST LUMPECTOMY WITH RADIOACTIVE SEED AND SENTINEL LYMPH NODE BIOPSY, INJECT BLUE DYE RIGHT BREAST;  Surgeon: Fanny Skates, MD;  Location: [facility];  Service: General;  Laterality: Right;  . COLONOSCOPY    . 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 ESOPHAGEAL DILATION N/A 09/24/2017   Procedure: ESOPHAGEAL DILATION;  Surgeon: Rogene Houston, MD;  [facility];  Service: Endoscopy;  Laterality: N/A;  . ESOPHAGOGASTRODUODENOSCOPY N/A 01/26/2014   Procedure: ESOPHAGOGASTRODUODENOSCOPY (EGD);  Surgeon: Rogene Houston, MD;  [facility];  Service: Endoscopy;  Laterality: N/A;  1030  . ESOPHAGOGASTRODUODENOSCOPY (EGD) WITH PROPOFOL  N/A 09/24/2017   Procedure: ESOPHAGOGASTRODUODENOSCOPY (EGD) WITH PROPOFOL;  Surgeon: Rogene Houston, MD;  [facility];  Service: Endoscopy;  Laterality: N/A;  11:15  . HIP FRACTURE SURGERY     05/2009 left -[facility] Camel  . HYSTEROSCOPY WITH D & C  05/24/2018   Procedure: DILATATION AND CURETTAGE /HYSTEROSCOPY (PROCEDURE # 2)/PAP SMEAR (PROCEDURE #1);  Surgeon: Jonnie Kind, MD;  Location: [facility];  Service: Gynecology;;  . Venia Minks DILATION N/A 01/26/2014   Procedure: Keturah Shavers;  Surgeon: Rogene Houston, MD;  [facility];  Service: Endoscopy;   Laterality: N/A;    Current Medications: Outpatient Medications Prior to Visit  Medication Sig Dispense Refill  . ALPRAZolam (XANAX) 0.25 MG tablet Take 0.25 mg by mouth at bedtime.    . ARIPiprazole (ABILIFY) 2 MG tablet Take 2 mg by mouth daily.    Marland Kitchen atorvastatin (LIPITOR) 10 MG tablet Take 10 mg by mouth daily.    . cyanocobalamin (,VITAMIN B-12,) 1000 MCG/ML injection Inject 1,000 mcg into the muscle every 30 (thirty) days.    Marland Kitchen docusate sodium (COLACE) 100 MG capsule Take 2 capsules (200 mg total) by mouth daily. (Patient taking differently: Take 200-300 mg by mouth daily. ) 10 capsule 0  . DULoxetine (CYMBALTA) 60 MG capsule Take 120 mg by mouth daily.    . famotidine (PEPCID) 20 MG tablet TAKE 1 TABLET (20 MG TOTAL) BY MOUTH TWICE A DAY 60 tablet 11  . ferrous sulfate 325 (65 FE) MG tablet Take 325 mg by mouth daily with breakfast.    . furosemide (LASIX) 20 MG tablet Take 1 tablet (20 mg total) by mouth daily for 3 days. Then as needed for edema. 90 tablet 3  . gabapentin (NEURONTIN) 100 MG capsule Take 1 capsule (100 mg total) by mouth at bedtime. 30 capsule 3  . HYDROcodone-acetaminophen (NORCO) 10-325 MG tablet Take 1 tablet by mouth 3 (three) times daily as needed for moderate pain.     Marland Kitchen metoCLOPramide (REGLAN) 10 MG tablet Take 1 tablet (10 mg total) by mouth every 8 (eight) hours as needed for nausea (nausea/headache). 10 tablet 0  . mirtazapine (REMERON) 15 MG tablet Take 1 tablet (15 mg total) by mouth at bedtime. 90 tablet 0  . pantoprazole (PROTONIX) 40 MG tablet TAKE ONE (1) TABLET EACH DAY 30 tablet 3  . Polyethyl Glycol-Propyl Glycol (SYSTANE OP) Place 1 drop into both eyes daily as needed (for dry eyes).    . polyethylene glycol powder (GLYCOLAX/MIRALAX) powder Take 8.5 g by mouth daily. (Patient taking differently: Take 17 g by mouth daily as needed for moderate constipation. ) 255 g 0  . tamoxifen (NOLVADEX) 20 MG tablet Take 1 tablet (20 mg total) by mouth daily. 90  tablet 2  . zolpidem (AMBIEN) 5 MG tablet Take 5 mg by mouth at bedtime as needed.     No facility-administered medications prior to visit.     Allergies:   Avelox [moxifloxacin hcl in nacl]   Social History   Socioeconomic History  . Marital status: Married    Spouse name: Not on file  . Number of children: 3  . Years of education: Not on file  . Highest education level: Not on file  Occupational History    Employer: RETIRED  Tobacco Use  . Smoking status: Former Smoker    Packs/day: 0.50    Years: 2.00    Pack years: 1.00    Types: Cigarettes    Quit date:  01/15/1961    Years since quitting: 58.8  . Smokeless tobacco: Never Used  Vaping Use  . Vaping Use: Never used  Substance and Sexual Activity  . Alcohol use: No    Alcohol/week: 0.0 standard drinks  . Drug use: No  . Sexual activity: Not Currently    Birth control/protection: Post-menopausal  Other Topics Concern  . Not on file  Social History Narrative  . Not on file   Social Determinants of Health   Financial Resource Strain:   . Difficulty of Paying Living Expenses:   Food Insecurity:   . Worried About Charity fundraiser in the Last Year:   . Arboriculturist in the Last Year:   Transportation Needs:   . Film/video editor (Medical):   Marland Kitchen Lack of Transportation (Non-Medical):   Physical Activity:   . Days of Exercise per Week:   . Minutes of Exercise per Session:   Stress:   . Feeling of Stress :   Social Connections:   . Frequency of Communication with Friends and Family:   . Frequency of Social Gatherings with Friends and Family:   . Attends Religious Services:   . Active Member of Clubs or Organizations:   . Attends Archivist Meetings:   Marland Kitchen Marital Status:      Family History:  The patient's family history includes Cancer in her maternal aunt; Depression in her mother and sister; Diabetes in her mother, sister, and sister; Heart failure in her mother and sister; Hypertension in  her mother and sister; Other in her father.   Review of Systems:   Please see the history of present illness.     General:  No chills, fever, night sweats or weight changes.  Cardiovascular:  No chest pain, dyspnea on exertion, orthopnea, palpitations, paroxysmal nocturnal dyspnea. Positive for edema (resolved).  Dermatological: No rash, lesions/masses Respiratory: No cough, dyspnea Urologic: No hematuria, dysuria Abdominal:   No nausea, vomiting, diarrhea, bright red blood per rectum, melena, or hematemesis Neurologic:  No visual changes, wkns, changes in mental status. All other systems reviewed and are otherwise negative except as noted above.   Physical Exam:    VS:  BP (!) 148/72   Pulse 96   Ht 5\' 5"  (1.651 m)   Wt 139 lb (63 kg)   SpO2 98%   BMI 23.13 kg/m    General: Well developed, elderly female appearing in no acute distress. Head: Normocephalic, atraumatic. Neck: No carotid bruits. JVD not elevated.  Lungs: Respirations regular and unlabored, without wheezes or rales.  Heart: Regular rate and rhythm. No S3 or S4.  No murmur, no rubs, or gallops appreciated. Abdomen: Appears non-distended. No obvious abdominal masses. Msk:  Strength and tone appear normal for age. No obvious joint deformities or effusions. Extremities: No clubbing or cyanosis. No lower extremity edema.  Distal pedal pulses are 2+ bilaterally. Neuro: Alert and oriented X 3. Moves all extremities spontaneously. No focal deficits noted. Psych:  Responds to questions appropriately with a normal affect. Skin: No rashes or lesions noted  Wt Readings from Last 3 Encounters:  10/25/19 139 lb (63 kg)  08/25/19 138 lb (62.6 kg)  07/22/19 120 lb (54.4 kg)     Studies/Labs Reviewed:   EKG:  EKG is not ordered today.    Recent Labs: 12/05/2018: ALT 17; BUN 14; Creatinine, Ser 0.70; Hemoglobin 11.4; Platelets 219; Potassium 3.8; Sodium 130   Lipid Panel    Component Value Date/Time   CHOL  209 (H)  12/13/2014 1132   CHOL 150 02/17/2013 0934   TRIG 95 12/13/2014 1132   TRIG 111 02/17/2013 0934   HDL 114 12/13/2014 1132   HDL 40 02/17/2013 0934   CHOLHDL 1.8 12/13/2014 1132   VLDL 19 12/13/2014 1132   LDLCALC 76 12/13/2014 1132   LDLCALC 88 02/17/2013 0934    Additional studies/ records that were reviewed today include:   Echocardiogram: 02/2016 Study Conclusions   - Left ventricle: The cavity size was normal. Wall thickness was  increased in a pattern of mild LVH. Systolic function was normal.  The estimated ejection fraction was in the range of 55% to 60%.  Wall motion was normal; there were no regional wall motion  abnormalities. Doppler parameters are consistent with abnormal  left ventricular relaxation (grade 1 diastolic dysfunction).  - Aortic valve: Mildly calcified annulus. Trileaflet. There was  mild regurgitation.  - Mitral valve: There was mild regurgitation.  - Right atrium: Central venous pressure (est): 3 mm Hg.  - Atrial septum: No defect or patent foramen ovale was identified.  - Tricuspid valve: There was mild regurgitation.  - Pulmonary arteries: PA peak pressure: 22 mm Hg (S).   Impressions:   - Mild LVH with LVEF 55-60% and grade 1 diastolic dysfunction. Mild  mitral regurgitation. Mildly calcified aortic annulus with mild  aortic regurgitation. Mild tricuspid regurgitation with normal  estimated PASP 22 mmHg.   Assessment:    1. Bilateral lower extremity edema   2. Palpitations   3. Elevated BP without diagnosis of hypertension   4. Mixed hyperlipidemia      Plan:   In order of problems listed above:  1. Bilateral Lower Extremity Edema - Her symptoms have improved since her last visit and she has not utilized her as needed Lasix in several weeks. She continues to eat out at restaurants for most meals or get takeout and I reviewed again the importance of limiting her sodium intake. Continue Lasix 20 mg as needed for edema  or weight gain.  2.  Palpitations - She had PVC's by prior monitoring but denies any recent symptoms. She has preferred conservative management by review of notes and is not interested in AV nodal blocking agents at this time.  3. Elevated BP - Her blood pressure was initially elevated at 158/96, rechecked and still at 148/72. She does report being under increased stress today as outlined above. She has a blood pressure cuff at home and she was provided with a BP log today. I encouraged her to track this at home and report back if BP remains above goal.  4.  HLD - This has been followed by her PCP and she remains on Atorvastatin 10 mg daily.   Medication Adjustments/Labs and Tests Ordered: Current medicines are reviewed at length with the patient today.  Concerns regarding medicines are outlined above.  Medication changes, Labs and Tests ordered today are listed in the Patient Instructions below. Patient Instructions  Medication Instructions:  Your physician recommends that you continue on your current medications as directed. Please refer to the Current Medication list given to you today.  *If you need a refill on your cardiac medications before your next appointment, please call your pharmacy*   Lab Work: NONE   If you have labs (blood work) drawn today and your tests are completely normal, you will receive your results only by: Marland Kitchen MyChart Message (if you have MyChart) OR . A paper copy in the mail If you have  any lab test that is abnormal or we need to change your treatment, we will call you to review the results.   Testing/Procedures: Your physician has requested that you regularly monitor and record your blood pressure readings at home. Please use the same machine at the same time of day to check your readings and record them to bring to your follow-up visit.  Follow-Up: At [facility], you and your health needs are our priority.  As part of our continuing mission to provide  you with exceptional heart care, we have created designated Provider Care Teams.  These Care Teams include your primary Cardiologist (physician) and Advanced Practice Providers (APPs -  Physician Assistants and Nurse Practitioners) who all work together to provide you with the care you need, when you need it.  We recommend signing up for the patient portal called "MyChart".  Sign up information is provided on this After Visit Summary.  MyChart is used to connect with patients for Virtual Visits (Telemedicine).  Patients are able to view lab/test results, encounter notes, upcoming appointments, etc.  Non-urgent messages can be sent to your provider as well.   To learn more about what you can do with MyChart, go to [email or web address].    Your next appointment:   6 month(s)  The format for your next appointment:   In Person  Provider:   Dr. Domenic Polite    Other Instructions Thank you for choosing [facility]!     Signed, Erma Heritage, PA-C  10/25/2019 5:30 PM    [facility] [address] [place], [place] [address] Phone: [phone] Fax: [phone]

## 2019-10-25 NOTE — Patient Instructions (Signed)
Medication Instructions:  Your physician recommends that you continue on your current medications as directed. Please refer to the Current Medication list given to you today.  *If you need a refill on your cardiac medications before your next appointment, please call your pharmacy*   Lab Work: NONE   If you have labs (blood work) drawn today and your tests are completely normal, you will receive your results only by: Marland Kitchen MyChart Message (if you have MyChart) OR . A paper copy in the mail If you have any lab test that is abnormal or we need to change your treatment, we will call you to review the results.   Testing/Procedures: Your physician has requested that you regularly monitor and record your blood pressure readings at home. Please use the same machine at the same time of day to check your readings and record them to bring to your follow-up visit.  Follow-Up: At [facility], you and your health needs are our priority.  As part of our continuing mission to provide you with exceptional heart care, we have created designated Provider Care Teams.  These Care Teams include your primary Cardiologist (physician) and Advanced Practice Providers (APPs -  Physician Assistants and Nurse Practitioners) who all work together to provide you with the care you need, when you need it.  We recommend signing up for the patient portal called "MyChart".  Sign up information is provided on this After Visit Summary.  MyChart is used to connect with patients for Virtual Visits (Telemedicine).  Patients are able to view lab/test results, encounter notes, upcoming appointments, etc.  Non-urgent messages can be sent to your provider as well.   To learn more about what you can do with MyChart, go to [email or web address].    Your next appointment:   6 month(s)  The format for your next appointment:   In Person  Provider:   Dr. Domenic Polite    Other Instructions Thank you for choosing [facility]!

## 2019-11-09 DIAGNOSIS — G894 Chronic pain syndrome: Secondary | ICD-10-CM | POA: Diagnosis not present

## 2019-11-09 DIAGNOSIS — F329 Major depressive disorder, single episode, unspecified: Secondary | ICD-10-CM | POA: Diagnosis not present

## 2019-11-09 DIAGNOSIS — F5101 Primary insomnia: Secondary | ICD-10-CM | POA: Diagnosis not present

## 2019-11-09 DIAGNOSIS — Z6824 Body mass index (BMI) 24.0-24.9, adult: Secondary | ICD-10-CM | POA: Diagnosis not present

## 2019-12-06 ENCOUNTER — Ambulatory Visit: Payer: Medicare Other | Admitting: Cardiology

## 2019-12-11 DIAGNOSIS — G894 Chronic pain syndrome: Secondary | ICD-10-CM | POA: Diagnosis not present

## 2019-12-11 DIAGNOSIS — T8484XA Pain due to internal orthopedic prosthetic devices, implants and grafts, initial encounter: Secondary | ICD-10-CM | POA: Diagnosis not present

## 2020-01-04 DIAGNOSIS — Z1331 Encounter for screening for depression: Secondary | ICD-10-CM | POA: Diagnosis not present

## 2020-01-04 DIAGNOSIS — G894 Chronic pain syndrome: Secondary | ICD-10-CM | POA: Diagnosis not present

## 2020-01-04 DIAGNOSIS — Z0001 Encounter for general adult medical examination with abnormal findings: Secondary | ICD-10-CM | POA: Diagnosis not present

## 2020-01-04 DIAGNOSIS — I10 Essential (primary) hypertension: Secondary | ICD-10-CM | POA: Diagnosis not present

## 2020-01-04 DIAGNOSIS — Z6824 Body mass index (BMI) 24.0-24.9, adult: Secondary | ICD-10-CM | POA: Diagnosis not present

## 2020-01-05 ENCOUNTER — Other Ambulatory Visit: Payer: Self-pay

## 2020-01-05 ENCOUNTER — Ambulatory Visit
Admission: EM | Admit: 2020-01-05 | Discharge: 2020-01-05 | Disposition: A | Discharge status: Home / Self Care | Payer: Medicare Other | Attending: Emergency Medicine | Admitting: Emergency Medicine

## 2020-01-05 DIAGNOSIS — J029 Acute pharyngitis, unspecified: Secondary | ICD-10-CM

## 2020-01-05 MED ORDER — SUCRALFATE 1 G PO TABS: 1.0000 g | ORAL_TABLET | Freq: Four times a day (QID) | ORAL | 0 refills | Status: DC

## 2020-01-05 NOTE — Discharge Instructions (Addendum)
Sucralfate was prescribed Continue to use throat lozenges to soothe throat Drink warm or cool liquids, use throat lozenges, or popsicles to help alleviate symptoms Follow-up with GI Take OTC ibuprofen or tylenol as needed for pain Follow up with PCP if symptoms persists Return or go to ER if patient has any new or worsening symptoms such as fever, chills, nausea, vomiting, worsening sore throat, cough, abdominal pain, chest pain, changes in bowel or bladder habits, etc..Marland Kitchen

## 2020-01-05 NOTE — ED Provider Notes (Signed)
[place]   [id number] 01/05/20 Arrival Time: 2130  QM:VHQI THROAT  SUBJECTIVE: History from: patient.  Marilyn Haas is a 79 y.o. female with history of esophageal dilatation presented to the urgent care with a complaint of sore throat that started last night.  Reported she choked on a piece of cake last night and developed the symptoms thereafter.  Has tried OTC Vicks lozenges without relief.  Symptoms are made worse with swallowing, but tolerating liquids and own secretions without difficulty.  Report previous symptoms in the past that improved with esophageal dilation.  States she was unable to get in with GI as the office is closed. Denies fever, chills, fatigue, ear pain, sinus pain, rhinorrhea, nasal congestion, cough, SOB, wheezing, chest pain, nausea, rash, changes in bowel or bladder habits.     ROS: As per HPI.  All other pertinent ROS negative.     Past Medical History:  Diagnosis Date   Anemia    Anxiety    Arthritis    Breast cancer ([place]) 05/19/2016   Right breast   Childhood asthma    Chronic back pain    Scoliosis, stenosis   Depression    Diverticulosis    GERD (gastroesophageal reflux disease)    Hard of hearing    Headache    History of blood transfusion    History of colon polyps    History of hiatal hernia    History of pneumonia    Insomnia    Osteopenia    Osteoporosis    Personal history of radiation therapy    Thickened endometrium 09/29/2017   Past Surgical History:  Procedure Laterality Date   BREAST LUMPECTOMY WITH RADIOACTIVE SEED AND SENTINEL LYMPH NODE BIOPSY Right 07/31/2016   Procedure: BREAST LUMPECTOMY WITH RADIOACTIVE SEED AND SENTINEL LYMPH NODE BIOPSY, INJECT BLUE DYE RIGHT BREAST;  Surgeon: Fanny Skates, MD;  Location: [place];  Service: General;  Laterality: Right;   COLONOSCOPY     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 07/04/2014   Procedure: LEFT CONVERSION TO TOTAL HIP ARTHROPLASTY;  Surgeon: Gaynelle Arabian, MD;  [facility];  Service: Orthopedics;  Laterality: Left;   ESOPHAGEAL DILATION N/A 09/24/2017   Procedure: ESOPHAGEAL DILATION;  Surgeon: Rogene Houston, MD;  [facility];  Service: Endoscopy;  Laterality: N/A;   ESOPHAGOGASTRODUODENOSCOPY N/A 01/26/2014   Procedure: ESOPHAGOGASTRODUODENOSCOPY (EGD);  Surgeon: Rogene Houston, MD;  [facility];  Service: Endoscopy;  Laterality: N/A;  1030   ESOPHAGOGASTRODUODENOSCOPY (EGD) WITH PROPOFOL N/A 09/24/2017   Procedure: ESOPHAGOGASTRODUODENOSCOPY (EGD) WITH PROPOFOL;  Surgeon: Rogene Houston, MD;  [facility];  Service: Endoscopy;  Laterality: N/A;  11:15   HIP FRACTURE SURGERY     05/2009 left -[place]   HYSTEROSCOPY WITH D & C  05/24/2018   Procedure: DILATATION AND CURETTAGE /HYSTEROSCOPY (PROCEDURE # 2)/PAP SMEAR (PROCEDURE #1);  Surgeon: Jonnie Kind, MD;  [facility];  Service: Gynecology;;   Venia Minks DILATION N/A 01/26/2014   Procedure: Keturah Shavers;  Surgeon: Rogene Houston, MD;  [facility];  Service: Endoscopy;  Laterality: N/A;   Allergies  Allergen Reactions   Avelox [Moxifloxacin Hcl In Nacl] Other (See Comments)    Altered mental status   No current facility-administered medications on file prior to encounter.   Current Outpatient Medications on File Prior to Encounter  Medication Sig Dispense Refill   ALPRAZolam (XANAX) 0.25 MG tablet Take 0.25 mg by mouth at bedtime.  ARIPiprazole (ABILIFY) 2 MG tablet Take 2 mg by mouth daily.     atorvastatin (LIPITOR) 10 MG tablet Take 10 mg by mouth daily.     cyanocobalamin (,VITAMIN B-12,) 1000 MCG/ML injection Inject 1,000 mcg into the muscle every 30 (thirty) days.     docusate sodium (COLACE) 100 MG capsule Take 2 capsules (200 mg total) by mouth daily. (Patient taking differently: Take 200-300 mg by mouth daily. ) 10 capsule 0   DULoxetine (CYMBALTA) 60 MG capsule Take 120 mg by mouth daily.       famotidine (PEPCID) 20 MG tablet TAKE 1 TABLET (20 MG TOTAL) BY MOUTH TWICE A DAY 60 tablet 11   ferrous sulfate 325 (65 FE) MG tablet Take 325 mg by mouth daily with breakfast.     furosemide (LASIX) 20 MG tablet Take 1 tablet (20 mg total) by mouth daily for 3 days. Then as needed for edema. 90 tablet 3   gabapentin (NEURONTIN) 100 MG capsule Take 1 capsule (100 mg total) by mouth at bedtime. 30 capsule 3   HYDROcodone-acetaminophen (NORCO) 10-325 MG tablet Take 1 tablet by mouth 3 (three) times daily as needed for moderate pain.      metoCLOPramide (REGLAN) 10 MG tablet Take 1 tablet (10 mg total) by mouth every 8 (eight) hours as needed for nausea (nausea/headache). 10 tablet 0   mirtazapine (REMERON) 15 MG tablet Take 1 tablet (15 mg total) by mouth at bedtime. 90 tablet 0   pantoprazole (PROTONIX) 40 MG tablet TAKE ONE (1) TABLET EACH DAY 30 tablet 3   Polyethyl Glycol-Propyl Glycol (SYSTANE OP) Place 1 drop into both eyes daily as needed (for dry eyes).     polyethylene glycol powder (GLYCOLAX/MIRALAX) powder Take 8.5 g by mouth daily. (Patient taking differently: Take 17 g by mouth daily as needed for moderate constipation. ) 255 g 0   tamoxifen (NOLVADEX) 20 MG tablet Take 1 tablet (20 mg total) by mouth daily. 90 tablet 2   zolpidem (AMBIEN) 5 MG tablet Take 5 mg by mouth at bedtime as needed.     Social History   Socioeconomic History   Marital status: Married    Spouse name: Not on file   Number of children: 3   Years of education: Not on file   Highest education level: Not on file  Occupational History    Employer: RETIRED  Tobacco Use   Smoking status: Former Smoker    Packs/day: 0.50    Years: 2.00    Pack years: 1.00    Types: Cigarettes    Quit date: 01/15/1961    Years since quitting: 59.0   Smokeless tobacco: Never Used  Vaping Use   Vaping Use: Never used  Substance and Sexual Activity   Alcohol use: No    Alcohol/week: 0.0 standard drinks    Drug use: No   Sexual activity: Not Currently    Birth control/protection: Post-menopausal  Other Topics Concern   Not on file  Social History Narrative   Not on file   Social Determinants of Health   Financial Resource Strain:    Difficulty of Paying Living Expenses: Not on file  Food Insecurity:    Worried About Charity fundraiser in the Last Year: Not on file   [employer] of Food in the Last Year: Not on file  Transportation Needs:    Lack of Transportation (Medical): Not on file   Lack of Transportation (Non-Medical): Not on file  Physical Activity:  Days of Exercise per Week: Not on file   Minutes of Exercise per Session: Not on file  Stress:    Feeling of Stress : Not on file  Social Connections:    Frequency of Communication with Friends and Family: Not on file   Frequency of Social Gatherings with Friends and Family: Not on file   Attends Religious Services: Not on file   Active Member of Clubs or Organizations: Not on file   Attends Archivist Meetings: Not on file   Marital Status: Not on file  Intimate Partner Violence:    Fear of Current or Ex-Partner: Not on file   Emotionally Abused: Not on file   Physically Abused: Not on file   Sexually Abused: Not on file   Family History  Problem Relation Age of Onset   Heart failure Mother    Diabetes Mother    Hypertension Mother    Depression Mother    Other Father        stomach ulcers; abd aneursym   Hypertension Sister    Heart failure Sister    Diabetes Sister    Depression Sister    Diabetes Sister        prediabetes   Cancer Maternal Aunt        breast    OBJECTIVE:  Vitals:   01/05/20 1323  BP: (!) 159/99  Pulse: 94  Resp: 20  Temp: 98.3 F (36.8 C)  SpO2: 95%     General appearance: alert; appears fatigued, but nontoxic, speaking in full sentences and managing own secretions HEENT: NCAT; Ears: EACs clear, TMs pearly gray with visible cone of  light, without erythema; Eyes: PERRL, EOMI grossly; Nose: no obvious rhinorrhea; Throat: oropharynx clear, tonsils 1+ and non erythematous, uvula midline Neck: supple without LAD Lungs: CTA bilaterally without adventitious breath sounds; cough absent Heart: regular rate and rhythm.  Radial pulses 2+ symmetrical bilaterally Skin: warm and dry Psychological: alert and cooperative; normal mood and affect  LABS: No results found for this or any previous visit (from the past 24 hour(s)).   ASSESSMENT & PLAN:  1. Sore throat    Patient stable at discharge.  Will prescribe sucralfate to help with possible ulcer in her throat.  Advised to follow-up with GI.   Meds ordered this encounter  Medications   sucralfate (CARAFATE) 1 g tablet    Sig: Take 1 tablet (1 g total) by mouth 4 (four) times daily.    Dispense:  30 tablet    Refill:  0    Discharge instructions  Sucralfate was prescribed Continue to use throat lozenges to soothe throat Drink warm or cool liquids, use throat lozenges, or popsicles to help alleviate symptoms Follow-up with GI Take OTC ibuprofen or tylenol as needed for pain Follow up with PCP if symptoms persists Return or go to ER if patient has any new or worsening symptoms such as fever, chills, nausea, vomiting, worsening sore throat, cough, abdominal pain, chest pain, changes in bowel or bladder habits, etc...  Reviewed expectations re: course of current medical issues. Questions answered. Outlined signs and symptoms indicating need for more acute intervention. Patient verbalized understanding. After Visit Summary given.         Emerson Monte, [place] 01/05/20 1342

## 2020-01-05 NOTE — ED Triage Notes (Signed)
Pt states that she got choked on a piece of cake last night and has sore throat now, pt has had 2 previous esophogeal dilatations, pt unable to get in with GI because closed today

## 2020-01-11 ENCOUNTER — Other Ambulatory Visit: Payer: Self-pay

## 2020-01-11 ENCOUNTER — Encounter (INDEPENDENT_AMBULATORY_CARE_PROVIDER_SITE_OTHER): Payer: Self-pay | Admitting: Gastroenterology

## 2020-01-11 ENCOUNTER — Ambulatory Visit (INDEPENDENT_AMBULATORY_CARE_PROVIDER_SITE_OTHER): Payer: Medicare Other | Admitting: Gastroenterology

## 2020-01-11 VITALS — BP 157/91 | HR 108 | Temp 99.1°F | Ht 65.0 in | Wt 132.5 lb

## 2020-01-11 DIAGNOSIS — R1319 Other dysphagia: Secondary | ICD-10-CM | POA: Diagnosis not present

## 2020-01-11 MED ORDER — OMEPRAZOLE 40 MG PO CPDR: 40.0000 mg | DELAYED_RELEASE_CAPSULE | Freq: Two times a day (BID) | ORAL | 3 refills | Status: DC

## 2020-01-11 NOTE — Progress Notes (Signed)
Marilyn Haas, M.D. Gastroenterology & Hepatology [facility] [address] [place], [place] [address]  Primary Care Physician: Redmond School, MD [address] [place] [address]  I will communicate my assessment and recommendations to the referring MD via EMR. "Note: Occasional unusual wording and randomly placed punctuation marks may result from the use of speech recognition technology to transcribe this document"  Problems: 1. Dysphagia 2. H/o Schatzki's ring  History of Present Illness: Marilyn Haas is a 79 y.o. female with past medical history of breast cancer, anxiety, anemia, depression, GERD, dysphagia due to Schatzki's ring who presents for follow up of dysphagia.  The patient was last seen on 08/31/2017. 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  The patient was given a prescription for famotidine 20 mg twice daily.  Patient comes today with her son.  He is very concerned as she has not been able to swallow properly since the last time she was seen in clinic.  She did not follow-up in the clinic since then for unknown reasons.  The patient and the son stated she has been dysphagia to solids mostly, she is able to eat things like ice cream or pudding without any problem but more solid food cause significant dysphagia or choking episodes.  Occasionally the patient has had to induce vomiting episodes when food gets stuck in her mid chest.  She states that her dysphagia has been getting worse recently and has lost close to 8 pounds in the last 2 weeks.  Denies any odynophagia or heartburn.  The patient denies having any nausea, vomiting, fever, chills, hematochezia, melena, hematemesis, abdominal distention, abdominal pain, diarrhea, jaundice, pruritus or weight loss.  Past Medical  History: Past Medical History:  Diagnosis Date  . Anemia   . Anxiety   . Arthritis   . Breast cancer ([place]) 05/19/2016   Right breast  . Childhood asthma   . Chronic back pain    Scoliosis, stenosis  . Depression   . Diverticulosis   . GERD (gastroesophageal reflux disease)   . Hard of hearing   . Headache   . History of blood transfusion   . History of colon polyps   . History of hiatal hernia   . History of pneumonia   . Insomnia   . Osteopenia   . Osteoporosis   . Personal history of radiation therapy   . Thickened endometrium 09/29/2017    Past Surgical History: Past Surgical History:  Procedure Laterality Date  . BREAST LUMPECTOMY WITH RADIOACTIVE SEED AND SENTINEL LYMPH NODE BIOPSY Right 07/31/2016   Procedure: BREAST LUMPECTOMY WITH RADIOACTIVE SEED AND SENTINEL LYMPH NODE BIOPSY, INJECT BLUE DYE RIGHT BREAST;  Surgeon: Fanny Skates, MD;  Location: [place];  Service: General;  Laterality: Right;  . COLONOSCOPY    . 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 ESOPHAGEAL DILATION N/A 09/24/2017   Procedure: ESOPHAGEAL DILATION;  Surgeon: Rogene Houston, MD;  [facility];  Service: Endoscopy;  Laterality: N/A;  . ESOPHAGOGASTRODUODENOSCOPY N/A 01/26/2014   Procedure: ESOPHAGOGASTRODUODENOSCOPY (EGD);  Surgeon: Rogene Houston, MD;  [facility];  Service: Endoscopy;  Laterality: N/A;  1030  .  ESOPHAGOGASTRODUODENOSCOPY (EGD) WITH PROPOFOL N/A 09/24/2017   Procedure: ESOPHAGOGASTRODUODENOSCOPY (EGD) WITH PROPOFOL;  Surgeon: Rogene Houston, MD;  [facility];  Service: Endoscopy;  Laterality: N/A;  11:15  . HIP FRACTURE SURGERY     05/2009 left -[facility]  . HYSTEROSCOPY WITH D & C  05/24/2018   Procedure: DILATATION AND CURETTAGE /HYSTEROSCOPY (PROCEDURE # 2)/PAP SMEAR (PROCEDURE #1);  Surgeon:  Jonnie Kind, MD;  [facility];  Service: Gynecology;;  . Venia Minks DILATION N/A 01/26/2014   Procedure: Keturah Shavers;  Surgeon: Rogene Houston, MD;  [facility];  Service: Endoscopy;  Laterality: N/A;    Family History: Family History  Problem Relation Age of Onset  . Heart failure Mother   . Diabetes Mother   . Hypertension Mother   . Depression Mother   . Other Father        stomach ulcers; abd aneursym  . Hypertension Sister   . Heart failure Sister   . Diabetes Sister   . Depression Sister   . Diabetes Sister        prediabetes  . Cancer Maternal Aunt        breast    Social History: Social History   Tobacco Use  Smoking Status Former Smoker  . Packs/day: 0.50  . Years: 2.00  . Pack years: 1.00  . Types: Cigarettes  . Quit date: 01/15/1961  . Years since quitting: 59.0  Smokeless Tobacco Never Used   Social History   Substance and Sexual Activity  Alcohol Use Yes  . Alcohol/week: 0.0 standard drinks   Comment: occasionally   Social History   Substance and Sexual Activity  Drug Use No    Allergies: Allergies  Allergen Reactions  . Avelox [Moxifloxacin Hcl In Nacl] Other (See Comments)    Altered mental status    Medications: Current Outpatient Medications  Medication Sig Dispense Refill  . ALPRAZolam (XANAX) 0.25 MG tablet Take 0.25 mg by mouth at bedtime.    . ARIPiprazole (ABILIFY) 2 MG tablet Take 2 mg by mouth daily.    Marland Kitchen docusate sodium (COLACE) 100 MG capsule Take 2 capsules (200 mg total) by mouth daily. (Patient taking differently: Take 200-300 mg by mouth daily. ) 10 capsule 0  . DULoxetine (CYMBALTA) 60 MG capsule Take 120 mg by mouth daily.    Marland Kitchen HYDROcodone-acetaminophen (NORCO) 10-325 MG tablet Take 1 tablet by mouth 3 (three) times daily as needed for moderate pain.     Vladimir Faster Glycol-Propyl Glycol (SYSTANE OP) Place 1 drop into both eyes daily as needed (for dry eyes).    . polyethylene glycol powder  (GLYCOLAX/MIRALAX) powder Take 8.5 g by mouth daily. (Patient taking differently: Take 17 g by mouth daily as needed for moderate constipation. ) 255 g 0  . tamoxifen (NOLVADEX) 20 MG tablet Take 1 tablet (20 mg total) by mouth daily. 90 tablet 2  . zolpidem (AMBIEN) 5 MG tablet Take 5 mg by mouth at bedtime as needed.    . cyanocobalamin (,VITAMIN B-12,) 1000 MCG/ML injection Inject 1,000 mcg into the muscle every 30 (thirty) days. (Patient not taking: Reported on 01/11/2020)    . famotidine (PEPCID) 20 MG tablet TAKE 1 TABLET (20 MG TOTAL) BY MOUTH TWICE A DAY (Patient not taking: Reported on 01/11/2020) 60 tablet 11  . ferrous sulfate 325 (65 FE) MG tablet Take 325 mg by mouth daily with breakfast. (Patient not taking: Reported on 01/11/2020)    . furosemide (LASIX) 20 MG  tablet Take 1 tablet (20 mg total) by mouth daily for 3 days. Then as needed for edema. 90 tablet 3  . gabapentin (NEURONTIN) 100 MG capsule Take 1 capsule (100 mg total) by mouth at bedtime. (Patient not taking: Reported on 01/11/2020) 30 capsule 3  . metoCLOPramide (REGLAN) 10 MG tablet Take 1 tablet (10 mg total) by mouth every 8 (eight) hours as needed for nausea (nausea/headache). (Patient not taking: Reported on 01/11/2020) 10 tablet 0  . mirtazapine (REMERON) 15 MG tablet Take 1 tablet (15 mg total) by mouth at bedtime. (Patient not taking: Reported on 01/11/2020) 90 tablet 0  . pantoprazole (PROTONIX) 40 MG tablet TAKE ONE (1) TABLET EACH DAY (Patient not taking: Reported on 01/11/2020) 30 tablet 3  . sucralfate (CARAFATE) 1 g tablet Take 1 tablet (1 g total) by mouth 4 (four) times daily. (Patient not taking: Reported on 01/11/2020) 30 tablet 0   No current facility-administered medications for this visit.    Review of Systems: GENERAL: negative for malaise, night sweats HEENT: No changes in hearing or vision, no nose bleeds or other nasal problems. NECK: Negative for lumps, goiter, pain and significant neck  swelling RESPIRATORY: Negative for cough, wheezing CARDIOVASCULAR: Negative for chest pain, leg swelling, palpitations, orthopnea GI: SEE HPI MUSCULOSKELETAL: Negative for joint pain or swelling, back pain, and muscle pain. SKIN: Negative for lesions, rash PSYCH: Negative for sleep disturbance, mood disorder and recent psychosocial stressors. HEMATOLOGY Negative for prolonged bleeding, bruising easily, and swollen nodes. ENDOCRINE: Negative for cold or heat intolerance, polyuria, polydipsia and goiter. NEURO: negative for tremor, gait imbalance, syncope and seizures. The remainder of the review of systems is noncontributory.   Physical Exam: BP (!) 157/91 (BP Location: Right Arm, Patient Position: Sitting, Cuff Size: Small)   Pulse (!) 108   Temp 99.1 F (37.3 C) (Oral)   Ht 5\' 5"  (1.651 m)   Wt 132 lb 8 oz (60.1 kg)   BMI 22.05 kg/m  GENERAL: The patient is AO x3, in no acute distress. HEENT: Head is normocephalic and atraumatic. EOMI are intact. Mouth is well hydrated and without lesions. NECK: Supple. No masses LUNGS: Clear to auscultation. No presence of rhonchi/wheezing/rales. Adequate chest expansion HEART: RRR, normal s1 and s2. ABDOMEN: Soft, nontender, no guarding, no peritoneal signs, and nondistended. BS +. No masses. EXTREMITIES: Without any cyanosis, clubbing, rash, lesions or edema. NEUROLOGIC: AOx3, no focal motor deficit. SKIN: no jaundice, no rashes  Imaging/Labs: as above  I personally reviewed and interpreted the available labs, imaging and endoscopic files.  Impression and Plan: [name] is a 79 y.o. female with past medical history of breast cancer, anxiety, anemia, depression, GERD, dysphagia due to Schatzki's ring who presents for follow up of dysphagia.  The patient has presented with progressive symptoms of dysphagia which have affected her lifestyle significantly.  It is likely that she has presented with recurrent Schatzki's ring as she has  not been taking any acid suppression therapy recently for unknown reasons.  At this time, I will start her on omeprazole 40 mg twice daily for three months, will need to take a PPI indefinitely, and will schedule her for an EGD with esophageal dilation.  Both the patient and the son understood and agreed.   All questions were answered.      Harvel Quale, MD Gastroenterology and Hepatology [facility]

## 2020-01-11 NOTE — H&P (View-Only) (Signed)
Maylon Peppers, M.D. Gastroenterology & Hepatology [facility] [address] [place], [place] [address]  Primary Care Physician: Redmond School, MD [address] [place] [address]  I will communicate my assessment and recommendations to the referring MD via EMR. "Note: Occasional unusual wording and randomly placed punctuation marks may result from the use of speech recognition technology to transcribe this document"  Problems: 1. Dysphagia 2. H/o Schatzki's ring  History of Present Illness: Marilyn Haas is a 79 y.o. female with past medical history of breast cancer, anxiety, anemia, depression, GERD, dysphagia due to Schatzki's ring who presents for follow up of dysphagia.  The patient was last seen on 08/31/2017. 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  The patient was given a prescription for famotidine 20 mg twice daily.  Patient comes today with her son.  He is very concerned as she has not been able to swallow properly since the last time she was seen in clinic.  She did not follow-up in the clinic since then for unknown reasons.  The patient and the son stated she has been dysphagia to solids mostly, she is able to eat things like ice cream or pudding without any problem but more solid food cause significant dysphagia or choking episodes.  Occasionally the patient has had to induce vomiting episodes when food gets stuck in her mid chest.  She states that her dysphagia has been getting worse recently and has lost close to 8 pounds in the last 2 weeks.  Denies any odynophagia or heartburn.  The patient denies having any nausea, vomiting, fever, chills, hematochezia, melena, hematemesis, abdominal distention, abdominal pain, diarrhea, jaundice, pruritus or weight loss.  Past Medical  History: Past Medical History:  Diagnosis Date  . Anemia   . Anxiety   . Arthritis   . Breast cancer ([place]) 05/19/2016   Right breast  . Childhood asthma   . Chronic back pain    Scoliosis, stenosis  . Depression   . Diverticulosis   . GERD (gastroesophageal reflux disease)   . Hard of hearing   . Headache   . History of blood transfusion   . History of colon polyps   . History of hiatal hernia   . History of pneumonia   . Insomnia   . Osteopenia   . Osteoporosis   . Personal history of radiation therapy   . Thickened endometrium 09/29/2017    Past Surgical History: Past Surgical History:  Procedure Laterality Date  . BREAST LUMPECTOMY WITH RADIOACTIVE SEED AND SENTINEL LYMPH NODE BIOPSY Right 07/31/2016   Procedure: BREAST LUMPECTOMY WITH RADIOACTIVE SEED AND SENTINEL LYMPH NODE BIOPSY, INJECT BLUE DYE RIGHT BREAST;  Surgeon: Fanny Skates, MD;  Location: [facility];  Service: General;  Laterality: Right;  . COLONOSCOPY    . 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 ESOPHAGEAL DILATION N/A 09/24/2017   Procedure: ESOPHAGEAL DILATION;  Surgeon: Rogene Houston, MD;  [facility];  Service: Endoscopy;  Laterality: N/A;  . ESOPHAGOGASTRODUODENOSCOPY N/A 01/26/2014   Procedure: ESOPHAGOGASTRODUODENOSCOPY (EGD);  Surgeon: Rogene Houston, MD;  [facility];  Service: Endoscopy;  Laterality: N/A;  1030  .  ESOPHAGOGASTRODUODENOSCOPY (EGD) WITH PROPOFOL N/A 09/24/2017   Procedure: ESOPHAGOGASTRODUODENOSCOPY (EGD) WITH PROPOFOL;  Surgeon: Rogene Houston, MD;  [facility];  Service: Endoscopy;  Laterality: N/A;  11:15  . HIP FRACTURE SURGERY     05/2009 left -M[facility] Camel  . HYSTEROSCOPY WITH D & C  05/24/2018   Procedure: DILATATION AND CURETTAGE /HYSTEROSCOPY (PROCEDURE # 2)/PAP SMEAR (PROCEDURE #1);  Surgeon:  Jonnie Kind, MD;  Location: [facility];  Service: Gynecology;;  . Venia Minks DILATION N/A 01/26/2014   Procedure: Keturah Shavers;  Surgeon: Rogene Houston, MD;  [facility];  Service: Endoscopy;  Laterality: N/A;    Family History: Family History  Problem Relation Age of Onset  . Heart failure Mother   . Diabetes Mother   . Hypertension Mother   . Depression Mother   . Other Father        stomach ulcers; abd aneursym  . Hypertension Sister   . Heart failure Sister   . Diabetes Sister   . Depression Sister   . Diabetes Sister        prediabetes  . Cancer Maternal Aunt        breast    Social History: Social History   Tobacco Use  Smoking Status Former Smoker  . Packs/day: 0.50  . Years: 2.00  . Pack years: 1.00  . Types: Cigarettes  . Quit date: 01/15/1961  . Years since quitting: 59.0  Smokeless Tobacco Never Used   Social History   Substance and Sexual Activity  Alcohol Use Yes  . Alcohol/week: 0.0 standard drinks   Comment: occasionally   Social History   Substance and Sexual Activity  Drug Use No    Allergies: Allergies  Allergen Reactions  . Avelox [Moxifloxacin Hcl In Nacl] Other (See Comments)    Altered mental status    Medications: Current Outpatient Medications  Medication Sig Dispense Refill  . ALPRAZolam (XANAX) 0.25 MG tablet Take 0.25 mg by mouth at bedtime.    . ARIPiprazole (ABILIFY) 2 MG tablet Take 2 mg by mouth daily.    Marland Kitchen docusate sodium (COLACE) 100 MG capsule Take 2 capsules (200 mg total) by mouth daily. (Patient taking differently: Take 200-300 mg by mouth daily. ) 10 capsule 0  . DULoxetine (CYMBALTA) 60 MG capsule Take 120 mg by mouth daily.    Marland Kitchen HYDROcodone-acetaminophen (NORCO) 10-325 MG tablet Take 1 tablet by mouth 3 (three) times daily as needed for moderate pain.     Vladimir Faster Glycol-Propyl Glycol (SYSTANE OP) Place 1 drop into both eyes daily as needed (for dry eyes).    . polyethylene glycol powder  (GLYCOLAX/MIRALAX) powder Take 8.5 g by mouth daily. (Patient taking differently: Take 17 g by mouth daily as needed for moderate constipation. ) 255 g 0  . tamoxifen (NOLVADEX) 20 MG tablet Take 1 tablet (20 mg total) by mouth daily. 90 tablet 2  . zolpidem (AMBIEN) 5 MG tablet Take 5 mg by mouth at bedtime as needed.    . cyanocobalamin (,VITAMIN B-12,) 1000 MCG/ML injection Inject 1,000 mcg into the muscle every 30 (thirty) days. (Patient not taking: Reported on 01/11/2020)    . famotidine (PEPCID) 20 MG tablet TAKE 1 TABLET (20 MG TOTAL) BY MOUTH TWICE A DAY (Patient not taking: Reported on 01/11/2020) 60 tablet 11  . ferrous sulfate 325 (65 FE) MG tablet Take 325 mg by mouth daily with breakfast. (Patient not taking: Reported on 01/11/2020)    . furosemide (LASIX) 20 MG  tablet Take 1 tablet (20 mg total) by mouth daily for 3 days. Then as needed for edema. 90 tablet 3  . gabapentin (NEURONTIN) 100 MG capsule Take 1 capsule (100 mg total) by mouth at bedtime. (Patient not taking: Reported on 01/11/2020) 30 capsule 3  . metoCLOPramide (REGLAN) 10 MG tablet Take 1 tablet (10 mg total) by mouth every 8 (eight) hours as needed for nausea (nausea/headache). (Patient not taking: Reported on 01/11/2020) 10 tablet 0  . mirtazapine (REMERON) 15 MG tablet Take 1 tablet (15 mg total) by mouth at bedtime. (Patient not taking: Reported on 01/11/2020) 90 tablet 0  . pantoprazole (PROTONIX) 40 MG tablet TAKE ONE (1) TABLET EACH DAY (Patient not taking: Reported on 01/11/2020) 30 tablet 3  . sucralfate (CARAFATE) 1 g tablet Take 1 tablet (1 g total) by mouth 4 (four) times daily. (Patient not taking: Reported on 01/11/2020) 30 tablet 0   No current facility-administered medications for this visit.    Review of Systems: GENERAL: negative for malaise, night sweats HEENT: No changes in hearing or vision, no nose bleeds or other nasal problems. NECK: Negative for lumps, goiter, pain and significant neck  swelling RESPIRATORY: Negative for cough, wheezing CARDIOVASCULAR: Negative for chest pain, leg swelling, palpitations, orthopnea GI: SEE HPI MUSCULOSKELETAL: Negative for joint pain or swelling, back pain, and muscle pain. SKIN: Negative for lesions, rash PSYCH: Negative for sleep disturbance, mood disorder and recent psychosocial stressors. HEMATOLOGY Negative for prolonged bleeding, bruising easily, and swollen nodes. ENDOCRINE: Negative for cold or heat intolerance, polyuria, polydipsia and goiter. NEURO: negative for tremor, gait imbalance, syncope and seizures. The remainder of the review of systems is noncontributory.   Physical Exam: BP (!) 157/91 (BP Location: Right Arm, Patient Position: Sitting, Cuff Size: Small)   Pulse (!) 108   Temp 99.1 F (37.3 C) (Oral)   Ht 5\' 5"  (1.651 m)   Wt 132 lb 8 oz (60.1 kg)   BMI 22.05 kg/m  GENERAL: The patient is AO x3, in no acute distress. HEENT: Head is normocephalic and atraumatic. EOMI are intact. Mouth is well hydrated and without lesions. NECK: Supple. No masses LUNGS: Clear to auscultation. No presence of rhonchi/wheezing/rales. Adequate chest expansion HEART: RRR, normal s1 and s2. ABDOMEN: Soft, nontender, no guarding, no peritoneal signs, and nondistended. BS +. No masses. EXTREMITIES: Without any cyanosis, clubbing, rash, lesions or edema. NEUROLOGIC: AOx3, no focal motor deficit. SKIN: no jaundice, no rashes  Imaging/Labs: as above  I personally reviewed and interpreted the available labs, imaging and endoscopic files.  Impression and Plan: [name] is a 79 y.o. female with past medical history of breast cancer, anxiety, anemia, depression, GERD, dysphagia due to Schatzki's ring who presents for follow up of dysphagia.  The patient has presented with progressive symptoms of dysphagia which have affected her lifestyle significantly.  It is likely that she has presented with recurrent Schatzki's ring as she has  not been taking any acid suppression therapy recently for unknown reasons.  At this time, I will start her on omeprazole 40 mg twice daily for three months, will need to take a PPI indefinitely, and will schedule her for an EGD with esophageal dilation.  Both the patient and the son understood and agreed.   All questions were answered.      Harvel Quale, MD Gastroenterology and Hepatology [facility]

## 2020-01-11 NOTE — Patient Instructions (Addendum)
Schedule EGD with ED Omeprazole 40 mg BID for 3 months, then daily

## 2020-01-12 ENCOUNTER — Other Ambulatory Visit (INDEPENDENT_AMBULATORY_CARE_PROVIDER_SITE_OTHER): Payer: Self-pay | Admitting: *Deleted

## 2020-01-16 ENCOUNTER — Other Ambulatory Visit (HOSPITAL_COMMUNITY): Payer: Self-pay | Admitting: Physician Assistant

## 2020-01-16 DIAGNOSIS — E2839 Other primary ovarian failure: Secondary | ICD-10-CM

## 2020-01-16 NOTE — Patient Instructions (Signed)
[name]  01/16/2020     @PREFPERIOPPHARMACY @   Your procedure is scheduled on  01/19/2020.  Report to [name] at  [email or web address].M.  Call this number if you have problems the morning of surgery:  [phone]   Remember:  Follow the diet instructions given to you by the office.                        Take these medicines the morning of surgery with A SIP OF WATER  Abilify, cymbalta, hydrocodone(if needed), prilosec, tamoxifen.    Do not wear jewelry, make-up or nail polish.  Do not wear lotions, powders, or perfumes. Please wear deodorant and brush your teeth.  Do not shave 48 hours prior to surgery.  Men may shave face and neck.  Do not bring valuables to the hospital.  [facility] is not responsible for any belongings or valuables.  Contacts, dentures or bridgework may not be worn into surgery.  Leave your suitcase in the car.  After surgery it may be brought to your room.  For patients admitted to the hospital, discharge time will be determined by your treatment team.  Patients discharged the day of surgery will not be allowed to drive home.   Name and phone number of your driver:   family Special instructions:  DO NOT smoke the morning of your procedure.  Please read over the following fact sheets that you were given. Anesthesia Post-op Instructions and Care and Recovery After Surgery       Upper Endoscopy, Adult, Care After This sheet gives you information about how to care for yourself after your procedure. Your health care provider may also give you more specific instructions. If you have problems or questions, contact your health care provider. What can I expect after the procedure? After the procedure, it is common to have:  A sore throat.  Mild stomach pain or discomfort.  Bloating.  Nausea. Follow these instructions at home:   Follow instructions from your health care provider about what to eat or drink after your procedure.  Return  to your normal activities as told by your health care provider. Ask your health care provider what activities are safe for you.  Take over-the-counter and prescription medicines only as told by your health care provider.  Do not drive for 24 hours if you were given a sedative during your procedure.  Keep all follow-up visits as told by your health care provider. This is important. Contact a health care provider if you have:  A sore throat that lasts longer than one day.  Trouble swallowing. Get help right away if:  You vomit blood or your vomit looks like coffee grounds.  You have: ? A fever. ? Bloody, black, or tarry stools. ? A severe sore throat or you cannot swallow. ? Difficulty breathing. ? Severe pain in your chest or abdomen. Summary  After the procedure, it is common to have a sore throat, mild stomach discomfort, bloating, and nausea.  Do not drive for 24 hours if you were given a sedative during the procedure.  Follow instructions from your health care provider about what to eat or drink after your procedure.  Return to your normal activities as told by your health care provider. This information is not intended to replace advice given to you by your health care provider. Make sure you discuss any questions you have with your health  care provider. Document Revised: 08/24/2017 Document Reviewed: 08/02/2017 Elsevier Patient Education  [place].  Esophageal Dilatation Esophageal dilatation, also called esophageal dilation, is a procedure to widen or open (dilate) a blocked or narrowed part of the esophagus. The esophagus is the part of the body that moves food and liquid from the mouth to the stomach. You may need this procedure if:  You have a buildup of scar tissue in your esophagus that makes it difficult, painful, or impossible to swallow. This can be caused by gastroesophageal reflux disease (GERD).  You have cancer of the esophagus.  There is a  problem with how food moves through your esophagus. In some cases, you may need this procedure repeated at a later time to dilate the esophagus gradually. Tell a health care provider about:  Any allergies you have.  All medicines you are taking, including vitamins, herbs, eye drops, creams, and over-the-counter medicines.  Any problems you or family members have had with anesthetic medicines.  Any blood disorders you have.  Any surgeries you have had.  Any medical conditions you have.  Any antibiotic medicines you are required to take before dental procedures.  Whether you are pregnant or may be pregnant. What are the risks? Generally, this is a safe procedure. However, problems may occur, including:  Bleeding due to a tear in the lining of the esophagus.  A hole (perforation) in the esophagus. What happens before the procedure?  Follow instructions from your health care provider about eating or drinking restrictions.  Ask your health care provider about changing or stopping your regular medicines. This is especially important if you are taking diabetes medicines or blood thinners.  Plan to have someone take you home from the hospital or clinic.  Plan to have a responsible adult care for you for at least 24 hours after you leave the hospital or clinic. This is important. What happens during the procedure?  You may be given a medicine to help you relax (sedative).  A numbing medicine may be sprayed into the back of your throat, or you may gargle the medicine.  Your health care provider may perform the dilatation using various surgical instruments, such as: ? Simple dilators. This instrument is carefully placed in the esophagus to stretch it. ? Guided wire bougies. This involves using an endoscope to insert a wire into the esophagus. A dilator is passed over this wire to enlarge the esophagus. Then the wire is removed. ? Balloon dilators. An endoscope with a small balloon at  the end is inserted into the esophagus. The balloon is inflated to stretch the esophagus and open it up. The procedure may vary among health care providers and hospitals. What happens after the procedure?  Your blood pressure, heart rate, breathing rate, and blood oxygen level will be monitored until the medicines you were given have worn off.  Your throat may feel slightly sore and numb. This will improve slowly over time.  You will not be allowed to eat or drink until your throat is no longer numb.  When you are able to drink, urinate, and sit on the edge of the bed without nausea or dizziness, you may be able to return home. Follow these instructions at home:  Take over-the-counter and prescription medicines only as told by your health care provider.  Do not drive for 24 hours if you were given a sedative during your procedure.  You should have a responsible adult with you for 24 hours after the procedure.  Follow instructions from your health care provider about any eating or drinking restrictions.  Do not use any products that contain nicotine or tobacco, such as cigarettes and e-cigarettes. If you need help quitting, ask your health care provider.  Keep all follow-up visits as told by your health care provider. This is important. Get help right away if you:  Have a fever.  Have chest pain.  Have pain that is not relieved by medication.  Have trouble breathing.  Have trouble swallowing.  Vomit blood. Summary  Esophageal dilatation, also called esophageal dilation, is a procedure to widen or open (dilate) a blocked or narrowed part of the esophagus.  Plan to have someone take you home from the hospital or clinic.  For this procedure, a numbing medicine may be sprayed into the back of your throat, or you may gargle the medicine.  Do not drive for 24 hours if you were given a sedative during your procedure. This information is not intended to replace advice given to  you by your health care provider. Make sure you discuss any questions you have with your health care provider. Document Revised: 12/28/2018 Document Reviewed: 01/05/2017 Elsevier Patient Education  2020 [place] After These instructions provide you with information about caring for yourself after your procedure. Your health care provider may also give you more specific instructions. Your treatment has been planned according to current medical practices, but problems sometimes occur. Call your health care provider if you have any problems or questions after your procedure. What can I expect after the procedure? After your procedure, you may:  Feel sleepy for several hours.  Feel clumsy and have poor balance for several hours.  Feel forgetful about what happened after the procedure.  Have poor judgment for several hours.  Feel nauseous or vomit.  Have a sore throat if you had a breathing tube during the procedure. Follow these instructions at home: For at least 24 hours after the procedure:      Have a responsible adult stay with you. It is important to have someone help care for you until you are awake and alert.  Rest as needed.  Do not: ? Participate in activities in which you could fall or become injured. ? Drive. ? Use heavy machinery. ? Drink alcohol. ? Take sleeping pills or medicines that cause drowsiness. ? Make important decisions or sign legal documents. ? Take care of children on your own. Eating and drinking  Follow the diet that is recommended by your health care provider.  If you vomit, drink water, juice, or soup when you can drink without vomiting.  Make sure you have little or no nausea before eating solid foods. General instructions  Take over-the-counter and prescription medicines only as told by your health care provider.  If you have sleep apnea, surgery and certain medicines can increase your risk for breathing  problems. Follow instructions from your health care provider about wearing your sleep device: ? Anytime you are sleeping, including during daytime naps. ? While taking prescription pain medicines, sleeping medicines, or medicines that make you drowsy.  If you smoke, do not smoke without supervision.  Keep all follow-up visits as told by your health care provider. This is important. Contact a health care provider if:  You keep feeling nauseous or you keep vomiting.  You feel light-headed.  You develop a rash.  You have a fever. Get help right away if:  You have trouble breathing. Summary  For several  hours after your procedure, you may feel sleepy and have poor judgment.  Have a responsible adult stay with you for at least 24 hours or until you are awake and alert. This information is not intended to replace advice given to you by your health care provider. Make sure you discuss any questions you have with your health care provider. Document Revised: 05/31/2017 Document Reviewed: 06/23/2015 Elsevier Patient Education  [place].

## 2020-01-17 ENCOUNTER — Encounter (HOSPITAL_COMMUNITY)
Admission: RE | Admit: 2020-01-17 | Discharge: 2020-01-17 | Disposition: A | Discharge status: Home / Self Care | Payer: Medicare Other | Source: Ambulatory Visit | Attending: Gastroenterology | Admitting: Gastroenterology

## 2020-01-17 ENCOUNTER — Other Ambulatory Visit (HOSPITAL_COMMUNITY): Payer: Self-pay | Admitting: Physician Assistant

## 2020-01-17 ENCOUNTER — Other Ambulatory Visit: Payer: Self-pay

## 2020-01-17 ENCOUNTER — Encounter (HOSPITAL_COMMUNITY): Payer: Self-pay

## 2020-01-17 ENCOUNTER — Other Ambulatory Visit (HOSPITAL_COMMUNITY)
Admission: RE | Admit: 2020-01-17 | Discharge: 2020-01-17 | Disposition: A | Discharge status: Home / Self Care | Payer: Medicare Other | Source: Ambulatory Visit | Attending: Gastroenterology | Admitting: Gastroenterology

## 2020-01-17 DIAGNOSIS — R928 Other abnormal and inconclusive findings on diagnostic imaging of breast: Secondary | ICD-10-CM

## 2020-01-17 DIAGNOSIS — Z01812 Encounter for preprocedural laboratory examination: Secondary | ICD-10-CM | POA: Insufficient documentation

## 2020-01-17 DIAGNOSIS — Z20822 Contact with and (suspected) exposure to covid-19: Secondary | ICD-10-CM | POA: Insufficient documentation

## 2020-01-17 LAB — CBC WITH DIFFERENTIAL/PLATELET
Abs Immature Granulocytes: 0.01 10*3/uL (ref 0.00–0.07)
Basophils Absolute: 0.1 10*3/uL (ref 0.0–0.1)
Basophils Relative: 1 %
Eosinophils Absolute: 0.1 10*3/uL (ref 0.0–0.5)
Eosinophils Relative: 3 %
HCT: 38.1 % (ref 36.0–46.0)
Hemoglobin: 12.4 g/dL (ref 12.0–15.0)
Immature Granulocytes: 0 %
Lymphocytes Relative: 31 %
Lymphs Abs: 1.7 10*3/uL (ref 0.7–4.0)
MCH: 32.7 pg (ref 26.0–34.0)
MCHC: 32.5 g/dL (ref 30.0–36.0)
MCV: 100.5 fL — ABNORMAL HIGH (ref 80.0–100.0)
Monocytes Absolute: 0.6 10*3/uL (ref 0.1–1.0)
Monocytes Relative: 11 %
Neutro Abs: 3 10*3/uL (ref 1.7–7.7)
Neutrophils Relative %: 54 %
Platelets: 321 10*3/uL (ref 150–400)
RBC: 3.79 MIL/uL — ABNORMAL LOW (ref 3.87–5.11)
RDW: 11.9 % (ref 11.5–15.5)
WBC: 5.5 10*3/uL (ref 4.0–10.5)
nRBC: 0 % (ref 0.0–0.2)

## 2020-01-17 LAB — BASIC METABOLIC PANEL
Anion gap: 12 (ref 5–15)
BUN: 11 mg/dL (ref 8–23)
CO2: 25 mmol/L (ref 22–32)
Calcium: 9.1 mg/dL (ref 8.9–10.3)
Chloride: 95 mmol/L — ABNORMAL LOW (ref 98–111)
Creatinine, Ser: 0.92 mg/dL (ref 0.44–1.00)
GFR, Estimated: >60 mL/min (ref >60)
Glucose, Bld: 87 mg/dL (ref 70–99)
Potassium: 4.1 mmol/L (ref 3.5–5.1)
Sodium: 132 mmol/L — ABNORMAL LOW (ref 135–145)

## 2020-01-17 LAB — SARS CORONAVIRUS 2 (TAT 6-24 HRS): SARS Coronavirus 2: NEGATIVE

## 2020-01-19 ENCOUNTER — Other Ambulatory Visit: Payer: Self-pay

## 2020-01-19 ENCOUNTER — Ambulatory Visit (HOSPITAL_COMMUNITY): Payer: Medicare Other | Admitting: Anesthesiology

## 2020-01-19 ENCOUNTER — Encounter (HOSPITAL_COMMUNITY): Payer: Self-pay | Admitting: Gastroenterology

## 2020-01-19 ENCOUNTER — Encounter (HOSPITAL_COMMUNITY)
Admission: RE | Disposition: A | Discharge status: Home / Self Care | Payer: Self-pay | Source: Home / Self Care | Attending: Gastroenterology

## 2020-01-19 ENCOUNTER — Ambulatory Visit (HOSPITAL_COMMUNITY)
Admission: RE | Admit: 2020-01-19 | Discharge: 2020-01-19 | Disposition: A | Discharge status: Home / Self Care | Payer: Medicare Other | Attending: Gastroenterology | Admitting: Gastroenterology

## 2020-01-19 DIAGNOSIS — Z87891 Personal history of nicotine dependence: Secondary | ICD-10-CM | POA: Insufficient documentation

## 2020-01-19 DIAGNOSIS — R1310 Dysphagia, unspecified: Secondary | ICD-10-CM | POA: Diagnosis not present

## 2020-01-19 DIAGNOSIS — E785 Hyperlipidemia, unspecified: Secondary | ICD-10-CM | POA: Diagnosis not present

## 2020-01-19 DIAGNOSIS — Z79899 Other long term (current) drug therapy: Secondary | ICD-10-CM | POA: Insufficient documentation

## 2020-01-19 DIAGNOSIS — K222 Esophageal obstruction: Secondary | ICD-10-CM | POA: Diagnosis not present

## 2020-01-19 DIAGNOSIS — I10 Essential (primary) hypertension: Secondary | ICD-10-CM | POA: Diagnosis not present

## 2020-01-19 DIAGNOSIS — J45909 Unspecified asthma, uncomplicated: Secondary | ICD-10-CM | POA: Diagnosis not present

## 2020-01-19 DIAGNOSIS — K449 Diaphragmatic hernia without obstruction or gangrene: Secondary | ICD-10-CM | POA: Insufficient documentation

## 2020-01-19 HISTORY — PX: ESOPHAGOGASTRODUODENOSCOPY (EGD) WITH PROPOFOL: SHX5813

## 2020-01-19 HISTORY — PX: ESOPHAGEAL DILATION: SHX303

## 2020-01-19 SURGERY — ESOPHAGOGASTRODUODENOSCOPY (EGD) WITH PROPOFOL
Anesthesia: General

## 2020-01-19 MED ORDER — LIDOCAINE VISCOUS HCL 2 % MT SOLN
OROMUCOSAL | Status: AC
  Filled 2020-01-19: qty 15

## 2020-01-19 MED ORDER — LIDOCAINE VISCOUS HCL 2 % MT SOLN
15.0000 mL | Freq: Once | OROMUCOSAL | Status: AC
  Administered 2020-01-19: 15 mL via OROMUCOSAL

## 2020-01-19 MED ORDER — LACTATED RINGERS IV SOLN: INTRAVENOUS | Status: DC

## 2020-01-19 MED ORDER — PROPOFOL 500 MG/50ML IV EMUL
INTRAVENOUS | Status: DC | PRN
  Administered 2020-01-19: 150 ug/kg/min via INTRAVENOUS

## 2020-01-19 MED ORDER — PROPOFOL 10 MG/ML IV BOLUS
INTRAVENOUS | Status: DC | PRN
  Administered 2020-01-19: 60 mg via INTRAVENOUS
  Administered 2020-01-19: 30 mg via INTRAVENOUS
  Administered 2020-01-19 (×3): 20 mg via INTRAVENOUS

## 2020-01-19 MED ORDER — GLYCOPYRROLATE 0.2 MG/ML IJ SOLN
INTRAMUSCULAR | Status: AC
  Filled 2020-01-19: qty 1

## 2020-01-19 MED ORDER — GLYCOPYRROLATE 0.2 MG/ML IJ SOLN
0.2000 mg | Freq: Once | INTRAMUSCULAR | Status: AC
  Administered 2020-01-19: .2 mg via INTRAVENOUS

## 2020-01-19 NOTE — Anesthesia Postprocedure Evaluation (Signed)
Anesthesia Post Note  Patient: [employer]  Procedure(s) Performed: ESOPHAGOGASTRODUODENOSCOPY (EGD) WITH PROPOFOL (N/A ) ESOPHAGEAL DILATION (N/A )  Patient location during evaluation: PACU Anesthesia Type: General Level of consciousness: awake, oriented and patient cooperative Pain management: pain level controlled Vital Signs Assessment: post-procedure vital signs reviewed and stable Respiratory status: spontaneous breathing, respiratory function stable and nonlabored ventilation Cardiovascular status: stable Postop Assessment: no apparent nausea or vomiting Anesthetic complications: no   No complications documented.   Last Vitals:  Vitals:   01/19/20 1011  BP: (!) 149/97  Pulse: (!) 102  Resp: 20  Temp: (!) 36.4 C  SpO2: 97%    Last Pain:  Vitals:   01/19/20 1014  TempSrc:   PainSc: 0-No pain                 Celia Friedland

## 2020-01-19 NOTE — Interval H&P Note (Signed)
History and Physical Interval Note:  01/19/2020 10:11 AM  Marilyn Haas is a 79 y.o. female with past medical history of breast cancer, anxiety, anemia, depression, GERD, dysphagia due to Schatzki's ring who comes to the hospital for further evaluation of dysphagia.  Patient was last seen in clinic on 01/11/2020.  She came with her son at that time.  She has presented persistent dysphagia, mostly to solids and has been mostly eating ice cream or pudding but has presented recurrent choking episodes when she eats more solid food.  Has had 2 induce vomiting a few times due to discomfort it causes. The patient denies having any nausea, vomiting, fever, chills, hematochezia, melena, hematemesis, abdominal distention, abdominal pain, diarrhea, jaundice, pruritus or weight loss.  Patient underwent an EGD on 2019, where she was found to have an obstructive Schatzki's ring at the GE junction which was dilated up to 18 mm with a balloon  BP (!) 149/97   Pulse (!) 102   Temp (!) 97.5 F (36.4 C) (Oral)   Resp 20   SpO2 97%  GENERAL: The patient is AO x3, in no acute distress. [place]. HEENT: Head is normocephalic and atraumatic. EOMI are intact. Mouth is well hydrated and without lesions. NECK: Supple. No masses LUNGS: Clear to auscultation. No presence of rhonchi/wheezing/rales. Adequate chest expansion HEART: RRR, normal s1 and s2. ABDOMEN: Soft, nontender, no guarding, no peritoneal signs, and nondistended. BS +. No masses. EXTREMITIES: Without any cyanosis, clubbing, rash, lesions or edema. NEUROLOGIC: AOx3, no focal motor deficit. SKIN: no jaundice, no rashes  [name]  has presented today for surgery, with the diagnosis of dysphagia.  The various methods of treatment have been discussed with the patient and family. After consideration of risks, benefits and other options for treatment, the patient has consented to  Procedure(s) with comments: ESOPHAGOGASTRODUODENOSCOPY (EGD) WITH  PROPOFOL (N/A) - 1200 ESOPHAGEAL DILATION (N/A) as a surgical intervention.  The patient's history has been reviewed, patient examined, no change in status, stable for surgery.  I have reviewed the patient's chart and labs.  Questions were answered to the patient's satisfaction.     [name]

## 2020-01-19 NOTE — Discharge Instructions (Signed)
You are being discharged to home.  Resume your previous diet.  Your physician has recommended a repeat upper endoscopy in four weeks for surveillance.  Continue your present medications, including omeprazole 40 mg BID.  Upper Endoscopy, Adult, Care After This sheet gives you information about how to care for yourself after your procedure. Your health care provider may also give you more specific instructions. If you have problems or questions, contact your health care provider. What can I expect after the procedure? After the procedure, it is common to have:  A sore throat.  Mild stomach pain or discomfort.  Bloating.  Nausea. Follow these instructions at home:   Follow instructions from your health care provider about what to eat or drink after your procedure.  Return to your normal activities as told by your health care provider. Ask your health care provider what activities are safe for you.  Take over-the-counter and prescription medicines only as told by your health care provider.  Do not drive for 24 hours if you were given a sedative during your procedure.  Keep all follow-up visits as told by your health care provider. This is important. Contact a health care provider if you have:  A sore throat that lasts longer than one day.  Trouble swallowing. Get help right away if:  You vomit blood or your vomit looks like coffee grounds.  You have: ? A fever. ? Bloody, black, or tarry stools. ? A severe sore throat or you cannot swallow. ? Difficulty breathing. ? Severe pain in your chest or abdomen. Summary  After the procedure, it is common to have a sore throat, mild stomach discomfort, bloating, and nausea.  Do not drive for 24 hours if you were given a sedative during the procedure.  Follow instructions from your health care provider about what to eat or drink after your procedure.  Return to your normal activities as told by your health care provider. This  information is not intended to replace advice given to you by your health care provider. Make sure you discuss any questions you have with your health care provider. Document Revised: 08/24/2017 Document Reviewed: 08/02/2017 Elsevier Patient Education  [place].

## 2020-01-19 NOTE — Anesthesia Preprocedure Evaluation (Signed)
Anesthesia Evaluation  Patient identified by MRN, date of birth, ID band Patient awake    Reviewed: Allergy & Precautions, H&P , NPO status , Patient's Chart, lab work & pertinent test results, reviewed documented beta blocker date and time   Airway Mallampati: II  TM Distance: >3 FB Neck ROM: full    Dental no notable dental hx. (+) Teeth Intact   Pulmonary asthma , former smoker,    Pulmonary exam normal breath sounds clear to auscultation       Cardiovascular Exercise Tolerance: Good hypertension,  Rhythm:regular Rate:Normal     Neuro/Psych  Headaches, PSYCHIATRIC DISORDERS Anxiety Depression    GI/Hepatic Neg liver ROS, hiatal hernia, GERD  Medicated,  Endo/Other  negative endocrine ROS  Renal/GU negative Renal ROS  negative genitourinary   Musculoskeletal   Abdominal   Peds  Hematology  (+) Blood dyscrasia, anemia ,   Anesthesia Other Findings   Reproductive/Obstetrics negative OB ROS                             Anesthesia Physical Anesthesia Plan  ASA: III  Anesthesia Plan: General   Post-op Pain Management:    Induction:   PONV Risk Score and Plan: Propofol infusion  Airway Management Planned:   Additional Equipment:   Intra-op Plan:   Post-operative Plan:   Informed Consent: I have reviewed the patients History and Physical, chart, labs and discussed the procedure including the risks, benefits and alternatives for the proposed anesthesia with the patient or authorized representative who has indicated his/her understanding and acceptance.     Dental Advisory Given  Plan Discussed with: CRNA  Anesthesia Plan Comments:         Anesthesia Quick Evaluation

## 2020-01-19 NOTE — Op Note (Signed)
[facility] Patient Name: Marilyn Haas Procedure Date: 01/19/2020 10:08 AM MRN: [id number] Date of Birth: 1940-11-26 Attending MD: Maylon Peppers ,  CSN: [id number] Age: 79 Admit Type: Outpatient Procedure:                Upper GI endoscopy Indications:              Dysphagia Providers:                Maylon Peppers, Crystal Page, Nelma Rothman,                            Technician Referring MD:              Medicines:                Monitored Anesthesia Care Complications:            No immediate complications. Estimated Blood Loss:     Estimated blood loss: none. Procedure:                Pre-Anesthesia Assessment:                           - Prior to the procedure, a History and Physical                            was performed, and patient medications, allergies                            and sensitivities were reviewed. The patient's                            tolerance of previous anesthesia was reviewed.                           - The risks and benefits of the procedure and the                            sedation options and risks were discussed with the                            patient. All questions were answered and informed                            consent was obtained.                           - ASA Grade Assessment: III - A patient with severe                            systemic disease.                           After obtaining informed consent, the endoscope was                            passed under direct vision. Throughout the  procedure, the patient's blood pressure, pulse, and                            oxygen saturations were monitored continuously. The                            GIF-H190 ([id number]) scope was introduced through the                            mouth, and advanced to the second part of duodenum.                            The patient tolerated the procedure well. The upper                            GI  endoscopy was accomplished without difficulty. Scope In: 10:20:59 AM Scope Out: 10:35:12 AM Total Procedure Duration: 0 hours 14 minutes 13 seconds  Findings:      A non-obstructing Schatzki ring was found at the gastroesophageal       junction. A TTS dilator was passed through the scope. Dilation with a       12-25-10 mm balloon dilator was performed to 12 mm. There was presence       of mucosal disruption with heme after dilation was performed.      An 8 cm hiatal hernia was present.      The entire examined stomach was normal.      The examined duodenum was normal. Impression:               - Non-obstructing Schatzki ring. Dilated.                           - 8 cm hiatal hernia.                           - Normal stomach.                           - Normal examined duodenum.                           - No specimens collected. Moderate Sedation:      Per Anesthesia Care Recommendation:           - Discharge patient to home (ambulatory).                           - Resume previous diet.                           - Repeat upper endoscopy in 4 weeks for                            surveillance.                           - Continue present medications, including  omeprazole 40 mg BID. Procedure Code(s):        --- Professional ---                           [phone], GC, Esophagogastroduodenoscopy, flexible,                            transoral; with transendoscopic balloon dilation of                            esophagus (less than 30 mm diameter) Diagnosis Code(s):        --- Professional ---                           K22.2, Esophageal obstruction                           K44.9, Diaphragmatic hernia without obstruction or                            gangrene                           R13.10, Dysphagia, unspecified CPT copyright 2019 American Medical Association. All rights reserved. The codes documented in this report are preliminary and upon coder review may   be revised to meet current compliance requirements. Maylon Peppers, MD Maylon Peppers,  01/19/2020 10:48:01 AM This report has been signed electronically. Number of Addenda: 0

## 2020-01-19 NOTE — Transfer of Care (Signed)
Immediate Anesthesia Transfer of Care Note  Patient: Marilyn Haas  Procedure(s) Performed: ESOPHAGOGASTRODUODENOSCOPY (EGD) WITH PROPOFOL (N/A ) ESOPHAGEAL DILATION (N/A )  Patient Location: PACU  Anesthesia Type:General  Level of Consciousness: awake, alert , oriented and patient cooperative  Airway & Oxygen Therapy: Patient Spontanous Breathing  Post-op Assessment: Report given to RN, Post -op Vital signs reviewed and stable and Patient moving all extremities X 4  Post vital signs: Reviewed and stable  Last Vitals:  Vitals Value Taken Time  BP    Temp    Pulse 102 01/19/20 1041  Resp    SpO2 99 % 01/19/20 1041  Vitals shown include unvalidated device data.  Last Pain:  Vitals:   01/19/20 1014  TempSrc:   PainSc: 0-No pain      Patients Stated Pain Goal: 7 (12/13/55 4734)  Complications: No complications documented.

## 2020-01-24 ENCOUNTER — Encounter (HOSPITAL_COMMUNITY): Payer: Self-pay | Admitting: Gastroenterology

## 2020-01-31 ENCOUNTER — Other Ambulatory Visit: Payer: Self-pay | Admitting: Oncology

## 2020-02-05 DIAGNOSIS — T8484XA Pain due to internal orthopedic prosthetic devices, implants and grafts, initial encounter: Secondary | ICD-10-CM | POA: Diagnosis not present

## 2020-02-05 DIAGNOSIS — G894 Chronic pain syndrome: Secondary | ICD-10-CM | POA: Diagnosis not present

## 2020-02-07 ENCOUNTER — Telehealth (INDEPENDENT_AMBULATORY_CARE_PROVIDER_SITE_OTHER): Payer: Self-pay

## 2020-02-07 NOTE — Telephone Encounter (Signed)
Noted thank you

## 2020-02-07 NOTE — Telephone Encounter (Signed)
Hi Marilyn Haas That should be fine. If Dr. Laural Golden can't do it then we will need to schedule for the earliest one in January  Marilyn Peppers, MD Gastroenterology and Hepatology [facility]

## 2020-02-07 NOTE — Telephone Encounter (Signed)
Marilyn Sherilyn Banker just gave me the information on Marilyn Haas repeat EGD in 4 weeks from 01/19/20, she was done by you in [facility] and you have nothing coming up until after the first of the year in [facility], can Marilyn Laural Golden do her on his propofol day on Dec 15th ? Please advise?

## 2020-02-07 NOTE — Telephone Encounter (Signed)
Error

## 2020-02-12 ENCOUNTER — Other Ambulatory Visit: Payer: Self-pay | Admitting: *Deleted

## 2020-02-12 ENCOUNTER — Other Ambulatory Visit (INDEPENDENT_AMBULATORY_CARE_PROVIDER_SITE_OTHER): Payer: Self-pay

## 2020-02-12 ENCOUNTER — Encounter (INDEPENDENT_AMBULATORY_CARE_PROVIDER_SITE_OTHER): Payer: Self-pay

## 2020-02-12 DIAGNOSIS — R1319 Other dysphagia: Secondary | ICD-10-CM

## 2020-02-13 ENCOUNTER — Telehealth: Payer: Self-pay | Admitting: Oncology

## 2020-02-13 ENCOUNTER — Encounter (INDEPENDENT_AMBULATORY_CARE_PROVIDER_SITE_OTHER): Payer: Self-pay

## 2020-02-13 NOTE — Telephone Encounter (Signed)
Scheduled appt per 11/29 sch msg - unable to reach pt. Left message for patient with appt date and time

## 2020-02-15 ENCOUNTER — Inpatient Hospital Stay: Payer: Medicare Other | Admitting: Adult Health

## 2020-02-19 ENCOUNTER — Inpatient Hospital Stay: Payer: Medicare Other | Admitting: Adult Health

## 2020-02-26 NOTE — Patient Instructions (Signed)
[name]  02/26/2020     @PREFPERIOPPHARMACY @   Your procedure is scheduled on  02/28/2020.  Report to [name] at  1315 (1:15) P.M.  Call this number if you have problems the morning of surgery:  [phone]   Remember:  Follow the diet instructions given to you by the office.                       Take these medicines the morning of surgery with A SIP OF WATER  Abilify, cymbalta, gabapentin, hydrocodone(If needed), prilosec, tamoxifen.    Do not wear jewelry, make-up or nail polish.  Do not wear lotions, powders, or perfumes. Please wear deodorant and brush your teeth.  Do not shave 48 hours prior to surgery.  Men may shave face and neck.  Do not bring valuables to the hospital.  [facility] is not responsible for any belongings or valuables.  Contacts, dentures or bridgework may not be worn into surgery.  Leave your suitcase in the car.  After surgery it may be brought to your room.  For patients admitted to the hospital, discharge time will be determined by your treatment team.  Patients discharged the day of surgery will not be allowed to drive home.   Name and phone number of your driver:   family Special instructions:  DO NOT smoke the morning of your procedure.  Please read over the following fact sheets that you were given. Anesthesia Post-op Instructions and Care and Recovery After Surgery       Upper Endoscopy, Adult, Care After This sheet gives you information about how to care for yourself after your procedure. Your health care provider may also give you more specific instructions. If you have problems or questions, contact your health care provider. What can I expect after the procedure? After the procedure, it is common to have:  A sore throat.  Mild stomach pain or discomfort.  Bloating.  Nausea. Follow these instructions at home:   Follow instructions from your health care provider about what to eat or drink after your  procedure.  Return to your normal activities as told by your health care provider. Ask your health care provider what activities are safe for you.  Take over-the-counter and prescription medicines only as told by your health care provider.  Do not drive for 24 hours if you were given a sedative during your procedure.  Keep all follow-up visits as told by your health care provider. This is important. Contact a health care provider if you have:  A sore throat that lasts longer than one day.  Trouble swallowing. Get help right away if:  You vomit blood or your vomit looks like coffee grounds.  You have: ? A fever. ? Bloody, black, or tarry stools. ? A severe sore throat or you cannot swallow. ? Difficulty breathing. ? Severe pain in your chest or abdomen. Summary  After the procedure, it is common to have a sore throat, mild stomach discomfort, bloating, and nausea.  Do not drive for 24 hours if you were given a sedative during the procedure.  Follow instructions from your health care provider about what to eat or drink after your procedure.  Return to your normal activities as told by your health care provider. This information is not intended to replace advice given to you by your health care provider. Make sure you discuss any questions you have with your health care provider.  Document Revised: 08/24/2017 Document Reviewed: 08/02/2017 Elsevier Patient Education  2020 [place] After These instructions provide you with information about caring for yourself after your procedure. Your health care provider may also give you more specific instructions. Your treatment has been planned according to current medical practices, but problems sometimes occur. Call your health care provider if you have any problems or questions after your procedure. What can I expect after the procedure? After your procedure, you may:  Feel sleepy for several  hours.  Feel clumsy and have poor balance for several hours.  Feel forgetful about what happened after the procedure.  Have poor judgment for several hours.  Feel nauseous or vomit.  Have a sore throat if you had a breathing tube during the procedure. Follow these instructions at home: For at least 24 hours after the procedure:      Have a responsible adult stay with you. It is important to have someone help care for you until you are awake and alert.  Rest as needed.  Do not: ? Participate in activities in which you could fall or become injured. ? Drive. ? Use heavy machinery. ? Drink alcohol. ? Take sleeping pills or medicines that cause drowsiness. ? Make important decisions or sign legal documents. ? Take care of children on your own. Eating and drinking  Follow the diet that is recommended by your health care provider.  If you vomit, drink water, juice, or soup when you can drink without vomiting.  Make sure you have little or no nausea before eating solid foods. General instructions  Take over-the-counter and prescription medicines only as told by your health care provider.  If you have sleep apnea, surgery and certain medicines can increase your risk for breathing problems. Follow instructions from your health care provider about wearing your sleep device: ? Anytime you are sleeping, including during daytime naps. ? While taking prescription pain medicines, sleeping medicines, or medicines that make you drowsy.  If you smoke, do not smoke without supervision.  Keep all follow-up visits as told by your health care provider. This is important. Contact a health care provider if:  You keep feeling nauseous or you keep vomiting.  You feel light-headed.  You develop a rash.  You have a fever. Get help right away if:  You have trouble breathing. Summary  For several hours after your procedure, you may feel sleepy and have poor judgment.  Have a  responsible adult stay with you for at least 24 hours or until you are awake and alert. This information is not intended to replace advice given to you by your health care provider. Make sure you discuss any questions you have with your health care provider. Document Revised: 05/31/2017 Document Reviewed: 06/23/2015 Elsevier Patient Education  [place].

## 2020-02-27 ENCOUNTER — Other Ambulatory Visit: Payer: Self-pay

## 2020-02-27 ENCOUNTER — Encounter (HOSPITAL_COMMUNITY)
Admission: RE | Admit: 2020-02-27 | Discharge: 2020-02-27 | Disposition: A | Discharge status: Home / Self Care | Payer: Medicare Other | Source: Ambulatory Visit | Attending: Internal Medicine | Admitting: Internal Medicine

## 2020-02-27 ENCOUNTER — Other Ambulatory Visit (HOSPITAL_COMMUNITY)
Admission: RE | Admit: 2020-02-27 | Discharge: 2020-02-27 | Disposition: A | Discharge status: Home / Self Care | Payer: Medicare Other | Source: Ambulatory Visit | Attending: Internal Medicine | Admitting: Internal Medicine

## 2020-02-27 ENCOUNTER — Encounter (HOSPITAL_COMMUNITY): Payer: Self-pay

## 2020-02-27 DIAGNOSIS — Z01812 Encounter for preprocedural laboratory examination: Secondary | ICD-10-CM | POA: Diagnosis not present

## 2020-02-27 DIAGNOSIS — Z20822 Contact with and (suspected) exposure to covid-19: Secondary | ICD-10-CM | POA: Insufficient documentation

## 2020-02-27 LAB — SARS CORONAVIRUS 2 (TAT 6-24 HRS): SARS Coronavirus 2: NEGATIVE

## 2020-02-28 ENCOUNTER — Ambulatory Visit (HOSPITAL_COMMUNITY): Payer: Medicare Other | Admitting: Anesthesiology

## 2020-02-28 ENCOUNTER — Encounter (HOSPITAL_COMMUNITY)
Admission: RE | Disposition: A | Discharge status: Home / Self Care | Payer: Self-pay | Source: Home / Self Care | Attending: Internal Medicine

## 2020-02-28 ENCOUNTER — Telehealth (INDEPENDENT_AMBULATORY_CARE_PROVIDER_SITE_OTHER): Payer: Self-pay | Admitting: *Deleted

## 2020-02-28 ENCOUNTER — Other Ambulatory Visit: Payer: Self-pay

## 2020-02-28 ENCOUNTER — Ambulatory Visit (HOSPITAL_COMMUNITY)
Admission: RE | Admit: 2020-02-28 | Discharge: 2020-02-28 | Disposition: A | Discharge status: Home / Self Care | Payer: Medicare Other | Attending: Internal Medicine | Admitting: Internal Medicine

## 2020-02-28 ENCOUNTER — Encounter (HOSPITAL_COMMUNITY): Payer: Self-pay | Admitting: Internal Medicine

## 2020-02-28 DIAGNOSIS — I10 Essential (primary) hypertension: Secondary | ICD-10-CM | POA: Diagnosis not present

## 2020-02-28 DIAGNOSIS — R1314 Dysphagia, pharyngoesophageal phase: Secondary | ICD-10-CM | POA: Diagnosis not present

## 2020-02-28 DIAGNOSIS — K219 Gastro-esophageal reflux disease without esophagitis: Secondary | ICD-10-CM | POA: Diagnosis not present

## 2020-02-28 DIAGNOSIS — R1319 Other dysphagia: Secondary | ICD-10-CM

## 2020-02-28 DIAGNOSIS — Z87891 Personal history of nicotine dependence: Secondary | ICD-10-CM | POA: Diagnosis not present

## 2020-02-28 DIAGNOSIS — Z803 Family history of malignant neoplasm of breast: Secondary | ICD-10-CM | POA: Insufficient documentation

## 2020-02-28 DIAGNOSIS — K449 Diaphragmatic hernia without obstruction or gangrene: Secondary | ICD-10-CM | POA: Insufficient documentation

## 2020-02-28 DIAGNOSIS — K222 Esophageal obstruction: Secondary | ICD-10-CM | POA: Insufficient documentation

## 2020-02-28 DIAGNOSIS — Q394 Esophageal web: Secondary | ICD-10-CM | POA: Insufficient documentation

## 2020-02-28 DIAGNOSIS — Z79899 Other long term (current) drug therapy: Secondary | ICD-10-CM | POA: Insufficient documentation

## 2020-02-28 DIAGNOSIS — J45909 Unspecified asthma, uncomplicated: Secondary | ICD-10-CM | POA: Diagnosis not present

## 2020-02-28 DIAGNOSIS — Z818 Family history of other mental and behavioral disorders: Secondary | ICD-10-CM | POA: Insufficient documentation

## 2020-02-28 DIAGNOSIS — Z853 Personal history of malignant neoplasm of breast: Secondary | ICD-10-CM | POA: Insufficient documentation

## 2020-02-28 DIAGNOSIS — Z923 Personal history of irradiation: Secondary | ICD-10-CM | POA: Insufficient documentation

## 2020-02-28 DIAGNOSIS — Z833 Family history of diabetes mellitus: Secondary | ICD-10-CM | POA: Diagnosis not present

## 2020-02-28 DIAGNOSIS — Z888 Allergy status to other drugs, medicaments and biological substances status: Secondary | ICD-10-CM | POA: Insufficient documentation

## 2020-02-28 DIAGNOSIS — Z86010 Personal history of colonic polyps: Secondary | ICD-10-CM | POA: Diagnosis not present

## 2020-02-28 DIAGNOSIS — Z8249 Family history of ischemic heart disease and other diseases of the circulatory system: Secondary | ICD-10-CM | POA: Diagnosis not present

## 2020-02-28 DIAGNOSIS — R1310 Dysphagia, unspecified: Secondary | ICD-10-CM | POA: Diagnosis present

## 2020-02-28 HISTORY — PX: ESOPHAGEAL DILATION: SHX303

## 2020-02-28 HISTORY — PX: ESOPHAGOGASTRODUODENOSCOPY (EGD) WITH PROPOFOL: SHX5813

## 2020-02-28 SURGERY — ESOPHAGOGASTRODUODENOSCOPY (EGD) WITH PROPOFOL
Anesthesia: General

## 2020-02-28 MED ORDER — LIDOCAINE HCL (PF) 2 % IJ SOLN
INTRAMUSCULAR | Status: AC
  Filled 2020-02-28: qty 10

## 2020-02-28 MED ORDER — STERILE WATER FOR IRRIGATION IR SOLN
Status: DC | PRN
  Administered 2020-02-28: 1.5 mL

## 2020-02-28 MED ORDER — CHLORHEXIDINE GLUCONATE CLOTH 2 % EX PADS: 6.0000 | MEDICATED_PAD | Freq: Once | CUTANEOUS | Status: DC

## 2020-02-28 MED ORDER — LACTATED RINGERS IV SOLN: INTRAVENOUS | Status: DC

## 2020-02-28 MED ORDER — PROPOFOL 10 MG/ML IV BOLUS
INTRAVENOUS | Status: DC | PRN
  Administered 2020-02-28: 50 mg via INTRAVENOUS
  Administered 2020-02-28: 100 mg via INTRAVENOUS
  Administered 2020-02-28: 80 mg via INTRAVENOUS
  Administered 2020-02-28: 60 mg via INTRAVENOUS
  Administered 2020-02-28: 50 mg via INTRAVENOUS

## 2020-02-28 MED ORDER — GLYCOPYRROLATE 0.2 MG/ML IJ SOLN
INTRAMUSCULAR | Status: AC
  Filled 2020-02-28: qty 1

## 2020-02-28 MED ORDER — LIDOCAINE HCL (PF) 2 % IJ SOLN
INTRAMUSCULAR | Status: AC
  Filled 2020-02-28: qty 5

## 2020-02-28 MED ORDER — LIDOCAINE VISCOUS HCL 2 % MT SOLN
15.0000 mL | Freq: Once | OROMUCOSAL | Status: AC
  Administered 2020-02-28: 15 mL via OROMUCOSAL

## 2020-02-28 MED ORDER — GLYCOPYRROLATE 0.2 MG/ML IJ SOLN
0.2000 mg | Freq: Once | INTRAMUSCULAR | Status: AC
  Administered 2020-02-28: 0.2 mg via INTRAVENOUS

## 2020-02-28 MED ORDER — LIDOCAINE VISCOUS HCL 2 % MT SOLN
OROMUCOSAL | Status: AC
  Filled 2020-02-28: qty 15

## 2020-02-28 MED ORDER — PROPOFOL 10 MG/ML IV BOLUS
INTRAVENOUS | Status: AC
  Filled 2020-02-28: qty 40

## 2020-02-28 MED ORDER — LIDOCAINE HCL (CARDIAC) PF 50 MG/5ML IV SOSY
PREFILLED_SYRINGE | INTRAVENOUS | Status: DC | PRN
  Administered 2020-02-28: 60 mg via INTRAVENOUS

## 2020-02-28 NOTE — Anesthesia Postprocedure Evaluation (Signed)
Anesthesia Post Note  Patient: [employer]  Procedure(s) Performed: ESOPHAGOGASTRODUODENOSCOPY (EGD) WITH PROPOFOL (N/A ) ESOPHAGEAL DILATION  Patient location during evaluation: PACU Anesthesia Type: General Level of consciousness: awake and alert and patient cooperative Pain management: satisfactory to patient Vital Signs Assessment: post-procedure vital signs reviewed and stable Respiratory status: spontaneous breathing Cardiovascular status: stable Postop Assessment: no apparent nausea or vomiting Anesthetic complications: no   No complications documented.   Last Vitals:  Vitals:   02/28/20 1347 02/28/20 1552  BP: (!) 164/86 (!) 165/75  Pulse: 95   Resp: 17 15  Temp: 36.9 C   SpO2: 95%     Last Pain:  Vitals:   02/28/20 1529  TempSrc:   PainSc: 0-No pain                 Anwyn Kriegel

## 2020-02-28 NOTE — Telephone Encounter (Deleted)
Per op note - patient Repeat upper endoscopy in 4 weeks for surveillance

## 2020-02-28 NOTE — Telephone Encounter (Signed)
This was from 01/19/20 op note - disregard

## 2020-02-28 NOTE — H&P (Signed)
Marilyn Haas is an 79 y.o. female.   Chief Complaint: Patient is here for esophagogastroduodenoscopy and esophageal dilation HPI: Patient is 79 year old Caucasian female who presents with solid food dysphagia. She states she has had it for 3 to 4 months. She also had difficulty swallowing pills. She has a history of high-grade Schatzki's ring which was last dilated disrupted July 2019. She denies nausea vomiting. She feels heartburn is well controlled with therapy. She has good appetite. She says she has gained 10 pounds this year. She denies abdominal pain or melena. Does not take aspirin or anticoagulants.  Past Medical History:  Diagnosis Date  . Anemia   . Anxiety   . Arthritis   . Breast cancer ([place]) 05/19/2016   Right breast  . Childhood asthma   . Chronic back pain    Scoliosis, stenosis  . Depression   . Diverticulosis   . GERD (gastroesophageal reflux disease)   . Hard of hearing   . Headache   . History of blood transfusion   . History of colon polyps   . History of hiatal hernia   . History of pneumonia   . Insomnia   . Osteopenia   . Osteoporosis   . Personal history of radiation therapy   . Thickened endometrium 09/29/2017    Past Surgical History:  Procedure Laterality Date  . BREAST LUMPECTOMY WITH RADIOACTIVE SEED AND SENTINEL LYMPH NODE BIOPSY Right 07/31/2016   Procedure: BREAST LUMPECTOMY WITH RADIOACTIVE SEED AND SENTINEL LYMPH NODE BIOPSY, INJECT BLUE DYE RIGHT BREAST;  Surgeon: Fanny Skates, MD;  Location: [facility];  Service: General;  Laterality: Right;  . COLONOSCOPY    . 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 ESOPHAGEAL DILATION N/A 09/24/2017   Procedure: ESOPHAGEAL DILATION;  Surgeon: Rogene Houston, MD;  [facility];  Service: Endoscopy;  Laterality: N/A;  . ESOPHAGEAL DILATION N/A  01/19/2020   Procedure: ESOPHAGEAL DILATION;  Surgeon: Harvel Quale, MD;  [facility];  Service: Gastroenterology;  Laterality: N/A;  . ESOPHAGOGASTRODUODENOSCOPY N/A 01/26/2014   Procedure: ESOPHAGOGASTRODUODENOSCOPY (EGD);  Surgeon: Rogene Houston, MD;  [facility];  Service: Endoscopy;  Laterality: N/A;  1030  . ESOPHAGOGASTRODUODENOSCOPY (EGD) WITH PROPOFOL N/A 09/24/2017   Procedure: ESOPHAGOGASTRODUODENOSCOPY (EGD) WITH PROPOFOL;  Surgeon: Rogene Houston, MD;  [facility];  Service: Endoscopy;  Laterality: N/A;  11:15  . ESOPHAGOGASTRODUODENOSCOPY (EGD) WITH PROPOFOL N/A 01/19/2020   Procedure: ESOPHAGOGASTRODUODENOSCOPY (EGD) WITH PROPOFOL;  Surgeon: Harvel Quale, MD;  [facility];  Service: Gastroenterology;  Laterality: N/A;  1200  . HIP FRACTURE SURGERY     05/2009 left -[facility]  . HYSTEROSCOPY WITH D & C  05/24/2018   Procedure: DILATATION AND CURETTAGE /HYSTEROSCOPY (PROCEDURE # 2)/PAP SMEAR (PROCEDURE #1);  Surgeon: Jonnie Kind, MD;  Location: [facility];  Service: Gynecology;;  . Venia Minks DILATION N/A 01/26/2014   Procedure: Keturah Shavers;  Surgeon: Rogene Houston, MD;  [facility];  Service: Endoscopy;  Laterality: N/A;    Family History  Problem Relation Age of Onset  . Heart failure Mother   . Diabetes Mother   . Hypertension Mother   . Depression Mother   . Other Father        stomach ulcers; abd aneursym  . Hypertension Sister   . Heart  failure Sister   . Diabetes Sister   . Depression Sister   . Diabetes Sister        prediabetes  . Cancer Maternal Aunt        breast   Social History:  reports that she quit smoking about 59 years ago. Her smoking use included cigarettes. She has a 1.00 pack-year smoking history. She has never used smokeless tobacco. She reports current alcohol use. She reports that she does not use drugs.  Allergies:  Allergies  Allergen Reactions  . Avelox  [Moxifloxacin Hcl In Nacl] Other (See Comments)    Altered mental status    Medications Prior to Admission  Medication Sig Dispense Refill  . ALPRAZolam (XANAX) 0.25 MG tablet Take 0.25 mg by mouth at bedtime.    . ARIPiprazole (ABILIFY) 2 MG tablet Take 2 mg by mouth daily.    . DULoxetine (CYMBALTA) 60 MG capsule Take 120 mg by mouth daily.    . mirtazapine (REMERON) 15 MG tablet Take 1 tablet (15 mg total) by mouth at bedtime. 90 tablet 0  . omeprazole (PRILOSEC) 40 MG capsule Take 1 capsule (40 mg total) by mouth in the morning and at bedtime. 60 capsule 3  . sucralfate (CARAFATE) 1 g tablet Take 1 tablet (1 g total) by mouth 4 (four) times daily. 30 tablet 0  . tamoxifen (NOLVADEX) 20 MG tablet TAKE ONE TABLET (20MG  TOTAL) BY MOUTH DAILY 30 tablet 0  . cyanocobalamin (,VITAMIN B-12,) 1000 MCG/ML injection Inject 1,000 mcg into the muscle every 30 (thirty) days. (Patient not taking: No sig reported)    . docusate sodium (COLACE) 100 MG capsule Take 2 capsules (200 mg total) by mouth daily. (Patient taking differently: Take 200-300 mg by mouth daily.) 10 capsule 0  . ferrous sulfate 325 (65 FE) MG tablet Take 325 mg by mouth daily with breakfast. (Patient not taking: Reported on 01/11/2020)    . furosemide (LASIX) 20 MG tablet Take 1 tablet (20 mg total) by mouth daily for 3 days. Then as needed for edema. 90 tablet 3  . gabapentin (NEURONTIN) 100 MG capsule Take 1 capsule (100 mg total) by mouth at bedtime. 30 capsule 3  . HYDROcodone-acetaminophen (NORCO) 10-325 MG tablet Take 1 tablet by mouth 3 (three) times daily as needed for moderate pain.     Marland Kitchen metoCLOPramide (REGLAN) 10 MG tablet Take 1 tablet (10 mg total) by mouth every 8 (eight) hours as needed for nausea (nausea/headache). (Patient not taking: Reported on 01/11/2020) 10 tablet 0  . Polyethyl Glycol-Propyl Glycol (SYSTANE OP) Place 1 drop into both eyes daily as needed (for dry eyes).    . polyethylene glycol powder  (GLYCOLAX/MIRALAX) powder Take 8.5 g by mouth daily. (Patient taking differently: Take 17 g by mouth daily as needed for moderate constipation.) 255 g 0  . zolpidem (AMBIEN) 5 MG tablet Take 5 mg by mouth at bedtime as needed.      Results for orders placed or performed during the hospital encounter of 02/27/20 (from the past 48 hour(s))  SARS CORONAVIRUS 2 (TAT 6-24 HRS) Nasopharyngeal Nasopharyngeal Swab     Status: None   Collection Time: 02/27/20 11:07 AM   Specimen: Nasopharyngeal Swab  Result Value Ref Range   SARS Coronavirus 2 NEGATIVE NEGATIVE    Comment: (NOTE) SARS-CoV-2 target nucleic acids are NOT DETECTED.  The SARS-CoV-2 RNA is generally detectable in upper and lower respiratory specimens during the acute phase of infection. Negative results do not preclude SARS-CoV-2  infection, do not rule out co-infections with other pathogens, and should not be used as the sole basis for treatment or other patient management decisions. Negative results must be combined with clinical observations, patient history, and epidemiological information. The expected result is Negative.  Fact Sheet for Patients: SugarRoll.be  Fact Sheet for Healthcare Providers: https://www.woods-mathews.com/  This test is not yet approved or cleared by the [place] FDA and  has been authorized for detection and/or diagnosis of SARS-CoV-2 by FDA under an Emergency Use Authorization (EUA). This EUA will remain  in effect (meaning this test can be used) for the duration of the COVID-19 declaration under Se ction 564(b)(1) of the Act, 21 U.S.C. section 360bbb-3(b)(1), unless the authorization is terminated or revoked sooner.  Performed at [facility], [place] [address] [place], [place] [address]    No results found.  Review of Systems  Blood pressure (!) 164/86, pulse 95, temperature 98.4 F (36.9 C), temperature source Oral, resp. rate 17, height 5'  5" (1.651 m), weight 56.7 kg, SpO2 95 %. Physical Exam HENT:     Mouth/Throat:     Mouth: Mucous membranes are moist.     Pharynx: Oropharynx is clear.     Comments: She has upper and lower dentures. Eyes:     General: No scleral icterus.    Conjunctiva/sclera: Conjunctivae normal.  Cardiovascular:     Rate and Rhythm: Normal rate and regular rhythm.     Heart sounds: Normal heart sounds. No murmur heard.   Pulmonary:     Effort: Pulmonary effort is normal.     Breath sounds: Normal breath sounds.  Abdominal:     General: There is no distension.     Palpations: Abdomen is soft. There is no mass.     Tenderness: There is no abdominal tenderness.  Musculoskeletal:        General: No swelling.     Cervical back: Neck supple.  Lymphadenopathy:     Cervical: No cervical adenopathy.  Skin:    General: Skin is warm and dry.  Neurological:     Mental Status: She is alert.      Assessment/Plan  Esophageal dysphagia. History of Schatzki's ring. Ago gastroduodenoscopy with esophageal dilation.  Hildred Laser, MD 02/28/2020, 3:16 PM

## 2020-02-28 NOTE — Anesthesia Preprocedure Evaluation (Signed)
Anesthesia Evaluation  Patient identified by MRN, date of birth, ID band Patient awake    Reviewed: Allergy & Precautions, H&P , NPO status , Patient's Chart, lab work & pertinent test results, reviewed documented beta blocker date and time   Airway Mallampati: II  TM Distance: >3 FB Neck ROM: full    Dental no notable dental hx. (+) Teeth Intact   Pulmonary asthma , former smoker,    Pulmonary exam normal breath sounds clear to auscultation       Cardiovascular Exercise Tolerance: Good hypertension,  Rhythm:regular Rate:Normal     Neuro/Psych  Headaches, PSYCHIATRIC DISORDERS Anxiety Depression    GI/Hepatic Neg liver ROS, hiatal hernia, GERD  Medicated,  Endo/Other  negative endocrine ROS  Renal/GU negative Renal ROS  negative genitourinary   Musculoskeletal   Abdominal   Peds  Hematology  (+) Blood dyscrasia, anemia ,   Anesthesia Other Findings   Reproductive/Obstetrics negative OB ROS                             Anesthesia Physical  Anesthesia Plan  ASA: III  Anesthesia Plan: General   Post-op Pain Management:    Induction:   PONV Risk Score and Plan: Propofol infusion  Airway Management Planned:   Additional Equipment:   Intra-op Plan:   Post-operative Plan:   Informed Consent: I have reviewed the patients History and Physical, chart, labs and discussed the procedure including the risks, benefits and alternatives for the proposed anesthesia with the patient or authorized representative who has indicated his/her understanding and acceptance.     Dental Advisory Given  Plan Discussed with: CRNA  Anesthesia Plan Comments:         Anesthesia Quick Evaluation

## 2020-02-28 NOTE — Transfer of Care (Signed)
Immediate Anesthesia Transfer of Care Note  Patient: Marilyn Haas  Procedure(s) Performed: ESOPHAGOGASTRODUODENOSCOPY (EGD) WITH PROPOFOL (N/A ) ESOPHAGEAL DILATION  Patient Location: PACU  Anesthesia Type:General  Level of Consciousness: sedated and patient cooperative  Airway & Oxygen Therapy: Patient Spontanous Breathing and Patient connected to nasal cannula oxygen  Post-op Assessment: Report given to RN and Post -op Vital signs reviewed and stable  Post vital signs: Reviewed and stable  Last Vitals:  Vitals Value Taken Time  BP    Temp    Pulse    Resp 15 02/28/20 1552  SpO2    Vitals shown include unvalidated device data. SEE VITAL FLOW SHEET Last Pain:  Vitals:   02/28/20 1529  TempSrc:   PainSc: 0-No pain         Complications: No complications documented.

## 2020-02-28 NOTE — Discharge Instructions (Signed)
No aspirin or NSAIDs for 3 days. Resume other medications as before. Mechanical soft diet for 48 hours. No driving for 24 hours. Office visit in 4 weeks.   ******Left message for office to call patient with a follow up appointment******      Upper Endoscopy, Adult, Care After This sheet gives you information about how to care for yourself after your procedure. Your health care provider may also give you more specific instructions. If you have problems or questions, contact your health care provider. What can I expect after the procedure? After the procedure, it is common to have:  A sore throat.  Mild stomach pain or discomfort.  Bloating.  Nausea. Follow these instructions at home:   Follow instructions from your health care provider about what to eat or drink after your procedure.  Return to your normal activities as told by your health care provider. Ask your health care provider what activities are safe for you.  Take over-the-counter and prescription medicines only as told by your health care provider.  Do not drive for 24 hours if you were given a sedative during your procedure.  Keep all follow-up visits as told by your health care provider. This is important. Contact a health care provider if you have:  A sore throat that lasts longer than one day.  Trouble swallowing. Get help right away if:  You vomit blood or your vomit looks like coffee grounds.  You have: ? A fever. ? Bloody, black, or tarry stools. ? A severe sore throat or you cannot swallow. ? Difficulty breathing. ? Severe pain in your chest or abdomen. Summary  After the procedure, it is common to have a sore throat, mild stomach discomfort, bloating, and nausea.  Do not drive for 24 hours if you were given a sedative during the procedure.  Follow instructions from your health care provider about what to eat or drink after your procedure.  Return to your normal activities as told by your  health care provider. This information is not intended to replace advice given to you by your health care provider. Make sure you discuss any questions you have with your health care provider. Document Revised: 08/24/2017 Document Reviewed: 08/02/2017 Elsevier Patient Education  [place] A soft-food eating plan includes foods that are safe and easy to chew and swallow. Your health care provider or dietitian can help you find foods and flavors that fit into this plan. Follow this plan until your health care provider or dietitian says it is safe to start eating other foods and food textures. What are tips for following this plan? General guidelines   Take small bites of food, or cut food into pieces about  inch or smaller. Bite-sized pieces of food are easier to chew and swallow.  Eat moist foods. Avoid overly dry foods.  Avoid foods that: ? Are difficult to swallow, such as dry, chunky, crispy, or sticky foods. ? Are difficult to chew, such as hard, tough, or stringy foods. ? Contain nuts, seeds, or fruits.  Follow instructions from your dietitian about the types of liquids that are safe for you to swallow. You may be allowed to have: ? Thick liquids only. This includes only liquids that are thicker than honey. ? Thin and thick liquids. This includes all beverages and foods that become liquid at room temperature.  To make thick liquids: ? Purchase a commercial liquid thickening powder. These are available at grocery stores and  pharmacies. ? Mix the thickener into liquids according to instructions on the label. ? Purchase ready-made thickened liquids. ? Thicken soup by pureeing, straining to remove chunks, and adding flour, potato flakes, or corn starch. ? Add commercial thickener to foods that become liquid at room temperature, such as milk shakes, yogurt, ice cream, gelatin, and sherbet.  Ask your health care provider whether you need to take a  fiber supplement. Cooking  Cook meats so they stay tender and moist. Use methods like braising, stewing, or baking in liquid.  Cook vegetables and fruit until they are soft enough to be mashed with a fork.  Peel soft, fresh fruits such as peaches, nectarines, and melons.  When making soup, make sure chunks of meat and vegetables are smaller than  inch.  Reheat leftover foods slowly so that a tough crust does not form. What foods are allowed? The items listed below may not be a complete list. Talk with your dietitian about what dietary choices are best for you. Grains Breads, muffins, pancakes, or waffles moistened with syrup, jelly, or butter. Dry cereals well-moistened with milk. Moist, cooked cereals. Well-cooked pasta and rice. Vegetables All soft-cooked vegetables. Shredded lettuce. Fruits All canned and cooked fruits. Soft, peeled fresh fruits. Strawberries. Dairy Milk. Cream. Yogurt. Cottage cheese. Soft cheese without the rind. Meats and other protein foods Tender, moist ground meat, poultry, or fish. Meat cooked in gravy or sauces. Eggs. Sweets and desserts Ice cream. Milk shakes. Sherbet. Pudding. Fats and oils Butter. Margarine. Olive, canola, sunflower, and grapeseed oil. Smooth salad dressing. Smooth cream cheese. Mayonnaise. Gravy. What foods are not allowed? The items listed bemay not be a complete list. Talk with your dietitian about what dietary choices are best for you. Grains Coarse or dry cereals, such as bran, granola, and shredded wheat. Tough or chewy crusty breads, such as [place] bread or baguettes. Breads with nuts, seeds, or fruit. Vegetables All raw vegetables. Cooked corn. Cooked vegetables that are tough or stringy. Tough, crisp, fried potatoes and potato skins. Fruits Fresh fruits with skins or seeds, or both, such as apples, pears, and grapes. Stringy, high-pulp fruits, such as papaya, pineapple, coconut, and mango. Fruit leather and all dried  fruit. Dairy Yogurt with nuts or coconut. Meats and other protein foods Hard, dry sausages. Dry meat, poultry, or fish. Meats with gristle. Fish with bones. Fried meat or fish. Lunch meat and hotdogs. Nuts and seeds. Chunky peanut butter or other nut butters. Sweets and desserts Cakes or cookies that are very dry or chewy. Desserts with dried fruit, nuts, or coconut. Fried pastries. Very rich pastries. Fats and oils Cream cheese with fruit or nuts. Salad dressings with seeds or chunks. Summary  A soft-food eating plan includes foods that are safe and easy to swallow. Generally, the foods should be soft enough to be mashed with a fork.  Avoid foods that are dry, hard to chew, crunchy, sticky, stringy, or crispy.  Ask your health care provider whether you need to thicken your liquids and if you need to take a fiber supplement. This information is not intended to replace advice given to you by your health care provider. Make sure you discuss any questions you have with your health care provider. Document Revised: 06/23/2018 Document Reviewed: 05/05/2016 Elsevier Patient Education  2020 [place] After These instructions provide you with information about caring for yourself after your procedure. Your health care provider may also give you more specific instructions.  Your treatment has been planned according to current medical practices, but problems sometimes occur. Call your health care provider if you have any problems or questions after your procedure. What can I expect after the procedure? After your procedure, you may:  Feel sleepy for several hours.  Feel clumsy and have poor balance for several hours.  Feel forgetful about what happened after the procedure.  Have poor judgment for several hours.  Feel nauseous or vomit.  Have a sore throat if you had a breathing tube during the procedure. Follow these instructions at home: For at  least 24 hours after the procedure:      Have a responsible adult stay with you. It is important to have someone help care for you until you are awake and alert.  Rest as needed.  Do not: ? Participate in activities in which you could fall or become injured. ? Drive. ? Use heavy machinery. ? Drink alcohol. ? Take sleeping pills or medicines that cause drowsiness. ? Make important decisions or sign legal documents. ? Take care of children on your own. Eating and drinking  Follow the diet that is recommended by your health care provider.  If you vomit, drink water, juice, or soup when you can drink without vomiting.  Make sure you have little or no nausea before eating solid foods. General instructions  Take over-the-counter and prescription medicines only as told by your health care provider.  If you have sleep apnea, surgery and certain medicines can increase your risk for breathing problems. Follow instructions from your health care provider about wearing your sleep device: ? Anytime you are sleeping, including during daytime naps. ? While taking prescription pain medicines, sleeping medicines, or medicines that make you drowsy.  If you smoke, do not smoke without supervision.  Keep all follow-up visits as told by your health care provider. This is important. Contact a health care provider if:  You keep feeling nauseous or you keep vomiting.  You feel light-headed.  You develop a rash.  You have a fever. Get help right away if:  You have trouble breathing. Summary  For several hours after your procedure, you may feel sleepy and have poor judgment.  Have a responsible adult stay with you for at least 24 hours or until you are awake and alert. This information is not intended to replace advice given to you by your health care provider. Make sure you discuss any questions you have with your health care provider. Document Revised: 05/31/2017 Document Reviewed:  06/23/2015 Elsevier Patient Education  [place].

## 2020-02-28 NOTE — Op Note (Signed)
[facility] Patient Name: Marilyn Haas Procedure Date: 02/28/2020 3:09 PM MRN: [id number] Date of Birth: 08/28/1940 Attending MD: Hildred Laser , MD CSN: [id number] Age: 79 Admit Type: Outpatient Procedure:                Upper GI endoscopy Indications:              Esophageal dysphagia Providers:                Hildred Laser, MD, Caprice Kluver, [place] Page Referring MD:             Redmond School, MD Medicines:                Propofol per Anesthesia Complications:            No immediate complications. Estimated Blood Loss:     Estimated blood loss: 20 mL. No intervention needed. Procedure:                Pre-Anesthesia Assessment:                           - Prior to the procedure, a History and Physical                            was performed, and patient medications and                            allergies were reviewed. The patient's tolerance of                            previous anesthesia was also reviewed. The risks                            and benefits of the procedure and the sedation                            options and risks were discussed with the patient.                            All questions were answered, and informed consent                            was obtained. Prior Anticoagulants: The patient has                            taken no previous anticoagulant or antiplatelet                            agents. ASA Grade Assessment: III - A patient with                            severe systemic disease. After reviewing the risks                            and benefits, the patient was deemed in  satisfactory condition to undergo the procedure.                           After obtaining informed consent, the endoscope was                            passed under direct vision. Throughout the                            procedure, the patient's blood pressure, pulse, and                            oxygen saturations were  monitored continuously. The                            GIF-H190 ([id number]) scope was introduced through the                            mouth, and advanced to the second part of duodenum.                            The upper GI endoscopy was accomplished without                            difficulty. The patient tolerated the procedure                            well. Scope In: 3:35:20 PM Scope Out: 3:48:44 PM Total Procedure Duration: 0 hours 13 minutes 24 seconds  Findings:      The hypopharynx was normal.      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       Dilation attempted with 54 and 50 dilators but was not successful.       Therefore 50 French Maloney dilator passed partially. The dilation site       was examined following endoscope reinsertion and showed moderate mucosal       disruption and no perforation.      A moderate Schatzki ring was found at the gastroesophageal junction.       Schatzki's ring was dilated with balloon dilator from 15 to 18 mm. TThe       dilation site was examined and showed mild mucosal disruption and no       perforation.      A 9 cm hiatal hernia was present.      The entire examined stomach was normal.      The duodenal bulb and second portion of the duodenum were normal. Impression:               - Normal hypopharynx.                           - Web in the proximal esophagus. Dilated with                            [facility] dilator                           -  Moderate Schatzki ring. Dilated with the balloon                            later.                           - 9 cm hiatal hernia.                           - Normal stomach.                           - Normal duodenal bulb and second portion of the                            duodenum.                           - No specimens collected. Moderate Sedation:      Per Anesthesia Care Recommendation:           - Patient has a contact number available for                             emergencies. The signs and symptoms of potential                            delayed complications were discussed with the                            patient. Return to normal activities tomorrow.                            Written discharge instructions were provided to the                            patient.                           - Mechanical soft diet today.                           - Continue present medications.                           - No aspirin, ibuprofen, naproxen, or other                            non-steroidal anti-inflammatory drugs for 3 days.                           - Return to GI clinic in 4 weeks. Procedure Code(s):        --- Professional ---                           [id number], Esophagogastroduodenoscopy, flexible,  transoral; diagnostic, including collection of                            specimen(s) by brushing or washing, when performed                            (separate procedure) Diagnosis Code(s):        --- Professional ---                           Q39.4, Esophageal web                           K22.2, Esophageal obstruction                           K44.9, Diaphragmatic hernia without obstruction or                            gangrene                           R13.14, Dysphagia, pharyngoesophageal phase CPT copyright 2019 American Medical Association. All rights reserved. The codes documented in this report are preliminary and upon coder review may  be revised to meet current compliance requirements. Hildred Laser, MD Hildred Laser, MD 02/28/2020 4:01:02 PM This report has been signed electronically. Number of Addenda: 0

## 2020-02-28 NOTE — Anesthesia Procedure Notes (Addendum)
Date/Time: 02/28/2020 3:26 PM Performed by: Vista Deck, CRNA Pre-anesthesia Checklist: Patient identified, Emergency Drugs available, Suction available, Timeout performed and Patient being monitored Patient Re-evaluated:Patient Re-evaluated prior to induction Oxygen Delivery Method: Nasal Cannula

## 2020-03-04 DIAGNOSIS — M150 Primary generalized (osteo)arthritis: Secondary | ICD-10-CM | POA: Diagnosis not present

## 2020-03-04 DIAGNOSIS — T8484XA Pain due to internal orthopedic prosthetic devices, implants and grafts, initial encounter: Secondary | ICD-10-CM | POA: Diagnosis not present

## 2020-03-04 DIAGNOSIS — G894 Chronic pain syndrome: Secondary | ICD-10-CM | POA: Diagnosis not present

## 2020-03-06 ENCOUNTER — Encounter (HOSPITAL_COMMUNITY): Payer: Self-pay | Admitting: Internal Medicine

## 2020-03-07 ENCOUNTER — Other Ambulatory Visit: Payer: Self-pay

## 2020-03-07 ENCOUNTER — Inpatient Hospital Stay: Payer: Medicare Other | Attending: Adult Health | Admitting: Oncology

## 2020-03-07 VITALS — BP 170/80 | HR 106 | Resp 18 | Ht 65.0 in | Wt 133.9 lb

## 2020-03-07 DIAGNOSIS — Z853 Personal history of malignant neoplasm of breast: Secondary | ICD-10-CM

## 2020-03-07 DIAGNOSIS — Z23 Encounter for immunization: Secondary | ICD-10-CM | POA: Diagnosis not present

## 2020-03-07 DIAGNOSIS — C50411 Malignant neoplasm of upper-outer quadrant of right female breast: Secondary | ICD-10-CM

## 2020-03-07 DIAGNOSIS — Z170 Estrogen receptor positive status [ER+]: Secondary | ICD-10-CM

## 2020-03-07 MED ORDER — INFLUENZA VAC A&B SA ADJ QUAD 0.5 ML IM PRSY
0.5000 mL | PREFILLED_SYRINGE | Freq: Once | INTRAMUSCULAR | Status: AC
  Administered 2020-03-07: 0.5 mL via INTRAMUSCULAR

## 2020-03-07 MED ORDER — INFLUENZA VAC A&B SA ADJ QUAD 0.5 ML IM PRSY
PREFILLED_SYRINGE | INTRAMUSCULAR | Status: AC
  Filled 2020-03-07: qty 0.5

## 2020-03-07 NOTE — Progress Notes (Signed)
[facility]  Telephone:([phone] Fax:([phone]) [phone]     ID: Marilyn Haas DOB: [birth year]  MR#: [id number]  EXB#:[id number]  Patient Care Team: Elfredia Nevins, MD as PCP - General (Internal Medicine) Jonelle Sidle, MD as PCP - Cardiology (Cardiology) Jadelynn Boylan, Valentino Hue, MD as Consulting Physician (Oncology) Claud Kelp, MD as Consulting Physician (General Surgery) Dorothy Puffer, MD as Consulting Physician (Radiation Oncology) Malissa Hippo, MD as Consulting Physician (Gastroenterology) Ollen Gross, MD as Consulting Physician (Orthopedic Surgery) Axel Filler, Larna Daughters, NP as Nurse Practitioner (Hematology and Oncology) Neysa Hotter, MD as Consulting Physician (Psychiatry) Tilda Burrow, MD (Inactive) as Consulting Physician (Obstetrics and Gynecology) OTHER MD:  CHIEF COMPLAINT: Estrogen receptor positive lobular breast cancer  CURRENT TREATMENT: tamoxifen   INTERVAL HISTORY: Marilyn Haas returns today for follow-up of her estrogen receptor positive breast cancer. She was last seen here in 09/2018.  She continues on Tamoxifen with good tolerance. She reports occasional hot flashes, but they are not an issue. She reports vaginal wetness and wears a pad.  Since her last visit, she presented to the ED with emesis on 12/05/2018. She was placed on Reglan and discharged. Covid-19 test performed at that time was negative.  She has not undergone any additional studies and specifically has not had a mammogram this year   REVIEW OF SYSTEMS: Marilyn Haas tells me she has been depressed since her husband's death now about 3 years ago.  She lives by herself, with no pets.  She is looking forward to the holidays but there are complex.  She has a son in [place] and another in [place] and trouble these days is difficult.  She is not quite sure how the holidays will work out.   COVID 19 VACCINATION STATUS: .  She had Moderna x2+ the booster.  She received the  flu vaccine in our clinic today (03/07/2020)    BREAST CANCER HISTORY: From the original intake note:  The patient had bilateral screening mammography at the [facility] 04/22/2016 showing an area of possible asymmetry and calcifications in the right breast. Right diagnostic mammography with tomography and right breast ultrasonography 05/12/2016 showed the breast density to be category C. In the right breast upper outer quadrant there was a. A real lower area of architectural distortion which was palpable in the 11:00 radians 1 cm from the nipple. Ultrasonography confirmed an irregular hypoechoic mass at this location measuring 1.4 cm. Ultrasound of the right axilla was unremarkable.  Biopsy of the right breast mass in question 05/19/2016 showed ([id number]) and invasive lobular carcinoma, E-cadherin negative, estrogen receptor 90% positive, progesterone receptor 15% positive, both with strong staining intensity, with an MIB-1 of 5%, and no HER-2 amplification, the signals ratio being 1.51 and the number per cell 3.10.  Breast MRI 06/08/2016 confirmed an irregular enhancing mass in the upper-outer quadrant of the right breast measuring 1.5 cm, with adjacent biopsy clip artifact. There was an area of contiguous non-mass like enhancement bringing the total dimension to 1.9 cm. However there was no evidence of multicentric disease and no evidence of metastatic lymphadenopathy.  The patient's subsequent history is as detailed below   PAST MEDICAL HISTORY: Past Medical History:  Diagnosis Date  . Anemia   . Anxiety   . Arthritis   . Breast cancer (HCC) 05/19/2016   Right breast  . Childhood asthma   . Chronic back pain    Scoliosis, stenosis  . Depression   . Diverticulosis   . GERD (gastroesophageal reflux disease)   .  Hard of hearing   . Headache   . History of blood transfusion   . History of colon polyps   . History of hiatal hernia   . History of pneumonia   . Insomnia   .  Osteopenia   . Osteoporosis   . Personal history of radiation therapy   . Thickened endometrium 09/29/2017    PAST SURGICAL HISTORY: Past Surgical History:  Procedure Laterality Date  . BREAST LUMPECTOMY WITH RADIOACTIVE SEED AND SENTINEL LYMPH NODE BIOPSY Right 07/31/2016   Procedure: BREAST LUMPECTOMY WITH RADIOACTIVE SEED AND SENTINEL LYMPH NODE BIOPSY, INJECT BLUE DYE RIGHT BREAST;  Surgeon: Claud Kelp, MD;  Location: [facility] OR;  Service: General;  Laterality: Right;  . COLONOSCOPY    . 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 07/04/2014   Procedure: LEFT CONVERSION TO TOTAL HIP ARTHROPLASTY;  Surgeon: Ollen Gross, MD;  Location: [facility] ORS;  Service: Orthopedics;  Laterality: Left;  . ESOPHAGEAL DILATION N/A 09/24/2017   Procedure: ESOPHAGEAL DILATION;  Surgeon: Malissa Hippo, MD;  Location: [facility] END[facility] SUITE;  Service: Endoscopy;  Laterality: N/A;  . ESOPHAGEAL DILATION N/A 01/19/2020   Procedure: ESOPHAGEAL DILATION;  Surgeon: Dolores Frame, MD;  Location: [facility] SU[facility];  Service: Gastroenterology;  Laterality: N/A;  . ESOPHAGEAL DILATION  02/28/2020   Procedure: ESOPHAGEAL DILATION;  Surgeon: Malissa Hippo, MD;  Location: [facility];  Service: Endoscopy;;  . ESOPHAGOGASTRODUODENOSCOPY N/A 01/26/2014   Procedure: ESOPHAGOGASTRODUODENOSCOPY (EGD);  Surgeon: Malissa Hippo, MD;  Location: [facility];  Service: Endoscopy;  Laterality: N/A;  1030  . ESOPHAGOGASTRODUODENOSCOPY (EGD) WITH PROPOFOL N/A 09/24/2017   Procedure: ESOPHAGOGASTRODUODENOSCOPY (EGD) WITH PROPOFOL;  Surgeon: Malissa Hippo, MD;  [facility];  Service: Endoscopy;  Laterality: N/A;  11:15  . ESOPHAGOGASTRODUODENOSCOPY (EGD) WITH PROPOFOL N/A 01/19/2020   Procedure: ESOPHAGOGASTRODUODENOSCOPY (EGD) WITH PROPOFOL;  Surgeon: Dolores Frame, MD;  [facility];  Service: Gastroenterology;  Laterality: N/A;  1200  . ESOPHAGOGASTRODUODENOSCOPY (EGD) WITH PROPOFOL N/A 02/28/2020    Procedure: ESOPHAGOGASTRODUODENOSCOPY (EGD) WITH PROPOFOL;  Surgeon: Malissa Hippo, MD;  [facility];  Service: Endoscopy;  Laterality: N/A;  2:45  . HIP FRACTURE SURGERY     05/2009 left -Turner Daniels  . HYSTEROSCOPY WITH D & C  05/24/2018   Procedure: DILATATION AND CURETTAGE /HYSTEROSCOPY (PROCEDURE # 2)/PAP SMEAR (PROCEDURE #1);  Surgeon: Tilda Burrow, MD;  Location: [facility] ORS;  Service: Gynecology;;  . [name] DILATION N/A 01/26/2014   Procedure: Alvy Beal;  Surgeon: Malissa Hippo, MD;  [facility];  Service: Endoscopy;  Laterality: N/A;    FAMILY HISTORY Family History  Problem Relation Age of Onset  . Heart failure Mother   . Diabetes Mother   . Hypertension Mother   . Depression Mother   . Other Father        stomach ulcers; abd aneursym  . Hypertension Sister   . Heart failure Sister   . Diabetes Sister   . Depression Sister   . Diabetes Sister        prediabetes  . Cancer Maternal Aunt        breast  The patient's father died at age 65 from rupture of an abdominal aortic aneurysm. The patient's mother died with heart failure in the setting of diabetes at age 103. The patient had no brothers, 2 sisters. One sister was diagnosed with breast cancer at age 17. Her other sister has had complications from diabetes. One maternal aunt was  diagnosed with breast cancer in her 56s   GYNECOLOGIC HISTORY:  No LMP recorded. Patient is postmenopausal. Menarche age 22, first live birth age 51, which the patient is aware increases the risk of breast cancer. She is GX P3. She does not quite recall when she went through the change of life but thinks he was in her 26s. She did not use hormone replacement.   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 Her husband "Leavy Cella" Erlandson, 83, was in Airline pilot. He passed away in 08/14/2018due to cancer and various other medical problems. Son Trinna Post lives in [place] where he works in Armed forces training and education officer. Son Willaim Sheng  lives in [place] and works in Magazine features editor. Son Cletis Athens lives in [place] and works as a Teaching laboratory technician. The patient has 12 grandchildren and 2 great-grandchildren. She is a Control and instrumentation engineer.    ADVANCED DIRECTIVES: Not addressed at 06/22/2016 meeting   HEALTH MAINTENANCE: Social History   Tobacco Use  . Smoking status: Former Smoker    Packs/day: 0.50    Years: 2.00    Pack years: 1.00    Types: Cigarettes    Quit date: 01/15/1961    Years since quitting: 59.1  . Smokeless tobacco: Never Used  Vaping Use  . Vaping Use: Never used  Substance Use Topics  . Alcohol use: Yes    Alcohol/week: 0.0 standard drinks    Comment: occasionally  . Drug use: No     Colonoscopy: UTD/Rehman  PAP: 05/24/2018, normal  Bone density: 04/22/2016   Allergies  Allergen Reactions  . Avelox [Moxifloxacin Hcl In Nacl] Other (See Comments)    Altered mental status    Current Outpatient Medications  Medication Sig Dispense Refill  . ALPRAZolam (XANAX) 0.25 MG tablet Take 0.25 mg by mouth at bedtime.    . ARIPiprazole (ABILIFY) 2 MG tablet Take 2 mg by mouth daily.    . cyanocobalamin (,VITAMIN B-12,) 1000 MCG/ML injection Inject 1,000 mcg into the muscle every 30 (thirty) days. (Patient not taking: No sig reported)    . docusate sodium (COLACE) 100 MG capsule Take 2 capsules (200 mg total) by mouth daily. (Patient taking differently: Take 200-300 mg by mouth daily.) 10 capsule 0  . DULoxetine (CYMBALTA) 60 MG capsule Take 120 mg by mouth daily.    . furosemide (LASIX) 20 MG tablet Take 1 tablet (20 mg total) by mouth daily for 3 days. Then as needed for edema. 90 tablet 3  . gabapentin (NEURONTIN) 100 MG capsule Take 1 capsule (100 mg total) by mouth at bedtime. 30 capsule 3  . HYDROcodone-acetaminophen (NORCO) 10-325 MG tablet Take 1 tablet by mouth 3 (three) times daily as needed for moderate pain.     . mirtazapine (REMERON) 15 MG tablet Take 1 tablet (15 mg total) by mouth at bedtime. 90  tablet 0  . omeprazole (PRILOSEC) 40 MG capsule Take 1 capsule (40 mg total) by mouth in the morning and at bedtime. 60 capsule 3  . Polyethyl Glycol-Propyl Glycol (SYSTANE OP) Place 1 drop into both eyes daily as needed (for dry eyes).    . polyethylene glycol powder (GLYCOLAX/MIRALAX) powder Take 8.5 g by mouth daily. (Patient taking differently: Take 17 g by mouth daily as needed for moderate constipation.) 255 g 0  . sucralfate (CARAFATE) 1 g tablet Take 1 tablet (1 g total) by mouth 4 (four) times daily. 30 tablet 0  . tamoxifen (NOLVADEX) 20 MG tablet TAKE ONE TABLET (20MG  TOTAL) BY MOUTH  DAILY 30 tablet 0  . zolpidem (AMBIEN) 5 MG tablet Take 5 mg by mouth at bedtime as needed.     No current facility-administered medications for this visit.    OBJECTIVE: White woman who appears stated age  Vitals:   03/07/20 1503  BP: (!) 170/80  Pulse: (!) 106  Resp: 18  SpO2: 94%     Body mass index is 22.28 kg/m.    ECOG FS:1 - Symptomatic but completely ambulatory  Sclerae unicteric, EOMs intact Wearing a mask No cervical or supraclavicular adenopathy Lungs no rales or rhonchi Heart regular rate and rhythm Abd soft, nontender, positive bowel sounds MSK no focal spinal tenderness, no upper extremity lymphedema Neuro: nonfocal, well oriented, appropriate affect Breasts: The right breast is status post lumpectomy and radiation.  There is no evidence of local recurrence.  The left breast is benign.  Both axillae are benign.   LAB RESULTS:  CMP     Component Value Date/Time   NA 132 (L) 01/17/2020 1004   NA 139 12/21/2016 0931   K 4.1 01/17/2020 1004   K 4.2 12/21/2016 0931   CL 95 (L) 01/17/2020 1004   CO2 25 01/17/2020 1004   CO2 28 12/21/2016 0931   GLUCOSE 87 01/17/2020 1004   GLUCOSE 86 12/21/2016 0931   BUN 11 01/17/2020 1004   BUN 14.1 12/21/2016 0931   CREATININE 0.92 01/17/2020 1004   CREATININE 0.9 12/21/2016 0931   CALCIUM 9.1 01/17/2020 1004   CALCIUM 8.8  12/21/2016 0931   PROT 8.2 (H) 12/05/2018 0242   PROT 7.0 12/21/2016 0931   ALBUMIN 4.4 12/05/2018 0242   ALBUMIN 3.7 12/21/2016 0931   AST 40 12/05/2018 0242   AST 15 12/21/2016 0931   ALT 17 12/05/2018 0242   ALT 8 12/21/2016 0931   ALKPHOS 95 12/05/2018 0242   ALKPHOS 62 12/21/2016 0931   BILITOT 1.2 12/05/2018 0242   BILITOT 0.40 12/21/2016 0931   GFRNONAA >60 01/17/2020 1004   GFRAA >60 12/05/2018 0242    No results found for: Dorene Ar, A1GS, A2GS, BETS, BETA2SER, GAMS, MSPIKE, SPEI  No results found for: Ron Parker, [facility]  Lab Results  Component Value Date   WBC 5.5 01/17/2020   NEUTROABS 3.0 01/17/2020   HGB 12.4 01/17/2020   HCT 38.1 01/17/2020   MCV 100.5 (H) 01/17/2020   PLT 321 01/17/2020      Chemistry      Component Value Date/Time   NA 132 (L) 01/17/2020 1004   NA 139 12/21/2016 0931   K 4.1 01/17/2020 1004   K 4.2 12/21/2016 0931   CL 95 (L) 01/17/2020 1004   CO2 25 01/17/2020 1004   CO2 28 12/21/2016 0931   BUN 11 01/17/2020 1004   BUN 14.1 12/21/2016 0931   CREATININE 0.92 01/17/2020 1004   CREATININE 0.9 12/21/2016 0931      Component Value Date/Time   CALCIUM 9.1 01/17/2020 1004   CALCIUM 8.8 12/21/2016 0931   ALKPHOS 95 12/05/2018 0242   ALKPHOS 62 12/21/2016 0931   AST 40 12/05/2018 0242   AST 15 12/21/2016 0931   ALT 17 12/05/2018 0242   ALT 8 12/21/2016 0931   BILITOT 1.2 12/05/2018 0242   BILITOT 0.40 12/21/2016 0931       No results found for: LABCA2  No components found for: [id number]  No results for input(s): INR in the last 168 hours.  Urinalysis    Component Value Date/Time   COLORURINE YELLOW 05/17/2018 1314  APPEARANCEUR HAZY (A) 05/17/2018 1314   LABSPEC 1.017 05/17/2018 1314   PHURINE 5.0 05/17/2018 1314   GLUCOSEU NEGATIVE 05/17/2018 1314   HGBUR SMALL (A) 05/17/2018 1314   BILIRUBINUR NEGATIVE 05/17/2018 1314   KETONESUR NEGATIVE 05/17/2018 1314   PROTEINUR NEGATIVE  05/17/2018 1314   UROBILINOGEN 1.0 06/26/2014 1400   NITRITE NEGATIVE 05/17/2018 1314   LEUKOCYTESUR NEGATIVE 05/17/2018 1314    STUDIES: No results found.   ELIGIBLE FOR AVAILABLE RESEARCH PROTOCOL: no  ASSESSMENT: 79 y.o. Gallant woman status post right breast upper outer quadrant biopsy 05/19/2016 for a clinical T1c N0, stage IA invasive lobular breast cancer, estrogen and progesterone receptor positive, HER-2 nonamplified, with an MIB-15%.   (1) Status post right lumpectomy and sentinel lymph node sampling 07/31/2016 for a pT2 pN0, stage IB invasive lobular carcinoma, grade 2, with negative margins  (2) adjuvant radiation  09/01/2016 to 09/29/2016 Site/dose:    1. The Right breast was treated to 42.5 Gy in 17 fractions at 2.5 Gy per fraction. 2. The Right breast was boosted to 7.5 Gy in 3 fractions at 2.5 Gy per fraction.   (3)  started tamoxifen 10/26/2016  (a) bone density 04/22/2016 shows a T score in the right femur of -2.0, right radius -2.4   (b) switched to anastrozole August 2019 because of concerns for vaginal wetness and hot flashes with tamoxifen  (c) switched back to tamoxifen June 2019 as the patient felt there was no improvement in prior issues and she would like to "strengthen her bones".   PLAN:  Marilyn Haas is now 3-1/2 years out from definitive surgery for her breast cancer with no evidence of disease recurrence.  This is very favorable.  She is behind on mammography and have put her in for mammography next week or so if they can fit her in.  We are continuing tamoxifen to a total of 5 years which will take [place] to August 2023.  She will see me again in October of next year.  We went ahead and gave her the flu shot today  She knows to call for any other issue that may develop before the next visit.  Total encounter time 25 minutes.*  Cheron Coryell, Valentino Hue, MD  03/07/20 6:01 PM Medical Oncology and Hematology [facility] [address]  [place], [place] [address] Tel. [phone]    Fax. [phone]   I, Mickie Bail, am acting as scribe for Dr. Valentino Hue. Michaeleen Down.  I, Ruthann Cancer MD, have reviewed the above documentation for accuracy and completeness, and I agree with the above.   *Total Encounter Time as defined by the Centers for Medicare and Medicaid Services includes, in addition to the face-to-face time of a patient visit (documented in the note above) non-face-to-face time: obtaining and reviewing outside history, ordering and reviewing medications, tests or procedures, care coordination (communications with other health care professionals or caregivers) and documentation in the medical record.

## 2020-03-11 DIAGNOSIS — Z23 Encounter for immunization: Secondary | ICD-10-CM | POA: Diagnosis not present

## 2020-03-13 ENCOUNTER — Telehealth: Payer: Self-pay | Admitting: Physician Assistant

## 2020-03-13 NOTE — Telephone Encounter (Signed)
Scheduled appts per 12/23 los. Unable to leave voicemail. Mailed appt reminder and calendar.  

## 2020-03-22 ENCOUNTER — Other Ambulatory Visit: Payer: Self-pay | Admitting: *Deleted

## 2020-03-22 MED ORDER — TAMOXIFEN CITRATE 20 MG PO TABS: ORAL_TABLET | ORAL | 3 refills | Status: DC

## 2020-03-28 ENCOUNTER — Telehealth (INDEPENDENT_AMBULATORY_CARE_PROVIDER_SITE_OTHER): Payer: Medicare Other | Admitting: Internal Medicine

## 2020-03-28 ENCOUNTER — Other Ambulatory Visit: Payer: Self-pay

## 2020-03-28 DIAGNOSIS — K219 Gastro-esophageal reflux disease without esophagitis: Secondary | ICD-10-CM | POA: Diagnosis not present

## 2020-03-28 DIAGNOSIS — K5909 Other constipation: Secondary | ICD-10-CM

## 2020-03-28 DIAGNOSIS — R1310 Dysphagia, unspecified: Secondary | ICD-10-CM | POA: Diagnosis not present

## 2020-03-28 MED ORDER — LINACLOTIDE 145 MCG PO CAPS: 145.0000 ug | ORAL_CAPSULE | Freq: Every day | ORAL | 5 refills | Status: DC

## 2020-03-28 MED ORDER — METAMUCIL SMOOTH TEXTURE 58.6 % PO POWD: 1.0000 | Freq: Every day | ORAL | 12 refills | Status: DC

## 2020-03-28 MED ORDER — OMEPRAZOLE 20 MG PO CPDR: 20.0000 mg | DELAYED_RELEASE_CAPSULE | Freq: Two times a day (BID) | ORAL | 5 refills | Status: DC

## 2020-03-28 NOTE — Progress Notes (Signed)
Virtual Visit via Telephone Note  I connected with [name] on 03/28/20 at  3:13 PM EST by telephone and verified that I am speaking with the correct person using two identifiers.  Location: Patient: home Provider: office   I discussed the limitations, risks, security and privacy concerns of performing an evaluation and management service by telephone and the availability of in person appointments. I also discussed with the patient that there may be a patient responsible charge related to this service. The patient expressed understanding and agreed to proceed.  History of Present Illness:  Telephone visit was completed with help of [name]. Patient states she has had no swallowing difficulty whatsoever.  She can swallow big and small pills without any difficulty.  She is also not having any difficulty with solids or liquids.  She also has not having any heartburn while on 80 mg of omeprazole daily.  She is complaining of constipation.  She is having to take OTC laxatives.  Bowels are not moving with Colace and polyethylene glycol which she has been taking every day.  She has chronic back pain.  She is taking pain pill half a pill 3 times a day. Medication list was reviewed with help of [name].  EGD findings from 02/28/2020 study reviewed with patient's son believe patient had prominent esophageal web which was dilated disrupted with 33 [place] Maloney dilator.  Largest size could not be passed.  Schatzki's ring was dilated with 18 mm balloon dilator.    Current Outpatient Medications:  .  ALPRAZolam (XANAX) 0.25 MG tablet, Take 0.25 mg by mouth at bedtime., Disp: , Rfl:  .  ARIPiprazole (ABILIFY) 2 MG tablet, Take 2 mg by mouth daily., Disp: , Rfl:  .  cyanocobalamin (,VITAMIN B-12,) 1000 MCG/ML injection, Inject 1,000 mcg into the muscle every 30 (thirty) days., Disp: , Rfl:  .  docusate sodium (COLACE) 100 MG capsule, Take 2 capsules (200 mg total) by mouth daily.  (Patient taking differently: Take 200-300 mg by mouth daily.), Disp: 10 capsule, Rfl: 0 .  DULoxetine (CYMBALTA) 60 MG capsule, Take 120 mg by mouth daily., Disp: , Rfl:  .  gabapentin (NEURONTIN) 100 MG capsule, Take 1 capsule (100 mg total) by mouth at bedtime., Disp: 30 capsule, Rfl: 3 .  HYDROcodone-acetaminophen (NORCO) 10-325 MG tablet, Take 1 tablet by mouth 3 (three) times daily as needed for moderate pain. , Disp: , Rfl:  .  mirtazapine (REMERON) 15 MG tablet, Take 1 tablet (15 mg total) by mouth at bedtime., Disp: 90 tablet, Rfl: 0 .  omeprazole (PRILOSEC) 40 MG capsule, Take 1 capsule (40 mg total) by mouth in the morning and at bedtime., Disp: 60 capsule, Rfl: 3 .  Polyethyl Glycol-Propyl Glycol (SYSTANE OP), Place 1 drop into both eyes daily as needed (for dry eyes)., Disp: , Rfl:  .  polyethylene glycol powder (GLYCOLAX/MIRALAX) powder, Take 8.5 g by mouth daily. (Patient taking differently: Take 17 g by mouth daily as needed for moderate constipation.), Disp: 255 g, Rfl: 0 .  sucralfate (CARAFATE) 1 g tablet, Take 1 tablet (1 g total) by mouth 4 (four) times daily., Disp: 30 tablet, Rfl: 0 .  tamoxifen (NOLVADEX) 20 MG tablet, TAKE ONE TABLET (20MG  TOTAL) BY MOUTH DAILY, Disp: 30 tablet, Rfl: 3 .  zolpidem (AMBIEN) 5 MG tablet, Take 5 mg by mouth at bedtime as needed., Disp: , Rfl:  .  furosemide (LASIX) 20 MG tablet, Take 1 tablet (20 mg total) by  mouth daily for 3 days. Then as needed for edema., Disp: 90 tablet, Rfl: 3  Observations/Objective:  Patient reported her weight to be 133 pounds.   Assessment and Plan:  #1.  Chronic GERD.  She is doing well with therapy.  She has large hiatal hernia.  I believe she is ready for PPI dose reduction.  If she has heartburn with lower dose she will call office. New prescription for omeprazole 20 mg p.o. twice daily sent to patient's pharmacy for 1 month with 5 refills.  #2.  Esophageal dysphagia.  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  Valve was disrupted with 46 [place] Maloney dilator and ring was disrupted with 18 mm balloon dilator.  She had a satisfactory response and she does not have pill or food dysphagia.  #2.  Constipation.  Constipation appears to be multifactorial but primarily due to narcotics and other medications.  Polyethylene glycol is not working anymore.  I would hold off using Relistor or similar medications for fear of inducing withdrawal symptoms. Patient advised to take Metamucil 1 packet which is 3-1/2 g or 1 heaping tablespoonful daily at bedtime for Discontinue polyethylene glycol and Colace. Begin Linzess/linaclotide 145 mcg by mouth every morning.  Medication list was updated.  Sucralfate, Colace and polyethylene glycol deleted  Follow Up Instructions:  Patient will call office if omeprazole 20 mg p.o. twice daily does not control her heartburn. Patient will call if Linzess does not work. Office visit in 6 months.  I discussed the assessment and treatment plan with the patient. The patient was provided an opportunity to ask questions and all were answered. The patient agreed with the plan and demonstrated an understanding of the instructions.   The patient was advised to call back or seek an in-person evaluation if the symptoms worsen or if the condition fails to improve as anticipated.  I provided 15 minutes of non-face-to-face time during this encounter.   Hildred Laser, MD

## 2020-04-04 DIAGNOSIS — H6121 Impacted cerumen, right ear: Secondary | ICD-10-CM | POA: Diagnosis not present

## 2020-04-04 DIAGNOSIS — H838X3 Other specified diseases of inner ear, bilateral: Secondary | ICD-10-CM | POA: Diagnosis not present

## 2020-04-04 DIAGNOSIS — H903 Sensorineural hearing loss, bilateral: Secondary | ICD-10-CM | POA: Diagnosis not present

## 2020-04-08 DIAGNOSIS — F419 Anxiety disorder, unspecified: Secondary | ICD-10-CM | POA: Diagnosis not present

## 2020-04-08 DIAGNOSIS — G894 Chronic pain syndrome: Secondary | ICD-10-CM | POA: Diagnosis not present

## 2020-04-08 DIAGNOSIS — I10 Essential (primary) hypertension: Secondary | ICD-10-CM | POA: Diagnosis not present

## 2020-04-08 DIAGNOSIS — Z6824 Body mass index (BMI) 24.0-24.9, adult: Secondary | ICD-10-CM | POA: Diagnosis not present

## 2020-04-08 DIAGNOSIS — F5101 Primary insomnia: Secondary | ICD-10-CM | POA: Diagnosis not present

## 2020-05-01 ENCOUNTER — Other Ambulatory Visit: Payer: Self-pay

## 2020-05-01 ENCOUNTER — Encounter (INDEPENDENT_AMBULATORY_CARE_PROVIDER_SITE_OTHER): Payer: Self-pay

## 2020-05-01 ENCOUNTER — Encounter: Payer: Self-pay | Admitting: Cardiology

## 2020-05-01 ENCOUNTER — Ambulatory Visit (INDEPENDENT_AMBULATORY_CARE_PROVIDER_SITE_OTHER): Payer: Medicare Other | Admitting: Cardiology

## 2020-05-01 VITALS — BP 158/86 | HR 95 | Ht 65.0 in | Wt 133.0 lb

## 2020-05-01 DIAGNOSIS — I493 Ventricular premature depolarization: Secondary | ICD-10-CM

## 2020-05-01 DIAGNOSIS — I10 Essential (primary) hypertension: Secondary | ICD-10-CM

## 2020-05-01 NOTE — Patient Instructions (Signed)
Medication Instructions:  Your physician recommends that you continue on your current medications as directed. Please refer to the Current Medication list given to you today.  *If you need a refill on your cardiac medications before your next appointment, please call your pharmacy*   Lab Work: None today If you have labs (blood work) drawn today and your tests are completely normal, you will receive your results only by: Marland Kitchen MyChart Message (if you have MyChart) OR . A paper copy in the mail If you have any lab test that is abnormal or we need to change your treatment, we will call you to review the results.   Testing/Procedures: None today   Follow-Up: At [facility], you and your health needs are our priority.  As part of our continuing mission to provide you with exceptional heart care, we have created designated Provider Care Teams.  These Care Teams include your primary Cardiologist (physician) and Advanced Practice Providers (APPs -  Physician Assistants and Nurse Practitioners) who all work together to provide you with the care you need, when you need it.  We recommend signing up for the patient portal called "MyChart".  Sign up information is provided on this After Visit Summary.  MyChart is used to connect with patients for Virtual Visits (Telemedicine).  Patients are able to view lab/test results, encounter notes, upcoming appointments, etc.  Non-urgent messages can be sent to your provider as well.   To learn more about what you can do with MyChart, go to [email or web address].    Your next appointment:   6 month(s)  The format for your next appointment:   In Person  Provider:   Dr.McDowell   Other Instructions None        Thank you for choosing [facility] !

## 2020-05-01 NOTE — Progress Notes (Signed)
Cardiology Office Note  Date: 05/01/2020   ID: Haas, Marilyn Jul 27, 1940, MRN [id number]  PCP:  Redmond School, MD  Cardiologist:  Rozann Lesches, MD Electrophysiologist:  None   Chief Complaint  Patient presents with  . Cardiac follow-up    History of Present Illness: Marilyn Haas is an 80 y.o. female last seen in August 2021 by Ms. Strader PA-C.  She presents for a routine visit.  Her blood pressure trend has been elevated, she saw her PCP recently and is to start on lisinopril 2.5 mg daily.  She does have an automatic cuff at home to track blood pressure regularly.  She does not describe any palpitations, has had no syncope.  I reviewed the remainder of her medications which are outlined below.  I also reviewed her lab work from November 2021 as noted below.  Past Medical History:  Diagnosis Date  . Anemia   . Anxiety   . Arthritis   . Breast cancer ([place]) 05/19/2016   Right breast  . Childhood asthma   . Chronic back pain    Scoliosis, stenosis  . Depression   . Diverticulosis   . GERD (gastroesophageal reflux disease)   . Hard of hearing   . Headache   . History of blood transfusion   . History of colon polyps   . History of hiatal hernia   . History of pneumonia   . Insomnia   . Osteopenia   . Osteoporosis   . Personal history of radiation therapy   . Thickened endometrium 09/29/2017    Past Surgical History:  Procedure Laterality Date  . BREAST LUMPECTOMY WITH RADIOACTIVE SEED AND SENTINEL LYMPH NODE BIOPSY Right 07/31/2016   Procedure: BREAST LUMPECTOMY WITH RADIOACTIVE SEED AND SENTINEL LYMPH NODE BIOPSY, INJECT BLUE DYE RIGHT BREAST;  Surgeon: Fanny Skates, MD;  Location: [facility];  Service: General;  Laterality: Right;  . COLONOSCOPY    . 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 ESOPHAGEAL  DILATION N/A 09/24/2017   Procedure: ESOPHAGEAL DILATION;  Surgeon: Rogene Houston, MD;  [facility];  Service: Endoscopy;  Laterality: N/A;  . ESOPHAGEAL DILATION N/A 01/19/2020   Procedure: ESOPHAGEAL DILATION;  Surgeon: Harvel Quale, MD;  [facility];  Service: Gastroenterology;  Laterality: N/A;  . ESOPHAGEAL DILATION  02/28/2020   Procedure: ESOPHAGEAL DILATION;  Surgeon: Rogene Houston, MD;  [facility];  Service: Endoscopy;;  . ESOPHAGOGASTRODUODENOSCOPY N/A 01/26/2014   Procedure: ESOPHAGOGASTRODUODENOSCOPY (EGD);  Surgeon: Rogene Houston, MD;  [facility];  Service: Endoscopy;  Laterality: N/A;  1030  . ESOPHAGOGASTRODUODENOSCOPY (EGD) WITH PROPOFOL N/A 09/24/2017   Procedure: ESOPHAGOGASTRODUODENOSCOPY (EGD) WITH PROPOFOL;  Surgeon: Rogene Houston, MD;  [facility];  Service: Endoscopy;  Laterality: N/A;  11:15  . ESOPHAGOGASTRODUODENOSCOPY (EGD) WITH PROPOFOL N/A 01/19/2020   Procedure: ESOPHAGOGASTRODUODENOSCOPY (EGD) WITH PROPOFOL;  Surgeon: Harvel Quale, MD;  [facility];  Service: Gastroenterology;  Laterality: N/A;  1200  . ESOPHAGOGASTRODUODENOSCOPY (EGD) WITH PROPOFOL N/A 02/28/2020   Procedure: ESOPHAGOGASTRODUODENOSCOPY (EGD) WITH PROPOFOL;  Surgeon: Rogene Houston, MD;  [facility];  Service: Endoscopy;  Laterality: N/A;  2:45  . HIP FRACTURE SURGERY     05/2009 left -[facility]  . HYSTEROSCOPY WITH D & C  05/24/2018   Procedure:  DILATATION AND CURETTAGE /HYSTEROSCOPY (PROCEDURE # 2)/PAP SMEAR (PROCEDURE #1);  Surgeon: Jonnie Kind, MD;  [facility];  Service: Gynecology;;  . Venia Minks DILATION N/A 01/26/2014   Procedure: Keturah Shavers;  Surgeon: Rogene Houston, MD;  [facility];  Service: Endoscopy;  Laterality: N/A;    Current Outpatient Medications  Medication Sig Dispense Refill  . ALPRAZolam (XANAX) 0.25 MG tablet Take 0.25 mg by mouth at  bedtime.    . ARIPiprazole (ABILIFY) 2 MG tablet Take 2 mg by mouth daily.    . cyanocobalamin (,VITAMIN B-12,) 1000 MCG/ML injection Inject 1,000 mcg into the muscle every 30 (thirty) days.    . DULoxetine (CYMBALTA) 60 MG capsule Take 120 mg by mouth daily.    Marland Kitchen HYDROcodone-acetaminophen (NORCO) 10-325 MG tablet Take 1 tablet by mouth 3 (three) times daily as needed for moderate pain.     Marland Kitchen linaclotide (LINZESS) 145 MCG CAPS capsule Take 1 capsule (145 mcg total) by mouth daily before breakfast. 30 capsule 5  . lisinopril (ZESTRIL) 2.5 MG tablet Take 2.5 mg by mouth daily.    . mirtazapine (REMERON) 15 MG tablet Take 1 tablet (15 mg total) by mouth at bedtime. 90 tablet 0  . omeprazole (PRILOSEC) 20 MG capsule Take 1 capsule (20 mg total) by mouth 2 (two) times daily before a meal. 60 capsule 5  . Polyethyl Glycol-Propyl Glycol (SYSTANE OP) Place 1 drop into both eyes daily as needed (for dry eyes).    . psyllium (METAMUCIL SMOOTH TEXTURE) 58.6 % powder Take 1 packet by mouth at bedtime.  12  . tamoxifen (NOLVADEX) 20 MG tablet TAKE ONE TABLET (20MG  TOTAL) BY MOUTH DAILY 30 tablet 3  . zolpidem (AMBIEN) 5 MG tablet Take 5 mg by mouth at bedtime as needed.     No current facility-administered medications for this visit.   Allergies:  Avelox [moxifloxacin hcl in nacl]   ROS: No syncope.  Physical Exam: VS:  BP (!) 158/86   Pulse 95   Ht 5\' 5"  (1.651 m)   Wt 133 lb (60.3 kg)   SpO2 95%   BMI 22.13 kg/m , BMI Body mass index is 22.13 kg/m.  Wt Readings from Last 3 Encounters:  05/01/20 133 lb (60.3 kg)  03/28/20 133 lb (60.3 kg)  03/07/20 133 lb 14.4 oz (60.7 kg)    General: Pleasant elderly woman, appears comfortable at rest. HEENT: Conjunctiva and lids normal, wearing a mask. Neck: Supple, no elevated JVP or carotid bruits, no thyromegaly. Lungs: Clear to auscultation, nonlabored breathing at rest. Cardiac: Regular rate and rhythm, no S3, soft systolic murmur, no pericardial  rub. Extremities: No pitting edema.  ECG:  An ECG dated 12/05/2018 was personally reviewed today and demonstrated:  Sinus rhythm.  Recent Labwork: 01/17/2020: BUN 11; Creatinine, Ser 0.92; Hemoglobin 12.4; Platelets 321; Potassium 4.1; Sodium 132   Other Studies Reviewed Today:  Echocardiogram 02/18/2016: - Left ventricle: The cavity size was normal. Wall thickness was  increased in a pattern of mild LVH. Systolic function was normal.  The estimated ejection fraction was in the range of 55% to 60%.  Wall motion was normal; there were no regional wall motion  abnormalities. Doppler parameters are consistent with abnormal  left ventricular relaxation (grade 1 diastolic dysfunction).  - Aortic valve: Mildly calcified annulus. Trileaflet. There was  mild regurgitation.  - Mitral valve: There was mild regurgitation.  - Right atrium: Central venous pressure (est): 3 mm Hg.  - Atrial septum: No  defect or patent foramen ovale was identified.  - Tricuspid valve: There was mild regurgitation.  - Pulmonary arteries: PA peak pressure: 22 mm Hg (S).   Impressions:   - Mild LVH with LVEF 55-60% and grade 1 diastolic dysfunction. Mild  mitral regurgitation. Mildly calcified aortic annulus with mild  aortic regurgitation. Mild tricuspid regurgitation with normal  estimated PASP 22 mmHg.   Assessment and Plan:  1.  History of palpitations and PVCs, currently without active symptoms.  Heart rate is regular today.  2.  Essential hypertension, starting on lisinopril 2.5 mg daily per PCP.  She has an automatic blood pressure cuff to track blood pressure at home.  3.  History of GERD and esophageal dysmotility.  She continues to follow with Dr. Laural Golden  Medication Adjustments/Labs and Tests Ordered: Current medicines are reviewed at length with the patient today.  Concerns regarding medicines are outlined above.   Tests Ordered: No orders of the defined types were placed in this  encounter.   Medication Changes: No orders of the defined types were placed in this encounter.   Disposition:  Follow up 6 months in the [place] office.  Signed, Satira Sark, MD, [facility] 05/01/2020 2:09 PM    [facility] at [facility] [address], [place], [place] [address] Phone: [phone]; Fax: [phone]

## 2020-05-06 DIAGNOSIS — F5101 Primary insomnia: Secondary | ICD-10-CM | POA: Diagnosis not present

## 2020-05-06 DIAGNOSIS — F419 Anxiety disorder, unspecified: Secondary | ICD-10-CM | POA: Diagnosis not present

## 2020-05-06 DIAGNOSIS — G894 Chronic pain syndrome: Secondary | ICD-10-CM | POA: Diagnosis not present

## 2020-05-06 DIAGNOSIS — M150 Primary generalized (osteo)arthritis: Secondary | ICD-10-CM | POA: Diagnosis not present

## 2020-06-03 DIAGNOSIS — I10 Essential (primary) hypertension: Secondary | ICD-10-CM | POA: Diagnosis not present

## 2020-06-03 DIAGNOSIS — G894 Chronic pain syndrome: Secondary | ICD-10-CM | POA: Diagnosis not present

## 2020-06-26 DIAGNOSIS — Z6822 Body mass index (BMI) 22.0-22.9, adult: Secondary | ICD-10-CM | POA: Diagnosis not present

## 2020-06-26 DIAGNOSIS — Z1331 Encounter for screening for depression: Secondary | ICD-10-CM | POA: Diagnosis not present

## 2020-06-26 DIAGNOSIS — G894 Chronic pain syndrome: Secondary | ICD-10-CM | POA: Diagnosis not present

## 2020-06-26 DIAGNOSIS — T8484XA Pain due to internal orthopedic prosthetic devices, implants and grafts, initial encounter: Secondary | ICD-10-CM | POA: Diagnosis not present

## 2020-07-25 DIAGNOSIS — G894 Chronic pain syndrome: Secondary | ICD-10-CM | POA: Diagnosis not present

## 2020-07-25 DIAGNOSIS — F5101 Primary insomnia: Secondary | ICD-10-CM | POA: Diagnosis not present

## 2020-07-25 DIAGNOSIS — F419 Anxiety disorder, unspecified: Secondary | ICD-10-CM | POA: Diagnosis not present

## 2020-08-26 DIAGNOSIS — F419 Anxiety disorder, unspecified: Secondary | ICD-10-CM | POA: Diagnosis not present

## 2020-08-26 DIAGNOSIS — G894 Chronic pain syndrome: Secondary | ICD-10-CM | POA: Diagnosis not present

## 2020-08-26 DIAGNOSIS — F5101 Primary insomnia: Secondary | ICD-10-CM | POA: Diagnosis not present

## 2020-09-24 DIAGNOSIS — G894 Chronic pain syndrome: Secondary | ICD-10-CM | POA: Diagnosis not present

## 2020-10-09 DIAGNOSIS — Z6822 Body mass index (BMI) 22.0-22.9, adult: Secondary | ICD-10-CM | POA: Diagnosis not present

## 2020-10-09 DIAGNOSIS — G894 Chronic pain syndrome: Secondary | ICD-10-CM | POA: Diagnosis not present

## 2020-10-09 DIAGNOSIS — I10 Essential (primary) hypertension: Secondary | ICD-10-CM | POA: Diagnosis not present

## 2020-10-24 DIAGNOSIS — F5101 Primary insomnia: Secondary | ICD-10-CM | POA: Diagnosis not present

## 2020-10-24 DIAGNOSIS — G894 Chronic pain syndrome: Secondary | ICD-10-CM | POA: Diagnosis not present

## 2020-10-24 DIAGNOSIS — F419 Anxiety disorder, unspecified: Secondary | ICD-10-CM | POA: Diagnosis not present

## 2020-11-12 ENCOUNTER — Encounter: Payer: Self-pay | Admitting: Cardiology

## 2020-11-12 ENCOUNTER — Other Ambulatory Visit: Payer: Self-pay

## 2020-11-12 ENCOUNTER — Ambulatory Visit (INDEPENDENT_AMBULATORY_CARE_PROVIDER_SITE_OTHER): Payer: Medicare Other | Admitting: Cardiology

## 2020-11-12 VITALS — BP 162/98 | HR 99 | Ht 65.0 in | Wt 132.4 lb

## 2020-11-12 DIAGNOSIS — R002 Palpitations: Secondary | ICD-10-CM

## 2020-11-12 DIAGNOSIS — I10 Essential (primary) hypertension: Secondary | ICD-10-CM

## 2020-11-12 MED ORDER — LOSARTAN POTASSIUM 25 MG PO TABS: 25.0000 mg | ORAL_TABLET | Freq: Two times a day (BID) | ORAL | 3 refills | Status: DC

## 2020-11-12 NOTE — Progress Notes (Signed)
Cardiology Office Note  Date: 11/12/2020   ID: Sargun, Olivencia 1940/12/24, MRN [id number]  PCP:  Redmond School, MD  Cardiologist:  Rozann Lesches, MD Electrophysiologist:  None   Chief Complaint  Patient presents with   Cardiac follow-up     History of Present Illness: Marilyn Haas is an 80 y.o. female last seen in February.  She is here today with her son for a follow-up visit.  Reports no sense of palpitations or chest pain.  She has had a dry cough for few months now.  Symptoms worse when she lays down and also with sinus drainage.  On the other hand she is also on lisinopril which has been increased since her last visit.  Systolics at home have been generally under 140.  I personally reviewed her ECG today which shows normal sinus rhythm.  No ectopy on cardiac examination.  She has had no sudden dizziness or syncope.  Past Medical History:  Diagnosis Date   Anemia    Anxiety    Arthritis    Breast cancer ([place]) 05/19/2016   Right breast   Childhood asthma    Chronic back pain    Scoliosis, stenosis   Depression    Diverticulosis    GERD (gastroesophageal reflux disease)    Hard of hearing    Headache    History of blood transfusion    History of colon polyps    History of hiatal hernia    History of pneumonia    Insomnia    Osteopenia    Osteoporosis    Personal history of radiation therapy    Thickened endometrium 09/29/2017    Past Surgical History:  Procedure Laterality Date   BREAST LUMPECTOMY WITH RADIOACTIVE SEED AND SENTINEL LYMPH NODE BIOPSY Right 07/31/2016   Procedure: BREAST LUMPECTOMY WITH RADIOACTIVE SEED AND SENTINEL LYMPH NODE BIOPSY, INJECT BLUE DYE RIGHT BREAST;  Surgeon: Fanny Skates, MD;  Location: [facility];  Service: General;  Laterality: Right;   COLONOSCOPY     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 07/04/2014   Procedure: LEFT CONVERSION TO TOTAL HIP ARTHROPLASTY;  Surgeon: Gaynelle Arabian, MD;  [facility];  Service:  Orthopedics;  Laterality: Left;   ESOPHAGEAL DILATION N/A 09/24/2017   Procedure: ESOPHAGEAL DILATION;  Surgeon: Rogene Houston, MD;  [facility];  Service: Endoscopy;  Laterality: N/A;   ESOPHAGEAL DILATION N/A 01/19/2020   Procedure: ESOPHAGEAL DILATION;  Surgeon: Harvel Quale, MD;  [facility];  Service: Gastroenterology;  Laterality: N/A;   ESOPHAGEAL DILATION  02/28/2020   Procedure: ESOPHAGEAL DILATION;  Surgeon: Rogene Houston, MD;  [facility];  Service: Endoscopy;;   ESOPHAGOGASTRODUODENOSCOPY N/A 01/26/2014   Procedure: ESOPHAGOGASTRODUODENOSCOPY (EGD);  Surgeon: Rogene Houston, MD;  [facility];  Service: Endoscopy;  Laterality: N/A;  1030   ESOPHAGOGASTRODUODENOSCOPY (EGD) WITH PROPOFOL N/A 09/24/2017   Procedure: ESOPHAGOGASTRODUODENOSCOPY (EGD) WITH PROPOFOL;  Surgeon: Rogene Houston, MD;  [facility];  Service: Endoscopy;  Laterality: N/A;  11:15   ESOPHAGOGASTRODUODENOSCOPY (EGD) WITH PROPOFOL N/A 01/19/2020   Procedure: ESOPHAGOGASTRODUODENOSCOPY (EGD) WITH PROPOFOL;  Surgeon: Harvel Quale, MD;  [facility];  Service: Gastroenterology;  Laterality: N/A;  1200   ESOPHAGOGASTRODUODENOSCOPY (EGD) WITH PROPOFOL N/A 02/28/2020   Procedure: ESOPHAGOGASTRODUODENOSCOPY (EGD) WITH PROPOFOL;  Surgeon: Rogene Houston, MD;  [facility];  Service: Endoscopy;  Laterality: N/A;  2:45   HIP FRACTURE SURGERY  05/2009 left -[place]   HYSTEROSCOPY WITH D & C  05/24/2018   Procedure: DILATATION AND CURETTAGE /HYSTEROSCOPY (PROCEDURE # 2)/PAP SMEAR (PROCEDURE #1);  Surgeon: Jonnie Kind, MD;  [facility];  Service: Gynecology;;   Venia Minks DILATION N/A 01/26/2014   Procedure: Keturah Shavers;  Surgeon: Rogene Houston, MD;  [facility];  Service: Endoscopy;  Laterality: N/A;    Current Outpatient Medications  Medication Sig Dispense Refill   ALPRAZolam (XANAX)  0.25 MG tablet Take 0.25 mg by mouth at bedtime.     ARIPiprazole (ABILIFY) 2 MG tablet Take 2 mg by mouth daily.     cyanocobalamin (,VITAMIN B-12,) 1000 MCG/ML injection Inject 1,000 mcg into the muscle every 30 (thirty) days.     DULoxetine (CYMBALTA) 60 MG capsule Take 120 mg by mouth daily.     HYDROcodone-acetaminophen (NORCO) 10-325 MG tablet Take 1 tablet by mouth 3 (three) times daily as needed for moderate pain.      linaclotide (LINZESS) 145 MCG CAPS capsule Take 1 capsule (145 mcg total) by mouth daily before breakfast. 30 capsule 5   losartan (COZAAR) 25 MG tablet Take 1 tablet (25 mg total) by mouth 2 (two) times daily. 180 tablet 3   mirtazapine (REMERON) 15 MG tablet Take 1 tablet (15 mg total) by mouth at bedtime. 90 tablet 0   omeprazole (PRILOSEC) 20 MG capsule Take 1 capsule (20 mg total) by mouth 2 (two) times daily before a meal. 60 capsule 5   Polyethyl Glycol-Propyl Glycol (SYSTANE OP) Place 1 drop into both eyes daily as needed (for dry eyes).     psyllium (METAMUCIL SMOOTH TEXTURE) 58.6 % powder Take 1 packet by mouth at bedtime.  12   tamoxifen (NOLVADEX) 20 MG tablet TAKE ONE TABLET ('20MG'$  TOTAL) BY MOUTH DAILY 30 tablet 3   zolpidem (AMBIEN) 5 MG tablet Take 5 mg by mouth at bedtime as needed.     No current facility-administered medications for this visit.   Allergies:  Avelox [moxifloxacin hcl in nacl]   ROS: No orthopnea or PND.  Physical Exam: VS:  BP (!) 162/98   Pulse 99   Ht '5\' 5"'$  (1.651 m)   Wt 132 lb 6.4 oz (60.1 kg)   SpO2 98%   BMI 22.03 kg/m , BMI Body mass index is 22.03 kg/m.  Wt Readings from Last 3 Encounters:  11/12/20 132 lb 6.4 oz (60.1 kg)  05/01/20 133 lb (60.3 kg)  03/28/20 133 lb (60.3 kg)    General: Patient appears comfortable at rest. HEENT: Conjunctiva and lids normal, wearing a mask. Neck: Supple, no elevated JVP or carotid bruits, no thyromegaly. Lungs: Clear to auscultation, nonlabored breathing at rest. Cardiac: Regular  rate and rhythm, no S3 or significant systolic murmur, no pericardial rub. Extremities: No pitting edema.  ECG:  An ECG dated 12/05/2018 was personally reviewed today and demonstrated:  Sinus rhythm.  Recent Labwork: 01/17/2020: BUN 11; Creatinine, Ser 0.92; Hemoglobin 12.4; Platelets 321; Potassium 4.1; Sodium 132   Other Studies Reviewed Today:  Echocardiogram 02/18/2016: - Left ventricle: The cavity size was normal. Wall thickness was    increased in a pattern of mild LVH. Systolic function was normal.    The estimated ejection fraction was in the range of 55% to 60%.    Wall motion was normal; there were no regional wall motion    abnormalities. Doppler parameters are consistent with abnormal    left ventricular relaxation (grade 1 diastolic dysfunction).  -  Aortic valve: Mildly calcified annulus. Trileaflet. There was    mild regurgitation.  - Mitral valve: There was mild regurgitation.  - Right atrium: Central venous pressure (est): 3 mm Hg.  - Atrial septum: No defect or patent foramen ovale was identified.  - Tricuspid valve: There was mild regurgitation.  - Pulmonary arteries: PA peak pressure: 22 mm Hg (S).   Impressions:   - Mild LVH with LVEF 55-60% and grade 1 diastolic dysfunction. Mild    mitral regurgitation. Mildly calcified aortic annulus with mild    aortic regurgitation. Mild tricuspid regurgitation with normal    estimated PASP 22 mmHg.   Assessment and Plan:  1.  History of PVCs, quiescent at this point without any sense of palpitations.  ECG is normal today.  2.  Essential hypertension, now on lisinopril 5 mg daily.  Systolics generally under 140 at home, blood pressure elevated today.  She does describe dry cough as discussed above, could be sinus drainage or allergic component, but since on ACE inhibitor we will switch to losartan 25 mg daily to see if this makes any difference.  Check BMET in 2 weeks.  Medication Adjustments/Labs and Tests  Ordered: Current medicines are reviewed at length with the patient today.  Concerns regarding medicines are outlined above.   Tests Ordered: Orders Placed This Encounter  Procedures   Basic metabolic panel   EKG [id number]    Medication Changes: Meds ordered this encounter  Medications   losartan (COZAAR) 25 MG tablet    Sig: Take 1 tablet (25 mg total) by mouth 2 (two) times daily.    Dispense:  180 tablet    Refill:  3     Disposition:  Follow up  6 months.  Signed, Satira Sark, MD, [facility] 11/12/2020 2:33 PM    [facility] at [facility] [address] [place],  [address] Phone: [phone]; Fax: [phone]

## 2020-11-12 NOTE — Patient Instructions (Signed)
Medication Instructions:  Your physician has recommended you make the following change in your medication:  STOP Lisinopril 2.5 mg tablets START Losartan 25 mg tablets twice daily   *You may take an OTC allergy medication such as Xyzal or Zyrtec*  *If you need a refill on your cardiac medications before your next appointment, please call your pharmacy*   Lab Work: IN TWO (2) WEEKS: BMET   If you have labs (blood work) drawn today and your tests are completely normal, you will receive your results only by: [place] (if you have MyChart) OR A paper copy in the mail If you have any lab test that is abnormal or we need to change your treatment, we will call you to review the results.   Testing/Procedures: None   Follow-Up: At [facility], you and your health needs are our priority.  As part of our continuing mission to provide you with exceptional heart care, we have created designated Provider Care Teams.  These Care Teams include your primary Cardiologist (physician) and Advanced Practice Providers (APPs -  Physician Assistants and Nurse Practitioners) who all work together to provide you with the care you need, when you need it.  We recommend signing up for the patient portal called "MyChart".  Sign up information is provided on this After Visit Summary.  MyChart is used to connect with patients for Virtual Visits (Telemedicine).  Patients are able to view lab/test results, encounter notes, upcoming appointments, etc.  Non-urgent messages can be sent to your provider as well.   To learn more about what you can do with MyChart, go to [email or web address].    Your next appointment:   6 month(s)  The format for your next appointment:   In Person  Provider:   Rozann Lesches, MD   Other Instructions

## 2020-11-20 DIAGNOSIS — G894 Chronic pain syndrome: Secondary | ICD-10-CM | POA: Diagnosis not present

## 2020-11-20 DIAGNOSIS — T8484XA Pain due to internal orthopedic prosthetic devices, implants and grafts, initial encounter: Secondary | ICD-10-CM | POA: Diagnosis not present

## 2020-11-22 ENCOUNTER — Other Ambulatory Visit: Payer: Self-pay | Admitting: Oncology

## 2020-12-16 ENCOUNTER — Telehealth: Payer: Self-pay | Admitting: Oncology

## 2020-12-16 NOTE — Telephone Encounter (Signed)
Rescheduled per 10/3 sch msg. Pt was called and confirmed appt

## 2020-12-26 ENCOUNTER — Other Ambulatory Visit: Payer: Medicare Other

## 2020-12-26 ENCOUNTER — Ambulatory Visit: Payer: Medicare Other | Admitting: Oncology

## 2020-12-26 DIAGNOSIS — T8484XA Pain due to internal orthopedic prosthetic devices, implants and grafts, initial encounter: Secondary | ICD-10-CM | POA: Diagnosis not present

## 2020-12-26 DIAGNOSIS — F5101 Primary insomnia: Secondary | ICD-10-CM | POA: Diagnosis not present

## 2020-12-26 DIAGNOSIS — C50919 Malignant neoplasm of unspecified site of unspecified female breast: Secondary | ICD-10-CM | POA: Diagnosis not present

## 2020-12-26 DIAGNOSIS — M150 Primary generalized (osteo)arthritis: Secondary | ICD-10-CM | POA: Diagnosis not present

## 2020-12-26 DIAGNOSIS — G894 Chronic pain syndrome: Secondary | ICD-10-CM | POA: Diagnosis not present

## 2020-12-26 DIAGNOSIS — Z6822 Body mass index (BMI) 22.0-22.9, adult: Secondary | ICD-10-CM | POA: Diagnosis not present

## 2020-12-26 DIAGNOSIS — F419 Anxiety disorder, unspecified: Secondary | ICD-10-CM | POA: Diagnosis not present

## 2020-12-26 DIAGNOSIS — I10 Essential (primary) hypertension: Secondary | ICD-10-CM | POA: Diagnosis not present

## 2020-12-26 DIAGNOSIS — Z23 Encounter for immunization: Secondary | ICD-10-CM | POA: Diagnosis not present

## 2020-12-31 ENCOUNTER — Ambulatory Visit
Admission: EM | Admit: 2020-12-31 | Discharge: 2020-12-31 | Disposition: A | Discharge status: Home / Self Care | Payer: Medicare Other | Attending: Family Medicine | Admitting: Family Medicine

## 2020-12-31 DIAGNOSIS — R600 Localized edema: Secondary | ICD-10-CM

## 2020-12-31 MED ORDER — FUROSEMIDE 20 MG PO TABS: 20.0000 mg | ORAL_TABLET | Freq: Every day | ORAL | 0 refills | Status: DC

## 2020-12-31 NOTE — Discharge Instructions (Signed)
Swelling happens when fluid collects in small spaces around tissues and organs inside the body. Another word for swelling is "edema." Some common parts of the body where people can have swelling are the lower legs or hands. This typically is worse in the areas of the body that are closest to the ground (because of gravity)  Symptoms of swelling can include puffiness of the skin, which can cause the skin to look stretched and shiny. This often occurs with swelling in the lower legs and can be worse after you sit or stand for a long time.  Treatment of edema includes several components: treatment of the underlying cause (if possible), reducing the amount of salt (sodium) in your diet, and, in many cases, use of a medication called a diuretic to eliminate excess fluid. Using compression stockings and elevating the legs may also be recommended.

## 2020-12-31 NOTE — ED Triage Notes (Signed)
Pt presents with BLE redness and flaking skin

## 2021-01-01 NOTE — ED Provider Notes (Addendum)
[facility]   [id number] 12/31/20 Arrival Time: 0350  ASSESSMENT & PLAN:  1. Bilateral lower extremity edema    No signs of skin infection. Reassured. This was her original concern.  Begin trial of: Meds ordered this encounter  Medications   furosemide (LASIX) 20 MG tablet    Sig: Take 1 tablet (20 mg total) by mouth daily for 5 days.    Dispense:  5 tablet    Refill:  0   Has support hose at home to start using.  Recommend:  Follow-up Information     Redmond School, MD.   Specialty: Internal Medicine Why: If worsening or failing to improve as anticipated. Contact information: [address] [place] [address] [phone]                  Discharge Instructions      Swelling happens when fluid collects in small spaces around tissues and organs inside the body. Another word for swelling is "edema." Some common parts of the body where people can have swelling are the lower legs or hands. This typically is worse in the areas of the body that are closest to the ground (because of gravity)  Symptoms of swelling can include puffiness of the skin, which can cause the skin to look stretched and shiny. This often occurs with swelling in the lower legs and can be worse after you sit or stand for a long time.  Treatment of edema includes several components: treatment of the underlying cause (if possible), reducing the amount of salt (sodium) in your diet, and, in many cases, use of a medication called a diuretic to eliminate excess fluid. Using compression stockings and elevating the legs may also be recommended.       Reviewed expectations re: course of current medical issues. Questions answered. Outlined signs and symptoms indicating need for more acute intervention. Patient verbalized understanding. After Visit Summary given.   SUBJECTIVE:  History from: patient and family. Marilyn Haas is a 80 y.o. female who presents with complaint  of bilateral LE skin 'redness'. Noted recently with foot/ankle edema. No pain. H/O similar in the past. No extremity sensation changes or weakness. Ambulatory without difficulty. No recent illnesses. No CP/SOB. Denies: fatigue, irregular heart beat, and palpitations. Aggravating factors: have not been identified. Alleviating factors: have not been identified. Self/OTC treatment: none.  Social History   Tobacco Use  Smoking Status Former   Packs/day: 0.50   Years: 2.00   Pack years: 1.00   Types: Cigarettes   Quit date: 01/15/1961   Years since quitting: 60.0  Smokeless Tobacco Never   Social History   Substance and Sexual Activity  Alcohol Use Yes   Alcohol/week: 0.0 standard drinks   Comment: occasionally     OBJECTIVE:  Vitals:   12/31/20 1554  BP: (!) 167/106  Pulse: 94  Resp: 14  Temp: 97.9 F (36.6 C)  TempSrc: Oral  SpO2: 94%    General appearance: alert, oriented, no acute distress Eyes: PERRLA; EOMI; conjunctivae normal HENT: normocephalic; atraumatic Neck: supple with FROM Lungs: without labored respirations; speaks full sentences without difficulty Heart: regular Abdomen: soft Extremities: with bilateral ankle/foot edema; mild venous insufficiency skin changes; without calf swelling or tenderness; symmetrical without gross deformities Skin: warm and dry; without rash or lesions Neuro: normal gait Psychological: alert and cooperative; normal mood and affect   Allergies  Allergen Reactions   Avelox [Moxifloxacin Hcl In Nacl] Other (See Comments)    Altered mental status  Past Medical History:  Diagnosis Date   Anemia    Anxiety    Arthritis    Breast cancer ([place]) 05/19/2016   Right breast   Childhood asthma    Chronic back pain    Scoliosis, stenosis   Depression    Diverticulosis    GERD (gastroesophageal reflux disease)    Hard of hearing    Headache    History of blood transfusion    History of colon polyps    History of hiatal  hernia    History of pneumonia    Insomnia    Osteopenia    Osteoporosis    Personal history of radiation therapy    Thickened endometrium 09/29/2017   Social History   Socioeconomic History   Marital status: Married    Spouse name: Not on file   Number of children: 3   Years of education: Not on file   Highest education level: Not on file  Occupational History    Employer: RETIRED  Tobacco Use   Smoking status: Former    Packs/day: 0.50    Years: 2.00    Pack years: 1.00    Types: Cigarettes    Quit date: 01/15/1961    Years since quitting: 60.0   Smokeless tobacco: Never  Vaping Use   Vaping Use: Never used  Substance and Sexual Activity   Alcohol use: Yes    Alcohol/week: 0.0 standard drinks    Comment: occasionally   Drug use: No   Sexual activity: Not Currently    Birth control/protection: Post-menopausal  Other Topics Concern   Not on file  Social History Narrative   Not on file   Social Determinants of Health   Financial Resource Strain: Not on file  Food Insecurity: Not on file  Transportation Needs: Not on file  Physical Activity: Not on file  Stress: Not on file  Social Connections: Not on file  Intimate Partner Violence: Not on file   Family History  Problem Relation Age of Onset   Heart failure Mother    Diabetes Mother    Hypertension Mother    Depression Mother    Other Father        stomach ulcers; abd aneursym   Hypertension Sister    Heart failure Sister    Diabetes Sister    Depression Sister    Diabetes Sister        prediabetes   Cancer Maternal Aunt        breast   Past Surgical History:  Procedure Laterality Date   BREAST LUMPECTOMY WITH RADIOACTIVE SEED AND SENTINEL LYMPH NODE BIOPSY Right 07/31/2016   Procedure: BREAST LUMPECTOMY WITH RADIOACTIVE SEED AND SENTINEL LYMPH NODE BIOPSY, INJECT BLUE DYE RIGHT BREAST;  Surgeon: Fanny Skates, MD;  Location: [facility];  Service: General;  Laterality: Right;   COLONOSCOPY      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 07/04/2014   Procedure: LEFT CONVERSION TO TOTAL HIP ARTHROPLASTY;  Surgeon: Gaynelle Arabian, MD;  Location: [facility];  Service: Orthopedics;  Laterality: Left;   ESOPHAGEAL DILATION N/A 09/24/2017   Procedure: ESOPHAGEAL DILATION;  Surgeon: Rogene Houston, MD;  [facility];  Service: Endoscopy;  Laterality: N/A;   ESOPHAGEAL DILATION N/A 01/19/2020   Procedure: ESOPHAGEAL DILATION;  Surgeon: Harvel Quale, MD;  [facility];  Service: Gastroenterology;  Laterality: N/A;   ESOPHAGEAL DILATION  02/28/2020   Procedure: ESOPHAGEAL DILATION;  Surgeon: Rogene Houston, MD;  [facility];  Service: Endoscopy;;   ESOPHAGOGASTRODUODENOSCOPY N/A 01/26/2014   Procedure: ESOPHAGOGASTRODUODENOSCOPY (EGD);  Surgeon: Rogene Houston, MD;  [facility];  Service: Endoscopy;  Laterality: N/A;  1030   ESOPHAGOGASTRODUODENOSCOPY (EGD) WITH PROPOFOL N/A 09/24/2017   Procedure: ESOPHAGOGASTRODUODENOSCOPY (EGD) WITH PROPOFOL;  Surgeon: Rogene Houston, MD;  [facility];  Service: Endoscopy;  Laterality: N/A;  11:15   ESOPHAGOGASTRODUODENOSCOPY (EGD) WITH PROPOFOL N/A 01/19/2020   Procedure: ESOPHAGOGASTRODUODENOSCOPY (EGD) WITH PROPOFOL;  Surgeon: Harvel Quale, MD;  [facility];  Service: Gastroenterology;  Laterality: N/A;  1200   ESOPHAGOGASTRODUODENOSCOPY (EGD) WITH PROPOFOL N/A 02/28/2020   Procedure: ESOPHAGOGASTRODUODENOSCOPY (EGD) WITH PROPOFOL;  Surgeon: Rogene Houston, MD;  [facility];  Service: Endoscopy;  Laterality: N/A;  2:45   HIP FRACTURE SURGERY     05/2009 left -[facility]   HYSTEROSCOPY WITH D & C  05/24/2018   Procedure: DILATATION AND CURETTAGE /HYSTEROSCOPY (PROCEDURE # 2)/PAP SMEAR (PROCEDURE #1);  Surgeon: Jonnie Kind, MD;  Location: [facility];  Service: Gynecology;;   Venia Minks DILATION N/A 01/26/2014   Procedure: Keturah Shavers;  Surgeon: Rogene Houston, MD;   [facility];  Service: Endoscopy;  Laterality: N/A;      Vanessa Kick, MD 01/01/21 5329    Vanessa Kick, MD 01/01/21 [phone]

## 2021-01-24 ENCOUNTER — Other Ambulatory Visit (INDEPENDENT_AMBULATORY_CARE_PROVIDER_SITE_OTHER): Payer: Self-pay | Admitting: Gastroenterology

## 2021-01-24 DIAGNOSIS — R1319 Other dysphagia: Secondary | ICD-10-CM

## 2021-01-27 DIAGNOSIS — Z23 Encounter for immunization: Secondary | ICD-10-CM | POA: Diagnosis not present

## 2021-01-27 DIAGNOSIS — M150 Primary generalized (osteo)arthritis: Secondary | ICD-10-CM | POA: Diagnosis not present

## 2021-01-27 DIAGNOSIS — G894 Chronic pain syndrome: Secondary | ICD-10-CM | POA: Diagnosis not present

## 2021-01-27 DIAGNOSIS — T8484XA Pain due to internal orthopedic prosthetic devices, implants and grafts, initial encounter: Secondary | ICD-10-CM | POA: Diagnosis not present

## 2021-01-30 ENCOUNTER — Other Ambulatory Visit (HOSPITAL_COMMUNITY): Payer: Self-pay | Admitting: Internal Medicine

## 2021-01-30 DIAGNOSIS — R928 Other abnormal and inconclusive findings on diagnostic imaging of breast: Secondary | ICD-10-CM

## 2021-01-30 DIAGNOSIS — Z9889 Other specified postprocedural states: Secondary | ICD-10-CM

## 2021-02-05 ENCOUNTER — Other Ambulatory Visit (INDEPENDENT_AMBULATORY_CARE_PROVIDER_SITE_OTHER): Payer: Self-pay | Admitting: Gastroenterology

## 2021-02-05 DIAGNOSIS — R1319 Other dysphagia: Secondary | ICD-10-CM

## 2021-02-20 ENCOUNTER — Other Ambulatory Visit (HOSPITAL_COMMUNITY): Payer: Self-pay | Admitting: Internal Medicine

## 2021-02-20 ENCOUNTER — Ambulatory Visit (HOSPITAL_COMMUNITY)
Admission: RE | Admit: 2021-02-20 | Discharge: 2021-02-20 | Disposition: A | Discharge status: Home / Self Care | Payer: Medicare Other | Source: Ambulatory Visit | Attending: Internal Medicine | Admitting: Internal Medicine

## 2021-02-20 ENCOUNTER — Other Ambulatory Visit: Payer: Self-pay

## 2021-02-20 DIAGNOSIS — Z9889 Other specified postprocedural states: Secondary | ICD-10-CM | POA: Insufficient documentation

## 2021-02-20 DIAGNOSIS — R922 Inconclusive mammogram: Secondary | ICD-10-CM | POA: Diagnosis not present

## 2021-02-20 DIAGNOSIS — T8484XA Pain due to internal orthopedic prosthetic devices, implants and grafts, initial encounter: Secondary | ICD-10-CM | POA: Diagnosis not present

## 2021-02-20 DIAGNOSIS — R928 Other abnormal and inconclusive findings on diagnostic imaging of breast: Secondary | ICD-10-CM | POA: Diagnosis not present

## 2021-02-20 DIAGNOSIS — Z853 Personal history of malignant neoplasm of breast: Secondary | ICD-10-CM | POA: Diagnosis not present

## 2021-02-20 DIAGNOSIS — G894 Chronic pain syndrome: Secondary | ICD-10-CM | POA: Diagnosis not present

## 2021-02-20 DIAGNOSIS — M150 Primary generalized (osteo)arthritis: Secondary | ICD-10-CM | POA: Diagnosis not present

## 2021-02-21 ENCOUNTER — Other Ambulatory Visit: Payer: Self-pay | Admitting: Internal Medicine

## 2021-02-21 DIAGNOSIS — R928 Other abnormal and inconclusive findings on diagnostic imaging of breast: Secondary | ICD-10-CM

## 2021-02-25 ENCOUNTER — Other Ambulatory Visit: Payer: Self-pay

## 2021-02-25 DIAGNOSIS — C50411 Malignant neoplasm of upper-outer quadrant of right female breast: Secondary | ICD-10-CM

## 2021-02-25 DIAGNOSIS — Z170 Estrogen receptor positive status [ER+]: Secondary | ICD-10-CM

## 2021-02-25 NOTE — Progress Notes (Signed)
[facility]  [phone] [phone]     ID: Marilyn Haas DOB: June 18, 1940  MR#: [id number]  QIH#:[id number]  Patient Care Team: Elfredia Nevins, MD as PCP - General (Internal Medicine) Jonelle Sidle, MD as PCP - Cardiology (Cardiology) Latiffany Harwick, Valentino Hue, MD as Consulting Physician (Oncology) Claud Kelp, MD as Consulting Physician (General Surgery) Dorothy Puffer, MD as Consulting Physician (Radiation Oncology) Malissa Hippo, MD as Consulting Physician (Gastroenterology) Ollen Gross, MD as Consulting Physician (Orthopedic Surgery) Axel Filler, Larna Daughters, NP as Nurse Practitioner (Hematology and Oncology) Neysa Hotter, MD as Consulting Physician (Psychiatry) Tilda Burrow, MD (Inactive) as Consulting Physician (Obstetrics and Gynecology) OTHER MD:  CHIEF COMPLAINT: Estrogen receptor positive lobular breast cancer  CURRENT TREATMENT: tamoxifen   INTERVAL HISTORY: Marilyn Haas returns today for follow-up of her estrogen receptor positive breast cancer.  She is accompanied by her son Genevie Cheshire  She continues on Tamoxifen with good tolerance. She reports occasional hot flashes, but they are not an issue. She reports vaginal wetness and wears a pad.  Since her last visit, she underwent bilateral diagnostic mammography with tomography and left breast ultrasonography at [facility] on 02/20/2021 showing: breast density category C; focal asymmetry/possible distortion within upper-outer left breast, without sonographic correlate; no abnormal-appearing left axillary lymph nodes; right lumpectomy changes without suspicious right breast findings.  She is scheduled for left breast biopsy on 03/06/2021.   REVIEW OF SYSTEMS: Marilyn Haas tells me she is sad because her husband died although that was 4 years ago, and that is the reason she is not exercising.  She says she would like bike that she could use in the house.  She tells me she has a  little bit of discomfort in the right breast at times.  A detailed review of systems today was otherwise noncontributory   COVID 19 VACCINATION STATUS: .  She had "all 5 vaccines" as of December 2022    BREAST CANCER HISTORY: From the original intake note:  The patient had bilateral screening mammography at the [facility] 04/22/2016 showing an area of possible asymmetry and calcifications in the right breast. Right diagnostic mammography with tomography and right breast ultrasonography 05/12/2016 showed the breast density to be category C. In the right breast upper outer quadrant there was a. A real lower area of architectural distortion which was palpable in the 11:00 radians 1 cm from the nipple. Ultrasonography confirmed an irregular hypoechoic mass at this location measuring 1.4 cm. Ultrasound of the right axilla was unremarkable.  Biopsy of the right breast mass in question 05/19/2016 showed ([id number]) and invasive lobular carcinoma, E-cadherin negative, estrogen receptor 90% positive, progesterone receptor 15% positive, both with strong staining intensity, with an MIB-1 of 5%, and no HER-2 amplification, the signals ratio being 1.51 and the number per cell 3.10.  Breast MRI 06/08/2016 confirmed an irregular enhancing mass in the upper-outer quadrant of the right breast measuring 1.5 cm, with adjacent biopsy clip artifact. There was an area of contiguous non-mass like enhancement bringing the total dimension to 1.9 cm. However there was no evidence of multicentric disease and no evidence of metastatic lymphadenopathy.  The patient's subsequent history is as detailed below   PAST MEDICAL HISTORY: Past Medical History:  Diagnosis Date   Anemia    Anxiety    Arthritis    Breast cancer (HCC) 05/19/2016   Right breast   Childhood asthma    Chronic back pain    Scoliosis, stenosis   Depression  Diverticulosis    GERD (gastroesophageal reflux disease)    Hard of hearing     Headache    History of blood transfusion    History of colon polyps    History of hiatal hernia    History of pneumonia    Insomnia    Osteopenia    Osteoporosis    Personal history of radiation therapy    Thickened endometrium 09/29/2017    PAST SURGICAL HISTORY: Past Surgical History:  Procedure Laterality Date   BREAST LUMPECTOMY WITH RADIOACTIVE SEED AND SENTINEL LYMPH NODE BIOPSY Right 07/31/2016   Procedure: BREAST LUMPECTOMY WITH RADIOACTIVE SEED AND SENTINEL LYMPH NODE BIOPSY, INJECT BLUE DYE RIGHT BREAST;  Surgeon: Claud Kelp, MD;  Location: [facility] OR;  Service: General;  Laterality: Right;   COLONOSCOPY     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 07/04/2014   Procedure: LEFT CONVERSION TO TOTAL HIP ARTHROPLASTY;  Surgeon: Ollen Gross, MD;  Location: [facility];  Service: Orthopedics;  Laterality: Left;   ESOPHAGEAL DILATION N/A 09/24/2017   Procedure: ESOPHAGEAL DILATION;  Surgeon: Malissa Hippo, MD;  Location: [facility];  Service: Endoscopy;  Laterality: N/A;   ESOPHAGEAL DILATION N/A 01/19/2020   Procedure: ESOPHAGEAL DILATION;  Surgeon: Dolores Frame, MD;  Location: [facility];  Service: Gastroenterology;  Laterality: N/A;   ESOPHAGEAL DILATION  02/28/2020   Procedure: ESOPHAGEAL DILATION;  Surgeon: Malissa Hippo, MD;  [facility]: [facility];  Service: Endoscopy;;   ESOPHAGOGASTRODUODENOSCOPY N/A 01/26/2014   Procedure: ESOPHAGOGASTRODUODENOSCOPY (EGD);  Surgeon: Malissa Hippo, MD;  [facility]: [facility];  Service: Endoscopy;  Laterality: N/A;  1030   ESOPHAGOGASTRODUODENOSCOPY (EGD) WITH PROPOFOL N/A 09/24/2017   Procedure: ESOPHAGOGASTRODUODENOSCOPY (EGD) WITH PROPOFOL;  Surgeon: Malissa Hippo, MD;  [facility];  Service: Endoscopy;  Laterality: N/A;  11:15   ESOPHAGOGASTRODUODENOSCOPY (EGD) WITH PROPOFOL N/A 01/19/2020   Procedure: ESOPHAGOGASTRODUODENOSCOPY (EGD) WITH PROPOFOL;  Surgeon: Dolores Frame, MD;  [facility];   Service: Gastroenterology;  Laterality: N/A;  1200   ESOPHAGOGASTRODUODENOSCOPY (EGD) WITH PROPOFOL N/A 02/28/2020   Procedure: ESOPHAGOGASTRODUODENOSCOPY (EGD) WITH PROPOFOL;  Surgeon: Malissa Hippo, MD;  [facility];  Service: Endoscopy;  Laterality: N/A;  2:45   HIP FRACTURE SURGERY     05/2009 left -[facility]   HYSTEROSCOPY WITH D & C  05/24/2018   Procedure: DILATATION AND CURETTAGE /HYSTEROSCOPY (PROCEDURE # 2)/PAP SMEAR (PROCEDURE #1);  Surgeon: Tilda Burrow, MD;  Location: [facility];  Service: Gynecology;;   [name] DILATION N/A 01/26/2014   Procedure: Alvy Beal;  Surgeon: Malissa Hippo, MD;  [facility];  Service: Endoscopy;  Laterality: N/A;    FAMILY HISTORY Family History  Problem Relation Age of Onset   Heart failure Mother    Diabetes Mother    Hypertension Mother    Depression Mother    Other Father        stomach ulcers; abd aneursym   Hypertension Sister    Heart failure Sister    Diabetes Sister    Depression Sister    Diabetes Sister        prediabetes   Cancer Maternal Aunt        breast  The patient's father died at age 42 from rupture of an abdominal aortic aneurysm. The patient's mother died with heart failure in the setting of diabetes at age 81. The patient had no brothers, 2 sisters. One sister was diagnosed with breast cancer at age 56. Her  other sister has had complications from diabetes. One maternal aunt was diagnosed with breast cancer in her 92s   GYNECOLOGIC HISTORY:  No LMP recorded. Patient is postmenopausal. Menarche age 14, first live birth age 44, which the patient is aware increases the risk of breast cancer. She is GX P3. She does not quite recall when she went through the change of life but thinks he was in her 77s. She did not use hormone replacement.   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 Her husband "Leavy Cella" Reede, 83, was in Airline pilot. He passed away in Aug 10, 2018due to cancer  and various other medical problems. Son Trinna Post lives in [place] where he works in Armed forces training and education officer. Son Willaim Sheng lives in [place] and works in Magazine features editor. Son Cletis Athens lives in [place] and works as a Teaching laboratory technician. The patient has 12 grandchildren and 2 great-grandchildren. She is a Control and instrumentation engineer.    ADVANCED DIRECTIVES: Not addressed at 06/22/2016 meeting   HEALTH MAINTENANCE: Social History   Tobacco Use   Smoking status: Former    Packs/day: 0.50    Years: 2.00    Pack years: 1.00    Types: Cigarettes    Quit date: 01/15/1961    Years since quitting: 60.1   Smokeless tobacco: Never  Vaping Use   Vaping Use: Never used  Substance Use Topics   Alcohol use: Yes    Alcohol/week: 0.0 standard drinks    Comment: occasionally   Drug use: No     Colonoscopy: UTD/Rehman  PAP: 05/24/2018, normal  Bone density: 04/22/2016   Allergies  Allergen Reactions   Avelox [Moxifloxacin Hcl In Nacl] Other (See Comments)    Altered mental status    Current Outpatient Medications  Medication Sig Dispense Refill   ALPRAZolam (XANAX) 0.25 MG tablet Take 0.25 mg by mouth at bedtime.     ARIPiprazole (ABILIFY) 2 MG tablet Take 2 mg by mouth daily.     cyanocobalamin (,VITAMIN B-12,) 1000 MCG/ML injection Inject 1,000 mcg into the muscle every 30 (thirty) days.     DULoxetine (CYMBALTA) 60 MG capsule Take 120 mg by mouth daily.     furosemide (LASIX) 20 MG tablet Take 1 tablet (20 mg total) by mouth daily for 5 days. 5 tablet 0   HYDROcodone-acetaminophen (NORCO) 10-325 MG tablet Take 1 tablet by mouth 3 (three) times daily as needed for moderate pain.      linaclotide (LINZESS) 145 MCG CAPS capsule Take 1 capsule (145 mcg total) by mouth daily before breakfast. (Patient not taking: Reported on 12/31/2020) 30 capsule 5   losartan (COZAAR) 25 MG tablet Take 1 tablet (25 mg total) by mouth 2 (two) times daily. 180 tablet 3   mirtazapine (REMERON) 15 MG tablet Take 1 tablet (15 mg total) by mouth at  bedtime. (Patient not taking: Reported on 12/31/2020) 90 tablet 0   omeprazole (PRILOSEC) 20 MG capsule Take 1 capsule (20 mg total) by mouth 2 (two) times daily before a meal. 60 capsule 5   Polyethyl Glycol-Propyl Glycol (SYSTANE OP) Place 1 drop into both eyes daily as needed (for dry eyes).     psyllium (METAMUCIL SMOOTH TEXTURE) 58.6 % powder Take 1 packet by mouth at bedtime. (Patient not taking: Reported on 12/31/2020)  12   tamoxifen (NOLVADEX) 20 MG tablet TAKE ONE TABLET (20 MG TOTAL) BY MOUTH DAILY. 30 tablet 3   zolpidem (AMBIEN) 5 MG tablet Take 5 mg by mouth at bedtime as needed.  No current facility-administered medications for this visit.    OBJECTIVE: White woman in no acute distress  Vitals:   02/26/21 1514  BP: (!) 181/92  Pulse: (!) 101  Resp: 18  Temp: 97.9 F (36.6 C)  SpO2: 99%      Body mass index is 23.65 kg/m.    ECOG FS:1 - Symptomatic but completely ambulatory  Sclerae unicteric, EOMs intact Wearing a mask No cervical or supraclavicular adenopathy Lungs no rales or rhonchi Heart regular rate and rhythm Abd soft, nontender, positive bowel sounds MSK no focal spinal tenderness, no upper extremity lymphedema Neuro: nonfocal, well oriented (she was able to tell me that she was going to have a biopsy of the left breast and knew the date of the upcoming test), appropriate affect Breasts: The right breast is status post prior lumpectomy.  There is mild distortion of the contour 5 no suspicious findings.  I do not palpate a mass in the left breast and there are no skin or nipple changes of concern.  Both axillae are benign.  LAB RESULTS:  CMP     Component Value Date/Time   NA 128 (L) 02/26/2021 1453   NA 139 12/21/2016 0931   K 4.5 02/26/2021 1453   K 4.2 12/21/2016 0931   CL 96 (L) 02/26/2021 1453   CO2 24 02/26/2021 1453   CO2 28 12/21/2016 0931   GLUCOSE 98 02/26/2021 1453   GLUCOSE 86 12/21/2016 0931   BUN 11 02/26/2021 1453   BUN 14.1  12/21/2016 0931   CREATININE 0.94 02/26/2021 1453   CREATININE 0.9 12/21/2016 0931   CALCIUM 8.7 (L) 02/26/2021 1453   CALCIUM 8.8 12/21/2016 0931   PROT 7.1 02/26/2021 1453   PROT 7.0 12/21/2016 0931   ALBUMIN 4.1 02/26/2021 1453   ALBUMIN 3.7 12/21/2016 0931   AST 16 02/26/2021 1453   AST 15 12/21/2016 0931   ALT 10 02/26/2021 1453   ALT 8 12/21/2016 0931   ALKPHOS 54 02/26/2021 1453   ALKPHOS 62 12/21/2016 0931   BILITOT 0.5 02/26/2021 1453   BILITOT 0.40 12/21/2016 0931   GFRNONAA >60 02/26/2021 1453   GFRAA >60 12/05/2018 0242    No results found for: Dorene Ar, A1GS, A2GS, BETS, BETA2SER, GAMS, MSPIKE, SPEI  No results found for: Ron Parker, [facility]  Lab Results  Component Value Date   WBC 6.4 02/26/2021   NEUTROABS 4.3 02/26/2021   HGB 11.1 (L) 02/26/2021   HCT 32.6 (L) 02/26/2021   MCV 96.7 02/26/2021   PLT 238 02/26/2021      Chemistry      Component Value Date/Time   NA 128 (L) 02/26/2021 1453   NA 139 12/21/2016 0931   K 4.5 02/26/2021 1453   K 4.2 12/21/2016 0931   CL 96 (L) 02/26/2021 1453   CO2 24 02/26/2021 1453   CO2 28 12/21/2016 0931   BUN 11 02/26/2021 1453   BUN 14.1 12/21/2016 0931   CREATININE 0.94 02/26/2021 1453   CREATININE 0.9 12/21/2016 0931      Component Value Date/Time   CALCIUM 8.7 (L) 02/26/2021 1453   CALCIUM 8.8 12/21/2016 0931   ALKPHOS 54 02/26/2021 1453   ALKPHOS 62 12/21/2016 0931   AST 16 02/26/2021 1453   AST 15 12/21/2016 0931   ALT 10 02/26/2021 1453   ALT 8 12/21/2016 0931   BILITOT 0.5 02/26/2021 1453   BILITOT 0.40 12/21/2016 0931      No results found for: LABCA2  No components found for:  [id number]  No results for input(s): INR in the last 168 hours.  Urinalysis    Component Value Date/Time   COLORURINE YELLOW 05/17/2018 1314   APPEARANCEUR HAZY (A) 05/17/2018 1314   LABSPEC 1.017 05/17/2018 1314   PHURINE 5.0 05/17/2018 1314   GLUCOSEU NEGATIVE 05/17/2018 1314    HGBUR SMALL (A) 05/17/2018 1314   BILIRUBINUR NEGATIVE 05/17/2018 1314   KETONESUR NEGATIVE 05/17/2018 1314   PROTEINUR NEGATIVE 05/17/2018 1314   UROBILINOGEN 1.0 06/26/2014 1400   NITRITE NEGATIVE 05/17/2018 1314   LEUKOCYTESUR NEGATIVE 05/17/2018 1314    STUDIES: US BREAST LTD UNI LEFT INC AXILLA  Result Date: 02/20/2021 CLINICAL DATA:  80 year old female for annual follow-up. History of RIGHT breast cancer and lumpectomy in 2018. EXAM: DIGITAL DIAGNOSTIC BILATERAL MAMMOGRAM WITH TOMOSYNTHESIS AND CAD; ULTRASOUND LEFT BREAST LIMITED TECHNIQUE: Bilateral digital diagnostic mammography and breast tomosynthesis was performed. The images were evaluated with computer-aided detection.; Targeted ultrasound examination of the left breast was performed. COMPARISON:  Previous exam(s). ACR Breast Density Category c: The breast tissue is heterogeneously dense, which may obscure small masses. FINDINGS: Full field views of both breasts, a magnification view of the RIGHT lumpectomy site and spot compression views of the LEFT breast are performed. A new focal asymmetry with possible subtle distortion is identified within the within the UPPER-OUTER LEFT breast. Lumpectomy changes within the RIGHT breast are again noted. No other new or suspicious findings are noted within either breast. Targeted ultrasound is performed, showing no sonographic correlate to the focal asymmetry/possible subtle distortion within the UPPER-OUTER LEFT breast. No abnormal LEFT axillary lymph nodes are noted. IMPRESSION: 1. Focal asymmetry/possible distortion within the UPPER-OUTER LEFT breast, without sonographic correlate. Tissue sampling is recommended. 2. No abnormal appearing LEFT axillary lymph nodes. 3. RIGHT lumpectomy changes without suspicious RIGHT breast findings. RECOMMENDATION: 3D/stereotactic guided LEFT breast biopsy, which will be arranged. I have discussed the findings and recommendations with the patient. If applicable,  a reminder letter will be sent to the patient regarding the next appointment. BI-RADS CATEGORY  4: Suspicious. Electronically Signed   By: Harmon Pier M.D.   On: 02/20/2021 14:56  MM DIAG BREAST TOMO BILATERAL  Result Date: 02/20/2021 CLINICAL DATA:  80 year old female for annual follow-up. History of RIGHT breast cancer and lumpectomy in 2018. EXAM: DIGITAL DIAGNOSTIC BILATERAL MAMMOGRAM WITH TOMOSYNTHESIS AND CAD; ULTRASOUND LEFT BREAST LIMITED TECHNIQUE: Bilateral digital diagnostic mammography and breast tomosynthesis was performed. The images were evaluated with computer-aided detection.; Targeted ultrasound examination of the left breast was performed. COMPARISON:  Previous exam(s). ACR Breast Density Category c: The breast tissue is heterogeneously dense, which may obscure small masses. FINDINGS: Full field views of both breasts, a magnification view of the RIGHT lumpectomy site and spot compression views of the LEFT breast are performed. A new focal asymmetry with possible subtle distortion is identified within the within the UPPER-OUTER LEFT breast. Lumpectomy changes within the RIGHT breast are again noted. No other new or suspicious findings are noted within either breast. Targeted ultrasound is performed, showing no sonographic correlate to the focal asymmetry/possible subtle distortion within the UPPER-OUTER LEFT breast. No abnormal LEFT axillary lymph nodes are noted. IMPRESSION: 1. Focal asymmetry/possible distortion within the UPPER-OUTER LEFT breast, without sonographic correlate. Tissue sampling is recommended. 2. No abnormal appearing LEFT axillary lymph nodes. 3. RIGHT lumpectomy changes without suspicious RIGHT breast findings. RECOMMENDATION: 3D/stereotactic guided LEFT breast biopsy, which will be arranged. I have discussed the findings and recommendations with the patient. If applicable, a reminder letter will be  sent to the patient regarding the next appointment. BI-RADS CATEGORY  4:  Suspicious. Electronically Signed   By: Harmon Pier M.D.   On: 02/20/2021 14:56    ELIGIBLE FOR AVAILABLE RESEARCH PROTOCOL: no  ASSESSMENT: 80 y.o. [place] woman status post right breast upper outer quadrant biopsy 05/19/2016 for a clinical T1c N0, stage IA invasive lobular breast cancer, estrogen and progesterone receptor positive, HER-2 nonamplified, with an MIB-15%.   (1) Status post right lumpectomy and sentinel lymph node sampling 07/31/2016 for a pT2 pN0, stage IB invasive lobular carcinoma, grade 2, with negative margins  (2) adjuvant radiation  09/01/2016 to 09/29/2016  Site/dose:    1. The Right breast was treated to 42.5 Gy in 17 fractions at 2.5 Gy per fraction. 2. The Right breast was boosted to 7.5 Gy in 3 fractions at 2.5 Gy per fraction.    (3)  started tamoxifen 10/26/2016  (a) bone density 04/22/2016 shows a T score in the right femur of -2.0, right radius -2.4   (b) switched to anastrozole August 2019 because of concerns for vaginal wetness and hot flashes with tamoxifen  (c) switched back to tamoxifen June 2019 as the patient felt there was no improvement in prior issues and she would like to "strengthen her bones".   PLAN:  Marilyn Haas is now 4 and half years out from definitive surgery for her breast cancer with no evidence of disease recurrence.  This is very favorable.  She is tolerating tamoxifen well and the plan is to continue that for a total of 10 years which will take [place] to August 2023.  I am hopeful the left breast biopsy coming up next week will be benign.  If so then she will "graduate" next August as planned.  If it turns out to be positive then she will return to see [place] sometime likely mid January to discuss how to deal with that  She is interested in getting some equipment at home so she could exercise safely perhaps a stationary bike or treadmill.    She knows to call for any other issue that may develop before the next visit.  Total encounter time 20  minutes.*   Evanne Matsunaga, Valentino Hue, MD  02/26/21 3:36 PM Medical Oncology and Hematology [facility] [address] [place], [place] [address] Tel. [phone]    Fax. [phone]   I, Mickie Bail, am acting as scribe for Dr. Valentino Hue. Cicily Bonano.  I, Ruthann Cancer MD, have reviewed the above documentation for accuracy and completeness, and I agree with the above.   *Total Encounter Time as defined by the Centers for Medicare and Medicaid Services includes, in addition to the face-to-face time of a patient visit (documented in the note above) non-face-to-face time: obtaining and reviewing outside history, ordering and reviewing medications, tests or procedures, care coordination (communications with other health care professionals or caregivers) and documentation in the medical record.

## 2021-02-26 ENCOUNTER — Encounter: Payer: Self-pay | Admitting: Oncology

## 2021-02-26 ENCOUNTER — Other Ambulatory Visit: Payer: Self-pay

## 2021-02-26 ENCOUNTER — Other Ambulatory Visit: Payer: Self-pay | Admitting: *Deleted

## 2021-02-26 ENCOUNTER — Inpatient Hospital Stay (HOSPITAL_BASED_OUTPATIENT_CLINIC_OR_DEPARTMENT_OTHER): Payer: Medicare Other | Admitting: Oncology

## 2021-02-26 ENCOUNTER — Other Ambulatory Visit: Payer: Self-pay | Admitting: Oncology

## 2021-02-26 ENCOUNTER — Inpatient Hospital Stay: Payer: Medicare Other | Attending: Oncology

## 2021-02-26 VITALS — BP 181/92 | HR 101 | Temp 97.9°F | Resp 18 | Ht 65.0 in | Wt 142.1 lb

## 2021-02-26 DIAGNOSIS — Z79810 Long term (current) use of selective estrogen receptor modulators (SERMs): Secondary | ICD-10-CM | POA: Diagnosis not present

## 2021-02-26 DIAGNOSIS — Z170 Estrogen receptor positive status [ER+]: Secondary | ICD-10-CM

## 2021-02-26 DIAGNOSIS — Z780 Asymptomatic menopausal state: Secondary | ICD-10-CM | POA: Diagnosis not present

## 2021-02-26 DIAGNOSIS — C50411 Malignant neoplasm of upper-outer quadrant of right female breast: Secondary | ICD-10-CM

## 2021-02-26 DIAGNOSIS — Z803 Family history of malignant neoplasm of breast: Secondary | ICD-10-CM | POA: Diagnosis not present

## 2021-02-26 DIAGNOSIS — Z9221 Personal history of antineoplastic chemotherapy: Secondary | ICD-10-CM | POA: Diagnosis not present

## 2021-02-26 DIAGNOSIS — C50412 Malignant neoplasm of upper-outer quadrant of left female breast: Secondary | ICD-10-CM | POA: Insufficient documentation

## 2021-02-26 LAB — CMP (CANCER CENTER ONLY)
ALT: 10 U/L (ref 0–44)
AST: 16 U/L (ref 15–41)
Albumin: 4.1 g/dL (ref 3.5–5.0)
Alkaline Phosphatase: 54 U/L (ref 38–126)
Anion gap: 8 (ref 5–15)
BUN: 11 mg/dL (ref 8–23)
CO2: 24 mmol/L (ref 22–32)
Calcium: 8.7 mg/dL — ABNORMAL LOW (ref 8.9–10.3)
Chloride: 96 mmol/L — ABNORMAL LOW (ref 98–111)
Creatinine: 0.94 mg/dL (ref 0.44–1.00)
GFR, Estimated: >60 mL/min (ref >60)
Glucose, Bld: 98 mg/dL (ref 70–99)
Potassium: 4.5 mmol/L (ref 3.5–5.1)
Sodium: 128 mmol/L — ABNORMAL LOW (ref 135–145)
Total Bilirubin: 0.5 mg/dL (ref 0.3–1.2)
Total Protein: 7.1 g/dL (ref 6.5–8.1)

## 2021-02-26 LAB — CBC WITH DIFFERENTIAL (CANCER CENTER ONLY)
Abs Immature Granulocytes: 0.02 10*3/uL (ref 0.00–0.07)
Basophils Absolute: 0.1 10*3/uL (ref 0.0–0.1)
Basophils Relative: 1 %
Eosinophils Absolute: 0.1 10*3/uL (ref 0.0–0.5)
Eosinophils Relative: 1 %
HCT: 32.6 % — ABNORMAL LOW (ref 36.0–46.0)
Hemoglobin: 11.1 g/dL — ABNORMAL LOW (ref 12.0–15.0)
Immature Granulocytes: 0 %
Lymphocytes Relative: 21 %
Lymphs Abs: 1.3 10*3/uL (ref 0.7–4.0)
MCH: 32.9 pg (ref 26.0–34.0)
MCHC: 34 g/dL (ref 30.0–36.0)
MCV: 96.7 fL (ref 80.0–100.0)
Monocytes Absolute: 0.7 10*3/uL (ref 0.1–1.0)
Monocytes Relative: 11 %
Neutro Abs: 4.3 10*3/uL (ref 1.7–7.7)
Neutrophils Relative %: 66 %
Platelet Count: 238 10*3/uL (ref 150–400)
RBC: 3.37 MIL/uL — ABNORMAL LOW (ref 3.87–5.11)
RDW: 11.9 % (ref 11.5–15.5)
WBC Count: 6.4 10*3/uL (ref 4.0–10.5)
nRBC: 0 % (ref 0.0–0.2)

## 2021-02-26 NOTE — Progress Notes (Signed)
[place] is sodium and chloride were slightly low.  I have let her know and also sent a note to Dr. Gerarda Fraction in case he wants to change herdiuretics

## 2021-02-27 ENCOUNTER — Telehealth: Payer: Self-pay | Admitting: Oncology

## 2021-02-27 NOTE — Telephone Encounter (Signed)
Scheduled appointment per 12/14 los. Left message.

## 2021-03-05 ENCOUNTER — Other Ambulatory Visit: Payer: Self-pay | Admitting: Internal Medicine

## 2021-03-05 DIAGNOSIS — R928 Other abnormal and inconclusive findings on diagnostic imaging of breast: Secondary | ICD-10-CM

## 2021-03-06 ENCOUNTER — Other Ambulatory Visit: Payer: Self-pay

## 2021-03-06 ENCOUNTER — Ambulatory Visit
Admission: RE | Admit: 2021-03-06 | Discharge: 2021-03-06 | Disposition: A | Discharge status: Home / Self Care | Payer: Medicare Other | Source: Ambulatory Visit | Attending: Internal Medicine | Admitting: Internal Medicine

## 2021-03-06 DIAGNOSIS — R928 Other abnormal and inconclusive findings on diagnostic imaging of breast: Secondary | ICD-10-CM

## 2021-03-06 DIAGNOSIS — N6092 Unspecified benign mammary dysplasia of left breast: Secondary | ICD-10-CM | POA: Diagnosis not present

## 2021-03-24 DIAGNOSIS — Z6824 Body mass index (BMI) 24.0-24.9, adult: Secondary | ICD-10-CM | POA: Diagnosis not present

## 2021-03-24 DIAGNOSIS — E538 Deficiency of other specified B group vitamins: Secondary | ICD-10-CM | POA: Diagnosis not present

## 2021-03-24 DIAGNOSIS — Z0001 Encounter for general adult medical examination with abnormal findings: Secondary | ICD-10-CM | POA: Diagnosis not present

## 2021-03-24 DIAGNOSIS — F5101 Primary insomnia: Secondary | ICD-10-CM | POA: Diagnosis not present

## 2021-03-24 DIAGNOSIS — M150 Primary generalized (osteo)arthritis: Secondary | ICD-10-CM | POA: Diagnosis not present

## 2021-03-24 DIAGNOSIS — D649 Anemia, unspecified: Secondary | ICD-10-CM | POA: Diagnosis not present

## 2021-03-24 DIAGNOSIS — Z9229 Personal history of other drug therapy: Secondary | ICD-10-CM | POA: Diagnosis not present

## 2021-03-24 DIAGNOSIS — Z1329 Encounter for screening for other suspected endocrine disorder: Secondary | ICD-10-CM | POA: Diagnosis not present

## 2021-03-24 DIAGNOSIS — Z13220 Encounter for screening for lipoid disorders: Secondary | ICD-10-CM | POA: Diagnosis not present

## 2021-03-24 DIAGNOSIS — G894 Chronic pain syndrome: Secondary | ICD-10-CM | POA: Diagnosis not present

## 2021-03-24 DIAGNOSIS — I10 Essential (primary) hypertension: Secondary | ICD-10-CM | POA: Diagnosis not present

## 2021-03-24 DIAGNOSIS — F419 Anxiety disorder, unspecified: Secondary | ICD-10-CM | POA: Diagnosis not present

## 2021-03-24 DIAGNOSIS — E663 Overweight: Secondary | ICD-10-CM | POA: Diagnosis not present

## 2021-03-24 DIAGNOSIS — E568 Deficiency of other vitamins: Secondary | ICD-10-CM | POA: Diagnosis not present

## 2021-03-28 DIAGNOSIS — E871 Hypo-osmolality and hyponatremia: Secondary | ICD-10-CM | POA: Diagnosis not present

## 2021-03-31 ENCOUNTER — Ambulatory Visit: Payer: Self-pay | Admitting: Surgery

## 2021-03-31 DIAGNOSIS — N6092 Unspecified benign mammary dysplasia of left breast: Secondary | ICD-10-CM | POA: Diagnosis not present

## 2021-03-31 DIAGNOSIS — Z853 Personal history of malignant neoplasm of breast: Secondary | ICD-10-CM | POA: Diagnosis not present

## 2021-04-02 ENCOUNTER — Other Ambulatory Visit: Payer: Self-pay | Admitting: Surgery

## 2021-04-02 DIAGNOSIS — N6092 Unspecified benign mammary dysplasia of left breast: Secondary | ICD-10-CM

## 2021-04-21 ENCOUNTER — Ambulatory Visit (INDEPENDENT_AMBULATORY_CARE_PROVIDER_SITE_OTHER): Payer: Medicare Other | Admitting: Cardiology

## 2021-04-21 ENCOUNTER — Other Ambulatory Visit: Payer: Self-pay | Admitting: *Deleted

## 2021-04-21 ENCOUNTER — Other Ambulatory Visit: Payer: Self-pay

## 2021-04-21 ENCOUNTER — Encounter: Payer: Self-pay | Admitting: Cardiology

## 2021-04-21 VITALS — BP 140/80 | HR 93 | Ht 65.0 in | Wt 153.4 lb

## 2021-04-21 DIAGNOSIS — I493 Ventricular premature depolarization: Secondary | ICD-10-CM | POA: Diagnosis not present

## 2021-04-21 DIAGNOSIS — R600 Localized edema: Secondary | ICD-10-CM | POA: Diagnosis not present

## 2021-04-21 DIAGNOSIS — I10 Essential (primary) hypertension: Secondary | ICD-10-CM

## 2021-04-21 MED ORDER — TAMOXIFEN CITRATE 20 MG PO TABS: 20.0000 mg | ORAL_TABLET | Freq: Every day | ORAL | 1 refills | Status: DC

## 2021-04-21 NOTE — Progress Notes (Signed)
Cardiology Office Note  Date: 04/21/2021   ID: Marilyn, Haas February 26, 1941, MRN [id number]  PCP:  Redmond School, MD  Cardiologist:  Rozann Lesches, MD Electrophysiologist:  None   Chief Complaint  Patient presents with   Cardiac follow-up    History of Present Illness: Marilyn Haas is an 81 y.o. female last seen in August 2022.  She is here with her son for a follow-up visit.  Reports having trouble with lower leg swelling and scaly skin, was prescribed Lasix recently by her PCP.  She did tolerate transition from lisinopril to losartan which is now at 25 mg twice daily.  She does not report any sense of palpitations, PVCs have been fairly quiescent over the last few visits.  No unexplained syncope.  I reviewed her medications which are noted below.  She continues to follow at [facility].  I reviewed her lab work from December 2022 as noted below.  Past Medical History:  Diagnosis Date   Anemia    Anxiety    Arthritis    Breast cancer ([place]) 05/19/2016   Right breast   Childhood asthma    Chronic back pain    Scoliosis, stenosis   Depression    Diverticulosis    GERD (gastroesophageal reflux disease)    Hard of hearing    Headache    History of blood transfusion    History of colon polyps    History of hiatal hernia    History of pneumonia    Insomnia    Osteopenia    Osteoporosis    Personal history of radiation therapy    Thickened endometrium 09/29/2017    Past Surgical History:  Procedure Laterality Date   BREAST LUMPECTOMY WITH RADIOACTIVE SEED AND SENTINEL LYMPH NODE BIOPSY Right 07/31/2016   Procedure: BREAST LUMPECTOMY WITH RADIOACTIVE SEED AND SENTINEL LYMPH NODE BIOPSY, INJECT BLUE DYE RIGHT BREAST;  Surgeon: Fanny Skates, MD;  Location: [facility];  Service: General;  Laterality: Right;   COLONOSCOPY     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 07/04/2014   Procedure: LEFT CONVERSION TO TOTAL HIP ARTHROPLASTY;  Surgeon: Gaynelle Arabian, MD;  [facility]: [facility];   Service: Orthopedics;  Laterality: Left;   ESOPHAGEAL DILATION N/A 09/24/2017   Procedure: ESOPHAGEAL DILATION;  Surgeon: Rogene Houston, MD;  [facility];  Service: Endoscopy;  Laterality: N/A;   ESOPHAGEAL DILATION N/A 01/19/2020   Procedure: ESOPHAGEAL DILATION;  Surgeon: Harvel Quale, MD;  [facility];  Service: Gastroenterology;  Laterality: N/A;   ESOPHAGEAL DILATION  02/28/2020   Procedure: ESOPHAGEAL DILATION;  Surgeon: Rogene Houston, MD;  [facility];  Service: Endoscopy;;   ESOPHAGOGASTRODUODENOSCOPY N/A 01/26/2014   Procedure: ESOPHAGOGASTRODUODENOSCOPY (EGD);  Surgeon: Rogene Houston, MD;  [facility];  Service: Endoscopy;  Laterality: N/A;  1030   ESOPHAGOGASTRODUODENOSCOPY (EGD) WITH PROPOFOL N/A 09/24/2017   Procedure: ESOPHAGOGASTRODUODENOSCOPY (EGD) WITH PROPOFOL;  Surgeon: Rogene Houston, MD;  [facility];  Service: Endoscopy;  Laterality: N/A;  11:15   ESOPHAGOGASTRODUODENOSCOPY (EGD) WITH PROPOFOL N/A 01/19/2020   Procedure: ESOPHAGOGASTRODUODENOSCOPY (EGD) WITH PROPOFOL;  Surgeon: Harvel Quale, MD;  [facility];  Service: Gastroenterology;  Laterality: N/A;  1200   ESOPHAGOGASTRODUODENOSCOPY (EGD) WITH PROPOFOL N/A 02/28/2020   Procedure: ESOPHAGOGASTRODUODENOSCOPY (EGD) WITH PROPOFOL;  Surgeon: Rogene Houston, MD;  [facility];  Service: Endoscopy;  Laterality: N/A;  2:45   HIP FRACTURE SURGERY     05/2009  left -[place]   HYSTEROSCOPY WITH D & C  05/24/2018   Procedure: DILATATION AND CURETTAGE /HYSTEROSCOPY (PROCEDURE # 2)/PAP SMEAR (PROCEDURE #1);  Surgeon: Jonnie Kind, MD;  [facility];  Service: Gynecology;;   Venia Minks DILATION N/A 01/26/2014   Procedure: Keturah Shavers;  Surgeon: Rogene Houston, MD;  [facility];  Service: Endoscopy;  Laterality: N/A;    Current Outpatient Medications  Medication Sig Dispense Refill   ALPRAZolam  (XANAX) 0.25 MG tablet Take 0.25 mg by mouth at bedtime.     ARIPiprazole (ABILIFY) 2 MG tablet Take 2 mg by mouth daily.     cyanocobalamin (,VITAMIN B-12,) 1000 MCG/ML injection Inject 1,000 mcg into the muscle every 30 (thirty) days.     DULoxetine (CYMBALTA) 60 MG capsule Take 120 mg by mouth daily.     furosemide (LASIX) 20 MG tablet Take 1 tablet (20 mg total) by mouth daily for 5 days. 5 tablet 0   HYDROcodone-acetaminophen (NORCO) 10-325 MG tablet Take 1 tablet by mouth 3 (three) times daily as needed for moderate pain.      losartan (COZAAR) 25 MG tablet Take 1 tablet (25 mg total) by mouth 2 (two) times daily. 180 tablet 3   omeprazole (PRILOSEC) 20 MG capsule Take 1 capsule (20 mg total) by mouth 2 (two) times daily before a meal. 60 capsule 5   Polyethyl Glycol-Propyl Glycol (SYSTANE OP) Place 1 drop into both eyes daily as needed (for dry eyes).     psyllium (METAMUCIL SMOOTH TEXTURE) 58.6 % powder Take 1 packet by mouth at bedtime.  12   tamoxifen (NOLVADEX) 20 MG tablet Take 1 tablet (20 mg total) by mouth daily. 90 tablet 1   zolpidem (AMBIEN) 5 MG tablet Take 5 mg by mouth at bedtime as needed.     linaclotide (LINZESS) 145 MCG CAPS capsule Take 1 capsule (145 mcg total) by mouth daily before breakfast. (Patient not taking: Reported on 12/31/2020) 30 capsule 5   mirtazapine (REMERON) 15 MG tablet Take 1 tablet (15 mg total) by mouth at bedtime. (Patient not taking: Reported on 12/31/2020) 90 tablet 0   No current facility-administered medications for this visit.   Allergies:  Avelox [moxifloxacin hcl in nacl]   ROS: Hearing loss.  Physical Exam: VS:  BP 140/80    Pulse 93    Ht 5\' 5"  (1.651 m)    Wt 153 lb 6.4 oz (69.6 kg)    SpO2 97%    BMI 25.53 kg/m , BMI Body mass index is 25.53 kg/m.  Wt Readings from Last 3 Encounters:  04/21/21 153 lb 6.4 oz (69.6 kg)  02/26/21 142 lb 1.6 oz (64.5 kg)  11/12/20 132 lb 6.4 oz (60.1 kg)    General: Patient appears comfortable at  rest. HEENT: Conjunctiva and lids normal, wearing a mask.. Neck: Supple, no elevated JVP or carotid bruits. Lungs: Clear to auscultation, nonlabored breathing at rest. Cardiac: Regular rate and rhythm, S4, 1/6 systolic murmur. Extremities: Mild lower leg edema and stasis.  ECG:  An ECG dated 11/12/2020 was personally reviewed today and demonstrated:  Sinus rhythm.  Recent Labwork: 02/26/2021: ALT 10; AST 16; BUN 11; Creatinine 0.94; Hemoglobin 11.1; Platelet Count 238; Potassium 4.5; Sodium 128   Other Studies Reviewed Today:  Echocardiogram 02/18/2016: - Left ventricle: The cavity size was normal. Wall thickness was    increased in a pattern of mild LVH. Systolic function was normal.    The estimated ejection fraction was in the  range of 55% to 60%.    Wall motion was normal; there were no regional wall motion    abnormalities. Doppler parameters are consistent with abnormal    left ventricular relaxation (grade 1 diastolic dysfunction).  - Aortic valve: Mildly calcified annulus. Trileaflet. There was    mild regurgitation.  - Mitral valve: There was mild regurgitation.  - Right atrium: Central venous pressure (est): 3 mm Hg.  - Atrial septum: No defect or patent foramen ovale was identified.  - Tricuspid valve: There was mild regurgitation.  - Pulmonary arteries: PA peak pressure: 22 mm Hg (S).   Impressions:   - Mild LVH with LVEF 55-60% and grade 1 diastolic dysfunction. Mild    mitral regurgitation. Mildly calcified aortic annulus with mild    aortic regurgitation. Mild tricuspid regurgitation with normal    estimated PASP 22 mmHg.   Assessment and Plan:  1.  Essential hypertension.  Currently on losartan 25 mg twice daily.  Continue to track blood pressure trend at home, there is room for further up titration if necessary.  2.  Leg edema, recently prescribed Lasix by PCP.  Would take for the next few days and then use on an as-needed basis.  If remains a recurring issue,  could consider addition of diuretic to her antihypertensive regimen.  Otherwise would use Lasix as needed.  3.  History of PVCs, no active palpitations, heart rate regular today.  Medication Adjustments/Labs and Tests Ordered: Current medicines are reviewed at length with the patient today.  Concerns regarding medicines are outlined above.   Tests Ordered: No orders of the defined types were placed in this encounter.   Medication Changes: No orders of the defined types were placed in this encounter.   Disposition:  Follow up  6 months.  Signed, Satira Sark, MD, [facility] 04/21/2021 4:02 PM    [facility] [address], [place], [place] [address] Phone: [phone]; Fax: [phone]

## 2021-04-21 NOTE — Patient Instructions (Signed)
Medication Instructions:  Your physician recommends that you continue on your current medications as directed. Please refer to the Current Medication list given to you today.   Labwork: None today  Testing/Procedures: None today  Follow-Up: 6 months  Any Other Special Instructions Will Be Listed Below (If Applicable).  If you need a refill on your cardiac medications before your next appointment, please call your pharmacy.  

## 2021-04-24 DIAGNOSIS — T8484XA Pain due to internal orthopedic prosthetic devices, implants and grafts, initial encounter: Secondary | ICD-10-CM | POA: Diagnosis not present

## 2021-04-24 DIAGNOSIS — C50919 Malignant neoplasm of unspecified site of unspecified female breast: Secondary | ICD-10-CM | POA: Diagnosis not present

## 2021-04-24 DIAGNOSIS — G894 Chronic pain syndrome: Secondary | ICD-10-CM | POA: Diagnosis not present

## 2021-04-24 DIAGNOSIS — M150 Primary generalized (osteo)arthritis: Secondary | ICD-10-CM | POA: Diagnosis not present

## 2021-04-29 ENCOUNTER — Encounter (HOSPITAL_BASED_OUTPATIENT_CLINIC_OR_DEPARTMENT_OTHER): Payer: Self-pay | Admitting: Surgery

## 2021-04-29 ENCOUNTER — Other Ambulatory Visit: Payer: Self-pay

## 2021-05-06 ENCOUNTER — Other Ambulatory Visit: Payer: Self-pay

## 2021-05-06 ENCOUNTER — Ambulatory Visit
Admission: RE | Admit: 2021-05-06 | Discharge: 2021-05-06 | Disposition: A | Discharge status: Home / Self Care | Payer: Medicare Other | Source: Ambulatory Visit | Attending: Surgery | Admitting: Surgery

## 2021-05-06 ENCOUNTER — Telehealth: Payer: Self-pay | Admitting: *Deleted

## 2021-05-06 DIAGNOSIS — R928 Other abnormal and inconclusive findings on diagnostic imaging of breast: Secondary | ICD-10-CM | POA: Diagnosis not present

## 2021-05-06 DIAGNOSIS — N6092 Unspecified benign mammary dysplasia of left breast: Secondary | ICD-10-CM

## 2021-05-06 NOTE — Progress Notes (Addendum)
Sent text reminding pt to come and pick up pre surgery drink and a soap.  Surgical soap given with instructions, pt verbalized understanding. Enhanced Recovery after Surgery  Enhanced Recovery after Surgery is a protocol used to improve the stress on your body and your recovery after surgery.  Patient Instructions  The night before surgery:  No food after midnight. ONLY clear liquids after midnight  The day of surgery (if you do NOT have diabetes):  Drink ONE (1) Pre-Surgery Clear Ensure as directed.   This drink was given to you during your hospital  pre-op appointment visit. The pre-op nurse will instruct you on the time to drink the  Pre-Surgery Ensure depending on your surgery time. Finish the drink at the designated time by the pre-op nurse.  Nothing else to drink after completing the  Pre-Surgery Clear Ensure.  The day of surgery (if you have diabetes): Drink ONE (1) Gatorade 2 (G2) as directed. This drink was given to you during your hospital  pre-op appointment visit.  The pre-op nurse will instruct you on the time to drink the   Gatorade 2 (G2) depending on your surgery time. Color of the Gatorade may vary. Red is not allowed. Nothing else to drink after completing the  Gatorade 2 (G2).         If office.you have questions, please contact your surgeons office

## 2021-05-06 NOTE — Telephone Encounter (Signed)
This RN spoke with the pt's son- [name]- per his call wanting to inquire " do we continue the tamoxifen after she has the surgery and when do we come back to see the doctor in your office ?"  Note pt was initially diagnosed in 2018 with right  breast cancer- and started on antiestrogens.  At visit with MD in December - pt has new area in Left breast and is scheduled for a lumpectomy tomorrow.  This RN informed informed [name] pt was started on the tamoxifen for her diagnosis in 2018- she should hold taking the tamoxifen presently for surgery and then Dr Brantley Stage can advise her when to resume.  Informed [name] - presently pt is scheduled for followup in August of this year- unless surgery reveals any new information then the biopsy showed.  Billy verbalized understanding of plan.  This note will be forwarded to surgeon for review of communication.

## 2021-05-07 ENCOUNTER — Encounter (HOSPITAL_BASED_OUTPATIENT_CLINIC_OR_DEPARTMENT_OTHER): Payer: Self-pay | Admitting: Surgery

## 2021-05-07 ENCOUNTER — Encounter (HOSPITAL_BASED_OUTPATIENT_CLINIC_OR_DEPARTMENT_OTHER)
Admission: RE | Disposition: A | Discharge status: Home / Self Care | Payer: Self-pay | Source: Home / Self Care | Attending: Surgery

## 2021-05-07 ENCOUNTER — Ambulatory Visit
Admission: RE | Admit: 2021-05-07 | Discharge: 2021-05-07 | Disposition: A | Discharge status: Home / Self Care | Payer: Medicare Other | Source: Ambulatory Visit | Attending: Surgery | Admitting: Surgery

## 2021-05-07 ENCOUNTER — Ambulatory Visit (HOSPITAL_BASED_OUTPATIENT_CLINIC_OR_DEPARTMENT_OTHER): Payer: Medicare Other | Admitting: Anesthesiology

## 2021-05-07 ENCOUNTER — Ambulatory Visit (HOSPITAL_BASED_OUTPATIENT_CLINIC_OR_DEPARTMENT_OTHER)
Admission: RE | Admit: 2021-05-07 | Discharge: 2021-05-07 | Disposition: A | Discharge status: Home / Self Care | Payer: Medicare Other | Attending: Surgery | Admitting: Surgery

## 2021-05-07 ENCOUNTER — Other Ambulatory Visit: Payer: Self-pay

## 2021-05-07 DIAGNOSIS — K449 Diaphragmatic hernia without obstruction or gangrene: Secondary | ICD-10-CM

## 2021-05-07 DIAGNOSIS — K219 Gastro-esophageal reflux disease without esophagitis: Secondary | ICD-10-CM | POA: Diagnosis not present

## 2021-05-07 DIAGNOSIS — F32A Depression, unspecified: Secondary | ICD-10-CM | POA: Insufficient documentation

## 2021-05-07 DIAGNOSIS — N6032 Fibrosclerosis of left breast: Secondary | ICD-10-CM | POA: Diagnosis not present

## 2021-05-07 DIAGNOSIS — R920 Mammographic microcalcification found on diagnostic imaging of breast: Secondary | ICD-10-CM | POA: Diagnosis not present

## 2021-05-07 DIAGNOSIS — Z79810 Long term (current) use of selective estrogen receptor modulators (SERMs): Secondary | ICD-10-CM | POA: Diagnosis not present

## 2021-05-07 DIAGNOSIS — N6082 Other benign mammary dysplasias of left breast: Secondary | ICD-10-CM | POA: Diagnosis not present

## 2021-05-07 DIAGNOSIS — N6092 Unspecified benign mammary dysplasia of left breast: Secondary | ICD-10-CM | POA: Diagnosis not present

## 2021-05-07 DIAGNOSIS — F419 Anxiety disorder, unspecified: Secondary | ICD-10-CM | POA: Insufficient documentation

## 2021-05-07 DIAGNOSIS — Z87891 Personal history of nicotine dependence: Secondary | ICD-10-CM | POA: Diagnosis not present

## 2021-05-07 DIAGNOSIS — F418 Other specified anxiety disorders: Secondary | ICD-10-CM | POA: Diagnosis not present

## 2021-05-07 DIAGNOSIS — R928 Other abnormal and inconclusive findings on diagnostic imaging of breast: Secondary | ICD-10-CM | POA: Diagnosis not present

## 2021-05-07 DIAGNOSIS — I10 Essential (primary) hypertension: Secondary | ICD-10-CM | POA: Diagnosis not present

## 2021-05-07 DIAGNOSIS — Z853 Personal history of malignant neoplasm of breast: Secondary | ICD-10-CM | POA: Diagnosis not present

## 2021-05-07 DIAGNOSIS — J45909 Unspecified asthma, uncomplicated: Secondary | ICD-10-CM | POA: Diagnosis not present

## 2021-05-07 HISTORY — PX: BREAST LUMPECTOMY WITH RADIOACTIVE SEED LOCALIZATION: SHX6424

## 2021-05-07 SURGERY — BREAST LUMPECTOMY WITH RADIOACTIVE SEED LOCALIZATION
Anesthesia: General | Site: Breast | Laterality: Left

## 2021-05-07 MED ORDER — CEFAZOLIN SODIUM-DEXTROSE 2-4 GM/100ML-% IV SOLN
2.0000 g | INTRAVENOUS | Status: AC
  Administered 2021-05-07: 2 g via INTRAVENOUS

## 2021-05-07 MED ORDER — FENTANYL CITRATE (PF) 100 MCG/2ML IJ SOLN
INTRAMUSCULAR | Status: DC | PRN
  Administered 2021-05-07: 100 ug via INTRAVENOUS

## 2021-05-07 MED ORDER — PROPOFOL 10 MG/ML IV BOLUS
INTRAVENOUS | Status: DC | PRN
  Administered 2021-05-07: 150 mg via INTRAVENOUS

## 2021-05-07 MED ORDER — BUPIVACAINE-EPINEPHRINE (PF) 0.25% -1:200000 IJ SOLN
INTRAMUSCULAR | Status: DC | PRN
  Administered 2021-05-07: 20 mL via PERINEURAL

## 2021-05-07 MED ORDER — ONDANSETRON HCL 4 MG/2ML IJ SOLN
INTRAMUSCULAR | Status: DC | PRN
  Administered 2021-05-07: 4 mg via INTRAVENOUS

## 2021-05-07 MED ORDER — OXYCODONE HCL 5 MG PO TABS: 5.0000 mg | ORAL_TABLET | Freq: Once | ORAL | Status: DC | PRN

## 2021-05-07 MED ORDER — PHENYLEPHRINE 40 MCG/ML (10ML) SYRINGE FOR IV PUSH (FOR BLOOD PRESSURE SUPPORT)
PREFILLED_SYRINGE | INTRAVENOUS | Status: AC
  Filled 2021-05-07: qty 10

## 2021-05-07 MED ORDER — OXYCODONE HCL 5 MG PO TABS: 5.0000 mg | ORAL_TABLET | Freq: Four times a day (QID) | ORAL | 0 refills | Status: DC | PRN

## 2021-05-07 MED ORDER — LACTATED RINGERS IV SOLN: INTRAVENOUS | Status: DC

## 2021-05-07 MED ORDER — HYDROMORPHONE HCL 1 MG/ML IJ SOLN: 0.2500 mg | INTRAMUSCULAR | Status: DC | PRN

## 2021-05-07 MED ORDER — SODIUM CHLORIDE 0.9 % IV SOLN
INTRAVENOUS | Status: AC
  Filled 2021-05-07: qty 10

## 2021-05-07 MED ORDER — CHLORHEXIDINE GLUCONATE CLOTH 2 % EX PADS: 6.0000 | MEDICATED_PAD | Freq: Once | CUTANEOUS | Status: DC

## 2021-05-07 MED ORDER — CEFAZOLIN SODIUM-DEXTROSE 2-4 GM/100ML-% IV SOLN
INTRAVENOUS | Status: AC
  Filled 2021-05-07: qty 100

## 2021-05-07 MED ORDER — PHENYLEPHRINE HCL (PRESSORS) 10 MG/ML IV SOLN
INTRAVENOUS | Status: DC | PRN
  Administered 2021-05-07: 100 ug via INTRAVENOUS

## 2021-05-07 MED ORDER — PROPOFOL 10 MG/ML IV BOLUS
INTRAVENOUS | Status: AC
  Filled 2021-05-07: qty 20

## 2021-05-07 MED ORDER — LIDOCAINE HCL (CARDIAC) PF 100 MG/5ML IV SOSY
PREFILLED_SYRINGE | INTRAVENOUS | Status: DC | PRN
  Administered 2021-05-07: 50 mg via INTRAVENOUS

## 2021-05-07 MED ORDER — FENTANYL CITRATE (PF) 100 MCG/2ML IJ SOLN
INTRAMUSCULAR | Status: AC
  Filled 2021-05-07: qty 2

## 2021-05-07 MED ORDER — OXYCODONE HCL 5 MG/5ML PO SOLN: 5.0000 mg | Freq: Once | ORAL | Status: DC | PRN

## 2021-05-07 MED ORDER — AMISULPRIDE (ANTIEMETIC) 5 MG/2ML IV SOLN: 10.0000 mg | Freq: Once | INTRAVENOUS | Status: DC | PRN

## 2021-05-07 MED ORDER — PROMETHAZINE HCL 25 MG/ML IJ SOLN: 6.2500 mg | INTRAMUSCULAR | Status: DC | PRN

## 2021-05-07 MED ORDER — SODIUM CHLORIDE 0.9 % IV SOLN
INTRAVENOUS | Status: DC | PRN
  Administered 2021-05-07: 200 mL

## 2021-05-07 SURGICAL SUPPLY — 51 items
ADH SKN CLS APL DERMABOND .7 (GAUZE/BANDAGES/DRESSINGS) ×1
APL PRP STRL LF DISP 70% ISPRP (MISCELLANEOUS) ×1
APPLIER CLIP 9.375 MED OPEN (MISCELLANEOUS)
APR CLP MED 9.3 20 MLT OPN (MISCELLANEOUS)
BINDER BREAST LRG (GAUZE/BANDAGES/DRESSINGS) IMPLANT
BINDER BREAST MEDIUM (GAUZE/BANDAGES/DRESSINGS) IMPLANT
BINDER BREAST XLRG (GAUZE/BANDAGES/DRESSINGS) ×1 IMPLANT
BINDER BREAST XXLRG (GAUZE/BANDAGES/DRESSINGS) IMPLANT
BLADE SURG 15 STRL LF DISP TIS (BLADE) ×1 IMPLANT
BLADE SURG 15 STRL SS (BLADE) ×2
CANISTER SUC SOCK COL 7IN (MISCELLANEOUS) IMPLANT
CANISTER SUCT 1200ML W/VALVE (MISCELLANEOUS) IMPLANT
CHLORAPREP W/TINT 26 (MISCELLANEOUS) ×2 IMPLANT
CLIP APPLIE 9.375 MED OPEN (MISCELLANEOUS) IMPLANT
COVER BACK TABLE 60X90IN (DRAPES) ×2 IMPLANT
COVER MAYO STAND STRL (DRAPES) ×2 IMPLANT
COVER PROBE W GEL 5X96 (DRAPES) ×2 IMPLANT
DERMABOND ADVANCED (GAUZE/BANDAGES/DRESSINGS) ×1
DERMABOND ADVANCED .7 DNX12 (GAUZE/BANDAGES/DRESSINGS) ×1 IMPLANT
DRAPE LAPAROSCOPIC ABDOMINAL (DRAPES) IMPLANT
DRAPE LAPAROTOMY 100X72 PEDS (DRAPES) ×2 IMPLANT
DRAPE UTILITY XL STRL (DRAPES) ×2 IMPLANT
ELECT COATED BLADE 2.86 ST (ELECTRODE) ×2 IMPLANT
ELECT REM PT RETURN 9FT ADLT (ELECTROSURGICAL) ×2
ELECTRODE REM PT RTRN 9FT ADLT (ELECTROSURGICAL) ×1 IMPLANT
GLOVE SRG 8 PF TXTR STRL LF DI (GLOVE) ×1 IMPLANT
GLOVE SURG LTX SZ8 (GLOVE) ×2 IMPLANT
GLOVE SURG UNDER POLY LF SZ8 (GLOVE) ×2
GOWN STRL REUS W/ TWL LRG LVL3 (GOWN DISPOSABLE) ×2 IMPLANT
GOWN STRL REUS W/ TWL XL LVL3 (GOWN DISPOSABLE) ×1 IMPLANT
GOWN STRL REUS W/TWL LRG LVL3 (GOWN DISPOSABLE)
GOWN STRL REUS W/TWL XL LVL3 (GOWN DISPOSABLE) ×4
HEMOSTAT ARISTA ABSORB 3G PWDR (HEMOSTASIS) IMPLANT
HEMOSTAT SNOW SURGICEL 2X4 (HEMOSTASIS) IMPLANT
KIT MARKER MARGIN INK (KITS) ×2 IMPLANT
NDL HYPO 25X1 1.5 SAFETY (NEEDLE) ×1 IMPLANT
NEEDLE HYPO 25X1 1.5 SAFETY (NEEDLE) ×2 IMPLANT
NS IRRIG 1000ML POUR BTL (IV SOLUTION) ×2 IMPLANT
PACK BASIN DAY SURGERY FS (CUSTOM PROCEDURE TRAY) ×2 IMPLANT
PENCIL SMOKE EVACUATOR (MISCELLANEOUS) ×2 IMPLANT
SLEEVE SCD COMPRESS KNEE MED (STOCKING) ×2 IMPLANT
SPIKE FLUID TRANSFER (MISCELLANEOUS) IMPLANT
SPONGE T-LAP 4X18 ~~LOC~~+RFID (SPONGE) ×2 IMPLANT
SUT MNCRL AB 4-0 PS2 18 (SUTURE) ×2 IMPLANT
SUT SILK 2 0 SH (SUTURE) IMPLANT
SUT VICRYL 3-0 CR8 SH (SUTURE) ×2 IMPLANT
SYR CONTROL 10ML LL (SYRINGE) ×2 IMPLANT
TOWEL GREEN STERILE FF (TOWEL DISPOSABLE) ×2 IMPLANT
TRAY FAXITRON CT DISP (TRAY / TRAY PROCEDURE) ×2 IMPLANT
TUBE CONNECTING 20X1/4 (TUBING) ×1 IMPLANT
YANKAUER SUCT BULB TIP NO VENT (SUCTIONS) ×1 IMPLANT

## 2021-05-07 NOTE — Discharge Instructions (Addendum)
Post Anesthesia Home Care Instructions  Activity: Get plenty of rest for the remainder of the day. A responsible individual must stay with you for 24 hours following the procedure.  For the next 24 hours, DO NOT: -Drive a car -Paediatric nurse -Drink alcoholic beverages -Take any medication unless instructed by your physician -Make any legal decisions or sign important papers.  Meals: Start with liquid foods such as gelatin or soup. Progress to regular foods as tolerated. Avoid greasy, spicy, heavy foods. If nausea and/or vomiting occur, drink only clear liquids until the nausea and/or vomiting subsides. Call your physician if vomiting continues.  Special Instructions/Symptoms: Your throat may feel dry or sore from the anesthesia or the breathing tube placed in your throat during surgery. If this causes discomfort, gargle with warm salt water. The discomfort should disappear within 24 hours.  If you had a scopolamine patch placed behind your ear for the management of post- operative nausea and/or vomiting:  1. The medication in the patch is effective for 72 hours, after which it should be removed.  Wrap patch in a tissue and discard in the trash. Wash hands thoroughly with soap and water. 2. You may remove the patch earlier than 72 hours if you experience unpleasant side effects which may include dry mouth, dizziness or visual disturbances. 3. Avoid touching the patch. Wash your hands with soap and water after contact with the patch.      [facility] Office Phone Number [phone]  BREAST BIOPSY/ PARTIAL MASTECTOMY: POST OP INSTRUCTIONS  Always review your discharge instruction sheet given to you by the facility where your surgery was performed.  IF YOU HAVE DISABILITY OR FAMILY LEAVE FORMS, YOU MUST BRING THEM TO THE OFFICE FOR PROCESSING.  DO NOT GIVE THEM TO YOUR DOCTOR.  A prescription for pain medication may be given to you upon discharge.  Take your pain  medication as prescribed, if needed.  If narcotic pain medicine is not needed, then you may take acetaminophen (Tylenol) or ibuprofen (Advil) as needed. Take your usually prescribed medications unless otherwise directed If you need a refill on your pain medication, please contact your pharmacy.  They will contact our office to request authorization.  Prescriptions will not be filled after 5pm or on week-ends. You should eat very light the first 24 hours after surgery, such as soup, crackers, pudding, etc.  Resume your normal diet the day after surgery. Most patients will experience some swelling and bruising in the breast.  Ice packs and a good support bra will help.  Swelling and bruising can take several days to resolve.  It is common to experience some constipation if taking pain medication after surgery.  Increasing fluid intake and taking a stool softener will usually help or prevent this problem from occurring.  A mild laxative (Milk of Magnesia or Miralax) should be taken according to package directions if there are no bowel movements after 48 hours. Unless discharge instructions indicate otherwise, you may remove your bandages 24-48 hours after surgery, and you may shower at that time.  You may have steri-strips (small skin tapes) in place directly over the incision.  These strips should be left on the skin for 7-10 days.  If your surgeon used skin glue on the incision, you may shower in 24 hours.  The glue will flake off over the next 2-3 weeks.  Any sutures or staples will be removed at the office during your follow-up visit. ACTIVITIES:  You may resume regular daily activities (  gradually increasing) beginning the next day.  Wearing a good support bra or sports bra minimizes pain and swelling.  You may have sexual intercourse when it is comfortable. You may drive when you no longer are taking prescription pain medication, you can comfortably wear a seatbelt, and you can safely maneuver your car and  apply brakes. RETURN TO WORK:  ______________________________________________________________________________________ Dennis Bast should see your doctor in the office for a follow-up appointment approximately two weeks after your surgery.  Your doctors nurse will typically make your follow-up appointment when she calls you with your pathology report.  Expect your pathology report 2-3 business days after your surgery.  You may call to check if you do not hear from [place] after three days. OTHER INSTRUCTIONS: _______________________________________________________________________________________________ _____________________________________________________________________________________________________________________________________ _____________________________________________________________________________________________________________________________________ _____________________________________________________________________________________________________________________________________  WHEN TO CALL YOUR DOCTOR: Fever over 101.0 Nausea and/or vomiting. Extreme swelling or bruising. Continued bleeding from incision. Increased pain, redness, or drainage from the incision.  The clinic staff is available to answer your questions during regular business hours.  Please dont hesitate to call and ask to speak to one of the nurses for clinical concerns.  If you have a medical emergency, go to the nearest emergency room or call 911.  A surgeon from [facility] is always on call at the hospital.  For further questions, please visit [email or web address]

## 2021-05-07 NOTE — Interval H&P Note (Signed)
History and Physical Interval Note:  05/07/2021 11:35 AM  [employer]  has presented today for surgery, with the diagnosis of LEFT BREAST ATYPICAL LOBULAR HYPERPLASIA.  The various methods of treatment have been discussed with the patient and family. After consideration of risks, benefits and other options for treatment, the patient has consented to  Procedure(s): LEFT BREAST LUMPECTOMY WITH RADIOACTIVE SEED LOCALIZATION (Left) as a surgical intervention.  The patient's history has been reviewed, patient examined, no change in status, stable for surgery.  I have reviewed the patient's chart and labs.  Questions were answered to the patient's satisfaction.     [place]

## 2021-05-07 NOTE — H&P (Signed)
History of Present Illness: Marilyn Haas is a 81 y.o. female who is seen today as an office consultation at the request of Dr. Gerarda Haas for evaluation of Breast Problem .   Patient presents at the request of the breast center in [facility] due to a left breast mammographic abnormality noted on recent screening mammogram. She has a history of right breast lobular carcinoma treated with breast conserving surgery in 2018 by Dr. Fanny Haas. Maintained on tamoxifen with good tolerance. She developed an area noted on recent screening/diagnostic mammogram left breast upper outer quadrant. Area distortion core biopsy shown to be atypical lobular hyperplasia. The area measures approximately 2 cm. She has no complaints of breast pain nipple discharge or other abnormality. She is here today with her son Marilyn Haas who helps take care of a lot of her health care issues for her.  Review of Systems: A complete review of systems was obtained from the patient. I have reviewed this information and discussed as appropriate with the patient. See HPI as well for other ROS.    Medical History: Past Medical History:  Diagnosis Date   Anxiety   GERD (gastroesophageal reflux disease)   History of cancer   Hypertension   There is no problem list on file for this patient.  Past Surgical History:  Procedure Laterality Date   hip placement  partical   MASTECTOMY    Allergies  Allergen Reactions   Moxifloxacin Hcl Other (See Comments)  Altered mental status   Current Outpatient Medications on File Prior to Visit  Medication Sig Dispense Refill   ARIPiprazole (ABILIFY) 2 MG tablet Take by mouth   DULoxetine (CYMBALTA) 60 MG DR capsule   HYDROcodone-acetaminophen (NORCO) 10-325 mg tablet   losartan (COZAAR) 25 MG tablet   zolpidem (AMBIEN) 5 MG tablet   No current facility-administered medications on file prior to visit.   Family History  Problem Relation Age of Onset   Hyperlipidemia (Elevated  cholesterol) Mother   Diabetes Mother   Coronary Artery Disease (Blocked arteries around heart) Mother   Deep vein thrombosis (DVT or abnormal blood clot formation) Father   Stroke Sister   Obesity Sister   High blood pressure (Hypertension) Sister   Breast cancer Sister    Social History   Tobacco Use  Smoking Status Former   Types: Cigarettes  Smokeless Tobacco Never    Social History   Socioeconomic History   Marital status: Married  Tobacco Use   Smoking status: Former  Types: Cigarettes   Smokeless tobacco: Never  Substance and Sexual Activity   Alcohol use: Yes   Drug use: Never   Objective:   Vitals:  03/31/21 1454  BP: (!) 162/90  Pulse: 101  Temp: 36.8 C (98.2 F)  SpO2: 92%  Weight: 67.8 kg (149 lb 6.4 oz)  Height: 166.4 cm (5' 5.5")   Body mass index is 24.48 kg/m.  Physical Exam Constitutional:  Appearance: Normal appearance.  HENT:  Head: Normocephalic.  Eyes:  General: No scleral icterus. Cardiovascular:  Rate and Rhythm: Normal rate.  Pulmonary:  Breath sounds: No stridor.  Chest:  Breasts: Left: Normal. No mass or nipple discharge.   Musculoskeletal:  General: Normal range of motion.  Cervical back: Normal range of motion.  Lymphadenopathy:  Upper Body:  Right upper body: No supraclavicular or axillary adenopathy.  Left upper body: No supraclavicular or axillary adenopathy.  Skin: General: Skin is warm.  Neurological:  General: No focal deficit present.  Mental Status: She is alert.  Psychiatric:  Mood and Affect: Mood normal.     Labs, Imaging and Diagnostic Testing: 81 year old female for annual follow-up. History of RIGHT breast cancer and lumpectomy in 2018.   EXAM: DIGITAL DIAGNOSTIC BILATERAL MAMMOGRAM WITH TOMOSYNTHESIS AND CAD; ULTRASOUND LEFT BREAST LIMITED   TECHNIQUE: Bilateral digital diagnostic mammography and breast tomosynthesis was performed. The images were evaluated with  computer-aided detection.; Targeted ultrasound examination of the left breast was performed.   COMPARISON: Previous exam(s).   ACR Breast Density Category c: The breast tissue is heterogeneously dense, which may obscure small masses.   FINDINGS: Full field views of both breasts, a magnification view of the RIGHT lumpectomy site and spot compression views of the LEFT breast are performed.   A new focal asymmetry with possible subtle distortion is identified within the within the UPPER-OUTER LEFT breast.   Lumpectomy changes within the RIGHT breast are again noted.   No other new or suspicious findings are noted within either breast.   Targeted ultrasound is performed, showing no sonographic correlate to the focal asymmetry/possible subtle distortion within the UPPER-OUTER LEFT breast.   No abnormal LEFT axillary lymph nodes are noted.   IMPRESSION: 1. Focal asymmetry/possible distortion within the UPPER-OUTER LEFT breast, without sonographic correlate. Tissue sampling is recommended. 2. No abnormal appearing LEFT axillary lymph nodes. 3. RIGHT lumpectomy changes without suspicious RIGHT breast findings.   RECOMMENDATION: 3D/stereotactic guided LEFT breast biopsy, which will be arranged.   I have discussed the findings and recommendations with the patient. If applicable, a reminder letter will be sent to the patient regarding the next appointment.   BI-RADS CATEGORY 4: Suspicious.     Electronically Signed By: Marilyn Haas M.D.  OnDiagnosis Breast, left, needle core biopsy, left UOQ, X clip - FOCAL LOBULAR NEOPLASIA (ATYPICAL LOBULAR HYPERPLASIA) - SEE COMMENT Microscopic Comment Cytokeratin AE1/3 does not highlight an infiltrative epithelial component. E-cadherin is negative in the focus of lobular neoplasia. Marilyn Sheller MD Pathologist, Electronic Signature (Case signed 03/07/2021) Specimen: 02/20/2021 14:56  Assessment and Plan:   Diagnoses and all orders  for this visit:  Atypical lobular hyperplasia of left breast    Long discussion with patient and son today about options. Given her previous history of right breast lobular carcinoma, I feel prudent to do lumpectomy left. Talked about pros and cons of excising atypical lobular hyperplasia as well as LCIS. Literature does not support routine excision but in her case given her previous cancer history and her concern as well as her son's concern for new breast cancer they wish to proceed which I feel is reasonable in this circumstance. She has been maintained on tamoxifen and aromatase inhibitor last 5 years as well. Discussed nonoperative management given her advanced age but she is willing to proceed with left breast seed localized lumpectomy.  The procedure has been discussed with the patient. Alternatives to surgery have been discussed with the patient. Risks of surgery include bleeding, Infection, Seroma formation, death, and the need for further surgery. The patient understands and wishes to proceed.  No follow-ups on file.  Kennieth Francois, MD

## 2021-05-07 NOTE — Transfer of Care (Signed)
Immediate Anesthesia Transfer of Care Note  Patient: [place]  Procedure(s) Performed: LEFT BREAST LUMPECTOMY WITH RADIOACTIVE SEED LOCALIZATION (Left: Breast)  Patient Location: PACU  Anesthesia Type:General  Level of Consciousness: awake, alert , oriented and patient cooperative  Airway & Oxygen Therapy: Patient Spontanous Breathing and Patient connected to face mask oxygen  Post-op Assessment: Report given to RN, Post -op Vital signs reviewed and stable and Patient moving all extremities X 4  Post vital signs: Reviewed and stable  Last Vitals:  Vitals Value Taken Time  BP 139/73 05/07/21 1315  Temp 36.9 C 05/07/21 1310  Pulse 84 05/07/21 1321  Resp 15 05/07/21 1321  SpO2 100 % 05/07/21 1321  Vitals shown include unvalidated device data.  Last Pain:  Vitals:   05/07/21 1310  TempSrc:   PainSc: 0-No pain      Patients Stated Pain Goal: 1 (41/66/06 3016)  Complications: No notable events documented.

## 2021-05-07 NOTE — Anesthesia Preprocedure Evaluation (Signed)
Anesthesia Evaluation  Patient identified by MRN, date of birth, ID band Patient awake    Reviewed: Allergy & Precautions, H&P , NPO status , Patient's Chart, lab work & pertinent test results, reviewed documented beta blocker date and time   Airway Mallampati: II  TM Distance: >3 FB Neck ROM: full    Dental no notable dental hx. (+) Teeth Intact   Pulmonary asthma , former smoker,    Pulmonary exam normal breath sounds clear to auscultation       Cardiovascular Exercise Tolerance: Good hypertension, Pt. on medications  Rhythm:regular Rate:Normal     Neuro/Psych  Headaches, PSYCHIATRIC DISORDERS Anxiety Depression    GI/Hepatic Neg liver ROS, hiatal hernia, GERD  Medicated,  Endo/Other  negative endocrine ROS  Renal/GU negative Renal ROS  negative genitourinary   Musculoskeletal  (+) Arthritis , Osteoarthritis,    Abdominal   Peds  Hematology  (+) Blood dyscrasia, anemia ,   Anesthesia Other Findings   Reproductive/Obstetrics negative OB ROS                             Anesthesia Physical  Anesthesia Plan  ASA: III  Anesthesia Plan: General   Post-op Pain Management: Minimal or no pain anticipated and Tylenol PO (pre-op)*   Induction: Intravenous  PONV Risk Score and Plan: 3 and Ondansetron, Dexamethasone, Midazolam and Treatment may vary due to age or medical condition  Airway Management Planned: LMA  Additional Equipment:   Intra-op Plan:   Post-operative Plan: Extubation in OR  Informed Consent: I have reviewed the patients History and Physical, chart, labs and discussed the procedure including the risks, benefits and alternatives for the proposed anesthesia with the patient or authorized representative who has indicated his/her understanding and acceptance.     Dental Advisory Given  Plan Discussed with: CRNA  Anesthesia Plan Comments:         Anesthesia  Quick Evaluation

## 2021-05-07 NOTE — Anesthesia Postprocedure Evaluation (Signed)
Anesthesia Post Note  Patient: Marilyn Haas  Procedure(s) Performed: LEFT BREAST LUMPECTOMY WITH RADIOACTIVE SEED LOCALIZATION (Left: Breast)     Patient location during evaluation: PACU Anesthesia Type: General Level of consciousness: awake and alert Pain management: pain level controlled Vital Signs Assessment: post-procedure vital signs reviewed and stable Respiratory status: spontaneous breathing, nonlabored ventilation, respiratory function stable and patient connected to nasal cannula oxygen Cardiovascular status: blood pressure returned to baseline and stable Postop Assessment: no apparent nausea or vomiting Anesthetic complications: no   No notable events documented.  Last Vitals:  Vitals:   05/07/21 1330 05/07/21 1413  BP: (!) 144/73 139/71  Pulse: 84 82  Resp: 12 20  Temp:  36.9 C  SpO2: 100% 100%    Last Pain:  Vitals:   05/07/21 1413  TempSrc: Oral  PainSc: 0-No pain                 Marilyn Haas L Lissandro Dilorenzo

## 2021-05-07 NOTE — Op Note (Signed)
Prep diagnosis: Left breast atypical lobular hyperplasia  Postoperative diagnosis: Same  Procedure: Left breast seed localized lumpectomy  Surgeon: Erroll Luna, MD  Anesthesia: LMA with 0.25% Marcaine plain  EBL: Minimal  Specimen: Left breast tissue with seed and clip verified by Faxitron  Drains: None  Indications for procedure: The patient is a very pleasant 81 year old female with a history of right breast carcinoma in 2018.  She had a lobular subtype and underwent breast conserving surgery.  She developed a new area on her left side on recent mammography of distortion.  Core biopsy showed atypical lobular hyperplasia.  She did maintain on tamoxifen.  We discussed the pros and cons of biopsy in the setting.  We discussed observation with MRI but given her previous cancer history I felt that excisional biopsy was her best choice to exclude malignancy.  Pros and cons of surgery discussed.  Complications of surgery discussed.The procedure has been discussed with the patient. Alternatives to surgery have been discussed with the patient.  Risks of surgery include bleeding,  Infection,  Seroma formation, death,  and the need for further surgery.   The patient understands and wishes to proceed.     Description of procedure: The patient was met in the holding area.  Left side was marked as correct site.  Films were available for review.  She was then taken back to the operating room.  She was placed upon upon the OR table.  After induction of LMA anesthesia, left breast was prepped and draped in sterile fashion timeout performed.  Proper patient, site and procedure verified.  She received preoperative antibiotics.  Neoprobe was used to identify seed left breast upper outer quadrant.  Curvilinear incision was made over the signal.  Dissection was carried down all tissue and the seed and clip were excised with a grossly negative margin.  The Faxitron image revealed seed and clip to be present in the  specimen.  Hemostasis achieved with cautery and irrigation used.  Local anesthetic infiltrated throughout the cavity.  The cavity is closed with 3-0 Vicryl.  Skin closed with 4-0 Monocryl.  Dermabond applied.  All counts were found to be correct.  Breast binder placed.  The patient was awoke extubated taken to recovery in satisfactory condition.

## 2021-05-07 NOTE — Anesthesia Procedure Notes (Signed)
Procedure Name: LMA Insertion Date/Time: 05/07/2021 12:19 PM Performed by: Jonna Munro, CRNA Pre-anesthesia Checklist: Patient identified, Emergency Drugs available, Suction available, Patient being monitored and Timeout performed Patient Re-evaluated:Patient Re-evaluated prior to induction Oxygen Delivery Method: Circle system utilized Preoxygenation: Pre-oxygenation with 100% oxygen Induction Type: IV induction LMA: LMA inserted LMA Size: 3.0 Number of attempts: 1 Placement Confirmation: positive ETCO2 and breath sounds checked- equal and bilateral Tube secured with: Tape Dental Injury: Teeth and Oropharynx as per pre-operative assessment

## 2021-05-08 ENCOUNTER — Encounter (HOSPITAL_BASED_OUTPATIENT_CLINIC_OR_DEPARTMENT_OTHER): Payer: Self-pay | Admitting: Surgery

## 2021-05-12 ENCOUNTER — Encounter: Payer: Self-pay | Admitting: Surgery

## 2021-05-12 LAB — SURGICAL PATHOLOGY

## 2021-05-22 DIAGNOSIS — L209 Atopic dermatitis, unspecified: Secondary | ICD-10-CM | POA: Diagnosis not present

## 2021-05-22 DIAGNOSIS — G894 Chronic pain syndrome: Secondary | ICD-10-CM | POA: Diagnosis not present

## 2021-05-29 ENCOUNTER — Encounter (HOSPITAL_COMMUNITY): Payer: Self-pay

## 2021-06-19 DIAGNOSIS — M150 Primary generalized (osteo)arthritis: Secondary | ICD-10-CM | POA: Diagnosis not present

## 2021-06-19 DIAGNOSIS — Z0001 Encounter for general adult medical examination with abnormal findings: Secondary | ICD-10-CM | POA: Diagnosis not present

## 2021-06-19 DIAGNOSIS — Z79899 Other long term (current) drug therapy: Secondary | ICD-10-CM | POA: Diagnosis not present

## 2021-06-19 DIAGNOSIS — Z1331 Encounter for screening for depression: Secondary | ICD-10-CM | POA: Diagnosis not present

## 2021-06-19 DIAGNOSIS — Z6825 Body mass index (BMI) 25.0-25.9, adult: Secondary | ICD-10-CM | POA: Diagnosis not present

## 2021-06-19 DIAGNOSIS — Z9229 Personal history of other drug therapy: Secondary | ICD-10-CM | POA: Diagnosis not present

## 2021-06-19 DIAGNOSIS — I10 Essential (primary) hypertension: Secondary | ICD-10-CM | POA: Diagnosis not present

## 2021-06-19 DIAGNOSIS — G894 Chronic pain syndrome: Secondary | ICD-10-CM | POA: Diagnosis not present

## 2021-07-04 ENCOUNTER — Encounter (HOSPITAL_COMMUNITY): Payer: Self-pay

## 2021-07-04 ENCOUNTER — Observation Stay (HOSPITAL_COMMUNITY)
Admission: EM | Admit: 2021-07-04 | Discharge: 2021-07-05 | Disposition: A | Discharge status: Home Health | Payer: Medicare Other | Attending: Internal Medicine | Admitting: Internal Medicine

## 2021-07-04 ENCOUNTER — Emergency Department (HOSPITAL_COMMUNITY): Payer: Medicare Other

## 2021-07-04 ENCOUNTER — Other Ambulatory Visit: Payer: Self-pay

## 2021-07-04 DIAGNOSIS — Y92009 Unspecified place in unspecified non-institutional (private) residence as the place of occurrence of the external cause: Secondary | ICD-10-CM | POA: Diagnosis not present

## 2021-07-04 DIAGNOSIS — F32A Depression, unspecified: Secondary | ICD-10-CM

## 2021-07-04 DIAGNOSIS — R5383 Other fatigue: Secondary | ICD-10-CM

## 2021-07-04 DIAGNOSIS — I10 Essential (primary) hypertension: Secondary | ICD-10-CM

## 2021-07-04 DIAGNOSIS — K219 Gastro-esophageal reflux disease without esophagitis: Secondary | ICD-10-CM

## 2021-07-04 DIAGNOSIS — E871 Hypo-osmolality and hyponatremia: Secondary | ICD-10-CM | POA: Diagnosis not present

## 2021-07-04 DIAGNOSIS — Y9301 Activity, walking, marching and hiking: Secondary | ICD-10-CM | POA: Diagnosis not present

## 2021-07-04 DIAGNOSIS — F419 Anxiety disorder, unspecified: Secondary | ICD-10-CM | POA: Diagnosis present

## 2021-07-04 DIAGNOSIS — R410 Disorientation, unspecified: Secondary | ICD-10-CM

## 2021-07-04 DIAGNOSIS — Z87891 Personal history of nicotine dependence: Secondary | ICD-10-CM | POA: Diagnosis not present

## 2021-07-04 DIAGNOSIS — I110 Hypertensive heart disease with heart failure: Secondary | ICD-10-CM | POA: Diagnosis not present

## 2021-07-04 DIAGNOSIS — Z853 Personal history of malignant neoplasm of breast: Secondary | ICD-10-CM | POA: Insufficient documentation

## 2021-07-04 DIAGNOSIS — Z79899 Other long term (current) drug therapy: Secondary | ICD-10-CM | POA: Insufficient documentation

## 2021-07-04 DIAGNOSIS — R4182 Altered mental status, unspecified: Secondary | ICD-10-CM | POA: Diagnosis not present

## 2021-07-04 DIAGNOSIS — I5030 Unspecified diastolic (congestive) heart failure: Secondary | ICD-10-CM | POA: Diagnosis not present

## 2021-07-04 DIAGNOSIS — R531 Weakness: Secondary | ICD-10-CM | POA: Diagnosis not present

## 2021-07-04 DIAGNOSIS — R296 Repeated falls: Secondary | ICD-10-CM | POA: Insufficient documentation

## 2021-07-04 DIAGNOSIS — W19XXXA Unspecified fall, initial encounter: Secondary | ICD-10-CM | POA: Diagnosis not present

## 2021-07-04 LAB — CBC WITH DIFFERENTIAL/PLATELET
Abs Immature Granulocytes: 0.06 10*3/uL (ref 0.00–0.07)
Basophils Absolute: 0.1 10*3/uL (ref 0.0–0.1)
Basophils Relative: 1 %
Eosinophils Absolute: 0.1 10*3/uL (ref 0.0–0.5)
Eosinophils Relative: 1 %
HCT: 32.8 % — ABNORMAL LOW (ref 36.0–46.0)
Hemoglobin: 11.4 g/dL — ABNORMAL LOW (ref 12.0–15.0)
Immature Granulocytes: 1 %
Lymphocytes Relative: 8 %
Lymphs Abs: 0.8 10*3/uL (ref 0.7–4.0)
MCH: 32.6 pg (ref 26.0–34.0)
MCHC: 34.8 g/dL (ref 30.0–36.0)
MCV: 93.7 fL (ref 80.0–100.0)
Monocytes Absolute: 0.8 10*3/uL (ref 0.1–1.0)
Monocytes Relative: 8 %
Neutro Abs: 8.2 10*3/uL — ABNORMAL HIGH (ref 1.7–7.7)
Neutrophils Relative %: 81 %
Platelets: 295 10*3/uL (ref 150–400)
RBC: 3.5 MIL/uL — ABNORMAL LOW (ref 3.87–5.11)
RDW: 11.9 % (ref 11.5–15.5)
WBC: 10 10*3/uL (ref 4.0–10.5)
nRBC: 0 % (ref 0.0–0.2)

## 2021-07-04 LAB — URINALYSIS, ROUTINE W REFLEX MICROSCOPIC
Bilirubin Urine: NEGATIVE
Glucose, UA: NEGATIVE mg/dL
Ketones, ur: NEGATIVE mg/dL
Leukocytes,Ua: NEGATIVE
Nitrite: NEGATIVE
Protein, ur: NEGATIVE mg/dL
Specific Gravity, Urine: 1.009 (ref 1.005–1.030)
pH: 6 (ref 5.0–8.0)

## 2021-07-04 LAB — TROPONIN I (HIGH SENSITIVITY)
Troponin I (High Sensitivity): 4 ng/L (ref <18)
Troponin I (High Sensitivity): 5 ng/L (ref <18)

## 2021-07-04 LAB — COMPREHENSIVE METABOLIC PANEL
ALT: 13 U/L (ref 0–44)
AST: 21 U/L (ref 15–41)
Albumin: 3.7 g/dL (ref 3.5–5.0)
Alkaline Phosphatase: 60 U/L (ref 38–126)
Anion gap: 8 (ref 5–15)
BUN: 10 mg/dL (ref 8–23)
CO2: 23 mmol/L (ref 22–32)
Calcium: 8.7 mg/dL — ABNORMAL LOW (ref 8.9–10.3)
Chloride: 92 mmol/L — ABNORMAL LOW (ref 98–111)
Creatinine, Ser: 0.86 mg/dL (ref 0.44–1.00)
GFR, Estimated: >60 mL/min (ref >60)
Glucose, Bld: 117 mg/dL — ABNORMAL HIGH (ref 70–99)
Potassium: 4.7 mmol/L (ref 3.5–5.1)
Sodium: 123 mmol/L — ABNORMAL LOW (ref 135–145)
Total Bilirubin: 0.7 mg/dL (ref 0.3–1.2)
Total Protein: 7.4 g/dL (ref 6.5–8.1)

## 2021-07-04 LAB — CK: Total CK: 145 U/L (ref 38–234)

## 2021-07-04 MED ORDER — SODIUM CHLORIDE 0.9 % IV BOLUS
500.0000 mL | Freq: Once | INTRAVENOUS | Status: AC
  Administered 2021-07-04: 500 mL via INTRAVENOUS

## 2021-07-04 NOTE — ED Triage Notes (Signed)
Pt was walking with walker at home and became weak and fell. No c/o pain from the fall. Pt was unable to get up and layed on the floor all day. Son gf found pt this afternoon and called EMS. ?

## 2021-07-04 NOTE — ED Notes (Signed)
Pt is a daily drinker of about 15 beers a day.Her last drink was probably yesterday according to son girlfriend. Pt does not really ambulate at home and is incontinent. Small space ambulation with a walker. Family comes in to check on her throughout the day. Dr aware of lumpectomy to bilateral breasts. Left breast is this year and right side is 5 years, so IV on right side per Dr. . Bp on lower leg.  ?

## 2021-07-04 NOTE — ED Provider Notes (Signed)
?[facility] ?Provider Note ? ? ?CSN: [id number] ?Arrival date & time: 07/04/21  1738 ? ?  ? ?History ? ?Chief Complaint  ?Patient presents with  ? Fall  ? Weakness  ? ? ?Marilyn Haas is a 81 y.o. female.  Level 5 caveat secondary to altered mental status.  History is primarily given by patient's son's girlfriend who helps care for her.  Patient lives alone.  She had 2 falls today.  Patient states she was walking to the door with her walker when she became dizzy and weak and fell.  She denies any injury from the falls.  After the second fall she was on the floor for 6 hours before family found her.  Family member states she has been more confused over the last few weeks.  Family member is not sure that patient has been reliably taking her medications.  She has had intermittent leg edema and dry skin over her feet and palms. ? ?The history is provided by the patient and a relative.  ?Fall ?This is a recurrent problem. The current episode started 6 to 12 hours ago. The problem has not changed since onset.Pertinent negatives include no chest pain, no abdominal pain, no headaches and no shortness of breath. Nothing aggravates the symptoms. Nothing relieves the symptoms. She has tried rest for the symptoms. The treatment provided no relief.  ?Weakness ?Associated symptoms: no abdominal pain, no chest pain, no dysuria, no fever, no headaches and no shortness of breath   ? ?  ? ?Home Medications ?Prior to Admission medications   ?Medication Sig Start Date End Date Taking? Authorizing Provider  ?ALPRAZolam (XANAX) 0.25 MG tablet Take 0.25 mg by mouth at bedtime.    [provider]  ?ARIPiprazole (ABILIFY) 2 MG tablet Take 2 mg by mouth daily. 06/28/19   [provider]  ?DULoxetine (CYMBALTA) 60 MG capsule Take 120 mg by mouth daily. 07/29/19   [provider]  ?losartan (COZAAR) 25 MG tablet Take 1 tablet (25 mg total) by mouth 2 (two) times daily. 11/12/20   Satira Sark, MD  ?omeprazole (PRILOSEC) 20 MG capsule Take 1 capsule (20 mg total) by mouth 2 (two) times daily before a meal. 03/28/20   Rehman, Mechele Dawley, MD  ?oxyCODONE (OXY IR/ROXICODONE) 5 MG immediate release tablet Take 1 tablet (5 mg total) by mouth every 6 (six) hours as needed for severe pain. 05/07/21   Cornett, Marcello Moores, MD  ?psyllium (METAMUCIL SMOOTH TEXTURE) 58.6 % powder Take 1 packet by mouth at bedtime. 03/28/20   Rogene Houston, MD  ?tamoxifen (NOLVADEX) 20 MG tablet Take 1 tablet (20 mg total) by mouth daily. 04/21/21   Benay Pike, MD  ?zolpidem (AMBIEN) 5 MG tablet Take 5 mg by mouth at bedtime as needed.    [provider]  ?   ? ?Allergies    ?Avelox [moxifloxacin hcl in nacl]   ? ?Review of Systems   ?Review of Systems  ?Constitutional:  Negative for fever.  ?HENT:  Negative for sore throat.   ?Eyes:  Negative for visual disturbance.  ?Respiratory:  Negative for shortness of breath.   ?Cardiovascular:  Positive for leg swelling. Negative for chest pain.  ?Gastrointestinal:  Negative for abdominal pain.  ?Genitourinary:  Negative for dysuria.  ?Musculoskeletal:  Negative for neck pain.  ?Skin:  Positive for rash.  ?Neurological:  Positive for weakness. Negative for headaches.  ?Psychiatric/Behavioral:  Positive for confusion.   ? ?Physical Exam ?Updated Vital Signs ?  BP (!) 181/86   Pulse 98   Temp 98.2 ?F (36.8 ?C) (Rectal)   Resp 13   Ht '5\' 5"'$  (1.651 m)   Wt 65.8 kg   SpO2 100%   BMI 24.13 kg/m?  ?Physical Exam ?Vitals and nursing note reviewed.  ?Constitutional:   ?   General: She is not in acute distress. ?   Appearance: Normal appearance. She is well-developed.  ?HENT:  ?   Head: Normocephalic and atraumatic.  ?Eyes:  ?   Conjunctiva/sclera: Conjunctivae normal.  ?Cardiovascular:  ?   Rate and Rhythm: Normal rate and regular rhythm.  ?   Heart sounds: No murmur heard. ?Pulmonary:  ?   Effort: Pulmonary effort is normal. No respiratory distress.  ?   Breath sounds: Normal breath  sounds.  ?Abdominal:  ?   Palpations: Abdomen is soft.  ?   Tenderness: There is no abdominal tenderness. There is no guarding or rebound.  ?Musculoskeletal:     ?   General: No swelling. Normal range of motion.  ?   Cervical back: Neck supple.  ?   Right lower leg: Edema present.  ?   Left lower leg: Edema present.  ?Skin: ?   General: Skin is warm and dry.  ?   Capillary Refill: Capillary refill takes less than 2 seconds.  ?Neurological:  ?   General: No focal deficit present.  ?   Mental Status: She is alert. She is disoriented.  ?   Sensory: No sensory deficit.  ?   Motor: No weakness.  ? ? ?ED Results / Procedures / Treatments   ?Labs ?(all labs ordered are listed, but only abnormal results are displayed) ?Labs Reviewed  ?COMPREHENSIVE METABOLIC PANEL - Abnormal; Notable for the following components:  ?    Result Value  ? Sodium 123 (*)   ? Chloride 92 (*)   ? Glucose, Bld 117 (*)   ? Calcium 8.7 (*)   ? All other components within normal limits  ?CBC WITH DIFFERENTIAL/PLATELET - Abnormal; Notable for the following components:  ? RBC 3.50 (*)   ? Hemoglobin 11.4 (*)   ? HCT 32.8 (*)   ? Neutro Abs 8.2 (*)   ? All other components within normal limits  ?URINALYSIS, ROUTINE W REFLEX MICROSCOPIC - Abnormal; Notable for the following components:  ? Hgb urine dipstick MODERATE (*)   ? Bacteria, UA RARE (*)   ? All other components within normal limits  ?COMPREHENSIVE METABOLIC PANEL - Abnormal; Notable for the following components:  ? Sodium 126 (*)   ? Chloride 96 (*)   ? Calcium 8.3 (*)   ? Total Protein 6.2 (*)   ? Albumin 3.2 (*)   ? All other components within normal limits  ?CBC - Abnormal; Notable for the following components:  ? RBC 3.00 (*)   ? Hemoglobin 9.9 (*)   ? HCT 28.9 (*)   ? All other components within normal limits  ?CK  ?APTT  ?MAGNESIUM  ?PHOSPHORUS  ?SODIUM, URINE, RANDOM  ?BASIC METABOLIC PANEL  ?OSMOLALITY, URINE  ?OSMOLALITY  ?TROPONIN I (HIGH SENSITIVITY)  ?TROPONIN I (HIGH SENSITIVITY)   ? ? ?EKG ?None ? ?Radiology ?CT Head Wo Contrast ? ?Result Date: 07/04/2021 ?CLINICAL DATA:  Altered mental status EXAM: CT HEAD WITHOUT CONTRAST TECHNIQUE: Contiguous axial images were obtained from the base of the skull through the vertex without intravenous contrast. RADIATION DOSE REDUCTION: This exam was performed according to the departmental dose-optimization program which includes automated exposure  control, adjustment of the mA and/or kV according to patient size and/or use of iterative reconstruction technique. COMPARISON:  06/24/2016 FINDINGS: Brain: There is no mass, hemorrhage or extra-axial collection. The size and configuration of the ventricles and extra-axial CSF spaces are normal. There is hypoattenuation of the white matter, most commonly indicating chronic small vessel disease. Vascular: No abnormal hyperdensity of the major intracranial arteries or dural venous sinuses. No intracranial atherosclerosis. Skull: The visualized skull base, calvarium and extracranial soft tissues are normal. Sinuses/Orbits: No fluid levels or advanced mucosal thickening of the visualized paranasal sinuses. No mastoid or middle ear effusion. The orbits are normal. IMPRESSION: Chronic small vessel disease without acute intracranial abnormality. Electronically Signed   By: Ulyses Jarred M.D.   On: 07/04/2021 19:54  ? ?DG Chest Port 1 View ? ?Result Date: 07/04/2021 ?CLINICAL DATA:  Weakness, fall EXAM: PORTABLE CHEST 1 VIEW COMPARISON:  Previous studies including the examination of 10/11/2015 FINDINGS: Transverse diameter of heart is within normal limits. There is soft tissue fullness in the retrocardiac region, possibly suggesting fixed hiatal hernia with interval increase in size. There are no signs of alveolar pulmonary edema or focal pulmonary consolidation. There is no significant pleural effusion or pneumothorax. IMPRESSION: There are no signs of pulmonary edema or focal pulmonary consolidation. There is soft  tissue fullness in the retrocardiac region suggesting fixed hiatal hernia with interval increase in size. Electronically Signed   By: Elmer Picker M.D.   On: 07/04/2021 19:37   ? ?Procedures ?Procedures  ? ? ?Medi

## 2021-07-04 NOTE — ED Notes (Signed)
Pt is Hard of hearing.

## 2021-07-04 NOTE — H&P (Signed)
?History and Physical  ? ? ?Patient: Marilyn Haas XAJ:[id number] DOB: 06-06-1940 ?DOA: 07/04/2021 ?DOS: the patient was seen and examined on 07/05/2021 ?PCP: Redmond School, MD  ?Patient coming from: Home ? ?Chief Complaint:  ?Chief Complaint  ?Patient presents with  ? Fall  ? Weakness  ? ?HPI: Marilyn Haas is a 81 y.o. female with medical history significant of hypertension, GERD, anxiety and depression who presents to the emergency department due to fall sustained at home.  Patient lives alone, but family always check on her.  Patient states that she fell twice today at home, but she was unable to provide details regarding how she fell, history was obtained from ED physician and ED medical record.  Per report, she sustained first fall because she felt dizzy and weak while walking to the door with her walker, she was able to get up after the fall.  Patient later on sustained a second fall, but was unable to get up from the floor for about 6 hours prior to being seen by family.  She was reported to have been more confused than last few weeks.  She denies any injuries from the falls. ? ?ED Course:  ?In the emergency department, BP was elevated at 181/86, but other vital signs were within normal range.  Work-up in the ED showed normocytic anemia, BMP was normal except for hyponatremia and mild hyperglycemia, troponin x2 was negative, total CK1 45, urinalysis was unimpressive for UTI ?CT head without contrast showed chronic small vessel disease without acute intracranial abnormality ?Chest x-ray showed no signs of pulmonary edema or focal pulmonary consolidation.  ?IV hydration was provided.  Hospitalist was asked to admit patient for further evaluation and management. ? ? ?Review of Systems: ?Review of systems as noted in the HPI. All other systems reviewed and are negative. ? ? ?Past Medical History:  ?Diagnosis Date  ? Anemia   ? Anxiety   ? Arthritis   ? Breast cancer ([place]) 05/19/2016  ? Right breast  ?  Chronic back pain   ? Scoliosis, stenosis  ? Depression   ? Diverticulosis   ? GERD (gastroesophageal reflux disease)   ? Hard of hearing   ? History of blood transfusion   ? History of colon polyps   ? History of hiatal hernia   ? History of pneumonia   ? Hypertension   ? Insomnia   ? Osteopenia   ? Osteoporosis   ? Personal history of radiation therapy   ? Thickened endometrium 09/29/2017  ? ?Past Surgical History:  ?Procedure Laterality Date  ? BREAST LUMPECTOMY WITH RADIOACTIVE SEED AND SENTINEL LYMPH NODE BIOPSY Right 07/31/2016  ? Procedure: BREAST LUMPECTOMY WITH RADIOACTIVE SEED AND SENTINEL LYMPH NODE BIOPSY, INJECT BLUE DYE RIGHT BREAST;  Surgeon: Fanny Skates, MD;  Location: [facility];  Service: General;  Laterality: Right;  ? BREAST LUMPECTOMY WITH RADIOACTIVE SEED LOCALIZATION Left 05/07/2021  ? Procedure: LEFT BREAST LUMPECTOMY WITH RADIOACTIVE SEED LOCALIZATION;  Surgeon: Erroll Luna, MD;  Location: [facility];  Service: General;  Laterality: Left;  ? COLONOSCOPY    ? 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 07/04/2014  ? Procedure: LEFT CONVERSION TO TOTAL HIP ARTHROPLASTY;  Surgeon: Gaynelle Arabian, MD;  Location: [facility];  Service: Orthopedics;  Laterality: Left;  ? ESOPHAGEAL DILATION N/A 09/24/2017  ? Procedure: ESOPHAGEAL DILATION;  Surgeon: Rogene Houston, MD;  [facility];  Service: Endoscopy;  Laterality: N/A;  ? ESOPHAGEAL DILATION N/A 01/19/2020  ? Procedure: ESOPHAGEAL DILATION;  Surgeon: Montez Morita, Quillian Quince, MD;  [facility];  Service: Gastroenterology;  Laterality: N/A;  ? ESOPHAGEAL DILATION  02/28/2020  ? Procedure: ESOPHAGEAL DILATION;  Surgeon: Rogene Houston, MD;  [facility];  Service: Endoscopy;;  ? ESOPHAGOGASTRODUODENOSCOPY N/A 01/26/2014  ? Procedure: ESOPHAGOGASTRODUODENOSCOPY (EGD);  Surgeon: Rogene Houston, MD;  [facility];  Service: Endoscopy;  Laterality: N/A;  1030  ? ESOPHAGOGASTRODUODENOSCOPY (EGD) WITH PROPOFOL  N/A 09/24/2017  ? Procedure: ESOPHAGOGASTRODUODENOSCOPY (EGD) WITH PROPOFOL;  Surgeon: Rogene Houston, MD;  [facility];  Service: Endoscopy;  Laterality: N/A;  11:15  ? ESOPHAGOGASTRODUODENOSCOPY (EGD) WITH PROPOFOL N/A 01/19/2020  ? Procedure: ESOPHAGOGASTRODUODENOSCOPY (EGD) WITH PROPOFOL;  Surgeon: Harvel Quale, MD;  [facility];  Service: Gastroenterology;  Laterality: N/A;  1200  ? ESOPHAGOGASTRODUODENOSCOPY (EGD) WITH PROPOFOL N/A 02/28/2020  ? Procedure: ESOPHAGOGASTRODUODENOSCOPY (EGD) WITH PROPOFOL;  Surgeon: Rogene Houston, MD;  [facility];  Service: Endoscopy;  Laterality: N/A;  2:45  ? HIP FRACTURE SURGERY    ? 05/2009 left -[facility]  ? HYSTEROSCOPY WITH D & C  05/24/2018  ? Procedure: DILATATION AND CURETTAGE /HYSTEROSCOPY (PROCEDURE # 2)/PAP SMEAR (PROCEDURE #1);  Surgeon: Jonnie Kind, MD;  Location[facility];  Service: Gynecology;;  ? Venia Minks DILATION N/A 01/26/2014  ? Procedure: MALONEY DILATION;  Surgeon: Rogene Houston, MD;  [facility];  Service: Endoscopy;  Laterality: N/A;  ? ? ?Social History:  reports that she quit smoking about 60 years ago. Her smoking use included cigarettes. She has a 1.00 pack-year smoking history. She has never used smokeless tobacco. She reports current alcohol use of about 15.0 standard drinks per week. She reports that she does not use drugs. ? ? ?Allergies  ?Allergen Reactions  ? Avelox [Moxifloxacin Hcl In Nacl] Other (See Comments)  ?  Altered mental status  ? ? ?Family History  ?Problem Relation Age of Onset  ? Heart failure Mother   ? Diabetes Mother   ? Hypertension Mother   ? Depression Mother   ? Other Father   ?     stomach ulcers; abd aneursym  ? Hypertension Sister   ? Heart failure Sister   ? Diabetes Sister   ? Depression Sister   ? Diabetes Sister   ?     prediabetes  ? Cancer Maternal Aunt   ?     breast  ?  ? ?Prior to Admission medications   ?Medication Sig Start Date End Date Taking?  Authorizing Provider  ?ALPRAZolam (XANAX) 0.25 MG tablet Take 0.25 mg by mouth at bedtime.    [provider]  ?ARIPiprazole (ABILIFY) 2 MG tablet Take 2 mg by mouth daily. 06/28/19   [provider]  ?DULoxetine (CYMBALTA) 60 MG capsule Take 120 mg by mouth daily. 07/29/19   [provider]  ?losartan (COZAAR) 25 MG tablet Take 1 tablet (25 mg total) by mouth 2 (two) times daily. 11/12/20   Satira Sark, MD  ?omeprazole (PRILOSEC) 20 MG capsule Take 1 capsule (20 mg total) by mouth 2 (two) times daily before a meal. 03/28/20   Rehman, Mechele Dawley, MD  ?oxyCODONE (OXY IR/ROXICODONE) 5 MG immediate release tablet Take 1 tablet (5 mg total) by mouth every 6 (six) hours as needed for severe pain. 05/07/21   Cornett, Marcello Moores, MD  ?psyllium (METAMUCIL SMOOTH TEXTURE) 58.6 % powder Take 1 packet by mouth at bedtime. 03/28/20   Rogene Houston, MD  ?tamoxifen (  NOLVADEX) 20 MG tablet Take 1 tablet (20 mg total) by mouth daily. 04/21/21   Benay Pike, MD  ?zolpidem (AMBIEN) 5 MG tablet Take 5 mg by mouth at bedtime as needed.    [provider]  ? ? ?Physical Exam: ?BP 126/76   Pulse 87   Temp 98.2 ?F (36.8 ?C) (Rectal)   Resp 12   Ht '5\' 5"'$  (1.651 m)   Wt 65.8 kg   SpO2 99%   BMI 24.13 kg/m?  ? ?General: 81 y.o. year-old female well developed well nourished in no acute distress.  Alert and oriented x3. ?HEENT: NCAT, EOMI, dry mucosal membrane ?Neck: Supple, trachea medial ?Cardiovascular: Regular rate and rhythm with no rubs or gallops.  No thyromegaly or JVD noted.  2/4 pulses in all 4 extremities. ?Respiratory: Clear to auscultation with no wheezes or rales. Good inspiratory effort. ?Abdomen: Soft, nontender nondistended with normal bowel sounds x4 quadrants. ?Muskuloskeletal: Bilateral pedal edema.  No cyanosis or clubbing noted bilaterally ?Neuro: CN II-XII intact, strength 5/5 x 4, sensation, reflexes intact ?Skin: Dry skin.  No ulcerative lesions noted ?Psychiatry: Mood is  appropriate for condition and setting ?   ?   ?   ?Labs on Admission:  ?Basic Metabolic Panel: ?Recent Labs  ?Lab 07/04/21 ?1848  ?NA 123*  ?K 4.7  ?CL 92*  ?CO2 23  ?GLUCOSE 117*  ?BUN 10  ?CREATININE 0.86  ?CALCIUM

## 2021-07-05 DIAGNOSIS — R0902 Hypoxemia: Secondary | ICD-10-CM | POA: Diagnosis not present

## 2021-07-05 DIAGNOSIS — Y92009 Unspecified place in unspecified non-institutional (private) residence as the place of occurrence of the external cause: Secondary | ICD-10-CM | POA: Diagnosis not present

## 2021-07-05 DIAGNOSIS — K219 Gastro-esophageal reflux disease without esophagitis: Secondary | ICD-10-CM

## 2021-07-05 DIAGNOSIS — I10 Essential (primary) hypertension: Secondary | ICD-10-CM | POA: Diagnosis not present

## 2021-07-05 DIAGNOSIS — W19XXXA Unspecified fall, initial encounter: Secondary | ICD-10-CM | POA: Diagnosis not present

## 2021-07-05 DIAGNOSIS — Z743 Need for continuous supervision: Secondary | ICD-10-CM | POA: Diagnosis not present

## 2021-07-05 DIAGNOSIS — E871 Hypo-osmolality and hyponatremia: Secondary | ICD-10-CM | POA: Diagnosis not present

## 2021-07-05 DIAGNOSIS — F32A Depression, unspecified: Secondary | ICD-10-CM

## 2021-07-05 LAB — CBC
HCT: 28.9 % — ABNORMAL LOW (ref 36.0–46.0)
Hemoglobin: 9.9 g/dL — ABNORMAL LOW (ref 12.0–15.0)
MCH: 33 pg (ref 26.0–34.0)
MCHC: 34.3 g/dL (ref 30.0–36.0)
MCV: 96.3 fL (ref 80.0–100.0)
Platelets: 276 10*3/uL (ref 150–400)
RBC: 3 MIL/uL — ABNORMAL LOW (ref 3.87–5.11)
RDW: 12 % (ref 11.5–15.5)
WBC: 8 10*3/uL (ref 4.0–10.5)
nRBC: 0 % (ref 0.0–0.2)

## 2021-07-05 LAB — COMPREHENSIVE METABOLIC PANEL
ALT: 10 U/L (ref 0–44)
AST: 17 U/L (ref 15–41)
Albumin: 3.2 g/dL — ABNORMAL LOW (ref 3.5–5.0)
Alkaline Phosphatase: 48 U/L (ref 38–126)
Anion gap: 7 (ref 5–15)
BUN: 8 mg/dL (ref 8–23)
CO2: 23 mmol/L (ref 22–32)
Calcium: 8.3 mg/dL — ABNORMAL LOW (ref 8.9–10.3)
Chloride: 96 mmol/L — ABNORMAL LOW (ref 98–111)
Creatinine, Ser: 0.86 mg/dL (ref 0.44–1.00)
GFR, Estimated: >60 mL/min (ref >60)
Glucose, Bld: 91 mg/dL (ref 70–99)
Potassium: 4.4 mmol/L (ref 3.5–5.1)
Sodium: 126 mmol/L — ABNORMAL LOW (ref 135–145)
Total Bilirubin: 0.5 mg/dL (ref 0.3–1.2)
Total Protein: 6.2 g/dL — ABNORMAL LOW (ref 6.5–8.1)

## 2021-07-05 LAB — BASIC METABOLIC PANEL
Anion gap: 6 (ref 5–15)
BUN: 9 mg/dL (ref 8–23)
CO2: 23 mmol/L (ref 22–32)
Calcium: 8.3 mg/dL — ABNORMAL LOW (ref 8.9–10.3)
Chloride: 96 mmol/L — ABNORMAL LOW (ref 98–111)
Creatinine, Ser: 0.89 mg/dL (ref 0.44–1.00)
GFR, Estimated: >60 mL/min (ref >60)
Glucose, Bld: 115 mg/dL — ABNORMAL HIGH (ref 70–99)
Potassium: 4.2 mmol/L (ref 3.5–5.1)
Sodium: 125 mmol/L — ABNORMAL LOW (ref 135–145)

## 2021-07-05 LAB — OSMOLALITY: Osmolality: 260 mOsm/kg — ABNORMAL LOW (ref 275–295)

## 2021-07-05 LAB — MAGNESIUM: Magnesium: 2.1 mg/dL (ref 1.7–2.4)

## 2021-07-05 LAB — PHOSPHORUS: Phosphorus: 3.9 mg/dL (ref 2.5–4.6)

## 2021-07-05 LAB — SODIUM, URINE, RANDOM: Sodium, Ur: 67 mmol/L

## 2021-07-05 LAB — APTT: aPTT: 35 seconds (ref 24–36)

## 2021-07-05 LAB — OSMOLALITY, URINE: Osmolality, Ur: 239 mOsm/kg — ABNORMAL LOW (ref 300–900)

## 2021-07-05 MED ORDER — SODIUM CHLORIDE 0.9 % IV SOLN: INTRAVENOUS | Status: AC

## 2021-07-05 MED ORDER — ENOXAPARIN SODIUM 40 MG/0.4ML IJ SOSY
40.0000 mg | PREFILLED_SYRINGE | INTRAMUSCULAR | Status: DC
  Administered 2021-07-05: 40 mg via SUBCUTANEOUS
  Filled 2021-07-05: qty 0.4

## 2021-07-05 NOTE — Evaluation (Signed)
Physical Therapy Evaluation ?Patient Details ?Name: Marilyn Haas ?MRN: [id number] ?DOB: 05-07-1940 ?Today's Date: 07/05/2021 ? ?History of Present Illness ? Pt transferred to ER yesterday after sustaining 2 falls.  ?Clinical Impression ? Pt states that she is fearful of falling, has not been using ?An assistive device at home.  PT ambulated well using a rolling ?Walker recommend rolling walker at D/C and home health to assess ?Pt home environment.    ?   ? ?Recommendations for follow up therapy are one component of a multi-disciplinary discharge planning process, led by the attending physician.  Recommendations may be updated based on patient status, additional functional criteria and insurance authorization. ? ?Follow Up Recommendations Home health PT ? ?  ?Assistance Recommended at Discharge Intermittent Supervision/Assistance  ?Patient can return home with the following ? Assistance with cooking/housework ? ?  ?Equipment Recommendations Rolling walker (2 wheels)  ?Recommendations for Other Services ?    ?  ?Functional Status Assessment Patient has had a recent decline in their functional status and demonstrates the ability to make significant improvements in function in a reasonable and predictable amount of time.  ? ?  ?Precautions / Restrictions Precautions ?Precautions: None ?Restrictions ?Weight Bearing Restrictions: No  ? ?  ? ?Mobility ? Bed Mobility ?Overal bed mobility: Modified Independent ?  ?  ?  ?  ?  ?  ?General bed mobility comments: Pt needed min assist getting out of ER hospital bed but therapist feels she would have no difficulty with a normal bed. ?  ? ?Transfers ?Overall transfer level: Modified independent ?  ?  ?  ?  ?  ?  ?  ?  ?  ?  ? ?Ambulation/Gait ?Ambulation/Gait assistance: Modified independent (Device/Increase time) ?Gait Distance (Feet): 70 Feet ?Assistive device: Rolling walker (2 wheels) ?Gait Pattern/deviations: Step-through pattern ?  ?  ?  ?  ? ?Stairs ?  ?  ?  ?  ?   ? ?Wheelchair Mobility ?  ? ?Modified Rankin (Stroke Patients Only) ?  ? ?  ? ?Balance   ?  ?  ?  ?  ?  ?  ?  ?  ?  ?  ?  ?  ?  ?  ?  ?  ?  ?  ?   ? ? ? ?Pertinent Vitals/Pain Pain Assessment ?Pain Assessment: No/denies pain  ? ? ?Home Living Family/patient expects to be discharged to:: Private residence ?Living Arrangements: Alone ?Available Help at Discharge: Family ?Type of Home: House ?  ?  ?  ?  ?Home Layout: Able to live on main level with bedroom/bathroom (Pt states that she really just stays in one room at this time.) ?Home Equipment: Standard Walker ?   ?  ?Prior Function Prior Level of Function : Independent/Modified Independent ?  ?  ?  ?  ?  ?  ?  ?  ?  ? ? ?Hand Dominance  ?   ? ?  ?Extremity/Trunk Assessment  ?   ?  ? ?Lower Extremity Assessment ?Lower Extremity Assessment: Overall WFL for tasks assessed ?  ? ?   ?Communication  ? Communication: No difficulties  ?Cognition Arousal/Alertness: Awake/alert ?  ?  ?  ?  ?  ?  ?  ?  ?  ?  ?  ?  ?  ?  ?  ?  ?  ?  ?  ?  ?  ? ?  ?General Comments   ? ?  ?Exercises    ? ?Assessment/Plan  ?  ?  PT Assessment All further PT needs can be met in the next venue of care  ?PT Problem List Decreased activity tolerance;Decreased balance ? ?   ?  ?PT Treatment Interventions     ? ?PT Goals (Current goals can be found in the Care Plan section)  ?Acute Rehab PT Goals ?Patient Stated Goal: to go home ?Time For Goal Achievement: 07/06/21 ?Potential to Achieve Goals: Good ? ?  ?Frequency   ?  ? ? ?Co-evaluation   ?  ?  ?  ?  ? ? ?  ?AM-PAC PT "6 Clicks" Mobility  ?Outcome Measure Help needed turning from your back to your side while in a flat bed without using bedrails?: None ?Help needed moving from lying on your back to sitting on the side of a flat bed without using bedrails?: A Little ?Help needed moving to and from a bed to a chair (including a wheelchair)?: A Little ?Help needed standing up from a chair using your arms (e.g., wheelchair or bedside chair)?: A Little ?Help  needed to walk in hospital room?: A Little ?Help needed climbing 3-5 steps with a railing? : A Little ?6 Click Score: 19 ? ?  ?End of Session Equipment Utilized During Treatment: Gait belt ?Activity Tolerance: Patient tolerated treatment well ?Patient left: in bed;with call bell/phone within reach ?  ?PT Visit Diagnosis: Repeated falls (R29.6) ?  ? ?Time: 8185-9093 ?PT Time Calculation (min) (ACUTE ONLY): 52 min ? ? ?Charges:   PT Evaluation ?$PT Eval Low Complexity: 1 Low ?  ?  ?   ? ? ? ? ?Rayetta Humphrey, PT CLT ?[phone]  ?07/05/2021, 12:53 PM ? ?

## 2021-07-05 NOTE — Discharge Summary (Signed)
Physician Discharge Summary  ?[name] AUQ:[id number] DOB: 04-01-1940 DOA: 07/04/2021 ? ?PCP: Redmond School, MD ? ?Admit date: 07/04/2021 ?Discharge date: 07/05/2021 ? ?Admitted From: Home ?Disposition: Home ? ?Recommendations for Outpatient Follow-up:  ?Follow up with PCP in 1-2 weeks ?Please obtain BMP/CBC in one week ? ?Home Health: Yes ?Equipment/Devices: Rolling walker ? ?Discharge Condition: Stable ?CODE STATUS: Full ?Diet recommendation: As tolerated ? ?Brief/Interim Summary: ?ARYA LUTTRULL is a 81 y.o. female with medical history significant of hypertension, GERD, anxiety and depression who presents to the emergency department due to fall sustained at home. She lives alone at home with family checking in on her. Reports presyncope/dizziness prior to fall while at home. ? ?Unclear etiology however patient and son indicates she has had increasing falls concerning for polypharmacy given multiple medications were held at intake and she has improved drastically over the past 24 hours.  Recommending discharge home, PT recommending home health PT ongoing, she did not qualify for nursing home placement given her stability today but will discharge with physical therapy and rolling walker.  At this time would recommend discontinuation of multiple home medications including oxycodone hydrocodone zolpidem and alprazolam which are all listed on the beers criteria as contraindicated in his age group.  Patient also, per son, consumes beer essentially daily.  We discussed weaning her off of this might improve her ambulatory status as well but warned against abrupt cessation given prolonged use and concern for alcohol withdrawals.  At this time however patient is back to baseline otherwise stable and agreeable for discharge home.  Close follow-up with PCP in the next 1 to 2 weeks as discussed, patient may benefit from long-term care or assisted living however this will be discussion between patient family and  primary care physician. ? ?Discharge Diagnoses:  ?Principal Problem: ?  Fall at home, initial encounter ?Active Problems: ?  Essential hypertension ?  Anxiety ?  Chronic hyponatremia ?  GERD (gastroesophageal reflux disease) ?  Depression ? ? ? ?Discharge Instructions ? ?Discharge Instructions   ? ? Discharge patient   Complete by: As directed ?  ? Discharge disposition: 06-Home-Health Care Svc  ? Discharge patient date: 07/05/2021  ? ?  ? ?Allergies as of 07/05/2021   ? ?   Reactions  ? Avelox [moxifloxacin Hcl In Nacl] Other (See Comments)  ? Altered mental status  ? ?  ? ?  ?Medication List  ?  ? ?STOP taking these medications   ? ?ALPRAZolam 0.25 MG tablet ?Commonly known as: Duanne Moron ?  ?HYDROcodone-acetaminophen 10-325 MG tablet ?Commonly known as: NORCO ?  ?oxyCODONE 5 MG immediate release tablet ?Commonly known as: Oxy IR/ROXICODONE ?  ?zolpidem 5 MG tablet ?Commonly known as: AMBIEN ?  ? ?  ? ?TAKE these medications   ? ?ARIPiprazole 2 MG tablet ?Commonly known as: ABILIFY ?Take 2 mg by mouth daily. ?  ?DULoxetine 60 MG capsule ?Commonly known as: CYMBALTA ?Take 120 mg by mouth daily. ?  ?furosemide 20 MG tablet ?Commonly known as: LASIX ?Take 20 mg by mouth daily as needed. ?  ?losartan 25 MG tablet ?Commonly known as: COZAAR ?Take 1 tablet (25 mg total) by mouth 2 (two) times daily. ?  ?Metamucil Smooth Texture 58.6 % powder ?Generic drug: psyllium ?Take 1 packet by mouth at bedtime. ?  ?omeprazole 20 MG capsule ?Commonly known as: PRILOSEC ?Take 1 capsule (20 mg total) by mouth 2 (two) times daily before a meal. ?What changed: when to take this ?  ?tamoxifen 20  MG tablet ?Commonly known as: NOLVADEX ?Take 1 tablet (20 mg total) by mouth daily. ?  ?Vitamin D3 125 MCG (5000 UT) Caps ?Take 1 capsule by mouth daily. ?  ?zinc gluconate 50 MG tablet ?Take 50 mg by mouth daily. ?  ? ?  ? ? ?Allergies  ?Allergen Reactions  ? Avelox [Moxifloxacin Hcl In Nacl] Other (See Comments)  ?  Altered mental status   ? ? ?Consultations: ?None ? ?Procedures/Studies: ?CT Head Wo Contrast ? ?Result Date: 07/04/2021 ?CLINICAL DATA:  Altered mental status EXAM: CT HEAD WITHOUT CONTRAST TECHNIQUE: Contiguous axial images were obtained from the base of the skull through the vertex without intravenous contrast. RADIATION DOSE REDUCTION: This exam was performed according to the departmental dose-optimization program which includes automated exposure control, adjustment of the mA and/or kV according to patient size and/or use of iterative reconstruction technique. COMPARISON:  06/24/2016 FINDINGS: Brain: There is no mass, hemorrhage or extra-axial collection. The size and configuration of the ventricles and extra-axial CSF spaces are normal. There is hypoattenuation of the white matter, most commonly indicating chronic small vessel disease. Vascular: No abnormal hyperdensity of the major intracranial arteries or dural venous sinuses. No intracranial atherosclerosis. Skull: The visualized skull base, calvarium and extracranial soft tissues are normal. Sinuses/Orbits: No fluid levels or advanced mucosal thickening of the visualized paranasal sinuses. No mastoid or middle ear effusion. The orbits are normal. IMPRESSION: Chronic small vessel disease without acute intracranial abnormality. Electronically Signed   By: Ulyses Jarred M.D.   On: 07/04/2021 19:54  ? ?DG Chest Port 1 View ? ?Result Date: 07/04/2021 ?CLINICAL DATA:  Weakness, fall EXAM: PORTABLE CHEST 1 VIEW COMPARISON:  Previous studies including the examination of 10/11/2015 FINDINGS: Transverse diameter of heart is within normal limits. There is soft tissue fullness in the retrocardiac region, possibly suggesting fixed hiatal hernia with interval increase in size. There are no signs of alveolar pulmonary edema or focal pulmonary consolidation. There is no significant pleural effusion or pneumothorax. IMPRESSION: There are no signs of pulmonary edema or focal pulmonary  consolidation. There is soft tissue fullness in the retrocardiac region suggesting fixed hiatal hernia with interval increase in size. Electronically Signed   By: Elmer Picker M.D.   On: 07/04/2021 19:37   ? ? ?Subjective: No acute issues or events overnight, essentially back to baseline, review of systems other than previously reported weakness and falls remains negative. ? ? ?Discharge Exam: ?Vitals:  ? 07/05/21 1100 07/05/21 1200  ?BP: (!) 150/76 (!) 145/74  ?Pulse: 90 88  ?Resp: 17 12  ?Temp:    ?SpO2: 98% 98%  ? ?Vitals:  ? 07/05/21 1000 07/05/21 1030 07/05/21 1100 07/05/21 1200  ?BP: 129/87 (!) 168/85 (!) 150/76 (!) 145/74  ?Pulse: 89 (!) 45 90 88  ?Resp: '18 19 17 12  '$ ?Temp:      ?TempSrc:      ?SpO2:  95% 98% 98%  ?Weight:      ?Height:      ? ? ?General: Pt is alert, awake, not in acute distress ?Cardiovascular: RRR, S1/S2 +, no rubs, no gallops ?Respiratory: CTA bilaterally, no wheezing, no rhonchi ?Abdominal: Soft, NT, ND, bowel sounds + ?Extremities: no edema, no cyanosis ? ? ? ?The results of significant diagnostics from this hospitalization (including imaging, microbiology, ancillary and laboratory) are listed below for reference.   ? ? ?Microbiology: ?No results found for this or any previous visit (from the past 240 hour(s)).  ? ?Labs: ?BNP (last 3 results) ?No results  for input(s): BNP in the last 8760 hours. ?Basic Metabolic Panel: ?Recent Labs  ?Lab 07/04/21 ?1848 07/05/21 ?0440 07/05/21 ?1256  ?NA 123* 126* 125*  ?K 4.7 4.4 4.2  ?CL 92* 96* 96*  ?CO2 '23 23 23  '$ ?GLUCOSE 117* 91 115*  ?BUN '10 8 9  '$ ?CREATININE 0.86 0.86 0.89  ?CALCIUM 8.7* 8.3* 8.3*  ?MG  --  2.1  --   ?PHOS  --  3.9  --   ? ?Liver Function Tests: ?Recent Labs  ?Lab 07/04/21 ?1848 07/05/21 ?0440  ?AST 21 17  ?ALT 13 10  ?ALKPHOS 60 48  ?BILITOT 0.7 0.5  ?PROT 7.4 6.2*  ?ALBUMIN 3.7 3.2*  ? ?No results for input(s): LIPASE, AMYLASE in the last 168 hours. ?No results for input(s): AMMONIA in the last 168 hours. ?CBC: ?Recent  Labs  ?Lab 07/04/21 ?1848 07/05/21 ?0440  ?WBC 10.0 8.0  ?NEUTROABS 8.2*  --   ?HGB 11.4* 9.9*  ?HCT 32.8* 28.9*  ?MCV 93.7 96.3  ?PLT 295 276  ? ?Cardiac Enzymes: ?Recent Labs  ?Lab 07/04/21 ?1848  ?CKTOTAL 145  ? ?BNP: ?Invalid in

## 2021-07-05 NOTE — Care Management Obs Status (Signed)
MEDICARE OBSERVATION STATUS NOTIFICATION ? ? ?Patient Details  ?Name: Marilyn Haas ?MRN: [id number] ?Date of Birth: 1940/05/08 ? ? ?Medicare Observation Status Notification Given:  Yes ? ? ? ?Carles Collet, RN ?07/05/2021, 3:40 PM ?

## 2021-07-05 NOTE — TOC Initial Note (Signed)
Transition of Care (TOC) - Initial/Assessment Note  ? ? ?Patient Details  ?Name: Marilyn Haas ?MRN: [id number] ?Date of Birth: 1941-01-21 ? ?Transition of Care (TOC) CM/SW Contact:    ?Carles Collet, RN ?Phone Number: ?07/05/2021, 3:13 PM ? ?Clinical Narrative:                Spoke w patient over the phone. Very hard of hearing, she deferred to her son Marilyn Haas who lives in [place]. Marilyn Haas states that he will be leaving [employer] today with plans on coming to his mother's house tonight.  ?[employer] and reviewed code 66. Will have CMA mail form to his address. Also discussed DC plans. ?He is concerned with her lack of hygiene at home. Requested [facility] SW and HHA orders from attending, [facility].  ?Son requested [facility], they have accepted referral and know to call son to set up start of care. ?Ordered rollator, this will be delivered to the house. ? ? ?  ?Barriers to Discharge: No Barriers Identified ? ? ?Patient Goals and CMS Choice ?Patient states their goals for this hospitalization and ongoing recovery are:: to go home ?CMS Medicare.gov Compare Post Acute Care list provided to:: Other (Comment Required) ?Choice offered to / list presented to : Adult Children ? ?Expected Discharge Plan and Services ?  ?  ?  ?  ?  ?Expected Discharge Date: 07/05/21               ?DME Arranged: [place] rolling ?DME Agency: [employer] ?Date DME Agency Contacted: 07/05/21 ?Time DME Agency Contacted: [id number] ?Representative spoke with at DME Agency: Delana Meyer ?HH Arranged: PT, Nurse's Aide, Social Work ?[place] Agency: [place] [facility] ?Date HH Agency Contacted: 07/05/21 ?Time [place]: [id number] ?Representative spoke with at [place]: Corene Cornea ? ?Prior Living Arrangements/Services ?  ?  ?  ?       ?  ?  ?  ?  ? ?Activities of Daily Living ?  ?  ? ?Permission Sought/Granted ?  ?  ?   ?   ?   ?   ? ?Emotional Assessment ?  ?  ?  ?  ?  ?  ? ?Admission diagnosis:  Fall at home, initial encounter [W19.XXXA,  Y92.009] ?Patient Active Problem List  ? Diagnosis Date Noted  ? GERD (gastroesophageal reflux disease) 07/05/2021  ? Depression 07/05/2021  ? Fall at home, initial encounter 07/04/2021  ? MDD (major depressive disorder), recurrent episode, moderate ([place]) 06/13/2018  ? Thickened endometrium 09/29/2017  ? LLQ fullness 09/23/2017  ? History of breast cancer 09/23/2017  ? Screening for colorectal cancer 09/23/2017  ? Routine cervical smear 09/23/2017  ? Encounter for gynecological examination with Papanicolaou smear of cervix 09/23/2017  ? Dysphagia 09/01/2017  ? 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 ([place]) 08/13/2016  ? Malignant neoplasm of upper-outer quadrant of right breast in female, estrogen receptor positive ([place]) 06/22/2016  ? Osteopenia determined by x-ray 06/22/2016  ? Chronic hyponatremia 10/12/2015  ? Syncope 10/11/2015  ? Acute pancreatitis 10/03/2015  ? Other fatigue 12/16/2014  ? PVC's (premature ventricular contractions) 12/16/2014  ? Pain with hip hemiarthroplasty ([place]) 07/04/2014  ? Left hip pain 01/31/2013  ? Anemia 08/09/2012  ? B12 deficiency anemia 08/09/2012  ? Vitamin D deficiency 08/09/2012  ? Anxiety 08/09/2012  ? Palpitations 08/09/2012  ? Hyperlipidemia 05/25/2011  ? Essential hypertension 05/25/2011  ? ?PCP:  Redmond School, MD ?Pharmacy:   ?[address] [place]  [address] ?[place] [address] ?Phone: [phone] Fax: [phone] ? ? ? ? ?Social Determinants of Health (SDOH) Interventions ?  ? ?Readmission Risk Interventions ?   ? View : No data to display.  ?  ?  ?  ? ? ? ?

## 2021-07-05 NOTE — Care Management CC44 (Signed)
Condition Code 44 Documentation Completed ? ?Patient Details  ?Name: Marilyn Haas ?MRN: [id number] ?Date of Birth: 09-Mar-1941 ? ? ?Condition Code 44 given:  Yes ?Patient signature on Condition Code 44 notice:  Yes ?Documentation of 2 MD's agreement:  Yes ?Code 44 added to claim:  Yes ? ? ? ?Carles Collet, RN ?07/05/2021, 3:40 PM ? ?

## 2021-07-05 NOTE — Progress Notes (Signed)
CSW received notification from UR RN stating patient needed to be given a Code 44. CSW spoke with Jackelyn Poling, RN CM who was agreeable to deliver message. ? ?Madilyn Fireman, MSW, LCSW ?Transitions of Care  Clinical Social Worker II ?[phone] ? ?

## 2021-07-06 DIAGNOSIS — M419 Scoliosis, unspecified: Secondary | ICD-10-CM | POA: Diagnosis not present

## 2021-07-06 DIAGNOSIS — F32A Depression, unspecified: Secondary | ICD-10-CM | POA: Diagnosis not present

## 2021-07-06 DIAGNOSIS — M4800 Spinal stenosis, site unspecified: Secondary | ICD-10-CM | POA: Diagnosis not present

## 2021-07-06 DIAGNOSIS — M810 Age-related osteoporosis without current pathological fracture: Secondary | ICD-10-CM | POA: Diagnosis not present

## 2021-07-06 DIAGNOSIS — M545 Low back pain, unspecified: Secondary | ICD-10-CM | POA: Diagnosis not present

## 2021-07-06 DIAGNOSIS — Z9181 History of falling: Secondary | ICD-10-CM | POA: Diagnosis not present

## 2021-07-06 DIAGNOSIS — G4700 Insomnia, unspecified: Secondary | ICD-10-CM | POA: Diagnosis not present

## 2021-07-06 DIAGNOSIS — F419 Anxiety disorder, unspecified: Secondary | ICD-10-CM | POA: Diagnosis not present

## 2021-07-06 DIAGNOSIS — M1990 Unspecified osteoarthritis, unspecified site: Secondary | ICD-10-CM | POA: Diagnosis not present

## 2021-07-06 DIAGNOSIS — C50911 Malignant neoplasm of unspecified site of right female breast: Secondary | ICD-10-CM | POA: Diagnosis not present

## 2021-07-06 DIAGNOSIS — D630 Anemia in neoplastic disease: Secondary | ICD-10-CM | POA: Diagnosis not present

## 2021-07-06 DIAGNOSIS — K5790 Diverticulosis of intestine, part unspecified, without perforation or abscess without bleeding: Secondary | ICD-10-CM | POA: Diagnosis not present

## 2021-07-06 DIAGNOSIS — G8929 Other chronic pain: Secondary | ICD-10-CM | POA: Diagnosis not present

## 2021-07-06 DIAGNOSIS — K219 Gastro-esophageal reflux disease without esophagitis: Secondary | ICD-10-CM | POA: Diagnosis not present

## 2021-07-06 DIAGNOSIS — Z79810 Long term (current) use of selective estrogen receptor modulators (SERMs): Secondary | ICD-10-CM | POA: Diagnosis not present

## 2021-07-06 DIAGNOSIS — I5030 Unspecified diastolic (congestive) heart failure: Secondary | ICD-10-CM | POA: Diagnosis not present

## 2021-07-06 DIAGNOSIS — R410 Disorientation, unspecified: Secondary | ICD-10-CM | POA: Diagnosis not present

## 2021-07-06 DIAGNOSIS — E871 Hypo-osmolality and hyponatremia: Secondary | ICD-10-CM | POA: Diagnosis not present

## 2021-07-06 DIAGNOSIS — I10 Essential (primary) hypertension: Secondary | ICD-10-CM | POA: Diagnosis not present

## 2021-07-06 DIAGNOSIS — W19XXXD Unspecified fall, subsequent encounter: Secondary | ICD-10-CM | POA: Diagnosis not present

## 2021-07-08 DIAGNOSIS — R410 Disorientation, unspecified: Secondary | ICD-10-CM | POA: Diagnosis not present

## 2021-07-08 DIAGNOSIS — F419 Anxiety disorder, unspecified: Secondary | ICD-10-CM | POA: Diagnosis not present

## 2021-07-08 DIAGNOSIS — I5030 Unspecified diastolic (congestive) heart failure: Secondary | ICD-10-CM | POA: Diagnosis not present

## 2021-07-08 DIAGNOSIS — I10 Essential (primary) hypertension: Secondary | ICD-10-CM | POA: Diagnosis not present

## 2021-07-08 DIAGNOSIS — F32A Depression, unspecified: Secondary | ICD-10-CM | POA: Diagnosis not present

## 2021-07-08 DIAGNOSIS — E871 Hypo-osmolality and hyponatremia: Secondary | ICD-10-CM | POA: Diagnosis not present

## 2021-07-09 DIAGNOSIS — I10 Essential (primary) hypertension: Secondary | ICD-10-CM | POA: Diagnosis not present

## 2021-07-09 DIAGNOSIS — I5030 Unspecified diastolic (congestive) heart failure: Secondary | ICD-10-CM | POA: Diagnosis not present

## 2021-07-09 DIAGNOSIS — F32A Depression, unspecified: Secondary | ICD-10-CM | POA: Diagnosis not present

## 2021-07-09 DIAGNOSIS — F419 Anxiety disorder, unspecified: Secondary | ICD-10-CM | POA: Diagnosis not present

## 2021-07-09 DIAGNOSIS — R410 Disorientation, unspecified: Secondary | ICD-10-CM | POA: Diagnosis not present

## 2021-07-09 DIAGNOSIS — E871 Hypo-osmolality and hyponatremia: Secondary | ICD-10-CM | POA: Diagnosis not present

## 2021-07-10 DIAGNOSIS — I10 Essential (primary) hypertension: Secondary | ICD-10-CM | POA: Diagnosis not present

## 2021-07-10 DIAGNOSIS — I5030 Unspecified diastolic (congestive) heart failure: Secondary | ICD-10-CM | POA: Diagnosis not present

## 2021-07-10 DIAGNOSIS — E871 Hypo-osmolality and hyponatremia: Secondary | ICD-10-CM | POA: Diagnosis not present

## 2021-07-10 DIAGNOSIS — F32A Depression, unspecified: Secondary | ICD-10-CM | POA: Diagnosis not present

## 2021-07-10 DIAGNOSIS — R410 Disorientation, unspecified: Secondary | ICD-10-CM | POA: Diagnosis not present

## 2021-07-10 DIAGNOSIS — F419 Anxiety disorder, unspecified: Secondary | ICD-10-CM | POA: Diagnosis not present

## 2021-07-11 DIAGNOSIS — E871 Hypo-osmolality and hyponatremia: Secondary | ICD-10-CM | POA: Diagnosis not present

## 2021-07-11 DIAGNOSIS — I10 Essential (primary) hypertension: Secondary | ICD-10-CM | POA: Diagnosis not present

## 2021-07-11 DIAGNOSIS — I5030 Unspecified diastolic (congestive) heart failure: Secondary | ICD-10-CM | POA: Diagnosis not present

## 2021-07-11 DIAGNOSIS — F32A Depression, unspecified: Secondary | ICD-10-CM | POA: Diagnosis not present

## 2021-07-11 DIAGNOSIS — R410 Disorientation, unspecified: Secondary | ICD-10-CM | POA: Diagnosis not present

## 2021-07-11 DIAGNOSIS — F419 Anxiety disorder, unspecified: Secondary | ICD-10-CM | POA: Diagnosis not present

## 2021-07-14 DIAGNOSIS — E871 Hypo-osmolality and hyponatremia: Secondary | ICD-10-CM | POA: Diagnosis not present

## 2021-07-14 DIAGNOSIS — I10 Essential (primary) hypertension: Secondary | ICD-10-CM | POA: Diagnosis not present

## 2021-07-14 DIAGNOSIS — I5030 Unspecified diastolic (congestive) heart failure: Secondary | ICD-10-CM | POA: Diagnosis not present

## 2021-07-14 DIAGNOSIS — R410 Disorientation, unspecified: Secondary | ICD-10-CM | POA: Diagnosis not present

## 2021-07-14 DIAGNOSIS — F32A Depression, unspecified: Secondary | ICD-10-CM | POA: Diagnosis not present

## 2021-07-14 DIAGNOSIS — F419 Anxiety disorder, unspecified: Secondary | ICD-10-CM | POA: Diagnosis not present

## 2021-07-15 DIAGNOSIS — F419 Anxiety disorder, unspecified: Secondary | ICD-10-CM | POA: Diagnosis not present

## 2021-07-15 DIAGNOSIS — I5030 Unspecified diastolic (congestive) heart failure: Secondary | ICD-10-CM | POA: Diagnosis not present

## 2021-07-15 DIAGNOSIS — F32A Depression, unspecified: Secondary | ICD-10-CM | POA: Diagnosis not present

## 2021-07-15 DIAGNOSIS — I10 Essential (primary) hypertension: Secondary | ICD-10-CM | POA: Diagnosis not present

## 2021-07-15 DIAGNOSIS — R410 Disorientation, unspecified: Secondary | ICD-10-CM | POA: Diagnosis not present

## 2021-07-15 DIAGNOSIS — E871 Hypo-osmolality and hyponatremia: Secondary | ICD-10-CM | POA: Diagnosis not present

## 2021-07-16 DIAGNOSIS — I5030 Unspecified diastolic (congestive) heart failure: Secondary | ICD-10-CM | POA: Diagnosis not present

## 2021-07-16 DIAGNOSIS — F32A Depression, unspecified: Secondary | ICD-10-CM | POA: Diagnosis not present

## 2021-07-16 DIAGNOSIS — E871 Hypo-osmolality and hyponatremia: Secondary | ICD-10-CM | POA: Diagnosis not present

## 2021-07-16 DIAGNOSIS — R410 Disorientation, unspecified: Secondary | ICD-10-CM | POA: Diagnosis not present

## 2021-07-16 DIAGNOSIS — F419 Anxiety disorder, unspecified: Secondary | ICD-10-CM | POA: Diagnosis not present

## 2021-07-16 DIAGNOSIS — I10 Essential (primary) hypertension: Secondary | ICD-10-CM | POA: Diagnosis not present

## 2021-07-17 DIAGNOSIS — I10 Essential (primary) hypertension: Secondary | ICD-10-CM | POA: Diagnosis not present

## 2021-07-17 DIAGNOSIS — R410 Disorientation, unspecified: Secondary | ICD-10-CM | POA: Diagnosis not present

## 2021-07-17 DIAGNOSIS — I5030 Unspecified diastolic (congestive) heart failure: Secondary | ICD-10-CM | POA: Diagnosis not present

## 2021-07-17 DIAGNOSIS — F32A Depression, unspecified: Secondary | ICD-10-CM | POA: Diagnosis not present

## 2021-07-17 DIAGNOSIS — E871 Hypo-osmolality and hyponatremia: Secondary | ICD-10-CM | POA: Diagnosis not present

## 2021-07-17 DIAGNOSIS — F419 Anxiety disorder, unspecified: Secondary | ICD-10-CM | POA: Diagnosis not present

## 2021-07-21 DIAGNOSIS — M150 Primary generalized (osteo)arthritis: Secondary | ICD-10-CM | POA: Diagnosis not present

## 2021-07-21 DIAGNOSIS — G894 Chronic pain syndrome: Secondary | ICD-10-CM | POA: Diagnosis not present

## 2021-07-21 DIAGNOSIS — L309 Dermatitis, unspecified: Secondary | ICD-10-CM | POA: Diagnosis not present

## 2021-07-21 DIAGNOSIS — F32A Depression, unspecified: Secondary | ICD-10-CM | POA: Diagnosis not present

## 2021-07-21 DIAGNOSIS — F419 Anxiety disorder, unspecified: Secondary | ICD-10-CM | POA: Diagnosis not present

## 2021-07-21 DIAGNOSIS — I10 Essential (primary) hypertension: Secondary | ICD-10-CM | POA: Diagnosis not present

## 2021-07-21 DIAGNOSIS — I5030 Unspecified diastolic (congestive) heart failure: Secondary | ICD-10-CM | POA: Diagnosis not present

## 2021-07-21 DIAGNOSIS — R410 Disorientation, unspecified: Secondary | ICD-10-CM | POA: Diagnosis not present

## 2021-07-21 DIAGNOSIS — E871 Hypo-osmolality and hyponatremia: Secondary | ICD-10-CM | POA: Diagnosis not present

## 2021-07-22 DIAGNOSIS — R410 Disorientation, unspecified: Secondary | ICD-10-CM | POA: Diagnosis not present

## 2021-07-22 DIAGNOSIS — I5030 Unspecified diastolic (congestive) heart failure: Secondary | ICD-10-CM | POA: Diagnosis not present

## 2021-07-22 DIAGNOSIS — I10 Essential (primary) hypertension: Secondary | ICD-10-CM | POA: Diagnosis not present

## 2021-07-22 DIAGNOSIS — E871 Hypo-osmolality and hyponatremia: Secondary | ICD-10-CM | POA: Diagnosis not present

## 2021-07-24 DIAGNOSIS — I5030 Unspecified diastolic (congestive) heart failure: Secondary | ICD-10-CM | POA: Diagnosis not present

## 2021-07-24 DIAGNOSIS — F32A Depression, unspecified: Secondary | ICD-10-CM | POA: Diagnosis not present

## 2021-07-24 DIAGNOSIS — I10 Essential (primary) hypertension: Secondary | ICD-10-CM | POA: Diagnosis not present

## 2021-07-24 DIAGNOSIS — E871 Hypo-osmolality and hyponatremia: Secondary | ICD-10-CM | POA: Diagnosis not present

## 2021-07-24 DIAGNOSIS — R410 Disorientation, unspecified: Secondary | ICD-10-CM | POA: Diagnosis not present

## 2021-07-24 DIAGNOSIS — F419 Anxiety disorder, unspecified: Secondary | ICD-10-CM | POA: Diagnosis not present

## 2021-07-28 DIAGNOSIS — I10 Essential (primary) hypertension: Secondary | ICD-10-CM | POA: Diagnosis not present

## 2021-07-28 DIAGNOSIS — F32A Depression, unspecified: Secondary | ICD-10-CM | POA: Diagnosis not present

## 2021-07-28 DIAGNOSIS — R410 Disorientation, unspecified: Secondary | ICD-10-CM | POA: Diagnosis not present

## 2021-07-28 DIAGNOSIS — F419 Anxiety disorder, unspecified: Secondary | ICD-10-CM | POA: Diagnosis not present

## 2021-07-28 DIAGNOSIS — I5030 Unspecified diastolic (congestive) heart failure: Secondary | ICD-10-CM | POA: Diagnosis not present

## 2021-07-28 DIAGNOSIS — E871 Hypo-osmolality and hyponatremia: Secondary | ICD-10-CM | POA: Diagnosis not present

## 2021-07-31 DIAGNOSIS — I5030 Unspecified diastolic (congestive) heart failure: Secondary | ICD-10-CM | POA: Diagnosis not present

## 2021-07-31 DIAGNOSIS — R410 Disorientation, unspecified: Secondary | ICD-10-CM | POA: Diagnosis not present

## 2021-07-31 DIAGNOSIS — F32A Depression, unspecified: Secondary | ICD-10-CM | POA: Diagnosis not present

## 2021-07-31 DIAGNOSIS — E871 Hypo-osmolality and hyponatremia: Secondary | ICD-10-CM | POA: Diagnosis not present

## 2021-07-31 DIAGNOSIS — F419 Anxiety disorder, unspecified: Secondary | ICD-10-CM | POA: Diagnosis not present

## 2021-07-31 DIAGNOSIS — I10 Essential (primary) hypertension: Secondary | ICD-10-CM | POA: Diagnosis not present

## 2021-08-04 DIAGNOSIS — R410 Disorientation, unspecified: Secondary | ICD-10-CM | POA: Diagnosis not present

## 2021-08-04 DIAGNOSIS — E871 Hypo-osmolality and hyponatremia: Secondary | ICD-10-CM | POA: Diagnosis not present

## 2021-08-04 DIAGNOSIS — I5030 Unspecified diastolic (congestive) heart failure: Secondary | ICD-10-CM | POA: Diagnosis not present

## 2021-08-04 DIAGNOSIS — I10 Essential (primary) hypertension: Secondary | ICD-10-CM | POA: Diagnosis not present

## 2021-08-04 DIAGNOSIS — F419 Anxiety disorder, unspecified: Secondary | ICD-10-CM | POA: Diagnosis not present

## 2021-08-04 DIAGNOSIS — F32A Depression, unspecified: Secondary | ICD-10-CM | POA: Diagnosis not present

## 2021-08-05 DIAGNOSIS — R410 Disorientation, unspecified: Secondary | ICD-10-CM | POA: Diagnosis not present

## 2021-08-05 DIAGNOSIS — K5790 Diverticulosis of intestine, part unspecified, without perforation or abscess without bleeding: Secondary | ICD-10-CM | POA: Diagnosis not present

## 2021-08-05 DIAGNOSIS — F419 Anxiety disorder, unspecified: Secondary | ICD-10-CM | POA: Diagnosis not present

## 2021-08-05 DIAGNOSIS — M419 Scoliosis, unspecified: Secondary | ICD-10-CM | POA: Diagnosis not present

## 2021-08-05 DIAGNOSIS — I10 Essential (primary) hypertension: Secondary | ICD-10-CM | POA: Diagnosis not present

## 2021-08-05 DIAGNOSIS — M810 Age-related osteoporosis without current pathological fracture: Secondary | ICD-10-CM | POA: Diagnosis not present

## 2021-08-05 DIAGNOSIS — E871 Hypo-osmolality and hyponatremia: Secondary | ICD-10-CM | POA: Diagnosis not present

## 2021-08-05 DIAGNOSIS — W19XXXD Unspecified fall, subsequent encounter: Secondary | ICD-10-CM | POA: Diagnosis not present

## 2021-08-05 DIAGNOSIS — D630 Anemia in neoplastic disease: Secondary | ICD-10-CM | POA: Diagnosis not present

## 2021-08-05 DIAGNOSIS — M1990 Unspecified osteoarthritis, unspecified site: Secondary | ICD-10-CM | POA: Diagnosis not present

## 2021-08-05 DIAGNOSIS — F32A Depression, unspecified: Secondary | ICD-10-CM | POA: Diagnosis not present

## 2021-08-05 DIAGNOSIS — K219 Gastro-esophageal reflux disease without esophagitis: Secondary | ICD-10-CM | POA: Diagnosis not present

## 2021-08-05 DIAGNOSIS — M4800 Spinal stenosis, site unspecified: Secondary | ICD-10-CM | POA: Diagnosis not present

## 2021-08-05 DIAGNOSIS — M545 Low back pain, unspecified: Secondary | ICD-10-CM | POA: Diagnosis not present

## 2021-08-05 DIAGNOSIS — C50911 Malignant neoplasm of unspecified site of right female breast: Secondary | ICD-10-CM | POA: Diagnosis not present

## 2021-08-05 DIAGNOSIS — G4700 Insomnia, unspecified: Secondary | ICD-10-CM | POA: Diagnosis not present

## 2021-08-05 DIAGNOSIS — Z9181 History of falling: Secondary | ICD-10-CM | POA: Diagnosis not present

## 2021-08-05 DIAGNOSIS — I5030 Unspecified diastolic (congestive) heart failure: Secondary | ICD-10-CM | POA: Diagnosis not present

## 2021-08-05 DIAGNOSIS — Z79810 Long term (current) use of selective estrogen receptor modulators (SERMs): Secondary | ICD-10-CM | POA: Diagnosis not present

## 2021-08-05 DIAGNOSIS — G8929 Other chronic pain: Secondary | ICD-10-CM | POA: Diagnosis not present

## 2021-08-07 DIAGNOSIS — I10 Essential (primary) hypertension: Secondary | ICD-10-CM | POA: Diagnosis not present

## 2021-08-07 DIAGNOSIS — F419 Anxiety disorder, unspecified: Secondary | ICD-10-CM | POA: Diagnosis not present

## 2021-08-07 DIAGNOSIS — E871 Hypo-osmolality and hyponatremia: Secondary | ICD-10-CM | POA: Diagnosis not present

## 2021-08-07 DIAGNOSIS — I5030 Unspecified diastolic (congestive) heart failure: Secondary | ICD-10-CM | POA: Diagnosis not present

## 2021-08-07 DIAGNOSIS — R410 Disorientation, unspecified: Secondary | ICD-10-CM | POA: Diagnosis not present

## 2021-08-07 DIAGNOSIS — F32A Depression, unspecified: Secondary | ICD-10-CM | POA: Diagnosis not present

## 2021-08-12 DIAGNOSIS — R609 Edema, unspecified: Secondary | ICD-10-CM | POA: Diagnosis not present

## 2021-08-12 DIAGNOSIS — B354 Tinea corporis: Secondary | ICD-10-CM | POA: Diagnosis not present

## 2021-08-12 DIAGNOSIS — G894 Chronic pain syndrome: Secondary | ICD-10-CM | POA: Diagnosis not present

## 2021-08-12 DIAGNOSIS — I872 Venous insufficiency (chronic) (peripheral): Secondary | ICD-10-CM | POA: Diagnosis not present

## 2021-08-13 DIAGNOSIS — F32A Depression, unspecified: Secondary | ICD-10-CM | POA: Diagnosis not present

## 2021-08-13 DIAGNOSIS — I5030 Unspecified diastolic (congestive) heart failure: Secondary | ICD-10-CM | POA: Diagnosis not present

## 2021-08-13 DIAGNOSIS — F419 Anxiety disorder, unspecified: Secondary | ICD-10-CM | POA: Diagnosis not present

## 2021-08-13 DIAGNOSIS — R410 Disorientation, unspecified: Secondary | ICD-10-CM | POA: Diagnosis not present

## 2021-08-13 DIAGNOSIS — I10 Essential (primary) hypertension: Secondary | ICD-10-CM | POA: Diagnosis not present

## 2021-08-13 DIAGNOSIS — E871 Hypo-osmolality and hyponatremia: Secondary | ICD-10-CM | POA: Diagnosis not present

## 2021-08-14 DIAGNOSIS — F32A Depression, unspecified: Secondary | ICD-10-CM | POA: Diagnosis not present

## 2021-08-14 DIAGNOSIS — F419 Anxiety disorder, unspecified: Secondary | ICD-10-CM | POA: Diagnosis not present

## 2021-08-14 DIAGNOSIS — I5030 Unspecified diastolic (congestive) heart failure: Secondary | ICD-10-CM | POA: Diagnosis not present

## 2021-08-14 DIAGNOSIS — I10 Essential (primary) hypertension: Secondary | ICD-10-CM | POA: Diagnosis not present

## 2021-08-14 DIAGNOSIS — R410 Disorientation, unspecified: Secondary | ICD-10-CM | POA: Diagnosis not present

## 2021-08-14 DIAGNOSIS — E871 Hypo-osmolality and hyponatremia: Secondary | ICD-10-CM | POA: Diagnosis not present

## 2021-08-15 DIAGNOSIS — E871 Hypo-osmolality and hyponatremia: Secondary | ICD-10-CM | POA: Diagnosis not present

## 2021-08-15 DIAGNOSIS — I10 Essential (primary) hypertension: Secondary | ICD-10-CM | POA: Diagnosis not present

## 2021-08-15 DIAGNOSIS — R410 Disorientation, unspecified: Secondary | ICD-10-CM | POA: Diagnosis not present

## 2021-08-15 DIAGNOSIS — F32A Depression, unspecified: Secondary | ICD-10-CM | POA: Diagnosis not present

## 2021-08-15 DIAGNOSIS — F419 Anxiety disorder, unspecified: Secondary | ICD-10-CM | POA: Diagnosis not present

## 2021-08-15 DIAGNOSIS — I5030 Unspecified diastolic (congestive) heart failure: Secondary | ICD-10-CM | POA: Diagnosis not present

## 2021-08-18 DIAGNOSIS — I5030 Unspecified diastolic (congestive) heart failure: Secondary | ICD-10-CM | POA: Diagnosis not present

## 2021-08-18 DIAGNOSIS — L308 Other specified dermatitis: Secondary | ICD-10-CM | POA: Diagnosis not present

## 2021-08-18 DIAGNOSIS — L821 Other seborrheic keratosis: Secondary | ICD-10-CM | POA: Diagnosis not present

## 2021-08-18 DIAGNOSIS — E871 Hypo-osmolality and hyponatremia: Secondary | ICD-10-CM | POA: Diagnosis not present

## 2021-08-18 DIAGNOSIS — F419 Anxiety disorder, unspecified: Secondary | ICD-10-CM | POA: Diagnosis not present

## 2021-08-18 DIAGNOSIS — I872 Venous insufficiency (chronic) (peripheral): Secondary | ICD-10-CM | POA: Diagnosis not present

## 2021-08-18 DIAGNOSIS — R410 Disorientation, unspecified: Secondary | ICD-10-CM | POA: Diagnosis not present

## 2021-08-18 DIAGNOSIS — I10 Essential (primary) hypertension: Secondary | ICD-10-CM | POA: Diagnosis not present

## 2021-08-18 DIAGNOSIS — F32A Depression, unspecified: Secondary | ICD-10-CM | POA: Diagnosis not present

## 2021-08-19 ENCOUNTER — Other Ambulatory Visit: Payer: Self-pay | Admitting: Hematology and Oncology

## 2021-08-19 DIAGNOSIS — I5030 Unspecified diastolic (congestive) heart failure: Secondary | ICD-10-CM | POA: Diagnosis not present

## 2021-08-19 DIAGNOSIS — F419 Anxiety disorder, unspecified: Secondary | ICD-10-CM | POA: Diagnosis not present

## 2021-08-19 DIAGNOSIS — E871 Hypo-osmolality and hyponatremia: Secondary | ICD-10-CM | POA: Diagnosis not present

## 2021-08-19 DIAGNOSIS — F32A Depression, unspecified: Secondary | ICD-10-CM | POA: Diagnosis not present

## 2021-08-19 DIAGNOSIS — R410 Disorientation, unspecified: Secondary | ICD-10-CM | POA: Diagnosis not present

## 2021-08-19 DIAGNOSIS — I10 Essential (primary) hypertension: Secondary | ICD-10-CM | POA: Diagnosis not present

## 2021-08-20 DIAGNOSIS — F32A Depression, unspecified: Secondary | ICD-10-CM | POA: Diagnosis not present

## 2021-08-20 DIAGNOSIS — F419 Anxiety disorder, unspecified: Secondary | ICD-10-CM | POA: Diagnosis not present

## 2021-08-20 DIAGNOSIS — R410 Disorientation, unspecified: Secondary | ICD-10-CM | POA: Diagnosis not present

## 2021-08-20 DIAGNOSIS — E871 Hypo-osmolality and hyponatremia: Secondary | ICD-10-CM | POA: Diagnosis not present

## 2021-08-20 DIAGNOSIS — I10 Essential (primary) hypertension: Secondary | ICD-10-CM | POA: Diagnosis not present

## 2021-08-20 DIAGNOSIS — I5030 Unspecified diastolic (congestive) heart failure: Secondary | ICD-10-CM | POA: Diagnosis not present

## 2021-08-21 DIAGNOSIS — F32A Depression, unspecified: Secondary | ICD-10-CM | POA: Diagnosis not present

## 2021-08-21 DIAGNOSIS — F419 Anxiety disorder, unspecified: Secondary | ICD-10-CM | POA: Diagnosis not present

## 2021-08-21 DIAGNOSIS — R410 Disorientation, unspecified: Secondary | ICD-10-CM | POA: Diagnosis not present

## 2021-08-21 DIAGNOSIS — I10 Essential (primary) hypertension: Secondary | ICD-10-CM | POA: Diagnosis not present

## 2021-08-21 DIAGNOSIS — I5030 Unspecified diastolic (congestive) heart failure: Secondary | ICD-10-CM | POA: Diagnosis not present

## 2021-08-21 DIAGNOSIS — E871 Hypo-osmolality and hyponatremia: Secondary | ICD-10-CM | POA: Diagnosis not present

## 2021-08-25 DIAGNOSIS — R410 Disorientation, unspecified: Secondary | ICD-10-CM | POA: Diagnosis not present

## 2021-08-25 DIAGNOSIS — F419 Anxiety disorder, unspecified: Secondary | ICD-10-CM | POA: Diagnosis not present

## 2021-08-25 DIAGNOSIS — I5030 Unspecified diastolic (congestive) heart failure: Secondary | ICD-10-CM | POA: Diagnosis not present

## 2021-08-25 DIAGNOSIS — F32A Depression, unspecified: Secondary | ICD-10-CM | POA: Diagnosis not present

## 2021-08-25 DIAGNOSIS — I10 Essential (primary) hypertension: Secondary | ICD-10-CM | POA: Diagnosis not present

## 2021-08-25 DIAGNOSIS — E871 Hypo-osmolality and hyponatremia: Secondary | ICD-10-CM | POA: Diagnosis not present

## 2021-08-27 DIAGNOSIS — F419 Anxiety disorder, unspecified: Secondary | ICD-10-CM | POA: Diagnosis not present

## 2021-08-27 DIAGNOSIS — E871 Hypo-osmolality and hyponatremia: Secondary | ICD-10-CM | POA: Diagnosis not present

## 2021-08-27 DIAGNOSIS — I10 Essential (primary) hypertension: Secondary | ICD-10-CM | POA: Diagnosis not present

## 2021-08-27 DIAGNOSIS — R410 Disorientation, unspecified: Secondary | ICD-10-CM | POA: Diagnosis not present

## 2021-08-27 DIAGNOSIS — I5030 Unspecified diastolic (congestive) heart failure: Secondary | ICD-10-CM | POA: Diagnosis not present

## 2021-08-27 DIAGNOSIS — F32A Depression, unspecified: Secondary | ICD-10-CM | POA: Diagnosis not present

## 2021-08-28 DIAGNOSIS — F32A Depression, unspecified: Secondary | ICD-10-CM | POA: Diagnosis not present

## 2021-08-28 DIAGNOSIS — E871 Hypo-osmolality and hyponatremia: Secondary | ICD-10-CM | POA: Diagnosis not present

## 2021-08-28 DIAGNOSIS — F419 Anxiety disorder, unspecified: Secondary | ICD-10-CM | POA: Diagnosis not present

## 2021-08-28 DIAGNOSIS — I5030 Unspecified diastolic (congestive) heart failure: Secondary | ICD-10-CM | POA: Diagnosis not present

## 2021-08-28 DIAGNOSIS — R410 Disorientation, unspecified: Secondary | ICD-10-CM | POA: Diagnosis not present

## 2021-08-28 DIAGNOSIS — I10 Essential (primary) hypertension: Secondary | ICD-10-CM | POA: Diagnosis not present

## 2021-09-01 ENCOUNTER — Other Ambulatory Visit: Payer: Self-pay | Admitting: Surgery

## 2021-09-01 DIAGNOSIS — Z1239 Encounter for other screening for malignant neoplasm of breast: Secondary | ICD-10-CM

## 2021-09-02 DIAGNOSIS — F419 Anxiety disorder, unspecified: Secondary | ICD-10-CM | POA: Diagnosis not present

## 2021-09-02 DIAGNOSIS — I5030 Unspecified diastolic (congestive) heart failure: Secondary | ICD-10-CM | POA: Diagnosis not present

## 2021-09-02 DIAGNOSIS — F32A Depression, unspecified: Secondary | ICD-10-CM | POA: Diagnosis not present

## 2021-09-02 DIAGNOSIS — R410 Disorientation, unspecified: Secondary | ICD-10-CM | POA: Diagnosis not present

## 2021-09-02 DIAGNOSIS — E871 Hypo-osmolality and hyponatremia: Secondary | ICD-10-CM | POA: Diagnosis not present

## 2021-09-02 DIAGNOSIS — I10 Essential (primary) hypertension: Secondary | ICD-10-CM | POA: Diagnosis not present

## 2021-09-12 DIAGNOSIS — G894 Chronic pain syndrome: Secondary | ICD-10-CM | POA: Diagnosis not present

## 2021-09-12 DIAGNOSIS — E663 Overweight: Secondary | ICD-10-CM | POA: Diagnosis not present

## 2021-09-12 DIAGNOSIS — Z9229 Personal history of other drug therapy: Secondary | ICD-10-CM | POA: Diagnosis not present

## 2021-09-12 DIAGNOSIS — F5101 Primary insomnia: Secondary | ICD-10-CM | POA: Diagnosis not present

## 2021-09-12 DIAGNOSIS — F330 Major depressive disorder, recurrent, mild: Secondary | ICD-10-CM | POA: Diagnosis not present

## 2021-09-12 DIAGNOSIS — M150 Primary generalized (osteo)arthritis: Secondary | ICD-10-CM | POA: Diagnosis not present

## 2021-09-12 DIAGNOSIS — I10 Essential (primary) hypertension: Secondary | ICD-10-CM | POA: Diagnosis not present

## 2021-09-12 DIAGNOSIS — Z6825 Body mass index (BMI) 25.0-25.9, adult: Secondary | ICD-10-CM | POA: Diagnosis not present

## 2021-09-12 DIAGNOSIS — F419 Anxiety disorder, unspecified: Secondary | ICD-10-CM | POA: Diagnosis not present

## 2021-10-08 ENCOUNTER — Ambulatory Visit (INDEPENDENT_AMBULATORY_CARE_PROVIDER_SITE_OTHER): Payer: Medicare Other | Admitting: Cardiology

## 2021-10-08 ENCOUNTER — Encounter: Payer: Self-pay | Admitting: Cardiology

## 2021-10-08 VITALS — BP 138/82 | HR 86 | Ht 65.0 in | Wt 150.0 lb

## 2021-10-08 DIAGNOSIS — I10 Essential (primary) hypertension: Secondary | ICD-10-CM

## 2021-10-08 DIAGNOSIS — R600 Localized edema: Secondary | ICD-10-CM

## 2021-10-08 DIAGNOSIS — I493 Ventricular premature depolarization: Secondary | ICD-10-CM

## 2021-10-08 NOTE — Progress Notes (Signed)
Cardiology Office Note  Date: 10/08/2021   ID: Mac[place], [place] 03/30/1940, MRN [id number]  PCP:  Redmond School, MD  Cardiologist:  Rozann Lesches, MD Electrophysiologist:  None   Chief Complaint  Patient presents with   Cardiac follow-up    History of Present Illness: Marilyn Haas is an 81 y.o. female last seen in February.  She is here today with her son for a follow-up visit.  She was hospitalized back in April after a fall at home, no acute findings by head CT.  Medications were adjusted per discharge summary.  I reviewed her ECG and chest x-ray from that evaluation as well as lab work noted below.  She does not report any sense of palpitations or chest pain.  Has not had any further falls.  I reviewed her present medications.  Blood pressure initially elevated when she came in, rechecked by me in the right arm at 138/82.  She remains on Cozaar and Lasix.  Past Medical History:  Diagnosis Date   Anemia    Anxiety    Arthritis    Breast cancer ([place]) 05/19/2016   Right breast   Chronic back pain    Scoliosis, stenosis   Depression    Diverticulosis    GERD (gastroesophageal reflux disease)    Hard of hearing    History of blood transfusion    History of colon polyps    History of hiatal hernia    History of pneumonia    Hypertension    Insomnia    Osteopenia    Osteoporosis    Personal history of radiation therapy    Thickened endometrium 09/29/2017    Past Surgical History:  Procedure Laterality Date   BREAST LUMPECTOMY WITH RADIOACTIVE SEED AND SENTINEL LYMPH NODE BIOPSY Right 07/31/2016   Procedure: BREAST LUMPECTOMY WITH RADIOACTIVE SEED AND SENTINEL LYMPH NODE BIOPSY, INJECT BLUE DYE RIGHT BREAST;  Surgeon: Fanny Skates, MD;  Location: [facility];  Service: General;  Laterality: Right;   BREAST LUMPECTOMY WITH RADIOACTIVE SEED LOCALIZATION Left 05/07/2021   Procedure: LEFT BREAST LUMPECTOMY WITH RADIOACTIVE SEED LOCALIZATION;  Surgeon: Erroll Luna, MD;  Location: [facility];  Service: General;  Laterality: Left;   COLONOSCOPY     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 07/04/2014   Procedure: LEFT CONVERSION TO TOTAL HIP ARTHROPLASTY;  Surgeon: Gaynelle Arabian, MD;  Location[facility];  Service: Orthopedics;  Laterality: Left;   ESOPHAGEAL DILATION N/A 09/24/2017   Procedure: ESOPHAGEAL DILATION;  Surgeon: Rogene Houston, MD;  [facility];  Service: Endoscopy;  Laterality: N/A;   ESOPHAGEAL DILATION N/A 01/19/2020   Procedure: ESOPHAGEAL DILATION;  Surgeon: Harvel Quale, MD;  [facility];  Service: Gastroenterology;  Laterality: N/A;   ESOPHAGEAL DILATION  02/28/2020   Procedure: ESOPHAGEAL DILATION;  Surgeon: Rogene Houston, MD;  [facility];  Service: Endoscopy;;   ESOPHAGOGASTRODUODENOSCOPY N/A 01/26/2014   Procedure: ESOPHAGOGASTRODUODENOSCOPY (EGD);  Surgeon: Rogene Houston, MD;  [facility];  Service: Endoscopy;  Laterality: N/A;  1030   ESOPHAGOGASTRODUODENOSCOPY (EGD) WITH PROPOFOL N/A 09/24/2017   Procedure: ESOPHAGOGASTRODUODENOSCOPY (EGD) WITH PROPOFOL;  Surgeon: Rogene Houston, MD;  [facility];  Service: Endoscopy;  Laterality: N/A;  11:15   ESOPHAGOGASTRODUODENOSCOPY (EGD) WITH PROPOFOL N/A 01/19/2020   Procedure: ESOPHAGOGASTRODUODENOSCOPY (EGD) WITH PROPOFOL;  Surgeon: Harvel Quale, MD;  [facility];  Service: Gastroenterology;  Laterality: N/A;  1200   ESOPHAGOGASTRODUODENOSCOPY (EGD) WITH PROPOFOL  N/A 02/28/2020   Procedure: ESOPHAGOGASTRODUODENOSCOPY (EGD) WITH PROPOFOL;  Surgeon: Rogene Houston, MD;  [facility];  Service: Endoscopy;  Laterality: N/A;  2:45   HIP FRACTURE SURGERY     05/2009 left -[place]   HYSTEROSCOPY WITH D & C  05/24/2018   Procedure: DILATATION AND CURETTAGE /HYSTEROSCOPY (PROCEDURE # 2)/PAP SMEAR (PROCEDURE #1);  Surgeon: Jonnie Kind, MD;  Location[facility];  Service:  Gynecology;;   Venia Minks DILATION N/A 01/26/2014   Procedure: Keturah Shavers;  Surgeon: Rogene Houston, MD;  [facility];  Service: Endoscopy;  Laterality: N/A;    Current Outpatient Medications  Medication Sig Dispense Refill   ARIPiprazole (ABILIFY) 2 MG tablet Take 2 mg by mouth daily.     Cholecalciferol (VITAMIN D3) 125 MCG (5000 UT) CAPS Take 1 capsule by mouth daily.     DULoxetine (CYMBALTA) 60 MG capsule Take 120 mg by mouth daily.     furosemide (LASIX) 20 MG tablet Take 20 mg by mouth daily as needed.     losartan (COZAAR) 25 MG tablet Take 1 tablet (25 mg total) by mouth 2 (two) times daily. 180 tablet 3   omeprazole (PRILOSEC) 20 MG capsule Take 1 capsule (20 mg total) by mouth 2 (two) times daily before a meal. (Patient taking differently: Take 20 mg by mouth daily.) 60 capsule 5   psyllium (METAMUCIL SMOOTH TEXTURE) 58.6 % powder Take 1 packet by mouth at bedtime.  12   tamoxifen (NOLVADEX) 20 MG tablet TAKE ONE TABLET (20 MG TOTAL) BY MOUTH DAILY. 90 tablet 1   zinc gluconate 50 MG tablet Take 50 mg by mouth daily.     ALPRAZolam (XANAX) 0.25 MG tablet Take 0.25-0.5 mg by mouth daily.     No current facility-administered medications for this visit.   Allergies:  Avelox [moxifloxacin hcl in nacl]   ROS: No orthopnea or PND.  No syncope.  Physical Exam: VS:  BP 138/82   Pulse 86   Ht '5\' 5"'$  (1.651 m)   Wt 150 lb (68 kg)   SpO2 99%   BMI 24.96 kg/m , BMI Body mass index is 24.96 kg/m.  Wt Readings from Last 3 Encounters:  10/08/21 150 lb (68 kg)  07/04/21 145 lb (65.8 kg)  05/07/21 150 lb 2.1 oz (68.1 kg)    General: Patient appears comfortable at rest. HEENT: Conjunctiva and lids normal, oropharynx clear. Neck: Supple, no elevated JVP or carotid bruits, no thyromegaly. Lungs: Clear to auscultation, nonlabored breathing at rest. Cardiac: Regular rate and rhythm, no S3, 1/6 systolic murmur. Extremities: No pitting edema.  ECG:  An ECG dated  11/12/2020 was personally reviewed today and demonstrated:  Sinus rhythm.  Recent Labwork: 07/05/2021: ALT 10; AST 17; BUN 9; Creatinine, Ser 0.89; Hemoglobin 9.9; Magnesium 2.1; Platelets 276; Potassium 4.2; Sodium 125     Component Value Date/Time   CHOL 209 (H) 12/13/2014 1132   CHOL 150 02/17/2013 0934   TRIG 95 12/13/2014 1132   TRIG 111 02/17/2013 0934   HDL 114 12/13/2014 1132   HDL 40 02/17/2013 0934   CHOLHDL 1.8 12/13/2014 1132   VLDL 19 12/13/2014 1132   LDLCALC 76 12/13/2014 1132   LDLCALC 88 02/17/2013 0934    Other Studies Reviewed Today:  Chest x-ray 07/04/2021: FINDINGS: Transverse diameter of heart is within normal limits. There is soft tissue fullness in the retrocardiac region, possibly suggesting fixed hiatal hernia with interval increase in size. There are no signs of  alveolar pulmonary edema or focal pulmonary consolidation. There is no significant pleural effusion or pneumothorax.   IMPRESSION: There are no signs of pulmonary edema or focal pulmonary consolidation. There is soft tissue fullness in the retrocardiac region suggesting fixed hiatal hernia with interval increase in size.  Assessment and Plan:  1.  History of PVCs, quiescent, heart rate regular today with no ectopy.  She does not report any palpitations or syncope.  2.  Essential hypertension, continue Cozaar as before and keep follow-up with Dr. Riley Kill.  Blood pressure rechecked by me at 138/82 today.  3.  Intermittent leg edema, continues on Lasix as needed.  Renal function and potassium normal by lab work in April.  Medication Adjustments/Labs and Tests Ordered: Current medicines are reviewed at length with the patient today.  Concerns regarding medicines are outlined above.   Tests Ordered: No orders of the defined types were placed in this encounter.   Medication Changes: No orders of the defined types were placed in this encounter.   Disposition:  Follow up  6  months.  Signed, Marilyn Sark, MD, [employer] 10/08/2021 2:45 PM    [facility] [address] [place], [place] [address] Phone: [phone]; Fax: [phone]

## 2021-10-08 NOTE — Patient Instructions (Addendum)
Medication Instructions:  Your physician recommends that you continue on your current medications as directed. Please refer to the Current Medication list given to you today.   Labwork: None today  Testing/Procedures: None today  Follow-Up: 6 months with Dr.McDowell  Any Other Special Instructions Will Be Listed Below (If Applicable).  If you need a refill on your cardiac medications before your next appointment, please call your pharmacy.   [employer] blood pressure monitoring system

## 2021-10-10 DIAGNOSIS — F419 Anxiety disorder, unspecified: Secondary | ICD-10-CM | POA: Diagnosis not present

## 2021-10-10 DIAGNOSIS — G894 Chronic pain syndrome: Secondary | ICD-10-CM | POA: Diagnosis not present

## 2021-10-10 DIAGNOSIS — T8484XA Pain due to internal orthopedic prosthetic devices, implants and grafts, initial encounter: Secondary | ICD-10-CM | POA: Diagnosis not present

## 2021-10-10 DIAGNOSIS — M150 Primary generalized (osteo)arthritis: Secondary | ICD-10-CM | POA: Diagnosis not present

## 2021-10-17 DIAGNOSIS — H903 Sensorineural hearing loss, bilateral: Secondary | ICD-10-CM | POA: Diagnosis not present

## 2021-10-17 DIAGNOSIS — H838X3 Other specified diseases of inner ear, bilateral: Secondary | ICD-10-CM | POA: Diagnosis not present

## 2021-10-27 ENCOUNTER — Other Ambulatory Visit: Payer: Self-pay

## 2021-10-27 DIAGNOSIS — Z170 Estrogen receptor positive status [ER+]: Secondary | ICD-10-CM

## 2021-10-28 ENCOUNTER — Inpatient Hospital Stay (HOSPITAL_BASED_OUTPATIENT_CLINIC_OR_DEPARTMENT_OTHER): Payer: Medicare Other | Admitting: Hematology and Oncology

## 2021-10-28 ENCOUNTER — Inpatient Hospital Stay: Payer: Medicare Other | Attending: Hematology and Oncology

## 2021-10-28 ENCOUNTER — Other Ambulatory Visit: Payer: Self-pay

## 2021-10-28 VITALS — BP 162/87 | HR 84 | Temp 97.7°F | Resp 18 | Ht 65.0 in | Wt 149.5 lb

## 2021-10-28 DIAGNOSIS — C50411 Malignant neoplasm of upper-outer quadrant of right female breast: Secondary | ICD-10-CM

## 2021-10-28 DIAGNOSIS — Z923 Personal history of irradiation: Secondary | ICD-10-CM | POA: Diagnosis not present

## 2021-10-28 DIAGNOSIS — Z79810 Long term (current) use of selective estrogen receptor modulators (SERMs): Secondary | ICD-10-CM | POA: Insufficient documentation

## 2021-10-28 DIAGNOSIS — E871 Hypo-osmolality and hyponatremia: Secondary | ICD-10-CM | POA: Diagnosis not present

## 2021-10-28 DIAGNOSIS — Z170 Estrogen receptor positive status [ER+]: Secondary | ICD-10-CM

## 2021-10-28 DIAGNOSIS — Z803 Family history of malignant neoplasm of breast: Secondary | ICD-10-CM | POA: Insufficient documentation

## 2021-10-28 DIAGNOSIS — Z87891 Personal history of nicotine dependence: Secondary | ICD-10-CM | POA: Diagnosis not present

## 2021-10-28 LAB — CMP (CANCER CENTER ONLY)
ALT: 8 U/L (ref 0–44)
AST: 14 U/L — ABNORMAL LOW (ref 15–41)
Albumin: 4 g/dL (ref 3.5–5.0)
Alkaline Phosphatase: 72 U/L (ref 38–126)
Anion gap: 4 — ABNORMAL LOW (ref 5–15)
BUN: 10 mg/dL (ref 8–23)
CO2: 24 mmol/L (ref 22–32)
Calcium: 8.7 mg/dL — ABNORMAL LOW (ref 8.9–10.3)
Chloride: 93 mmol/L — ABNORMAL LOW (ref 98–111)
Creatinine: 1.03 mg/dL — ABNORMAL HIGH (ref 0.44–1.00)
GFR, Estimated: 54 mL/min — ABNORMAL LOW (ref >60)
Glucose, Bld: 103 mg/dL — ABNORMAL HIGH (ref 70–99)
Potassium: 4.7 mmol/L (ref 3.5–5.1)
Sodium: 121 mmol/L — ABNORMAL LOW (ref 135–145)
Total Bilirubin: 0.4 mg/dL (ref 0.3–1.2)
Total Protein: 7.1 g/dL (ref 6.5–8.1)

## 2021-10-28 LAB — CBC WITH DIFFERENTIAL (CANCER CENTER ONLY)
Abs Immature Granulocytes: 0.08 10*3/uL — ABNORMAL HIGH (ref 0.00–0.07)
Basophils Absolute: 0.1 10*3/uL (ref 0.0–0.1)
Basophils Relative: 1 %
Eosinophils Absolute: 0.1 10*3/uL (ref 0.0–0.5)
Eosinophils Relative: 1 %
HCT: 32.1 % — ABNORMAL LOW (ref 36.0–46.0)
Hemoglobin: 11.4 g/dL — ABNORMAL LOW (ref 12.0–15.0)
Immature Granulocytes: 1 %
Lymphocytes Relative: 18 %
Lymphs Abs: 1.2 10*3/uL (ref 0.7–4.0)
MCH: 31.7 pg (ref 26.0–34.0)
MCHC: 35.5 g/dL (ref 30.0–36.0)
MCV: 89.2 fL (ref 80.0–100.0)
Monocytes Absolute: 0.7 10*3/uL (ref 0.1–1.0)
Monocytes Relative: 10 %
Neutro Abs: 4.6 10*3/uL (ref 1.7–7.7)
Neutrophils Relative %: 69 %
Platelet Count: 262 10*3/uL (ref 150–400)
RBC: 3.6 MIL/uL — ABNORMAL LOW (ref 3.87–5.11)
RDW: 12.5 % (ref 11.5–15.5)
WBC Count: 6.7 10*3/uL (ref 4.0–10.5)
nRBC: 0 % (ref 0.0–0.2)

## 2021-10-28 MED ORDER — TAMOXIFEN CITRATE 10 MG PO TABS: 10.0000 mg | ORAL_TABLET | ORAL | 3 refills | Status: DC

## 2021-10-28 NOTE — Progress Notes (Unsigned)
[place]  Telephone:([phone]) [phone] Fax:(336) [phone]     ID: [name] DOB: 1940-09-16  MR#: [id number]  XYB#:[id number]  Patient Care Team: Redmond School, MD as PCP - General (Internal Medicine) Satira Sark, MD as PCP - Cardiology (Cardiology) Magrinat, Virgie Dad, MD (Inactive) as Consulting Physician (Oncology) Fanny Skates, MD as Consulting Physician (General Surgery) Kyung Rudd, MD as Consulting Physician (Radiation Oncology) Rogene Houston, MD as Consulting Physician (Gastroenterology) Gaynelle Arabian, MD as Consulting Physician (Orthopedic Surgery) Delice Bison, Charlestine Massed, NP as Nurse Practitioner (Hematology and Oncology) Jonnie Kind, MD (Inactive) as Consulting Physician (Obstetrics and Gynecology) OTHER MD:  CHIEF COMPLAINT: Estrogen receptor positive lobular breast cancer  CURRENT TREATMENT: tamoxifen   INTERVAL HISTORY: Marilyn Haas returns today for follow-up of her estrogen receptor positive breast cancer.  She is accompanied by her son Marilyn Haas  She continues on Tamoxifen with good tolerance. She reports occasional hot flashes, but they are not an issue. She reports vaginal wetness and wears a pad.  Since her last visit, she underwent bilateral diagnostic mammography with tomography and left breast ultrasonography at [facility] on 02/20/2021 showing: breast density category C; focal asymmetry/possible distortion within upper-outer left breast, without sonographic correlate; no abnormal-appearing left axillary lymph nodes; right lumpectomy changes without suspicious right breast findings.  She is scheduled for left breast biopsy on 03/06/2021.   REVIEW OF SYSTEMS: Marilyn Haas tells me she is sad because her husband died although that was 4 years ago, and that is the reason she is not exercising.  She says she would like bike that she could use in the house.  She tells me she has a little bit of discomfort in the right breast at  times.  A detailed review of systems today was otherwise noncontributory   COVID 19 VACCINATION STATUS: .  She had "all 5 vaccines" as of December 2022    BREAST CANCER HISTORY: From the original intake note:  The patient had bilateral screening mammography at the [facility] 04/22/2016 showing an area of possible asymmetry and calcifications in the right breast. Right diagnostic mammography with tomography and right breast ultrasonography 05/12/2016 showed the breast density to be category C. In the right breast upper outer quadrant there was a. A real lower area of architectural distortion which was palpable in the 11:00 radians 1 cm from the nipple. Ultrasonography confirmed an irregular hypoechoic mass at this location measuring 1.4 cm. Ultrasound of the right axilla was unremarkable.  Biopsy of the right breast mass in question 05/19/2016 showed ([id number]) and invasive lobular carcinoma, E-cadherin negative, estrogen receptor 90% positive, progesterone receptor 15% positive, both with strong staining intensity, with an MIB-1 of 5%, and no HER-2 amplification, the signals ratio being 1.51 and the number per cell 3.10.  Breast MRI 06/08/2016 confirmed an irregular enhancing mass in the upper-outer quadrant of the right breast measuring 1.5 cm, with adjacent biopsy clip artifact. There was an area of contiguous non-mass like enhancement bringing the total dimension to 1.9 cm. However there was no evidence of multicentric disease and no evidence of metastatic lymphadenopathy.  The patient's subsequent history is as detailed below   PAST MEDICAL HISTORY: Past Medical History:  Diagnosis Date   Anemia    Anxiety    Arthritis    Breast cancer ([place]) 05/19/2016   Right breast   Chronic back pain    Scoliosis, stenosis   Depression    Diverticulosis    GERD (gastroesophageal reflux disease)  Hard of hearing    History of blood transfusion    History of colon polyps    History of  hiatal hernia    History of pneumonia    Hypertension    Insomnia    Osteopenia    Osteoporosis    Personal history of radiation therapy    Thickened endometrium 09/29/2017    PAST SURGICAL HISTORY: Past Surgical History:  Procedure Laterality Date   BREAST LUMPECTOMY WITH RADIOACTIVE SEED AND SENTINEL LYMPH NODE BIOPSY Right 07/31/2016   Procedure: BREAST LUMPECTOMY WITH RADIOACTIVE SEED AND SENTINEL LYMPH NODE BIOPSY, INJECT BLUE DYE RIGHT BREAST;  Surgeon: Fanny Skates, MD;  Location: [facility];  Service: General;  Laterality: Right;   BREAST LUMPECTOMY WITH RADIOACTIVE SEED LOCALIZATION Left 05/07/2021   Procedure: LEFT BREAST LUMPECTOMY WITH RADIOACTIVE SEED LOCALIZATION;  Surgeon: Erroll Luna, MD;  Location: [facility];  Service: General;  Laterality: Left;   COLONOSCOPY     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 07/04/2014   Procedure: LEFT CONVERSION TO TOTAL HIP ARTHROPLASTY;  Surgeon: Gaynelle Arabian, MD;  Location: [facility];  Service: Orthopedics;  Laterality: Left;   ESOPHAGEAL DILATION N/A 09/24/2017   Procedure: ESOPHAGEAL DILATION;  Surgeon: Rogene Houston, MD;  [facility];  Service: Endoscopy;  Laterality: N/A;   ESOPHAGEAL DILATION N/A 01/19/2020   Procedure: ESOPHAGEAL DILATION;  Surgeon: Harvel Quale, MD;  [facility];  Service: Gastroenterology;  Laterality: N/A;   ESOPHAGEAL DILATION  02/28/2020   Procedure: ESOPHAGEAL DILATION;  Surgeon: Rogene Houston, MD;  [facility];  Service: Endoscopy;;   ESOPHAGOGASTRODUODENOSCOPY N/A 01/26/2014   Procedure: ESOPHAGOGASTRODUODENOSCOPY (EGD);  Surgeon: Rogene Houston, MD;  [facility];  Service: Endoscopy;  Laterality: N/A;  1030   ESOPHAGOGASTRODUODENOSCOPY (EGD) WITH PROPOFOL N/A 09/24/2017   Procedure: ESOPHAGOGASTRODUODENOSCOPY (EGD) WITH PROPOFOL;  Surgeon: Rogene Houston, MD;  [facility];  Service: Endoscopy;  Laterality: N/A;  11:15    ESOPHAGOGASTRODUODENOSCOPY (EGD) WITH PROPOFOL N/A 01/19/2020   Procedure: ESOPHAGOGASTRODUODENOSCOPY (EGD) WITH PROPOFOL;  Surgeon: Harvel Quale, MD;  [facility];  Service: Gastroenterology;  Laterality: N/A;  1200   ESOPHAGOGASTRODUODENOSCOPY (EGD) WITH PROPOFOL N/A 02/28/2020   Procedure: ESOPHAGOGASTRODUODENOSCOPY (EGD) WITH PROPOFOL;  Surgeon: Rogene Houston, MD;  [facility];  Service: Endoscopy;  Laterality: N/A;  2:45   HIP FRACTURE SURGERY     05/2009 left -[place]   HYSTEROSCOPY WITH D & C  05/24/2018   Procedure: DILATATION AND CURETTAGE /HYSTEROSCOPY (PROCEDURE # 2)/PAP SMEAR (PROCEDURE #1);  Surgeon: Jonnie Kind, MD;  Location[facility];  Service: Gynecology;;   Venia Minks DILATION N/A 01/26/2014   Procedure: Keturah Shavers;  Surgeon: Rogene Houston, MD;  [facility];  Service: Endoscopy;  Laterality: N/A;    FAMILY HISTORY Family History  Problem Relation Age of Onset   Heart failure Mother    Diabetes Mother    Hypertension Mother    Depression Mother    Other Father        stomach ulcers; abd aneursym   Hypertension Sister    Heart failure Sister    Diabetes Sister    Depression Sister    Diabetes Sister        prediabetes   Cancer Maternal Aunt        breast  The patient's father died at age 44 from rupture of an abdominal aortic aneurysm. The patient's mother died with heart failure  in the setting of diabetes at age 9. The patient had no brothers, 2 sisters. One sister was diagnosed with breast cancer at age 66. Her other sister has had complications from diabetes. One maternal aunt was diagnosed with breast cancer in her 23s   GYNECOLOGIC HISTORY:  No LMP recorded. Patient is postmenopausal. Menarche age 47, first live birth age 67, which the patient is aware increases the risk of breast cancer. She is GX P3. She does not quite recall when she went through the change of life but thinks he was in her 58s. She did  not use hormone replacement.   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 Her husband "Marilyn Axe" Haas, 83, was in Press photographer. He passed away in 10-06-2016, due to cancer and various other medical problems. Son Marilyn Haas lives in [place] where he works in Publishing rights manager. Son Marilyn Haas lives in [place] and works in Forensic scientist. Son Marilyn Haas lives in [place] and works as a Pension scheme manager. The patient has 12 grandchildren and 2 great-grandchildren. She is a Psychologist, forensic.    ADVANCED DIRECTIVES: Not addressed at 06/22/2016 meeting   HEALTH MAINTENANCE: Social History   Tobacco Use   Smoking status: Former    Packs/day: 0.50    Years: 2.00    Total pack years: 1.00    Types: Cigarettes    Quit date: 01/15/1961    Years since quitting: 60.8   Smokeless tobacco: Never  Vaping Use   Vaping Use: Never used  Substance Use Topics   Alcohol use: Yes    Alcohol/week: 15.0 standard drinks of alcohol    Types: 15 Cans of beer per week    Comment: daily   Drug use: No     Colonoscopy: UTD/[facility]  PAP: 05/24/2018, normal  Bone density: 04/22/2016   Allergies  Allergen Reactions   Avelox [Moxifloxacin Hcl In Nacl] Other (See Comments)    Altered mental status    Current Outpatient Medications  Medication Sig Dispense Refill   ALPRAZolam (XANAX) 0.25 MG tablet Take 0.25-0.5 mg by mouth daily.     ARIPiprazole (ABILIFY) 2 MG tablet Take 2 mg by mouth daily.     Cholecalciferol (VITAMIN D3) 125 MCG (5000 UT) CAPS Take 1 capsule by mouth daily.     DULoxetine (CYMBALTA) 60 MG capsule Take 120 mg by mouth daily.     furosemide (LASIX) 20 MG tablet Take 20 mg by mouth daily as needed.     losartan (COZAAR) 25 MG tablet Take 1 tablet (25 mg total) by mouth 2 (two) times daily. 180 tablet 3   omeprazole (PRILOSEC) 20 MG capsule Take 1 capsule (20 mg total) by mouth 2 (two) times daily before a meal. (Patient taking differently: Take 20 mg by mouth daily.) 60 capsule 5   psyllium  (METAMUCIL SMOOTH TEXTURE) 58.6 % powder Take 1 packet by mouth at bedtime.  12   tamoxifen (NOLVADEX) 20 MG tablet TAKE ONE TABLET (20 MG TOTAL) BY MOUTH DAILY. 90 tablet 1   zinc gluconate 50 MG tablet Take 50 mg by mouth daily.     No current facility-administered medications for this visit.    OBJECTIVE: White woman in no acute distress  Vitals:   10/28/21 1506  BP: (!) 162/87  Pulse: 84  Resp: 18  Temp: 97.7 F (36.5 C)  SpO2: 98%      Body mass index is 24.88 kg/m.    ECOG FS:1 - Symptomatic but completely ambulatory  Sclerae  unicteric, EOMs intact Wearing a mask No cervical or supraclavicular adenopathy Lungs no rales or rhonchi Heart regular rate and rhythm Abd soft, nontender, positive bowel sounds MSK no focal spinal tenderness, no upper extremity lymphedema Neuro: nonfocal, well oriented (she was able to tell me that she was going to have a biopsy of the left breast and knew the date of the upcoming test), appropriate affect Breasts: The right breast is status post prior lumpectomy.  There is mild distortion of the contour 5 no suspicious findings.  I do not palpate a mass in the left breast and there are no skin or nipple changes of concern.  Both axillae are benign.  LAB RESULTS:  CMP     Component Value Date/Time   NA 121 (L) 10/28/2021 1423   NA 139 12/21/2016 0931   K 4.7 10/28/2021 1423   K 4.2 12/21/2016 0931   CL 93 (L) 10/28/2021 1423   CO2 24 10/28/2021 1423   CO2 28 12/21/2016 0931   GLUCOSE 103 (H) 10/28/2021 1423   GLUCOSE 86 12/21/2016 0931   BUN 10 10/28/2021 1423   BUN 14.1 12/21/2016 0931   CREATININE 1.03 (H) 10/28/2021 1423   CREATININE 0.9 12/21/2016 0931   CALCIUM 8.7 (L) 10/28/2021 1423   CALCIUM 8.8 12/21/2016 0931   PROT 7.1 10/28/2021 1423   PROT 7.0 12/21/2016 0931   ALBUMIN 4.0 10/28/2021 1423   ALBUMIN 3.7 12/21/2016 0931   AST 14 (L) 10/28/2021 1423   AST 15 12/21/2016 0931   ALT 8 10/28/2021 1423   ALT 8 12/21/2016  0931   ALKPHOS 72 10/28/2021 1423   ALKPHOS 62 12/21/2016 0931   BILITOT 0.4 10/28/2021 1423   BILITOT 0.40 12/21/2016 0931   GFRNONAA 54 (L) 10/28/2021 1423   GFRAA >60 12/05/2018 0242    No results found for: "TOTALPROTELP", "ALBUMINELP", "A1GS", "A2GS", "BETS", "BETA2SER", "GAMS", "MSPIKE", "SPEI"  No results found for: "KPAFRELGTCHN", "LAMBDASER", "KAPLAMBRATIO"  Lab Results  Component Value Date   WBC 6.7 10/28/2021   NEUTROABS 4.6 10/28/2021   HGB 11.4 (L) 10/28/2021   HCT 32.1 (L) 10/28/2021   MCV 89.2 10/28/2021   PLT 262 10/28/2021      Chemistry      Component Value Date/Time   NA 121 (L) 10/28/2021 1423   NA 139 12/21/2016 0931   K 4.7 10/28/2021 1423   K 4.2 12/21/2016 0931   CL 93 (L) 10/28/2021 1423   CO2 24 10/28/2021 1423   CO2 28 12/21/2016 0931   BUN 10 10/28/2021 1423   BUN 14.1 12/21/2016 0931   CREATININE 1.03 (H) 10/28/2021 1423   CREATININE 0.9 12/21/2016 0931      Component Value Date/Time   CALCIUM 8.7 (L) 10/28/2021 1423   CALCIUM 8.8 12/21/2016 0931   ALKPHOS 72 10/28/2021 1423   ALKPHOS 62 12/21/2016 0931   AST 14 (L) 10/28/2021 1423   AST 15 12/21/2016 0931   ALT 8 10/28/2021 1423   ALT 8 12/21/2016 0931   BILITOT 0.4 10/28/2021 1423   BILITOT 0.40 12/21/2016 0931      No results found for: "LABCA2"  No components found for: "JASNKN397"  No results for input(s): "INR" in the last 168 hours.  Urinalysis    Component Value Date/Time   COLORURINE YELLOW 07/04/2021 1901   APPEARANCEUR CLEAR 07/04/2021 1901   LABSPEC 1.009 07/04/2021 1901   PHURINE 6.0 07/04/2021 1901   GLUCOSEU NEGATIVE 07/04/2021 1901   HGBUR MODERATE (A) 07/04/2021 1901   BILIRUBINUR  NEGATIVE 07/04/2021 1901   KETONESUR NEGATIVE 07/04/2021 1901   PROTEINUR NEGATIVE 07/04/2021 1901   UROBILINOGEN 1.0 06/26/2014 1400   NITRITE NEGATIVE 07/04/2021 1901   LEUKOCYTESUR NEGATIVE 07/04/2021 1901    STUDIES: No results found.   ELIGIBLE FOR AVAILABLE  RESEARCH PROTOCOL: no  ASSESSMENT: 81 y.o. [place] woman status post right breast upper outer quadrant biopsy 05/19/2016 for a clinical T1c N0, stage IA invasive lobular breast cancer, estrogen and progesterone receptor positive, HER-2 nonamplified, with an MIB-15%.   (1) Status post right lumpectomy and sentinel lymph node sampling 07/31/2016 for a pT2 pN0, stage IB invasive lobular carcinoma, grade 2, with negative margins  (2) adjuvant radiation  09/01/2016 to 09/29/2016  Site/dose:    1. The Right breast was treated to 42.5 Gy in 17 fractions at 2.5 Gy per fraction. 2. The Right breast was boosted to 7.5 Gy in 3 fractions at 2.5 Gy per fraction.    (3)  started tamoxifen 10/26/2016  (a) bone density 04/22/2016 shows a T score in the right femur of -2.0, right radius -2.4   (b) switched to anastrozole August 2019 because of concerns for vaginal wetness and hot flashes with tamoxifen  (c) switched back to tamoxifen June 2019 as the patient felt there was no improvement in prior issues and she would like to "strengthen her bones".   PLAN:   Marilyn Haas is now 4 and half years out from definitive surgery for her breast cancer with no evidence of disease recurrence.  This is very favorable.  She is tolerating tamoxifen well and the plan is to continue that for a total of 10 years which will take [place] to August 2023.  I am hopeful the left breast biopsy coming up next week will be benign.  If so then she will "graduate" next August as planned.  If it turns out to be positive then she will return to see [place] sometime likely mid January to discuss how to deal with that  She is interested in getting some equipment at home so she could exercise safely perhaps a stationary bike or treadmill.    She knows to call for any other issue that may develop before the next visit.  Total encounter time 20 minutes.*   Magrinat, Virgie Dad, MD  10/28/21 3:16 PM Medical Oncology and Hematology [facility] [place],  [address] Tel. [phone]    Fax. [phone]   I, Wilburn Mylar, am acting as scribe for Dr. Virgie Dad. Magrinat.  I, Lurline Del MD, have reviewed the above documentation for accuracy and completeness, and I agree with the above.   *Total Encounter Time as defined by the Centers for Medicare and Medicaid Services includes, in addition to the face-to-face time of a patient visit (documented in the note above) non-face-to-face time: obtaining and reviewing outside history, ordering and reviewing medications, tests or procedures, care coordination (communications with other health care professionals or caregivers) and documentation in the medical record.

## 2021-10-29 ENCOUNTER — Encounter: Payer: Self-pay | Admitting: Hematology and Oncology

## 2021-10-29 ENCOUNTER — Other Ambulatory Visit: Payer: Self-pay | Admitting: *Deleted

## 2021-11-10 ENCOUNTER — Other Ambulatory Visit: Payer: Medicare Other

## 2021-11-10 DIAGNOSIS — I10 Essential (primary) hypertension: Secondary | ICD-10-CM | POA: Diagnosis not present

## 2021-11-10 DIAGNOSIS — M150 Primary generalized (osteo)arthritis: Secondary | ICD-10-CM | POA: Diagnosis not present

## 2021-11-10 DIAGNOSIS — G894 Chronic pain syndrome: Secondary | ICD-10-CM | POA: Diagnosis not present

## 2021-11-12 ENCOUNTER — Other Ambulatory Visit: Payer: Self-pay | Admitting: Cardiology

## 2021-11-28 ENCOUNTER — Telehealth: Payer: Self-pay | Admitting: *Deleted

## 2021-11-28 NOTE — Patient Outreach (Signed)
  Care Coordination   11/28/2021 Name: ABIE KILLIAN MRN: [id number] DOB: [birth year]   Care Coordination Outreach Attempts:  An unsuccessful telephone outreach was attempted today to offer the patient information about available care coordination services as a benefit of their health plan.   Follow Up Plan:  Additional outreach attempts will be made to offer the patient care coordination information and services.   Encounter Outcome:  No Answer  Care Coordination Interventions Activated:  No   Care Coordination Interventions:  No, not indicated    Valente David, RN, MSN, [facility] Care Management Care Management Coordinator [phone]

## 2021-12-04 ENCOUNTER — Telehealth: Payer: Self-pay | Admitting: *Deleted

## 2021-12-04 NOTE — Patient Outreach (Signed)
  Care Coordination   Initial Visit Note   12/04/2021 Name: Marilyn Haas MRN: [id number] DOB: 1940/11/28  Marilyn Haas is a 81 y.o. year old female who sees Redmond School, MD for primary care. I spoke with  [name] by phone today.  What matters to the patients health and wellness today?  Member state she is hard of hearing, prefer to speak with son who is not currently available.     SDOH assessments and interventions completed:  No     Care Coordination Interventions Activated:  No  Care Coordination Interventions:  No, not indicated   Follow up plan:  will await call from son, if no call will follow up next week    Encounter Outcome:  Pt. Request to Call [place], RN, MSN, [facility] Management Care Management Coordinator [phone]

## 2021-12-07 ENCOUNTER — Other Ambulatory Visit: Payer: Self-pay

## 2021-12-07 ENCOUNTER — Encounter (HOSPITAL_COMMUNITY): Payer: Self-pay

## 2021-12-07 ENCOUNTER — Emergency Department (HOSPITAL_COMMUNITY)
Admission: EM | Admit: 2021-12-07 | Discharge: 2021-12-08 | Disposition: A | Discharge status: Home / Self Care | Payer: Medicare Other | Attending: Emergency Medicine | Admitting: Emergency Medicine

## 2021-12-07 ENCOUNTER — Emergency Department (HOSPITAL_COMMUNITY): Payer: Medicare Other

## 2021-12-07 DIAGNOSIS — I6381 Other cerebral infarction due to occlusion or stenosis of small artery: Secondary | ICD-10-CM | POA: Diagnosis not present

## 2021-12-07 DIAGNOSIS — K449 Diaphragmatic hernia without obstruction or gangrene: Secondary | ICD-10-CM | POA: Diagnosis not present

## 2021-12-07 DIAGNOSIS — I10 Essential (primary) hypertension: Secondary | ICD-10-CM | POA: Diagnosis not present

## 2021-12-07 DIAGNOSIS — Z853 Personal history of malignant neoplasm of breast: Secondary | ICD-10-CM | POA: Insufficient documentation

## 2021-12-07 DIAGNOSIS — R531 Weakness: Secondary | ICD-10-CM | POA: Diagnosis not present

## 2021-12-07 DIAGNOSIS — R0602 Shortness of breath: Secondary | ICD-10-CM | POA: Diagnosis not present

## 2021-12-07 DIAGNOSIS — R42 Dizziness and giddiness: Secondary | ICD-10-CM | POA: Insufficient documentation

## 2021-12-07 DIAGNOSIS — G8929 Other chronic pain: Secondary | ICD-10-CM | POA: Insufficient documentation

## 2021-12-07 DIAGNOSIS — Z20822 Contact with and (suspected) exposure to covid-19: Secondary | ICD-10-CM | POA: Insufficient documentation

## 2021-12-07 DIAGNOSIS — R29818 Other symptoms and signs involving the nervous system: Secondary | ICD-10-CM | POA: Diagnosis not present

## 2021-12-07 LAB — CBC WITH DIFFERENTIAL/PLATELET
Abs Immature Granulocytes: 0.06 10*3/uL (ref 0.00–0.07)
Basophils Absolute: 0.1 10*3/uL (ref 0.0–0.1)
Basophils Relative: 1 %
Eosinophils Absolute: 0.1 10*3/uL (ref 0.0–0.5)
Eosinophils Relative: 2 %
HCT: 37.1 % (ref 36.0–46.0)
Hemoglobin: 12.7 g/dL (ref 12.0–15.0)
Immature Granulocytes: 1 %
Lymphocytes Relative: 15 %
Lymphs Abs: 1.1 10*3/uL (ref 0.7–4.0)
MCH: 31.8 pg (ref 26.0–34.0)
MCHC: 34.2 g/dL (ref 30.0–36.0)
MCV: 92.8 fL (ref 80.0–100.0)
Monocytes Absolute: 0.9 10*3/uL (ref 0.1–1.0)
Monocytes Relative: 13 %
Neutro Abs: 4.9 10*3/uL (ref 1.7–7.7)
Neutrophils Relative %: 68 %
Platelets: 348 10*3/uL (ref 150–400)
RBC: 4 MIL/uL (ref 3.87–5.11)
RDW: 12.7 % (ref 11.5–15.5)
WBC: 7.1 10*3/uL (ref 4.0–10.5)
nRBC: 0 % (ref 0.0–0.2)

## 2021-12-07 LAB — HEPATIC FUNCTION PANEL
ALT: 15 U/L (ref 0–44)
AST: 25 U/L (ref 15–41)
Albumin: 3.6 g/dL (ref 3.5–5.0)
Alkaline Phosphatase: 59 U/L (ref 38–126)
Bilirubin, Direct: 0.2 mg/dL (ref 0.0–0.2)
Indirect Bilirubin: 0.5 mg/dL (ref 0.3–0.9)
Total Bilirubin: 0.7 mg/dL (ref 0.3–1.2)
Total Protein: 6.8 g/dL (ref 6.5–8.1)

## 2021-12-07 LAB — BASIC METABOLIC PANEL
Anion gap: 8 (ref 5–15)
BUN: 13 mg/dL (ref 8–23)
CO2: 21 mmol/L — ABNORMAL LOW (ref 22–32)
Calcium: 8.7 mg/dL — ABNORMAL LOW (ref 8.9–10.3)
Chloride: 93 mmol/L — ABNORMAL LOW (ref 98–111)
Creatinine, Ser: 0.96 mg/dL (ref 0.44–1.00)
GFR, Estimated: 59 mL/min — ABNORMAL LOW (ref >60)
Glucose, Bld: 109 mg/dL — ABNORMAL HIGH (ref 70–99)
Potassium: 4.5 mmol/L (ref 3.5–5.1)
Sodium: 122 mmol/L — ABNORMAL LOW (ref 135–145)

## 2021-12-07 LAB — AMMONIA: Ammonia: 21 umol/L (ref 9–35)

## 2021-12-07 LAB — TROPONIN I (HIGH SENSITIVITY): Troponin I (High Sensitivity): 3 ng/L (ref <18)

## 2021-12-07 LAB — ETHANOL: Alcohol, Ethyl (B): <10 mg/dL (ref <10)

## 2021-12-07 LAB — LIPASE, BLOOD: Lipase: 36 U/L (ref 11–51)

## 2021-12-07 MED ORDER — SODIUM CHLORIDE 0.9 % IV BOLUS
500.0000 mL | Freq: Once | INTRAVENOUS | Status: AC
  Administered 2021-12-07: 500 mL via INTRAVENOUS

## 2021-12-07 NOTE — ED Provider Notes (Signed)
Emergency Department Provider Note   I have reviewed the triage vital signs and the nursing notes.   HISTORY  Chief Complaint Weakness   HPI [name] is a 81 y.o. female with past history reviewed below including hypertension, anxiety, alcohol use presents to the emergency department with lightheadedness when standing/walking.  She is here with her daughter-in-law and is looked after by her son who visits regularly but patient lives alone.  Family became concerned that she has had worsening generalized weakness, poor appetite, not performing her ADLs adequately.  They note that trash is building up at home and she is not bathing unless given specific help.  The family states she is not taking her medications regularly but does continue to take Xanax. She had an admission in April of this year after a fall and multiple medications thought to be contributing to this.  She went home initially with some home health but that has ended.  Patient does drink alcohol regularly.  She estimates 8-10 beers daily but stopped 1 week prior.  She states that she "just didn't feel like drinking" and so quit.  She has not had any tremors, confusion, hallucinations, or seizure activity.  No prior history of complicated alcohol withdrawal. No known prior CVA history.    Past Medical History:  Diagnosis Date   Anemia    Anxiety    Arthritis    Breast cancer ([place]) 05/19/2016   Right breast   Chronic back pain    Scoliosis, stenosis   Depression    Diverticulosis    GERD (gastroesophageal reflux disease)    Hard of hearing    History of blood transfusion    History of colon polyps    History of hiatal hernia    History of pneumonia    Hypertension    Insomnia    Osteopenia    Osteoporosis    Personal history of radiation therapy    Thickened endometrium 09/29/2017    Review of Systems  Constitutional: No fever/chills. Positive weakness and lightheadedness with standing.  Eyes: No  visual changes. Cardiovascular: Denies chest pain. Respiratory: Denies shortness of breath. Gastrointestinal: No abdominal pain.  No nausea, no vomiting.  No diarrhea.  No constipation. Genitourinary: Negative for dysuria. Musculoskeletal: Negative for back pain. Skin: Negative for rash. Neurological: Negative for headaches, focal weakness or numbness.   ____________________________________________   PHYSICAL EXAM:  VITAL SIGNS: ED Triage Vitals [12/07/21 1925]  Enc Vitals Group     BP 135/85     Pulse Rate 94     Resp 16     Temp 98.3 F (36.8 C)     Temp Source Oral     SpO2 97 %     Weight 147 lb 11.3 oz (67 kg)     Height '5\' 5"'$  (1.651 m)   Constitutional: Alert and oriented. Well appearing and in no acute distress. Eyes: Conjunctivae are normal.  Head: Atraumatic. Nose: No congestion/rhinnorhea. Mouth/Throat: Mucous membranes are moist.   Neck: No stridor. No cervical spine tenderness to palpation. Cardiovascular: Normal rate, regular rhythm. Good peripheral circulation. Grossly normal heart sounds.   Respiratory: Normal respiratory effort.  No retractions. Lungs CTAB. Gastrointestinal: Soft and nontender. No distention.  Musculoskeletal: No lower extremity tenderness nor edema. No gross deformities of extremities. Neurologic:  Normal speech and language.  5/5 strength in the bilateral upper and lower extremities with normal sensation.  Patient is able to stand at bedside with some assistance and take several steps  but reports feeling lightheaded.  Negative Romberg.  Skin:  Skin is warm, dry and intact. No rash noted.  ____________________________________________   LABS (all labs ordered are listed, but only abnormal results are displayed)  Labs Reviewed  BASIC METABOLIC PANEL - Abnormal; Notable for the following components:      Result Value   Sodium 122 (*)    Chloride 93 (*)    CO2 21 (*)    Glucose, Bld 109 (*)    Calcium 8.7 (*)    GFR, Estimated 59 (*)     All other components within normal limits  URINALYSIS, ROUTINE W REFLEX MICROSCOPIC - Abnormal; Notable for the following components:   Hgb urine dipstick MODERATE (*)    Bacteria, UA RARE (*)    All other components within normal limits  RESP PANEL BY RT-PCR (FLU A&B, COVID) ARPGX2  URINE CULTURE  CBC WITH DIFFERENTIAL/PLATELET  AMMONIA  ETHANOL  HEPATIC FUNCTION PANEL  LIPASE, BLOOD  TROPONIN I (HIGH SENSITIVITY)  TROPONIN I (HIGH SENSITIVITY)   ____________________________________________  EKG   EKG Interpretation  Date/Time:  Sunday December 07 2021 22:37:26 EDT Ventricular Rate:  82 PR Interval:  187 QRS Duration: 98 QT Interval:  386 QTC Calculation: 451 R Axis:   2 Text Interpretation: Sinus rhythm Confirmed by Gerlene Fee [phone]) on 12/07/2021 11:22:00 PM        ____________________________________________   PROCEDURES  Procedure(s) performed:   Procedures  None  ____________________________________________   INITIAL IMPRESSION / ASSESSMENT AND PLAN / ED COURSE  Pertinent labs & imaging results that were available during my care of the patient were reviewed by me and considered in my medical decision making (see chart for details).   This patient is Presenting for Evaluation of AMS, which does require a range of treatment options, and is a complaint that involves a high risk of morbidity and mortality.  The Differential Diagnoses includes but is not exclusive to alcohol, illicit or prescription medications, intracranial pathology such as stroke, intracerebral hemorrhage, fever or infectious causes including sepsis, hypoxemia, uremia, trauma, endocrine related disorders such as diabetes, hypoglycemia, thyroid-related diseases, etc.   Critical Interventions-    Medications  ARIPiprazole (ABILIFY) tablet 2 mg (2 mg Oral Given 12/08/21 1512)  furosemide (LASIX) tablet 20 mg (has no administration in time range)  losartan (COZAAR) tablet 25 mg (25  mg Oral Given 12/08/21 1409)  tamoxifen (NOLVADEX) tablet 10 mg (10 mg Oral Given 12/08/21 1512)  zinc sulfate capsule 220 mg (220 mg Oral Given 12/08/21 1409)  DULoxetine (CYMBALTA) DR capsule 120 mg (120 mg Oral Given 12/08/21 1409)  ALPRAZolam (XANAX) tablet 0.25-0.5 mg (0.5 mg Oral Given 12/08/21 1409)  sodium chloride 0.9 % bolus 500 mL (0 mLs Intravenous Stopped 12/08/21 0010)    Reassessment after intervention: No change.    I did obtain Additional Historical Information from daughter in law at bedside.   I decided to review pertinent External Data, and in summary admit from April 2023 reviewed. D/c with home health at that time.    Clinical Laboratory Tests Ordered, included patient with hyponatremia 122, likely related to EtOH, and similar with prior values.  Ammonia normal.  EtOH negative.  No leukocytosis or anemia.  Radiologic Tests Ordered, included CT head and CXR. I independently interpreted the images and agree with radiology interpretation.   Cardiac Monitor Tracing which shows NSR.    Social Determinants of Health Risk patient is a daily, heavy drinker. Stopped 1 week prior.   Medical Decision  Making: Summary:  Patient presents to the emergency department with some gait instability and weakness.  Some of this seems more subacute but family has noticed a distinct deterioration over the past 2 weeks especially.  I do not appreciate significant evidence of alcohol withdrawal.  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 CXR negative.   Reevaluation with update and discussion with patient and family.  Plan for UA and additional blood work to result.  Dr. Sedonia Small to follow.  If negative, would consider MRI in the morning with case mgmt consultation PRN.   Disposition: pending   ____________________________________________  FINAL CLINICAL IMPRESSION(S) / ED DIAGNOSES  Final diagnoses:  Generalized weakness    Note:  This document was prepared using Dragon voice  recognition software and may include unintentional dictation errors.  Nanda Quinton, MD, [facility] Emergency Medicine    Deshae Dickison, Wonda Olds, MD 12/08/21 [phone]

## 2021-12-07 NOTE — ED Triage Notes (Signed)
Pt arrived from home via POV, c/o increasing weakness, dizziness. Pt brought in by family friend who reports Pt has been unable to care for herself at home, Pt lives by herself. Pt unable to manage her medications, Pt unabel to complete ADLs independently, Pt reports being scared to be by herself, Pt unable to feed herself, and Pt uses a walker at home to ambulate. Pt also reports incontinence.

## 2021-12-07 NOTE — ED Provider Notes (Signed)
  Provider Note MRN:  [id number]  Arrival date & time: 12/07/21    ED Course and Medical Decision Making  Assumed care from Dr. Laverta Baltimore at shift change.  Family concerned the patient is not caring for herself at home.  Patient having some mild lightheadedness with standing but otherwise not a lot of complaints.  Drinks 8-10 beers per day.  Chronic hyponatremia noted in the labs.  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  Awaiting urinalysis, will consider MRI in the morning.  Procedures  Final Clinical Impressions(s) / ED Diagnoses  No diagnosis found.  ED Discharge Orders     None       Discharge Instructions   None     Barth Kirks. Sedonia Small, [place] [email or web address]    Maudie Flakes, MD 12/08/21 [phone]

## 2021-12-08 ENCOUNTER — Emergency Department (HOSPITAL_COMMUNITY): Payer: Medicare Other

## 2021-12-08 DIAGNOSIS — R531 Weakness: Secondary | ICD-10-CM | POA: Diagnosis not present

## 2021-12-08 DIAGNOSIS — R29818 Other symptoms and signs involving the nervous system: Secondary | ICD-10-CM | POA: Diagnosis not present

## 2021-12-08 LAB — URINALYSIS, ROUTINE W REFLEX MICROSCOPIC
Bilirubin Urine: NEGATIVE
Glucose, UA: NEGATIVE mg/dL
Ketones, ur: NEGATIVE mg/dL
Leukocytes,Ua: NEGATIVE
Nitrite: NEGATIVE
Protein, ur: NEGATIVE mg/dL
Specific Gravity, Urine: 1.008 (ref 1.005–1.030)
pH: 6 (ref 5.0–8.0)

## 2021-12-08 LAB — RESP PANEL BY RT-PCR (FLU A&B, COVID) ARPGX2
Influenza A by PCR: NEGATIVE
Influenza B by PCR: NEGATIVE
SARS Coronavirus 2 by RT PCR: NEGATIVE

## 2021-12-08 LAB — TROPONIN I (HIGH SENSITIVITY): Troponin I (High Sensitivity): 3 ng/L (ref <18)

## 2021-12-08 MED ORDER — ALPRAZOLAM 0.5 MG PO TABS
0.2500 mg | ORAL_TABLET | Freq: Every day | ORAL | Status: DC
  Administered 2021-12-08: 0.5 mg via ORAL
  Filled 2021-12-08: qty 1

## 2021-12-08 MED ORDER — ARIPIPRAZOLE 2 MG PO TABS
2.0000 mg | ORAL_TABLET | Freq: Every day | ORAL | Status: DC
  Administered 2021-12-08: 2 mg via ORAL
  Filled 2021-12-08 (×3): qty 1

## 2021-12-08 MED ORDER — LOSARTAN POTASSIUM 25 MG PO TABS
25.0000 mg | ORAL_TABLET | Freq: Two times a day (BID) | ORAL | Status: DC
  Administered 2021-12-08: 25 mg via ORAL
  Filled 2021-12-08: qty 1

## 2021-12-08 MED ORDER — TAMOXIFEN CITRATE 10 MG PO TABS
10.0000 mg | ORAL_TABLET | ORAL | Status: DC
  Administered 2021-12-08: 10 mg via ORAL
  Filled 2021-12-08 (×2): qty 1

## 2021-12-08 MED ORDER — FUROSEMIDE 40 MG PO TABS: 20.0000 mg | ORAL_TABLET | Freq: Every day | ORAL | Status: DC | PRN

## 2021-12-08 MED ORDER — ZINC SULFATE 220 (50 ZN) MG PO CAPS
220.0000 mg | ORAL_CAPSULE | Freq: Every day | ORAL | Status: DC
  Administered 2021-12-08: 220 mg via ORAL
  Filled 2021-12-08: qty 1

## 2021-12-08 MED ORDER — DULOXETINE HCL 30 MG PO CPEP
120.0000 mg | ORAL_CAPSULE | Freq: Every day | ORAL | Status: DC
  Administered 2021-12-08: 120 mg via ORAL
  Filled 2021-12-08: qty 4

## 2021-12-08 NOTE — ED Notes (Signed)
This RN assumed care for this pt, introduced myself & assessed needs, no needs expressed @ this time, pure wick remains in place, call light w/in reach.

## 2021-12-08 NOTE — ED Notes (Signed)
Pt in bed, pt oriented, son Marilyn Haas with pt, states that they are ready to go and have a plan in place with social worker to have help tomorrow am with placement.  Pt from dpt with family.

## 2021-12-08 NOTE — Social Work (Signed)
CSW spoke with son, the son is aware that his mom is in the hospital. The son has wanted home health PT. The mom is being discharged home with home health PT.

## 2021-12-08 NOTE — Social Work (Signed)
CSW spoke to the family, making sure that the family had all resources in the community to help the Pt. CSW made a referral to [facility] as for home health PT as well a Education officer, museum. The CSW also is making a referral to a place for mom. CSW gave the family the information on how to apply for medicaid as well the medicare .gov site for facilities in the community. The Pt will be discharged as the Pt was cleared and PT has recommended home health PT for this Pt.

## 2021-12-08 NOTE — Evaluation (Signed)
Physical Therapy Evaluation Patient Details Name: Marilyn Haas MRN: [id number] DOB: Nov 07, 1940 Today's Date: 12/08/2021  History of Present Illness  Family concerned the patient is not caring for herself at home.  Patient having some mild lightheadedness with standing but otherwise not a lot of complaints.  Drinks 8-10 beers per day.  Chronic hyponatremia noted in the labs.  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  Awaiting urinalysis, will consider MRI in the morning.   Clinical Impression  Patient demonstrates slightly labored movement for supine to sitting and transferring to chair without loss of balance using RW, able to ambulate in hallway without loss of balance with good return for using RW.  Patient tolerated sitting up in chair after therapy - nursing staff aware.  Patient will benefit from continued skilled physical therapy in hospital and recommended venue below to increase strength, balance, endurance for safe ADLs and gait.        Recommendations for follow up therapy are one component of a multi-disciplinary discharge planning process, led by the attending physician.  Recommendations may be updated based on patient status, additional functional criteria and insurance authorization.  Follow Up Recommendations Home health PT      Assistance Recommended at Discharge Set up Supervision/Assistance  Patient can return home with the following  A little help with walking and/or transfers;A little help with bathing/dressing/bathroom;Assistance with cooking/housework;Help with stairs or ramp for entrance;Assist for transportation    Equipment Recommendations None recommended by PT  Recommendations for Other Services       Functional Status Assessment Patient has had a recent decline in their functional status and demonstrates the ability to make significant improvements in function in a reasonable and predictable amount of time.     Precautions / Restrictions  Precautions Precautions: Fall Restrictions Weight Bearing Restrictions: No      Mobility  Bed Mobility Overal bed mobility: Needs Assistance Bed Mobility: Supine to Sit     Supine to sit: Min guard, Min assist     General bed mobility comments: increased time, slightly labored movement    Transfers Overall transfer level: Needs assistance Equipment used: Rolling walker (2 wheels) Transfers: Sit to/from Stand, Bed to chair/wheelchair/BSC Sit to Stand: Min guard   Step pivot transfers: Min guard       General transfer comment: increased time, labored movement    Ambulation/Gait Ambulation/Gait assistance: Min guard, Supervision Gait Distance (Feet): 65 Feet Assistive device: Rolling walker (2 wheels) Gait Pattern/deviations: Decreased step length - right, Decreased step length - left, Decreased stride length Gait velocity: decreased     General Gait Details: slow slightly labored cadence without loss of balance, limited secondary to fatigue  Stairs            Wheelchair Mobility    Modified Rankin (Stroke Patients Only)       Balance Overall balance assessment: Needs assistance Sitting-balance support: Feet unsupported, No upper extremity supported Sitting balance-Leahy Scale: Fair Sitting balance - Comments: fair/good seated at EOB   Standing balance support: During functional activity, Bilateral upper extremity supported Standing balance-Leahy Scale: Fair Standing balance comment: fair/good using RW                             Pertinent Vitals/Pain Pain Assessment Pain Assessment: No/denies pain    Home Living Family/patient expects to be discharged to:: Private residence Living Arrangements: Alone Available Help at Discharge: Family Type of Home: House Home Access: Stairs  to enter Entrance Stairs-Rails: None Entrance Stairs-Number of Steps: 2 Alternate Level Stairs-Number of Steps: Patient states she does not go  upstairs Home Layout: Two level;Able to live on main level with bedroom/bathroom;Full bath on main level Home Equipment: Rolling Walker (2 wheels);Rollator (4 wheels);BSC/3in1;Shower seat      Prior Function Prior Level of Function : Needs assist       Physical Assist : Mobility (physical) Mobility (physical): Bed mobility;Transfers;Gait;Stairs   Mobility Comments: Patient assisted for going up steps to get into house, household ambulator using Rollator ADLs Comments: Assisted by family     Hand Dominance   Dominant Hand: Right    Extremity/Trunk Assessment   Upper Extremity Assessment Upper Extremity Assessment: Generalized weakness;Difficult to assess due to impaired cognition    Lower Extremity Assessment Lower Extremity Assessment: Generalized weakness    Cervical / Trunk Assessment Cervical / Trunk Assessment: Normal  Communication   Communication: No difficulties  Cognition Arousal/Alertness: Awake/alert Behavior During Therapy: WFL for tasks assessed/performed Overall Cognitive Status: Within Functional Limits for tasks assessed                                          General Comments      Exercises     Assessment/Plan    PT Assessment Patient needs continued PT services  PT Problem List Decreased strength;Decreased activity tolerance;Decreased balance;Decreased mobility       PT Treatment Interventions DME instruction;Gait training;Stair training;Functional mobility training;Therapeutic activities;Therapeutic exercise;Balance training;Patient/family education    PT Goals (Current goals can be found in the Care Plan section)  Acute Rehab PT Goals Patient Stated Goal: return home with family to assist PT Goal Formulation: With patient Time For Goal Achievement: 12/13/21 Potential to Achieve Goals: Good    Frequency Min 2X/week     Co-evaluation               AM-PAC PT "6 Clicks" Mobility  Outcome Measure Help needed  turning from your back to your side while in a flat bed without using bedrails?: A Little Help needed moving from lying on your back to sitting on the side of a flat bed without using bedrails?: A Little Help needed moving to and from a bed to a chair (including a wheelchair)?: A Little Help needed standing up from a chair using your arms (e.g., wheelchair or bedside chair)?: A Little Help needed to walk in hospital room?: A Little Help needed climbing 3-5 steps with a railing? : A Lot 6 Click Score: 17    End of Session   Activity Tolerance: Patient tolerated treatment well;Patient limited by fatigue Patient left: in chair;with call bell/phone within reach Nurse Communication: Mobility status PT Visit Diagnosis: Unsteadiness on feet (R26.81);Other abnormalities of gait and mobility (R26.89);Muscle weakness (generalized) (M62.81)    Time: 8657-8469 PT Time Calculation (min) (ACUTE ONLY): 30 min   Charges:   PT Evaluation $PT Eval Moderate Complexity: 1 Mod PT Treatments $Therapeutic Activity: 23-37 mins        3:39 PM, 12/08/21 Lonell Grandchild, MPT Physical Therapist with [facility] 336 [phone] office [phone] mobile phone

## 2021-12-08 NOTE — ED Provider Notes (Signed)
A different son then the one SW spoke with came to pick up the patient. They discussed their discomfort with the plan for discharge. Read PT note.  PT indicated discharge with home therapy to get strength.  The family was wondering if patient can be placed.  I reconsulted social work.  Social work has reseen the patient.   I have reviewed the labs, they are all reassuring. Social work discussed with the family that there is nothing they can do in terms of placement from the ED.  Home health has been set up, they will see the patient tomorrow.  Additional social work resources also provided.  They will also help patient get Medicaid if she qualifies.  They recommended that family can take her home with the are uncomfortable.  Stable for discharge at this point.   Varney Biles, MD 12/08/21 1719

## 2021-12-08 NOTE — Progress Notes (Signed)
Awaiting PT eval.  

## 2021-12-08 NOTE — ED Notes (Signed)
Pt assisted to sitting up in bed and breakfast provided by family. Bedside table placed in front of patient with tray.

## 2021-12-08 NOTE — ED Provider Notes (Addendum)
  Physical Exam  BP 121/78   Pulse 95   Temp 98 F (36.7 C) (Oral)   Resp 14   Ht '5\' 5"'$  (1.651 m)   Wt 67 kg   SpO2 99%   BMI 24.58 kg/m   Physical Exam Vitals and nursing note reviewed.  Constitutional:      General: She is not in acute distress.    Appearance: She is well-developed.  HENT:     Head: Normocephalic and atraumatic.  Eyes:     Conjunctiva/sclera: Conjunctivae normal.  Cardiovascular:     Rate and Rhythm: Normal rate and regular rhythm.     Heart sounds: No murmur heard. Pulmonary:     Effort: Pulmonary effort is normal. No respiratory distress.     Breath sounds: Normal breath sounds.  Abdominal:     Palpations: Abdomen is soft.     Tenderness: There is no abdominal tenderness.  Musculoskeletal:        General: No swelling.     Cervical back: Neck supple.  Skin:    General: Skin is warm and dry.     Capillary Refill: Capillary refill takes less than 2 seconds.  Neurological:     Mental Status: She is alert.  Psychiatric:        Mood and Affect: Mood normal.     Procedures  Procedures  ED Course / MDM   Clinical Course as of 12/08/21 1130  Sun Dec 07, 2021  2247 CT Head Wo Contrast [JL]    Clinical Course User Index [JL] Long, Wonda Olds, MD   Medical Decision Making Amount and/or Complexity of Data Reviewed Labs: ordered. Radiology: ordered. Decision-making details documented in ED Course.  Risk OTC drugs. Prescription drug management.   Patient received in handoff.  Generalized weakness with possible stroke on CT but pending MRI at time of signout.  MRI is reassuringly negative for acute stroke.  Family quite concerned about patient's functional status and physical therapy and TOC consult were placed.  Physical therapy did evaluate the patient and she is safe for home health physical therapy.  TOC spoke with the son who would like to pursue additional home health options.  Patient then discharged in the care of the son       Teressa Lower, MD 12/08/21 [place]    Shanna Un, Suarez, MD 12/08/21 1558

## 2021-12-08 NOTE — ED Notes (Signed)
Pt cleaned and in recliner at this time.

## 2021-12-08 NOTE — Plan of Care (Signed)
  Problem: Acute Rehab PT Goals(only PT should resolve) Goal: Pt Will Go Supine/Side To Sit Outcome: Progressing Flowsheets (Taken 12/08/2021 1541) Pt will go Supine/Side to Sit:  with supervision  with min guard assist Goal: Patient Will Transfer Sit To/From Stand Outcome: Progressing Flowsheets (Taken 12/08/2021 1541) Patient will transfer sit to/from stand:  with supervision  with modified independence Goal: Pt Will Transfer Bed To Chair/Chair To Bed Outcome: Progressing Flowsheets (Taken 12/08/2021 1541) Pt will Transfer Bed to Chair/Chair to Bed:  with modified independence  with supervision Goal: Pt Will Ambulate Outcome: Progressing Flowsheets (Taken 12/08/2021 1541) Pt will Ambulate:  > 125 feet  with supervision  with modified independence  with rolling walker   3:41 PM, 12/08/21 Lonell Grandchild, MPT Physical Therapist with [facility] 336 [phone] office [phone] mobile phone

## 2021-12-08 NOTE — ED Notes (Signed)
Son and daughter in law at bedside, they state that they live in [place], [place] and [place]) state that they can't bring her home, state that they are headed back to [place] tonight, states that the other son must be who msw talked to and they don't feel pt is safe to be home alone.  MD notified, called msw and updated her.  MSW talking with pt's family.

## 2021-12-09 LAB — URINE CULTURE: Culture: NO GROWTH

## 2021-12-12 ENCOUNTER — Telehealth: Payer: Self-pay | Admitting: *Deleted

## 2021-12-12 DIAGNOSIS — G894 Chronic pain syndrome: Secondary | ICD-10-CM | POA: Diagnosis not present

## 2021-12-12 DIAGNOSIS — F419 Anxiety disorder, unspecified: Secondary | ICD-10-CM | POA: Diagnosis not present

## 2021-12-12 DIAGNOSIS — M150 Primary generalized (osteo)arthritis: Secondary | ICD-10-CM | POA: Diagnosis not present

## 2021-12-12 DIAGNOSIS — I10 Essential (primary) hypertension: Secondary | ICD-10-CM | POA: Diagnosis not present

## 2021-12-12 DIAGNOSIS — Z6824 Body mass index (BMI) 24.0-24.9, adult: Secondary | ICD-10-CM | POA: Diagnosis not present

## 2021-12-12 DIAGNOSIS — F5101 Primary insomnia: Secondary | ICD-10-CM | POA: Diagnosis not present

## 2021-12-12 DIAGNOSIS — T8484XA Pain due to internal orthopedic prosthetic devices, implants and grafts, initial encounter: Secondary | ICD-10-CM | POA: Diagnosis not present

## 2021-12-12 NOTE — Patient Outreach (Signed)
  Care Coordination   12/12/2021 Name: Marilyn Haas MRN: [id number] DOB: 1941-02-26   Care Coordination Outreach Attempts:  A third unsuccessful outreach was attempted today to offer the patient with information about available care coordination services as a benefit of their health plan.   Follow Up Plan:  No further outreach attempts will be made at this time. We have been unable to contact the patient to offer or enroll patient in care coordination services  Encounter Outcome:  No Answer  Care Coordination Interventions Activated:  No   Care Coordination Interventions:  No, not indicated    Valente David, RN, MSN, [facility] Care Management Care Management Coordinator [phone]

## 2022-01-09 DIAGNOSIS — I10 Essential (primary) hypertension: Secondary | ICD-10-CM | POA: Diagnosis not present

## 2022-01-09 DIAGNOSIS — T8484XA Pain due to internal orthopedic prosthetic devices, implants and grafts, initial encounter: Secondary | ICD-10-CM | POA: Diagnosis not present

## 2022-01-09 DIAGNOSIS — M150 Primary generalized (osteo)arthritis: Secondary | ICD-10-CM | POA: Diagnosis not present

## 2022-01-09 DIAGNOSIS — G894 Chronic pain syndrome: Secondary | ICD-10-CM | POA: Diagnosis not present

## 2022-01-13 DIAGNOSIS — Z23 Encounter for immunization: Secondary | ICD-10-CM | POA: Diagnosis not present

## 2022-02-01 ENCOUNTER — Encounter (INDEPENDENT_AMBULATORY_CARE_PROVIDER_SITE_OTHER): Payer: Self-pay | Admitting: Gastroenterology

## 2022-02-06 ENCOUNTER — Inpatient Hospital Stay (HOSPITAL_COMMUNITY): Payer: Medicare Other

## 2022-02-06 ENCOUNTER — Other Ambulatory Visit: Payer: Self-pay

## 2022-02-06 ENCOUNTER — Encounter (HOSPITAL_COMMUNITY): Payer: Self-pay | Admitting: Family Medicine

## 2022-02-06 ENCOUNTER — Emergency Department (HOSPITAL_COMMUNITY): Payer: Medicare Other

## 2022-02-06 ENCOUNTER — Inpatient Hospital Stay (HOSPITAL_COMMUNITY)
Admission: EM | Admit: 2022-02-06 | Discharge: 2022-02-11 | DRG: 378 | Disposition: A | Discharge status: Skilled Nursing Facility | Payer: Medicare Other | Attending: Internal Medicine | Admitting: Internal Medicine

## 2022-02-06 DIAGNOSIS — Z96642 Presence of left artificial hip joint: Secondary | ICD-10-CM | POA: Diagnosis present

## 2022-02-06 DIAGNOSIS — E869 Volume depletion, unspecified: Secondary | ICD-10-CM | POA: Diagnosis present

## 2022-02-06 DIAGNOSIS — M6281 Muscle weakness (generalized): Secondary | ICD-10-CM | POA: Diagnosis not present

## 2022-02-06 DIAGNOSIS — M419 Scoliosis, unspecified: Secondary | ICD-10-CM | POA: Diagnosis present

## 2022-02-06 DIAGNOSIS — Z1152 Encounter for screening for COVID-19: Secondary | ICD-10-CM

## 2022-02-06 DIAGNOSIS — F419 Anxiety disorder, unspecified: Secondary | ICD-10-CM | POA: Diagnosis present

## 2022-02-06 DIAGNOSIS — Z888 Allergy status to other drugs, medicaments and biological substances status: Secondary | ICD-10-CM

## 2022-02-06 DIAGNOSIS — Z28310 Unvaccinated for covid-19: Secondary | ICD-10-CM | POA: Diagnosis not present

## 2022-02-06 DIAGNOSIS — M1990 Unspecified osteoarthritis, unspecified site: Secondary | ICD-10-CM | POA: Diagnosis present

## 2022-02-06 DIAGNOSIS — G4700 Insomnia, unspecified: Secondary | ICD-10-CM | POA: Diagnosis present

## 2022-02-06 DIAGNOSIS — D500 Iron deficiency anemia secondary to blood loss (chronic): Secondary | ICD-10-CM | POA: Diagnosis present

## 2022-02-06 DIAGNOSIS — Z741 Need for assistance with personal care: Secondary | ICD-10-CM | POA: Diagnosis not present

## 2022-02-06 DIAGNOSIS — I10 Essential (primary) hypertension: Secondary | ICD-10-CM | POA: Diagnosis not present

## 2022-02-06 DIAGNOSIS — S0033XA Contusion of nose, initial encounter: Secondary | ICD-10-CM | POA: Diagnosis present

## 2022-02-06 DIAGNOSIS — R944 Abnormal results of kidney function studies: Secondary | ICD-10-CM | POA: Diagnosis present

## 2022-02-06 DIAGNOSIS — K222 Esophageal obstruction: Secondary | ICD-10-CM | POA: Diagnosis not present

## 2022-02-06 DIAGNOSIS — Y92009 Unspecified place in unspecified non-institutional (private) residence as the place of occurrence of the external cause: Secondary | ICD-10-CM

## 2022-02-06 DIAGNOSIS — R531 Weakness: Secondary | ICD-10-CM | POA: Diagnosis not present

## 2022-02-06 DIAGNOSIS — K219 Gastro-esophageal reflux disease without esophagitis: Secondary | ICD-10-CM | POA: Diagnosis present

## 2022-02-06 DIAGNOSIS — D519 Vitamin B12 deficiency anemia, unspecified: Secondary | ICD-10-CM | POA: Diagnosis present

## 2022-02-06 DIAGNOSIS — E871 Hypo-osmolality and hyponatremia: Secondary | ICD-10-CM | POA: Diagnosis not present

## 2022-02-06 DIAGNOSIS — Z79899 Other long term (current) drug therapy: Secondary | ICD-10-CM

## 2022-02-06 DIAGNOSIS — Z043 Encounter for examination and observation following other accident: Secondary | ICD-10-CM | POA: Diagnosis not present

## 2022-02-06 DIAGNOSIS — R112 Nausea with vomiting, unspecified: Secondary | ICD-10-CM | POA: Diagnosis present

## 2022-02-06 DIAGNOSIS — M810 Age-related osteoporosis without current pathological fracture: Secondary | ICD-10-CM | POA: Diagnosis present

## 2022-02-06 DIAGNOSIS — Z87891 Personal history of nicotine dependence: Secondary | ICD-10-CM | POA: Diagnosis not present

## 2022-02-06 DIAGNOSIS — W19XXXA Unspecified fall, initial encounter: Secondary | ICD-10-CM | POA: Diagnosis not present

## 2022-02-06 DIAGNOSIS — Y92019 Unspecified place in single-family (private) house as the place of occurrence of the external cause: Secondary | ICD-10-CM

## 2022-02-06 DIAGNOSIS — Z9181 History of falling: Secondary | ICD-10-CM | POA: Diagnosis not present

## 2022-02-06 DIAGNOSIS — F5101 Primary insomnia: Secondary | ICD-10-CM | POA: Diagnosis not present

## 2022-02-06 DIAGNOSIS — M5459 Other low back pain: Secondary | ICD-10-CM | POA: Diagnosis not present

## 2022-02-06 DIAGNOSIS — G894 Chronic pain syndrome: Secondary | ICD-10-CM | POA: Diagnosis not present

## 2022-02-06 DIAGNOSIS — F32A Depression, unspecified: Secondary | ICD-10-CM | POA: Diagnosis present

## 2022-02-06 DIAGNOSIS — R799 Abnormal finding of blood chemistry, unspecified: Secondary | ICD-10-CM | POA: Diagnosis not present

## 2022-02-06 DIAGNOSIS — K922 Gastrointestinal hemorrhage, unspecified: Secondary | ICD-10-CM | POA: Diagnosis present

## 2022-02-06 DIAGNOSIS — Z853 Personal history of malignant neoplasm of breast: Secondary | ICD-10-CM

## 2022-02-06 DIAGNOSIS — H9190 Unspecified hearing loss, unspecified ear: Secondary | ICD-10-CM | POA: Diagnosis present

## 2022-02-06 DIAGNOSIS — F1020 Alcohol dependence, uncomplicated: Secondary | ICD-10-CM | POA: Diagnosis present

## 2022-02-06 DIAGNOSIS — R000 Tachycardia, unspecified: Secondary | ICD-10-CM | POA: Diagnosis not present

## 2022-02-06 DIAGNOSIS — R55 Syncope and collapse: Secondary | ICD-10-CM | POA: Diagnosis not present

## 2022-02-06 DIAGNOSIS — M549 Dorsalgia, unspecified: Secondary | ICD-10-CM | POA: Diagnosis present

## 2022-02-06 DIAGNOSIS — M159 Polyosteoarthritis, unspecified: Secondary | ICD-10-CM | POA: Diagnosis not present

## 2022-02-06 DIAGNOSIS — K449 Diaphragmatic hernia without obstruction or gangrene: Secondary | ICD-10-CM | POA: Diagnosis not present

## 2022-02-06 DIAGNOSIS — K254 Chronic or unspecified gastric ulcer with hemorrhage: Principal | ICD-10-CM | POA: Diagnosis present

## 2022-02-06 DIAGNOSIS — K3189 Other diseases of stomach and duodenum: Secondary | ICD-10-CM | POA: Diagnosis not present

## 2022-02-06 DIAGNOSIS — G8929 Other chronic pain: Secondary | ICD-10-CM | POA: Diagnosis present

## 2022-02-06 DIAGNOSIS — Z8249 Family history of ischemic heart disease and other diseases of the circulatory system: Secondary | ICD-10-CM

## 2022-02-06 DIAGNOSIS — R1312 Dysphagia, oropharyngeal phase: Secondary | ICD-10-CM | POA: Diagnosis not present

## 2022-02-06 DIAGNOSIS — Z803 Family history of malignant neoplasm of breast: Secondary | ICD-10-CM

## 2022-02-06 DIAGNOSIS — K921 Melena: Principal | ICD-10-CM

## 2022-02-06 DIAGNOSIS — Z8719 Personal history of other diseases of the digestive system: Secondary | ICD-10-CM

## 2022-02-06 DIAGNOSIS — Z833 Family history of diabetes mellitus: Secondary | ICD-10-CM

## 2022-02-06 DIAGNOSIS — Z818 Family history of other mental and behavioral disorders: Secondary | ICD-10-CM

## 2022-02-06 DIAGNOSIS — Z8701 Personal history of pneumonia (recurrent): Secondary | ICD-10-CM

## 2022-02-06 DIAGNOSIS — I517 Cardiomegaly: Secondary | ICD-10-CM | POA: Diagnosis not present

## 2022-02-06 DIAGNOSIS — D649 Anemia, unspecified: Secondary | ICD-10-CM | POA: Diagnosis not present

## 2022-02-06 DIAGNOSIS — F411 Generalized anxiety disorder: Secondary | ICD-10-CM | POA: Diagnosis not present

## 2022-02-06 DIAGNOSIS — R4182 Altered mental status, unspecified: Secondary | ICD-10-CM | POA: Diagnosis not present

## 2022-02-06 DIAGNOSIS — F339 Major depressive disorder, recurrent, unspecified: Secondary | ICD-10-CM | POA: Diagnosis not present

## 2022-02-06 DIAGNOSIS — D62 Acute posthemorrhagic anemia: Secondary | ICD-10-CM | POA: Diagnosis present

## 2022-02-06 DIAGNOSIS — K259 Gastric ulcer, unspecified as acute or chronic, without hemorrhage or perforation: Secondary | ICD-10-CM | POA: Diagnosis not present

## 2022-02-06 DIAGNOSIS — Z923 Personal history of irradiation: Secondary | ICD-10-CM

## 2022-02-06 DIAGNOSIS — R262 Difficulty in walking, not elsewhere classified: Secondary | ICD-10-CM | POA: Diagnosis not present

## 2022-02-06 DIAGNOSIS — R488 Other symbolic dysfunctions: Secondary | ICD-10-CM | POA: Diagnosis not present

## 2022-02-06 DIAGNOSIS — Z79891 Long term (current) use of opiate analgesic: Secondary | ICD-10-CM

## 2022-02-06 DIAGNOSIS — M150 Primary generalized (osteo)arthritis: Secondary | ICD-10-CM | POA: Diagnosis not present

## 2022-02-06 DIAGNOSIS — R42 Dizziness and giddiness: Secondary | ICD-10-CM | POA: Diagnosis not present

## 2022-02-06 LAB — CBC WITH DIFFERENTIAL/PLATELET
Abs Immature Granulocytes: 0.03 10*3/uL (ref 0.00–0.07)
Basophils Absolute: 0 10*3/uL (ref 0.0–0.1)
Basophils Relative: 1 %
Eosinophils Absolute: 0 10*3/uL (ref 0.0–0.5)
Eosinophils Relative: 0 %
HCT: 23.7 % — ABNORMAL LOW (ref 36.0–46.0)
Hemoglobin: 7.8 g/dL — ABNORMAL LOW (ref 12.0–15.0)
Immature Granulocytes: 0 %
Lymphocytes Relative: 22 %
Lymphs Abs: 1.9 10*3/uL (ref 0.7–4.0)
MCH: 30.7 pg (ref 26.0–34.0)
MCHC: 32.9 g/dL (ref 30.0–36.0)
MCV: 93.3 fL (ref 80.0–100.0)
Monocytes Absolute: 0.8 10*3/uL (ref 0.1–1.0)
Monocytes Relative: 9 %
Neutro Abs: 6 10*3/uL (ref 1.7–7.7)
Neutrophils Relative %: 68 %
Platelets: 244 10*3/uL (ref 150–400)
RBC: 2.54 MIL/uL — ABNORMAL LOW (ref 3.87–5.11)
RDW: 13.2 % (ref 11.5–15.5)
WBC: 8.8 10*3/uL (ref 4.0–10.5)
nRBC: 0 % (ref 0.0–0.2)

## 2022-02-06 LAB — CBC
HCT: 20.9 % — ABNORMAL LOW (ref 36.0–46.0)
Hemoglobin: 6.7 g/dL — CL (ref 12.0–15.0)
MCH: 30.2 pg (ref 26.0–34.0)
MCHC: 32.1 g/dL (ref 30.0–36.0)
MCV: 94.1 fL (ref 80.0–100.0)
Platelets: 309 10*3/uL (ref 150–400)
RBC: 2.22 MIL/uL — ABNORMAL LOW (ref 3.87–5.11)
RDW: 13.2 % (ref 11.5–15.5)
WBC: 7.8 10*3/uL (ref 4.0–10.5)
nRBC: 0 % (ref 0.0–0.2)

## 2022-02-06 LAB — URINALYSIS, ROUTINE W REFLEX MICROSCOPIC
Bacteria, UA: NONE SEEN
Bilirubin Urine: NEGATIVE
Glucose, UA: NEGATIVE mg/dL
Ketones, ur: NEGATIVE mg/dL
Leukocytes,Ua: NEGATIVE
Nitrite: NEGATIVE
Protein, ur: NEGATIVE mg/dL
Specific Gravity, Urine: 1.014 (ref 1.005–1.030)
pH: 5 (ref 5.0–8.0)

## 2022-02-06 LAB — ETHANOL: Alcohol, Ethyl (B): <10 mg/dL (ref <10)

## 2022-02-06 LAB — BASIC METABOLIC PANEL
Anion gap: 11 (ref 5–15)
BUN: 46 mg/dL — ABNORMAL HIGH (ref 8–23)
CO2: 21 mmol/L — ABNORMAL LOW (ref 22–32)
Calcium: 8.3 mg/dL — ABNORMAL LOW (ref 8.9–10.3)
Chloride: 95 mmol/L — ABNORMAL LOW (ref 98–111)
Creatinine, Ser: 1.06 mg/dL — ABNORMAL HIGH (ref 0.44–1.00)
GFR, Estimated: 52 mL/min — ABNORMAL LOW (ref >60)
Glucose, Bld: 142 mg/dL — ABNORMAL HIGH (ref 70–99)
Potassium: 4.6 mmol/L (ref 3.5–5.1)
Sodium: 127 mmol/L — ABNORMAL LOW (ref 135–145)

## 2022-02-06 LAB — AMMONIA: Ammonia: 13 umol/L (ref 9–35)

## 2022-02-06 LAB — TROPONIN I (HIGH SENSITIVITY)
Troponin I (High Sensitivity): 5 ng/L (ref <18)
Troponin I (High Sensitivity): 6 ng/L (ref <18)

## 2022-02-06 LAB — POC OCCULT BLOOD, ED: Fecal Occult Bld: POSITIVE — AB

## 2022-02-06 LAB — HEPATIC FUNCTION PANEL
ALT: 10 U/L (ref 0–44)
AST: 15 U/L (ref 15–41)
Albumin: 3.1 g/dL — ABNORMAL LOW (ref 3.5–5.0)
Alkaline Phosphatase: 43 U/L (ref 38–126)
Bilirubin, Direct: 0.1 mg/dL (ref 0.0–0.2)
Indirect Bilirubin: 0.1 mg/dL — ABNORMAL LOW (ref 0.3–0.9)
Total Bilirubin: 0.2 mg/dL — ABNORMAL LOW (ref 0.3–1.2)
Total Protein: 6.1 g/dL — ABNORMAL LOW (ref 6.5–8.1)

## 2022-02-06 LAB — PREPARE RBC (CROSSMATCH)

## 2022-02-06 LAB — ABO/RH: ABO/RH(D): O POS

## 2022-02-06 LAB — CBG MONITORING, ED: Glucose-Capillary: 120 mg/dL — ABNORMAL HIGH (ref 70–99)

## 2022-02-06 LAB — LIPASE, BLOOD: Lipase: 22 U/L (ref 11–51)

## 2022-02-06 MED ORDER — ACETAMINOPHEN 650 MG RE SUPP: 650.0000 mg | Freq: Four times a day (QID) | RECTAL | Status: DC | PRN

## 2022-02-06 MED ORDER — LORAZEPAM 2 MG/ML IJ SOLN
0.0000 mg | Freq: Four times a day (QID) | INTRAMUSCULAR | Status: DC
  Administered 2022-02-06: 2 mg via INTRAVENOUS
  Filled 2022-02-06: qty 1

## 2022-02-06 MED ORDER — THIAMINE MONONITRATE 100 MG PO TABS
100.0000 mg | ORAL_TABLET | Freq: Every day | ORAL | Status: DC
  Administered 2022-02-06 – 2022-02-11 (×5): 100 mg via ORAL
  Filled 2022-02-06 (×6): qty 1

## 2022-02-06 MED ORDER — LORAZEPAM 2 MG/ML IJ SOLN: 0.0000 mg | Freq: Two times a day (BID) | INTRAMUSCULAR | Status: DC

## 2022-02-06 MED ORDER — ONDANSETRON HCL 4 MG/2ML IJ SOLN
4.0000 mg | Freq: Once | INTRAMUSCULAR | Status: AC
  Administered 2022-02-06: 4 mg via INTRAVENOUS
  Filled 2022-02-06: qty 2

## 2022-02-06 MED ORDER — ACETAMINOPHEN 325 MG PO TABS: 650.0000 mg | ORAL_TABLET | Freq: Four times a day (QID) | ORAL | Status: DC | PRN

## 2022-02-06 MED ORDER — FOLIC ACID 1 MG PO TABS
1.0000 mg | ORAL_TABLET | Freq: Every day | ORAL | Status: DC
  Administered 2022-02-06 – 2022-02-11 (×5): 1 mg via ORAL
  Filled 2022-02-06 (×6): qty 1

## 2022-02-06 MED ORDER — LORAZEPAM 2 MG/ML IJ SOLN: 1.0000 mg | INTRAMUSCULAR | Status: AC | PRN

## 2022-02-06 MED ORDER — ADULT MULTIVITAMIN W/MINERALS CH
1.0000 | ORAL_TABLET | Freq: Every day | ORAL | Status: DC
  Administered 2022-02-06 – 2022-02-11 (×5): 1 via ORAL
  Filled 2022-02-06 (×6): qty 1

## 2022-02-06 MED ORDER — ARIPIPRAZOLE 2 MG PO TABS
2.0000 mg | ORAL_TABLET | Freq: Every day | ORAL | Status: DC
  Administered 2022-02-08 – 2022-02-11 (×4): 2 mg via ORAL
  Filled 2022-02-06 (×7): qty 1

## 2022-02-06 MED ORDER — SODIUM CHLORIDE 0.9 % IV SOLN: INTRAVENOUS | Status: DC

## 2022-02-06 MED ORDER — ZOLPIDEM TARTRATE 5 MG PO TABS
5.0000 mg | ORAL_TABLET | Freq: Every day | ORAL | Status: DC
  Administered 2022-02-06 – 2022-02-10 (×5): 5 mg via ORAL
  Filled 2022-02-06 (×5): qty 1

## 2022-02-06 MED ORDER — ONDANSETRON HCL 4 MG PO TABS: 4.0000 mg | ORAL_TABLET | Freq: Four times a day (QID) | ORAL | Status: DC | PRN

## 2022-02-06 MED ORDER — ONDANSETRON HCL 4 MG/2ML IJ SOLN: 4.0000 mg | Freq: Four times a day (QID) | INTRAMUSCULAR | Status: DC | PRN

## 2022-02-06 MED ORDER — LORAZEPAM 1 MG PO TABS: 1.0000 mg | ORAL_TABLET | ORAL | Status: AC | PRN

## 2022-02-06 MED ORDER — OXYCODONE HCL 5 MG PO TABS: 5.0000 mg | ORAL_TABLET | ORAL | Status: DC | PRN

## 2022-02-06 MED ORDER — SODIUM CHLORIDE 0.9 % IV BOLUS
500.0000 mL | Freq: Once | INTRAVENOUS | Status: AC
  Administered 2022-02-06: 500 mL via INTRAVENOUS

## 2022-02-06 MED ORDER — DULOXETINE HCL 60 MG PO CPEP
120.0000 mg | ORAL_CAPSULE | Freq: Every day | ORAL | Status: DC
  Administered 2022-02-08 – 2022-02-11 (×4): 120 mg via ORAL
  Filled 2022-02-06 (×5): qty 2

## 2022-02-06 MED ORDER — THIAMINE HCL 100 MG/ML IJ SOLN
100.0000 mg | Freq: Every day | INTRAMUSCULAR | Status: DC
  Filled 2022-02-06: qty 2

## 2022-02-06 MED ORDER — METOCLOPRAMIDE HCL 5 MG/ML IJ SOLN
10.0000 mg | Freq: Once | INTRAMUSCULAR | Status: AC
  Administered 2022-02-06: 10 mg via INTRAVENOUS
  Filled 2022-02-06: qty 2

## 2022-02-06 MED ORDER — SODIUM CHLORIDE 0.9 % IV SOLN
10.0000 mL/h | Freq: Once | INTRAVENOUS | Status: AC
  Administered 2022-02-06: 10 mL/h via INTRAVENOUS

## 2022-02-06 MED ORDER — PANTOPRAZOLE INFUSION (NEW) - SIMPLE MED
8.0000 mg/h | INTRAVENOUS | Status: DC
  Administered 2022-02-06 – 2022-02-07 (×3): 8 mg/h via INTRAVENOUS
  Filled 2022-02-06 (×2): qty 80
  Filled 2022-02-06 (×3): qty 100

## 2022-02-06 MED ORDER — MORPHINE SULFATE (PF) 2 MG/ML IV SOLN: 2.0000 mg | INTRAVENOUS | Status: DC | PRN

## 2022-02-06 NOTE — Assessment & Plan Note (Signed)
BUN is elevated at 46 - Most likely secondary to GI bleed - Continue gentle fluid hydration - Continue to monitor - Anticipate elevation will remain until bleed can be stopped

## 2022-02-06 NOTE — Assessment & Plan Note (Signed)
-   FOBT positive - Hemoglobin down to 6.7 - Transfuse as below - GI to see patient in the a.m. - Continue Protonix drip - Monitor CBC

## 2022-02-06 NOTE — Assessment & Plan Note (Signed)
-   With bruising over bridge of nose - No intracranial abnormality on CT head - Dedicated imaging for facial bones ordered - PT eval and treat - With alcohol level being undetectable, it is possible withdrawal was contributing

## 2022-02-06 NOTE — ED Provider Notes (Signed)
[facility] EMERGENCY DEPARTMENT Provider Note   CSN: [id number] Arrival date & time: 02/06/22  1526     History Chief Complaint  Patient presents with   Weakness    Marilyn Haas is a 81 y.o. female.   Weakness Associated symptoms: nausea and vomiting   Associated symptoms: no abdominal pain, no chest pain, no diarrhea and no shortness of breath   Patient reports several days of nausea and dizziness, but cannot recall exact time that she fell. Patient currently lives alone is unable to provide a full HPI as she is obviously disoriented. Ex son-in-law, Liane Comber, is present with patient but has not seen her in approximately in about 1 year so is unaware if she is different from baseline. Denies chest pain, shortness of breath, diarrhea, dysuria, or hematuria.  No recent changes in medications that patient is aware of but she is a poor historian and inconsistent with history.     Home Medications Prior to Admission medications   Medication Sig Start Date End Date Taking? Authorizing Provider  ALPRAZolam (XANAX) 0.25 MG tablet Take 0.25-0.5 mg by mouth daily. 09/17/21  Yes [provider]  ARIPiprazole (ABILIFY) 2 MG tablet Take 2 mg by mouth daily. 06/28/19  Yes [provider]  Cholecalciferol (VITAMIN D3) 125 MCG (5000 UT) CAPS Take 1 capsule by mouth daily.   Yes [provider]  DULoxetine (CYMBALTA) 60 MG capsule Take 120 mg by mouth daily. 07/29/19  Yes [provider]  furosemide (LASIX) 20 MG tablet Take 20 mg by mouth daily as needed for fluid. 06/16/21  Yes [provider]  HYDROcodone-acetaminophen (NORCO) 10-325 MG tablet Take 1 tablet by mouth 3 (three) times daily. 11/14/21  Yes [provider]  losartan (COZAAR) 25 MG tablet TAKE ONE TABLET BY MOUTH TWICE A DAY 11/13/21  Yes Satira Sark, MD  omeprazole (PRILOSEC) 20 MG capsule Take 1 capsule (20 mg total) by mouth 2 (two) times daily before a meal. Patient taking  differently: Take 20 mg by mouth daily. 03/28/20  Yes Rehman, Mechele Dawley, MD  psyllium (METAMUCIL SMOOTH TEXTURE) 58.6 % powder Take 1 packet by mouth at bedtime. 03/28/20  Yes Rehman, Mechele Dawley, MD  tamoxifen (NOLVADEX) 10 MG tablet Take 1 tablet (10 mg total) by mouth every other day. 10/28/21  Yes Benay Pike, MD  zinc gluconate 50 MG tablet Take 50 mg by mouth daily.   Yes [provider]  zolpidem (AMBIEN) 5 MG tablet Take 5 mg by mouth at bedtime. 11/14/21  Yes [provider]      Allergies    Avelox [moxifloxacin hcl in nacl]    Review of Systems   Review of Systems  Constitutional:  Positive for activity change.  Respiratory:  Negative for chest tightness and shortness of breath.   Cardiovascular:  Negative for chest pain.  Gastrointestinal:  Positive for nausea and vomiting. Negative for abdominal pain, constipation and diarrhea.  Neurological:  Positive for syncope, weakness and light-headedness.  All other systems reviewed and are negative.   Physical Exam Updated Vital Signs BP (!) 126/55 (BP Location: Right Leg)   Pulse 84   Temp 98.7 F (37.1 C)   Resp 14   Ht '5\' 7"'$  (1.702 m)   Wt 67 kg   SpO2 94%   BMI 23.13 kg/m  Physical Exam Vitals and nursing note reviewed.  Constitutional:      General: She is not in acute distress.    Appearance: She is  normal weight. She is ill-appearing. She is not diaphoretic.  HENT:     Head: Normocephalic and atraumatic.     Nose: Nose normal.     Mouth/Throat:     Mouth: Mucous membranes are moist.     Pharynx: Oropharynx is clear. No oropharyngeal exudate or posterior oropharyngeal erythema.  Eyes:     General: No scleral icterus.       Right eye: No discharge.        Left eye: No discharge.     Extraocular Movements: Extraocular movements intact.     Conjunctiva/sclera: Conjunctivae normal.     Pupils: Pupils are equal, round, and reactive to light.  Cardiovascular:     Rate and Rhythm: Normal rate and  regular rhythm.     Heart sounds: Normal heart sounds.  Pulmonary:     Breath sounds: Normal breath sounds. No wheezing, rhonchi or rales.  Abdominal:     General: Abdomen is flat. Bowel sounds are normal. There is no distension.     Palpations: Abdomen is soft. There is no mass.     Tenderness: There is no guarding or rebound.     Hernia: No hernia is present.  Musculoskeletal:        General: No swelling or tenderness.     Right lower leg: No edema.     Left lower leg: No edema.  Skin:    General: Skin is warm and dry.     Capillary Refill: Capillary refill takes less than 2 seconds.     Findings: No bruising, erythema or rash.     Comments: Bruising along bridge of nose and right upper arm.  Neurological:     Mental Status: She is disoriented.     Motor: Weakness present.  Psychiatric:        Mood and Affect: Mood normal.        Speech: Speech is delayed.        Behavior: Behavior normal. Behavior is cooperative.        Cognition and Memory: Memory is impaired.     Comments: Patient appears to be confused but this is not clear if this is significantly removed from baseline.     ED Results / Procedures / Treatments   Labs (all labs ordered are listed, but only abnormal results are displayed) Labs Reviewed  BASIC METABOLIC PANEL - Abnormal; Notable for the following components:      Result Value   Sodium 127 (*)    Chloride 95 (*)    CO2 21 (*)    Glucose, Bld 142 (*)    BUN 46 (*)    Creatinine, Ser 1.06 (*)    Calcium 8.3 (*)    GFR, Estimated 52 (*)    All other components within normal limits  CBC - Abnormal; Notable for the following components:   RBC 2.22 (*)    Hemoglobin 6.7 (*)    HCT 20.9 (*)    All other components within normal limits  URINALYSIS, ROUTINE W REFLEX MICROSCOPIC - Abnormal; Notable for the following components:   Hgb urine dipstick SMALL (*)    All other components within normal limits  HEPATIC FUNCTION PANEL - Abnormal; Notable for the  following components:   Total Protein 6.1 (*)    Albumin 3.1 (*)    Total Bilirubin 0.2 (*)    Indirect Bilirubin 0.1 (*)    All other components within normal limits  COMPREHENSIVE METABOLIC PANEL - Abnormal; Notable for the following  components:   Sodium 131 (*)    BUN 39 (*)    Creatinine, Ser 1.01 (*)    Calcium 8.2 (*)    Total Protein 5.7 (*)    Albumin 2.9 (*)    AST 14 (*)    Alkaline Phosphatase 37 (*)    GFR, Estimated 55 (*)    All other components within normal limits  CBC WITH DIFFERENTIAL/PLATELET - Abnormal; Notable for the following components:   RBC 2.54 (*)    Hemoglobin 7.8 (*)    HCT 23.7 (*)    All other components within normal limits  CBC WITH DIFFERENTIAL/PLATELET - Abnormal; Notable for the following components:   RBC 2.57 (*)    Hemoglobin 7.8 (*)    HCT 24.0 (*)    All other components within normal limits  CBC WITH DIFFERENTIAL/PLATELET - Abnormal; Notable for the following components:   RBC 2.43 (*)    Hemoglobin 7.4 (*)    HCT 23.0 (*)    Abs Immature Granulocytes 0.08 (*)    All other components within normal limits  CBG MONITORING, ED - Abnormal; Notable for the following components:   Glucose-Capillary 120 (*)    All other components within normal limits  POC OCCULT BLOOD, ED - Abnormal; Notable for the following components:   Fecal Occult Bld POSITIVE (*)    All other components within normal limits  AMMONIA  ETHANOL  LIPASE, BLOOD  MAGNESIUM  POC OCCULT BLOOD, ED  TYPE AND SCREEN  ABO/RH  PREPARE RBC (CROSSMATCH)  SURGICAL PATHOLOGY  TROPONIN I (HIGH SENSITIVITY)  TROPONIN I (HIGH SENSITIVITY)    EKG EKG Interpretation  Date/Time:  Friday February 06 2022 15:56:12 EST Ventricular Rate:  95 PR Interval:  197 QRS Duration: 92 QT Interval:  366 QTC Calculation: 461 R Axis:   11 Text Interpretation: Sinus rhythm Normal ECG since last tracing no significant change Confirmed by Noemi Chapel [phone]) on 02/06/2022 4:22:45  PM  Radiology DG Facial Bones 1-2 Views  Result Date: 02/06/2022 CLINICAL DATA:  Status post fall. EXAM: FACIAL BONES - 1-2 VIEW COMPARISON:  None Available. FINDINGS: There is no evidence of an acute fracture or other significant bone abnormality. No orbital emphysema or sinus air-fluid levels are seen. IMPRESSION: No acute osseous abnormality. Electronically Signed   By: Virgina Norfolk M.D.   On: 02/06/2022 22:26   DG Chest 2 View  Result Date: 02/06/2022 CLINICAL DATA:  Fall. EXAM: CHEST - 2 VIEW COMPARISON:  12/07/2021 FINDINGS: Low volume film. The cardio pericardial silhouette is enlarged. Atelectasis or infiltrate noted at the right base with small right pleural effusion. Left lung clear. Telemetry leads overlie the chest. IMPRESSION: Low volume film with right base atelectasis or infiltrate and small right pleural effusion. Electronically Signed   By: Misty Stanley M.D.   On: 02/06/2022 17:09   DG Humerus Right  Result Date: 02/06/2022 CLINICAL DATA:  Fall. EXAM: RIGHT HUMERUS - 2+ VIEW COMPARISON:  None Available. FINDINGS: There is no evidence of fracture or other focal bone lesions. Soft tissues are unremarkable. IMPRESSION: Negative. Electronically Signed   By: Marijo Conception M.D.   On: 02/06/2022 16:59   CT Head Wo Contrast  Result Date: 02/06/2022 CLINICAL DATA:  Mental status changes.  Syncope. EXAM: CT HEAD WITHOUT CONTRAST TECHNIQUE: Contiguous axial images were obtained from the base of the skull through the vertex without intravenous contrast. RADIATION DOSE REDUCTION: This exam was performed according to the departmental dose-optimization program which includes automated  exposure control, adjustment of the mA and/or kV according to patient size and/or use of iterative reconstruction technique. COMPARISON:  MR head 12/08/2021, CT head 12/07/2021 FINDINGS: Brain: No evidence of acute infarction, hemorrhage, hydrocephalus, extra-axial collection or mass lesion/mass effect.  Vascular: There is minimal atherosclerotic calcification of the internal carotid arteries. No hyperdense vessels. Skull: Normal. Negative for fracture or focal lesion. Sinuses/Orbits: There is minimal mucosal thickening of the paranasal sinuses. Mastoid air cells are normally aerated. Other: None IMPRESSION: 1. No evidence of acute intracranial abnormality. 2. Minimal paranasal chronic sinus changes. Electronically Signed   By: Nolon Nations M.D.   On: 02/06/2022 16:44    Procedures .Critical Care  Performed by: Luvenia Heller, PA-C Authorized by: Luvenia Heller, PA-C   Critical care provider statement:    Critical care time (minutes):  30   Critical care was necessary to treat or prevent imminent or life-threatening deterioration of the following conditions: Critically low hemoglobin.   Critical care was time spent personally by me on the following activities:  Development of treatment plan with patient or surrogate, discussions with consultants, evaluation of patient's response to treatment, examination of patient, ordering and review of laboratory studies, ordering and review of radiographic studies, ordering and performing treatments and interventions, pulse oximetry, re-evaluation of patient's condition, review of old charts and obtaining history from patient or surrogate   I assumed direction of critical care for this patient from another provider in my specialty: no     Care discussed with: admitting provider   Comments:     Patient started on IV fluids, RBC transfusion, and PPI for suspected upper GI bleed based on combination of low Hgb, melena, and positive stool guiac   Medications Ordered in ED Medications  pantoprozole (PROTONIX) 80 mg /NS 100 mL infusion (8 mg/hr Intravenous New Bag/Given 02/07/22 0453)  ARIPiprazole (ABILIFY) tablet 2 mg ( Oral MAR Unhold 02/07/22 1054)  DULoxetine (CYMBALTA) DR capsule 120 mg ( Oral MAR Unhold 02/07/22 1054)  zolpidem (AMBIEN) tablet 5 mg (  Oral MAR Unhold 02/07/22 1054)  acetaminophen (TYLENOL) tablet 650 mg ( Oral MAR Unhold 02/07/22 1054)    Or  acetaminophen (TYLENOL) suppository 650 mg ( Rectal MAR Unhold 02/07/22 1054)  oxyCODONE (Oxy IR/ROXICODONE) immediate release tablet 5 mg ( Oral MAR Unhold 02/07/22 1054)  morphine (PF) 2 MG/ML injection 2 mg ( Intravenous MAR Unhold 02/07/22 1054)  ondansetron (ZOFRAN) tablet 4 mg ( Oral MAR Unhold 02/07/22 1054)    Or  ondansetron (ZOFRAN) injection 4 mg ( Intravenous MAR Unhold 02/07/22 1054)  LORazepam (ATIVAN) tablet 1-4 mg ( Oral MAR Unhold 02/07/22 1054)    Or  LORazepam (ATIVAN) injection 1-4 mg ( Intravenous MAR Unhold 02/07/22 1054)  thiamine (VITAMIN B1) tablet 100 mg ( Oral MAR Unhold 02/07/22 1054)    Or  thiamine (VITAMIN B1) injection 100 mg ( Intravenous MAR Unhold 27/06/23 7628)  folic acid (FOLVITE) tablet 1 mg ( Oral MAR Unhold 02/07/22 1054)  multivitamin with minerals tablet 1 tablet ( Oral MAR Unhold 02/07/22 1054)  LORazepam (ATIVAN) injection 0-4 mg ( Intravenous MAR Unhold 02/07/22 1054)    Followed by  LORazepam (ATIVAN) injection 0-4 mg ( Intravenous MAR Unhold 02/07/22 1054)  ondansetron (ZOFRAN) injection 4 mg (4 mg Intravenous Given 02/06/22 1725)  sodium chloride 0.9 % bolus 500 mL (0 mLs Intravenous Stopped 02/06/22 2154)  metoCLOPramide (REGLAN) injection 10 mg (10 mg Intravenous Given 02/06/22 1833)  0.9 %  sodium chloride infusion (0 mL/hr  Intravenous Stopped 02/06/22 2155)    ED Course/ Medical Decision Making/ A&P Clinical Course as of 02/07/22 1200  Fri Feb 06, 2022  1751 POC occult blood, ED Provider will collect [OZ]    Clinical Course User Index [OZ] Luvenia Heller, PA-C                           Medical Decision Making Amount and/or Complexity of Data Reviewed Independent Historian: friend    Details: Ex son-in-law Labs: ordered. Decision-making details documented in ED Course. Radiology: ordered and independent  interpretation performed. Decision-making details documented in ED Course. ECG/medicine tests: ordered and independent interpretation performed. Decision-making details documented in ED Course.  Risk Prescription drug management. Decision regarding hospitalization.   This patient presents to the ED for concern of weakness, this involves an extensive number of treatment options, and is a complaint that carries with it a high risk of complications and morbidity.  The differential diagnosis includes stroke, lower GI bleed, esophageal varices, wernicke's encephalopathy, and altered mental status   Co morbidities that complicate the patient evaluation  Anemia, GERD, hypertension, cancer   Additional history obtained:  Additional history obtained from ex-son-in-law External records from outside source obtained and reviewed including ER visit from 12/07/2021 for similar symptoms as well as upper endoscopies from 02/27/2022, 01/18/2022, and 09/24/2021.   Lab Tests:  I Ordered, and personally interpreted labs.  The pertinent results include:  Critically low Hgb, low HCT and RBC, positive fecal occult blood   Imaging Studies ordered:  I ordered imaging studies including CT head wo contrast, xrays of face, chest, and right humerus  I independently visualized and interpreted imaging which showed small right pleural effusion. No fractures or evidence of active bleeding seen on xrays or CT of head. I agree with the radiologist interpretation   Cardiac Monitoring: / EKG:  The patient was maintained on a cardiac monitor.  I personally viewed and interpreted the cardiac monitored which showed an underlying rhythm of: sinus rhythm   Consultations Obtained:  I requested consultation with GI, and discussed lab and imaging findings as well as pertinent plan - they recommend: inpatient admission for stabilization due to risk of further deterioration.   Problem List / ED Course / Critical  interventions / Medication management  Patient presented with weakness and ground level fall. She reports that she is unaware of how she fell, but states that she hit her head and right arm in the process of falling. Communicating with patient was difficult as her history was inconsistent and story would change depending on how questions were asked. Based on critically low hemoglobin and positive hemoccult from stool sample, decision was made to provide blood transfusion after type and cross was collected. Consulted GI for inpatient admission for further stabilization with goal of upper endoscopy being performed once patient was stabilized. I ordered medication including pantoprazole, IV fluids, and 2 units of blood transfusion for GI bleed.  Reevaluation of the patient after these medicines showed that the patient improved. I have reviewed the patients home medicines and have made adjustments as needed   Social Determinants of Health:  Patient lives alone and has limited mobility.   Test / Admission - Considered:  Patient admitted for further stabilization of GI bleed. Plan is to ensure patient is stabilized and then have GI proceed with upper endoscopy to determine site of bleed.  Final Clinical Impression(s) / ED Diagnoses Final diagnoses:  Gastrointestinal hemorrhage with  melena  Fall at home, initial encounter    Rx / DC Orders ED Discharge Orders     None         Vladimir Creeks 02/07/22 1202    Noemi Chapel, MD 02/07/22 1454

## 2022-02-06 NOTE — Assessment & Plan Note (Signed)
-   Hemoglobin down to 6.7 - Review his hemoglobin in September was 12.7, although baseline seems to be 10-11 - FOBT positive - GI consulted and will see patient in the a.m. - Monitor CBC every 6 hours - 2 units transfusion in progress - Continue Protonix drip - N.p.o. except for sips and chips - Continue to monitor

## 2022-02-06 NOTE — Assessment & Plan Note (Signed)
-   Continue Abilify and Cymbalta - Continue to monitor

## 2022-02-06 NOTE — Progress Notes (Incomplete)
Removed 2 hearing aids for scan. Patient replaced right ear but could not get the left ear in. Hearing aid placed in ziploc bag and given back to patient.

## 2022-02-06 NOTE — H&P (Signed)
History and Physical    Patient: Marilyn Haas HEN:[id number] DOB: 16-Jan-1941 DOA: 02/06/2022 DOS: the patient was seen and examined on 02/06/2022 PCP: Redmond School, MD  Patient coming from: Home  Chief Complaint:  Chief Complaint  Patient presents with   Weakness   HPI: Marilyn Haas is a 81 y.o. female with medical history significant of anemia, depression, GERD, hard of hearing, alcohol abuse, hypertension, osteopenia, and more presents to the ED with a chief complaint of fall.  Patient is a poor historian.  Family friend at bedside cannot fill in the blanks.  Patient reports that she became dizzy and fell this afternoon.  She reports she landed on the whole front of her body, and bruising over her nose indicates that is likely true.  She reports she did have shortness of breath, and that happens during these "spells."  She reports these "spells" have been happening for months.  She cannot quantify how many months.  She reports that happens once every 2-3 months.  She does have nausea and vomiting as well.  There was no hematemesis.  Patient reports hematochezia and melena.  The bright red blood is a small amount and she attributes it to hemorrhoids.  The melena has been present for months per her report.  Patient denies chest pain, palpitations, headache.  Patient has no other complaints at this time.  Review of systems is unreliable, as patient says no to everything, but then later states she did feel short of breath for she did feel dizzy.  Some not sure how many of the "no's" are accurate.  Patient does not smoke.  She drinks at least 4 beers a day, family friend seems to think it is more than that.  She does not use illicit drugs.  She is not vaccinated for COVID.  Patient is full code. Review of Systems: As mentioned in the history of present illness. All other systems reviewed and are negative. Past Medical History:  Diagnosis Date   Anemia    Anxiety    Arthritis     Breast cancer ([place]) 05/19/2016   Right breast   Chronic back pain    Scoliosis, stenosis   Depression    Diverticulosis    GERD (gastroesophageal reflux disease)    Hard of hearing    History of blood transfusion    History of colon polyps    History of hiatal hernia    History of pneumonia    Hypertension    Insomnia    Osteopenia    Osteoporosis    Personal history of radiation therapy    Thickened endometrium 09/29/2017   Past Surgical History:  Procedure Laterality Date   BREAST LUMPECTOMY WITH RADIOACTIVE SEED AND SENTINEL LYMPH NODE BIOPSY Right 07/31/2016   Procedure: BREAST LUMPECTOMY WITH RADIOACTIVE SEED AND SENTINEL LYMPH NODE BIOPSY, INJECT BLUE DYE RIGHT BREAST;  Surgeon: Fanny Skates, MD;  Location: [facility];  Service: General;  Laterality: Right;   BREAST LUMPECTOMY WITH RADIOACTIVE SEED LOCALIZATION Left 05/07/2021   Procedure: LEFT BREAST LUMPECTOMY WITH RADIOACTIVE SEED LOCALIZATION;  Surgeon: Erroll Luna, MD;  Location: [facility];  Service: General;  Laterality: Left;   COLONOSCOPY     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 07/04/2014   Procedure: LEFT CONVERSION TO TOTAL HIP ARTHROPLASTY;  Surgeon: Gaynelle Arabian, MD;  Location: [facility];  Service: Orthopedics;  Laterality: Left;   ESOPHAGEAL DILATION N/A 09/24/2017   Procedure: ESOPHAGEAL DILATION;  Surgeon: Rogene Houston, MD;  [facility];  Service: Endoscopy;  Laterality: N/A;   ESOPHAGEAL DILATION N/A 01/19/2020   Procedure: ESOPHAGEAL DILATION;  Surgeon: Harvel Quale, MD;  [facility];  Service: Gastroenterology;  Laterality: N/A;   ESOPHAGEAL DILATION  02/28/2020   Procedure: ESOPHAGEAL DILATION;  Surgeon: Rogene Houston, MD;  [facility];  Service: Endoscopy;;   ESOPHAGOGASTRODUODENOSCOPY N/A 01/26/2014   Procedure: ESOPHAGOGASTRODUODENOSCOPY (EGD);  Surgeon: Rogene Houston, MD;  [facility];  Service: Endoscopy;  Laterality: N/A;  1030    ESOPHAGOGASTRODUODENOSCOPY (EGD) WITH PROPOFOL N/A 09/24/2017   Procedure: ESOPHAGOGASTRODUODENOSCOPY (EGD) WITH PROPOFOL;  Surgeon: Rogene Houston, MD;  [facility];  Service: Endoscopy;  Laterality: N/A;  11:15   ESOPHAGOGASTRODUODENOSCOPY (EGD) WITH PROPOFOL N/A 01/19/2020   Procedure: ESOPHAGOGASTRODUODENOSCOPY (EGD) WITH PROPOFOL;  Surgeon: Harvel Quale, MD;  [facility];  Service: Gastroenterology;  Laterality: N/A;  1200   ESOPHAGOGASTRODUODENOSCOPY (EGD) WITH PROPOFOL N/A 02/28/2020   Procedure: ESOPHAGOGASTRODUODENOSCOPY (EGD) WITH PROPOFOL;  Surgeon: Rogene Houston, MD;  [facility];  Service: Endoscopy;  Laterality: N/A;  2:45   HIP FRACTURE SURGERY     05/2009 left -[facility]   HYSTEROSCOPY WITH D & C  05/24/2018   Procedure: DILATATION AND CURETTAGE /HYSTEROSCOPY (PROCEDURE # 2)/PAP SMEAR (PROCEDURE #1);  Surgeon: Jonnie Kind, MD;  Location: [facility];  Service: Gynecology;;   Venia Minks DILATION N/A 01/26/2014   Procedure: Keturah Shavers;  Surgeon: Rogene Houston, MD;  [facility];  Service: Endoscopy;  Laterality: N/A;   Social History:  reports that she quit smoking about 61 years ago. Her smoking use included cigarettes. She has a 1.00 pack-year smoking history. She has never been exposed to tobacco smoke. She has never used smokeless tobacco. She reports current alcohol use of about 15.0 standard drinks of alcohol per week. She reports that she does not use drugs.  Allergies  Allergen Reactions   Avelox [Moxifloxacin Hcl In Nacl] Other (See Comments)    Altered mental status    Family History  Problem Relation Age of Onset   Heart failure Mother    Diabetes Mother    Hypertension Mother    Depression Mother    Other Father        stomach ulcers; abd aneursym   Hypertension Sister    Heart failure Sister    Diabetes Sister    Depression Sister    Diabetes Sister        prediabetes   Cancer Maternal Aunt         breast    Prior to Admission medications   Medication Sig Start Date End Date Taking? Authorizing Provider  ALPRAZolam (XANAX) 0.25 MG tablet Take 0.25-0.5 mg by mouth daily. 09/17/21  Yes [provider]  ARIPiprazole (ABILIFY) 2 MG tablet Take 2 mg by mouth daily. 06/28/19  Yes [provider]  Cholecalciferol (VITAMIN D3) 125 MCG (5000 UT) CAPS Take 1 capsule by mouth daily.   Yes [provider]  DULoxetine (CYMBALTA) 60 MG capsule Take 120 mg by mouth daily. 07/29/19  Yes [provider]  furosemide (LASIX) 20 MG tablet Take 20 mg by mouth daily as needed for fluid. 06/16/21  Yes [provider]  HYDROcodone-acetaminophen (NORCO) 10-325 MG tablet Take 1 tablet by mouth 3 (three) times daily. 11/14/21  Yes [provider]  losartan (COZAAR) 25 MG tablet TAKE ONE TABLET BY MOUTH TWICE A DAY 11/13/21  Yes Domenic Polite,  Aloha Gell, MD  omeprazole (PRILOSEC) 20 MG capsule Take 1 capsule (20 mg total) by mouth 2 (two) times daily before a meal. Patient taking differently: Take 20 mg by mouth daily. 03/28/20  Yes Rehman, Mechele Dawley, MD  psyllium (METAMUCIL SMOOTH TEXTURE) 58.6 % powder Take 1 packet by mouth at bedtime. 03/28/20  Yes Rehman, Mechele Dawley, MD  tamoxifen (NOLVADEX) 10 MG tablet Take 1 tablet (10 mg total) by mouth every other day. 10/28/21  Yes Benay Pike, MD  zinc gluconate 50 MG tablet Take 50 mg by mouth daily.   Yes [provider]  zolpidem (AMBIEN) 5 MG tablet Take 5 mg by mouth at bedtime. 11/14/21  Yes [provider]    Physical Exam: Vitals:   02/06/22 1934 02/06/22 1938 02/06/22 1955 02/06/22 2115  BP: 134/63  (!) 127/57 130/68  Pulse: 96 95 88 97  Resp: (!) '25 17 17 '$ (!) 22  Temp: 97.8 F (36.6 C) 97.8 F (36.6 C) 98.3 F (36.8 C) 98.5 F (36.9 C)  TempSrc: Oral Oral Oral Oral  SpO2: 100% 100% 99% 100%  Weight:      Height:       1.  General: Patient lying supine in bed,  no acute distress   2.  Psychiatric: Alert and oriented x 3, mood and behavior normal for situation, pleasant and cooperative with exam   3. Neurologic: Speech and language are normal, face is symmetric, moves all 4 extremities voluntarily, at baseline without acute deficits on limited exam   4. HEENMT:  Evidence of trauma with bruising over the bridge of the nose, normocephalic, pupils reactive to light, neck is supple, trachea is midline, mucous membranes are moist   5. Respiratory : Lungs are clear to auscultation bilaterally without wheezing, rhonchi, rales, no cyanosis, no increase in work of breathing or accessory muscle use   6. Cardiovascular : Heart rate normal, rhythm is regular, no murmurs, rubs or gallops, trace peripheral edema, peripheral pulses palpated   7. Gastrointestinal:  Abdomen is soft, nondistended, nontender to palpation bowel sounds active, no masses or organomegaly palpated   8. Skin:  Skin is warm, dry and intact evidence of venous stasis changes in the lower extremities   9.Musculoskeletal:  No acute deformities or trauma, no asymmetry in tone, trace peripheral edema, peripheral pulses palpated, no tenderness to palpation in the extremities  Data Reviewed: In the ED Temp 97.7-98.3, heart rate 88-103, respiratory rate 14-2, blood pressure 108/57-139/74, oxygen sats 99% Trope 5, 6 Ammonia 13 FOBT positive No leukocytosis with white blood cell count of 7.8, hemoglobin 6.7 Slight hyponatremia at 127, elevated BUN at 46 Albumin slightly low at 3.1 Patient was typed and screened 2 units of packed red blood cells ordered Alcohol level was undetectable>> patient may have fallen due to withdrawal CT head showed no evidence of acute intracranial abnormality Chest x-ray showed low volume possible right pleural effusion Right humerus negative for fracture Patient was given Reglan, Zofran, Protonix GI consulted and will see patient in the a.m.  Assessment and Plan: * GI bleed - FOBT  positive - Hemoglobin down to 6.7 - Transfuse as below - GI to see patient in the a.m. - Continue Protonix drip - Monitor CBC  Elevated BUN BUN is elevated at 46 - Most likely secondary to GI bleed - Continue gentle fluid hydration - Continue to monitor - Anticipate elevation will remain until bleed can be stopped  Depression - Continue Abilify and Cymbalta - Continue to monitor  Fall at home, initial encounter - With bruising over bridge of nose - No intracranial abnormality on CT head - Dedicated imaging for facial bones ordered - PT eval and treat - With alcohol level being undetectable, it is possible withdrawal was contributing  Blood loss anemia - Hemoglobin down to 6.7 - Review his hemoglobin in September was 12.7, although baseline seems to be 10-11 - FOBT positive - GI consulted and will see patient in the a.m. - Monitor CBC every 6 hours - 2 units transfusion in progress - Continue Protonix drip - N.p.o. except for sips and chips - Continue to monitor  Essential hypertension - Holding losartan in the setting of soft pressures and blood loss anemia      Advance Care Planning:   Code Status: Prior full  Consults: GI  Family Communication: Family friend at bedside  Severity of Illness: The appropriate patient status for this patient is INPATIENT. Inpatient status is judged to be reasonable and necessary in order to provide the required intensity of service to ensure the patient's safety. The patient's presenting symptoms, physical exam findings, and initial radiographic and laboratory data in the context of their chronic comorbidities is felt to place them at high risk for further clinical deterioration. Furthermore, it is not anticipated that the patient will be medically stable for discharge from the hospital within 2 midnights of admission.   * I certify that at the point of admission it is my clinical judgment that the patient will require inpatient  hospital care spanning beyond 2 midnights from the point of admission due to high intensity of service, high risk for further deterioration and high frequency of surveillance required.*  Author: Rolla Plate, DO 02/06/2022 9:35 PM  For on call review [email or web address].

## 2022-02-06 NOTE — ED Triage Notes (Signed)
Pt c/o weakness , dizziness and fell . Pt can't remember  when she fell but has a purple bruise to her nose. Pt is nauseous a few days. Pt lives alone.

## 2022-02-06 NOTE — ED Notes (Signed)
Patient transported to X-ray 

## 2022-02-06 NOTE — Assessment & Plan Note (Signed)
-   Holding losartan in the setting of soft pressures and blood loss anemia

## 2022-02-07 ENCOUNTER — Inpatient Hospital Stay (HOSPITAL_COMMUNITY): Payer: Medicare Other | Admitting: Anesthesiology

## 2022-02-07 ENCOUNTER — Encounter (HOSPITAL_COMMUNITY): Payer: Self-pay | Admitting: Family Medicine

## 2022-02-07 ENCOUNTER — Encounter (HOSPITAL_COMMUNITY)
Admission: EM | Disposition: A | Discharge status: Skilled Nursing Facility | Payer: Self-pay | Source: Home / Self Care | Attending: Internal Medicine

## 2022-02-07 DIAGNOSIS — K222 Esophageal obstruction: Secondary | ICD-10-CM | POA: Diagnosis not present

## 2022-02-07 DIAGNOSIS — F339 Major depressive disorder, recurrent, unspecified: Secondary | ICD-10-CM | POA: Diagnosis not present

## 2022-02-07 DIAGNOSIS — K449 Diaphragmatic hernia without obstruction or gangrene: Secondary | ICD-10-CM

## 2022-02-07 DIAGNOSIS — D649 Anemia, unspecified: Secondary | ICD-10-CM

## 2022-02-07 DIAGNOSIS — K259 Gastric ulcer, unspecified as acute or chronic, without hemorrhage or perforation: Secondary | ICD-10-CM | POA: Diagnosis not present

## 2022-02-07 DIAGNOSIS — K921 Melena: Secondary | ICD-10-CM

## 2022-02-07 DIAGNOSIS — D500 Iron deficiency anemia secondary to blood loss (chronic): Secondary | ICD-10-CM

## 2022-02-07 DIAGNOSIS — I10 Essential (primary) hypertension: Secondary | ICD-10-CM | POA: Diagnosis not present

## 2022-02-07 HISTORY — PX: BIOPSY: SHX5522

## 2022-02-07 HISTORY — PX: ESOPHAGOGASTRODUODENOSCOPY (EGD) WITH PROPOFOL: SHX5813

## 2022-02-07 LAB — COMPREHENSIVE METABOLIC PANEL
ALT: 8 U/L (ref 0–44)
AST: 14 U/L — ABNORMAL LOW (ref 15–41)
Albumin: 2.9 g/dL — ABNORMAL LOW (ref 3.5–5.0)
Alkaline Phosphatase: 37 U/L — ABNORMAL LOW (ref 38–126)
Anion gap: 7 (ref 5–15)
BUN: 39 mg/dL — ABNORMAL HIGH (ref 8–23)
CO2: 22 mmol/L (ref 22–32)
Calcium: 8.2 mg/dL — ABNORMAL LOW (ref 8.9–10.3)
Chloride: 102 mmol/L (ref 98–111)
Creatinine, Ser: 1.01 mg/dL — ABNORMAL HIGH (ref 0.44–1.00)
GFR, Estimated: 55 mL/min — ABNORMAL LOW (ref >60)
Glucose, Bld: 91 mg/dL (ref 70–99)
Potassium: 4.3 mmol/L (ref 3.5–5.1)
Sodium: 131 mmol/L — ABNORMAL LOW (ref 135–145)
Total Bilirubin: 0.5 mg/dL (ref 0.3–1.2)
Total Protein: 5.7 g/dL — ABNORMAL LOW (ref 6.5–8.1)

## 2022-02-07 LAB — CBC WITH DIFFERENTIAL/PLATELET
Abs Immature Granulocytes: 0.06 10*3/uL (ref 0.00–0.07)
Abs Immature Granulocytes: 0.08 10*3/uL — ABNORMAL HIGH (ref 0.00–0.07)
Basophils Absolute: 0.1 10*3/uL (ref 0.0–0.1)
Basophils Absolute: 0.1 10*3/uL (ref 0.0–0.1)
Basophils Relative: 1 %
Basophils Relative: 1 %
Eosinophils Absolute: 0.1 10*3/uL (ref 0.0–0.5)
Eosinophils Absolute: 0.1 10*3/uL (ref 0.0–0.5)
Eosinophils Relative: 1 %
Eosinophils Relative: 1 %
HCT: 24 % — ABNORMAL LOW (ref 36.0–46.0)
HCT: 23 % — ABNORMAL LOW (ref 36.0–46.0)
Hemoglobin: 7.8 g/dL — ABNORMAL LOW (ref 12.0–15.0)
Hemoglobin: 7.4 g/dL — ABNORMAL LOW (ref 12.0–15.0)
Immature Granulocytes: 1 %
Immature Granulocytes: 1 %
Lymphocytes Relative: 24 %
Lymphocytes Relative: 18 %
Lymphs Abs: 2.2 10*3/uL (ref 0.7–4.0)
Lymphs Abs: 1.8 10*3/uL (ref 0.7–4.0)
MCH: 30.4 pg (ref 26.0–34.0)
MCH: 30.5 pg (ref 26.0–34.0)
MCHC: 32.5 g/dL (ref 30.0–36.0)
MCHC: 32.2 g/dL (ref 30.0–36.0)
MCV: 93.4 fL (ref 80.0–100.0)
MCV: 94.7 fL (ref 80.0–100.0)
Monocytes Absolute: 0.8 10*3/uL (ref 0.1–1.0)
Monocytes Absolute: 0.7 10*3/uL (ref 0.1–1.0)
Monocytes Relative: 9 %
Monocytes Relative: 8 %
Neutro Abs: 5.7 10*3/uL (ref 1.7–7.7)
Neutro Abs: 6.9 10*3/uL (ref 1.7–7.7)
Neutrophils Relative %: 64 %
Neutrophils Relative %: 71 %
Platelets: 249 10*3/uL (ref 150–400)
Platelets: 242 10*3/uL (ref 150–400)
RBC: 2.57 MIL/uL — ABNORMAL LOW (ref 3.87–5.11)
RBC: 2.43 MIL/uL — ABNORMAL LOW (ref 3.87–5.11)
RDW: 13.2 % (ref 11.5–15.5)
RDW: 13.5 % (ref 11.5–15.5)
WBC: 8.9 10*3/uL (ref 4.0–10.5)
WBC: 9.6 10*3/uL (ref 4.0–10.5)
nRBC: 0 % (ref 0.0–0.2)
nRBC: 0 % (ref 0.0–0.2)

## 2022-02-07 LAB — MAGNESIUM: Magnesium: 2.1 mg/dL (ref 1.7–2.4)

## 2022-02-07 SURGERY — ESOPHAGOGASTRODUODENOSCOPY (EGD) WITH PROPOFOL
Anesthesia: General

## 2022-02-07 MED ORDER — PANTOPRAZOLE SODIUM 40 MG PO TBEC
40.0000 mg | DELAYED_RELEASE_TABLET | Freq: Two times a day (BID) | ORAL | Status: DC
  Administered 2022-02-07 – 2022-02-11 (×8): 40 mg via ORAL
  Filled 2022-02-07 (×8): qty 1

## 2022-02-07 MED ORDER — SUCRALFATE 1 GM/10ML PO SUSP
1.0000 g | Freq: Two times a day (BID) | ORAL | Status: DC
  Administered 2022-02-07 – 2022-02-11 (×8): 1 g via ORAL
  Filled 2022-02-07 (×8): qty 10

## 2022-02-07 MED ORDER — SODIUM CHLORIDE 0.9 % IV SOLN: INTRAVENOUS | Status: DC

## 2022-02-07 MED ORDER — LACTATED RINGERS IV SOLN: INTRAVENOUS | Status: DC | PRN

## 2022-02-07 MED ORDER — PROPOFOL 10 MG/ML IV BOLUS
INTRAVENOUS | Status: DC | PRN
  Administered 2022-02-07: 120 mg via INTRAVENOUS

## 2022-02-07 NOTE — Transfer of Care (Signed)
Immediate Anesthesia Transfer of Care Note  Patient: [place]  Procedure(s) Performed: ESOPHAGOGASTRODUODENOSCOPY (EGD) WITH PROPOFOL BIOPSY  Patient Location: PACU  Anesthesia Type:General  Level of Consciousness: drowsy  Airway & Oxygen Therapy: Patient Spontanous Breathing  Post-op Assessment: Report given to RN and Post -op Vital signs reviewed and stable  Post vital signs: Reviewed and stable  Last Vitals:  Vitals Value Taken Time  BP 121/56 02/07/22 1032  Temp 36.8 C 02/07/22 1032  Pulse 80 02/07/22 1038  Resp 17 02/07/22 1039  SpO2 95 % 02/07/22 1038  Vitals shown include unvalidated device data.  Last Pain:  Vitals:   02/07/22 1032  TempSrc:   PainSc: Asleep      Patients Stated Pain Goal: 7 (72/07/21 8288)  Complications: No notable events documented.

## 2022-02-07 NOTE — Therapy (Signed)
Arrived at patient room for physical therapy evaluation; she is being transported for testing.  Will return for evaluation later today or tomorrow.  9:45 AM, 02/07/22 Luisalberto Beegle Small Vanisha Whiten MPT [facility] [place] [phone]

## 2022-02-07 NOTE — Anesthesia Postprocedure Evaluation (Signed)
Anesthesia Post Note  Patient: [employer]  Procedure(s) Performed: ESOPHAGOGASTRODUODENOSCOPY (EGD) WITH PROPOFOL BIOPSY  Patient location during evaluation: PACU Anesthesia Type: General Level of consciousness: awake and alert Pain management: pain level controlled Vital Signs Assessment: post-procedure vital signs reviewed and stable Respiratory status: spontaneous breathing, nonlabored ventilation, respiratory function stable and patient connected to nasal cannula oxygen Cardiovascular status: blood pressure returned to baseline and stable Postop Assessment: no apparent nausea or vomiting Anesthetic complications: no   No notable events documented.   Last Vitals:  Vitals:   02/07/22 1015 02/07/22 1032  BP: 114/87 (!) 121/56  Pulse: 90 84  Resp: 16 18  Temp: 37.1 C 36.8 C  SpO2: 95% 94%    Last Pain:  Vitals:   02/07/22 1032  TempSrc:   PainSc: [place]

## 2022-02-07 NOTE — Anesthesia Preprocedure Evaluation (Signed)
Anesthesia Evaluation  Patient identified by MRN, date of birth, ID band Patient awake    Reviewed: Allergy & Precautions, H&P , NPO status , Patient's Chart, lab work & pertinent test results, reviewed documented beta blocker date and time   Airway Mallampati: II  TM Distance: >3 FB Neck ROM: full    Dental no notable dental hx.    Pulmonary neg pulmonary ROS, former smoker   Pulmonary exam normal breath sounds clear to auscultation       Cardiovascular Exercise Tolerance: Good hypertension, negative cardio ROS  Rhythm:regular Rate:Normal     Neuro/Psych  PSYCHIATRIC DISORDERS Anxiety Depression    negative neurological ROS  negative psych ROS   GI/Hepatic negative GI ROS, Neg liver ROS, hiatal hernia,GERD  ,,  Endo/Other  negative endocrine ROS    Renal/GU negative Renal ROS  negative genitourinary   Musculoskeletal   Abdominal   Peds  Hematology negative hematology ROS (+) Blood dyscrasia, anemia   Anesthesia Other Findings   Reproductive/Obstetrics negative OB ROS                             Anesthesia Physical Anesthesia Plan  ASA: 3 and emergent  Anesthesia Plan: General   Post-op Pain Management:    Induction:   PONV Risk Score and Plan: Propofol infusion  Airway Management Planned:   Additional Equipment:   Intra-op Plan:   Post-operative Plan:   Informed Consent: I have reviewed the patients History and Physical, chart, labs and discussed the procedure including the risks, benefits and alternatives for the proposed anesthesia with the patient or authorized representative who has indicated his/her understanding and acceptance.     Dental Advisory Given  Plan Discussed with: CRNA  Anesthesia Plan Comments:        Anesthesia Quick Evaluation

## 2022-02-07 NOTE — Consult Note (Signed)
Maylon Peppers, M.D. Gastroenterology & Hepatology                                           Patient Name: Marilyn Haas Account #: _0 @   MRN: [id number] Admission Date: 02/06/2022 Date of Evaluation:  02/07/2022 Time of Evaluation: 8:46 AM   Referring Physician: Noemi Chapel, MD  Chief Complaint: Fatigue, severe anemia  HPI:  This is a 81 y.o. female with history of breast cancer, depression, scoliosis, GERD, hypertension, anxiety and arthritis, who came to the hospital after presenting worsening fatigue.  Patient was found to have severe anemia for which gastroenterology was consulted.  Patient is not wearing her hearing aids today and communication is limited.  Family member is at the bedside and helps provide some of the medical history.  Patient is a poor historian.  Patient reports having worsening fatigue and lightheadedness since yesterday afternoon which led to a syncopal episode yesterday.  Has had intermittent episodes of lightheadedness for the last few months and intermittent shortness of breath.  She denies having any melena, hematemesis, hematochezia, abdominal pain, nausea or vomiting.  Notably, the patient reports that she believes she takes ibuprofen intermittently for pain.  Denies taking any anticoagulants.  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  There was also presence of a moderate Schatzki's ring at the GE junction which was dilated up to 18 mm.  There was a large 9 cm hiatal hernia.  Stomach and duodenum were within normal limits.  It is unclear when she had her last colonoscopy.  In the ED, she was HD stable and afebrile. Labs were remarkable for hemoglobin 6.7, MCV 94, normal other cell lines, CMP with sodium of 127, creatinine 1.06, BUN 46, normal liver function tests, positive fecal occult blood test.  Troponins were  negative x 2.  Patient was transfused 1 unit of PRBC with adequate response up to 7.8.  Past Medical History: SEE CHRONIC ISSSUES: Past Medical History:  Diagnosis Date   Anemia    Anxiety    Arthritis    Breast cancer ([facility]) 05/19/2016   Right breast   Chronic back pain    Scoliosis, stenosis   Depression    Diverticulosis    GERD (gastroesophageal reflux disease)    Hard of hearing    History of blood transfusion    History of colon polyps    History of hiatal hernia    History of pneumonia    Hypertension    Insomnia    Osteopenia    Osteoporosis    Personal history of radiation therapy    Thickened endometrium 09/29/2017   Past Surgical History:  Past Surgical History:  Procedure Laterality Date   BREAST LUMPECTOMY WITH RADIOACTIVE SEED AND SENTINEL LYMPH NODE BIOPSY Right 07/31/2016   Procedure: BREAST LUMPECTOMY WITH RADIOACTIVE SEED AND SENTINEL LYMPH NODE BIOPSY, INJECT BLUE DYE RIGHT BREAST;  Surgeon: Fanny Skates, MD;  Location: [facility];  Service: General;  Laterality: Right;   BREAST LUMPECTOMY WITH RADIOACTIVE SEED LOCALIZATION Left 05/07/2021   Procedure: LEFT BREAST LUMPECTOMY WITH RADIOACTIVE SEED LOCALIZATION;  Surgeon: Erroll Luna, MD;  Location: [facility];  Service: General;  Laterality: Left;   COLONOSCOPY     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 07/04/2014  Procedure: LEFT CONVERSION TO TOTAL HIP ARTHROPLASTY;  Surgeon: Gaynelle Arabian, MD;  Location: [facility];  Service: Orthopedics;  Laterality: Left;   ESOPHAGEAL DILATION N/A 09/24/2017   Procedure: ESOPHAGEAL DILATION;  Surgeon: Rogene Houston, MD;  [facility];  Service: Endoscopy;  Laterality: N/A;   ESOPHAGEAL DILATION N/A 01/19/2020   Procedure: ESOPHAGEAL DILATION;  Surgeon: Harvel Quale, MD;  [facility];  Service: Gastroenterology;  Laterality: N/A;   ESOPHAGEAL DILATION  02/28/2020   Procedure: ESOPHAGEAL DILATION;  Surgeon: Rogene Houston, MD;   [facility];  Service: Endoscopy;;   ESOPHAGOGASTRODUODENOSCOPY N/A 01/26/2014   Procedure: ESOPHAGOGASTRODUODENOSCOPY (EGD);  Surgeon: Rogene Houston, MD;  [facility];  Service: Endoscopy;  Laterality: N/A;  1030   ESOPHAGOGASTRODUODENOSCOPY (EGD) WITH PROPOFOL N/A 09/24/2017   Procedure: ESOPHAGOGASTRODUODENOSCOPY (EGD) WITH PROPOFOL;  Surgeon: Rogene Houston, MD;  [facility];  Service: Endoscopy;  Laterality: N/A;  11:15   ESOPHAGOGASTRODUODENOSCOPY (EGD) WITH PROPOFOL N/A 01/19/2020   Procedure: ESOPHAGOGASTRODUODENOSCOPY (EGD) WITH PROPOFOL;  Surgeon: Harvel Quale, MD;  [facility];  Service: Gastroenterology;  Laterality: N/A;  1200   ESOPHAGOGASTRODUODENOSCOPY (EGD) WITH PROPOFOL N/A 02/28/2020   Procedure: ESOPHAGOGASTRODUODENOSCOPY (EGD) WITH PROPOFOL;  Surgeon: Rogene Houston, MD;  [facility];  Service: Endoscopy;  Laterality: N/A;  2:45   HIP FRACTURE SURGERY     05/2009 left -[facility]   HYSTEROSCOPY WITH D & C  05/24/2018   Procedure: DILATATION AND CURETTAGE /HYSTEROSCOPY (PROCEDURE # 2)/PAP SMEAR (PROCEDURE #1);  Surgeon: Jonnie Kind, MD;  Location[facility];  Service: Gynecology;;   Venia Minks DILATION N/A 01/26/2014   Procedure: Keturah Shavers;  Surgeon: Rogene Houston, MD;  [facility];  Service: Endoscopy;  Laterality: N/A;   Family History:  Family History  Problem Relation Age of Onset   Heart failure Mother    Diabetes Mother    Hypertension Mother    Depression Mother    Other Father        stomach ulcers; abd aneursym   Hypertension Sister    Heart failure Sister    Diabetes Sister    Depression Sister    Diabetes Sister        prediabetes   Cancer Maternal Aunt        breast   Social History:  Social History   Tobacco Use   Smoking status: Former    Packs/day: 0.50    Years: 2.00    Total pack years: 1.00    Types: Cigarettes    Quit date: 01/15/1961    Years  since quitting: 61.1    Passive exposure: Never   Smokeless tobacco: Never  Vaping Use   Vaping Use: Never used  Substance Use Topics   Alcohol use: Yes    Alcohol/week: 15.0 standard drinks of alcohol    Types: 15 Cans of beer per week    Comment: daily   Drug use: No    Home Medications:  Prior to Admission medications   Medication Sig Start Date End Date Taking? Authorizing Provider  ALPRAZolam (XANAX) 0.25 MG tablet Take 0.25-0.5 mg by mouth daily. 09/17/21  Yes [provider]  ARIPiprazole (ABILIFY) 2 MG tablet Take 2 mg by mouth daily. 06/28/19  Yes [provider]  Cholecalciferol (VITAMIN D3) 125 MCG (5000 UT) CAPS Take 1 capsule by mouth daily.   Yes [provider]  DULoxetine (CYMBALTA) 60 MG capsule Take  120 mg by mouth daily. 07/29/19  Yes [provider]  furosemide (LASIX) 20 MG tablet Take 20 mg by mouth daily as needed for fluid. 06/16/21  Yes [provider]  HYDROcodone-acetaminophen (NORCO) 10-325 MG tablet Take 1 tablet by mouth 3 (three) times daily. 11/14/21  Yes [provider]  losartan (COZAAR) 25 MG tablet TAKE ONE TABLET BY MOUTH TWICE A DAY 11/13/21  Yes Satira Sark, MD  omeprazole (PRILOSEC) 20 MG capsule Take 1 capsule (20 mg total) by mouth 2 (two) times daily before a meal. Patient taking differently: Take 20 mg by mouth daily. 03/28/20  Yes Rehman, Mechele Dawley, MD  psyllium (METAMUCIL SMOOTH TEXTURE) 58.6 % powder Take 1 packet by mouth at bedtime. 03/28/20  Yes Rehman, Mechele Dawley, MD  tamoxifen (NOLVADEX) 10 MG tablet Take 1 tablet (10 mg total) by mouth every other day. 10/28/21  Yes Benay Pike, MD  zinc gluconate 50 MG tablet Take 50 mg by mouth daily.   Yes [provider]  zolpidem (AMBIEN) 5 MG tablet Take 5 mg by mouth at bedtime. 11/14/21  Yes [provider]    Inpatient Medications:  Current Facility-Administered Medications:    acetaminophen (TYLENOL) tablet 650 mg, 650 mg,  Oral, Q6H PRN **OR** acetaminophen (TYLENOL) suppository 650 mg, 650 mg, Rectal, Q6H PRN, Zierle-Ghosh, Asia B, DO   ARIPiprazole (ABILIFY) tablet 2 mg, 2 mg, Oral, Daily, Zierle-Ghosh, Asia B, DO   DULoxetine (CYMBALTA) DR capsule 120 mg, 120 mg, Oral, Daily, Zierle-Ghosh, Asia B, DO   folic acid (FOLVITE) tablet 1 mg, 1 mg, Oral, Daily, Zierle-Ghosh, Asia B, DO, 1 mg at 02/06/22 2355   LORazepam (ATIVAN) injection 0-4 mg, 0-4 mg, Intravenous, Q6H, 1 mg at 02/06/22 2356 **FOLLOWED BY** [START ON 02/08/2022] LORazepam (ATIVAN) injection 0-4 mg, 0-4 mg, Intravenous, Q12H, Zierle-Ghosh, Asia B, DO   LORazepam (ATIVAN) tablet 1-4 mg, 1-4 mg, Oral, Q1H PRN **OR** LORazepam (ATIVAN) injection 1-4 mg, 1-4 mg, Intravenous, Q1H PRN, Zierle-Ghosh, Asia B, DO   morphine (PF) 2 MG/ML injection 2 mg, 2 mg, Intravenous, Q2H PRN, Zierle-Ghosh, Asia B, DO   multivitamin with minerals tablet 1 tablet, 1 tablet, Oral, Daily, Zierle-Ghosh, Asia B, DO, 1 tablet at 02/06/22 2355   ondansetron (ZOFRAN) tablet 4 mg, 4 mg, Oral, Q6H PRN **OR** ondansetron (ZOFRAN) injection 4 mg, 4 mg, Intravenous, Q6H PRN, Zierle-Ghosh, Asia B, DO   oxyCODONE (Oxy IR/ROXICODONE) immediate release tablet 5 mg, 5 mg, Oral, Q4H PRN, Zierle-Ghosh, Asia B, DO   pantoprozole (PROTONIX) 80 mg /NS 100 mL infusion, 8 mg/hr, Intravenous, Continuous, Zierle-Ghosh, Asia B, DO, Last Rate: 10 mL/hr at 02/07/22 0453, 8 mg/hr at 02/07/22 0453   thiamine (VITAMIN B1) tablet 100 mg, 100 mg, Oral, Daily, 100 mg at 02/06/22 2355 **OR** thiamine (VITAMIN B1) injection 100 mg, 100 mg, Intravenous, Daily, Zierle-Ghosh, Asia B, DO   zolpidem (AMBIEN) tablet 5 mg, 5 mg, Oral, QHS, Zierle-Ghosh, Asia B, DO, 5 mg at 02/06/22 2355 Allergies: Avelox [moxifloxacin hcl in nacl]  Complete Review of Systems: GENERAL: negative for malaise, night sweats HEENT: No changes in hearing or vision, no nose bleeds or other nasal problems. NECK: Negative for lumps, goiter, pain  and significant neck swelling RESPIRATORY: Negative for cough, wheezing CARDIOVASCULAR: Negative for chest pain, leg swelling, palpitations, orthopnea GI: SEE HPI MUSCULOSKELETAL: Negative for joint pain or swelling, back pain, and muscle pain. SKIN: Negative for lesions, rash PSYCH: Negative for sleep disturbance, mood disorder and recent psychosocial stressors. HEMATOLOGY  Negative for prolonged bleeding, bruising easily, and swollen nodes. ENDOCRINE: Negative for cold or heat intolerance, polyuria, polydipsia and goiter. NEURO: negative for tremor, gait imbalance, syncope and seizures. The remainder of the review of systems is noncontributory.  Physical Exam: BP (!) 110/50 (BP Location: Right Arm)   Pulse 92   Temp 99.4 F (37.4 C)   Resp 16   Ht _0  (1.702 m)   Wt 67 kg   SpO2 96%   BMI 23.13 kg/m  GENERAL: The patient is AO x3, in no acute distress. HEENT: Head is normocephalic and atraumatic. EOMI are intact. Mouth is well hydrated and without lesions. NECK: Supple. No masses LUNGS: Clear to auscultation. No presence of rhonchi/wheezing/rales. Adequate chest expansion HEART: RRR, normal s1 and s2. ABDOMEN: Soft, nontender, no guarding, no peritoneal signs, and nondistended. BS +. No masses. RECTAL EXAM: deferred EXTREMITIES: Without any cyanosis, clubbing, rash, lesions or edema. NEUROLOGIC: AOx3, no focal motor deficit. SKIN: no jaundice, no rashes  Laboratory Data CBC:     Component Value Date/Time   WBC 8.9 02/07/2022 0438   RBC 2.57 (L) 02/07/2022 0438   HGB 7.8 (L) 02/07/2022 0438   HGB 11.4 (L) 10/28/2021 1423   HGB 11.2 (L) 12/21/2016 0931   HCT 24.0 (L) 02/07/2022 0438   HCT 35.0 12/21/2016 0931   PLT 249 02/07/2022 0438   PLT 262 10/28/2021 1423   PLT 179 12/21/2016 0931   MCV 93.4 02/07/2022 0438   MCV 94.1 12/21/2016 0931   MCH 30.4 02/07/2022 0438   MCHC 32.5 02/07/2022 0438   RDW 13.2 02/07/2022 0438   RDW 13.0 12/21/2016 0931   LYMPHSABS 2.2  02/07/2022 0438   LYMPHSABS 1.6 12/21/2016 0931   MONOABS 0.8 02/07/2022 0438   MONOABS 0.5 12/21/2016 0931   EOSABS 0.1 02/07/2022 0438   EOSABS 0.1 12/21/2016 0931   BASOSABS 0.1 02/07/2022 0438   BASOSABS 0.0 12/21/2016 0931   COAG:  Lab Results  Component Value Date   INR 1.03 10/11/2015   INR 1.01 06/26/2014   INR 1.6 (H) 06/14/2008    BMP:     Latest Ref Rng & Units 02/07/2022    4:38 AM 02/06/2022    4:20 PM 12/07/2021    7:53 PM  BMP  Glucose 70 - 99 mg/dL 91  142  109   BUN 8 - 23 mg/dL 39  46  13   Creatinine 0.44 - 1.00 mg/dL 1.01  1.06  0.96   Sodium 135 - 145 mmol/L 131  127  122   Potassium 3.5 - 5.1 mmol/L 4.3  4.6  4.5   Chloride 98 - 111 mmol/L 102  95  93   CO2 22 - 32 mmol/L _1 Calcium 8.9 - 10.3 mg/dL 8.2  8.3  8.7     HEPATIC:     Latest Ref Rng & Units 02/07/2022    4:38 AM 02/06/2022    4:20 PM 12/07/2021   10:30 PM  Hepatic Function  Total Protein 6.5 - 8.1 g/dL 5.7  6.1  6.8   Albumin 3.5 - 5.0 g/dL 2.9  3.1  3.6   AST 15 - 41 U/L _2 ALT 0 - 44 U/L _3 Alk Phosphatase 38 - 126 U/L 37  43  59   Total Bilirubin 0.3 - 1.2 mg/dL 0.5  0.2  0.7   Bilirubin, Direct 0.0 - 0.2 mg/dL  0.1  0.2     CARDIAC:  Lab Results  Component Value Date   CKTOTAL 145 07/04/2021   TROPONINI <0.03 10/11/2015     Imaging: I personally reviewed and interpreted the available imaging.  Assessment & Plan: Marilyn Haas is a 81 y.o. female with history of breast cancer, depression, scoliosis, GERD, hypertension, anxiety and arthritis, who came to the hospital after presenting worsening fatigue.  Patient was found to have severe anemia for which gastroenterology was consulted.  Patient presented severe acute onset of anemia which seems to be chronic progressive as she has not presented any overt gastrointestinal bleeding.  Has responded adequately to blood transfusion.  It is possible this is related to use of NSAIDs as reported by the  patient.  I explained that we will need to explore this further with an EGD today which she is agreeable to proceed with.  Will keep her on PPI twice daily for now.  - Repeat CBC qday, transfuse if Hb <7 - Pantoprazole 40 mg q12h IVP - 2 large bore IV lines - Active T/S - Keep NPO - Avoid NSAIDs - Will proceed with EGD today  Maylon Peppers, MD [facility]

## 2022-02-07 NOTE — Op Note (Addendum)
[facility] Patient Name: Marilyn Haas Procedure Date: 02/07/2022 10:04 AM MRN: [id number] Date of Birth: 1940/03/31 Attending MD: Maylon Peppers , , [id number] CSN: [id number] Age: 81 Admit Type: Inpatient Procedure:                Upper GI endoscopy Indications:              Anemia Providers:                Maylon Peppers, Lurline Del, RN, Casimer Bilis, Technician Referring MD:              Medicines:                Monitored Anesthesia Care Complications:            No immediate complications. Estimated Blood Loss:     Estimated blood loss: none. Procedure:                Pre-Anesthesia Assessment:                           - Prior to the procedure, a History and Physical                            was performed, and patient medications, allergies                            and sensitivities were reviewed. The patient's                            tolerance of previous anesthesia was reviewed.                           - The risks and benefits of the procedure and the                            sedation options and risks were discussed with the                            patient. All questions were answered and informed                            consent was obtained.                           - ASA Grade Assessment: III - A patient with severe                            systemic disease.                           After obtaining informed consent, the endoscope was                            passed under direct vision. Throughout the  procedure, the patient's blood pressure, pulse, and                            oxygen saturations were monitored continuously. The                            [id number]-[id number] ([id number]) scope was introduced through the                            mouth, and advanced to the second part of duodenum.                            The upper GI endoscopy was accomplished without                             difficulty. The patient tolerated the procedure                            well. Scope In: 10:20:58 AM Scope Out: 10:26:11 AM Total Procedure Duration: 0 hours 5 minutes 13 seconds  Findings:      One benign-appearing, intrinsic mild (non-circumferential scarring)       stenosis was found in the middle third of the esophagus. This stenosis       measured 1.6 cm (inner diameter) x less than one cm (in length). The       stenosis was traversed without difficulty.      A large 9 cm hiatal hernia with five Cameron ulcers was found. None of       the ulcers were actively bleeding.      The entire examined stomach was normal. Biopsies were taken with a cold       forceps for histology.      The examined duodenum was normal. Impression:               - Benign-appearing esophageal stenosis. Not dilated                            - non critical.                           - Large hiatal hernia with five Cameron ulcers.                           - Normal stomach. Biopsied.                           - Normal examined duodenum.                           Note: Anemia related to [facility] ulcers. Moderate Sedation:      Per Anesthesia Care Recommendation:           - Return patient to hospital ward for ongoing care.                           - Resume previous diet.                           -  Await pathology results.                           - Use Protonix (pantoprazole) 40 mg PO BID, need to                            discharge on omeprazole 40 mg BID.                           - Sucralfate 1 g every 12 hours for one month.                           - start Ferrous sulfate 325 mg qday                           - Repeat CBC in 2 weeks. If worsening recurring                            anemia, will need to refer for surgical hernia                            repair.                           - Follow up in GI clinic in 3-4 weeks.                           - Await pathology  results. Procedure Code(s):        --- Professional ---                           [phone], Esophagogastroduodenoscopy, flexible,                            transoral; with biopsy, single or multiple Diagnosis Code(s):        --- Professional ---                           K22.2, Esophageal obstruction                           K44.9, Diaphragmatic hernia without obstruction or                            gangrene                           K25.9, Gastric ulcer, unspecified as acute or                            chronic, without hemorrhage or perforation                           D64.9, Anemia, unspecified CPT copyright 2022 American [employer] Association. All rights reserved. The codes documented in this report are preliminary and upon coder review may  be revised to  meet current compliance requirements. Maylon Peppers, MD Maylon Peppers,  02/07/2022 10:35:04 AM This report has been signed electronically. Number of Addenda: 0

## 2022-02-07 NOTE — Progress Notes (Signed)
Once on the floor and settled, patient slept through the night. Continues to deny pain and shortness of breath but did say she felt dizzy when we transferred her from stretcher to bed.

## 2022-02-07 NOTE — Brief Op Note (Addendum)
02/06/2022 - 02/07/2022  10:33 AM  PATIENT:  Marilyn Haas  81 y.o. female  PRE-OPERATIVE DIAGNOSIS:  melena  POST-OPERATIVE DIAGNOSIS:  Marilyn Haas lesions;hiatal, [phone])  PROCEDURE:  Procedure(s): ESOPHAGOGASTRODUODENOSCOPY (EGD) WITH PROPOFOL (N/A) BIOPSY  SURGEON:  Surgeon(s) and Role:    * Harvel Quale, MD - Primary  Patient underwent EGD under propofol sedation.  Tolerated the procedure adequately.  Findings: - Benign-appearing esophageal stenosis. Not dilated - non critical. - Large hiatal hernia with five Cameron ulcers.  - Normal stomach.  Biopsied.  - Normal examined duodenum.   Note: Anemia related to [facility] ulcers.  RECOMMENDATIONS - Return patient to hospital ward for ongoing care.  - Resume previous diet.  - Await pathology results.  - Use Protonix (pantoprazole) 40 mg PO BID, need to discharge on omeprazole 40 mg BID. - Sucralfate 1 g every 12 hours for one month. - start Ferrous sulfate 325 mg qday - Repeat CBC in 2 weeks. If worsening recurring anemia, will need to refer for surgical hernia repair. - Follow up in GI clinic in 3-4 weeks. - GI service will sign-off, please call us back if you have any more questions.  Maylon Peppers, MD Gastroenterology and Hepatology [facility]

## 2022-02-07 NOTE — Progress Notes (Signed)
PROGRESS NOTE    Marilyn Haas  YIF:[id number] DOB: 05-Jun-1940 DOA: 02/06/2022 PCP: Redmond School, MD    Brief Narrative:  81 year old female with a history of GERD, alcohol use, depression, presents to the hospital after having a fall.  She reported becoming dizzy and falling.  She also reports having shortness of breath on exertion.  She admits to noticing hematochezia and melena which have reportedly been present for months.  Hemoglobin on admission noted to be 6.7.  FOBT positive.  Admitted for further GI evaluation and transfusion of PRBC.   Assessment & Plan:   Principal Problem:   GI bleed Active Problems:   Essential hypertension   Blood loss anemia   Fall at home, initial encounter   Depression   Elevated BUN   GI bleed - Noted to have melena and hematochezia on admission -Seen by GI and underwent EGD which showed hiatal hernia and Cameron ulcers. - Recommended patient to continue on PPI and Carafate - Monitor CBC    Depression - Continue Abilify and Cymbalta - Continue to monitor   Fall at home, initial encounter - With bruising over bridge of nose - No intracranial abnormality on CT head - Dedicated imaging for facial bones without any signs of fractures - PT eval pending    Blood loss anemia - Hemoglobin down to 6.7 - Review his hemoglobin in September was 12.7, although baseline seems to be 10-11 - She was transfused 1 unit of PRBC on admission -Follow-up hemoglobin 7.8 - Continue to monitor   Essential hypertension - Holding losartan in the setting of soft pressures and blood loss anemia   DVT prophylaxis: SCDs Start: 02/06/22 2240  Code Status: Full code Family Communication: Discussed with patient Disposition Plan: Status is: Inpatient Remains inpatient appropriate because: Continue to monitor hemoglobin for stability.  Needs PT eval.     Consultants:  GI  Procedures:  EGD  Antimicrobials:      Subjective: Patient is  very hard of hearing.  She denies any complaints at this time.  Objective: Vitals:   02/07/22 1032 02/07/22 1045 02/07/22 1110 02/07/22 1335  BP: (!) 121/56 (!) 116/51 (!) 126/55 (!) 126/49  Pulse: 84 (!) 134 84 79  Resp: '18 14  15  '$ Temp: 98.3 F (36.8 C)  98.7 F (37.1 C) 98.6 F (37 C)  TempSrc:      SpO2: 94% 94%  (!) 89%  Weight:      Height:        Intake/Output Summary (Last 24 hours) at 02/07/2022 1757 Last data filed at 02/07/2022 1339 Gross per 24 hour  Intake 1557.56 ml  Output 100 ml  Net 1457.56 ml   Filed Weights   02/06/22 1538  Weight: 67 kg    Examination:  General exam: Appears calm and comfortable  Respiratory system: Clear to auscultation. Respiratory effort normal. Cardiovascular system: S1 & S2 heard, RRR. No JVD, murmurs, rubs, gallops or clicks. No pedal edema. Gastrointestinal system: Abdomen is nondistended, soft and nontender. No organomegaly or masses felt. Normal bowel sounds heard. Central nervous system: Alert and oriented. No focal neurological deficits. Extremities: Symmetric 5 x 5 power. Skin: No rashes, lesions or ulcers Psychiatry: Judgement and insight appear normal. Mood & affect appropriate.     Data Reviewed: I have personally reviewed following labs and imaging studies  CBC: Recent Labs  Lab 02/06/22 1620 02/06/22 2310 02/07/22 0438 02/07/22 1116  WBC 7.8 8.8 8.9 9.6  NEUTROABS  --  6.0 5.7 6.9  HGB 6.7* 7.8* 7.8* 7.4*  HCT 20.9* 23.7* 24.0* 23.0*  MCV 94.1 93.3 93.4 94.7  PLT 309 244 249 053   Basic Metabolic Panel: Recent Labs  Lab 02/06/22 1620 02/07/22 0438  NA 127* 131*  K 4.6 4.3  CL 95* 102  CO2 21* 22  GLUCOSE 142* 91  BUN 46* 39*  CREATININE 1.06* 1.01*  CALCIUM 8.3* 8.2*  MG  --  2.1   GFR: Estimated Creatinine Clearance: 41 mL/min (A) (by C-G formula based on SCr of 1.01 mg/dL (H)). Liver Function Tests: Recent Labs  Lab 02/06/22 1620 02/07/22 0438  AST 15 14*  ALT 10 8  ALKPHOS 43  37*  BILITOT 0.2* 0.5  PROT 6.1* 5.7*  ALBUMIN 3.1* 2.9*   Recent Labs  Lab 02/06/22 1620  LIPASE 22   Recent Labs  Lab 02/06/22 1710  AMMONIA 13   Coagulation Profile: No results for input(s): "INR", "PROTIME" in the last 168 hours. Cardiac Enzymes: No results for input(s): "CKTOTAL", "CKMB", "CKMBINDEX", "TROPONINI" in the last 168 hours. BNP (last 3 results) No results for input(s): "PROBNP" in the last 8760 hours. HbA1C: No results for input(s): "HGBA1C" in the last 72 hours. CBG: Recent Labs  Lab 02/06/22 1554  GLUCAP 120*   Lipid Profile: No results for input(s): "CHOL", "HDL", "LDLCALC", "TRIG", "CHOLHDL", "LDLDIRECT" in the last 72 hours. Thyroid Function Tests: No results for input(s): "TSH", "T4TOTAL", "FREET4", "T3FREE", "THYROIDAB" in the last 72 hours. Anemia Panel: No results for input(s): "VITAMINB12", "FOLATE", "FERRITIN", "TIBC", "IRON", "RETICCTPCT" in the last 72 hours. Sepsis Labs: No results for input(s): "PROCALCITON", "LATICACIDVEN" in the last 168 hours.  No results found for this or any previous visit (from the past 240 hour(s)).       Radiology Studies: DG Facial Bones 1-2 Views  Result Date: 02/06/2022 CLINICAL DATA:  Status post fall. EXAM: FACIAL BONES - 1-2 VIEW COMPARISON:  None Available. FINDINGS: There is no evidence of an acute fracture or other significant bone abnormality. No orbital emphysema or sinus air-fluid levels are seen. IMPRESSION: No acute osseous abnormality. Electronically Signed   By: Virgina Norfolk M.D.   On: 02/06/2022 22:26   DG Chest 2 View  Result Date: 02/06/2022 CLINICAL DATA:  Fall. EXAM: CHEST - 2 VIEW COMPARISON:  12/07/2021 FINDINGS: Low volume film. The cardio pericardial silhouette is enlarged. Atelectasis or infiltrate noted at the right base with small right pleural effusion. Left lung clear. Telemetry leads overlie the chest. IMPRESSION: Low volume film with right base atelectasis or infiltrate  and small right pleural effusion. Electronically Signed   By: Misty Stanley M.D.   On: 02/06/2022 17:09   DG Humerus Right  Result Date: 02/06/2022 CLINICAL DATA:  Fall. EXAM: RIGHT HUMERUS - 2+ VIEW COMPARISON:  None Available. FINDINGS: There is no evidence of fracture or other focal bone lesions. Soft tissues are unremarkable. IMPRESSION: Negative. Electronically Signed   By: Marijo Conception M.D.   On: 02/06/2022 16:59   CT Head Wo Contrast  Result Date: 02/06/2022 CLINICAL DATA:  Mental status changes.  Syncope. EXAM: CT HEAD WITHOUT CONTRAST TECHNIQUE: Contiguous axial images were obtained from the base of the skull through the vertex without intravenous contrast. RADIATION DOSE REDUCTION: This exam was performed according to the departmental dose-optimization program which includes automated exposure control, adjustment of the mA and/or kV according to patient size and/or use of iterative reconstruction technique. COMPARISON:  MR head 12/08/2021, CT head 12/07/2021 FINDINGS: Brain: No evidence of acute  infarction, hemorrhage, hydrocephalus, extra-axial collection or mass lesion/mass effect. Vascular: There is minimal atherosclerotic calcification of the internal carotid arteries. No hyperdense vessels. Skull: Normal. Negative for fracture or focal lesion. Sinuses/Orbits: There is minimal mucosal thickening of the paranasal sinuses. Mastoid air cells are normally aerated. Other: None IMPRESSION: 1. No evidence of acute intracranial abnormality. 2. Minimal paranasal chronic sinus changes. Electronically Signed   By: Nolon Nations M.D.   On: 02/06/2022 16:44        Scheduled Meds:  ARIPiprazole  2 mg Oral Daily   DULoxetine  120 mg Oral Daily   folic acid  1 mg Oral Daily   LORazepam  0-4 mg Intravenous Q6H   Followed by   Derrill Memo ON 02/08/2022] LORazepam  0-4 mg Intravenous Q12H   multivitamin with minerals  1 tablet Oral Daily   thiamine  100 mg Oral Daily   Or   thiamine  100 mg  Intravenous Daily   zolpidem  5 mg Oral QHS   Continuous Infusions:  pantoprazole 8 mg/hr (02/07/22 1639)     LOS: 1 day    Time spent: 35 mins    Kathie Dike, MD [facility]   If 7PM-7AM, please contact night-coverage [email or web address]  02/07/2022, 5:57 PM

## 2022-02-08 DIAGNOSIS — D500 Iron deficiency anemia secondary to blood loss (chronic): Secondary | ICD-10-CM | POA: Diagnosis not present

## 2022-02-08 DIAGNOSIS — F339 Major depressive disorder, recurrent, unspecified: Secondary | ICD-10-CM | POA: Diagnosis not present

## 2022-02-08 DIAGNOSIS — I10 Essential (primary) hypertension: Secondary | ICD-10-CM | POA: Diagnosis not present

## 2022-02-08 DIAGNOSIS — K921 Melena: Secondary | ICD-10-CM | POA: Diagnosis not present

## 2022-02-08 LAB — CBC
HCT: 19.4 % — ABNORMAL LOW (ref 36.0–46.0)
HCT: 24.3 % — ABNORMAL LOW (ref 36.0–46.0)
Hemoglobin: 6.3 g/dL — CL (ref 12.0–15.0)
Hemoglobin: 7.7 g/dL — ABNORMAL LOW (ref 12.0–15.0)
MCH: 30.7 pg (ref 26.0–34.0)
MCH: 29.6 pg (ref 26.0–34.0)
MCHC: 32.5 g/dL (ref 30.0–36.0)
MCHC: 31.7 g/dL (ref 30.0–36.0)
MCV: 94.6 fL (ref 80.0–100.0)
MCV: 93.5 fL (ref 80.0–100.0)
Platelets: 220 10*3/uL (ref 150–400)
Platelets: 218 10*3/uL (ref 150–400)
RBC: 2.05 MIL/uL — ABNORMAL LOW (ref 3.87–5.11)
RBC: 2.6 MIL/uL — ABNORMAL LOW (ref 3.87–5.11)
RDW: 13.8 % (ref 11.5–15.5)
RDW: 15.4 % (ref 11.5–15.5)
WBC: 7.9 10*3/uL (ref 4.0–10.5)
WBC: 9 10*3/uL (ref 4.0–10.5)
nRBC: 0 % (ref 0.0–0.2)
nRBC: 0 % (ref 0.0–0.2)

## 2022-02-08 LAB — BASIC METABOLIC PANEL
Anion gap: 5 (ref 5–15)
BUN: 22 mg/dL (ref 8–23)
CO2: 22 mmol/L (ref 22–32)
Calcium: 7.7 mg/dL — ABNORMAL LOW (ref 8.9–10.3)
Chloride: 103 mmol/L (ref 98–111)
Creatinine, Ser: 1.06 mg/dL — ABNORMAL HIGH (ref 0.44–1.00)
GFR, Estimated: 52 mL/min — ABNORMAL LOW (ref >60)
Glucose, Bld: 97 mg/dL (ref 70–99)
Potassium: 4.3 mmol/L (ref 3.5–5.1)
Sodium: 130 mmol/L — ABNORMAL LOW (ref 135–145)

## 2022-02-08 LAB — PREPARE RBC (CROSSMATCH)

## 2022-02-08 MED ORDER — SODIUM CHLORIDE 0.9% IV SOLUTION: Freq: Once | INTRAVENOUS | Status: AC

## 2022-02-08 NOTE — Progress Notes (Signed)
PROGRESS NOTE    Marilyn Haas  INO:[id number] DOB: 02-27-1941 DOA: 02/06/2022 PCP: Redmond School, MD    Brief Narrative:  81 year old female with a history of GERD, alcohol use, depression, presents to the hospital after having a fall.  She reported becoming dizzy and falling.  She also reports having shortness of breath on exertion.  She admits to noticing hematochezia and melena which have reportedly been present for months.  Hemoglobin on admission noted to be 6.7.  FOBT positive.  Admitted for further GI evaluation and transfusion of PRBC.   Assessment & Plan:   Principal Problem:   GI bleed Active Problems:   Essential hypertension   Blood loss anemia   Fall at home, initial encounter   Depression   Elevated BUN   GI bleed - Noted to have melena and hematochezia on admission -Seen by GI and underwent EGD which showed hiatal hernia and Cameron ulcers. - Recommended patient to continue on PPI and Carafate - Monitor CBC    Depression - Continue Abilify and Cymbalta - Continue to monitor   Fall at home, initial encounter - With bruising over bridge of nose - No intracranial abnormality on CT head - Dedicated imaging for facial bones without any signs of fractures - PT eval pending    Blood loss anemia - Hemoglobin down to 6.7 on admission - Review his hemoglobin in September was 12.7, although baseline seems to be 10-11 - She was transfused 1 unit of PRBC on admission -Follow-up hemoglobin 7.8, but trended back down to 6.3 this morning after receiving continuous IV fluids -Second unit of PRBC transfused on 11/26, follow-up hemoglobin pending -Transfuse to keep hemoglobin greater than 7 - Continue to monitor   Essential hypertension - Holding losartan in the setting of soft pressures and blood loss anemia   DVT prophylaxis: SCDs Start: 02/06/22 2240  Code Status: Full code Family Communication: Discussed with patient Disposition Plan: Status is:  Inpatient Remains inpatient appropriate because: Continue to monitor hemoglobin for stability.  Needs PT eval.     Consultants:  GI  Procedures:  EGD  Antimicrobials:      Subjective: She says she is doing well.  She denies any abdominal pain.  Reports that she had a bowel movement earlier today.  She does not remember if it contained any dark blood/fresh blood  Objective: Vitals:   02/07/22 1032 02/07/22 1045 02/07/22 1110 02/07/22 1335  BP: (!) 121/56 (!) 116/51 (!) 126/55 (!) 126/49  Pulse: 84 (!) 134 84 79  Resp: '18 14  15  '$ Temp: 98.3 F (36.8 C)  98.7 F (37.1 C) 98.6 F (37 C)  TempSrc:      SpO2: 94% 94%  (!) 89%  Weight:      Height:        Intake/Output Summary (Last 24 hours) at 02/07/2022 1757 Last data filed at 02/07/2022 1339 Gross per 24 hour  Intake 1557.56 ml  Output 100 ml  Net 1457.56 ml   Filed Weights   02/06/22 1538  Weight: 67 kg    Examination:  General exam: Appears calm and comfortable  Respiratory system: Clear to auscultation. Respiratory effort normal. Cardiovascular system: S1 & S2 heard, RRR. No JVD, murmurs, rubs, gallops or clicks. No pedal edema. Gastrointestinal system: Abdomen is nondistended, soft and nontender. No organomegaly or masses felt. Normal bowel sounds heard. Central nervous system: Alert and oriented. No focal neurological deficits. Extremities: Symmetric 5 x 5 power. Skin: No rashes, lesions or ulcers Psychiatry:  Judgement and insight appear normal. Mood & affect appropriate.     Data Reviewed: I have personally reviewed following labs and imaging studies  CBC: Recent Labs  Lab 02/06/22 1620 02/06/22 2310 02/07/22 0438 02/07/22 1116  WBC 7.8 8.8 8.9 9.6  NEUTROABS  --  6.0 5.7 6.9  HGB 6.7* 7.8* 7.8* 7.4*  HCT 20.9* 23.7* 24.0* 23.0*  MCV 94.1 93.3 93.4 94.7  PLT 309 244 249 789   Basic Metabolic Panel: Recent Labs  Lab 02/06/22 1620 02/07/22 0438  NA 127* 131*  K 4.6 4.3  CL 95* 102   CO2 21* 22  GLUCOSE 142* 91  BUN 46* 39*  CREATININE 1.06* 1.01*  CALCIUM 8.3* 8.2*  MG  --  2.1   GFR: Estimated Creatinine Clearance: 41 mL/min (A) (by C-G formula based on SCr of 1.01 mg/dL (H)). Liver Function Tests: Recent Labs  Lab 02/06/22 1620 02/07/22 0438  AST 15 14*  ALT 10 8  ALKPHOS 43 37*  BILITOT 0.2* 0.5  PROT 6.1* 5.7*  ALBUMIN 3.1* 2.9*   Recent Labs  Lab 02/06/22 1620  LIPASE 22   Recent Labs  Lab 02/06/22 1710  AMMONIA 13   Coagulation Profile: No results for input(s): "INR", "PROTIME" in the last 168 hours. Cardiac Enzymes: No results for input(s): "CKTOTAL", "CKMB", "CKMBINDEX", "TROPONINI" in the last 168 hours. BNP (last 3 results) No results for input(s): "PROBNP" in the last 8760 hours. HbA1C: No results for input(s): "HGBA1C" in the last 72 hours. CBG: Recent Labs  Lab 02/06/22 1554  GLUCAP 120*   Lipid Profile: No results for input(s): "CHOL", "HDL", "LDLCALC", "TRIG", "CHOLHDL", "LDLDIRECT" in the last 72 hours. Thyroid Function Tests: No results for input(s): "TSH", "T4TOTAL", "FREET4", "T3FREE", "THYROIDAB" in the last 72 hours. Anemia Panel: No results for input(s): "VITAMINB12", "FOLATE", "FERRITIN", "TIBC", "IRON", "RETICCTPCT" in the last 72 hours. Sepsis Labs: No results for input(s): "PROCALCITON", "LATICACIDVEN" in the last 168 hours.  No results found for this or any previous visit (from the past 240 hour(s)).       Radiology Studies: DG Facial Bones 1-2 Views  Result Date: 02/06/2022 CLINICAL DATA:  Status post fall. EXAM: FACIAL BONES - 1-2 VIEW COMPARISON:  None Available. FINDINGS: There is no evidence of an acute fracture or other significant bone abnormality. No orbital emphysema or sinus air-fluid levels are seen. IMPRESSION: No acute osseous abnormality. Electronically Signed   By: Virgina Norfolk M.D.   On: 02/06/2022 22:26   DG Chest 2 View  Result Date: 02/06/2022 CLINICAL DATA:  Fall. EXAM:  CHEST - 2 VIEW COMPARISON:  12/07/2021 FINDINGS: Low volume film. The cardio pericardial silhouette is enlarged. Atelectasis or infiltrate noted at the right base with small right pleural effusion. Left lung clear. Telemetry leads overlie the chest. IMPRESSION: Low volume film with right base atelectasis or infiltrate and small right pleural effusion. Electronically Signed   By: Misty Stanley M.D.   On: 02/06/2022 17:09   DG Humerus Right  Result Date: 02/06/2022 CLINICAL DATA:  Fall. EXAM: RIGHT HUMERUS - 2+ VIEW COMPARISON:  None Available. FINDINGS: There is no evidence of fracture or other focal bone lesions. Soft tissues are unremarkable. IMPRESSION: Negative. Electronically Signed   By: Marijo Conception M.D.   On: 02/06/2022 16:59   CT Head Wo Contrast  Result Date: 02/06/2022 CLINICAL DATA:  Mental status changes.  Syncope. EXAM: CT HEAD WITHOUT CONTRAST TECHNIQUE: Contiguous axial images were obtained from the base of the skull through  the vertex without intravenous contrast. RADIATION DOSE REDUCTION: This exam was performed according to the departmental dose-optimization program which includes automated exposure control, adjustment of the mA and/or kV according to patient size and/or use of iterative reconstruction technique. COMPARISON:  MR head 12/08/2021, CT head 12/07/2021 FINDINGS: Brain: No evidence of acute infarction, hemorrhage, hydrocephalus, extra-axial collection or mass lesion/mass effect. Vascular: There is minimal atherosclerotic calcification of the internal carotid arteries. No hyperdense vessels. Skull: Normal. Negative for fracture or focal lesion. Sinuses/Orbits: There is minimal mucosal thickening of the paranasal sinuses. Mastoid air cells are normally aerated. Other: None IMPRESSION: 1. No evidence of acute intracranial abnormality. 2. Minimal paranasal chronic sinus changes. Electronically Signed   By: Nolon Nations M.D.   On: 02/06/2022 16:44        Scheduled  Meds:  ARIPiprazole  2 mg Oral Daily   DULoxetine  120 mg Oral Daily   folic acid  1 mg Oral Daily   LORazepam  0-4 mg Intravenous Q6H   Followed by   Derrill Memo ON 02/08/2022] LORazepam  0-4 mg Intravenous Q12H   multivitamin with minerals  1 tablet Oral Daily   thiamine  100 mg Oral Daily   Or   thiamine  100 mg Intravenous Daily   zolpidem  5 mg Oral QHS   Continuous Infusions:  pantoprazole 8 mg/hr (02/07/22 1639)     LOS: 1 day    Time spent: 35 mins    Kathie Dike, MD [facility]   If 7PM-7AM, please contact night-coverage [email or web address]  02/07/2022, 5:57 PM

## 2022-02-09 DIAGNOSIS — D500 Iron deficiency anemia secondary to blood loss (chronic): Secondary | ICD-10-CM | POA: Diagnosis not present

## 2022-02-09 DIAGNOSIS — F339 Major depressive disorder, recurrent, unspecified: Secondary | ICD-10-CM | POA: Diagnosis not present

## 2022-02-09 DIAGNOSIS — I10 Essential (primary) hypertension: Secondary | ICD-10-CM | POA: Diagnosis not present

## 2022-02-09 DIAGNOSIS — K921 Melena: Secondary | ICD-10-CM | POA: Diagnosis not present

## 2022-02-09 LAB — CBC
HCT: 21.5 % — ABNORMAL LOW (ref 36.0–46.0)
HCT: 27.6 % — ABNORMAL LOW (ref 36.0–46.0)
Hemoglobin: 7.1 g/dL — ABNORMAL LOW (ref 12.0–15.0)
Hemoglobin: 9.3 g/dL — ABNORMAL LOW (ref 12.0–15.0)
MCH: 30.2 pg (ref 26.0–34.0)
MCH: 30.6 pg (ref 26.0–34.0)
MCHC: 33 g/dL (ref 30.0–36.0)
MCHC: 33.7 g/dL (ref 30.0–36.0)
MCV: 91.5 fL (ref 80.0–100.0)
MCV: 90.8 fL (ref 80.0–100.0)
Platelets: 194 10*3/uL (ref 150–400)
Platelets: 194 10*3/uL (ref 150–400)
RBC: 2.35 MIL/uL — ABNORMAL LOW (ref 3.87–5.11)
RBC: 3.04 MIL/uL — ABNORMAL LOW (ref 3.87–5.11)
RDW: 15.2 % (ref 11.5–15.5)
RDW: 15 % (ref 11.5–15.5)
WBC: 6.8 10*3/uL (ref 4.0–10.5)
WBC: 8.3 10*3/uL (ref 4.0–10.5)
nRBC: 0 % (ref 0.0–0.2)
nRBC: 0 % (ref 0.0–0.2)

## 2022-02-09 LAB — IRON AND TIBC
Iron: 20 ug/dL — ABNORMAL LOW (ref 28–170)
Saturation Ratios: 6 % — ABNORMAL LOW (ref 10.4–31.8)
TIBC: 308 ug/dL (ref 250–450)
UIBC: 288 ug/dL

## 2022-02-09 LAB — PREPARE RBC (CROSSMATCH)

## 2022-02-09 LAB — FOLATE: Folate: 12.2 ng/mL (ref >5.9)

## 2022-02-09 LAB — VITAMIN B12: Vitamin B-12: 111 pg/mL — ABNORMAL LOW (ref 180–914)

## 2022-02-09 LAB — FERRITIN: Ferritin: 14 ng/mL (ref 11–307)

## 2022-02-09 MED ORDER — SODIUM CHLORIDE 0.9% IV SOLUTION: Freq: Once | INTRAVENOUS | Status: AC

## 2022-02-09 MED ORDER — VITAMIN B-12 1000 MCG PO TABS
1000.0000 ug | ORAL_TABLET | Freq: Every day | ORAL | Status: DC
  Administered 2022-02-09 – 2022-02-11 (×3): 1000 ug via ORAL
  Filled 2022-02-09 (×3): qty 1

## 2022-02-09 MED ORDER — SODIUM CHLORIDE 0.9 % IV SOLN
250.0000 mg | Freq: Every day | INTRAVENOUS | Status: AC
  Administered 2022-02-09 – 2022-02-10 (×2): 250 mg via INTRAVENOUS
  Filled 2022-02-09 (×2): qty 250

## 2022-02-09 MED ORDER — CYANOCOBALAMIN 1000 MCG/ML IJ SOLN
1000.0000 ug | Freq: Once | INTRAMUSCULAR | Status: AC
  Administered 2022-02-09: 1000 ug via INTRAMUSCULAR
  Filled 2022-02-09: qty 1

## 2022-02-09 NOTE — Plan of Care (Signed)
  Problem: Acute Rehab PT Goals(only PT should resolve) Goal: Pt Will Go Supine/Side To Sit Outcome: Progressing Flowsheets (Taken 02/09/2022 1201) Pt will go Supine/Side to Sit: with min guard assist Goal: Patient Will Transfer Sit To/From Stand Outcome: Progressing Flowsheets (Taken 02/09/2022 1201) Patient will transfer sit to/from stand: with supervision Goal: Pt Will Transfer Bed To Chair/Chair To Bed Outcome: Progressing Flowsheets (Taken 02/09/2022 1201) Pt will Transfer Bed to Chair/Chair to Bed: with supervision Goal: Pt Will Ambulate Outcome: Progressing Flowsheets (Taken 02/09/2022 1201) Pt will Ambulate:  with supervision  50 feet  with rolling walker   Zigmund Gottron, SPT

## 2022-02-09 NOTE — TOC Initial Note (Addendum)
Transition of Care [facility]) - Initial/Assessment Note    Patient Details  Name: Marilyn Haas MRN: [id number] Date of Birth: 1940-12-14  [facility]) CM/SW Contact:    Ihor Gully, LCSW Phone Number: 02/09/2022, 4:39 PM  Clinical Narrative:                 Patient from home alone. Needs assistance. Had HHPT with [facility] in Sept. Son feels she need more intensive assistance in PT to get back to baseline and requests SNF.  Discussed SNF options. Referred to facilities of request. Patient is vaccinated for COVID.  Expected Discharge Plan: Skilled Nursing Facility Barriers to Discharge: Continued Medical Work up   Patient Goals and CMS Choice Patient states their goals for this hospitalization and ongoing recovery are:: rehab then home      Expected Discharge Plan and Services Expected Discharge Plan: [place]       Living arrangements for the past 2 months: [place]                                      Prior Living Arrangements/Services Living arrangements for the past 2 months: Single Family Home Lives with:: Spouse Patient language and need for interpreter reviewed:: Yes        Need for Family Participation in Patient Care: Yes (Comment) Care giver support system in place?: Yes (comment)   Criminal Activity/Legal Involvement Pertinent to Current Situation/Hospitalization: No - Comment as needed  Activities of Daily Living Home Assistive Devices/Equipment: Dentures (specify type), Walker (specify type), Grab bars in shower, Eyeglasses, Shower chair with back ADL Screening (condition at time of admission) Patient's cognitive ability adequate to safely complete daily activities?: Yes Is the patient deaf or have difficulty hearing?: No (patient has hearing aids) Does the patient have difficulty seeing, even when wearing glasses/contacts?: No Does the patient have difficulty concentrating, remembering, or making  decisions?: No Patient able to express need for assistance with ADLs?: Yes Does the patient have difficulty dressing or bathing?: Yes Independently performs ADLs?: No Communication: Independent Dressing (OT): Needs assistance Is this a change from baseline?: Pre-admission baseline Grooming: Needs assistance Is this a change from baseline?: Pre-admission baseline Feeding: Independent Bathing: Needs assistance Is this a change from baseline?: Pre-admission baseline Toileting: Needs assistance Is this a change from baseline?: Pre-admission baseline In/Out Bed: Needs assistance Is this a change from baseline?: Pre-admission baseline Does the patient have difficulty walking or climbing stairs?: Yes Weakness of Legs: Both Weakness of Arms/Hands: Both  Permission Sought/Granted Permission sought to share information with : Family Supports    Share Information with NAME: son, Lynelle Smoke           Emotional Assessment     Affect (typically observed): Appropriate Orientation: : Oriented to Self, Oriented to Place, Oriented to  Time, Oriented to Situation Alcohol / Substance Use: Not Applicable    Admission diagnosis:  GI bleed [K92.2] Patient Active Problem List   Diagnosis Date Noted   GI bleed 02/06/2022   Elevated BUN 02/06/2022   GERD (gastroesophageal reflux disease) 07/05/2021   Depression 07/05/2021   Fall at home, initial encounter 07/04/2021   MDD (major depressive disorder), recurrent episode, moderate ([place]) 06/13/2018   Thickened endometrium 09/29/2017   LLQ fullness 09/23/2017   History of breast cancer 09/23/2017   Screening for colorectal cancer 09/23/2017   Routine cervical smear 09/23/2017  Encounter for gynecological examination with Papanicolaou smear of cervix 09/23/2017   Dysphagia 09/01/2017   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 ([place]) 08/13/2016   Malignant neoplasm of upper-outer quadrant of right breast in female, estrogen receptor  positive ([place]) 06/22/2016   Osteopenia determined by x-ray 06/22/2016   Chronic hyponatremia 10/12/2015   Syncope 10/11/2015   Acute pancreatitis 10/03/2015   Other fatigue 12/16/2014   PVC's (premature ventricular contractions) 12/16/2014   Pain with hip hemiarthroplasty ([place]) 07/04/2014   Left hip pain 01/31/2013   Blood loss anemia 08/09/2012   B12 deficiency anemia 08/09/2012   Vitamin D deficiency 08/09/2012   Anxiety 08/09/2012   Palpitations 08/09/2012   Hyperlipidemia 05/25/2011   Essential hypertension 05/25/2011   PCP:  Redmond School, MD Pharmacy:   [facility] [place] [address] Phone: [phone] Fax: [phone]     Social Determinants of Health (SDOH) Interventions    Readmission Risk Interventions     No data to display

## 2022-02-09 NOTE — [place] FL2 (Signed)
[place] MEDICAID FL2 LEVEL OF CARE SCREENING TOOL     IDENTIFICATION  Patient Name: Marilyn Haas Birthdate: Mar 19, 1940 Sex: female Admission Date (Current Location): 02/06/2022  [facility] and [name] Number:  [employer] and Address:         Provider Number: [id number]  Attending Physician Name and Address:  Erick Blinks, MD  Relative Name and Phone Number:  Neysa, Barret  [phone]    Current Level of Care: Hospital Recommended Level of Care: Skilled Nursing Facility Prior Approval Number:    Date Approved/Denied:   PASRR Number: [id number]  Discharge Plan: SNF    Current Diagnoses: Patient Active Problem List   Diagnosis Date Noted   GI bleed 02/06/2022   Elevated BUN 02/06/2022   GERD (gastroesophageal reflux disease) 07/05/2021   Depression 07/05/2021   Fall at home, initial encounter 07/04/2021   MDD (major depressive disorder), recurrent episode, moderate (HCC) 06/13/2018   Thickened endometrium 09/29/2017   LLQ fullness 09/23/2017   History of breast cancer 09/23/2017   Screening for colorectal cancer 09/23/2017   Routine cervical smear 09/23/2017   Encounter for gynecological examination with Papanicolaou smear of cervix 09/23/2017   Dysphagia 09/01/2017   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 (HCC) 08/13/2016   Malignant neoplasm of upper-outer quadrant of right breast in female, estrogen receptor positive (HCC) 06/22/2016   Osteopenia determined by x-ray 06/22/2016   Chronic hyponatremia 10/12/2015   Syncope 10/11/2015   Acute pancreatitis 10/03/2015   Other fatigue 12/16/2014   PVC's (premature ventricular contractions) 12/16/2014   Pain with hip hemiarthroplasty (HCC) 07/04/2014   Left hip pain 01/31/2013   Blood loss anemia 08/09/2012   B12 deficiency anemia 08/09/2012   Vitamin D deficiency 08/09/2012   Anxiety 08/09/2012   Palpitations 08/09/2012   Hyperlipidemia 05/25/2011   Essential  hypertension 05/25/2011    Orientation RESPIRATION BLADDER Height & Weight     Self, Time, Situation, Place  Normal Continent Weight: 147 lb 11.3 oz (67 kg) Height:  5\' 7"  (170.2 cm)  BEHAVIORAL SYMPTOMS/MOOD NEUROLOGICAL BOWEL NUTRITION STATUS      Continent Diet (soft)  AMBULATORY STATUS COMMUNICATION OF NEEDS Skin   Limited Assist Verbally Normal                       Personal Care Assistance Level of Assistance  Bathing, Feeding, Dressing Bathing Assistance: Limited assistance Feeding assistance: Independent Dressing Assistance: Limited assistance     Functional Limitations Info  Sight, Hearing, Speech Sight Info: Adequate Hearing Info: Adequate Speech Info: Adequate    SPECIAL CARE FACTORS FREQUENCY  PT (By licensed PT)     PT Frequency: 5x/week              Contractures Contractures Info: Not present    Additional Factors Info  Code Status, Allergies, Psychotropic Code Status Info: Full Allergies Info: Avelox Psychotropic Info: Xanax, Abilify         Current Medications (02/09/2022):  This is the current hospital active medication list Current Facility-Administered Medications  Medication Dose Route Frequency Provider Last Rate Last Admin   acetaminophen (TYLENOL) tablet 650 mg  650 mg Oral Q6H PRN Zierle-Ghosh, Asia B, DO       Or   acetaminophen (TYLENOL) suppository 650 mg  650 mg Rectal Q6H PRN Zierle-Ghosh, Asia B, DO       ARIPiprazole (ABILIFY) tablet 2 mg  2 mg Oral Daily Zierle-Ghosh, Asia B, DO   2 mg  at 02/09/22 0925   cyanocobalamin (VITAMIN B12) tablet 1,000 mcg  1,000 mcg Oral Daily Erick Blinks, MD   1,000 mcg at 02/09/22 1426   DULoxetine (CYMBALTA) DR capsule 120 mg  120 mg Oral Daily Zierle-Ghosh, Asia B, DO   120 mg at 02/09/22 4696   ferric gluconate (FERRLECIT) 250 mg in sodium chloride 0.9 % 250 mL IVPB  250 mg Intravenous Daily Erick Blinks, MD 135 mL/hr at 02/09/22 1426 250 mg at 02/09/22 1426   folic acid (FOLVITE)  tablet 1 mg  1 mg Oral Daily Zierle-Ghosh, Asia B, DO   1 mg at 02/09/22 0925   LORazepam (ATIVAN) tablet 1-4 mg  1-4 mg Oral Q1H PRN Zierle-Ghosh, Asia B, DO       Or   LORazepam (ATIVAN) injection 1-4 mg  1-4 mg Intravenous Q1H PRN Zierle-Ghosh, Asia B, DO       morphine (PF) 2 MG/ML injection 2 mg  2 mg Intravenous Q2H PRN Zierle-Ghosh, Asia B, DO       multivitamin with minerals tablet 1 tablet  1 tablet Oral Daily Zierle-Ghosh, Asia B, DO   1 tablet at 02/09/22 0925   ondansetron (ZOFRAN) tablet 4 mg  4 mg Oral Q6H PRN Zierle-Ghosh, Asia B, DO       Or   ondansetron (ZOFRAN) injection 4 mg  4 mg Intravenous Q6H PRN Zierle-Ghosh, Asia B, DO       oxyCODONE (Oxy IR/ROXICODONE) immediate release tablet 5 mg  5 mg Oral Q4H PRN Zierle-Ghosh, Asia B, DO       pantoprazole (PROTONIX) EC tablet 40 mg  40 mg Oral BID Erick Blinks, MD   40 mg at 02/09/22 0925   sucralfate (CARAFATE) 1 GM/10ML suspension 1 g  1 g Oral BID Erick Blinks, MD   1 g at 02/09/22 2952   thiamine (VITAMIN B1) tablet 100 mg  100 mg Oral Daily Zierle-Ghosh, Asia B, DO   100 mg at 02/09/22 8413   Or   thiamine (VITAMIN B1) injection 100 mg  100 mg Intravenous Daily Zierle-Ghosh, Asia B, DO       zolpidem (AMBIEN) tablet 5 mg  5 mg Oral QHS Zierle-Ghosh, Asia B, DO   5 mg at 02/08/22 2159     Discharge Medications: Please see discharge summary for a list of discharge medications.  Relevant Imaging Results:  Relevant Lab Results:   Additional Information SSN [id number]. Per son patient is vaccinated for COVID  Kaoir Loree, Juleen China, LCSW

## 2022-02-09 NOTE — Evaluation (Addendum)
Physical Therapy Evaluation Patient Details Name: Marilyn Haas MRN: [id number] DOB: [birth year] Today's Date: 02/09/2022  History of Present Illness  Marilyn Haas is a 81 y.o. female with medical history significant of anemia, depression, GERD, hard of hearing, alcohol abuse, hypertension, osteopenia, and more presents to the ED with a chief complaint of fall. Patient reports that she became dizzy and fell this afternoon. She reports she landed on the whole front of her body, and bruising over her nose indicates that is likely true.   Clinical Impression  Patient able to sit up at bedside with min assist for elevating trunk and scooting to EOB. Patient able to ambulate in room with min guard/RW, limited in distance due to receiving blood transfusion. Patient put back to bed after therapy due to low hemoglobin/to continue receiving blood with daughter-in-law in room. Patient will benefit from continued skilled physical therapy in hospital and recommended venue below to increase strength, balance, endurance for safe ADLs and gait.     Recommendations for follow up therapy are one component of a multi-disciplinary discharge planning process, led by the attending physician.  Recommendations may be updated based on patient status, additional functional criteria and insurance authorization.  Follow Up Recommendations Home health PT      Assistance Recommended at Discharge Set up Supervision/Assistance  Patient can return home with the following  A little help with walking and/or transfers;A little help with bathing/dressing/bathroom;Assistance with cooking/housework;Help with stairs or ramp for entrance    Equipment Recommendations None recommended by PT  Recommendations for Other Services       Functional Status Assessment Patient has had a recent decline in their functional status and demonstrates the ability to make significant improvements in function in a reasonable and  predictable amount of time.     Precautions / Restrictions Precautions Precautions: Fall Restrictions Weight Bearing Restrictions: No      Mobility  Bed Mobility Overal bed mobility: Needs Assistance Bed Mobility: Supine to Sit, Sit to Supine     Supine to sit: Min assist Sit to supine: Min guard   General bed mobility comments: min assist to elevate trunk and scoot to EOB, min guard for sit to supine with verbal cueing to position self in bed with good carryover    Transfers Overall transfer level: Needs assistance Equipment used: Rolling walker (2 wheels) Transfers: Sit to/from Stand Sit to Stand: Min guard                Ambulation/Gait Ambulation/Gait assistance: Min guard Gait Distance (Feet): 20 Feet Assistive device: Rolling walker (2 wheels) Gait Pattern/deviations: Decreased step length - right, Decreased step length - left, Decreased stride length Gait velocity: decreased     General Gait Details: patient able to ambulate to room door and back with min guard/RW, limited in distance mostly due to getting blood at the time  Stairs            Wheelchair Mobility    Modified Rankin (Stroke Patients Only)       Balance Overall balance assessment: Needs assistance Sitting-balance support: No upper extremity supported, Feet supported Sitting balance-Leahy Scale: Good Sitting balance - Comments: seated EOB   Standing balance support: Bilateral upper extremity supported, During functional activity, Reliant on assistive device for balance Standing balance-Leahy Scale: Fair Standing balance comment: fair/good with RW  Pertinent Vitals/Pain Pain Assessment Pain Assessment: No/denies pain    Home Living Family/patient expects to be discharged to:: Private residence Living Arrangements: Alone;Children Available Help at Discharge: Family Type of Home: House Home Access: Stairs to enter Entrance  Stairs-Rails: None Entrance Stairs-Number of Steps: 2 Alternate Level Stairs-Number of Steps: Patient states she does not go upstairs Home Layout: Two level;Able to live on main level with bedroom/bathroom;Full bath on main level Home Equipment: Rolling Walker (2 wheels);Rollator (4 wheels);BSC/3in1;Shower seat      Prior Function Prior Level of Function : Needs assist       Physical Assist : Mobility (physical);ADLs (physical) Mobility (physical): Stairs ADLs (physical): IADLs Mobility Comments: Household ambulator with RW, gets assistance for going up steps to get in house ADLs Comments: Assisted by family     Hand Dominance   Dominant Hand: Right    Extremity/Trunk Assessment   Upper Extremity Assessment Upper Extremity Assessment: Generalized weakness    Lower Extremity Assessment Lower Extremity Assessment: Generalized weakness    Cervical / Trunk Assessment Cervical / Trunk Assessment: Kyphotic  Communication   Communication: No difficulties  Cognition Arousal/Alertness: Awake/alert Behavior During Therapy: WFL for tasks assessed/performed Overall Cognitive Status: Within Functional Limits for tasks assessed                                          General Comments      Exercises     Assessment/Plan    PT Assessment Patient needs continued PT services  PT Problem List Decreased strength;Decreased activity tolerance;Decreased balance;Decreased mobility       PT Treatment Interventions DME instruction;Balance training;Gait training;Stair training;Functional mobility training;Patient/family education;Therapeutic activities;Therapeutic exercise    PT Goals (Current goals can be found in the Care Plan section)  Acute Rehab PT Goals Patient Stated Goal: return home with family to assist PT Goal Formulation: With patient/family Time For Goal Achievement: 02/23/22 Potential to Achieve Goals: Good    Frequency Min 3X/week      Co-evaluation               AM-PAC PT "6 Clicks" Mobility  Outcome Measure Help needed turning from your back to your side while in a flat bed without using bedrails?: None Help needed moving from lying on your back to sitting on the side of a flat bed without using bedrails?: A Little Help needed moving to and from a bed to a chair (including a wheelchair)?: A Little Help needed standing up from a chair using your arms (e.g., wheelchair or bedside chair)?: A Little Help needed to walk in hospital room?: A Little Help needed climbing 3-5 steps with a railing? : A Lot 6 Click Score: 18    End of Session   Activity Tolerance: Patient tolerated treatment well Patient left: in bed;with call bell/phone within reach;with family/visitor present Nurse Communication: Mobility status PT Visit Diagnosis: Unsteadiness on feet (R26.81);Other abnormalities of gait and mobility (R26.89);Muscle weakness (generalized) (M62.81)    Time: 1001-1015 PT Time Calculation (min) (ACUTE ONLY): 14 min   Charges:   PT Evaluation $PT Eval Low Complexity: 1 Low PT Treatments $Therapeutic Activity: 8-22 mins        Zigmund Gottron, SPT

## 2022-02-09 NOTE — Progress Notes (Signed)
PROGRESS NOTE    Marilyn Haas  BTD:[id number] DOB: [birth year] DOA: 02/06/2022 PCP: Redmond School, MD    Brief Narrative:  81 year old female with a history of GERD, alcohol use, depression, presents to the hospital after having a fall.  She reported becoming dizzy and falling.  She also reports having shortness of breath on exertion.  She admits to noticing hematochezia and melena which have reportedly been present for months.  Hemoglobin on admission noted to be 6.7.  FOBT positive.  Admitted for further GI evaluation and transfusion of PRBC.   Assessment & Plan:   Principal Problem:   GI bleed Active Problems:   Essential hypertension   Blood loss anemia   Fall at home, initial encounter   Depression   Elevated BUN   GI bleed - Noted to have melena and hematochezia on admission -Seen by GI and underwent EGD which showed hiatal hernia and Cameron ulcers. - Recommended patient to continue on PPI and Carafate - She is still having some dark stools, but do not appear to be dark and tarry as they were on admission.  No hematochezia. -Continue to monitor.    Depression - Continue Abilify and Cymbalta - Continue to monitor   Fall at home, initial encounter - With bruising over bridge of nose - No intracranial abnormality on CT head - Dedicated imaging for facial bones without any signs of fractures - Seen by PT with recommendations for home health PT -Family has concerns about her returning home since she is alone and will not have anybody with her.  Notes indicate that she was able to ambulate 20 feet.  Will ask PT to reassess her since she does not have significant support at home on discharge, to see if she will benefit from a short stay at skilled nursing facility to ensure stability and reduce her risk of fall.    Normocytic anemia Acute blood loss anemia Iron deficiency anemia B12 deficiency anemia -Anemia is multifactorial - Hemoglobin down to 6.7 on  admission - Hemoglobin in September was 12.7, although baseline seems to be 10-11 - She has thus far been transfused 2 units of PRBC -Suspect that her hemoglobin was concentrated on admission and is likely taking longer to stabilize, considering that she was also receiving IV fluids leading to hemodilution -Hemoglobin this morning noted to be 7.1.  Will transfuse 1 more unit of PRBC and monitor for stability -Serum iron 20, ferritin 14, saturation ratio of 6.  Will start on iron infusions -B12 level 111, start replacement therapy -Folate level was in normal range at 12.2 - Continue to monitor   Essential hypertension - Holding losartan in the setting of soft pressures and blood loss anemia   DVT prophylaxis: SCDs Start: 02/06/22 2240  Code Status: Full code Family Communication: Discussed with patient Disposition Plan: Status is: Inpatient Remains inpatient appropriate because: Continue to monitor hemoglobin for stability.  Needs PT eval.     Consultants:  GI  Procedures:  EGD  Antimicrobials:      Subjective: Staff reports that patient had a bowel movement earlier today that was not dark and tarry as it was before.  It was more a darker green.  P.o. intake was poor this morning because she does not like her food.  She wants to have a cheeseburger for lunch.  Objective: Vitals:   02/08/22 2158 02/09/22 0400 02/09/22 0854 02/09/22 0937  BP: (!) 117/57 (!) 101/52 112/66 (!) 112/54  Pulse: 79 74 78 80  Resp: '16 16 18 18  '$ Temp: 98.6 F (37 C) (!) 96.7 F (35.9 C) 98.3 F (36.8 C) 98.7 F (37.1 C)  TempSrc: Oral  Oral Oral  SpO2: 96% 96% 95% 99%  Weight:      Height:        Intake/Output Summary (Last 24 hours) at 02/09/2022 1218 Last data filed at 02/09/2022 0548 Gross per 24 hour  Intake 670.83 ml  Output 50 ml  Net 620.83 ml   Filed Weights   02/06/22 1538  Weight: 67 kg    Examination:  General exam: Appears calm and comfortable  Respiratory system:  Clear to auscultation. Respiratory effort normal. Cardiovascular system: S1 & S2 heard, RRR. No JVD, murmurs, rubs, gallops or clicks. No pedal edema. Gastrointestinal system: Abdomen is nondistended, soft and nontender. No organomegaly or masses felt. Normal bowel sounds heard. Central nervous system: Alert and oriented. No focal neurological deficits. Extremities: Symmetric 5 x 5 power. Skin: No rashes, lesions or ulcers Psychiatry: Judgement and insight appear normal. Mood & affect appropriate.     Data Reviewed: I have personally reviewed following labs and imaging studies  CBC: Recent Labs  Lab 02/06/22 2310 02/07/22 0438 02/07/22 1116 02/08/22 0429 02/08/22 1953 02/09/22 0449  WBC 8.8 8.9 9.6 7.9 9.0 6.8  NEUTROABS 6.0 5.7 6.9  --   --   --   HGB 7.8* 7.8* 7.4* 6.3* 7.7* 7.1*  HCT 23.7* 24.0* 23.0* 19.4* 24.3* 21.5*  MCV 93.3 93.4 94.7 94.6 93.5 91.5  PLT 244 249 242 220 218 833   Basic Metabolic Panel: Recent Labs  Lab 02/06/22 1620 02/07/22 0438 02/08/22 0429  NA 127* 131* 130*  K 4.6 4.3 4.3  CL 95* 102 103  CO2 21* 22 22  GLUCOSE 142* 91 97  BUN 46* 39* 22  CREATININE 1.06* 1.01* 1.06*  CALCIUM 8.3* 8.2* 7.7*  MG  --  2.1  --    GFR: Estimated Creatinine Clearance: 39.1 mL/min (A) (by C-G formula based on SCr of 1.06 mg/dL (H)). Liver Function Tests: Recent Labs  Lab 02/06/22 1620 02/07/22 0438  AST 15 14*  ALT 10 8  ALKPHOS 43 37*  BILITOT 0.2* 0.5  PROT 6.1* 5.7*  ALBUMIN 3.1* 2.9*   Recent Labs  Lab 02/06/22 1620  LIPASE 22   Recent Labs  Lab 02/06/22 1710  AMMONIA 13   Coagulation Profile: No results for input(s): "INR", "PROTIME" in the last 168 hours. Cardiac Enzymes: No results for input(s): "CKTOTAL", "CKMB", "CKMBINDEX", "TROPONINI" in the last 168 hours. BNP (last 3 results) No results for input(s): "PROBNP" in the last 8760 hours. HbA1C: No results for input(s): "HGBA1C" in the last 72 hours. CBG: Recent Labs  Lab  02/06/22 1554  GLUCAP 120*   Lipid Profile: No results for input(s): "CHOL", "HDL", "LDLCALC", "TRIG", "CHOLHDL", "LDLDIRECT" in the last 72 hours. Thyroid Function Tests: No results for input(s): "TSH", "T4TOTAL", "FREET4", "T3FREE", "THYROIDAB" in the last 72 hours. Anemia Panel: Recent Labs    02/09/22 0450  VITAMINB12 111*  FOLATE 12.2  FERRITIN 14  TIBC 308  IRON 20*   Sepsis Labs: No results for input(s): "PROCALCITON", "LATICACIDVEN" in the last 168 hours.  No results found for this or any previous visit (from the past 240 hour(s)).       Radiology Studies: No results found.      Scheduled Meds:  ARIPiprazole  2 mg Oral Daily   vitamin B-12  1,000 mcg Oral Daily   DULoxetine  120 mg Oral Daily   folic acid  1 mg Oral Daily   multivitamin with minerals  1 tablet Oral Daily   pantoprazole  40 mg Oral BID   sucralfate  1 g Oral BID   thiamine  100 mg Oral Daily   Or   thiamine  100 mg Intravenous Daily   zolpidem  5 mg Oral QHS   Continuous Infusions:  ferric gluconate (FERRLECIT) IVPB       LOS: 3 days    Time spent: 35 mins    Kathie Dike, MD [facility]   If 7PM-7AM, please contact night-coverage [email or web address]  02/09/2022, 12:18 PM

## 2022-02-10 DIAGNOSIS — K922 Gastrointestinal hemorrhage, unspecified: Secondary | ICD-10-CM | POA: Diagnosis not present

## 2022-02-10 DIAGNOSIS — D500 Iron deficiency anemia secondary to blood loss (chronic): Secondary | ICD-10-CM | POA: Diagnosis not present

## 2022-02-10 DIAGNOSIS — I10 Essential (primary) hypertension: Secondary | ICD-10-CM | POA: Diagnosis not present

## 2022-02-10 DIAGNOSIS — G894 Chronic pain syndrome: Secondary | ICD-10-CM | POA: Diagnosis not present

## 2022-02-10 DIAGNOSIS — F339 Major depressive disorder, recurrent, unspecified: Secondary | ICD-10-CM | POA: Diagnosis not present

## 2022-02-10 DIAGNOSIS — K921 Melena: Secondary | ICD-10-CM | POA: Diagnosis not present

## 2022-02-10 DIAGNOSIS — M150 Primary generalized (osteo)arthritis: Secondary | ICD-10-CM | POA: Diagnosis not present

## 2022-02-10 LAB — CBC
HCT: 25.7 % — ABNORMAL LOW (ref 36.0–46.0)
Hemoglobin: 8.6 g/dL — ABNORMAL LOW (ref 12.0–15.0)
MCH: 30.5 pg (ref 26.0–34.0)
MCHC: 33.5 g/dL (ref 30.0–36.0)
MCV: 91.1 fL (ref 80.0–100.0)
Platelets: 203 10*3/uL (ref 150–400)
RBC: 2.82 MIL/uL — ABNORMAL LOW (ref 3.87–5.11)
RDW: 14.9 % (ref 11.5–15.5)
WBC: 8.2 10*3/uL (ref 4.0–10.5)
nRBC: 0 % (ref 0.0–0.2)

## 2022-02-10 LAB — BPAM RBC
Blood Product Expiration Date: 202312022359
Blood Product Expiration Date: 202312262359
Blood Product Expiration Date: 202312262359
Blood Product Expiration Date: 202312282359
ISSUE DATE / TIME: 202311241927
ISSUE DATE / TIME: 202311261144
ISSUE DATE / TIME: 202311270913
Unit Type and Rh: 5100
Unit Type and Rh: 5100
Unit Type and Rh: 5100
Unit Type and Rh: 5100

## 2022-02-10 LAB — TYPE AND SCREEN
ABO/RH(D): O POS
Antibody Screen: NEGATIVE
Unit division: 0
Unit division: 0
Unit division: 0
Unit division: 0

## 2022-02-10 LAB — BASIC METABOLIC PANEL
Anion gap: 2 — ABNORMAL LOW (ref 5–15)
BUN: 11 mg/dL (ref 8–23)
CO2: 23 mmol/L (ref 22–32)
Calcium: 7.5 mg/dL — ABNORMAL LOW (ref 8.9–10.3)
Chloride: 101 mmol/L (ref 98–111)
Creatinine, Ser: 0.97 mg/dL (ref 0.44–1.00)
GFR, Estimated: 58 mL/min — ABNORMAL LOW (ref >60)
Glucose, Bld: 100 mg/dL — ABNORMAL HIGH (ref 70–99)
Potassium: 3.7 mmol/L (ref 3.5–5.1)
Sodium: 126 mmol/L — ABNORMAL LOW (ref 135–145)

## 2022-02-10 LAB — SURGICAL PATHOLOGY

## 2022-02-10 NOTE — Progress Notes (Addendum)
PROGRESS NOTE    Marilyn Haas  DGL:[id number] DOB: Jul 19, 1940 DOA: 02/06/2022 PCP: Redmond School, MD    Brief Narrative:  81 year old female with a history of GERD, alcohol use, depression, presents to the hospital after having a fall.  She reported becoming dizzy and falling.  She also reports having shortness of breath on exertion.  She admits to noticing hematochezia and melena which have reportedly been present for months.  Hemoglobin on admission noted to be 6.7.  FOBT positive.  Admitted for further GI evaluation and transfusion of PRBC.   Assessment & Plan:   Principal Problem:   GI bleed Active Problems:   Essential hypertension   Blood loss anemia   Fall at home, initial encounter   Depression   Elevated BUN   GI bleed - Noted to have melena and hematochezia on admission -Seen by GI and underwent EGD which showed hiatal hernia and Cameron ulcers. - Recommended patient to continue on PPI and Carafate - She is still having some dark stools, but do not appear to be dark and tarry as they were on admission.  No hematochezia. -Continue to monitor.    Depression - Continue Abilify and Cymbalta - Continue to monitor   Fall at home, initial encounter - With bruising over bridge of nose - No intracranial abnormality on CT head - Dedicated imaging for facial bones without any signs of fractures - Plan is to discharge to SNF when stable    Normocytic anemia Acute blood loss anemia Iron deficiency anemia B12 deficiency anemia -Anemia is multifactorial - Hemoglobin down to 6.7 on admission - Hemoglobin in September was 12.7, although baseline seems to be 10-11 - She has thus far been transfused 3 units of PRBC -Suspect that her hemoglobin was concentrated on admission and is likely taking longer to stabilize, considering that she was also receiving IV fluids leading to hemodilution -Hemoglobin this morning noted to be 8.6.   -Serum iron 20, ferritin 14,  saturation ratio of 6.  She received IV iron infusion x2 -B12 level 111, she received IM injection x1 and started oral replacement therapy -Folate level was in normal range at 12.2 - Continue to monitor for stability of hemoglobin   Essential hypertension - Holding losartan in the setting of soft pressures and blood loss anemia -Pressures are currently normotensive  Hyponatremia -Appears to be a chronic issue -Overall sodium has trended down -antidepressants may be contributing -overall volume status appears to be euvolemic -Check serum osmolality, urine osmolality, urine sodium, TSH, cortisol   DVT prophylaxis: SCDs Start: 02/06/22 2240  Code Status: Full code Family Communication: Updated her son Alex at the bedside Disposition Plan: Status is: Inpatient Remains inpatient appropriate because: Continue to monitor hemoglobin and sodium for stability.     Consultants:  GI  Procedures:  EGD  Antimicrobials:      Subjective: She is unaware if she has had any bowel movements today.  Staff has not reported any bowel movements.  Objective: Vitals:   02/10/22 0402 02/10/22 0952 02/10/22 1409 02/10/22 2119  BP: 112/61 128/64 (!) 129/58 127/74  Pulse: 81 88 88 79  Resp: 16   19  Temp: 98.8 F (37.1 C) 98.2 F (36.8 C) (!) 97.3 F (36.3 C) 97.6 F (36.4 C)  TempSrc: Oral Oral Oral Oral  SpO2: 94% 96% 94% 93%  Weight:      Height:        Intake/Output Summary (Last 24 hours) at 02/10/2022 2125 Last data filed at  02/10/2022 1700 Gross per 24 hour  Intake 1372.88 ml  Output 300 ml  Net 1072.88 ml   Filed Weights   02/06/22 1538  Weight: 67 kg    Examination:  General exam: Appears calm and comfortable  Respiratory system: Clear to auscultation. Respiratory effort normal. Cardiovascular system: S1 & S2 heard, RRR. No JVD, murmurs, rubs, gallops or clicks. No pedal edema. Gastrointestinal system: Abdomen is nondistended, soft and nontender. No organomegaly or  masses felt. Normal bowel sounds heard. Central nervous system: Alert and oriented. No focal neurological deficits. Extremities: Symmetric 5 x 5 power. Skin: No rashes, lesions or ulcers Psychiatry: Judgement and insight appear normal. Mood & affect appropriate.     Data Reviewed: I have personally reviewed following labs and imaging studies  CBC: Recent Labs  Lab 02/06/22 2310 02/07/22 0438 02/07/22 1116 02/08/22 0429 02/08/22 1953 02/09/22 0449 02/09/22 1542 02/10/22 0356  WBC 8.8 8.9 9.6 7.9 9.0 6.8 8.3 8.2  NEUTROABS 6.0 5.7 6.9  --   --   --   --   --   HGB 7.8* 7.8* 7.4* 6.3* 7.7* 7.1* 9.3* 8.6*  HCT 23.7* 24.0* 23.0* 19.4* 24.3* 21.5* 27.6* 25.7*  MCV 93.3 93.4 94.7 94.6 93.5 91.5 90.8 91.1  PLT 244 249 242 220 218 194 194 323   Basic Metabolic Panel: Recent Labs  Lab 02/06/22 1620 02/07/22 0438 02/08/22 0429 02/10/22 0356  NA 127* 131* 130* 126*  K 4.6 4.3 4.3 3.7  CL 95* 102 103 101  CO2 21* '22 22 23  '$ GLUCOSE 142* 91 97 100*  BUN 46* 39* 22 11  CREATININE 1.06* 1.01* 1.06* 0.97  CALCIUM 8.3* 8.2* 7.7* 7.5*  MG  --  2.1  --   --    GFR: Estimated Creatinine Clearance: 42.7 mL/min (by C-G formula based on SCr of 0.97 mg/dL). Liver Function Tests: Recent Labs  Lab 02/06/22 1620 02/07/22 0438  AST 15 14*  ALT 10 8  ALKPHOS 43 37*  BILITOT 0.2* 0.5  PROT 6.1* 5.7*  ALBUMIN 3.1* 2.9*   Recent Labs  Lab 02/06/22 1620  LIPASE 22   Recent Labs  Lab 02/06/22 1710  AMMONIA 13   Coagulation Profile: No results for input(s): "INR", "PROTIME" in the last 168 hours. Cardiac Enzymes: No results for input(s): "CKTOTAL", "CKMB", "CKMBINDEX", "TROPONINI" in the last 168 hours. BNP (last 3 results) No results for input(s): "PROBNP" in the last 8760 hours. HbA1C: No results for input(s): "HGBA1C" in the last 72 hours. CBG: Recent Labs  Lab 02/06/22 1554  GLUCAP 120*   Lipid Profile: No results for input(s): "CHOL", "HDL", "LDLCALC", "TRIG",  "CHOLHDL", "LDLDIRECT" in the last 72 hours. Thyroid Function Tests: No results for input(s): "TSH", "T4TOTAL", "FREET4", "T3FREE", "THYROIDAB" in the last 72 hours. Anemia Panel: Recent Labs    02/09/22 0450  VITAMINB12 111*  FOLATE 12.2  FERRITIN 14  TIBC 308  IRON 20*   Sepsis Labs: No results for input(s): "PROCALCITON", "LATICACIDVEN" in the last 168 hours.  No results found for this or any previous visit (from the past 240 hour(s)).       Radiology Studies: No results found.      Scheduled Meds:  ARIPiprazole  2 mg Oral Daily   vitamin B-12  1,000 mcg Oral Daily   DULoxetine  120 mg Oral Daily   folic acid  1 mg Oral Daily   multivitamin with minerals  1 tablet Oral Daily   pantoprazole  40 mg Oral BID  sucralfate  1 g Oral BID   thiamine  100 mg Oral Daily   Or   thiamine  100 mg Intravenous Daily   zolpidem  5 mg Oral QHS   Continuous Infusions:     LOS: 4 days    Time spent: 35 mins    Kathie Dike, MD [facility]   If 7PM-7AM, please contact night-coverage [email or web address]  02/10/2022, 9:25 PM

## 2022-02-10 NOTE — TOC Progression Note (Signed)
Transition of Care [facility]) - Progression Note    Patient Details  Name: Marilyn Haas MRN: [id number] Date of Birth: 1941-01-02  [facility]) CM/SW Contact  Marilyn Haas, Marilyn Haas Phone Number: 02/10/2022, 1:34 PM  Clinical Narrative:    Theodosia Paling, accepts bed offer from [facility].    Expected Discharge Plan: [place] Barriers to Discharge: Continued Medical Work up  Expected Discharge Plan and Services Expected Discharge Plan: [place] arrangements for the past 2 months: Single Family Home                                       Social Determinants of Health (SDOH) Interventions    Readmission Risk Interventions     No data to display

## 2022-02-10 NOTE — Progress Notes (Signed)
Patient slept well throughout the night with no complaints. Call light within reach.

## 2022-02-11 DIAGNOSIS — K254 Chronic or unspecified gastric ulcer with hemorrhage: Secondary | ICD-10-CM

## 2022-02-11 DIAGNOSIS — M159 Polyosteoarthritis, unspecified: Secondary | ICD-10-CM | POA: Diagnosis not present

## 2022-02-11 DIAGNOSIS — D510 Vitamin B12 deficiency anemia due to intrinsic factor deficiency: Secondary | ICD-10-CM | POA: Diagnosis not present

## 2022-02-11 DIAGNOSIS — F411 Generalized anxiety disorder: Secondary | ICD-10-CM | POA: Diagnosis not present

## 2022-02-11 DIAGNOSIS — D500 Iron deficiency anemia secondary to blood loss (chronic): Secondary | ICD-10-CM | POA: Diagnosis not present

## 2022-02-11 DIAGNOSIS — Z170 Estrogen receptor positive status [ER+]: Secondary | ICD-10-CM | POA: Diagnosis not present

## 2022-02-11 DIAGNOSIS — D519 Vitamin B12 deficiency anemia, unspecified: Secondary | ICD-10-CM | POA: Diagnosis not present

## 2022-02-11 DIAGNOSIS — K922 Gastrointestinal hemorrhage, unspecified: Secondary | ICD-10-CM | POA: Diagnosis not present

## 2022-02-11 DIAGNOSIS — M6281 Muscle weakness (generalized): Secondary | ICD-10-CM | POA: Diagnosis not present

## 2022-02-11 DIAGNOSIS — K921 Melena: Secondary | ICD-10-CM | POA: Diagnosis not present

## 2022-02-11 DIAGNOSIS — E871 Hypo-osmolality and hyponatremia: Secondary | ICD-10-CM | POA: Diagnosis not present

## 2022-02-11 DIAGNOSIS — M5459 Other low back pain: Secondary | ICD-10-CM | POA: Diagnosis not present

## 2022-02-11 DIAGNOSIS — F5101 Primary insomnia: Secondary | ICD-10-CM | POA: Diagnosis not present

## 2022-02-11 DIAGNOSIS — F339 Major depressive disorder, recurrent, unspecified: Secondary | ICD-10-CM | POA: Diagnosis not present

## 2022-02-11 DIAGNOSIS — K219 Gastro-esophageal reflux disease without esophagitis: Secondary | ICD-10-CM | POA: Diagnosis not present

## 2022-02-11 DIAGNOSIS — D649 Anemia, unspecified: Secondary | ICD-10-CM | POA: Diagnosis not present

## 2022-02-11 DIAGNOSIS — C50411 Malignant neoplasm of upper-outer quadrant of right female breast: Secondary | ICD-10-CM | POA: Diagnosis not present

## 2022-02-11 DIAGNOSIS — I10 Essential (primary) hypertension: Secondary | ICD-10-CM | POA: Diagnosis not present

## 2022-02-11 DIAGNOSIS — Z9181 History of falling: Secondary | ICD-10-CM | POA: Diagnosis not present

## 2022-02-11 DIAGNOSIS — R488 Other symbolic dysfunctions: Secondary | ICD-10-CM | POA: Diagnosis not present

## 2022-02-11 DIAGNOSIS — R1312 Dysphagia, oropharyngeal phase: Secondary | ICD-10-CM | POA: Diagnosis not present

## 2022-02-11 DIAGNOSIS — R296 Repeated falls: Secondary | ICD-10-CM | POA: Diagnosis not present

## 2022-02-11 DIAGNOSIS — F331 Major depressive disorder, recurrent, moderate: Secondary | ICD-10-CM | POA: Diagnosis not present

## 2022-02-11 DIAGNOSIS — Z741 Need for assistance with personal care: Secondary | ICD-10-CM | POA: Diagnosis not present

## 2022-02-11 DIAGNOSIS — R262 Difficulty in walking, not elsewhere classified: Secondary | ICD-10-CM | POA: Diagnosis not present

## 2022-02-11 DIAGNOSIS — Z96642 Presence of left artificial hip joint: Secondary | ICD-10-CM | POA: Diagnosis not present

## 2022-02-11 DIAGNOSIS — M810 Age-related osteoporosis without current pathological fracture: Secondary | ICD-10-CM | POA: Diagnosis not present

## 2022-02-11 LAB — CBC
HCT: 27.2 % — ABNORMAL LOW (ref 36.0–46.0)
Hemoglobin: 8.9 g/dL — ABNORMAL LOW (ref 12.0–15.0)
MCH: 30.2 pg (ref 26.0–34.0)
MCHC: 32.7 g/dL (ref 30.0–36.0)
MCV: 92.2 fL (ref 80.0–100.0)
Platelets: 229 10*3/uL (ref 150–400)
RBC: 2.95 MIL/uL — ABNORMAL LOW (ref 3.87–5.11)
RDW: 15 % (ref 11.5–15.5)
WBC: 6.7 10*3/uL (ref 4.0–10.5)
nRBC: 0 % (ref 0.0–0.2)

## 2022-02-11 LAB — BASIC METABOLIC PANEL
Anion gap: 4 — ABNORMAL LOW (ref 5–15)
BUN: 7 mg/dL — ABNORMAL LOW (ref 8–23)
CO2: 23 mmol/L (ref 22–32)
Calcium: 8 mg/dL — ABNORMAL LOW (ref 8.9–10.3)
Chloride: 101 mmol/L (ref 98–111)
Creatinine, Ser: 0.96 mg/dL (ref 0.44–1.00)
GFR, Estimated: 59 mL/min — ABNORMAL LOW (ref >60)
Glucose, Bld: 94 mg/dL (ref 70–99)
Potassium: 3.9 mmol/L (ref 3.5–5.1)
Sodium: 128 mmol/L — ABNORMAL LOW (ref 135–145)

## 2022-02-11 LAB — TSH: TSH: 5.217 u[IU]/mL — ABNORMAL HIGH (ref 0.350–4.500)

## 2022-02-11 LAB — CORTISOL: Cortisol, Plasma: 12.6 ug/dL

## 2022-02-11 LAB — SODIUM, URINE, RANDOM: Sodium, Ur: 27 mmol/L

## 2022-02-11 LAB — OSMOLALITY, URINE: Osmolality, Ur: 158 mOsm/kg — ABNORMAL LOW (ref 300–900)

## 2022-02-11 LAB — OSMOLALITY: Osmolality: 268 mOsm/kg — ABNORMAL LOW (ref 275–295)

## 2022-02-11 LAB — SARS CORONAVIRUS 2 BY RT PCR: SARS Coronavirus 2 by RT PCR: NEGATIVE

## 2022-02-11 MED ORDER — CYANOCOBALAMIN 1000 MCG PO TABS: 1000.0000 ug | ORAL_TABLET | Freq: Every day | ORAL | Status: DC

## 2022-02-11 MED ORDER — HYDROCODONE-ACETAMINOPHEN 10-325 MG PO TABS: 1.0000 | ORAL_TABLET | Freq: Three times a day (TID) | ORAL | 0 refills | Status: DC

## 2022-02-11 MED ORDER — FERROUS SULFATE 325 (65 FE) MG PO TABS: 325.0000 mg | ORAL_TABLET | Freq: Every day | ORAL | 3 refills | Status: DC

## 2022-02-11 MED ORDER — ALPRAZOLAM 0.25 MG PO TABS: 0.2500 mg | ORAL_TABLET | Freq: Two times a day (BID) | ORAL | 0 refills | Status: DC | PRN

## 2022-02-11 MED ORDER — PANTOPRAZOLE SODIUM 40 MG PO TBEC: 40.0000 mg | DELAYED_RELEASE_TABLET | Freq: Two times a day (BID) | ORAL | Status: DC

## 2022-02-11 MED ORDER — VITAMIN B-1 100 MG PO TABS: 100.0000 mg | ORAL_TABLET | Freq: Every day | ORAL | Status: DC

## 2022-02-11 MED ORDER — FOLIC ACID 1 MG PO TABS: 1.0000 mg | ORAL_TABLET | Freq: Every day | ORAL | Status: DC

## 2022-02-11 MED ORDER — FERROUS SULFATE 325 (65 FE) MG PO TABS
325.0000 mg | ORAL_TABLET | Freq: Every day | ORAL | Status: DC
  Administered 2022-02-11: 325 mg via ORAL
  Filled 2022-02-11: qty 1

## 2022-02-11 MED ORDER — SUCRALFATE 1 GM/10ML PO SUSP: 1.0000 g | Freq: Two times a day (BID) | ORAL | 0 refills | Status: DC

## 2022-02-11 NOTE — Discharge Summary (Signed)
Physician Discharge Summary   Patient: Marilyn Haas MRN: [id number] DOB: 05/21/1940  Admit date:     02/06/2022  Discharge date: 02/11/22  Discharge Physician: Onalee Hua Assad Harbeson   PCP: Elfredia Nevins, MD   Recommendations at discharge:   Please follow up with primary care provider within 1-2 weeks  Please repeat BMP and CBC in one week      Hospital Course: 81 year old female with a history of GERD, alcohol use, depression, presents to the hospital after having a fall. She reported becoming dizzy and falling. She also reports having shortness of breath on exertion. She admits to noticing hematochezia and melena which have reportedly been present for months. Hemoglobin on admission noted to be 6.7. FOBT positive. Admitted for further GI evaluation and transfusion of PRBC.  During hospitalization, the patient was transfused with 2 units total of PRBC.  Her hemoglobin ultimately stabilized.  She was seen by GI.  She underwent EGD on 02/07/2022 which showed a large hiatus hernia with 5 Cameron erosions.  There was no active bleeding noted.  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  Biopsies were negative for H. pylori.  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  The patient was started on pantoprazole twice daily and Carafate twice daily.  Workup for her anemia showed that she had iron deficiency with iron saturation of 6% and ferritin of 14.  B12 was 111.  These deficiencies were supplemented appropriately.  She was given 2 IV doses of IV iron and started on ferrous sulfate.  She was started on B12 supplementation after 1 IM dose.  The patient has chronic hyponatremia with a range of 127-130.  This is likely due to poor solute intake and volume depletion in the setting of her alcohol dependence.  Her sodium remained stable and on discharge was 128.  Her diet was liberalized.  Her family felt that she would benefit from skilled nurse for nursing for  STR.    Assessment and  Plan: GI bleed - Noted to have melena and hematochezia on admission -Seen by GI and underwent EGD which showed hiatal hernia and Cameron ulcers. - Recommended patient to continue on PPI and Carafate - She is still having some dark stools, but do not appear to be dark and tarry as they were on admission.  No hematochezia. -Hemoglobin remained stable at the time of discharge at 8.9 -Repeat CBC 1 week after discharge     Depression - Continue Abilify and Cymbalta - Continue to monitor   Fall at home, initial encounter - With bruising over bridge of nose - No intracranial abnormality on CT head - Dedicated imaging for facial bones without any signs of fractures - Seen by PT with recommendations for home health PT -Family has concerns about her returning home since she is alone and will not have anybody with her.  Notes indicate that she was able to ambulate 20 feet.  Will ask PT to reassess her since she does not have significant support at home on discharge, to see if she will benefit from a short stay at skilled nursing facility to ensure stability and reduce her risk of fall.     Normocytic anemia Acute blood loss anemia Iron deficiency anemia B12 deficiency anemia -Anemia is multifactorial - Hemoglobin down to 6.7 on admission - Hemoglobin in September was 12.7, although baseline seems to be 10-11 - She has thus far been transfused 2 units of PRBC -Suspect that her hemoglobin was concentrated on  admission and is likely taking longer to stabilize, considering that she was also receiving IV fluids leading to hemodilution -Hemoglobin this morning noted to be 7.1.  Will transfuse 1 more unit of PRBC and monitor for stability -Serum iron 20, ferritin 14, saturation ratio of 6.  IV iron infusions x 2>> start ferrous sulfate -B12 level 111--given 1 IM dose>> start p.o. supplementation daily -Folate level was in normal range at 12.2    Essential hypertension - Holding losartan in the  setting of soft pressures and blood loss anemia  --BP remains well-controlled  Hyponatremia -Secondary to poor solute intake and volume depletion in the setting of alcohol use -Has been chronic -Range 127-130 -Sodium 120 at the time of discharge       Consultants: GI Procedures performed: EGD  Disposition: Skilled nursing facility Diet recommendation:  Regular diet DISCHARGE MEDICATION: Allergies as of 02/11/2022       Reactions   Avelox [moxifloxacin Hcl In Nacl] Other (See Comments)   Altered mental status        Medication List     STOP taking these medications    losartan 25 MG tablet Commonly known as: COZAAR   Metamucil Smooth Texture 58.6 % powder Generic drug: psyllium   omeprazole 20 MG capsule Commonly known as: PRILOSEC       TAKE these medications    ALPRAZolam 0.25 MG tablet Commonly known as: XANAX Take 1 tablet (0.25 mg total) by mouth 2 (two) times daily as needed for anxiety. What changed:  how much to take when to take this reasons to take this   ARIPiprazole 2 MG tablet Commonly known as: ABILIFY Take 2 mg by mouth daily.   cyanocobalamin 1000 MCG tablet Take 1 tablet (1,000 mcg total) by mouth daily. Start taking on: February 12, 2022   DULoxetine 60 MG capsule Commonly known as: CYMBALTA Take 120 mg by mouth daily.   ferrous sulfate 325 (65 FE) MG tablet Take 1 tablet (325 mg total) by mouth daily with breakfast.   folic acid 1 MG tablet Commonly known as: FOLVITE Take 1 tablet (1 mg total) by mouth daily. Start taking on: February 12, 2022   furosemide 20 MG tablet Commonly known as: LASIX Take 20 mg by mouth daily as needed for fluid.   HYDROcodone-acetaminophen 10-325 MG tablet Commonly known as: NORCO Take 1 tablet by mouth 3 (three) times daily.   pantoprazole 40 MG tablet Commonly known as: PROTONIX Take 1 tablet (40 mg total) by mouth 2 (two) times daily.   sucralfate 1 GM/10ML suspension Commonly  known as: CARAFATE Take 10 mLs (1 g total) by mouth 2 (two) times daily.   tamoxifen 10 MG tablet Commonly known as: NOLVADEX Take 1 tablet (10 mg total) by mouth every other day.   thiamine 100 MG tablet Commonly known as: Vitamin B-1 Take 1 tablet (100 mg total) by mouth daily. Start taking on: February 12, 2022   Vitamin D3 125 MCG (5000 UT) Caps Take 1 capsule by mouth daily.   zinc gluconate 50 MG tablet Take 50 mg by mouth daily.   zolpidem 5 MG tablet Commonly known as: AMBIEN Take 5 mg by mouth at bedtime.        Contact information for after-discharge care     Destination     [facility]erred SNF .   Service: Skilled Nursing Contact information: [address] [place] [address] [phone]  Discharge Exam: Filed Weights   02/06/22 1538  Weight: 67 kg   HEENT:  [place]/AT, No thrush, no icterus CV:  RRR, no rub, no S3, no S4 Lung:  CTA, no wheeze, no rhonchi Abd:  soft/+BS, NT Ext:  No edema, no lymphangitis, no synovitis, no rash   Condition at discharge: stable  The results of significant diagnostics from this hospitalization (including imaging, microbiology, ancillary and laboratory) are listed below for reference.   Imaging Studies: DG Facial Bones 1-2 Views  Result Date: 02/06/2022 CLINICAL DATA:  Status post fall. EXAM: FACIAL BONES - 1-2 VIEW COMPARISON:  None Available. FINDINGS: There is no evidence of an acute fracture or other significant bone abnormality. No orbital emphysema or sinus air-fluid levels are seen. IMPRESSION: No acute osseous abnormality. Electronically Signed   By: Aram Candela M.D.   On: 02/06/2022 22:26   DG Chest 2 View  Result Date: 02/06/2022 CLINICAL DATA:  Fall. EXAM: CHEST - 2 VIEW COMPARISON:  12/07/2021 FINDINGS: Low volume film. The cardio pericardial silhouette is enlarged. Atelectasis or infiltrate noted at the right base with small right  pleural effusion. Left lung clear. Telemetry leads overlie the chest. IMPRESSION: Low volume film with right base atelectasis or infiltrate and small right pleural effusion. Electronically Signed   By: Kennith Center M.D.   On: 02/06/2022 17:09   DG Humerus Right  Result Date: 02/06/2022 CLINICAL DATA:  Fall. EXAM: RIGHT HUMERUS - 2+ VIEW COMPARISON:  None Available. FINDINGS: There is no evidence of fracture or other focal bone lesions. Soft tissues are unremarkable. IMPRESSION: Negative. Electronically Signed   By: Lupita Raider M.D.   On: 02/06/2022 16:59   CT Head Wo Contrast  Result Date: 02/06/2022 CLINICAL DATA:  Mental status changes.  Syncope. EXAM: CT HEAD WITHOUT CONTRAST TECHNIQUE: Contiguous axial images were obtained from the base of the skull through the vertex without intravenous contrast. RADIATION DOSE REDUCTION: This exam was performed according to the departmental dose-optimization program which includes automated exposure control, adjustment of the mA and/or kV according to patient size and/or use of iterative reconstruction technique. COMPARISON:  MR head 12/08/2021, CT head 12/07/2021 FINDINGS: Brain: No evidence of acute infarction, hemorrhage, hydrocephalus, extra-axial collection or mass lesion/mass effect. Vascular: There is minimal atherosclerotic calcification of the internal carotid arteries. No hyperdense vessels. Skull: Normal. Negative for fracture or focal lesion. Sinuses/Orbits: There is minimal mucosal thickening of the paranasal sinuses. Mastoid air cells are normally aerated. Other: None IMPRESSION: 1. No evidence of acute intracranial abnormality. 2. Minimal paranasal chronic sinus changes. Electronically Signed   By: Norva Pavlov M.D.   On: 02/06/2022 16:44    Microbiology: Results for orders placed or performed during the hospital encounter of 12/07/21  Resp Panel by RT-PCR (Flu A&B, Covid) Anterior Nasal Swab     Status: None   Collection Time: 12/07/21   9:54 PM   Specimen: Anterior Nasal Swab  Result Value Ref Range Status   SARS Coronavirus 2 by RT PCR NEGATIVE NEGATIVE Final    Comment: (NOTE) SARS-CoV-2 target nucleic acids are NOT DETECTED.  The SARS-CoV-2 RNA is generally detectable in upper respiratory specimens during the acute phase of infection. The lowest concentration of SARS-CoV-2 viral copies this assay can detect is 138 copies/mL. A negative result does not preclude SARS-Cov-2 infection and should not be used as the sole basis for treatment or other patient management decisions. A negative result may occur with  improper specimen collection/handling, submission of specimen other than  nasopharyngeal swab, presence of viral mutation(s) within the areas targeted by this assay, and inadequate number of viral copies(<138 copies/mL). A negative result must be combined with clinical observations, patient history, and epidemiological information. The expected result is Negative.  Fact Sheet for Patients:  BloggerCourse.com  Fact Sheet for Healthcare Providers:  SeriousBroker.it  This test is no t yet approved or cleared by the [place] FDA and  has been authorized for detection and/or diagnosis of SARS-CoV-2 by FDA under an Emergency Use Authorization (EUA). This EUA will remain  in effect (meaning this test can be used) for the duration of the COVID-19 declaration under Section 564(b)(1) of the Act, 21 U.S.C.section 360bbb-3(b)(1), unless the authorization is terminated  or revoked sooner.       Influenza A by PCR NEGATIVE NEGATIVE Final   Influenza B by PCR NEGATIVE NEGATIVE Final    Comment: (NOTE) The Xpert Xpress SARS-CoV-2/FLU/RSV plus assay is intended as an aid in the diagnosis of influenza from Nasopharyngeal swab specimens and should not be used as a sole basis for treatment. Nasal washings and aspirates are unacceptable for Xpert Xpress  SARS-CoV-2/FLU/RSV testing.  Fact Sheet for Patients: BloggerCourse.com  Fact Sheet for Healthcare Providers: SeriousBroker.it  This test is not yet approved or cleared by the [place] FDA and has been authorized for detection and/or diagnosis of SARS-CoV-2 by FDA under an Emergency Use Authorization (EUA). This EUA will remain in effect (meaning this test can be used) for the duration of the COVID-19 declaration under Section 564(b)(1) of the Act, 21 U.S.C. section 360bbb-3(b)(1), unless the authorization is terminated or revoked.  Performed at [facility], [address] [place], [place] [address]   Urine Culture     Status: None   Collection Time: 12/07/21  9:54 PM   Specimen: Urine, Clean Catch  Result Value Ref Range Status   Specimen Description   Final    URINE, CLEAN CATCH Performed at [facility], [address], [place], [place] [address]    Special Requests   Final    NONE Performed at [facility], [address], [place], [place] [address]    Culture   Final    NO GROWTH Performed at [facility], [address] [place], [place] [address]    Report Status 12/09/2021 FINAL  Final    Labs: CBC: Recent Labs  Lab 02/06/22 2310 02/07/22 0438 02/07/22 1116 02/08/22 0429 02/08/22 1953 02/09/22 0449 02/09/22 1542 02/10/22 0356 02/11/22 0552  WBC 8.8 8.9 9.6   < > 9.0 6.8 8.3 8.2 6.7  NEUTROABS 6.0 5.7 6.9  --   --   --   --   --   --   HGB 7.8* 7.8* 7.4*   < > 7.7* 7.1* 9.3* 8.6* 8.9*  HCT 23.7* 24.0* 23.0*   < > 24.3* 21.5* 27.6* 25.7* 27.2*  MCV 93.3 93.4 94.7   < > 93.5 91.5 90.8 91.1 92.2  PLT 244 249 242   < > 218 194 194 203 229   < > = values in this interval not displayed.   Basic Metabolic Panel: Recent Labs  Lab 02/06/22 1620 02/07/22 0438 02/08/22 0429 02/10/22 0356 02/11/22 0552  NA 127* 131* 130* 126* 128*  K 4.6 4.3 4.3 3.7 3.9  CL 95* 102 103 101 101  CO2 21* 22 22 23  23   GLUCOSE 142* 91 97 100* 94  BUN 46* 39* 22 11 7*  CREATININE 1.06* 1.01* 1.06* 0.97 0.96  CALCIUM 8.3* 8.2* 7.7* 7.5* 8.0*  MG  --  2.1  --   --   --    Liver Function Tests: Recent Labs  Lab 02/06/22 1620 02/07/22 0438  AST 15 14*  ALT 10 8  ALKPHOS 43 37*  BILITOT 0.2* 0.5  PROT 6.1* 5.7*  ALBUMIN 3.1* 2.9*   CBG: Recent Labs  Lab 02/06/22 1554  GLUCAP 120*    Discharge time spent: greater than 30 minutes.  Signed: Catarina Hartshorn, MD [facility] 02/11/2022

## 2022-02-11 NOTE — Progress Notes (Signed)
Patient discharged to [facility], report called and given to [facility] LPN. IV discontinued,catheter intact. Transported by staff to awaiting floor. Vital signs stable.

## 2022-02-11 NOTE — Care Management Important Message (Signed)
Important Message  Patient Details  Name: Marilyn Haas MRN: [id number] Date of Birth: [birth year]   Medicare Important Message Given:  Other (see comment) (air/contact precautions-unable to reach by phone at [phone], attempted to reach son Lynelle Smoke at [phone]- unable to reach, copy mailed to address on file)     Tommy Medal 02/11/2022, 10:26 AM

## 2022-02-11 NOTE — TOC Transition Note (Signed)
Transition of Care [facility]) - CM/SW Discharge Note   Patient Details  Name: Marilyn Haas MRN: [id number] Date of Birth: 05-Sep-1940  [facility]) CM/SW Contact:  Salome Arnt, [place] Phone Number: 02/11/2022, 10:24 AM   Clinical Narrative:  Pt d/c today to [facility]. Pt, son Lynelle Smoke, and facility aware and agreeable. Pt will transfer with staff. D/C summary sent to SNF. RN given number to call report and is aware to wait for negative COVID results prior to sending pt.   LCSW addressed substance abuse consult. Pt states she does not drink and this is not a problem for her.      Final next level of care: [place] Barriers to Discharge: Barriers Resolved   Patient Goals and CMS Choice Patient states their goals for this hospitalization and ongoing recovery are:: rehab then home      Discharge Placement              Patient chooses bed at: [facility] Patient to be transferred to facility by: staff Name of family member notified: Kemp-son Patient and family notified of of transfer: 02/11/22  Discharge Plan and Services                                     Social Determinants of Health (SDOH) Interventions     Readmission Risk Interventions     No data to display

## 2022-02-12 ENCOUNTER — Non-Acute Institutional Stay (SKILLED_NURSING_FACILITY): Payer: Medicare Other | Admitting: Adult Health

## 2022-02-12 ENCOUNTER — Other Ambulatory Visit: Payer: Self-pay | Admitting: *Deleted

## 2022-02-12 ENCOUNTER — Encounter: Payer: Self-pay | Admitting: Adult Health

## 2022-02-12 DIAGNOSIS — D510 Vitamin B12 deficiency anemia due to intrinsic factor deficiency: Secondary | ICD-10-CM

## 2022-02-12 DIAGNOSIS — C50411 Malignant neoplasm of upper-outer quadrant of right female breast: Secondary | ICD-10-CM

## 2022-02-12 DIAGNOSIS — E871 Hypo-osmolality and hyponatremia: Secondary | ICD-10-CM | POA: Diagnosis not present

## 2022-02-12 DIAGNOSIS — F331 Major depressive disorder, recurrent, moderate: Secondary | ICD-10-CM

## 2022-02-12 DIAGNOSIS — K921 Melena: Secondary | ICD-10-CM | POA: Diagnosis not present

## 2022-02-12 DIAGNOSIS — K219 Gastro-esophageal reflux disease without esophagitis: Secondary | ICD-10-CM

## 2022-02-12 DIAGNOSIS — D500 Iron deficiency anemia secondary to blood loss (chronic): Secondary | ICD-10-CM

## 2022-02-12 DIAGNOSIS — I10 Essential (primary) hypertension: Secondary | ICD-10-CM

## 2022-02-12 DIAGNOSIS — Z170 Estrogen receptor positive status [ER+]: Secondary | ICD-10-CM | POA: Diagnosis not present

## 2022-02-12 NOTE — Progress Notes (Signed)
Location:  [facility] Room Number: 143 Place of Service:  SNF (31)   CODE STATUS: full   Allergies  Allergen Reactions   Avelox [Moxifloxacin Hcl In Nacl] Other (See Comments)    Altered mental status    Chief Complaint  Patient presents with   Hospitalization Follow-up    HPI:  She is a 81 year old woman who has been hospitalized from 02-06-22 through 02-11-22. Her medical history includes GERD; alcohol use; depression. She presented to the hospital after having a fall at home. She had become dizzy prior to her fall. She did have shortness of breath with exertion. She had blood in her stool for several months prior to her hospitalization. Her hgb was 6.7 with FOBT positive. She required a total of 2 units of packed red blood cells. She underwent an EGD on 02-07-22; demonstrating: large hiatus hernia with 5 Cameron erosions.  There was no active bleeding noted.  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  She was started on protonix and carafate. She had vitamin deficiencies and iron deficiencies related to her alcohol abuse. She was given IV iron and started on po; give IM vitamin B12 then started on po supplement. She is here for short term rehab with her goal to return back home. She denies any weakness or dizziness at this time. She denies any pain. 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: Chronic hyponatremia    Past Medical History:  Diagnosis Date   Anemia    Anxiety    Arthritis    Breast cancer ([place]) 05/19/2016   Right breast   Chronic back pain    Scoliosis, stenosis   Depression    Diverticulosis    GERD (gastroesophageal reflux disease)    Hard of hearing    History of blood transfusion    History of  colon polyps    History of hiatal hernia    History of pneumonia    Hypertension    Insomnia    Osteopenia    Osteoporosis    Personal history of radiation therapy    Thickened endometrium 09/29/2017    Past Surgical History:  Procedure Laterality Date   BREAST LUMPECTOMY WITH RADIOACTIVE SEED AND SENTINEL LYMPH NODE BIOPSY Right 07/31/2016   Procedure: BREAST LUMPECTOMY WITH RADIOACTIVE SEED AND SENTINEL LYMPH NODE BIOPSY, INJECT BLUE DYE RIGHT BREAST;  Surgeon: Fanny Skates, MD;  Location: [facility];  Service: General;  Laterality: Right;   BREAST LUMPECTOMY WITH RADIOACTIVE SEED LOCALIZATION Left 05/07/2021   Procedure: LEFT BREAST LUMPECTOMY WITH RADIOACTIVE SEED LOCALIZATION;  Surgeon: Erroll Luna, MD;  Location: [facility];  Service: General;  Laterality: Left;   COLONOSCOPY     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 07/04/2014   Procedure: LEFT CONVERSION TO TOTAL HIP ARTHROPLASTY;  Surgeon: Gaynelle Arabian, MD;  Location: [facility];  Service: Orthopedics;  Laterality: Left;   ESOPHAGEAL DILATION N/A 09/24/2017   Procedure: ESOPHAGEAL DILATION;  Surgeon: Rogene Houston, MD;  [facility];  Service: Endoscopy;  Laterality: N/A;   ESOPHAGEAL DILATION N/A 01/19/2020   Procedure: ESOPHAGEAL DILATION;  Surgeon: Harvel Quale, MD;  [facility];  Service: Gastroenterology;  Laterality: N/A;   ESOPHAGEAL DILATION  02/28/2020   Procedure: ESOPHAGEAL DILATION;  Surgeon:  Rogene Houston, MD;  [facility];  Service: Endoscopy;;   ESOPHAGOGASTRODUODENOSCOPY N/A 01/26/2014   Procedure: ESOPHAGOGASTRODUODENOSCOPY (EGD);  Surgeon: Rogene Houston, MD;  [facility];  Service: Endoscopy;  Laterality: N/A;  1030   ESOPHAGOGASTRODUODENOSCOPY (EGD) WITH PROPOFOL N/A 09/24/2017   Procedure: ESOPHAGOGASTRODUODENOSCOPY (EGD) WITH PROPOFOL;  Surgeon: Rogene Houston, MD;  [facility];  Service: Endoscopy;  Laterality: N/A;  11:15    ESOPHAGOGASTRODUODENOSCOPY (EGD) WITH PROPOFOL N/A 01/19/2020   Procedure: ESOPHAGOGASTRODUODENOSCOPY (EGD) WITH PROPOFOL;  Surgeon: Harvel Quale, MD;  [facility];  Service: Gastroenterology;  Laterality: N/A;  1200   ESOPHAGOGASTRODUODENOSCOPY (EGD) WITH PROPOFOL N/A 02/28/2020   Procedure: ESOPHAGOGASTRODUODENOSCOPY (EGD) WITH PROPOFOL;  Surgeon: Rogene Houston, MD;  [facility];  Service: Endoscopy;  Laterality: N/A;  2:45   HIP FRACTURE SURGERY     05/2009 left -[facility]   HYSTEROSCOPY WITH D & C  05/24/2018   Procedure: DILATATION AND CURETTAGE /HYSTEROSCOPY (PROCEDURE # 2)/PAP SMEAR (PROCEDURE #1);  Surgeon: Jonnie Kind, MD;  Location[facility];  Service: Gynecology;;   Venia Minks DILATION N/A 01/26/2014   Procedure: Keturah Shavers;  Surgeon: Rogene Houston, MD;  [facility];  Service: Endoscopy;  Laterality: N/A;    Social History   Socioeconomic History   Marital status: Married    Spouse name: Not on file   Number of children: 3   Years of education: Not on file   Highest education level: Not on file  Occupational History    Employer: RETIRED  Tobacco Use   Smoking status: Former    Packs/day: 0.50    Years: 2.00    Total pack years: 1.00    Types: Cigarettes    Quit date: 01/15/1961    Years since quitting: 61.1    Passive exposure: Never   Smokeless tobacco: Never  Vaping Use   Vaping Use: Never used  Substance and Sexual Activity   Alcohol use: Yes    Alcohol/week: 15.0 standard drinks of alcohol    Types: 15 Cans of beer per week    Comment: daily   Drug use: No   Sexual activity: Not Currently    Birth control/protection: Post-menopausal  Other Topics Concern   Not on file  Social History Narrative   Not on file   Social Determinants of Health   Financial Resource Strain: Not on file  Food Insecurity: No Food Insecurity (02/06/2022)   Hunger Vital Sign    Worried About Running Out of Food in the Last  Year: Never true    Ran Out of Food in the Last Year: Never true  Transportation Needs: No Transportation Needs (02/06/2022)   PRAPARE - Hydrologist (Medical): No    Lack of Transportation (Non-Medical): No  Physical Activity: Not on file  Stress: Not on file  Social Connections: Not on file  Intimate Partner Violence: Not At Risk (02/06/2022)   Humiliation, Afraid, Rape, and Kick questionnaire    Fear of Current or Ex-Partner: No    Emotionally Abused: No    Physically Abused: No    Sexually Abused: No   Family History  Problem Relation Age of Onset   Heart failure Mother    Diabetes Mother    Hypertension Mother    Depression Mother    Other Father        stomach ulcers; abd aneursym   Hypertension Sister    Heart  failure Sister    Diabetes Sister    Depression Sister    Diabetes Sister        prediabetes   Cancer Maternal Aunt        breast      VITAL SIGNS BP (!) 102/59   Pulse 80   Temp 97.9 F (36.6 C)   Resp 20   Ht '5\' 7"'$  (1.702 m)   Wt 159 lb 6.4 oz (72.3 kg)   SpO2 95%   BMI 24.97 kg/m   Outpatient Encounter Medications as of 02/12/2022  Medication Sig Note   ALPRAZolam (XANAX) 0.25 MG tablet Take 1 tablet (0.25 mg total) by mouth 2 (two) times daily as needed for anxiety.    ARIPiprazole (ABILIFY) 2 MG tablet Take 2 mg by mouth daily. 12/08/2021: LF:11/14/2021 2 MG TABS (disp 30, 30d supply)    Cholecalciferol (VITAMIN D3) 125 MCG (5000 UT) CAPS Take 1 capsule by mouth daily.    cyanocobalamin 1000 MCG tablet Take 1 tablet (1,000 mcg total) by mouth daily.    DULoxetine (CYMBALTA) 60 MG capsule Take 120 mg by mouth daily. 12/08/2021: LF:09/17/2021 60 MG CPEP (disp 180, 90d supply)    ferrous sulfate 325 (65 FE) MG tablet Take 1 tablet (325 mg total) by mouth daily with breakfast.    folic acid (FOLVITE) 1 MG tablet Take 1 tablet (1 mg total) by mouth daily.    furosemide (LASIX) 20 MG tablet Take 20 mg by mouth daily as  needed for fluid. 12/08/2021: LF:11/14/2021 20 MG TABS (disp 30, 30d supply)    HYDROcodone-acetaminophen (NORCO) 10-325 MG tablet Take 1 tablet by mouth 3 (three) times daily.    pantoprazole (PROTONIX) 40 MG tablet Take 1 tablet (40 mg total) by mouth 2 (two) times daily.    sucralfate (CARAFATE) 1 GM/10ML suspension Take 10 mLs (1 g total) by mouth 2 (two) times daily.    tamoxifen (NOLVADEX) 10 MG tablet Take 1 tablet (10 mg total) by mouth every other day. 12/08/2021: LF: 10/28/2021 10 MG TABS (disp 30, 60d supply)     thiamine (VITAMIN B-1) 100 MG tablet Take 1 tablet (100 mg total) by mouth daily.    zinc gluconate 50 MG tablet Take 50 mg by mouth daily.    zolpidem (AMBIEN) 5 MG tablet Take 5 mg by mouth at bedtime. 12/08/2021: LF:11/14/2021 5 MG TABS (disp 30, 30d supply)    No facility-administered encounter medications on file as of 02/12/2022.     SIGNIFICANT DIAGNOSTIC EXAMS  TODAY  02-07-22: upper GI Benign-appearing esophageal stenosis. Not dilated - non critical. - Large hiatal hernia with five Cameron ulcers. - Normal stomach. Biopsied. - Normal examined duodenum.  LABS REVIEWED: TODAY  02-06-22: wbc 7.8; hgb 6.7; hct 20.9; mcv 994.1 plt 309; glucose 142; bun 46; creat 1.06; k+ 4.6; na++ 127; ca 8.3; gfr 52; protein 6.1; albumin 3.1 02-09-22: wbc 6.8; hgb 7.1; hct 21.5; mcv 91.5 plt 194; iron 20; tibc 308; ferritin 14; vitamin B12; 111; folate 12.2 02-11-22: wbc 6.7; hgb 8.9; hct 27.2; mcv 92.2 plt 229; glucose 94; bun 7; creat 0.96; k+ 3.9; na++ 128; ca 8.0; gfr 59; tsh 5.217; cortisol 12.6  Review of Systems  Constitutional:  Negative for malaise/fatigue.  Respiratory:  Negative for cough and shortness of breath.   Cardiovascular:  Negative for chest pain, palpitations and leg swelling.  Gastrointestinal:  Negative for abdominal pain, constipation and heartburn.  Musculoskeletal:  Negative for back pain, joint pain and myalgias.  Skin: Negative.   Neurological:   Negative for dizziness.  Psychiatric/Behavioral:  The patient is not nervous/anxious.    Physical Exam Constitutional:      General: She is not in acute distress.    Appearance: She is well-developed. She is not diaphoretic.  Neck:     Thyroid: No thyromegaly.  Cardiovascular:     Rate and Rhythm: Normal rate and regular rhythm.     Heart sounds: Murmur heard.  Pulmonary:     Effort: Pulmonary effort is normal. No respiratory distress.     Breath sounds: Normal breath sounds.  Abdominal:     General: Bowel sounds are normal. There is no distension.     Palpations: Abdomen is soft.     Tenderness: There is no abdominal tenderness.  Musculoskeletal:        General: Normal range of motion.     Cervical back: Neck supple.     Right lower leg: No edema.     Left lower leg: No edema.  Lymphadenopathy:     Cervical: No cervical adenopathy.  Skin:    General: Skin is warm and dry.  Neurological:     Mental Status: She is alert and oriented to person, place, and time.  Psychiatric:        Mood and Affect: Mood normal.       ASSESSMENT/ PLAN:  TODAY  Gastrointestinal bleeding with melena /gastroesophageal reflux disease without esophagitis: is status post EGD; will continue protonix 40 mg twice daily and carafate 1 gm twice daily   2. Essential hypertension: b/p 102/59 will monitor  3.  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   4. 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 Chronic hyponatremia: na++ 128 will monitor  6. Blood loss anemia: iron 22; status post 2 units packed red blood cells; will continue iron daily   7. 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 Vitamin D deficiency: will continue 5,000 units daily   9. Alcohol use: drinks 15 beers per week self report. Will continue thiamine 390 mg daily folic acid 1 mg daily   Will check cbc; bmp    Ok Edwards NP [facility]   call [phone]

## 2022-02-12 NOTE — Patient Outreach (Signed)
Marilyn Haas recently admitted to [facility]. Screening for potential [facility] care coordination services as benefit of insurance plan and PCP.   Update received from SNF social worker. Marilyn Haas is from home alone. Will likely benefit from [facility] care coordination services.   Will continue to follow. Will plan outreach as appropriate.  Marthenia Rolling, MSN, RN,BSN [facility] Coordinator [phone] (Direct dial)

## 2022-02-13 ENCOUNTER — Other Ambulatory Visit: Payer: Self-pay | Admitting: Adult Health

## 2022-02-13 ENCOUNTER — Non-Acute Institutional Stay (SKILLED_NURSING_FACILITY): Payer: Medicare Other | Admitting: Internal Medicine

## 2022-02-13 ENCOUNTER — Encounter: Payer: Self-pay | Admitting: Internal Medicine

## 2022-02-13 DIAGNOSIS — D500 Iron deficiency anemia secondary to blood loss (chronic): Secondary | ICD-10-CM

## 2022-02-13 DIAGNOSIS — Z170 Estrogen receptor positive status [ER+]: Secondary | ICD-10-CM

## 2022-02-13 DIAGNOSIS — C50411 Malignant neoplasm of upper-outer quadrant of right female breast: Secondary | ICD-10-CM

## 2022-02-13 DIAGNOSIS — R296 Repeated falls: Secondary | ICD-10-CM | POA: Diagnosis not present

## 2022-02-13 DIAGNOSIS — D510 Vitamin B12 deficiency anemia due to intrinsic factor deficiency: Secondary | ICD-10-CM

## 2022-02-13 MED ORDER — ALPRAZOLAM 0.25 MG PO TABS: 0.2500 mg | ORAL_TABLET | Freq: Two times a day (BID) | ORAL | 0 refills | Status: DC | PRN

## 2022-02-13 MED ORDER — ZOLPIDEM TARTRATE 5 MG PO TABS: 5.0000 mg | ORAL_TABLET | Freq: Every day | ORAL | 0 refills | Status: DC

## 2022-02-13 NOTE — Progress Notes (Unsigned)
NURSING HOME LOCATION:  [facility] ROOM NUMBER:  143 P  CODE STATUS:  Full Code  [id number] Fusco MD  This is a comprehensive admission note to this SNFperformed on this date less than 30 days from date of admission. Included are preadmission medical/surgical history; reconciled medication list; family history; social history and comprehensive review of systems.  Corrections and additions to the records were documented. Comprehensive physical exam was also performed. Additionally a clinical summary was entered for each active diagnosis pertinent to this admission in the Problem List to enhance continuity of care.  HPI: She was hospitalized 11/24 - 02/11/2022 presenting to the ED after a fall.  Hemoglobin on admission was 6.7 and FOBT was positive.  By history she had been having hematochezia and melena possibly for several months.  She received a total of 2 units of packed red cells with stabilization of the hemoglobin.  EGD was performed 11/25 revealing a large hiatal hernia with 5 Cameron erosions.  No active bleeding was noted.  Benign-appearing stenosis in the middle third of her esophagus was present.  Biopsies were negative for H. pylori. PPI and Carafate were recommended by GI. Iron saturation was 6% and ferritin 14.  B12 was 111.  She received 2 IV doses of IV iron and started on oral ferrous sulfate.  She received an IM dose of B12 followed by oral supplementation.  While hospitalized hemoglobin ranged from a low of 6.3 up to high of 8.9 and hematocrit 19.4 up to 27.6.  At discharge H/H was 8.9/27.2.  Chronic hyponatremia was documented with ranges of 121 up to 131 from December 2022 to the present admission.Final sodium was 128 on 11/29.  CKD stage IIIa was present with GFR's ranging from a low of 50 to up to final value of 59. Because of her advanced age and debilitation; PT recommended SNF placement for rehab.    Past medical and surgical history: Includes breast  cancer, anxiety/depression, history of diverticulosis, GERD, history of colon polyps, essential hypertension, osteoporosis, and chronic back pain due to scoliosis and stenosis. Surgeries and procedures include lumpectomy with radioactive seed implant and sentinel lymph node biopsy in the right breast and breast lumpectomy with radioactive seed localization in the left.  She has had esophageal dilation on multiple occasions.  Social history: She states that she drinks 4 Natural Light beers at night.  Smoking history is remote and insignificant.  Family history: Noncontributory due to advanced age.   Review of systems: She states that she fell when she lost her balance.  She states that she falls easily and is "dizzy a lot."  She does describe vertiginous type symptoms.  With these episodes she describes some tachycardia with exacerbation of anxiety.  When asked why she had been in the hospital her response was "I think they said something about ulcers in the chest and gave me blood."  She describes chronic dysphagia as well as constipation.  She has dark stool which she relates to the iron.  She describes her hands and feet as being cold with associated numbness in the hands.  Constitutional: No fever, significant weight change Eyes: No redness, discharge, pain, vision change ENT/mouth: No nasal congestion, purulent discharge, earache, change in hearing, sore throat  Cardiovascular: No chest pain,paroxysmal nocturnal dyspnea, claudication, edema  Respiratory: No cough, sputum production, hemoptysis, DOE, significant snoring, apnea  Gastrointestinal: No heartburn, abdominal pain, nausea /vomiting, rectal bleeding Genitourinary: No dysuria, hematuria, pyuria, incontinence, nocturia Dermatologic: No rash, pruritus,  change in appearance of skin Neurologic: No headache, syncope, seizures Psychiatric: No significant depression, insomnia, anorexia Endocrine: No change in hair/skin/nails, excessive thirst,  excessive hunger, excessive urination  Hematologic/lymphatic: No significant bruising, lymphadenopathy Allergy/immunology: No itchy/watery eyes, significant sneezing, urticaria, angioedema  Physical exam:  Pertinent or positive findings: She is hard of hearing.  She exhibits a slightly halting speech pattern.  Some temporal wasting is suggested.  Eyebrows are decreased laterally.  She has complete dentures.  Heart sounds are somewhat distant.  Pedal pulses are absent.  She has 1/2+ edema at the sock line.  She has hyperpigmentation over the shins which is nonblanching.  There is interosseous wasting of the hands.  Indeed the hands are cool to touch.  The left upper extremity is stronger to opposition than the right.  The lower extremities are stronger than the upper extremities to opposition.  General appearance: no acute distress, increased work of breathing is present.   Lymphatic: No lymphadenopathy about the head, neck, axilla. Eyes: No conjunctival inflammation or lid edema is present. There is no scleral icterus. Ears:  External ear exam shows no significant lesions or deformities.   Nose:  External nasal examination shows no deformity or inflammation. Nasal mucosa are pink and moist without lesions, exudates Neck:  No thyromegaly, masses, tenderness noted.    Heart:  No gallop, murmur, click, rub.  Lungs: Chest clear to auscultation without wheezes, rhonchi, rales, rubs. Abdomen: Bowel sounds are normal.  Abdomen is soft and nontender with no organomegaly, hernias, masses. GU: Deferred  Extremities:  No cyanosis, clubbing Neurologic exam: Balance, Rhomberg, finger to nose testing could not be completed due to clinical state Skin: Warm & dry w/o tenting.  See clinical summary under each active problem in the Problem List with associated updated therapeutic plan

## 2022-02-13 NOTE — Patient Instructions (Signed)
See assessment and plan under each diagnosis in the problem list and acutely for this visit 

## 2022-02-13 NOTE — Assessment & Plan Note (Signed)
No bleeding dyscrasias at the SNF.  She does continue to have chronic dysphagia.

## 2022-02-13 NOTE — Assessment & Plan Note (Signed)
She remains on maintenance tamoxifen.

## 2022-02-13 NOTE — Assessment & Plan Note (Signed)
B12 level will need to be rechecked in 6-8 weeks to verify adequate absorption

## 2022-02-14 DIAGNOSIS — R296 Repeated falls: Secondary | ICD-10-CM | POA: Insufficient documentation

## 2022-02-14 NOTE — Assessment & Plan Note (Addendum)
Discuss D/C of Zolpidem, which is on the [place] List, in lieu of melatonin.Of concern is alcohol consumption @ night as well. Xanax use & response will be monitored.

## 2022-02-19 ENCOUNTER — Other Ambulatory Visit (HOSPITAL_COMMUNITY)
Admission: RE | Admit: 2022-02-19 | Discharge: 2022-02-19 | Disposition: A | Discharge status: Home / Self Care | Payer: Medicare Other | Source: Skilled Nursing Facility | Attending: Adult Health | Admitting: Adult Health

## 2022-02-19 DIAGNOSIS — I10 Essential (primary) hypertension: Secondary | ICD-10-CM | POA: Insufficient documentation

## 2022-02-19 LAB — CBC
HCT: 32 % — ABNORMAL LOW (ref 36.0–46.0)
Hemoglobin: 10.1 g/dL — ABNORMAL LOW (ref 12.0–15.0)
MCH: 30.1 pg (ref 26.0–34.0)
MCHC: 31.6 g/dL (ref 30.0–36.0)
MCV: 95.2 fL (ref 80.0–100.0)
Platelets: 356 10*3/uL (ref 150–400)
RBC: 3.36 MIL/uL — ABNORMAL LOW (ref 3.87–5.11)
RDW: 15.2 % (ref 11.5–15.5)
WBC: 4.5 10*3/uL (ref 4.0–10.5)
nRBC: 0 % (ref 0.0–0.2)

## 2022-02-19 LAB — BASIC METABOLIC PANEL
Anion gap: 5 (ref 5–15)
BUN: 6 mg/dL — ABNORMAL LOW (ref 8–23)
CO2: 24 mmol/L (ref 22–32)
Calcium: 8.4 mg/dL — ABNORMAL LOW (ref 8.9–10.3)
Chloride: 105 mmol/L (ref 98–111)
Creatinine, Ser: 0.99 mg/dL (ref 0.44–1.00)
GFR, Estimated: 57 mL/min — ABNORMAL LOW (ref >60)
Glucose, Bld: 91 mg/dL (ref 70–99)
Potassium: 3.9 mmol/L (ref 3.5–5.1)
Sodium: 134 mmol/L — ABNORMAL LOW (ref 135–145)

## 2022-02-26 ENCOUNTER — Encounter: Payer: Self-pay | Admitting: Adult Health

## 2022-02-26 ENCOUNTER — Other Ambulatory Visit: Payer: Self-pay | Admitting: Adult Health

## 2022-02-26 ENCOUNTER — Non-Acute Institutional Stay (SKILLED_NURSING_FACILITY): Payer: Medicare Other | Admitting: Adult Health

## 2022-02-26 DIAGNOSIS — K921 Melena: Secondary | ICD-10-CM

## 2022-02-26 DIAGNOSIS — D500 Iron deficiency anemia secondary to blood loss (chronic): Secondary | ICD-10-CM

## 2022-02-26 DIAGNOSIS — F331 Major depressive disorder, recurrent, moderate: Secondary | ICD-10-CM

## 2022-02-26 MED ORDER — PANTOPRAZOLE SODIUM 40 MG PO TBEC: 40.0000 mg | DELAYED_RELEASE_TABLET | Freq: Two times a day (BID) | ORAL | 0 refills | Status: DC

## 2022-02-26 MED ORDER — FERROUS SULFATE 325 (65 FE) MG PO TABS: 325.0000 mg | ORAL_TABLET | Freq: Every day | ORAL | 0 refills | Status: DC

## 2022-02-26 MED ORDER — FUROSEMIDE 20 MG PO TABS: 20.0000 mg | ORAL_TABLET | Freq: Every day | ORAL | 0 refills | Status: DC | PRN

## 2022-02-26 MED ORDER — ARIPIPRAZOLE 2 MG PO TABS: 2.0000 mg | ORAL_TABLET | Freq: Every day | ORAL | 0 refills | Status: DC

## 2022-02-26 MED ORDER — DULOXETINE HCL 60 MG PO CPEP: 120.0000 mg | ORAL_CAPSULE | Freq: Every day | ORAL | 0 refills | Status: DC

## 2022-02-26 MED ORDER — FOLIC ACID 1 MG PO TABS: 1.0000 mg | ORAL_TABLET | Freq: Every day | ORAL | 0 refills | Status: DC

## 2022-02-26 MED ORDER — ZOLPIDEM TARTRATE 5 MG PO TABS: 5.0000 mg | ORAL_TABLET | Freq: Every day | ORAL | 0 refills | Status: DC

## 2022-02-26 MED ORDER — TAMOXIFEN CITRATE 20 MG PO TABS: 10.0000 mg | ORAL_TABLET | ORAL | 0 refills | Status: DC

## 2022-02-26 MED ORDER — SUCRALFATE 1 GM/10ML PO SUSP: 1.0000 g | Freq: Two times a day (BID) | ORAL | 0 refills | Status: DC

## 2022-02-26 NOTE — Progress Notes (Signed)
Location:  [facility] Room Number: 956 Place of Service:  SNF ([phone])  Provider: Ok Edwards np   PCP: Redmond School, MD Patient Care Team: Redmond School, MD as PCP - General (Internal Medicine) Satira Sark, MD as PCP - Cardiology (Cardiology) Magrinat, Virgie Dad, MD (Inactive) as Consulting Physician (Oncology) Fanny Skates, MD as Consulting Physician (General Surgery) Kyung Rudd, MD as Consulting Physician (Radiation Oncology) Rogene Houston, MD as Consulting Physician (Gastroenterology) Gaynelle Arabian, MD as Consulting Physician (Orthopedic Surgery) Delice Bison Charlestine Massed, NP as Nurse Practitioner (Hematology and Oncology) Jonnie Kind, MD (Inactive) as Consulting Physician (Obstetrics and Gynecology)  Extended Emergency Contact Information Primary Emergency Contact: [place], [place] [address] Johnnette Litter of [place] Phone: [phone] Relation: Son Secondary Emergency Contact: Len Blalock [place] Phone: [phone] Relation: Son  Code Status: full  Goals of care:  Advanced Directive information    02/26/2022   11:52 AM  Advanced Directives  Does Patient Have a Medical Advance Directive? No     Allergies  Allergen Reactions   Avelox [Moxifloxacin Hcl In Nacl] Other (See Comments)    Altered mental status    Chief Complaint  Patient presents with   Discharge Note    Discharge   Immunizations    COVID vaccine, shingrix vaccine, pneumonia vaccine and flu vaccine due    HPI:  81 y.o. female  being discharged to home with home health. She will need her prescriptions written; follow up with her medical provider. She does not need any dme. She was hospitalized for a fall at home; found to have hgb of 6.7. she did receive 2 units of packed cells. She was admitted to this facility for therapy. She has participated in pt/ot to improve upon her level of independence with her  adls. She is now ready for discharge to home.     Past Medical History:  Diagnosis Date   Anemia    Anxiety    Arthritis    Breast cancer ([place]) 05/19/2016   Right breast   Chronic back pain    Scoliosis, stenosis   Depression    Diverticulosis    GERD (gastroesophageal reflux disease)    Hard of hearing    History of blood transfusion    History of colon polyps    History of hiatal hernia    History of pneumonia    Hypertension    Insomnia    Osteopenia    Osteoporosis    Personal history of radiation therapy    Thickened endometrium 09/29/2017    Past Surgical History:  Procedure Laterality Date   BIOPSY  02/07/2022   Procedure: BIOPSY;  Surgeon: Harvel Quale, MD;  [facility];  Service: Gastroenterology;;   BREAST LUMPECTOMY WITH RADIOACTIVE SEED AND SENTINEL LYMPH NODE BIOPSY Right 07/31/2016   Procedure: BREAST LUMPECTOMY WITH RADIOACTIVE SEED AND SENTINEL LYMPH NODE BIOPSY, INJECT BLUE DYE RIGHT BREAST;  Surgeon: Fanny Skates, MD;  Location: [facility];  Service: General;  Laterality: Right;   BREAST LUMPECTOMY WITH RADIOACTIVE SEED LOCALIZATION Left 05/07/2021   Procedure: LEFT BREAST LUMPECTOMY WITH RADIOACTIVE SEED LOCALIZATION;  Surgeon: Erroll Luna, MD;  Location: [facility];  Service: General;  Laterality: Left;   COLONOSCOPY     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 07/04/2014   Procedure: LEFT CONVERSION TO TOTAL HIP ARTHROPLASTY;  Surgeon: Gaynelle Arabian, MD;  [facility];  Service: Orthopedics;  Laterality: Left;   ESOPHAGEAL DILATION N/A 09/24/2017   Procedure: ESOPHAGEAL DILATION;  Surgeon: Rogene Houston, MD;  [facility];  Service: Endoscopy;  Laterality: N/A;   ESOPHAGEAL DILATION N/A 01/19/2020   Procedure: ESOPHAGEAL DILATION;  Surgeon: Harvel Quale, MD;  [facility];  Service: Gastroenterology;  Laterality: N/A;   ESOPHAGEAL DILATION  02/28/2020   Procedure: ESOPHAGEAL DILATION;   Surgeon: Rogene Houston, MD;  [facility];  Service: Endoscopy;;   ESOPHAGOGASTRODUODENOSCOPY N/A 01/26/2014   Procedure: ESOPHAGOGASTRODUODENOSCOPY (EGD);  Surgeon: Rogene Houston, MD;  [facility];  Service: Endoscopy;  Laterality: N/A;  1030   ESOPHAGOGASTRODUODENOSCOPY (EGD) WITH PROPOFOL N/A 09/24/2017   Procedure: ESOPHAGOGASTRODUODENOSCOPY (EGD) WITH PROPOFOL;  Surgeon: Rogene Houston, MD;  [facility];  Service: Endoscopy;  Laterality: N/A;  11:15   ESOPHAGOGASTRODUODENOSCOPY (EGD) WITH PROPOFOL N/A 01/19/2020   Procedure: ESOPHAGOGASTRODUODENOSCOPY (EGD) WITH PROPOFOL;  Surgeon: Harvel Quale, MD;  [facility];  Service: Gastroenterology;  Laterality: N/A;  1200   ESOPHAGOGASTRODUODENOSCOPY (EGD) WITH PROPOFOL N/A 02/28/2020   Procedure: ESOPHAGOGASTRODUODENOSCOPY (EGD) WITH PROPOFOL;  Surgeon: Rogene Houston, MD;  [facility];  Service: Endoscopy;  Laterality: N/A;  2:45   ESOPHAGOGASTRODUODENOSCOPY (EGD) WITH PROPOFOL N/A 02/07/2022   Procedure: ESOPHAGOGASTRODUODENOSCOPY (EGD) WITH PROPOFOL;  Surgeon: Harvel Quale, MD;  [facility];  Service: Gastroenterology;  Laterality: N/A;   HIP FRACTURE SURGERY     05/2009 left -[facility]   HYSTEROSCOPY WITH D & C  05/24/2018   Procedure: DILATATION AND CURETTAGE /HYSTEROSCOPY (PROCEDURE # 2)/PAP SMEAR (PROCEDURE #1);  Surgeon: Jonnie Kind, MD;  Location[facility];  Service: Gynecology;;   Venia Minks DILATION N/A 01/26/2014   Procedure: Keturah Shavers;  Surgeon: Rogene Houston, MD;  [facility];  Service: Endoscopy;  Laterality: N/A;      reports that she quit smoking about 61 years ago. Her smoking use included cigarettes. She has a 1.00 pack-year smoking history. She has never been exposed to tobacco smoke. She has never used smokeless tobacco. She reports current alcohol use of about 15.0 standard drinks of alcohol per week. She  reports that she does not use drugs. Social History   Socioeconomic History   Marital status: Married    Spouse name: Not on file   Number of children: 3   Years of education: Not on file   Highest education level: Not on file  Occupational History    Employer: RETIRED  Tobacco Use   Smoking status: Former    Packs/day: 0.50    Years: 2.00    Total pack years: 1.00    Types: Cigarettes    Quit date: 01/15/1961    Years since quitting: 61.1    Passive exposure: Never   Smokeless tobacco: Never  Vaping Use   Vaping Use: Never used  Substance and Sexual Activity   Alcohol use: Yes    Alcohol/week: 15.0 standard drinks of alcohol    Types: 15 Cans of beer per week    Comment: 4 "light" beers daily   Drug use: No   Sexual activity: Not Currently    Birth control/protection: Post-menopausal  Other Topics Concern   Not on file  Social History Narrative   Not on file   Social Determinants of Health   Financial Resource Strain: Not on file  Food Insecurity: No Food Insecurity (02/06/2022)   Hunger Vital Sign  Worried About Charity fundraiser in the Last Year: Never true    [place] in the Last Year: Never true  Transportation Needs: No Transportation Needs (02/06/2022)   PRAPARE - Hydrologist (Medical): No    Lack of Transportation (Non-Medical): No  Physical Activity: Not on file  Stress: Not on file  Social Connections: Not on file  Intimate Partner Violence: Not At Risk (02/06/2022)   Humiliation, Afraid, Rape, and Kick questionnaire    Fear of Current or Ex-Partner: No    Emotionally Abused: No    Physically Abused: No    Sexually Abused: No   Functional Status Survey:    Allergies  Allergen Reactions   Avelox [Moxifloxacin Hcl In Nacl] Other (See Comments)    Altered mental status    Pertinent  Health Maintenance Due  Topic Date Due   INFLUENZA VACCINE  10/14/2021   DEXA SCAN  Completed     Medications: Outpatient Encounter Medications as of 02/26/2022  Medication Sig   acetaminophen (TYLENOL) 325 MG tablet Take 650 mg by mouth every 6 (six) hours as needed.   ARIPiprazole (ABILIFY) 2 MG tablet Take 1 tablet (2 mg total) by mouth daily.   Cholecalciferol (VITAMIN D3) 125 MCG (5000 UT) CAPS Take 1 capsule by mouth daily.   cyanocobalamin 1000 MCG tablet Take 1 tablet (1,000 mcg total) by mouth daily.   DULoxetine (CYMBALTA) 60 MG capsule Take 2 capsules (120 mg total) by mouth daily.   ferrous sulfate 325 (65 FE) MG tablet Take 1 tablet (325 mg total) by mouth daily with breakfast.   folic acid (FOLVITE) 1 MG tablet Take 1 tablet (1 mg total) by mouth daily.   furosemide (LASIX) 20 MG tablet Take 1 tablet (20 mg total) by mouth daily as needed for fluid.   pantoprazole (PROTONIX) 40 MG tablet Take 1 tablet (40 mg total) by mouth 2 (two) times daily.   sucralfate (CARAFATE) 1 GM/10ML suspension Take 10 mLs (1 g total) by mouth 2 (two) times daily.   tamoxifen (NOLVADEX) 20 MG tablet Take 0.5 tablets (10 mg total) by mouth every other day.   thiamine (VITAMIN B-1) 100 MG tablet Take 1 tablet (100 mg total) by mouth daily.   zinc gluconate 50 MG tablet Take 50 mg by mouth daily.   zolpidem (AMBIEN) 5 MG tablet Take 1 tablet (5 mg total) by mouth at bedtime.   No facility-administered encounter medications on file as of 02/26/2022.      Vitals:   02/26/22 1134  BP: (!) 146/77  Pulse: 89  Resp: 20  Temp: 97.6 F (36.4 C)  SpO2: 97%  Weight: 154 lb 12.8 oz (70.2 kg)  Height: '5\' 7"'$  (1.702 m)   Body mass index is 24.25 kg/m.   PREVIOUS   02-07-22: upper GI Benign-appearing esophageal stenosis. Not dilated - non critical. - Large hiatal hernia with five Cameron ulcers. - Normal stomach. Biopsied. - Normal examined duodenum.  NO NEW EXAMS   LABS REVIEWED: PREVIOUS   02-06-22: wbc 7.8; hgb 6.7; hct 20.9; mcv 994.1 plt 309; glucose 142; bun 46; creat 1.06; k+  4.6; na++ 127; ca 8.3; gfr 52; protein 6.1; albumin 3.1 02-09-22: wbc 6.8; hgb 7.1; hct 21.5; mcv 91.5 plt 194; iron 20; tibc 308; ferritin 14; vitamin B12; 111; folate 12.2 02-11-22: wbc 6.7; hgb 8.9; hct 27.2; mcv 92.2 plt 229; glucose 94; bun 7; creat 0.96; k+ 3.9; na++ 128; ca 8.0;  gfr 59; tsh 5.217; cortisol 12.6  NO NEW LABS.   Review of Systems  Constitutional:  Negative for malaise/fatigue.  Respiratory:  Negative for cough and shortness of breath.   Cardiovascular:  Negative for chest pain, palpitations and leg swelling.  Gastrointestinal:  Negative for abdominal pain, constipation and heartburn.  Musculoskeletal:  Negative for back pain, joint pain and myalgias.  Skin: Negative.   Neurological:  Negative for dizziness.  Psychiatric/Behavioral:  The patient is not nervous/anxious.     Physical Exam Constitutional:      General: She is not in acute distress.    Appearance: She is well-developed. She is not diaphoretic.  Neck:     Thyroid: No thyromegaly.  Cardiovascular:     Rate and Rhythm: Normal rate and regular rhythm.     Pulses: Normal pulses.     Heart sounds: Murmur heard.  Pulmonary:     Effort: Pulmonary effort is normal. No respiratory distress.     Breath sounds: Normal breath sounds.  Abdominal:     General: Bowel sounds are normal. There is no distension.     Palpations: Abdomen is soft.     Tenderness: There is no abdominal tenderness.  Musculoskeletal:        General: Normal range of motion.     Cervical back: Neck supple.     Right lower leg: No edema.     Left lower leg: No edema.  Lymphadenopathy:     Cervical: No cervical adenopathy.  Skin:    General: Skin is warm and dry.  Neurological:     Mental Status: She is alert and oriented to person, place, and time.       Assessment/Plan:    Patient is being discharged with the following home health services:  pt/ot to evaluate and treat as indicated for gait balance strength adl training.    Patient is being discharged with the following durable medical equipment:  none needed   Patient has been advised to f/u with their PCP in 1-2 weeks to for a transitions of care visit.  Social services at their facility was responsible for arranging this appointment.  Pt was provided with adequate prescriptions of noncontrolled medications to reach the scheduled appointment .  For controlled substances, a limited supply was provided as appropriate for the individual patient.  If the pt normally receives these medications from a pain clinic or has a contract with another physician, these medications should be received from that clinic or physician only).    A 30 day supply of her prescription medications have been sent to [facility] [address]  Time spent with patient: 35 minutes: medications; home health; dme  Ok Edwards NP [facility]   call [phone]

## 2022-02-27 ENCOUNTER — Other Ambulatory Visit: Payer: Self-pay | Admitting: *Deleted

## 2022-02-27 DIAGNOSIS — I10 Essential (primary) hypertension: Secondary | ICD-10-CM

## 2022-02-27 NOTE — Patient Outreach (Signed)
[place] Coordinator follow up. Screening for potential [facility] care coordination services as benefit as insurance plan and PCP.   Verified with [facility] social worker [name] will transition home today 02/27/22. She will have [facility]. SNF social worker previously indicated [facility] follow up will be beneficial. [name] lives alone. Has history of recent fall at home, HTN, GI bleed, GERD.  Will make referral to [facility] care coordination team.   Marilyn Rolling, MSN, RN,BSN [facility] Coordinator [phone] (Direct dial)

## 2022-03-02 ENCOUNTER — Telehealth: Payer: Self-pay | Admitting: *Deleted

## 2022-03-02 NOTE — Progress Notes (Signed)
  Care Coordination  Outreach Note  03/02/2022 Name: SYANNA REMMERT MRN: [id number] DOB: [birth year]   Care Coordination Outreach Attempts: An unsuccessful telephone outreach was attempted today to offer the patient information about available care coordination services as a benefit of their health plan.   Follow Up Plan:  Additional outreach attempts will be made to offer the patient care coordination information and services.   Encounter Outcome:  No Answer  [place]  Direct Dial: [phone]

## 2022-03-02 NOTE — Progress Notes (Unsigned)
GI Office Note    Referring Provider: Redmond School, MD Primary Care Physician:  Redmond School, MD Primary Gastroenterologist: Harvel Quale, MD  Date:  03/04/2022  ID:  Marilyn, Haas [birth year], MRN [id number]   Chief Complaint   Chief Complaint  Patient presents with   [facility] follow up.     History of Present Illness  Marilyn Haas is a 81 y.o. female with a history of breast cancer, depression, scoliosis, GERD, HTN, anxiety, arthritis, and constipation presenting today for hospital follow-up.  EGD 02/28/2020: -Prominent esophageal web s/p dilation with 46 French Maloney dilator -Schatzki's ring dilated with 18 mm balloon  Last office visit completed in January 2022 via telephone.  Denied any issues with dysphagia.  Not having any reflux on omeprazole 40 mg twice daily.  She did report issues with constipation not improved with over-the-counter laxatives (Colace and MiraLAX daily).  Reportedly taking one half pain medications 3 times daily for chronic back pain.  Advised to reduce omeprazole from 40 mg twice daily to 20 mg twice daily.  Advised to stop Colace and MiraLAX daily and start Linzess 145 mcg daily.  Most recently seen inpatient 02/07/2022 by Dr. Jenetta Downer for severe anemia.  She was noted to be poor historian with complaint of worsening fatigue and lightheadedness for 24 hours which led to syncopal episode.  Noted to have intermittent episodes of lightheadedness for few months and intermittent shortness of breath.  She denied melena, BRBPR, hematemesis, abdominal pain, nausea, vomiting.  Patient suspected that she was taking ibuprofen intermittently for pain.  Denied any anticoagulant use.  She had hemoglobin 6.7, MCV 94, sodium 127, creatinine 1.06, BUN 46 with normal LFTs and positive fecal occult blood test.  She received 1 unit PRBC with improvement to hemoglobin 7.8.  She was started on PPI twice daily and underwent EGD  02/07/2022 with benign-appearing esophageal stenosis, not dilated, large hiatal hernia with 5 Cameron ulcers, normal stomach s/p biopsy, normal duodenum.  Given recommendations to use omeprazole 40 mg twice daily on discharge, Carafate 1 g every 12 hours for 1 month, oral ferrous sulfate 325 mg daily and repeat CBC in 2 weeks. (Discussed that if worsening reoccurring anemia, she will need referral for surgical hernia repair).  CBC 02/19/2022 with hemoglobin 10.1.  Today:  Using a cane or walker due to instability. Still having some lightheadedness. Supposed to be taking a PPI twice daily. Denies any issues with dysphagia. May have a BM once or twice a week. Prior to hospitalization she was having uncontrolled diarrhea. Currently having to strain. Does have some lower abdominal pain and improves after defecation. Is taking a stool softener everyday but not  miralax. Has been taking some dulcolax when she can't go.   Denies reflux, dysphagia, nausea, vomiting, chest pain, shortness of breath.    Current Outpatient Medications  Medication Sig Dispense Refill   ALPRAZolam (XANAX) 0.25 MG tablet Take 0.25 mg by mouth at bedtime as needed for anxiety.     ARIPiprazole (ABILIFY) 2 MG tablet Take 1 tablet (2 mg total) by mouth daily. 30 tablet 0   DULoxetine (CYMBALTA) 60 MG capsule Take 2 capsules (120 mg total) by mouth daily. 60 capsule 0   tamoxifen (NOLVADEX) 20 MG tablet Take 0.5 tablets (10 mg total) by mouth every other day. 15 tablet 0   acetaminophen (TYLENOL) 325 MG tablet Take 650 mg by mouth every 6 (six) hours as needed.     Cholecalciferol (VITAMIN  D3) 125 MCG (5000 UT) CAPS Take 1 capsule by mouth daily. (Patient not taking: Reported on 03/04/2022)     cyanocobalamin 1000 MCG tablet Take 1 tablet (1,000 mcg total) by mouth daily.     ferrous sulfate 325 (65 FE) MG tablet Take 1 tablet (325 mg total) by mouth daily with breakfast. (Patient not taking: Reported on 03/04/2022) 30 tablet 0    folic acid (FOLVITE) 1 MG tablet Take 1 tablet (1 mg total) by mouth daily. 30 tablet 0   furosemide (LASIX) 20 MG tablet Take 1 tablet (20 mg total) by mouth daily as needed for fluid. (Patient not taking: Reported on 03/04/2022) 30 tablet 0   pantoprazole (PROTONIX) 40 MG tablet Take 1 tablet (40 mg total) by mouth 2 (two) times daily. 60 tablet 6   thiamine (VITAMIN B-1) 100 MG tablet Take 1 tablet (100 mg total) by mouth daily. (Patient not taking: Reported on 03/04/2022)     zolpidem (AMBIEN) 5 MG tablet Take 1 tablet (5 mg total) by mouth at bedtime. (Patient not taking: Reported on 03/04/2022) 30 tablet 0   No current facility-administered medications for this visit.    Past Medical History:  Diagnosis Date   Anemia    Anxiety    Arthritis    Breast cancer ([place]) 05/19/2016   Right breast   Chronic back pain    Scoliosis, stenosis   Depression    Diverticulosis    GERD (gastroesophageal reflux disease)    Hard of hearing    History of blood transfusion    History of colon polyps    History of hiatal hernia    History of pneumonia    Hypertension    Insomnia    Osteopenia    Osteoporosis    Personal history of radiation therapy    Thickened endometrium 09/29/2017    Past Surgical History:  Procedure Laterality Date   BIOPSY  02/07/2022   Procedure: BIOPSY;  Surgeon: Harvel Quale, MD;  [facility];  Service: Gastroenterology;;   BREAST LUMPECTOMY WITH RADIOACTIVE SEED AND SENTINEL LYMPH NODE BIOPSY Right 07/31/2016   Procedure: BREAST LUMPECTOMY WITH RADIOACTIVE SEED AND SENTINEL LYMPH NODE BIOPSY, INJECT BLUE DYE RIGHT BREAST;  Surgeon: Fanny Skates, MD;  Location: [facility];  Service: General;  Laterality: Right;   BREAST LUMPECTOMY WITH RADIOACTIVE SEED LOCALIZATION Left 05/07/2021   Procedure: LEFT BREAST LUMPECTOMY WITH RADIOACTIVE SEED LOCALIZATION;  Surgeon: Erroll Luna, MD;  Location: [place] [facility];  Service: General;   Laterality: Left;   COLONOSCOPY     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 07/04/2014   Procedure: LEFT CONVERSION TO TOTAL HIP ARTHROPLASTY;  Surgeon: Gaynelle Arabian, MD;  Location: [facility];  Service: Orthopedics;  Laterality: Left;   ESOPHAGEAL DILATION N/A 09/24/2017   Procedure: ESOPHAGEAL DILATION;  Surgeon: Rogene Houston, MD;  [facility];  Service: Endoscopy;  Laterality: N/A;   ESOPHAGEAL DILATION N/A 01/19/2020   Procedure: ESOPHAGEAL DILATION;  Surgeon: Harvel Quale, MD;  [facility];  Service: Gastroenterology;  Laterality: N/A;   ESOPHAGEAL DILATION  02/28/2020   Procedure: ESOPHAGEAL DILATION;  Surgeon: Rogene Houston, MD;  [facility];  Service: Endoscopy;;   ESOPHAGOGASTRODUODENOSCOPY N/A 01/26/2014   Procedure: ESOPHAGOGASTRODUODENOSCOPY (EGD);  Surgeon: Rogene Houston, MD;  [facility];  Service: Endoscopy;  Laterality: N/A;  1030   ESOPHAGOGASTRODUODENOSCOPY (EGD) WITH PROPOFOL N/A 09/24/2017   Procedure: ESOPHAGOGASTRODUODENOSCOPY (EGD) WITH PROPOFOL;  Surgeon: Hildred Laser  U, MD;  [facility];  Service: Endoscopy;  Laterality: N/A;  11:15   ESOPHAGOGASTRODUODENOSCOPY (EGD) WITH PROPOFOL N/A 01/19/2020   Procedure: ESOPHAGOGASTRODUODENOSCOPY (EGD) WITH PROPOFOL;  Surgeon: Harvel Quale, MD;  [facility];  Service: Gastroenterology;  Laterality: N/A;  1200   ESOPHAGOGASTRODUODENOSCOPY (EGD) WITH PROPOFOL N/A 02/28/2020   Procedure: ESOPHAGOGASTRODUODENOSCOPY (EGD) WITH PROPOFOL;  Surgeon: Rogene Houston, MD;  [facility];  Service: Endoscopy;  Laterality: N/A;  2:45   ESOPHAGOGASTRODUODENOSCOPY (EGD) WITH PROPOFOL N/A 02/07/2022   Procedure: ESOPHAGOGASTRODUODENOSCOPY (EGD) WITH PROPOFOL;  Surgeon: Harvel Quale, MD;  [facility];  Service: Gastroenterology;  Laterality: N/A;   HIP FRACTURE SURGERY     05/2009 left -[place]   HYSTEROSCOPY WITH D & C   05/24/2018   Procedure: DILATATION AND CURETTAGE /HYSTEROSCOPY (PROCEDURE # 2)/PAP SMEAR (PROCEDURE #1);  Surgeon: Jonnie Kind, MD;  Location[facility];  Service: Gynecology;;   Venia Minks DILATION N/A 01/26/2014   Procedure: Keturah Shavers;  Surgeon: Rogene Houston, MD;  [facility];  Service: Endoscopy;  Laterality: N/A;    Family History  Problem Relation Age of Onset   Heart failure Mother    Diabetes Mother    Hypertension Mother    Depression Mother    Other Father        stomach ulcers; abd aneursym   Hypertension Sister    Heart failure Sister    Diabetes Sister    Depression Sister    Diabetes Sister        prediabetes   Cancer Maternal Aunt        breast    Allergies as of 03/04/2022 - Review Complete 03/04/2022  Allergen Reaction Noted   Avelox [moxifloxacin hcl in nacl] Other (See Comments) 05/25/2011    Social History   Socioeconomic History   Marital status: Married    Spouse name: Not on file   Number of children: 3   Years of education: Not on file   Highest education level: Not on file  Occupational History    Employer: RETIRED  Tobacco Use   Smoking status: Former    Packs/day: 0.50    Years: 2.00    Total pack years: 1.00    Types: Cigarettes    Quit date: 01/15/1961    Years since quitting: 61.1    Passive exposure: Never   Smokeless tobacco: Never  Vaping Use   Vaping Use: Never used  Substance and Sexual Activity   Alcohol use: Yes    Alcohol/week: 15.0 standard drinks of alcohol    Types: 15 Cans of beer per week    Comment: 4 "light" beers daily   Drug use: No   Sexual activity: Not Currently    Birth control/protection: Post-menopausal  Other Topics Concern   Not on file  Social History Narrative   Not on file   Social Determinants of Health   Financial Resource Strain: Not on file  Food Insecurity: No Food Insecurity (02/06/2022)   Hunger Vital Sign    Worried About Running Out of Food in the Last Year: Never  true    Ran Out of Food in the Last Year: Never true  Transportation Needs: No Transportation Needs (02/06/2022)   PRAPARE - Hydrologist (Medical): No    Lack of Transportation (Non-Medical): No  Physical Activity: Not on file  Stress: Not on file  Social Connections: Not on file  Review of Systems   Gen: Denies fever, chills, anorexia. Denies fatigue, weakness, weight loss.  CV: Denies chest pain, palpitations, syncope, peripheral edema, and claudication. Resp: Denies dyspnea at rest, cough, wheezing, coughing up blood, and pleurisy. GI: See HPI Derm: Denies rash, itching, dry skin Psych: Denies depression, anxiety, memory loss, confusion. No homicidal or suicidal ideation.  Heme: Denies bruising, bleeding, and enlarged lymph nodes.   Physical Exam   BP (!) 159/83 (BP Location: Left Arm, Patient Position: Sitting, Cuff Size: Normal)   Pulse 97   Temp 98.1 F (36.7 C) (Temporal)   Ht '5\' 5"'$  (1.651 m)   Wt 151 lb (68.5 kg)   SpO2 98%   BMI 25.13 kg/m   General:   Alert and oriented. No distress noted. Pleasant and cooperative.  Head:  Normocephalic and atraumatic. Eyes:  Conjuctiva clear without scleral icterus. Mouth:  Oral mucosa pink and moist. Good dentition. No lesions. Lungs:  Clear to auscultation bilaterally. No wheezes, rales, or rhonchi. No distress.  Heart:  S1, S2 present without murmurs appreciated.  Abdomen:  +BS, soft, non-distended. Mild ttp to epigastrium and lower abdomen. No rebound or guarding. No HSM or masses noted. Rectal: deferred Msk:  Symmetrical without gross deformities. Normal posture. Extremities:  Without edema. Neurologic:  Alert and  oriented x3 Psych:  Alert and cooperative. Normal mood and affect.   Assessment  Marilyn Haas is a 81 y.o. female with a history of breast cancer, depression, scoliosis, GERD, HTN, anxiety, arthritis, and constipation presenting today for hospital follow-up.  Anemia:  Recent hospitalization with hemoglobin 6.7 on admission.  Underwent EGD 02/07/2022 noting a large hiatal hernia with 5 Cameron ulcers.  She was started on oral ferrous sulfate 325 mg every day.  Advised omeprazole 40 mg twice daily on discharge and super fate 1 g twice daily for 1 month.  Hemoglobin after discharge improved to 10.1.  Still has some fatigue.  Using a cane or walker at home mostly.  Denies any melena or BRBPR.  No hematemesis.  Unsure if she is taking daily iron supplement.  I recommended for her to continue this.  Plan to recheck CBC at follow-up.  GERD: History of GERD, previously maintained on omeprazole 20 mg twice daily.  Has since been on pantoprazole 40 mg twice daily since hospitalization given evidence of Cameron erosions on EGD as outlined above.  GERD diet societal modifications reinforced.  Advised to continue pantoprazole 40 mg twice daily and stop Carafate.  Denies any dysphagia, nausea, vomiting, shortness of breath.  Advised if any of the symptoms recur or significant anemia that we would need to consider referral for hiatal hernia repair.  Constipation: Having a bowel movement about once or twice a week.  Does have to strain.  Denies any BRBPR.  Has some occasional intermittent lower abdominal pain.  Currently only taking a stool softener daily.  Advise she may take this up to 3 times a day if needed.  But would start with adding MiraLAX 17 g nightly and increase to twice daily if needed.  Also advised a daily fiber supplementation.  The patient was found to have elevated blood pressure when vital signs were checked in the office. The blood pressure was rechecked by the nursing staff and it was found be persistently elevated >140/90 mmHg. I personally advised to the patient to follow up closely with PCP for hypertension control.  PLAN   Continue pantoprazole 40 mg twice daily GERD diet/lifestyle modifications Start miralax 17g nightly.  Continue stool softener daily, may  increase up to 3 times per day.  Stop carafate Continue daily iron supplement. May plan to recheck hemoglobin at next office visit. Follow-up with PCP for blood pressure management Follow up in 4 months.     Venetia Night, MSN, FNP-BC, AGACNP-BC [facility]  I have reviewed the note and agree with the APP's assessment as described in this progress note  Maylon Peppers, MD Gastroenterology and [facility]

## 2022-03-04 ENCOUNTER — Encounter: Payer: Self-pay | Admitting: Gastroenterology

## 2022-03-04 ENCOUNTER — Ambulatory Visit (INDEPENDENT_AMBULATORY_CARE_PROVIDER_SITE_OTHER): Payer: Medicare Other | Admitting: Gastroenterology

## 2022-03-04 VITALS — BP 159/83 | HR 97 | Temp 98.1°F | Ht 65.0 in | Wt 151.0 lb

## 2022-03-04 DIAGNOSIS — D500 Iron deficiency anemia secondary to blood loss (chronic): Secondary | ICD-10-CM | POA: Diagnosis not present

## 2022-03-04 DIAGNOSIS — K5900 Constipation, unspecified: Secondary | ICD-10-CM | POA: Diagnosis not present

## 2022-03-04 DIAGNOSIS — K219 Gastro-esophageal reflux disease without esophagitis: Secondary | ICD-10-CM | POA: Diagnosis not present

## 2022-03-04 MED ORDER — PANTOPRAZOLE SODIUM 40 MG PO TBEC: 40.0000 mg | DELAYED_RELEASE_TABLET | Freq: Two times a day (BID) | ORAL | 6 refills | Status: DC

## 2022-03-04 NOTE — Patient Instructions (Addendum)
If your blood pressure remains elevated above 140/90, you should follow-up with your primary care physician for management.   Please begin taking MiraLAX 17 g (1 capful) nightly.  If needed you may increase to twice daily, once in the morning and once at night. Continue taking daily stool softener. If combination of MiraLAX nightly and daily stool softener is not helpful producing more bowel movements, you may increase your stool softener to twice daily, no more than 3 times daily.  Next dose of MiraLAX would be twice daily.  Addition of a daily fiber supplement could also help with your constipation.  I would recommend something like Benefiber.  Could be Gummies or the powder form.  I have sent a refill of your pantoprazole for you to take 40 mg twice daily.  Continue taking daily iron supplement.  If you begin to have any shortness of breath, hematemesis, lightheadedness or dizziness or feeling like you may pass out please please go to the ED.  If you notice a progressive shortness of breath please contact the office and we can send in referral to discuss hiatal hernia repair.  I hope you have a wonderful Christmas and a happy new year!  It was a pleasure to see you today. I want to create trusting relationships with patients. If you receive a survey regarding your visit,  I greatly appreciate you taking time to fill this out on paper or through your MyChart. I value your feedback.  Venetia Night, MSN, FNP-BC, AGACNP-BC [facility]

## 2022-03-11 NOTE — Progress Notes (Signed)
  Care Coordination   Note   03/11/2022 Name: Marilyn Haas MRN: [id number] DOB: 1941/02/23  Marilyn Haas is a 81 y.o. year old female who sees Redmond School, MD for primary care. I reached out to [name] by phone today to offer care coordination services.  [name] was given information about Care Coordination services today including:   The Care Coordination services include support from the care team which includes your Nurse Coordinator, Clinical Social Worker, or Pharmacist.  The Care Coordination team is here to help remove barriers to the health concerns and goals most important to you. Care Coordination services are voluntary, and the patient may decline or stop services at any time by request to their care team member.   Care Coordination Consent Status: Patient agreed to services and verbal consent obtained.   Follow up plan:  Telephone appointment with care coordination team member scheduled for:  03/17/22  Encounter Outcome:  Pt. Scheduled  [place]  Direct Dial: [phone]

## 2022-03-13 DIAGNOSIS — M150 Primary generalized (osteo)arthritis: Secondary | ICD-10-CM | POA: Diagnosis not present

## 2022-03-13 DIAGNOSIS — I10 Essential (primary) hypertension: Secondary | ICD-10-CM | POA: Diagnosis not present

## 2022-03-13 DIAGNOSIS — F5101 Primary insomnia: Secondary | ICD-10-CM | POA: Diagnosis not present

## 2022-03-13 DIAGNOSIS — F419 Anxiety disorder, unspecified: Secondary | ICD-10-CM | POA: Diagnosis not present

## 2022-03-13 DIAGNOSIS — K2100 Gastro-esophageal reflux disease with esophagitis, without bleeding: Secondary | ICD-10-CM | POA: Diagnosis not present

## 2022-03-13 DIAGNOSIS — K922 Gastrointestinal hemorrhage, unspecified: Secondary | ICD-10-CM | POA: Diagnosis not present

## 2022-03-13 DIAGNOSIS — K449 Diaphragmatic hernia without obstruction or gangrene: Secondary | ICD-10-CM | POA: Diagnosis not present

## 2022-03-13 DIAGNOSIS — G894 Chronic pain syndrome: Secondary | ICD-10-CM | POA: Diagnosis not present

## 2022-03-13 DIAGNOSIS — Z6824 Body mass index (BMI) 24.0-24.9, adult: Secondary | ICD-10-CM | POA: Diagnosis not present

## 2022-03-13 DIAGNOSIS — K921 Melena: Secondary | ICD-10-CM | POA: Diagnosis not present

## 2022-03-17 ENCOUNTER — Ambulatory Visit: Payer: Self-pay | Admitting: *Deleted

## 2022-03-17 ENCOUNTER — Encounter: Payer: Self-pay | Admitting: *Deleted

## 2022-03-17 NOTE — Patient Outreach (Signed)
Care Coordination   Initial Visit Note   03/17/2022 Name: Marilyn Haas MRN: [id number] DOB: 10/29/1940  Marilyn Haas is a 82 y.o. year old female who sees Redmond School, MD for primary care. I spoke with  Marilyn Haas and her son Marilyn Haas by phone today.  What matters to the patients health and wellness today?  Son would like for the patient to be more physically active and would like to discuss resources, advance directives, Medicaid    Goals Addressed             This Visit's Progress    Care Coordination Needs       Care Coordination Interventions: Talked with patient and son Marilyn Haas Assessed family/social support Marilyn Haas lives in [place] but comes up frequently to assist with appointments and other needs Son, Marilyn Haas, lives in [place] and also comes up to help with appointments when he can There may be some disagreement amongst the patient and her children about how best to manage her health Marilyn Haas has arranged for [employer] to come in daily Marilyn Haas also arranges transportation through [place] if they are unable to take her to appts Discussed mobility, physical activity, and ability to care for herself Per Marilyn Haas, she is inactive and has trouble walking to the end of the driveway and back. Has had PT in the past but didn't participate very well or continue exercises after therapy ended. He would like for her to be more physically active and understands that it would be beneficial to her long term health but patient is "stubborn". Expresses that there may be some mental health issues at play Evaluation of current treatment plan related to anemia and hernia and patient's adherence to plan as established by provider Provided education to patient and/or caregiver about advanced directives Reviewed medications with patient and discussed access and affordability Collaborated with Nat Christen, LCSW regarding need for POA/Advance Directives, Medicaid application, PCS, and mental  health needs Reviewed scheduled/upcoming provider appointments including 04/04/22 with cardiology Discussed plans with patient for ongoing care management follow up and provided patient with direct contact information for care management team Assessed social determinant of health barriers Provided patient/caregiver with information regarding Care Coordination Services: Services include support from the care team which includes your Nurse Coordinator, Clinical Social Worker, or Pharmacist.  The Care Coordination team is here to help remove barriers to the health concerns and goals most important to you. Care Coordination services are voluntary, and the patient may decline or stop services at any time by request to their care team member.  Patient agreed to services Provided with [facility] direct contact number [phone] and encouraged to reach out as needed Emailed son, Marilyn Haas, with [facility] and LCSW contact info as well as information on [place] Coordination Program Telephone visit scheduled with Nat Christen, LCSW [phone]) on 03/23/22         SDOH assessments and interventions completed:  Yes  SDOH Interventions Today    Flowsheet Row Most Recent Value  SDOH Interventions   Food Insecurity Interventions Intervention Not Indicated  [Has Meals on [place] Interventions Intervention Not Indicated  Transportation Interventions Intervention Not Indicated  Utilities Interventions Intervention Not Indicated  Physical Activity Interventions Patient Refused        Care Coordination Interventions:  Yes, provided   Follow up plan: Follow up call scheduled for LCSW on 03/23/22    Encounter Outcome:  Pt. Visit Completed   Chong Sicilian, BSN, RN-BC [place]  [place]: [phone] Main #: [phone]

## 2022-03-23 ENCOUNTER — Ambulatory Visit: Payer: Self-pay | Admitting: *Deleted

## 2022-03-23 ENCOUNTER — Encounter: Payer: Self-pay | Admitting: *Deleted

## 2022-03-23 NOTE — Patient Outreach (Signed)
Care Coordination   Initial Visit Note   03/23/2022  Name: Marilyn Haas MRN: [id number] DOB: 1941-01-26  Marilyn Haas is a 82 y.o. year old female who sees Redmond School, MD for primary care. I spoke with patient's son, Carley Glendenning by phone today.  What matters to the patients health and wellness today?  Find Help in My Community.    Goals Addressed             This Visit's Progress    Find Help in My Community.   On track    Care Coordination Interventions:   Assessed Social Determinant of Health Barriers. Discussed Plans for Ongoing Care Management Follow Up. Provided Tree surgeon Information for Care Management Team. Screened for Signs & Symptoms of Depression Related to Chronic Disease State.  WUJ8/JXB1 Depression Screen Completed & Results Reviewed.  Suicidal Ideation & Homicidal Ideation Assessed - None Present.      Active Listening/Reflection Utilized.  Emotional Support Provided. Verbalization of Feelings Encouraged.  Caregiver Stress Acknowledged. Caregiver Resources Reviewed. Self-Enrollment in Caregiver Support Group Emphasized, From List Provided. Crisis Support Information, Agencies & Resources Discussed. Problem Solving Interventions Identified. Solution-Focused Strategies Developed. Task-Centered Activities Implemented.   Psychoeducation for Mental Health Concerns Initiated. Reviewed Prescription Medications & Discussed Importance of Compliance. Quality of Sleep Assessed & Sleep Hygiene Techniques Promoted. Discussed Higher Level of Care Options ([place], [place]) with Son & Encouraged Consideration. Verified No In-Home Care Services, [employer], Building control surveyor, Etc., Covered Under Teaching laboratory technician through [place].  Verified No Long-Term Care Insurance Benefits, [employer], Plans, Etc.  Confirmed  Prior Military Involvement from Patient's Late Husband, Possibly Making Patient Eligible for Aid & Attendance Benefits, Through [employer]. Verified Monthly Social Security Income Exceeds Guardian Life Insurance Poverty Guidelines for [place] of [place] for [employer] 2024, Making Patient Overqualified for Medicaid & Unable to Apply for [place].   Continue to [employer], through [place] of [place] (# [phone]., Monday thru Friday at 11:00 AM. Please Remember to Always Use Your Rolator Gilford Rile While Standing or Walking, In An Effort to Reduce Your Risk of Falls & Injury. Please Review the Following List of Resources, Agencies, Instructions & Applications with Your Son, Emailed on 03/23/2022: ~ Garment/textile technologist ([place]) ~ [place] ~ [place] ~ [place] Providers ~ Aid & Attendance Benefits Application ~ Aid & Attendance Benefits Authorization to [place] ~ Aid & Attendance Benefits Physician Statement ~ Aid & Attendance Theatre stage manager in Support of Claim ~ Applying for Lobbyist ~ [place] ~ [place], Your Way  ~ Actuary ~ Academic librarian ~ [place] with Son, to [place] with Completion of [place] with Completion of Applications for Aid & Attendance Benefits & [employer].         SDOH assessments and interventions completed:  Yes.  SDOH Interventions Today    Flowsheet Row Most Recent Value  SDOH Interventions   Food Insecurity Interventions Intervention Not Indicated  [Verified by Theodosia Paling Linn]  Housing Interventions Intervention Not Indicated  [Verified by Theodosia Paling Mickle]  Transportation Interventions Patient Resources (Friends/Family), Other  (Comment)  [Transportation Resources Provided - Verified by Theodosia Paling Feltner]  Utilities Interventions  Intervention Not Indicated  [Verified by Theodosia Paling Gautreau]  Alcohol Usage Interventions Intervention Not Indicated (Score <7)  [Verified by Theodosia Paling Bruemmer]  Depression Interventions/Treatment  Counseling, Patient refuses Treatment, Medication  [Verified by Theodosia Paling Gatchell]  Financial Strain Interventions Intervention Not Indicated  [Verified by Theodosia Paling Tullius]  Physical Activity Interventions Patient Refused  [Verified by Theodosia Paling Herber]  Stress Interventions Intervention Not Indicated  [Verified by Theodosia Paling Bumgarner]  Social Connections Interventions Intervention Not Indicated, Patient Refused  [Verified by Theodosia Paling Rung]     Care Coordination Interventions:  Yes, provided.   Follow up plan: Follow up call scheduled for 04/06/2022 at 1:15 pm.  Encounter Outcome:  Pt. Visit Completed.   Nat Christen, BSW, MSW, LCSW  Licensed Education officer, environmental Health System  Mailing [place] [address] [place], [place] [address] Physical Address-[address], [place],  [address] Toll Free Main # [phone] Fax # [phone] Cell # [phone] Di [email or web address]

## 2022-03-23 NOTE — Patient Instructions (Signed)
Visit Information  Thank you for taking time to visit with me today. Please don't hesitate to contact me if I can be of assistance to you.   Following are the goals we discussed today:   Goals Addressed             This Visit's Progress    Find Help in My Community.   On track    Care Coordination Interventions:   Assessed Social Determinant of Health Barriers. Discussed Plans for Ongoing Care Management Follow Up. Provided Tree surgeon Information for Care Management Team. Screened for Signs & Symptoms of Depression Related to Chronic Disease State.  DTO6/ZTI4 Depression Screen Completed & Results Reviewed.  Suicidal Ideation & Homicidal Ideation Assessed - None Present.      Active Listening/Reflection Utilized.  Emotional Support Provided. Verbalization of Feelings Encouraged.  Caregiver Stress Acknowledged. Caregiver Resources Reviewed. Self-Enrollment in Caregiver Support Group Emphasized, From List Provided. Crisis Support Information, Agencies & Resources Discussed. Problem Solving Interventions Identified. Solution-Focused Strategies Developed. Task-Centered Activities Implemented.   Psychoeducation for Mental Health Concerns Initiated. Reviewed Prescription Medications & Discussed Importance of Compliance. Quality of Sleep Assessed & Sleep Hygiene Techniques Promoted. Discussed Higher Level of Care Options ([place], [place]) with Son & Encouraged Consideration. Verified No In-Home Care Services, [employer], Building control surveyor, Etc., Covered Under Teaching laboratory technician through [place].  Verified No Long-Term Care Insurance Benefits, [employer], Plans, Etc.  Confirmed Prior Military Involvement from Patient's Late Husband, Possibly Making Patient Eligible for Aid & Attendance Benefits, Through [employer]. Verified  Monthly Social Security Income Exceeds Guardian Life Insurance Poverty Guidelines for [place] of [place] for [employer] 2024, Making Patient Overqualified for Medicaid & Unable to Apply for [place].   Continue to [employer], through [place] of [place] (# [phone]., Monday thru Friday at 11:00 AM. Please Remember to Always Use Your Rolator Gilford Rile While Standing or Walking, In An Effort to Reduce Your Risk of Falls & Injury. Please Review the Following List of Resources, Agencies, Instructions & Applications with Your Son, Emailed on 03/23/2022: ~ Garment/textile technologist ([place]) ~ [place] ~ [place] ~ [place] Providers ~ Aid & Attendance Benefits Application ~ Aid & Attendance Benefits Authorization to [place] ~ Aid & Attendance Benefits Physician Statement ~ Aid & Attendance Theatre stage manager in Support of Claim ~ Applying for Lobbyist ~ [place] ~ [place], Your Way  ~ Actuary ~ Academic librarian ~ [place] with Son, to [place] with Completion of [place] with Completion of Applications for Aid & Attendance Benefits & [employer].       Our next appointment is by telephone on 04/06/2022 at 1:15 pm.  Please call the care guide team at [phone] if you need to cancel or reschedule your appointment.   If you are experiencing a Mental Health or [place] or need someone to talk to, please call the Suicide and Crisis Lifeline: 988 call the [place] National Suicide Prevention Lifeline: 620-282-5408 or TTY: (202) 693-3869 TTY (404)739-7513) to talk to a trained counselor call 1-800-273-TALK (toll free, 24 hour hotline) go to [facility] Urgent  Care [place] [phone]) call the [facility]  Line: [phone] call 911  Patient verbalizes understanding of instructions and care plan provided today and agrees to view in [place]. Active MyChart status and patient understanding of how to access instructions and care plan via MyChart confirmed with patient.     Telephone follow up appointment with care management team member scheduled for:  04/06/2022 at 1:15 pm.    Nat Christen, BSW, MSW, [place]  Licensed Clinical Social Worker  [place]  Mailing [place]. [address], [place], [place] [address] Physical Address-[address] [place], [place] [address] Toll Free Main # [phone] Fax # [phone] Cell # [phone] Di [email or web address]

## 2022-04-06 ENCOUNTER — Ambulatory Visit: Payer: Self-pay | Admitting: *Deleted

## 2022-04-06 ENCOUNTER — Encounter: Payer: Self-pay | Admitting: *Deleted

## 2022-04-06 NOTE — Patient Instructions (Signed)
Visit Information  Thank you for taking time to visit with me today. Please don't hesitate to contact me if I can be of assistance to you.   Following are the goals we discussed today:   Goals Addressed             This Visit's Progress    Find Help in My Community.   On track    Care Coordination Interventions:   Active Listening & Reflection Utilized.  Emotional Support Provided. Verbalization of Feelings Encouraged.  Caregiver Stress Acknowledged. Caregiver Resources Reviewed. Self-Enrollment in Caregiver Support Group Emphasized. Problem Solving Interventions Implemented. Solution-Focused Strategies Enacted. Task-Centered Activities Deployed.   Continue to [employer], through Aging, [place] of [place] [phone]), Monday Through Friday at 11:00 AM. Confirmed Receipt & Thoroughly Reviewed the Following List of Resources, Agencies, Instructions Forensic scientist with Son, to [employer] Understanding: ~ Garment/textile technologist ([place]) ~ [place] ~ [place] ~ Occupational psychologist Providers ~ Aid & Attendance Benefits Application ~ Aid & Attendance Benefits Authorization to [place] ~ Aid & Attendance Benefits Physician Statement ~ Aid & Attendance Theatre stage manager in Support of Claim ~ Applying for Lobbyist ~ [place] ~ [place], Your Way  ~ Actuary ~ Academic librarian ~ [place] with Son, to Assist with Completion of Application for Aid & Attendance Benefits & Submission to [employer] [phone]), for Processing. CSW Collaboration with Son, to Assist with Completion of Application for [employer] & Submission to [employer] of Transportation [phone]), for Processing.       Our  next appointment is by telephone on 04/27/2022 at 10:30 am.  Please call the care guide team at [phone] if you need to cancel or reschedule your appointment.   If you are experiencing a Mental Health or [place] or need someone to talk to, please call the Suicide and Crisis Lifeline: 988 call the [place] National Suicide Prevention Lifeline: 989-870-6381 or TTY: 586-713-8257 TTY (430)237-9880) to talk to a trained counselor call 1-800-273-TALK (toll free, 24 hour hotline) go to [facility] Urgent Care [address] [place] [phone]) call the [place]: [phone] call 911  Patient verbalizes understanding of instructions and care plan provided today and agrees to view in [place]. Active MyChart status and patient understanding of how to access instructions and care plan via MyChart confirmed with patient.     Telephone follow up appointment with care management team member scheduled for:  04/27/2022 at 10:30 am.   Nat Christen, BSW, MSW, [place]  Licensed Clinical Social Worker  [place]  Mailing [place]. [address] [place], [place] [address] Physical Address-[address] [place], [place] [address] Toll Free Main # [phone] Fax # [phone] Cell # [phone] Di [email or web address]

## 2022-04-06 NOTE — Patient Outreach (Signed)
  Care Coordination   Follow Up Visit Note   04/06/2022  Name: Marilyn Haas MRN: [id number] DOB: 1940-07-08  Marilyn Haas is a 82 y.o. year old female who sees Redmond School, MD for primary care. I spoke with patient's son, Marilyn Haas by phone today.  What matters to the patients health and wellness today?  Find Help in My Community.    Goals Addressed             This Visit's Progress    Find Help in My Community.   On track    Care Coordination Interventions:   Active Listening & Reflection Utilized.  Emotional Support Provided. Verbalization of Feelings Encouraged.  Caregiver Stress Acknowledged. Caregiver Resources Reviewed. Self-Enrollment in Caregiver Support Group Emphasized. Problem Solving Interventions Implemented. Solution-Focused Strategies Enacted. Task-Centered Activities Deployed.   Continue to [employer], through Aging, [place] of [place] [phone]), Monday Through Friday at 11:00 AM. Confirmed Receipt & Thoroughly Reviewed the Following List of Resources, Agencies, Instructions Forensic scientist with Son, to [employer] Understanding: ~ Garment/textile technologist ([place]) ~ [place] ~ [place] ~ Occupational psychologist Providers ~ Aid & Attendance Benefits Application ~ Aid & Attendance Benefits Authorization to [place] ~ Aid & Attendance Benefits Physician Statement ~ Aid & Attendance Theatre stage manager in Support of Claim ~ Applying for Lobbyist ~ [place] ~ [place], Your Way  ~ Actuary ~ Academic librarian ~ [place] with Son, to Assist with Completion of Application for Aid & Attendance Benefits & Submission to [employer] [phone]), for Processing. CSW Collaboration with Son, to  Assist with Completion of Application for [employer] & Submission to [employer] of Transportation [phone]), for Processing.       SDOH assessments and interventions completed:  Yes.  Care Coordination Interventions:  Yes, provided.   Follow up plan: Follow up call scheduled for 04/27/2022 at 10:30 am.  Encounter Outcome:  Pt. Visit Completed.   Nat Christen, BSW, MSW, LCSW  Licensed Education officer, environmental Health System  Mailing [place] [address] [place], [place] [address] Physical Address-[address], [place], [place] [address] Toll Free Main # [phone] Fax # [phone] Cell # [phone] Di [email or web address]

## 2022-04-14 ENCOUNTER — Ambulatory Visit: Payer: Medicare Other | Admitting: Cardiology

## 2022-04-27 ENCOUNTER — Ambulatory Visit: Payer: Self-pay | Admitting: *Deleted

## 2022-04-27 ENCOUNTER — Encounter: Payer: Self-pay | Admitting: *Deleted

## 2022-04-27 NOTE — Patient Instructions (Signed)
Visit Information  Thank you for taking time to visit with me today. Please don't hesitate to contact me if I can be of assistance to you.   Following are the goals we discussed today:   Goals Addressed             This Visit's Progress    Find Help in My Community.   On track    Care Coordination Interventions:  Interventions Today    Flowsheet Row Most Recent Value  Chronic Disease Discussed/Reviewed   Chronic disease discussed/reviewed during today's visit Other  [Major Depressive Disorder, Recurrent Episode, Moderate & Anxiety]  General Interventions   General Interventions Discussed/Reviewed General Interventions Discussed, General Interventions Reviewed, Durable Medical Equipment (DME), Health Screening, Community Resources, Doctor Visits  Doctor Visits Discussed/Reviewed Doctor Visits Discussed, Doctor Visits Reviewed, Annual Wellness Visits  Durable Medical Equipment (DME) Bed side commode, BP Cuff, Environmental consultant, Wheelchair, Estate agent  Exercise Interventions   Exercise Discussed/Reviewed Exercise Discussed, Exercise Reviewed, Physical Activity, Weight Managment, Assistive device use and maintanence  Physical Activity Discussed/Reviewed Physical Activity Discussed, Physical Activity Reviewed, Types of exercise  Weight Management Weight maintenance  Education Interventions   Education Provided Provided Verbal Education  Provided Verbal Education On Nutrition, Mental Health/Coping with Illness, Exercise, Medication, When to see the doctor  [place] Discussed, Mental Health Reviewed, Coping Strategies  Nutrition Interventions   Nutrition Discussed/Reviewed Nutrition Discussed, Nutrition Reviewed, Supplmental nutrition, Decreasing sugar intake  Pharmacy Interventions   Pharmacy Dicussed/Reviewed Medication Adherence, Affording Medications  Safety Interventions   Safety Discussed/Reviewed  Safety Discussed, Safety Reviewed, Fall Risk  Advanced Directive Interventions   Advanced Directives Discussed/Reviewed Advanced Directives Discussed, Advanced Directives Reviewed      Active Listening & Reflection Utilized.  Emotional Support Provided. Verbalization of Feelings Encouraged.  Caregiver Stress Acknowledged. Caregiver Resources Reviewed. Self-Enrollment in Caregiver Support Group of Interest Emphasized, from List Provided. Problem Solving Interventions Implemented. Solution-Focused Strategies Enacted. Task-Centered Activities Deployed.   Acceptance & Commitment Therapy Introduced. Brief Cognitive Behavioral Therapy Performed. Client-Centered Therapy Initiated. Continue to [employer], through Aging, [place] of [place] [phone]), Monday Through Friday at 11:00 AM. [place] with Son, Jnia Rasmuson to Confirm His Desire to Not Receive Assistance with Completion of Applications for Aid & Attendance Benefits & Submission to [facility] [phone]) & Completion of Application for Access Transportation Services & Submission to [facility] Department of Transportation [phone]), for Processing. CSW Collaboration with Son, Tyrianna Mustain to [employer] with CSW [phone]), if He Has Questions, Needs Assistance, Changes His Mind About Wanting to [place], or If Additional Social Work Needs Are Identified in [employer] [place].       Please call the care guide team at [phone] if you need to cancel or reschedule your appointment.   If you are experiencing a Mental Health or [place] or need someone to talk to, please call the Suicide and Crisis Lifeline: 988 call the [place] National Suicide Prevention Lifeline: 218-862-8085 or TTY: 223-271-2789 TTY 914-503-5671) to talk to a trained counselor call 1-800-273-TALK (toll free, 24 hour hotline) go to  [facility] Urgent Care [address], [place] [phone]) call the [place]: [phone] call 911  Patient verbalizes understanding of instructions and care plan provided today and agrees to view in [place]. Active MyChart status and patient understanding of how to access instructions  and care plan via MyChart confirmed with patient.     No further follow up required.  Nat Christen, BSW, MSW, LCSW  Licensed Education officer, environmental Health System  Mailing [place] [address]. [address] [place], [place] [address] Physical Address-[address] [place], [place] [address] Toll Free Main # [phone] Fax # [phone] Cell # [phone] Di [email or web address]

## 2022-04-27 NOTE — Patient Outreach (Signed)
  Care Coordination   Follow Up Visit Note   04/27/2022  Name: RAYSA BOSAK MRN: [id number] DOB: 1940-06-06  PATSIE MCCARDLE is a 82 y.o. year old female who sees Redmond School, MD for primary care. I spoke with patient's son, Kynsley Whitehouse by phone today.  What matters to the patients health and wellness today?  Find Help in My Community.    Goals Addressed             This Visit's Progress    Find Help in My Community.   On track    Care Coordination Interventions:  Interventions Today    Flowsheet Row Most Recent Value  Chronic Disease Discussed/Reviewed   Chronic disease discussed/reviewed during today's visit Other  [Major Depressive Disorder, Recurrent Episode, Moderate & Anxiety]  General Interventions   General Interventions Discussed/Reviewed General Interventions Discussed, General Interventions Reviewed, Durable Medical Equipment (DME), Health Screening, Community Resources, Doctor Visits  Doctor Visits Discussed/Reviewed Doctor Visits Discussed, Doctor Visits Reviewed, Annual Wellness Visits  Durable Medical Equipment (DME) Bed side commode, BP Cuff, Environmental consultant, Wheelchair, Estate agent  Exercise Interventions   Exercise Discussed/Reviewed Exercise Discussed, Exercise Reviewed, Physical Activity, Weight Managment, Assistive device use and maintanence  Physical Activity Discussed/Reviewed Physical Activity Discussed, Physical Activity Reviewed, Types of exercise  Weight Management Weight maintenance  Education Interventions   Education Provided Provided Verbal Education  Provided Verbal Education On Nutrition, Mental Health/Coping with Illness, Exercise, Medication, When to see the doctor  [place] Discussed, Mental Health Reviewed, Coping Strategies  Nutrition Interventions   Nutrition Discussed/Reviewed Nutrition Discussed, Nutrition Reviewed, Supplmental nutrition,  Decreasing sugar intake  Pharmacy Interventions   Pharmacy Dicussed/Reviewed Medication Adherence, Affording Medications  Safety Interventions   Safety Discussed/Reviewed Safety Discussed, Safety Reviewed, Fall Risk  Advanced Directive Interventions   Advanced Directives Discussed/Reviewed Advanced Directives Discussed, Advanced Directives Reviewed      Active Listening & Reflection Utilized.  Emotional Support Provided. Verbalization of Feelings Encouraged.  Caregiver Stress Acknowledged. Caregiver Resources Reviewed. Self-Enrollment in Caregiver Support Group of Interest Emphasized, from List Provided. Problem Solving Interventions Implemented. Solution-Focused Strategies Enacted. Task-Centered Activities Deployed.   Acceptance & Commitment Therapy Introduced. Brief Cognitive Behavioral Therapy Performed. Client-Centered Therapy Initiated. Continue to [employer], through Aging, [place] of [place] [phone]), Monday Through Friday at 11:00 AM. [place] with Son, Latoyia Tecson to Confirm His Desire to Not Receive Assistance with Completion of Applications for Aid & Attendance Benefits & Submission to [facility] [phone]) & Completion of Application for Access Transportation Services & Submission to [facility] Department of Transportation [phone]), for Processing. CSW Collaboration with Son, Natoria Archibald to [employer] with CSW [phone]), if He Has Questions, Needs Assistance, Changes His Mind About Wanting to [place], or If Additional Social Work Needs Are Identified in [employer] [place].       SDOH assessments and interventions completed:  Yes.  Care Coordination Interventions:  Yes, provided.   Follow up plan: No further intervention required.   Encounter Outcome:  Pt. Visit Completed.   Nat Christen, BSW, MSW, LCSW  Licensed Regulatory affairs officer Health System  Mailing [place] [address], [place], [place] [address] Physical Address-[address], [place], [place] [address] Toll Free Main # [phone] Fax # [phone] Cell # [phone] Di [email or web address]

## 2022-06-05 DIAGNOSIS — Z6824 Body mass index (BMI) 24.0-24.9, adult: Secondary | ICD-10-CM | POA: Diagnosis not present

## 2022-06-05 DIAGNOSIS — M150 Primary generalized (osteo)arthritis: Secondary | ICD-10-CM | POA: Diagnosis not present

## 2022-06-05 DIAGNOSIS — C50919 Malignant neoplasm of unspecified site of unspecified female breast: Secondary | ICD-10-CM | POA: Diagnosis not present

## 2022-06-05 DIAGNOSIS — T8484XA Pain due to internal orthopedic prosthetic devices, implants and grafts, initial encounter: Secondary | ICD-10-CM | POA: Diagnosis not present

## 2022-06-05 DIAGNOSIS — G894 Chronic pain syndrome: Secondary | ICD-10-CM | POA: Diagnosis not present

## 2022-06-05 DIAGNOSIS — F330 Major depressive disorder, recurrent, mild: Secondary | ICD-10-CM | POA: Diagnosis not present

## 2022-06-05 DIAGNOSIS — I10 Essential (primary) hypertension: Secondary | ICD-10-CM | POA: Diagnosis not present

## 2022-06-30 NOTE — Progress Notes (Deleted)
GI Office Note    Referring Provider: Elfredia Nevins, MD Primary Care Physician:  Elfredia Nevins, MD Primary Gastroenterologist: Dolores Frame, MD  Date:  06/30/2022  ID:  Marilyn Haas, DOB [birth year], MRN [id number]   Chief Complaint   No chief complaint on file.   History of Present Illness  Marilyn Haas is a 82 y.o. female with a history of breast cancer, depression, scoliosis, GERD, HTN, anxiety, arthritis, constipation presenting today for follow up.  EGD 02/28/2020: -Prominent esophageal web s/p dilation with 46 French Maloney dilator -Schatzki's ring dilated with 18 mm balloon  ***   Last office visit 03/04/2022. ***  Today:    Current Outpatient Medications  Medication Sig Dispense Refill   acetaminophen (TYLENOL) 325 MG tablet Take 650 mg by mouth every 6 (six) hours as needed.     ALPRAZolam (XANAX) 0.25 MG tablet Take 0.25 mg by mouth at bedtime as needed for anxiety.     ARIPiprazole (ABILIFY) 2 MG tablet Take 1 tablet (2 mg total) by mouth daily. 30 tablet 0   Cholecalciferol (VITAMIN D3) 125 MCG (5000 UT) CAPS Take 1 capsule by mouth daily. (Patient not taking: Reported on 03/04/2022)     cyanocobalamin 1000 MCG tablet Take 1 tablet (1,000 mcg total) by mouth daily.     DULoxetine (CYMBALTA) 60 MG capsule Take 2 capsules (120 mg total) by mouth daily. 60 capsule 0   ferrous sulfate 325 (65 FE) MG tablet Take 1 tablet (325 mg total) by mouth daily with breakfast. (Patient not taking: Reported on 03/04/2022) 30 tablet 0   folic acid (FOLVITE) 1 MG tablet Take 1 tablet (1 mg total) by mouth daily. 30 tablet 0   furosemide (LASIX) 20 MG tablet Take 1 tablet (20 mg total) by mouth daily as needed for fluid. (Patient not taking: Reported on 03/04/2022) 30 tablet 0   pantoprazole (PROTONIX) 40 MG tablet Take 1 tablet (40 mg total) by mouth 2 (two) times daily. 60 tablet 6   tamoxifen (NOLVADEX) 20 MG tablet Take 0.5 tablets (10 mg total) by  mouth every other day. 15 tablet 0   thiamine (VITAMIN B-1) 100 MG tablet Take 1 tablet (100 mg total) by mouth daily. (Patient not taking: Reported on 03/04/2022)     zolpidem (AMBIEN) 5 MG tablet Take 1 tablet (5 mg total) by mouth at bedtime. (Patient not taking: Reported on 03/04/2022) 30 tablet 0   No current facility-administered medications for this visit.    Past Medical History:  Diagnosis Date   Anemia    Anxiety    Arthritis    Breast cancer (HCC) 05/19/2016   Right breast   Chronic back pain    Scoliosis, stenosis   Depression    Diverticulosis    GERD (gastroesophageal reflux disease)    Hard of hearing    History of blood transfusion    History of colon polyps    History of hiatal hernia    History of pneumonia    Hypertension    Insomnia    Osteopenia    Osteoporosis    Personal history of radiation therapy    Thickened endometrium 09/29/2017    Past Surgical History:  Procedure Laterality Date   BIOPSY  02/07/2022   Procedure: BIOPSY;  Surgeon: Dolores Frame, MD;  Location: [facility];  Service: Gastroenterology;;   BREAST LUMPECTOMY WITH RADIOACTIVE SEED AND SENTINEL LYMPH NODE BIOPSY Right 07/31/2016   Procedure: BREAST LUMPECTOMY WITH RADIOACTIVE SEED AND SENTINEL  LYMPH NODE BIOPSY, INJECT BLUE DYE RIGHT BREAST;  Surgeon: Claud Kelp, MD;  Location: [facility] OR;  Service: General;  Laterality: Right;   BREAST LUMPECTOMY WITH RADIOACTIVE SEED LOCALIZATION Left 05/07/2021   Procedure: LEFT BREAST LUMPECTOMY WITH RADIOACTIVE SEED LOCALIZATION;  Surgeon: Harriette Bouillon, MD;  Location:  S[facility]ERY CENTER;  Service: General;  Laterality: Left;   COLONOSCOPY     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 07/04/2014   Procedure: LEFT CONVERSION TO TOTAL HIP ARTHROPLASTY;  Surgeon: Ollen Gross, MD;  Location: [facility];  Service: Orthopedics;  Laterality: Left;   ESOPHAGEAL DILATION N/A 09/24/2017   Procedure: ESOPHAGEAL DILATION;  Surgeon: Malissa Hippo,  MD;  Location: [facility];  Service: Endoscopy;  Laterality: N/A;   ESOPHAGEAL DILATION N/A 01/19/2020   Procedure: ESOPHAGEAL DILATION;  Surgeon: Dolores Frame, MD;  [facility]: [facility];  Service: Gastroenterology;  Laterality: N/A;   ESOPHAGEAL DILATION  02/28/2020   Procedure: ESOPHAGEAL DILATION;  Surgeon: Malissa Hippo, MD;  [facility]: [facility];  Service: Endoscopy;;   ESOPHAGOGASTRODUODENOSCOPY N/A 01/26/2014   Procedure: ESOPHAGOGASTRODUODENOSCOPY (EGD);  Surgeon: Malissa Hippo, MD;  [facility]: [facility];  Service: Endoscopy;  Laterality: N/A;  1030   ESOPHAGOGASTRODUODENOSCOPY (EGD) WITH PROPOFOL N/A 09/24/2017   Procedure: ESOPHAGOGASTRODUODENOSCOPY (EGD) WITH PROPOFOL;  Surgeon: Malissa Hippo, MD;  [facility]: [facility];  Service: Endoscopy;  Laterality: N/A;  11:15   ESOPHAGOGASTRODUODENOSCOPY (EGD) WITH PROPOFOL N/A 01/19/2020   Procedure: ESOPHAGOGASTRODUODENOSCOPY (EGD) WITH PROPOFOL;  Surgeon: Dolores Frame, MD;  [facility];  Service: Gastroenterology;  Laterality: N/A;  1200   ESOPHAGOGASTRODUODENOSCOPY (EGD) WITH PROPOFOL N/A 02/28/2020   Procedure: ESOPHAGOGASTRODUODENOSCOPY (EGD) WITH PROPOFOL;  Surgeon: Malissa Hippo, MD;  [facility];  Service: Endoscopy;  Laterality: N/A;  2:45   ESOPHAGOGASTRODUODENOSCOPY (EGD) WITH PROPOFOL N/A 02/07/2022   Procedure: ESOPHAGOGASTRODUODENOSCOPY (EGD) WITH PROPOFOL;  Surgeon: Dolores Frame, MD;  [facility];  Service: Gastroenterology;  Laterality: N/A;   HIP FRACTURE SURGERY     05/2009 left -[place]   HYSTEROSCOPY WITH D & C  05/24/2018   Procedure: DILATATION AND CURETTAGE /HYSTEROSCOPY (PROCEDURE # 2)/PAP SMEAR (PROCEDURE #1);  Surgeon: Tilda Burrow, MD;  Location: [facility];  Service: Gynecology;;   [name] DILATION N/A 01/26/2014   Procedure: Alvy Beal;  Surgeon: Malissa Hippo, MD;  Location: [facility];  Service: Endoscopy;   Laterality: N/A;    Family History  Problem Relation Age of Onset   Heart failure Mother    Diabetes Mother    Hypertension Mother    Depression Mother    Other Father        stomach ulcers; abd aneursym   Hypertension Sister    Heart failure Sister    Diabetes Sister    Depression Sister    Diabetes Sister        prediabetes   Cancer Maternal Aunt        breast    Allergies as of 07/01/2022 - Review Complete 04/27/2022  Allergen Reaction Noted   Avelox [moxifloxacin hcl in nacl] Other (See Comments) 05/25/2011    Social History   Socioeconomic History   Marital status: Widowed    Spouse name: Not on file   Number of children: 4   Years of education: 4   Highest education level: 12th grade  Occupational History    Employer: RETIRED  Tobacco Use   Smoking status: Former    Packs/day: 0.50  Years: 2.00    Additional pack years: 0.00    Total pack years: 1.00    Types: Cigarettes    Quit date: 01/15/1961    Years since quitting: 61.4    Passive exposure: Past   Smokeless tobacco: Never   Tobacco comments:    Verified by Silas Flood  Vaping Use   Vaping Use: Never used  Substance and Sexual Activity   Alcohol use: Yes    Alcohol/week: 15.0 standard drinks of alcohol    Types: 15 Cans of beer per week    Comment: 4 "light" beers daily   Drug use: No   Sexual activity: Not Currently    Partners: Male    Birth control/protection: Post-menopausal  Other Topics Concern   Not on file  Social History Narrative   Not on file   Social Determinants of Health   Financial Resource Strain: Low Risk  (03/23/2022)   Overall Financial Resource Strain (CARDIA)    Difficulty of Paying Living Expenses: Not hard at all  Food Insecurity: No Food Insecurity (03/23/2022)   Hunger Vital Sign    Worried About Running Out of Food in the Last Year: Never true    Ran Out of Food in the Last Year: Never true  Transportation Needs: No Transportation Needs (03/23/2022)    PRAPARE - Administrator, Civil Service (Medical): No    Lack of Transportation (Non-Medical): No  Physical Activity: Inactive (03/23/2022)   Exercise Vital Sign    Days of Exercise per Week: 0 days    Minutes of Exercise per Session: 0 min  Stress: No Stress Concern Present (03/23/2022)   [employer] of Occupational Health - Occupational Stress Questionnaire    Feeling of Stress : Only a little  Social Connections: Socially Isolated (03/23/2022)   Social Connection and Isolation Panel [NHANES]    Frequency of Communication with Friends and Family: More than three times a week    Frequency of Social Gatherings with Friends and Family: More than three times a week    Attends Religious Services: Never    Database administrator or Organizations: No    Attends Banker Meetings: Never    Marital Status: Widowed     Review of Systems   Gen: Denies fever, chills, anorexia. Denies fatigue, weakness, weight loss.  CV: Denies chest pain, palpitations, syncope, peripheral edema, and claudication. Resp: Denies dyspnea at rest, cough, wheezing, coughing up blood, and pleurisy. GI: See HPI Derm: Denies rash, itching, dry skin Psych: Denies depression, anxiety, memory loss, confusion. No homicidal or suicidal ideation.  Heme: Denies bruising, bleeding, and enlarged lymph nodes.   Physical Exam   There were no vitals taken for this visit.  General:   Alert and oriented. No distress noted. Pleasant and cooperative.  Head:  Normocephalic and atraumatic. Eyes:  Conjuctiva clear without scleral icterus. Mouth:  Oral mucosa pink and moist. Good dentition. No lesions. Lungs:  Clear to auscultation bilaterally. No wheezes, rales, or rhonchi. No distress.  Heart:  S1, S2 present without murmurs appreciated.  Abdomen:  +BS, soft, non-tender and non-distended. No rebound or guarding. No HSM or masses noted. Rectal: *** Msk:  Symmetrical without gross deformities. Normal  posture. Extremities:  Without edema. Neurologic:  Alert and  oriented x4 Psych:  Alert and cooperative. Normal mood and affect.   Assessment  Marilyn Haas is a 82 y.o. female with a history of *** presenting today with   Anemia:  GERD:   Constipation:   PLAN   ***     Brooke Bonito, MSN, FNP-BC, AGACNP-BC [facility]

## 2022-07-01 ENCOUNTER — Encounter: Payer: Self-pay | Admitting: Gastroenterology

## 2022-07-01 ENCOUNTER — Ambulatory Visit: Payer: Medicare Other | Admitting: Gastroenterology

## 2022-07-06 DIAGNOSIS — M150 Primary generalized (osteo)arthritis: Secondary | ICD-10-CM | POA: Diagnosis not present

## 2022-07-06 DIAGNOSIS — F330 Major depressive disorder, recurrent, mild: Secondary | ICD-10-CM | POA: Diagnosis not present

## 2022-07-06 DIAGNOSIS — G894 Chronic pain syndrome: Secondary | ICD-10-CM | POA: Diagnosis not present

## 2022-07-06 DIAGNOSIS — F419 Anxiety disorder, unspecified: Secondary | ICD-10-CM | POA: Diagnosis not present

## 2022-07-08 ENCOUNTER — Ambulatory Visit: Payer: Medicare Other | Admitting: Cardiology

## 2022-07-14 DIAGNOSIS — F419 Anxiety disorder, unspecified: Secondary | ICD-10-CM | POA: Diagnosis not present

## 2022-07-14 DIAGNOSIS — I10 Essential (primary) hypertension: Secondary | ICD-10-CM | POA: Diagnosis not present

## 2022-07-30 ENCOUNTER — Other Ambulatory Visit: Payer: Self-pay | Admitting: Hematology and Oncology

## 2022-07-31 DIAGNOSIS — G894 Chronic pain syndrome: Secondary | ICD-10-CM | POA: Diagnosis not present

## 2022-07-31 DIAGNOSIS — M150 Primary generalized (osteo)arthritis: Secondary | ICD-10-CM | POA: Diagnosis not present

## 2022-07-31 DIAGNOSIS — I10 Essential (primary) hypertension: Secondary | ICD-10-CM | POA: Diagnosis not present

## 2022-08-14 DIAGNOSIS — F419 Anxiety disorder, unspecified: Secondary | ICD-10-CM | POA: Diagnosis not present

## 2022-08-14 DIAGNOSIS — I10 Essential (primary) hypertension: Secondary | ICD-10-CM | POA: Diagnosis not present

## 2022-09-07 DIAGNOSIS — G894 Chronic pain syndrome: Secondary | ICD-10-CM | POA: Diagnosis not present

## 2022-09-07 DIAGNOSIS — I10 Essential (primary) hypertension: Secondary | ICD-10-CM | POA: Diagnosis not present

## 2022-09-07 DIAGNOSIS — F419 Anxiety disorder, unspecified: Secondary | ICD-10-CM | POA: Diagnosis not present

## 2022-09-07 DIAGNOSIS — M150 Primary generalized (osteo)arthritis: Secondary | ICD-10-CM | POA: Diagnosis not present

## 2022-09-07 DIAGNOSIS — Z6822 Body mass index (BMI) 22.0-22.9, adult: Secondary | ICD-10-CM | POA: Diagnosis not present

## 2022-09-10 ENCOUNTER — Telehealth: Payer: Self-pay | Admitting: Hematology and Oncology

## 2022-09-10 NOTE — Telephone Encounter (Signed)
Left a message regarding appointment times/date.

## 2022-09-25 ENCOUNTER — Encounter: Payer: Self-pay | Admitting: *Deleted

## 2022-09-25 ENCOUNTER — Ambulatory Visit: Payer: Self-pay | Admitting: *Deleted

## 2022-09-25 NOTE — Patient Outreach (Signed)
  Care Coordination   Follow Up Visit Note   09/25/2022 Name: Marilyn Haas MRN: [id number] DOB: Dec 19, 1940  Marilyn Haas is a 82 y.o. year old female who sees Elfredia Nevins, MD for primary care. I spoke with  [place] by phone today.  What matters to the patients health and wellness today?  Maintaining independence and health    Goals Addressed             This Visit's Progress    Care Coordination Needs       Care Coordination Interventions: Patient will take medications as directed Patient will keep all medical appointments Verified that son arranges or provides transportation Patient will move carefully and change positions slowly to decrease risk for falls Patient will check and record blood pressure and will take readings to provider appointments for review Patient will use walker as needed for ambulation Patient will reach out to RN Care Coordinator [phone] with any resource or care coordination needs         SDOH assessments and interventions completed:  Yes  SDOH Interventions Today    Flowsheet Row Most Recent Value  SDOH Interventions   Food Insecurity Interventions Intervention Not Indicated  Housing Interventions Intervention Not Indicated  Transportation Interventions Patient Resources (Friends/Family)  Financial Strain Interventions Intervention Not Indicated        Care Coordination Interventions:  Yes, provided  Interventions Today    Flowsheet Row Most Recent Value  Chronic Disease   Chronic disease during today's visit Hypertension (HTN)  General Interventions   General Interventions Discussed/Reviewed General Interventions Discussed, General Interventions Reviewed, Labs, Doctor Visits, Durable Medical Equipment (DME)  [No home blood pressure readings to report]  Labs Kidney Function  Doctor Visits Discussed/Reviewed Doctor Visits Discussed, Doctor Visits Reviewed, PCP, Specialist  [facility] and hematology visits scheduled  for 8/27 and cardiology scheduled for 8/30]  Durable Medical Equipment (DME) Dan Humphreys  PCP/Specialist Visits Compliance with follow-up visit  Exercise Interventions   Exercise Discussed/Reviewed Physical Activity  Physical Activity Discussed/Reviewed Physical Activity Discussed, Physical Activity Reviewed  Education Interventions   Education Provided Provided Education  Provided Verbal Education On Nutrition, Mental Health/Coping with Illness, When to see the doctor, Medication, Exercise  Nutrition Interventions   Nutrition Discussed/Reviewed Nutrition Discussed, Nutrition Reviewed, Fluid intake  Pharmacy Interventions   Pharmacy Dicussed/Reviewed Pharmacy Topics Discussed, Pharmacy Topics Reviewed, Medications and their functions  Safety Interventions   Safety Discussed/Reviewed Safety Discussed, Safety Reviewed, Fall Risk, Home Safety  Home Safety Assistive Devices       Follow up plan: Follow up call scheduled for 10/26/22    Encounter Outcome:  Pt. Visit Completed   Demetrios Loll, BSN, RN-BC RN Care Coordinator [facility]  [facility] Direct Dial: [phone] Main #: [phone]

## 2022-10-23 ENCOUNTER — Other Ambulatory Visit: Payer: Self-pay | Admitting: Cardiology

## 2022-10-26 ENCOUNTER — Ambulatory Visit: Payer: Medicare Other

## 2022-10-26 ENCOUNTER — Ambulatory Visit
Admission: EM | Admit: 2022-10-26 | Discharge: 2022-10-26 | Disposition: A | Discharge status: Home / Self Care | Payer: Medicare Other | Attending: Nurse Practitioner | Admitting: Nurse Practitioner

## 2022-10-26 ENCOUNTER — Encounter: Payer: Self-pay | Admitting: *Deleted

## 2022-10-26 ENCOUNTER — Ambulatory Visit: Payer: Self-pay | Admitting: *Deleted

## 2022-10-26 DIAGNOSIS — R1084 Generalized abdominal pain: Secondary | ICD-10-CM

## 2022-10-26 DIAGNOSIS — R109 Unspecified abdominal pain: Secondary | ICD-10-CM | POA: Diagnosis not present

## 2022-10-26 DIAGNOSIS — R197 Diarrhea, unspecified: Secondary | ICD-10-CM

## 2022-10-26 DIAGNOSIS — R1030 Lower abdominal pain, unspecified: Secondary | ICD-10-CM

## 2022-10-26 DIAGNOSIS — K449 Diaphragmatic hernia without obstruction or gangrene: Secondary | ICD-10-CM | POA: Diagnosis not present

## 2022-10-26 LAB — POCT URINALYSIS DIP (MANUAL ENTRY)
Bilirubin, UA: NEGATIVE
Glucose, UA: NEGATIVE mg/dL
Ketones, POC UA: NEGATIVE mg/dL
Leukocytes, UA: NEGATIVE
Nitrite, UA: NEGATIVE
Protein Ur, POC: NEGATIVE mg/dL
Spec Grav, UA: <=1.005 — AB (ref 1.010–1.025)
Urobilinogen, UA: 0.2 E.U./dL
pH, UA: 5.5 (ref 5.0–8.0)

## 2022-10-26 MED ORDER — LOPERAMIDE HCL 2 MG PO CAPS: 2.0000 mg | ORAL_CAPSULE | Freq: Four times a day (QID) | ORAL | 0 refills | Status: DC | PRN

## 2022-10-26 NOTE — ED Provider Notes (Addendum)
[facility]    CSN: [id number] Arrival date & time: 10/26/22  0801      History   Chief Complaint No chief complaint on file.   HPI Marilyn Haas Marilyn Haas is a 82 y.o. female.   The history is provided by the patient and a relative (Son).   The patient presents with family for complaints of diarrhea and abdominal pain.  Patient's family reports this has been an ongoing problem, states "this has been going on for 30 years".  Son states patient has episodes of constipation with alternating episodes of diarrhea.  Patient's daughter-in-law reports that she was hospitalized at the end of last year with the same or similar symptoms, but was found to have associated blood loss.  Patient states that she started having diarrhea approximately 6 days ago.  Patient's son states patient's diet is poor.  He states she does not consume a lot of fruits or vegetables and does not drink water.  Patient agrees to same.  Patient denies fever, chills, chest pain, nausea, vomiting, bloody stools, bloating, or urinary symptoms.  Past Medical History:  Diagnosis Date   Anemia    Anxiety    Arthritis    Breast cancer (HCC) 05/19/2016   Right breast   Chronic back pain    Scoliosis, stenosis   Depression    Diverticulosis    GERD (gastroesophageal reflux disease)    Hard of hearing    History of blood transfusion    History of colon polyps    History of hiatal hernia    History of pneumonia    Hypertension    Insomnia    Osteopenia    Osteoporosis    Personal history of radiation therapy    Thickened endometrium 09/29/2017    Patient Active Problem List   Diagnosis Date Noted   Frequent falls 02/14/2022   GI bleed 02/06/2022   Elevated BUN 02/06/2022   GERD (gastroesophageal reflux disease) 07/05/2021   Fall at home, initial encounter 07/04/2021   MDD (major depressive disorder), recurrent episode, moderate (HCC) 06/13/2018   Dysphagia 09/01/2017   Primary cancer of upper outer  quadrant of right breast (HCC) 08/13/2016   Malignant neoplasm of upper-outer quadrant of right breast in female, estrogen receptor positive (HCC) 06/22/2016   Osteopenia determined by x-ray 06/22/2016   Chronic hyponatremia 10/12/2015   PVC's (premature ventricular contractions) 12/16/2014   Blood loss anemia 08/09/2012   B12 deficiency anemia 08/09/2012   Vitamin D deficiency 08/09/2012   Anxiety 08/09/2012   Hyperlipidemia 05/25/2011   Essential hypertension 05/25/2011    Past Surgical History:  Procedure Laterality Date   BIOPSY  02/07/2022   Procedure: BIOPSY;  Surgeon: Dolores Frame, MD;  Location: [facility];  Service: Gastroenterology;;   BREAST LUMPECTOMY WITH RADIOACTIVE SEED AND SENTINEL LYMPH NODE BIOPSY Right 07/31/2016   Procedure: BREAST LUMPECTOMY WITH RADIOACTIVE SEED AND SENTINEL LYMPH NODE BIOPSY, INJECT BLUE DYE RIGHT BREAST;  Surgeon: Claud Kelp, MD;  Location: [facility];  Service: General;  Laterality: Right;   BREAST LUMPECTOMY WITH RADIOACTIVE SEED LOCALIZATION Left 05/07/2021   Procedure: LEFT BREAST LUMPECTOMY WITH RADIOACTIVE SEED LOCALIZATION;  Surgeon: Harriette Bouillon, MD;  Location: [facility];  Service: General;  Laterality: Left;   COLONOSCOPY     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 07/04/2014   Procedure: LEFT CONVERSION TO TOTAL HIP ARTHROPLASTY;  Surgeon: Ollen Gross, MD;  Location: [facility];  Service: Orthopedics;  Laterality: Left;   ESOPHAGEAL DILATION N/A 09/24/2017  Procedure: ESOPHAGEAL DILATION;  Surgeon: Malissa Hippo, MD;  [facility]: [facility];  Service: Endoscopy;  Laterality: N/A;   ESOPHAGEAL DILATION N/A 01/19/2020   Procedure: ESOPHAGEAL DILATION;  Surgeon: Dolores Frame, MD;  [facility]: [facility];  Service: Gastroenterology;  Laterality: N/A;   ESOPHAGEAL DILATION  02/28/2020   Procedure: ESOPHAGEAL DILATION;  Surgeon: Malissa Hippo, MD;  [facility];  Service: Endoscopy;;    ESOPHAGOGASTRODUODENOSCOPY N/A 01/26/2014   Procedure: ESOPHAGOGASTRODUODENOSCOPY (EGD);  Surgeon: Malissa Hippo, MD;  [facility];  Service: Endoscopy;  Laterality: N/A;  1030   ESOPHAGOGASTRODUODENOSCOPY (EGD) WITH PROPOFOL N/A 09/24/2017   Procedure: ESOPHAGOGASTRODUODENOSCOPY (EGD) WITH PROPOFOL;  Surgeon: Malissa Hippo, MD;  [facility];  Service: Endoscopy;  Laterality: N/A;  11:15   ESOPHAGOGASTRODUODENOSCOPY (EGD) WITH PROPOFOL N/A 01/19/2020   Procedure: ESOPHAGOGASTRODUODENOSCOPY (EGD) WITH PROPOFOL;  Surgeon: Dolores Frame, MD;  [facility];  Service: Gastroenterology;  Laterality: N/A;  1200   ESOPHAGOGASTRODUODENOSCOPY (EGD) WITH PROPOFOL N/A 02/28/2020   Procedure: ESOPHAGOGASTRODUODENOSCOPY (EGD) WITH PROPOFOL;  Surgeon: Malissa Hippo, MD;  [facility];  Service: Endoscopy;  Laterality: N/A;  2:45   ESOPHAGOGASTRODUODENOSCOPY (EGD) WITH PROPOFOL N/A 02/07/2022   Procedure: ESOPHAGOGASTRODUODENOSCOPY (EGD) WITH PROPOFOL;  Surgeon: Dolores Frame, MD;  [facility];  Service: Gastroenterology;  Laterality: N/A;   HIP FRACTURE SURGERY     05/2009 left -[facility]   HYSTEROSCOPY WITH D & C  05/24/2018   Procedure: DILATATION AND CURETTAGE /HYSTEROSCOPY (PROCEDURE # 2)/PAP SMEAR (PROCEDURE #1);  Surgeon: Tilda Burrow, MD;  Location: [facility];  Service: Gynecology;;   [name] DILATION N/A 01/26/2014   Procedure: Alvy Beal;  Surgeon: Malissa Hippo, MD;  [facility];  Service: Endoscopy;  Laterality: N/A;    OB History     Gravida  3   Para  3   Term  3   Preterm      AB      Living  3      SAB      IAB      Ectopic      Multiple      Live Births  3            Home Medications    Prior to Admission medications   Medication Sig Start Date End Date Taking? Authorizing Provider  loperamide (IMODIUM) 2 MG capsule Take 1 capsule (2 mg total) by mouth 4 (four)  times daily as needed for diarrhea or loose stools. 10/26/22  Yes -Warren, Sadie Haber, NP  acetaminophen (TYLENOL) 325 MG tablet Take 650 mg by mouth every 6 (six) hours as needed.    [provider]  ALPRAZolam Prudy Feeler) 0.25 MG tablet Take 0.25 mg by mouth at bedtime as needed for anxiety.    [provider]  ARIPiprazole (ABILIFY) 2 MG tablet Take 1 tablet (2 mg total) by mouth daily. 02/26/22   Sharee Holster, NP  Cholecalciferol (VITAMIN D3) 125 MCG (5000 UT) CAPS Take 1 capsule by mouth daily. Patient not taking: Reported on 03/04/2022    [provider]  cyanocobalamin 1000 MCG tablet Take 1 tablet (1,000 mcg total) by mouth daily. 02/12/22   Catarina Hartshorn, MD  DULoxetine (CYMBALTA) 60 MG capsule Take 2 capsules (120 mg total) by mouth daily. 02/26/22   Sharee Holster, NP  ferrous sulfate 325 (65 FE) MG tablet Take 1 tablet (325 mg total) by  mouth daily with breakfast. Patient not taking: Reported on 03/04/2022 02/26/22   Sharee Holster, NP  folic acid (FOLVITE) 1 MG tablet Take 1 tablet (1 mg total) by mouth daily. 02/26/22   Sharee Holster, NP  furosemide (LASIX) 20 MG tablet Take 1 tablet (20 mg total) by mouth daily as needed for fluid. Patient not taking: Reported on 03/04/2022 02/26/22   Sharee Holster, NP  losartan (COZAAR) 25 MG tablet TAKE ONE TABLET BY MOUTH TWICE A DAY 10/23/22   Jonelle Sidle, MD  pantoprazole (PROTONIX) 40 MG tablet Take 1 tablet (40 mg total) by mouth 2 (two) times daily. 03/04/22   Aida Raider, NP  tamoxifen (NOLVADEX) 10 MG tablet TAKE ONE TABLET (10MG  TOTAL) BY MOUTH EVERY OTHER DAY 07/30/22   Rachel Moulds, MD  tamoxifen (NOLVADEX) 20 MG tablet Take 0.5 tablets (10 mg total) by mouth every other day. 02/26/22   Sharee Holster, NP  thiamine (VITAMIN B-1) 100 MG tablet Take 1 tablet (100 mg total) by mouth daily. Patient not taking: Reported on 03/04/2022 02/12/22   Catarina Hartshorn, MD  zolpidem (AMBIEN) 5 MG tablet  Take 1 tablet (5 mg total) by mouth at bedtime. Patient not taking: Reported on 03/04/2022 02/26/22   Sharee Holster, NP    Family History Family History  Problem Relation Age of Onset   Heart failure Mother    Diabetes Mother    Hypertension Mother    Depression Mother    Other Father        stomach ulcers; abd aneursym   Hypertension Sister    Heart failure Sister    Diabetes Sister    Depression Sister    Diabetes Sister        prediabetes   Cancer Maternal Aunt        breast    Social History Social History   Tobacco Use   Smoking status: Former    Current packs/day: 0.00    Average packs/day: 0.5 packs/day for 2.0 years (1.0 ttl pk-yrs)    Types: Cigarettes    Start date: 01/16/1959    Quit date: 01/15/1961    Years since quitting: 61.8    Passive exposure: Past   Smokeless tobacco: Never   Tobacco comments:    Verified by Silas Flood  Vaping Use   Vaping status: Never Used  Substance Use Topics   Alcohol use: Yes    Alcohol/week: 15.0 standard drinks of alcohol    Types: 15 Cans of beer per week    Comment: 4 "light" beers daily   Drug use: No     Allergies   Avelox [moxifloxacin hcl in nacl]   Review of Systems Review of Systems Per HPI  Physical Exam Triage Vital Signs ED Triage Vitals  Encounter Vitals Group     BP 10/26/22 0814 (!) 157/94     Systolic BP Percentile --      Diastolic BP Percentile --      Pulse Rate 10/26/22 0814 (!) 101     Resp 10/26/22 0814 18     Temp 10/26/22 0814 98.4 F (36.9 C)     Temp Source 10/26/22 0814 Oral     SpO2 10/26/22 0814 96 %     Weight --      Height --      Head Circumference --      Peak Flow --      Pain Score 10/26/22 0815 0  Pain Loc --      Pain Education --      Exclude from Growth Chart --    No data found.  Updated Vital Signs BP (!) 157/94 (BP Location: Left Arm)   Pulse (!) 101   Temp 98.4 F (36.9 C) (Oral)   Resp 18   SpO2 96%   Visual Acuity Right Eye  Distance:   Left Eye Distance:   Bilateral Distance:    Right Eye Near:   Left Eye Near:    Bilateral Near:     Physical Exam Vitals and nursing note reviewed.  Constitutional:      General: She is not in acute distress.    Appearance: Normal appearance.  HENT:     Head: Normocephalic.     Mouth/Throat:     Mouth: Mucous membranes are moist.  Eyes:     Extraocular Movements: Extraocular movements intact.     Pupils: Pupils are equal, round, and reactive to light.  Cardiovascular:     Rate and Rhythm: Normal rate and regular rhythm.     Pulses: Normal pulses.     Heart sounds: Normal heart sounds.  Pulmonary:     Effort: Pulmonary effort is normal. No respiratory distress.     Breath sounds: Normal breath sounds. No stridor. No wheezing, rhonchi or rales.  Abdominal:     General: Bowel sounds are normal. There is no distension.     Palpations: Abdomen is soft.     Tenderness: There is no abdominal tenderness.  Musculoskeletal:     Cervical back: Normal range of motion.  Skin:    General: Skin is warm and dry.  Neurological:     General: No focal deficit present.     Mental Status: She is alert and oriented to person, place, and time.  Psychiatric:        Mood and Affect: Mood normal.        Behavior: Behavior normal.      UC Treatments / Results  Labs (all labs ordered are listed, but only abnormal results are displayed) Labs Reviewed  POCT URINALYSIS DIP (MANUAL ENTRY) - Abnormal; Notable for the following components:      Result Value   Clarity, UA hazy (*)    Spec Grav, UA <=1.005 (*)    Blood, UA small (*)    All other components within normal limits  URINE CULTURE    EKG   Radiology DG Abd 2 Views  Result Date: 10/26/2022 CLINICAL DATA:  One week history of diarrhea with lower abdominal pain for 1 day. EXAM: ABDOMEN - 2 VIEW COMPARISON:  None Available. FINDINGS: Bowel gas pattern appears nonobstructed. Gas noted within the colon up to the level of  the rectum. Hiatal hernia identified, moderate. Previous left hip arthroplasty. Lumbar scoliosis with convexity towards the right. Multilevel lumbar degenerative disc disease. IMPRESSION: 1. Nonobstructive bowel gas pattern. 2. Hiatal hernia. Electronically Signed   By: Signa Kell M.D.   On: 10/26/2022 09:29    Procedures Procedures (including critical care time)  Medications Ordered in UC Medications - No data to display  Initial Impression / Assessment and Plan / UC Course  I have reviewed the triage vital signs and the nursing notes.  Pertinent labs & imaging results that were available during my care of the patient were reviewed by me and considered in my medical decision making (see chart for details).  The patient is well-appearing, she is in no acute distress, vital signs are  stable.  Urinalysis is not suggestive of dehydration nor an obvious infection.  Urine culture is pending.  X-ray of her abdomen does show moderate gas within the colon.  Will treat patient with Imodium A-D for diarrhea.  Supportive care recommendations were provided and discussed with her family to include increasing foods to decrease diarrhea, over-the-counter Tylenol for pain or discomfort, and continuing to have her drink plenty of water.  Recommend follow-up with gastroenterology for further evaluation if symptoms continue to persist.  Patient's family was advised to follow-up in the emergency department immediately if the patient experiences worsening abdominal pain, diarrhea, or new symptoms of fever, with nausea and vomiting.  Patient and family are in agreement with this plan of care and verbalized understanding.  All questions were answered.  Patient stable for discharge.   Final Clinical Impressions(s) / UC Diagnoses   Final diagnoses:  Diarrhea, unspecified type  Generalized abdominal pain     Discharge Instructions      The urinalysis shows that she is well-hydrated, and there is no signs of  infection.  A urine culture has been ordered.  You will be contacted if the pending test result is abnormal. The x-ray of the abdomen does not show any acute findings. Recommend a diet to decrease diarrhea.  I have attached a reference for you to review. Tylenol as needed for pain or discomfort. Make sure she is drinking plenty of fluids, preferably water. If she develops worsening diarrhea, abdominal pain, with new symptoms of fever, chills, or vomiting, please go to the emergency department immediately for further evaluation. It is recommended that she follow-up with gastroenterology if symptoms continue to persist. Follow-up as needed.     ED Prescriptions     Medication Sig Dispense Auth. Provider   loperamide (IMODIUM) 2 MG capsule Take 1 capsule (2 mg total) by mouth 4 (four) times daily as needed for diarrhea or loose stools. 12 capsule -Warren, Sadie Haber, NP      PDMP not reviewed this encounter.   Abran Cantor, NP 10/26/22 1000    Abran Cantor, NP 10/26/22 1004

## 2022-10-26 NOTE — ED Triage Notes (Signed)
Pt reports diarrhea and low abdominal pain with no appetite. x 1 day.  Took pepto but no relief. Doesn't drink water only soda and family is concerned about dehydration.

## 2022-10-26 NOTE — Discharge Instructions (Addendum)
The urinalysis shows that she is well-hydrated, and there is no signs of infection.  A urine culture has been ordered.  You will be contacted if the pending test result is abnormal. The x-ray of the abdomen does not show any acute findings. Recommend a diet to decrease diarrhea.  I have attached a reference for you to review. Tylenol as needed for pain or discomfort. Make sure she is drinking plenty of fluids, preferably water. If she develops worsening diarrhea, abdominal pain, with new symptoms of fever, chills, or vomiting, please go to the emergency department immediately for further evaluation. It is recommended that she follow-up with gastroenterology if symptoms continue to persist. Follow-up as needed.

## 2022-10-28 NOTE — Patient Outreach (Signed)
Care Coordination   Follow Up Visit Note   10/28/2022 Name: Marilyn Haas MRN: [id number] DOB: 04-Oct-1940  Marilyn Haas is a 82 y.o. year old female who sees Elfredia Nevins, MD for primary care. I spoke with  [place] by phone today.  What matters to the patients health and wellness today?  Managing diet    Goals Addressed             This Visit's Progress    Blood Pressure Management       Care Coordination Goals: Patient will take medications as directed and report any negative side effects to provider  Patient will monitor and record blood pressure daily and as needed and will call PCP with any readings outside of recommended range Patient will keep all recommended follow-up appointments with PCP and specialists (cardiology, nephrology, etc) Patient will take blood pressure log to PCP and specialty appointments for review Patient will follow a low sodium/DASH diet  Patient will reach out to RN Care Coordinator [phone] with any care coordination or resource needs       COMPLETED: Care Coordination Needs       Care Coordination Interventions: Patient will take medications as directed Patient will keep all medical appointments Verified that son arranges or provides transportation Patient will move carefully and change positions slowly to decrease risk for falls Patient will check and record blood pressure and will take readings to provider appointments for review Patient will use walker as needed for ambulation Patient will reach out to RN Care Coordinator [phone] with any resource or care coordination needs      Regulate Bowels       Care Coordination Goals: Patient will keep all medical appointments Patient will follow-up with PCP with any new, worsening, or ongoing symptoms of diarrhea or other associated symptoms GI referral may be needed to evaluate alternating constipation and diarrhea Patient will take Imodium as recommended by Urgent  Care today Patient will eat a bland diet until diarrhea resolves. Avoid dairy, greasy foods, and spicy foods.  Patient will incorporate bulk forming fiber supplements or foods into diet Patient will review hand out on bulk forming forming Patient will reach out to RN Care Coordinator at [phone] with any resource or care coordination needs        SDOH assessments and interventions completed:  Yes  SDOH Interventions Today    Flowsheet Row Most Recent Value  SDOH Interventions   Transportation Interventions Intervention Not Indicated  Financial Strain Interventions Intervention Not Indicated        Care Coordination Interventions:  Yes, provided  Interventions Today    Flowsheet Row Most Recent Value  Chronic Disease   Chronic disease during today's visit Diabetes, Other  [Diarrhea x 6 days]  General Interventions   General Interventions Discussed/Reviewed General Interventions Discussed, General Interventions Reviewed, Durable Medical Equipment (DME), Labs, Doctor Visits  Labs Kidney Function  [reviewed labs done at urgent care today]  Doctor Visits Discussed/Reviewed Doctor Visits Discussed, Doctor Visits Reviewed, PCP, Annual Wellness Visits, Specialist  [facility] need to see gastroenterologist if diarrhea persists or if alternating diarrhea and constipation. Reviewed and discussed urgent care visit today for diarrhea x 6 days.]  Durable Medical Equipment (DME) BP Cuff  [No home readings available today. Blood pressure at urgent care visit today was 157/94 with a pulse of 101]  PCP/Specialist Visits Compliance with follow-up visit  [Follow-up with PCP with any new, worsening, or persistent bowel symptoms. Follow-up with PCP for  blood pressure management.]  Exercise Interventions   Exercise Discussed/Reviewed Physical Activity, Exercise Discussed, Exercise Reviewed  Physical Activity Discussed/Reviewed Physical Activity Discussed, Physical Activity Reviewed  [encouraged physical  activity as tolerated with a goal of at least 150 minutes per week]  Education Interventions   Education Provided Provided Education, Provided Printed Education  [printed education on bland diet for diarrhe and on bulk forming fiber]  Provided Verbal Education On When to see the doctor, Nutrition, Medication, Other  [blood pressure monitoring]  Nutrition Interventions   Nutrition Discussed/Reviewed Nutrition Discussed, Nutrition Reviewed, Adding fruits and vegetables, Portion sizes, Fluid intake  [Drink plenty of fluids to decrease risk for dehydration. Bland diet until diarrea resolves. Bulk forming supplements or foods with plenty of water to regulate bowel movements]  Pharmacy Interventions   Pharmacy Dicussed/Reviewed Pharmacy Topics Discussed, Pharmacy Topics Reviewed, Medications and their functions  [Imodium per Urgent Care instructions for diarrhea. Seek medical attention for new, worsening, or persistent symptoms.]  Safety Interventions   Safety Discussed/Reviewed Safety Reviewed, Safety Discussed       Follow up plan: Follow up call scheduled for 11/26/22    Encounter Outcome:  Pt. Visit Completed   Demetrios Loll, BSN, RN-BC RN Care Coordinator [facility]  [facility] Direct Dial: [phone] Main #: [phone]

## 2022-10-29 ENCOUNTER — Ambulatory Visit: Payer: Medicare Other | Admitting: Hematology and Oncology

## 2022-11-10 ENCOUNTER — Inpatient Hospital Stay: Payer: Medicare Other | Admitting: Hematology and Oncology

## 2022-11-13 ENCOUNTER — Ambulatory Visit: Payer: Medicare Other | Admitting: Cardiology

## 2022-11-26 ENCOUNTER — Ambulatory Visit: Payer: Self-pay | Admitting: *Deleted

## 2022-11-26 NOTE — Patient Outreach (Signed)
  Care Coordination   11/26/2022 Name: Marilyn Haas MRN: [id number] DOB: Aug 13, 1940   Care Coordination Outreach Attempts:  An unsuccessful telephone outreach was attempted for a scheduled appointment today.  Follow Up Plan:  Additional outreach attempts will be made to offer the patient care coordination information and services.   Encounter Outcome:  No Answer. Left HIPAA compliant VM.   Care Coordination Interventions:  No, not indicated. Staff message sent to scheduling care guide for outreach and rescheduling.    Demetrios Loll, RN, BSN Care Management Coordinator [facility]  [facility] Direct Dial: [phone] Main #: [phone]

## 2022-11-27 ENCOUNTER — Telehealth: Payer: Self-pay | Admitting: *Deleted

## 2022-11-27 NOTE — Progress Notes (Signed)
Care Coordination Note  11/27/2022 Name: Marilyn Haas MRN: [id number] DOB: 10-31-1940  Marilyn Haas is a 82 y.o. year old female who is a primary care patient of Elfredia Nevins, MD and is actively engaged with the care management team. I reached out to [place] by phone today to assist with re-scheduling a follow up visit with the RN Case Manager  Follow up plan: Unsuccessful telephone outreach attempt made. A HIPAA compliant phone message was left for the patient providing contact information and requesting a return call.   [facility]  Care Coordination Care Guide  Direct Dial: [phone]

## 2022-12-06 DIAGNOSIS — I959 Hypotension, unspecified: Secondary | ICD-10-CM | POA: Diagnosis not present

## 2022-12-06 DIAGNOSIS — R569 Unspecified convulsions: Secondary | ICD-10-CM | POA: Diagnosis not present

## 2022-12-06 DIAGNOSIS — R410 Disorientation, unspecified: Secondary | ICD-10-CM | POA: Diagnosis not present

## 2022-12-07 ENCOUNTER — Inpatient Hospital Stay (HOSPITAL_COMMUNITY)
Admit: 2022-12-07 | Discharge: 2022-12-07 | Disposition: A | Discharge status: Home / Self Care | Payer: Medicare Other | Attending: Family Medicine

## 2022-12-07 ENCOUNTER — Inpatient Hospital Stay: Payer: Medicare Other

## 2022-12-07 ENCOUNTER — Emergency Department: Payer: Medicare Other

## 2022-12-07 ENCOUNTER — Other Ambulatory Visit: Payer: Self-pay

## 2022-12-07 ENCOUNTER — Ambulatory Visit: Payer: Medicare Other

## 2022-12-07 ENCOUNTER — Inpatient Hospital Stay
Admission: EM | Admit: 2022-12-07 | Discharge: 2022-12-11 | DRG: 640 | Disposition: A | Discharge status: Skilled Nursing Facility | Payer: Medicare Other | Attending: Obstetrics and Gynecology | Admitting: Obstetrics and Gynecology

## 2022-12-07 DIAGNOSIS — Z818 Family history of other mental and behavioral disorders: Secondary | ICD-10-CM

## 2022-12-07 DIAGNOSIS — L89302 Pressure ulcer of unspecified buttock, stage 2: Secondary | ICD-10-CM | POA: Diagnosis present

## 2022-12-07 DIAGNOSIS — K219 Gastro-esophageal reflux disease without esophagitis: Secondary | ICD-10-CM | POA: Insufficient documentation

## 2022-12-07 DIAGNOSIS — Z79810 Long term (current) use of selective estrogen receptor modulators (SERMs): Secondary | ICD-10-CM

## 2022-12-07 DIAGNOSIS — R7989 Other specified abnormal findings of blood chemistry: Secondary | ICD-10-CM | POA: Diagnosis not present

## 2022-12-07 DIAGNOSIS — R410 Disorientation, unspecified: Principal | ICD-10-CM

## 2022-12-07 DIAGNOSIS — R569 Unspecified convulsions: Secondary | ICD-10-CM | POA: Diagnosis not present

## 2022-12-07 DIAGNOSIS — K269 Duodenal ulcer, unspecified as acute or chronic, without hemorrhage or perforation: Secondary | ICD-10-CM | POA: Diagnosis not present

## 2022-12-07 DIAGNOSIS — D72829 Elevated white blood cell count, unspecified: Secondary | ICD-10-CM | POA: Diagnosis present

## 2022-12-07 DIAGNOSIS — F10930 Alcohol use, unspecified with withdrawal, uncomplicated: Secondary | ICD-10-CM

## 2022-12-07 DIAGNOSIS — E871 Hypo-osmolality and hyponatremia: Principal | ICD-10-CM | POA: Diagnosis present

## 2022-12-07 DIAGNOSIS — R55 Syncope and collapse: Secondary | ICD-10-CM | POA: Diagnosis not present

## 2022-12-07 DIAGNOSIS — Z833 Family history of diabetes mellitus: Secondary | ICD-10-CM

## 2022-12-07 DIAGNOSIS — Z888 Allergy status to other drugs, medicaments and biological substances status: Secondary | ICD-10-CM

## 2022-12-07 DIAGNOSIS — D509 Iron deficiency anemia, unspecified: Secondary | ICD-10-CM | POA: Diagnosis not present

## 2022-12-07 DIAGNOSIS — F419 Anxiety disorder, unspecified: Secondary | ICD-10-CM | POA: Insufficient documentation

## 2022-12-07 DIAGNOSIS — K3189 Other diseases of stomach and duodenum: Secondary | ICD-10-CM | POA: Diagnosis present

## 2022-12-07 DIAGNOSIS — C50411 Malignant neoplasm of upper-outer quadrant of right female breast: Secondary | ICD-10-CM | POA: Diagnosis present

## 2022-12-07 DIAGNOSIS — T424X5A Adverse effect of benzodiazepines, initial encounter: Secondary | ICD-10-CM | POA: Diagnosis present

## 2022-12-07 DIAGNOSIS — Z87891 Personal history of nicotine dependence: Secondary | ICD-10-CM

## 2022-12-07 DIAGNOSIS — Z86010 Personal history of colonic polyps: Secondary | ICD-10-CM

## 2022-12-07 DIAGNOSIS — G928 Other toxic encephalopathy: Secondary | ICD-10-CM | POA: Diagnosis present

## 2022-12-07 DIAGNOSIS — E876 Hypokalemia: Secondary | ICD-10-CM

## 2022-12-07 DIAGNOSIS — F32A Depression, unspecified: Secondary | ICD-10-CM | POA: Insufficient documentation

## 2022-12-07 DIAGNOSIS — F1010 Alcohol abuse, uncomplicated: Secondary | ICD-10-CM | POA: Diagnosis present

## 2022-12-07 DIAGNOSIS — E878 Other disorders of electrolyte and fluid balance, not elsewhere classified: Secondary | ICD-10-CM | POA: Diagnosis present

## 2022-12-07 DIAGNOSIS — Z79899 Other long term (current) drug therapy: Secondary | ICD-10-CM

## 2022-12-07 DIAGNOSIS — Z7401 Bed confinement status: Secondary | ICD-10-CM | POA: Diagnosis not present

## 2022-12-07 DIAGNOSIS — K760 Fatty (change of) liver, not elsewhere classified: Secondary | ICD-10-CM | POA: Diagnosis not present

## 2022-12-07 DIAGNOSIS — I10 Essential (primary) hypertension: Secondary | ICD-10-CM | POA: Diagnosis not present

## 2022-12-07 DIAGNOSIS — L89152 Pressure ulcer of sacral region, stage 2: Secondary | ICD-10-CM | POA: Diagnosis present

## 2022-12-07 DIAGNOSIS — D62 Acute posthemorrhagic anemia: Secondary | ICD-10-CM | POA: Diagnosis present

## 2022-12-07 DIAGNOSIS — M810 Age-related osteoporosis without current pathological fracture: Secondary | ICD-10-CM | POA: Diagnosis present

## 2022-12-07 DIAGNOSIS — K264 Chronic or unspecified duodenal ulcer with hemorrhage: Secondary | ICD-10-CM | POA: Diagnosis present

## 2022-12-07 DIAGNOSIS — Z923 Personal history of irradiation: Secondary | ICD-10-CM | POA: Diagnosis not present

## 2022-12-07 DIAGNOSIS — H9190 Unspecified hearing loss, unspecified ear: Secondary | ICD-10-CM | POA: Diagnosis present

## 2022-12-07 DIAGNOSIS — K449 Diaphragmatic hernia without obstruction or gangrene: Secondary | ICD-10-CM | POA: Diagnosis present

## 2022-12-07 DIAGNOSIS — R4182 Altered mental status, unspecified: Secondary | ICD-10-CM | POA: Diagnosis not present

## 2022-12-07 DIAGNOSIS — Z853 Personal history of malignant neoplasm of breast: Secondary | ICD-10-CM

## 2022-12-07 DIAGNOSIS — M549 Dorsalgia, unspecified: Secondary | ICD-10-CM | POA: Diagnosis present

## 2022-12-07 DIAGNOSIS — Z8249 Family history of ischemic heart disease and other diseases of the circulatory system: Secondary | ICD-10-CM | POA: Diagnosis not present

## 2022-12-07 DIAGNOSIS — F411 Generalized anxiety disorder: Secondary | ICD-10-CM | POA: Diagnosis present

## 2022-12-07 DIAGNOSIS — M150 Primary generalized (osteo)arthritis: Secondary | ICD-10-CM | POA: Diagnosis not present

## 2022-12-07 DIAGNOSIS — R748 Abnormal levels of other serum enzymes: Secondary | ICD-10-CM | POA: Diagnosis not present

## 2022-12-07 DIAGNOSIS — K922 Gastrointestinal hemorrhage, unspecified: Secondary | ICD-10-CM | POA: Insufficient documentation

## 2022-12-07 DIAGNOSIS — E538 Deficiency of other specified B group vitamins: Secondary | ICD-10-CM | POA: Diagnosis not present

## 2022-12-07 DIAGNOSIS — I959 Hypotension, unspecified: Secondary | ICD-10-CM | POA: Diagnosis not present

## 2022-12-07 DIAGNOSIS — I6782 Cerebral ischemia: Secondary | ICD-10-CM | POA: Diagnosis not present

## 2022-12-07 DIAGNOSIS — L8990 Pressure ulcer of unspecified site, unspecified stage: Secondary | ICD-10-CM | POA: Insufficient documentation

## 2022-12-07 DIAGNOSIS — R944 Abnormal results of kidney function studies: Secondary | ICD-10-CM | POA: Diagnosis present

## 2022-12-07 DIAGNOSIS — G8929 Other chronic pain: Secondary | ICD-10-CM | POA: Diagnosis present

## 2022-12-07 DIAGNOSIS — Y92009 Unspecified place in unspecified non-institutional (private) residence as the place of occurrence of the external cause: Secondary | ICD-10-CM

## 2022-12-07 DIAGNOSIS — Z170 Estrogen receptor positive status [ER+]: Secondary | ICD-10-CM

## 2022-12-07 DIAGNOSIS — K8020 Calculus of gallbladder without cholecystitis without obstruction: Secondary | ICD-10-CM | POA: Diagnosis not present

## 2022-12-07 DIAGNOSIS — Z79891 Long term (current) use of opiate analgesic: Secondary | ICD-10-CM

## 2022-12-07 DIAGNOSIS — G894 Chronic pain syndrome: Secondary | ICD-10-CM | POA: Diagnosis not present

## 2022-12-07 DIAGNOSIS — R5381 Other malaise: Secondary | ICD-10-CM | POA: Diagnosis present

## 2022-12-07 DIAGNOSIS — F1090 Alcohol use, unspecified, uncomplicated: Secondary | ICD-10-CM | POA: Diagnosis not present

## 2022-12-07 DIAGNOSIS — R9089 Other abnormal findings on diagnostic imaging of central nervous system: Secondary | ICD-10-CM | POA: Diagnosis not present

## 2022-12-07 DIAGNOSIS — R000 Tachycardia, unspecified: Secondary | ICD-10-CM | POA: Diagnosis not present

## 2022-12-07 DIAGNOSIS — K838 Other specified diseases of biliary tract: Secondary | ICD-10-CM | POA: Diagnosis not present

## 2022-12-07 DIAGNOSIS — D649 Anemia, unspecified: Secondary | ICD-10-CM | POA: Diagnosis not present

## 2022-12-07 LAB — CBC WITH DIFFERENTIAL/PLATELET
Abs Immature Granulocytes: 0.19 10*3/uL — ABNORMAL HIGH (ref 0.00–0.07)
Basophils Absolute: 0.1 10*3/uL (ref 0.0–0.1)
Basophils Relative: 0 %
Eosinophils Absolute: 0 10*3/uL (ref 0.0–0.5)
Eosinophils Relative: 0 %
HCT: 22.7 % — ABNORMAL LOW (ref 36.0–46.0)
Hemoglobin: 7.9 g/dL — ABNORMAL LOW (ref 12.0–15.0)
Immature Granulocytes: 2 %
Lymphocytes Relative: 15 %
Lymphs Abs: 1.9 10*3/uL (ref 0.7–4.0)
MCH: 33.3 pg (ref 26.0–34.0)
MCHC: 34.8 g/dL (ref 30.0–36.0)
MCV: 95.8 fL (ref 80.0–100.0)
Monocytes Absolute: 0.5 10*3/uL (ref 0.1–1.0)
Monocytes Relative: 4 %
Neutro Abs: 10.2 10*3/uL — ABNORMAL HIGH (ref 1.7–7.7)
Neutrophils Relative %: 79 %
Platelets: 250 10*3/uL (ref 150–400)
RBC: 2.37 MIL/uL — ABNORMAL LOW (ref 3.87–5.11)
RDW: 12.9 % (ref 11.5–15.5)
WBC: 13 10*3/uL — ABNORMAL HIGH (ref 4.0–10.5)
nRBC: 0 % (ref 0.0–0.2)

## 2022-12-07 LAB — BASIC METABOLIC PANEL
Anion gap: 5 (ref 5–15)
Anion gap: 6 (ref 5–15)
Anion gap: 7 (ref 5–15)
BUN: 24 mg/dL — ABNORMAL HIGH (ref 8–23)
BUN: 24 mg/dL — ABNORMAL HIGH (ref 8–23)
BUN: 24 mg/dL — ABNORMAL HIGH (ref 8–23)
CO2: 22 mmol/L (ref 22–32)
CO2: 22 mmol/L (ref 22–32)
CO2: 21 mmol/L — ABNORMAL LOW (ref 22–32)
Calcium: 7.5 mg/dL — ABNORMAL LOW (ref 8.9–10.3)
Calcium: 7.5 mg/dL — ABNORMAL LOW (ref 8.9–10.3)
Calcium: 7.6 mg/dL — ABNORMAL LOW (ref 8.9–10.3)
Chloride: 95 mmol/L — ABNORMAL LOW (ref 98–111)
Chloride: 95 mmol/L — ABNORMAL LOW (ref 98–111)
Chloride: 97 mmol/L — ABNORMAL LOW (ref 98–111)
Creatinine, Ser: 0.64 mg/dL (ref 0.44–1.00)
Creatinine, Ser: 0.66 mg/dL (ref 0.44–1.00)
Creatinine, Ser: 0.74 mg/dL (ref 0.44–1.00)
GFR, Estimated: >60 mL/min (ref >60)
GFR, Estimated: >60 mL/min (ref >60)
GFR, Estimated: >60 mL/min (ref >60)
Glucose, Bld: 97 mg/dL (ref 70–99)
Glucose, Bld: 101 mg/dL — ABNORMAL HIGH (ref 70–99)
Glucose, Bld: 103 mg/dL — ABNORMAL HIGH (ref 70–99)
Potassium: 3.6 mmol/L (ref 3.5–5.1)
Potassium: 3.7 mmol/L (ref 3.5–5.1)
Potassium: 3.5 mmol/L (ref 3.5–5.1)
Sodium: 122 mmol/L — ABNORMAL LOW (ref 135–145)
Sodium: 123 mmol/L — ABNORMAL LOW (ref 135–145)
Sodium: 125 mmol/L — ABNORMAL LOW (ref 135–145)

## 2022-12-07 LAB — CBC
HCT: 16.7 % — ABNORMAL LOW (ref 36.0–46.0)
HCT: 24.1 % — ABNORMAL LOW (ref 36.0–46.0)
Hemoglobin: 6 g/dL — ABNORMAL LOW (ref 12.0–15.0)
Hemoglobin: 8.4 g/dL — ABNORMAL LOW (ref 12.0–15.0)
MCH: 33.9 pg (ref 26.0–34.0)
MCH: 31.1 pg (ref 26.0–34.0)
MCHC: 35.9 g/dL (ref 30.0–36.0)
MCHC: 34.9 g/dL (ref 30.0–36.0)
MCV: 94.4 fL (ref 80.0–100.0)
MCV: 89.3 fL (ref 80.0–100.0)
Platelets: 170 10*3/uL (ref 150–400)
Platelets: 151 10*3/uL (ref 150–400)
RBC: 1.77 MIL/uL — ABNORMAL LOW (ref 3.87–5.11)
RBC: 2.7 MIL/uL — ABNORMAL LOW (ref 3.87–5.11)
RDW: 12.8 % (ref 11.5–15.5)
RDW: 16.3 % — ABNORMAL HIGH (ref 11.5–15.5)
WBC: 11.3 10*3/uL — ABNORMAL HIGH (ref 4.0–10.5)
WBC: 6.9 10*3/uL (ref 4.0–10.5)
nRBC: 0 % (ref 0.0–0.2)
nRBC: 0 % (ref 0.0–0.2)

## 2022-12-07 LAB — URINALYSIS, ROUTINE W REFLEX MICROSCOPIC
Bilirubin Urine: NEGATIVE
Glucose, UA: NEGATIVE mg/dL
Hgb urine dipstick: NEGATIVE
Ketones, ur: NEGATIVE mg/dL
Leukocytes,Ua: NEGATIVE
Nitrite: NEGATIVE
Protein, ur: NEGATIVE mg/dL
Specific Gravity, Urine: 1.019 (ref 1.005–1.030)
pH: 5 (ref 5.0–8.0)

## 2022-12-07 LAB — TSH: TSH: 3.054 u[IU]/mL (ref 0.350–4.500)

## 2022-12-07 LAB — COMPREHENSIVE METABOLIC PANEL
ALT: 47 U/L — ABNORMAL HIGH (ref 0–44)
AST: 89 U/L — ABNORMAL HIGH (ref 15–41)
Albumin: 2.6 g/dL — ABNORMAL LOW (ref 3.5–5.0)
Alkaline Phosphatase: 79 U/L (ref 38–126)
Anion gap: 10 (ref 5–15)
BUN: 26 mg/dL — ABNORMAL HIGH (ref 8–23)
CO2: 22 mmol/L (ref 22–32)
Calcium: 8 mg/dL — ABNORMAL LOW (ref 8.9–10.3)
Chloride: 89 mmol/L — ABNORMAL LOW (ref 98–111)
Creatinine, Ser: 0.81 mg/dL (ref 0.44–1.00)
GFR, Estimated: >60 mL/min (ref >60)
Glucose, Bld: 181 mg/dL — ABNORMAL HIGH (ref 70–99)
Potassium: 3.4 mmol/L — ABNORMAL LOW (ref 3.5–5.1)
Sodium: 121 mmol/L — ABNORMAL LOW (ref 135–145)
Total Bilirubin: 1 mg/dL (ref 0.3–1.2)
Total Protein: 5.2 g/dL — ABNORMAL LOW (ref 6.5–8.1)

## 2022-12-07 LAB — NA AND K (SODIUM & POTASSIUM), RAND UR
Potassium Urine: 63 mmol/L
Sodium, Ur: <10 mmol/L

## 2022-12-07 LAB — HEMOGLOBIN AND HEMATOCRIT, BLOOD
HCT: 21 % — ABNORMAL LOW (ref 36.0–46.0)
Hemoglobin: 7.3 g/dL — ABNORMAL LOW (ref 12.0–15.0)

## 2022-12-07 LAB — URINE DRUG SCREEN, QUALITATIVE (ARMC ONLY)
Amphetamines, Ur Screen: NOT DETECTED
Barbiturates, Ur Screen: NOT DETECTED
Benzodiazepine, Ur Scrn: NOT DETECTED
Cannabinoid 50 Ng, Ur ~~LOC~~: NOT DETECTED
Cocaine Metabolite,Ur ~~LOC~~: NOT DETECTED
MDMA (Ecstasy)Ur Screen: NOT DETECTED
Methadone Scn, Ur: NOT DETECTED
Opiate, Ur Screen: NOT DETECTED
Phencyclidine (PCP) Ur S: NOT DETECTED
Tricyclic, Ur Screen: NOT DETECTED

## 2022-12-07 LAB — CK: Total CK: 40 U/L (ref 38–234)

## 2022-12-07 LAB — APTT: aPTT: 36 seconds (ref 24–36)

## 2022-12-07 LAB — CBG MONITORING, ED: Glucose-Capillary: 97 mg/dL (ref 70–99)

## 2022-12-07 LAB — SALICYLATE LEVEL: Salicylate Lvl: <7 mg/dL — ABNORMAL LOW (ref 7.0–30.0)

## 2022-12-07 LAB — OSMOLALITY: Osmolality: 260 mOsm/kg — ABNORMAL LOW (ref 275–295)

## 2022-12-07 LAB — PROTIME-INR
INR: 1.1 (ref 0.8–1.2)
Prothrombin Time: 14.5 seconds (ref 11.4–15.2)

## 2022-12-07 LAB — OSMOLALITY, URINE: Osmolality, Ur: 589 mOsm/kg (ref 300–900)

## 2022-12-07 LAB — MAGNESIUM: Magnesium: 1.9 mg/dL (ref 1.7–2.4)

## 2022-12-07 LAB — ETHANOL: Alcohol, Ethyl (B): <10 mg/dL (ref <10)

## 2022-12-07 LAB — AMMONIA: Ammonia: <10 umol/L (ref 9–35)

## 2022-12-07 LAB — HEMOGLOBIN: Hemoglobin: 6.1 g/dL — ABNORMAL LOW (ref 12.0–15.0)

## 2022-12-07 LAB — PREPARE RBC (CROSSMATCH)

## 2022-12-07 LAB — LIPASE, BLOOD: Lipase: 26 U/L (ref 11–51)

## 2022-12-07 LAB — ACETAMINOPHEN LEVEL: Acetaminophen (Tylenol), Serum: <10 ug/mL — ABNORMAL LOW (ref 10–30)

## 2022-12-07 MED ORDER — ADULT MULTIVITAMIN W/MINERALS CH
1.0000 | ORAL_TABLET | Freq: Every day | ORAL | Status: DC
  Administered 2022-12-08 – 2022-12-11 (×4): 1 via ORAL
  Filled 2022-12-07 (×4): qty 1

## 2022-12-07 MED ORDER — ADULT MULTIVITAMIN W/MINERALS CH: 1.0000 | ORAL_TABLET | Freq: Once | ORAL | Status: DC

## 2022-12-07 MED ORDER — THIAMINE HCL 100 MG/ML IJ SOLN
500.0000 mg | Freq: Once | INTRAVENOUS | Status: AC
  Administered 2022-12-07: 500 mg via INTRAVENOUS
  Filled 2022-12-07: qty 5

## 2022-12-07 MED ORDER — ACETAMINOPHEN 325 MG PO TABS: 650.0000 mg | ORAL_TABLET | Freq: Four times a day (QID) | ORAL | Status: DC | PRN

## 2022-12-07 MED ORDER — DULOXETINE HCL 30 MG PO CPEP
120.0000 mg | ORAL_CAPSULE | Freq: Every day | ORAL | Status: DC
  Administered 2022-12-07: 120 mg via ORAL
  Filled 2022-12-07: qty 2

## 2022-12-07 MED ORDER — SODIUM CHLORIDE 0.9% IV SOLUTION
Freq: Once | INTRAVENOUS | Status: DC
  Filled 2022-12-07: qty 250

## 2022-12-07 MED ORDER — VITAMIN B-12 1000 MCG PO TABS
1000.0000 ug | ORAL_TABLET | Freq: Every day | ORAL | Status: DC
  Administered 2022-12-07 – 2022-12-11 (×5): 1000 ug via ORAL
  Filled 2022-12-07: qty 1
  Filled 2022-12-07: qty 2
  Filled 2022-12-07 (×3): qty 1

## 2022-12-07 MED ORDER — THIAMINE MONONITRATE 100 MG PO TABS: 100.0000 mg | ORAL_TABLET | Freq: Every day | ORAL | Status: DC

## 2022-12-07 MED ORDER — LORAZEPAM 1 MG PO TABS: 1.0000 mg | ORAL_TABLET | ORAL | Status: AC | PRN

## 2022-12-07 MED ORDER — TRAZODONE HCL 50 MG PO TABS
25.0000 mg | ORAL_TABLET | Freq: Every evening | ORAL | Status: DC | PRN
  Administered 2022-12-07: 25 mg via ORAL
  Filled 2022-12-07: qty 1

## 2022-12-07 MED ORDER — ONDANSETRON HCL 4 MG/2ML IJ SOLN: 4.0000 mg | Freq: Four times a day (QID) | INTRAMUSCULAR | Status: DC | PRN

## 2022-12-07 MED ORDER — ARIPIPRAZOLE 2 MG PO TABS
2.0000 mg | ORAL_TABLET | Freq: Every day | ORAL | Status: DC
  Administered 2022-12-07 – 2022-12-11 (×5): 2 mg via ORAL
  Filled 2022-12-07 (×6): qty 1

## 2022-12-07 MED ORDER — SODIUM CHLORIDE 0.9% FLUSH
3.0000 mL | Freq: Two times a day (BID) | INTRAVENOUS | Status: DC
  Administered 2022-12-07 – 2022-12-11 (×9): 3 mL via INTRAVENOUS

## 2022-12-07 MED ORDER — ADULT MULTIVITAMIN W/MINERALS CH
1.0000 | ORAL_TABLET | Freq: Once | ORAL | Status: DC
  Administered 2022-12-07: 1 via ORAL
  Filled 2022-12-07: qty 1

## 2022-12-07 MED ORDER — LOPERAMIDE HCL 2 MG PO CAPS: 2.0000 mg | ORAL_CAPSULE | Freq: Four times a day (QID) | ORAL | Status: DC | PRN

## 2022-12-07 MED ORDER — GADOBUTROL 1 MMOL/ML IV SOLN
5.0000 mL | Freq: Once | INTRAVENOUS | Status: AC | PRN
  Administered 2022-12-07: 5 mL via INTRAVENOUS

## 2022-12-07 MED ORDER — LORAZEPAM 2 MG/ML IJ SOLN: 1.0000 mg | INTRAMUSCULAR | Status: AC | PRN

## 2022-12-07 MED ORDER — PANTOPRAZOLE SODIUM 40 MG IV SOLR
40.0000 mg | Freq: Two times a day (BID) | INTRAVENOUS | Status: DC
  Administered 2022-12-07 – 2022-12-08 (×2): 40 mg via INTRAVENOUS
  Filled 2022-12-07 (×2): qty 10

## 2022-12-07 MED ORDER — LOSARTAN POTASSIUM 50 MG PO TABS
25.0000 mg | ORAL_TABLET | Freq: Two times a day (BID) | ORAL | Status: DC
  Administered 2022-12-07: 25 mg via ORAL
  Filled 2022-12-07: qty 1

## 2022-12-07 MED ORDER — LACTATED RINGERS IV BOLUS
1000.0000 mL | Freq: Once | INTRAVENOUS | Status: AC
  Administered 2022-12-07: 1000 mL via INTRAVENOUS

## 2022-12-07 MED ORDER — THIAMINE HCL 100 MG/ML IJ SOLN
Freq: Once | INTRAVENOUS | Status: DC
Start: 2022-12-07 — End: 2022-12-07

## 2022-12-07 MED ORDER — TAMOXIFEN CITRATE 10 MG PO TABS
10.0000 mg | ORAL_TABLET | ORAL | Status: DC
  Administered 2022-12-07 – 2022-12-11 (×3): 10 mg via ORAL
  Filled 2022-12-07 (×3): qty 1

## 2022-12-07 MED ORDER — ALPRAZOLAM 0.25 MG PO TABS: 0.2500 mg | ORAL_TABLET | Freq: Every evening | ORAL | Status: DC | PRN

## 2022-12-07 MED ORDER — PANTOPRAZOLE SODIUM 40 MG IV SOLR
40.0000 mg | Freq: Once | INTRAVENOUS | Status: AC
  Administered 2022-12-07: 40 mg via INTRAVENOUS
  Filled 2022-12-07: qty 10

## 2022-12-07 MED ORDER — SODIUM CHLORIDE 0.9 % IV BOLUS
500.0000 mL | Freq: Once | INTRAVENOUS | Status: AC
  Administered 2022-12-07: 500 mL via INTRAVENOUS

## 2022-12-07 MED ORDER — LORAZEPAM 2 MG/ML IJ SOLN: 1.0000 mg | INTRAMUSCULAR | Status: DC | PRN

## 2022-12-07 MED ORDER — FOLIC ACID 1 MG PO TABS
1.0000 mg | ORAL_TABLET | Freq: Every day | ORAL | Status: DC
  Administered 2022-12-07 – 2022-12-11 (×5): 1 mg via ORAL
  Filled 2022-12-07 (×5): qty 1

## 2022-12-07 MED ORDER — THIAMINE HCL 100 MG/ML IJ SOLN: 100.0000 mg | Freq: Every day | INTRAMUSCULAR | Status: DC

## 2022-12-07 MED ORDER — DULOXETINE HCL 30 MG PO CPEP
60.0000 mg | ORAL_CAPSULE | Freq: Every day | ORAL | Status: DC
  Administered 2022-12-08 – 2022-12-11 (×4): 60 mg via ORAL
  Filled 2022-12-07 (×4): qty 2

## 2022-12-07 MED ORDER — ONDANSETRON HCL 4 MG PO TABS: 4.0000 mg | ORAL_TABLET | Freq: Four times a day (QID) | ORAL | Status: DC | PRN

## 2022-12-07 MED ORDER — FOLIC ACID 1 MG PO TABS: 1.0000 mg | ORAL_TABLET | Freq: Every day | ORAL | Status: DC

## 2022-12-07 MED ORDER — ACETAMINOPHEN 650 MG RE SUPP: 650.0000 mg | Freq: Four times a day (QID) | RECTAL | Status: DC | PRN

## 2022-12-07 MED ORDER — ENOXAPARIN SODIUM 40 MG/0.4ML IJ SOSY
40.0000 mg | PREFILLED_SYRINGE | INTRAMUSCULAR | Status: DC
  Filled 2022-12-07: qty 0.4

## 2022-12-07 MED ORDER — MAGNESIUM HYDROXIDE 400 MG/5ML PO SUSP
30.0000 mL | Freq: Every day | ORAL | Status: DC | PRN
  Filled 2022-12-07: qty 30

## 2022-12-07 MED ORDER — THIAMINE HCL 100 MG/ML IJ SOLN
100.0000 mg | Freq: Every day | INTRAMUSCULAR | Status: DC
  Administered 2022-12-10 – 2022-12-11 (×2): 100 mg via INTRAVENOUS
  Filled 2022-12-07 (×2): qty 2

## 2022-12-07 MED ORDER — THIAMINE HCL 100 MG/ML IJ SOLN
500.0000 mg | Freq: Three times a day (TID) | INTRAVENOUS | Status: AC
  Administered 2022-12-07 – 2022-12-09 (×8): 500 mg via INTRAVENOUS
  Filled 2022-12-07 (×8): qty 5

## 2022-12-07 MED ORDER — PANTOPRAZOLE SODIUM 40 MG PO TBEC
40.0000 mg | DELAYED_RELEASE_TABLET | Freq: Two times a day (BID) | ORAL | Status: DC
  Administered 2022-12-07: 40 mg via ORAL
  Filled 2022-12-07: qty 1

## 2022-12-07 MED ORDER — POTASSIUM CHLORIDE IN NACL 20-0.9 MEQ/L-% IV SOLN
INTRAVENOUS | Status: DC
  Filled 2022-12-07 (×8): qty 1000

## 2022-12-07 NOTE — Progress Notes (Signed)
Eeg done

## 2022-12-07 NOTE — Discharge Instructions (Signed)
Substance resources added.

## 2022-12-07 NOTE — Procedures (Signed)
Patient Name: AYLINA CHIZMAR  MRN: [id number]  Epilepsy Attending: Charlsie Quest  Referring Physician/Provider: Hannah Beat, MD  Date: 12/07/2022 Duration: 27.31 mins  Patient history: 82yo F with ams getting eeg to evaluate for seizure  Level of alertness: Awake   AEDs during EEG study: None  Technical aspects: This EEG study was done with scalp electrodes positioned according to the 10-20 International system of electrode placement. Electrical activity was reviewed with band pass filter of 1-70Hz , sensitivity of 7 uV/mm, display speed of 73mm/sec with a 60Hz  notched filter applied as appropriate. EEG data were recorded continuously and digitally stored.  Video monitoring was available and reviewed as appropriate.  Description: The posterior dominant rhythm consists of 7 Hz activity of moderate voltage (25-35 uV) seen predominantly in posterior head regions, symmetric and reactive to eye opening and eye closing. EEG showed continuous generalized 6 to 7 Hz theta slowing. Hyperventilation and photic stimulation were not performed.     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 No seizures or epileptiform discharges were seen throughout the recording.   Berlin Viereck Annabelle Harman

## 2022-12-07 NOTE — TOC Progression Note (Signed)
Transition of Care [facility]) - Progression Note    Patient Details  Name: Marilyn Haas MRN: [id number] Date of Birth: 1940/12/23  Transition of [facility]) CM/SW Contact  Marquita Palms, LCSW Phone Number: 12/07/2022, 11:00 AM  Clinical Narrative:     CSW met with patient bedside.P  Expected Discharge Plan: Home w Home Health Services Barriers to Discharge: No Barriers Identified  Expected Discharge Plan and Services                                               Social Determinants of Health (SDOH) Interventions SDOH Screenings   Food Insecurity: No Food Insecurity (09/25/2022)  Housing: Low Risk  (09/25/2022)  Transportation Needs: No Transportation Needs (10/26/2022)  Utilities: Not At Risk (03/23/2022)  Alcohol Screen: Low Risk  (03/23/2022)  Depression (PHQ2-9): Medium Risk (03/23/2022)  Financial Resource Strain: Low Risk  (10/26/2022)  Physical Activity: Inactive (03/23/2022)  Social Connections: Socially Isolated (03/23/2022)  Stress: No Stress Concern Present (03/23/2022)  Tobacco Use: Medium Risk (12/07/2022)    Readmission Risk Interventions    12/07/2022   10:54 AM  Readmission Risk Prevention Plan  Transportation Screening Complete  PCP or Specialist Appt within 3-5 Days Complete  HRI or Home Care Consult Complete  Social Work Consult for Recovery Care Planning/Counseling Complete  Palliative Care Screening Not Applicable  Medication Review Oceanographer) Not Complete  Med Review Comments Will be reviewed by charge nurse upon discharge

## 2022-12-07 NOTE — Assessment & Plan Note (Signed)
-   This could be contributing to her altered mental status with confusion. - The patient will be admitted to a medical telemetry bed. - Will obtain hyponatremia workup. - She will be hydrated with IV normal saline. - Will follow BMPs. - We will hold off diuretic therapy.

## 2022-12-07 NOTE — Assessment & Plan Note (Signed)
- 

## 2022-12-07 NOTE — Assessment & Plan Note (Signed)
-   We will continue antihypertensive therapy.

## 2022-12-07 NOTE — Assessment & Plan Note (Signed)
Continue tamoxifen

## 2022-12-07 NOTE — ED Notes (Signed)
EEG at bedside.

## 2022-12-07 NOTE — Assessment & Plan Note (Addendum)
-   Per her son she was tense and stiff raising the possibility of seizure that could be alcohol withdrawal. - Will be placed on a banana bag daily and seizure precautions. - EEG will be obtained. - Neurology consult to be obtained.

## 2022-12-07 NOTE — ED Notes (Signed)
Patient transported to CT 

## 2022-12-07 NOTE — Plan of Care (Signed)

## 2022-12-07 NOTE — Consult Note (Signed)
Neurology Consultation Reason for Consult: seizure-like activity  Requesting Physician: Shonna Chock   CC: Altered mental status, possible seizure   History is obtained from: Sons at bedside, limited from patient, chart review   HPI: Marilyn Haas is a 82 y.o. female with a past medical history significant for daily alcohol use, depression on Abilify, chronic pain on chronic opiates, intermittent diarrhea, history of GI bleed and anemia, breast cancer on tamoxifen s/p surgery in 2018 and a lumpectomy in late 2022 with some atypia, very hard of hearing at baseline  History is primarily provided by sons at bedside who note that she has had a continued decline over the last few months.  They also note she tends to do better in the hospital and in rehab when she is in a monitored environment and getting her medications as scheduled with limited access to alcohol.  She has been very altered and confused in the send initially come by at 5:15 PM.  Later when he came back to help her walk with a walker she began to look panicked and had some generalized convulsive activity without tongue bite or loss of bowel or bladder.  She was having a lot of dry heaving without emesis.  Her mental status is markedly improved with supportive care and hospitalization including blood transfusion for hemoglobin of 6  Family denies any known risk factors for seizure including any family history of seizure, known personal history of seizures for the patient, history of meningitis/encephalitis, history of significant TBI  Family is unsure if she regularly gets her psychiatric medications.  They note she did stop Xanax 4 to 5 months ago which was tapered gradually but they suspect that she increased her alcohol use currently decompensate.  She additionally is on Cymbalta which they are concerned may not be getting regularly consumed.  They did also have her dose of which has seemed to improve her ambulation  On review of PDMP  records her Ambien 10 mg nightly does get regularly filled as does hydrocodone-acetaminophen 10-325)  ROS: Limited mental status, obtained from family as above  Past Medical History:  Diagnosis Date   Anemia    Anxiety    Arthritis    Breast cancer (HCC) 05/19/2016   Right breast   Chronic back pain    Scoliosis, stenosis   Depression    Diverticulosis    GERD (gastroesophageal reflux disease)    Hard of hearing    History of blood transfusion    History of colon polyps    History of hiatal hernia    History of pneumonia    Hypertension    Insomnia    Osteopenia    Osteoporosis    Personal history of radiation therapy    Thickened endometrium 09/29/2017   Past Surgical History:  Procedure Laterality Date   BIOPSY  02/07/2022   Procedure: BIOPSY;  Surgeon: Dolores Frame, MD;  [facility]: [facility];  Service: Gastroenterology;;   BREAST LUMPECTOMY WITH RADIOACTIVE SEED AND SENTINEL LYMPH NODE BIOPSY Right 07/31/2016   Procedure: BREAST LUMPECTOMY WITH RADIOACTIVE SEED AND SENTINEL LYMPH NODE BIOPSY, INJECT BLUE DYE RIGHT BREAST;  Surgeon: Claud Kelp, MD;  Location: [facility];  Service: General;  Laterality: Right;   BREAST LUMPECTOMY WITH RADIOACTIVE SEED LOCALIZATION Left 05/07/2021   Procedure: LEFT BREAST LUMPECTOMY WITH RADIOACTIVE SEED LOCALIZATION;  Surgeon: Harriette Bouillon, MD;  Location: [facility];  Service: General;  Laterality: Left;   COLONOSCOPY     CONVERSION TO  TOTAL HIP Left 07/04/2014   Procedure: LEFT CONVERSION TO TOTAL HIP ARTHROPLASTY;  Surgeon: Ollen Gross, MD;  Location: [facility];  Service: Orthopedics;  Laterality: Left;   ESOPHAGEAL DILATION N/A 09/24/2017   Procedure: ESOPHAGEAL DILATION;  Surgeon: Malissa Hippo, MD;  [facility]: [facility];  Service: Endoscopy;  Laterality: N/A;   ESOPHAGEAL DILATION N/A 01/19/2020   Procedure: ESOPHAGEAL DILATION;  Surgeon: Dolores Frame, MD;  [facility]: [facility];   Service: Gastroenterology;  Laterality: N/A;   ESOPHAGEAL DILATION  02/28/2020   Procedure: ESOPHAGEAL DILATION;  Surgeon: Malissa Hippo, MD;  [facility];  Service: Endoscopy;;   ESOPHAGOGASTRODUODENOSCOPY N/A 01/26/2014   Procedure: ESOPHAGOGASTRODUODENOSCOPY (EGD);  Surgeon: Malissa Hippo, MD;  [facility];  Service: Endoscopy;  Laterality: N/A;  1030   ESOPHAGOGASTRODUODENOSCOPY (EGD) WITH PROPOFOL N/A 09/24/2017   Procedure: ESOPHAGOGASTRODUODENOSCOPY (EGD) WITH PROPOFOL;  Surgeon: Malissa Hippo, MD;  [facility];  Service: Endoscopy;  Laterality: N/A;  11:15   ESOPHAGOGASTRODUODENOSCOPY (EGD) WITH PROPOFOL N/A 01/19/2020   Procedure: ESOPHAGOGASTRODUODENOSCOPY (EGD) WITH PROPOFOL;  Surgeon: Dolores Frame, MD;  [facility];  Service: Gastroenterology;  Laterality: N/A;  1200   ESOPHAGOGASTRODUODENOSCOPY (EGD) WITH PROPOFOL N/A 02/28/2020   Procedure: ESOPHAGOGASTRODUODENOSCOPY (EGD) WITH PROPOFOL;  Surgeon: Malissa Hippo, MD;  [facility];  Service: Endoscopy;  Laterality: N/A;  2:45   ESOPHAGOGASTRODUODENOSCOPY (EGD) WITH PROPOFOL N/A 02/07/2022   Procedure: ESOPHAGOGASTRODUODENOSCOPY (EGD) WITH PROPOFOL;  Surgeon: Dolores Frame, MD;  [facility];  Service: Gastroenterology;  Laterality: N/A;   HIP FRACTURE SURGERY     05/2009 left -[facility]   HYSTEROSCOPY WITH D & C  05/24/2018   Procedure: DILATATION AND CURETTAGE /HYSTEROSCOPY (PROCEDURE # 2)/PAP SMEAR (PROCEDURE #1);  Surgeon: Tilda Burrow, MD;  Location: [facility];  Service: Gynecology;;   [name] DILATION N/A 01/26/2014   Procedure: Alvy Beal;  Surgeon: Malissa Hippo, MD;  [facility];  Service: Endoscopy;  Laterality: N/A;   Current Outpatient Medications  Medication Instructions   acetaminophen (TYLENOL) 650 mg, Oral, Every 6 hours PRN   ALPRAZolam (XANAX) 0.25 mg, Oral, At bedtime PRN   ARIPiprazole  (ABILIFY) 2 mg, Oral, Daily   Cholecalciferol (VITAMIN D3) 125 MCG (5000 UT) CAPS 1 capsule, Daily   cyanocobalamin 1,000 mcg, Oral, Daily   DULoxetine (CYMBALTA) 120 mg, Oral, Daily   ferrous sulfate 325 mg, Oral, Daily with breakfast   folic acid (FOLVITE) 1 mg, Oral, Daily   furosemide (LASIX) 20 mg, Oral, Daily PRN   loperamide (IMODIUM) 2 mg, Oral, 4 times daily PRN   losartan (COZAAR) 25 mg, Oral, 2 times daily   pantoprazole (PROTONIX) 40 mg, Oral, 2 times daily   tamoxifen (NOLVADEX) 10 MG tablet TAKE ONE TABLET (10MG  TOTAL) BY MOUTH EVERY OTHER DAY   tamoxifen (NOLVADEX) 10 mg, Oral, Every other day   thiamine (VITAMIN B-1) 100 mg, Oral, Daily   zolpidem (AMBIEN) 5 mg, Oral, Daily at bedtime     Family History  Problem Relation Age of Onset   Heart failure Mother    Diabetes Mother    Hypertension Mother    Depression Mother    Other Father        stomach ulcers; abd aneursym   Hypertension Sister    Heart failure Sister    Diabetes Sister    Depression Sister    Diabetes Sister        prediabetes  Cancer Maternal Aunt        breast   Social History:  reports that she quit smoking about 61 years ago. Her smoking use included cigarettes. She started smoking about 63 years ago. She has a 1 pack-year smoking history. She has been exposed to tobacco smoke. She has never used smokeless tobacco. She reports current alcohol use of about 15.0 standard drinks of alcohol per week. She reports that she does not use drugs.   Exam: Current vital signs: BP (!) 87/53   Pulse 80   Temp 98 F (36.7 C) (Oral)   Resp 15   Ht 5\' 1"  (1.549 m)   Wt 55.8 kg   SpO2 100%   BMI 23.24 kg/m  Vital signs in last 24 hours: Temp:  [97.4 F (36.3 C)-98 F (36.7 C)] 98 F (36.7 C) (09/23 1441) Pulse Rate:  [79-120] 80 (09/23 1441) Resp:  [12-18] 15 (09/23 1441) BP: (84-127)/(51-80) 87/53 (09/23 1441) SpO2:  [100 %] 100 % (09/23 1441) Weight:  [55.8 kg] 55.8 kg (09/23  0027)   Physical Exam  Constitutional: Appears well-developed and well-nourished.  Good coloration and examined shortly after blood transfusion Psych: Affect pleasant and cooperative Eyes: No scleral injection HENT: No oropharyngeal obstruction.   MSK: no major joint deformities.  Cardiovascular: Normal rate and regular rhythm.  Respiratory: Effort normal, non-labored breathing GI: Soft.  No distension. There is no tenderness.  Skin: Warm dry and intact visible skin  Neuro: Mental Status: Patient is awake, alert, oriented to person, age but not cannot explain situation and not oriented to place No signs of aphasia or neglect Cranial Nerves: II: Visual Fields are full. Pupils are equal, round, and reactive to light.   III,IV, VI: EOMI without ptosis or diploplia.  Left eye does have a wider palpebral fissure opening than the right V: Facial sensation is symmetric to light touch VII: Facial movement is symmetric.  VIII: Extremely hard of hearing at baseline, hearing aids not available at the time of examination X: Uvula elevates symmetrically XI: Shoulder shrug is symmetric. XII: tongue is midline without atrophy or fasciculations.  Motor: 5/5 strength was present in all four extremities, except for mild bilateral hip weakness more on the right than the left Sensory: Sensation is symmetric to light touch in the arms and legs with length dependent loss of temperature sensation in all 4 extremities Deep Tendon Reflexes: 1+ and symmetric in the brachioradialis and patellae.  Cerebellar: Finger-to-nose intact bilaterally Gait:  Deferred in acute setting   I have reviewed labs in epic and the results pertinent to this consultation are: Initial sodium 122 with a osmolality of 260 Leukocytosis to 11.3 Uremia to 24 with baseline normal BUN without AKI AST elevated at 89, ALT elevated at 47   I have reviewed the images obtained:   Head CT personally reviewed agree with radiology,  changes consistent with chronic microvascular disease  MRI brain with and without contrast personally reviewed and reviewed with family at bedside, agree with radiology report 1. No acute or reversible finding. 2. 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 No acute [intracranial abnormality]   Assessment: Event as described potentially post syncopal convulsion, provoked seizure secondary to alcohol withdrawal and/or hyponatremia, no focality to the event.  Anemia may be contributing to symptomatic hypoperfusion of the brain, and query whether it also explains the uremia on her labs given no significant AKI.  Her liver enzyme pattern is consistent with alcohol use disorder  and I suspect this is also what is driving her hyponatremia but appreciate workup/management of the same per primary team.  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, neuropathy, remote pontine stroke  Recommendations:  #Seizure-like activity -MRI brain with and without contrast completed on my recommendations and reassuring as above -Routine EEG -No indication for antiseizure medications at this time if EEG is reassuring -Alcohol cessation counseling  #Encephalopathy #Daily drinking with evidence of neuropathy -Empiric thiamine 500 mg every 8 hours for 9 doses followed by 100 mg daily -Agree with empiric B12, folate, multivitamin supplementation -CK (resulted reassuring at 40) -Ammonia resulted normal at less than 10 -Appreciate primary team/GI workup and management of her anemia, hyponatremia, liver dysfunction -Agree with holding zolpidem, if it is reinstated would recommend to reduce dose of 5 mg nightly at most, consider tapering entirely -Consider outpatient neurocognitive testing but this would be of limited benefit if patient is continuing to drink due to  confounding  #Remote pontine stroke -Consider aspirin 81 mg daily if hemoglobin level/GI bleeding risk allows  Neurology will follow-up EEG result, if this is reassuring we will sign off but please do not hesitate to reach out if additional questions or concerns arise.  Thank you for involving [place] in the care of this patient  Brooke Dare MD-PhD [facility] [phone] [facility] coverage for [facility] is from 8 AM to 4 AM in-house and 4 PM to 8 PM by telephone/video. 8 PM to 8 AM emergent questions or overnight urgent questions should be addressed to Teleneurology On-call or [facility] neurohospitalist; contact information can be found on AMION

## 2022-12-07 NOTE — Assessment & Plan Note (Signed)
Continue PPI therapy.

## 2022-12-07 NOTE — Assessment & Plan Note (Addendum)
-   Continue Xanax and Cymbalta as well as Abilify.

## 2022-12-07 NOTE — H&P (Addendum)
[place]   PATIENT NAME: Marilyn Haas    MR#:  [id number]  DATE OF BIRTH:  1941/03/12  DATE OF ADMISSION:  12/07/2022  PRIMARY CARE PHYSICIAN: Elfredia Nevins, MD   Patient is coming from: Home  REQUESTING/REFERRING PHYSICIAN:, Dylan, MD  CHIEF COMPLAINT:   Chief Complaint  Patient presents with   Altered Mental Status    Pt is from a hotel. Pts son reports pt has a syncopal episode but did not fall. Pts son lowered pt to the ground. EMS reports hx of ETOH. Pt is unsure of any allergies or medical hx at this time. Pt is alert but not oriented at this time. Pt resp are even and unlabored. Skin is dry and warm and appropriate for ethnicity.     HISTORY OF PRESENT ILLNESS:  Marilyn Haas is a 82 y.o. Caucasian female with medical history significant for anxiety, osteoarthritis, GERD, depression, diverticulosis, chronic back pain, hypertension and osteoporosis, who presented to the emergency room with acute onset of altered mental status with confusion.  She has been less responsive however denied any loss of consciousness.  No fever or chills.  No nausea or vomiting or abdominal pain.  Her last alcoholic drink was a week ago.  She drinks 1-2 beers per day per her report.  No paresthesias or focal muscle weakness.  She denied any dysuria, oliguria or hematuria or flank pain.  Per her son she gets tense and stiff and was mumbling but speaking some.  No chest pain or palpitations.  No cough or wheezing or hemoptysis.  No nausea or vomiting or abdominal pain.  ED Course: When she came to the ER, heart rate was 120 with otherwise normal vital signs.  Labs revealed hyponatremia 121 with hypokalemia and hypochloremia, with blood glucose of 181, BUN of 26 albumin 2.6 with AST 89 ALT 47 and total bili 5.2.  CBC showed leukocytosis 13 with neutrophilia.  Tylenol level was less than 10 and salicylate less than 7. EKG as reviewed by me : EKG showed sinus tachycardia with rate of 106  with borderline left axis deviation. Imaging: Portable chest x-ray showed large hiatal hernia with no acute cardiopulmonary disease.  The patient was given 1 L bolus of IV lactated Ringer's and 40 mg of IV Protonix.  She will be admitted to a medical telemetry bed for further evaluation and management. PAST MEDICAL HISTORY:   Past Medical History:  Diagnosis Date   Anemia    Anxiety    Arthritis    Breast cancer (HCC) 05/19/2016   Right breast   Chronic back pain    Scoliosis, stenosis   Depression    Diverticulosis    GERD (gastroesophageal reflux disease)    Hard of hearing    History of blood transfusion    History of colon polyps    History of hiatal hernia    History of pneumonia    Hypertension    Insomnia    Osteopenia    Osteoporosis    Personal history of radiation therapy    Thickened endometrium 09/29/2017    PAST SURGICAL HISTORY:   Past Surgical History:  Procedure Laterality Date   BIOPSY  02/07/2022   Procedure: BIOPSY;  Surgeon: Dolores Frame, MD;  Location: [facility];  Service: Gastroenterology;;   BREAST LUMPECTOMY WITH RADIOACTIVE SEED AND SENTINEL LYMPH NODE BIOPSY Right 07/31/2016   Procedure: BREAST LUMPECTOMY WITH RADIOACTIVE SEED AND SENTINEL LYMPH NODE BIOPSY, INJECT BLUE DYE RIGHT  BREAST;  Surgeon: Claud Kelp, MD;  Location: [facility] OR;  Service: General;  Laterality: Right;   BREAST LUMPECTOMY WITH RADIOACTIVE SEED LOCALIZATION Left 05/07/2021   Procedure: LEFT BREAST LUMPECTOMY WITH RADIOACTIVE SEED LOCALIZATION;  Surgeon: Harriette Bouillon, MD;  Location: [facility];  Service: General;  Laterality: Left;   COLONOSCOPY     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 07/04/2014   Procedure: LEFT CONVERSION TO TOTAL HIP ARTHROPLASTY;  Surgeon: Ollen Gross, MD;  Location: [facility];  Service: Orthopedics;  Laterality: Left;   ESOPHAGEAL DILATION N/A 09/24/2017   Procedure: ESOPHAGEAL DILATION;  Surgeon: Malissa Hippo, MD;  Location: [facility];  Service: Endoscopy;  Laterality: N/A;   ESOPHAGEAL DILATION N/A 01/19/2020   Procedure: ESOPHAGEAL DILATION;  Surgeon: Dolores Frame, MD;  Location: [facility];  Service: Gastroenterology;  Laterality: N/A;   ESOPHAGEAL DILATION  02/28/2020   Procedure: ESOPHAGEAL DILATION;  Surgeon: Malissa Hippo, MD;  Location: [facility];  Service: Endoscopy;;   ESOPHAGOGASTRODUODENOSCOPY N/A 01/26/2014   Procedure: ESOPHAGOGASTRODUODENOSCOPY (EGD);  Surgeon: Malissa Hippo, MD;  Location: [facility];  Service: Endoscopy;  Laterality: N/A;  1030   ESOPHAGOGASTRODUODENOSCOPY (EGD) WITH PROPOFOL N/A 09/24/2017   Procedure: ESOPHAGOGASTRODUODENOSCOPY (EGD) WITH PROPOFOL;  Surgeon: Malissa Hippo, MD;  Location: [facility];  Service: Endoscopy;  Laterality: N/A;  11:15   ESOPHAGOGASTRODUODENOSCOPY (EGD) WITH PROPOFOL N/A 01/19/2020   Procedure: ESOPHAGOGASTRODUODENOSCOPY (EGD) WITH PROPOFOL;  Surgeon: Dolores Frame, MD;  Location: [facility];  Service: Gastroenterology;  Laterality: N/A;  1200   ESOPHAGOGASTRODUODENOSCOPY (EGD) WITH PROPOFOL N/A 02/28/2020   Procedure: ESOPHAGOGASTRODUODENOSCOPY (EGD) WITH PROPOFOL;  Surgeon: Malissa Hippo, MD;  [facility]: [facility];  Service: Endoscopy;  Laterality: N/A;  2:45   ESOPHAGOGASTRODUODENOSCOPY (EGD) WITH PROPOFOL N/A 02/07/2022   Procedure: ESOPHAGOGASTRODUODENOSCOPY (EGD) WITH PROPOFOL;  Surgeon: Dolores Frame, MD;  Location: [facility];  Service: Gastroenterology;  Laterality: N/A;   HIP FRACTURE SURGERY     05/2009 left -[place]   HYSTEROSCOPY WITH D & C  05/24/2018   Procedure: DILATATION AND CURETTAGE /HYSTEROSCOPY (PROCEDURE # 2)/PAP SMEAR (PROCEDURE #1);  Surgeon: Tilda Burrow, MD;  Location: [facility];  Service: Gynecology;;   [name] DILATION N/A 01/26/2014   Procedure: Alvy Beal;  Surgeon: Malissa Hippo, MD;  Location: [facility];  Service: Endoscopy;  Laterality: N/A;     SOCIAL HISTORY:   Social History   Tobacco Use   Smoking status: Former    Current packs/day: 0.00    Average packs/day: 0.5 packs/day for 2.0 years (1.0 ttl pk-yrs)    Types: Cigarettes    Start date: 01/16/1959    Quit date: 01/15/1961    Years since quitting: 61.9    Passive exposure: Past   Smokeless tobacco: Never   Tobacco comments:    Verified by Silas Flood  Substance Use Topics   Alcohol use: Yes    Alcohol/week: 15.0 standard drinks of alcohol    Types: 15 Cans of beer per week    Comment: 4 "light" beers daily    FAMILY HISTORY:   Family History  Problem Relation Age of Onset   Heart failure Mother    Diabetes Mother    Hypertension Mother    Depression Mother    Other Father        stomach ulcers; abd aneursym   Hypertension Sister    Heart failure Sister    Diabetes Sister  Depression Sister    Diabetes Sister        prediabetes   Cancer Maternal Aunt        breast    DRUG ALLERGIES:   Allergies  Allergen Reactions   Avelox [Moxifloxacin Hcl In Nacl] Other (See Comments)    Altered mental status    REVIEW OF SYSTEMS:   ROS As per history of present illness. All pertinent systems were reviewed above. Constitutional, HEENT, cardiovascular, respiratory, GI, GU, musculoskeletal, neuro, psychiatric, endocrine, integumentary and hematologic systems were reviewed and are otherwise negative/unremarkable except for positive findings mentioned above in the HPI.   MEDICATIONS AT HOME:   Prior to Admission medications   Medication Sig Start Date End Date Taking? Authorizing Provider  acetaminophen (TYLENOL) 325 MG tablet Take 650 mg by mouth every 6 (six) hours as needed.    [provider]  ALPRAZolam Prudy Feeler) 0.25 MG tablet Take 0.25 mg by mouth at bedtime as needed for anxiety.    [provider]  ARIPiprazole (ABILIFY) 2 MG tablet Take 1 tablet (2 mg total) by mouth daily. 02/26/22   Sharee Holster, NP  Cholecalciferol  (VITAMIN D3) 125 MCG (5000 UT) CAPS Take 1 capsule by mouth daily. Patient not taking: Reported on 03/04/2022    [provider]  cyanocobalamin 1000 MCG tablet Take 1 tablet (1,000 mcg total) by mouth daily. 02/12/22   Catarina Hartshorn, MD  DULoxetine (CYMBALTA) 60 MG capsule Take 2 capsules (120 mg total) by mouth daily. 02/26/22   Sharee Holster, NP  ferrous sulfate 325 (65 FE) MG tablet Take 1 tablet (325 mg total) by mouth daily with breakfast. Patient not taking: Reported on 03/04/2022 02/26/22   Sharee Holster, NP  folic acid (FOLVITE) 1 MG tablet Take 1 tablet (1 mg total) by mouth daily. 02/26/22   Sharee Holster, NP  furosemide (LASIX) 20 MG tablet Take 1 tablet (20 mg total) by mouth daily as needed for fluid. Patient not taking: Reported on 03/04/2022 02/26/22   Sharee Holster, NP  loperamide (IMODIUM) 2 MG capsule Take 1 capsule (2 mg total) by mouth 4 (four) times daily as needed for diarrhea or loose stools. 10/26/22   Leath-Warren, Sadie Haber, NP  losartan (COZAAR) 25 MG tablet TAKE ONE TABLET BY MOUTH TWICE A DAY 10/23/22   Jonelle Sidle, MD  pantoprazole (PROTONIX) 40 MG tablet Take 1 tablet (40 mg total) by mouth 2 (two) times daily. 03/04/22   Aida Raider, NP  tamoxifen (NOLVADEX) 10 MG tablet TAKE ONE TABLET (10MG  TOTAL) BY MOUTH EVERY OTHER DAY 07/30/22   Rachel Moulds, MD  tamoxifen (NOLVADEX) 20 MG tablet Take 0.5 tablets (10 mg total) by mouth every other day. 02/26/22   Sharee Holster, NP  thiamine (VITAMIN B-1) 100 MG tablet Take 1 tablet (100 mg total) by mouth daily. Patient not taking: Reported on 03/04/2022 02/12/22   Catarina Hartshorn, MD  zolpidem (AMBIEN) 5 MG tablet Take 1 tablet (5 mg total) by mouth at bedtime. Patient not taking: Reported on 03/04/2022 02/26/22   Sharee Holster, NP      VITAL SIGNS:  Blood pressure (!) 101/56, pulse 95, temperature 97.7 F (36.5 C), temperature source Oral, resp. rate 14, height 5\' 1"  (1.549 m), weight  55.8 kg, SpO2 100%.  PHYSICAL EXAMINATION:  Physical Exam  GENERAL:  82 y.o.-year-old female patient lying in the bed with no acute distress.  She was mildly somnolent but arousable.  She was  mildly confused but follows commands.Marland Kitchen EYES: Pupils equal, round, reactive to light and accommodation. No scleral icterus. Extraocular muscles intact.  HEENT: Head atraumatic, normocephalic. Oropharynx and nasopharynx clear.  NECK:  Supple, no jugular venous distention. No thyroid enlargement, no tenderness.  LUNGS: Normal breath sounds bilaterally, no wheezing, rales,rhonchi or crepitation. No use of accessory muscles of respiration.  CARDIOVASCULAR: Regular rate and rhythm, S1, S2 normal. No murmurs, rubs, or gallops.  ABDOMEN: Soft, nondistended, nontender. Bowel sounds present. No organomegaly or mass.  EXTREMITIES: No pedal edema, cyanosis, or clubbing.  NEUROLOGIC: Cranial nerves II through XII are intact. Muscle strength 5/5 in all extremities. Sensation intact. Gait not checked.  PSYCHIATRIC: The patient is alert and oriented x 3.  Normal affect and good eye contact. SKIN: No obvious rash, lesion, or ulcer.   LABORATORY PANEL:   CBC Recent Labs  Lab 12/07/22 0037  WBC 13.0*  HGB 7.9*  HCT 22.7*  PLT 250   ------------------------------------------------------------------------------------------------------------------  Chemistries  Recent Labs  Lab 12/07/22 0037  NA 121*  K 3.4*  CL 89*  CO2 22  GLUCOSE 181*  BUN 26*  CREATININE 0.81  CALCIUM 8.0*  MG 1.9  AST 89*  ALT 47*  ALKPHOS 79  BILITOT 1.0   ------------------------------------------------------------------------------------------------------------------  Cardiac Enzymes No results for input(s): "TROPONINI" in the last 168 hours. ------------------------------------------------------------------------------------------------------------------  RADIOLOGY:  CT HEAD WO CONTRAST ( )  Result Date:  12/07/2022 CLINICAL DATA:  Altered mental status EXAM: CT HEAD WITHOUT CONTRAST TECHNIQUE: Contiguous axial images were obtained from the base of the skull through the vertex without intravenous contrast. RADIATION DOSE REDUCTION: This exam was performed according to the departmental dose-optimization program which includes automated exposure control, adjustment of the mA and/or kV according to patient size and/or use of iterative reconstruction technique. COMPARISON:  02/06/2022 FINDINGS: Brain: Normal anatomic configuration. Parenchymal volume loss is commensurate with the patient's age. Mild periventricular white matter changes are present likely reflecting the sequela of small vessel ischemia. No abnormal intra or extra-axial mass lesion or fluid collection. No abnormal mass effect or midline shift. No evidence of acute intracranial hemorrhage or infarct. Ventricular size is normal. Cerebellum unremarkable. Vascular: No asymmetric hyperdense vasculature at the skull base. Skull: Intact Sinuses/Orbits: Paranasal sinuses are clear. Orbits are unremarkable. Other: Mastoid air cells and middle ear cavities are clear. IMPRESSION: 1. No acute intracranial hemorrhage or infarct. 2. Mild senescent change. Electronically Signed   By: Helyn Numbers M.D.   On: 12/07/2022 01:24   DG Chest Portable 1 View  Result Date: 12/07/2022 CLINICAL DATA:  Altered mental status EXAM: PORTABLE CHEST 1 VIEW COMPARISON:  02/06/2022 FINDINGS: Lungs are clear. No pneumothorax or pleural effusion. Large hiatal hernia. Cardiac size within normal limits. No acute bone abnormality. IMPRESSION: 1. No active disease. 2. Large hiatal hernia. Electronically Signed   By: Helyn Numbers M.D.   On: 12/07/2022 01:10      IMPRESSION AND PLAN:  Assessment and Plan: * Hyponatremia - This could be contributing to her altered mental status with confusion. - The patient will be admitted to a medical telemetry bed. - Will obtain hyponatremia  workup. - She will be hydrated with IV normal saline. - Will follow BMPs. - We will hold off diuretic therapy.  Hypokalemia - We will replace potassium and check magnesium level.  Alcohol abuse - Per her son she was tense and stiff raising the possibility of seizure that could be alcohol withdrawal. - Will be placed on a banana bag daily and  seizure precautions. - EEG will be obtained. - Neurology consult to be obtained.  Essential hypertension - We will continue antihypertensive therapy.  History of right breast cancer - Continue tamoxifen.  Anxiety and depression - Continue Xanax and Cymbalta as well as Abilify.  GERD without esophagitis - Continue PPI therapy.   DVT prophylaxis: Lovenox.  Advanced Care Planning:  Code Status: full code.  Family Communication:  The plan of care was discussed in details with the patient (and family). I answered all questions. The patient agreed to proceed with the above mentioned plan. Further management will depend upon hospital course. Disposition Plan: Back to previous home environment Consults called: none.  All the records are reviewed and case discussed with ED provider.  Status is: Inpatient  At the time of the admission, it appears that the appropriate admission status for this patient is inpatient.  This is judged to be reasonable and necessary in order to provide the required intensity of service to ensure the patient's safety given the presenting symptoms, physical exam findings and initial radiographic and laboratory data in the context of comorbid conditions.  The patient requires inpatient status due to high intensity of service, high risk of further deterioration and high frequency of surveillance required.  I certify that at the time of admission, it is my clinical judgment that the patient will require inpatient hospital care extending more than 2 midnights.                            Dispo: The patient is from: Home               Anticipated d/c is to: Home              Patient currently is not medically stable to d/c.              Difficult to place patient: No  Hannah Beat M.D on 12/07/2022 at 5:08 AM  [facility]   From 7 PM-7 AM, contact night-coverage [email or web address]  CC: Primary care physician; Elfredia Nevins, MD

## 2022-12-07 NOTE — ED Notes (Signed)
ED Provider at bedside. 

## 2022-12-07 NOTE — ED Notes (Signed)
Changed bed pads, provided pt with pillow and warm blanket. Pt repositioned, side rails up x2,  and call light placed back within reach. No further needs identified at this time.

## 2022-12-07 NOTE — ED Notes (Signed)
Lab at bedside

## 2022-12-07 NOTE — Consult Note (Signed)
Arlyss Repress, MD [address]  [address]  [place], [place] [address]  Main: [phone]  Fax: [phone] Pager: [phone]   Consultation  Referring Provider:     No ref. provider found Primary Care Physician:  Elfredia Nevins, MD Primary Gastroenterologist: [facility]         Reason for Consultation:     Anemia, melena  Date of Admission:  12/07/2022 Date of Consultation:  12/07/2022         HPI:   MYONG RAVAN is a 82 y.o. female with history of large hiatal hernia, chronic GERD, alcohol abuse, depression who presented to ER yesterday from local hotel for presyncope as witnessed by the patient's son.  Patient was found to be altered by her son who is visiting her.  He was concerned about possible seizure.  Patient had tachycardia, normal blood pressure, afebrile in the ER.  She was admitted to [facility] in November 2023 after a fall, anemia.  EGD revealed large hiatal hernia with Sheria Lang ulcers and nonobstructing esophageal stricture.  Admission labs revealed mild leukocytosis 13K, hemoglobin 7.9 dropped from 10.1 since December 2023, normal MCV, normal platelets, undetectable serum alcohol levels.  Elevated LFTs AST/ALT 89/47, AP 79, T. bili 1.0, hyponatremia of 121, elevated BUN/creatinine 26/0.81, normal lipase.  Repeat hemoglobin this morning was 6.1, therefore GI is consulted for further evaluation given history of peptic ulcer disease.  She was supposed to be taking Protonix 40 mg p.o. twice daily as outpatient.  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   NSAIDs: None  Antiplts/Anticoagulants/Anti thrombotics: None  GI Procedures:  Upper endoscopy 02/07/2022 - Benign- appearing  esophageal stenosis. Not dilated - non critical. - Large hiatal hernia with five Cameron ulcers. - Normal stomach. Biopsied. - Normal examined duodenum.  Past Medical History:  Diagnosis Date   Anemia    Anxiety    Arthritis    Breast cancer (HCC) 05/19/2016   Right breast   Chronic back pain    Scoliosis, stenosis   Depression    Diverticulosis    GERD (gastroesophageal reflux disease)    Hard of hearing    History of blood transfusion    History of colon polyps    History of hiatal hernia    History of pneumonia    Hypertension    Insomnia    Osteopenia    Osteoporosis    Personal history of radiation therapy    Thickened endometrium 09/29/2017    Past Surgical History:  Procedure Laterality Date   BIOPSY  02/07/2022   Procedure: BIOPSY;  Surgeon: Dolores Frame, MD;  [facility]: [facility];  Service: Gastroenterology;;   BREAST LUMPECTOMY WITH RADIOACTIVE SEED AND SENTINEL LYMPH NODE BIOPSY Right 07/31/2016   Procedure: BREAST LUMPECTOMY WITH RADIOACTIVE SEED AND SENTINEL LYMPH NODE BIOPSY, INJECT BLUE DYE RIGHT BREAST;  Surgeon: Claud Kelp, MD;  Location: [facility];  Service: General;  Laterality: Right;   BREAST LUMPECTOMY WITH RADIOACTIVE SEED LOCALIZATION Left 05/07/2021   Procedure: LEFT BREAST LUMPECTOMY WITH RADIOACTIVE SEED LOCALIZATION;  Surgeon: Harriette Bouillon, MD;  Location: [facility];  Service: General;  Laterality: Left;   COLONOSCOPY     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 07/04/2014   Procedure: LEFT CONVERSION  TO TOTAL HIP ARTHROPLASTY;  Surgeon: Ollen Gross, MD;  Location: [facility];  Service: Orthopedics;  Laterality: Left;   ESOPHAGEAL DILATION N/A 09/24/2017   Procedure: ESOPHAGEAL DILATION;  Surgeon: Malissa Hippo, MD;  [facility]: [facility];  Service: Endoscopy;  Laterality: N/A;   ESOPHAGEAL DILATION N/A 01/19/2020   Procedure: ESOPHAGEAL DILATION;  Surgeon: Dolores Frame, MD;  [facility];  Service:  Gastroenterology;  Laterality: N/A;   ESOPHAGEAL DILATION  02/28/2020   Procedure: ESOPHAGEAL DILATION;  Surgeon: Malissa Hippo, MD;  [facility];  Service: Endoscopy;;   ESOPHAGOGASTRODUODENOSCOPY N/A 01/26/2014   Procedure: ESOPHAGOGASTRODUODENOSCOPY (EGD);  Surgeon: Malissa Hippo, MD;  [facility];  Service: Endoscopy;  Laterality: N/A;  1030   ESOPHAGOGASTRODUODENOSCOPY (EGD) WITH PROPOFOL N/A 09/24/2017   Procedure: ESOPHAGOGASTRODUODENOSCOPY (EGD) WITH PROPOFOL;  Surgeon: Malissa Hippo, MD;  [facility];  Service: Endoscopy;  Laterality: N/A;  11:15   ESOPHAGOGASTRODUODENOSCOPY (EGD) WITH PROPOFOL N/A 01/19/2020   Procedure: ESOPHAGOGASTRODUODENOSCOPY (EGD) WITH PROPOFOL;  Surgeon: Dolores Frame, MD;  [facility];  Service: Gastroenterology;  Laterality: N/A;  1200   ESOPHAGOGASTRODUODENOSCOPY (EGD) WITH PROPOFOL N/A 02/28/2020   Procedure: ESOPHAGOGASTRODUODENOSCOPY (EGD) WITH PROPOFOL;  Surgeon: Malissa Hippo, MD;  [facility];  Service: Endoscopy;  Laterality: N/A;  2:45   ESOPHAGOGASTRODUODENOSCOPY (EGD) WITH PROPOFOL N/A 02/07/2022   Procedure: ESOPHAGOGASTRODUODENOSCOPY (EGD) WITH PROPOFOL;  Surgeon: Dolores Frame, MD;  [facility];  Service: Gastroenterology;  Laterality: N/A;   HIP FRACTURE SURGERY     05/2009 left -[place]   HYSTEROSCOPY WITH D & C  05/24/2018   Procedure: DILATATION AND CURETTAGE /HYSTEROSCOPY (PROCEDURE # 2)/PAP SMEAR (PROCEDURE #1);  Surgeon: Tilda Burrow, MD;  Location: [facility];  Service: Gynecology;;   [name] DILATION N/A 01/26/2014   Procedure: Alvy Beal;  Surgeon: Malissa Hippo, MD;  [facility];  Service: Endoscopy;  Laterality: N/A;     Current Facility-Administered Medications:    0.9 %  sodium chloride infusion (Manually program via Guardrails IV Fluids), , Intravenous, Once, Wouk, Wilfred Curtis, MD   0.9 % NaCl with KCl 20  mEq/ L  infusion, , Intravenous, Continuous, Mansy, Jan A, MD, Stopped at 12/07/22 1651   acetaminophen (TYLENOL) tablet 650 mg, 650 mg, Oral, Q6H PRN **OR** acetaminophen (TYLENOL) suppository 650 mg, 650 mg, Rectal, Q6H PRN, Mansy, Jan A, MD   ALPRAZolam Prudy Feeler) tablet 0.25 mg, 0.25 mg, Oral, QHS PRN, Mansy, Jan A, MD   ARIPiprazole (ABILIFY) tablet 2 mg, 2 mg, Oral, Daily, Mansy, Jan A, MD, 2 mg at 12/07/22 0848   cyanocobalamin (VITAMIN B12) tablet 1,000 mcg, 1,000 mcg, Oral, Daily, Mansy, Jan A, MD, 1,000 mcg at 12/07/22 0848   [START ON 12/08/2022] DULoxetine (CYMBALTA) DR capsule 60 mg, 60 mg, Oral, Daily, Wouk, Wilfred Curtis, MD   folic acid (FOLVITE) tablet 1 mg, 1 mg, Oral, Daily, Mansy, Jan A, MD, 1 mg at 12/07/22 0844   loperamide (IMODIUM) capsule 2 mg, 2 mg, Oral, QID PRN, Mansy, Jan A, MD   LORazepam (ATIVAN) tablet 1-4 mg, 1-4 mg, Oral, Q1H PRN **OR** LORazepam (ATIVAN) injection 1-4 mg, 1-4 mg, Intravenous, Q1H PRN, Wouk, Wilfred Curtis, MD   magnesium hydroxide (MILK OF MAGNESIA) suspension 30 mL, 30 mL, Oral, Daily PRN, Mansy, Jan A, MD   multivitamin with minerals tablet 1 tablet, 1 tablet, Oral, Daily, Wouk, Wilfred Curtis, MD   ondansetron [facility]) tablet 4  mg, 4 mg, Oral, Q6H PRN **OR** ondansetron (ZOFRAN) injection 4 mg, 4 mg, Intravenous, Q6H PRN, Mansy, Jan A, MD   pantoprazole (PROTONIX) injection 40 mg, 40 mg, Intravenous, Q12H, Wouk, Wilfred Curtis, MD   sodium chloride flush (NS) 0.9 % injection 3 mL, 3 mL, Intravenous, Q12H, Mansy, Jan A, MD, 3 mL at 12/07/22 2725   tamoxifen (NOLVADEX) tablet 10 mg, 10 mg, Oral, QODAY, Mansy, Jan A, MD, 10 mg at 12/07/22 3664   thiamine (VITAMIN B1) 500 mg in sodium chloride 0.9 % 50 mL IVPB, 500 mg, Intravenous, Q8H, Stopped at 12/07/22 1544 **FOLLOWED BY** [START ON 12/10/2022] thiamine (VITAMIN B1) injection 100 mg, 100 mg, Intravenous, Daily, Bhagat, Srishti L, MD   Family History  Problem Relation Age of Onset   Heart failure Mother     Diabetes Mother    Hypertension Mother    Depression Mother    Other Father        stomach ulcers; abd aneursym   Hypertension Sister    Heart failure Sister    Diabetes Sister    Depression Sister    Diabetes Sister        prediabetes   Cancer Maternal Aunt        breast     Social History   Tobacco Use   Smoking status: Former    Current packs/day: 0.00    Average packs/day: 0.5 packs/day for 2.0 years (1.0 ttl pk-yrs)    Types: Cigarettes    Start date: 01/16/1959    Quit date: 01/15/1961    Years since quitting: 61.9    Passive exposure: Past   Smokeless tobacco: Never   Tobacco comments:    Verified by Silas Flood  Vaping Use   Vaping status: Never Used  Substance Use Topics   Alcohol use: Yes    Alcohol/week: 15.0 standard drinks of alcohol    Types: 15 Cans of beer per week    Comment: 4 "light" beers daily   Drug use: No    Allergies as of 12/07/2022 - Review Complete 12/07/2022  Allergen Reaction Noted   Avelox [moxifloxacin hcl in nacl] Other (See Comments) 05/25/2011    Review of Systems:    All systems reviewed and negative except where noted in HPI.   Physical Exam:  Vital signs in last 24 hours: Temp:  [97.4 F (36.3 C)-98 F (36.7 C)] 97.8 F (36.6 C) (09/23 1728) Pulse Rate:  [79-120] 89 (09/23 1728) Resp:  [12-18] 16 (09/23 1728) BP: (84-127)/(51-80) 84/57 (09/23 1728) SpO2:  [98 %-100 %] 98 % (09/23 1728) Weight:  [55.8 kg] 55.8 kg (09/23 0027) Last BM Date : 12/06/22 General:   Pleasant, cooperative in NAD Head:  Normocephalic and atraumatic. Eyes:   No icterus.   Conjunctiva pink. PERRLA. Ears:  Normal auditory acuity. Neck:  Supple; no masses or thyroidomegaly Lungs: Respirations even and unlabored. Lungs clear to auscultation bilaterally.   No wheezes, crackles, or rhonchi.  Heart:  Regular rate and rhythm;  Without murmur, clicks, rubs or gallops Abdomen:  Soft, nondistended, nontender. Normal bowel sounds. No appreciable  masses or hepatomegaly.  No rebound or guarding.  Rectal:  Not performed. Msk:  Symmetrical without gross deformities.  Strength generalized weakness Extremities:  Without edema, cyanosis or clubbing. Neurologic:  Alert and oriented x2;  grossly normal neurologically. Skin:  Intact without significant lesions or rashes. Psych:  Alert and cooperative. Normal affect.  LAB RESULTS:    Latest Ref Rng & Units 12/07/2022  5:34 PM 2022-12-16    8:56 AM 12-16-2022    5:21 AM  CBC  WBC 4.0 - 10.5 K/uL 6.9   11.3   Hemoglobin 12.0 - 15.0 g/dL 8.4  6.1  6.0   Hematocrit 36.0 - 46.0 % 24.1   16.7   Platelets 150 - 400 K/uL 151   170     BMET    Latest Ref Rng & Units 2022/12/16    5:34 PM 12/16/22   11:44 AM 12-16-2022    5:21 AM  BMP  Glucose 70 - 99 mg/dL 161  096  97   BUN 8 - 23 mg/dL 24  24  24    Creatinine 0.44 - 1.00 mg/dL 0.45  4.09  8.11   Sodium 135 - 145 mmol/L 125  123  122   Potassium 3.5 - 5.1 mmol/L 3.5  3.7  3.6   Chloride 98 - 111 mmol/L 97  95  95   CO2 22 - 32 mmol/L 21  22  22    Calcium 8.9 - 10.3 mg/dL 7.6  7.5  7.5     LFT    Latest Ref Rng & Units 2022/12/16   12:37 AM 02/07/2022    4:38 AM 02/06/2022    4:20 PM  Hepatic Function  Total Protein 6.5 - 8.1 g/dL 5.2  5.7  6.1   Albumin 3.5 - 5.0 g/dL 2.6  2.9  3.1   AST 15 - 41 U/L 89  14  15   ALT 0 - 44 U/L 47  8  10   Alk Phosphatase 38 - 126 U/L 79  37  43   Total Bilirubin 0.3 - 1.2 mg/dL 1.0  0.5  0.2   Bilirubin, Direct 0.0 - 0.2 mg/dL   0.1      STUDIES: EEG adult  Result Date: 12-16-22 Charlsie Quest, MD     December 16, 2022  5:13 PM Patient Name: CHRISELLE TOSCH MRN: [id number] Epilepsy Attending: Charlsie Quest Referring Physician/Provider: Hannah Beat, MD Date: December 16, 2022 Duration: 27.31 mins Patient history: 82yo F with ams getting eeg to evaluate for seizure Level of alertness: Awake AEDs during EEG study: None Technical aspects: This EEG study was done with scalp electrodes positioned  according to the 10-20 International system of electrode placement. Electrical activity was reviewed with band pass filter of 1-70Hz , sensitivity of 7 uV/mm, display speed of 55mm/sec with a 60Hz  notched filter applied as appropriate. EEG data were recorded continuously and digitally stored.  Video monitoring was available and reviewed as appropriate. Description: The posterior dominant rhythm consists of 7 Hz activity of moderate voltage (25-35 uV) seen predominantly in posterior head regions, symmetric and reactive to eye opening and eye closing. EEG showed continuous generalized 6 to 7 Hz theta slowing. Hyperventilation and photic stimulation were not performed.   ABNORMALITY - Continuous slow, generalized IMPRESSION: This study is suggestive of mild diffuse encephalopathy. No seizures or epileptiform discharges were seen throughout the recording. Charlsie Quest   MR BRAIN W WO CONTRAST  Result Date: 12-16-2022 CLINICAL DATA:  Delirium.  Seizure-like activity EXAM: MRI HEAD WITHOUT AND WITH CONTRAST TECHNIQUE: Multiplanar, multiecho pulse sequences of the brain and surrounding structures were obtained without and with intravenous contrast. CONTRAST:  5mL GADAVIST GADOBUTROL 1 MMOL/ML IV SOLN COMPARISON:  02/07/2022 FINDINGS: Brain: No acute infarction, hemorrhage, hydrocephalus, extra-axial collection or mass lesion. Chronic small vessel ischemia in the cerebral white matter. 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 Generalized cerebral volume loss  which is fairly mild for age. No abnormal enhancement Vascular: Normal flow voids and vascular enhancements Skull and upper cervical spine: Heterogeneity of the calvarium that is stable and benign appearing by CT. Sinuses/Orbits: Unremarkable IMPRESSION: 1. No acute or reversible finding. 2. 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 No acute or Electronically Signed   By: Tiburcio Pea M.D.   On: 12/07/2022 11:38   CT HEAD  WO CONTRAST ( )  Result Date: 12/07/2022 CLINICAL DATA:  Altered mental status EXAM: CT HEAD WITHOUT CONTRAST TECHNIQUE: Contiguous axial images were obtained from the base of the skull through the vertex without intravenous contrast. RADIATION DOSE REDUCTION: This exam was performed according to the departmental dose-optimization program which includes automated exposure control, adjustment of the mA and/or kV according to patient size and/or use of iterative reconstruction technique. COMPARISON:  02/06/2022 FINDINGS: Brain: Normal anatomic configuration. Parenchymal volume loss is commensurate with the patient's age. Mild periventricular white matter changes are present likely reflecting the sequela of small vessel ischemia. No abnormal intra or extra-axial mass lesion or fluid collection. No abnormal mass effect or midline shift. No evidence of acute intracranial hemorrhage or infarct. Ventricular size is normal. Cerebellum unremarkable. Vascular: No asymmetric hyperdense vasculature at the skull base. Skull: Intact Sinuses/Orbits: Paranasal sinuses are clear. Orbits are unremarkable. Other: Mastoid air cells and middle ear cavities are clear. IMPRESSION: 1. No acute intracranial hemorrhage or infarct. 2. Mild senescent change. Electronically Signed   By: Helyn Numbers M.D.   On: 12/07/2022 01:24   DG Chest Portable 1 View  Result Date: 12/07/2022 CLINICAL DATA:  Altered mental status EXAM: PORTABLE CHEST 1 VIEW COMPARISON:  02/06/2022 FINDINGS: Lungs are clear. No pneumothorax or pleural effusion. Large hiatal hernia. Cardiac size within normal limits. No acute bone abnormality. IMPRESSION: 1. No active disease. 2. Large hiatal hernia. Electronically Signed   By: Helyn Numbers M.D.   On: 12/07/2022 01:10      Impression / Plan:   WERONIKA ERTL is a 82 y.o. female with history of alcohol use, chronic GERD, large hiatal hernia, known history of Cameron ulcers is admitted with acute symptomatic  anemia, presyncope  Acute symptomatic iron deficiency anemia with no evidence of active GI bleed Elevated BUN/creatinine concern for acute upper GI bleed, likely recurrence of bleeding from [facility] ulcers in setting of large hiatal hernia Continue IV Protonix 40 mg twice daily, switch to omeprazole 40 mg p.o. twice daily long-term as outpatient Recommend general surgery evaluation for hiatal hernia repair, this can be pursued as outpatient  Recommend upper endoscopy for further evaluation after correction of hyponatremia, tentative plan to perform upper endoscopy tomorrow Diet as tolerated Monitor CBC closely to maintain hemoglobin above 7 Maintain 2 large-bore Ivs She has severe iron and B12 deficiency, recommend parenteral iron therapy and replete B12   Elevated LFTs Recommend right upper quadrant ultrasound Check acute viral hepatitis panel  I have discussed alternative options, risks & benefits,  which include, but are not limited to, bleeding, infection, perforation,respiratory complication & drug reaction.  The patient agrees with this plan & written consent will be obtained.     Thank you for involving me in the care of this patient.      LOS: 0 days   Lannette Donath, MD  12/07/2022, 9:01 PM    Note: This dictation was prepared with Dragon dictation along with smaller phrase technology. Any transcriptional errors that result from this process are unintentional.

## 2022-12-07 NOTE — ED Notes (Signed)
Requested lab for new inpatient lab orders

## 2022-12-07 NOTE — ED Provider Notes (Signed)
[facility] Provider Note    Event Date/Time   First MD Initiated Contact with Patient 12/07/22 0029     (approximate)   History   Altered Mental Status (Pt is from a hotel. Pts son reports pt has a syncopal episode but did not fall. Pts son lowered pt to the ground. EMS reports hx of ETOH. Pt is unsure of any allergies or medical hx at this time. Pt is alert but not oriented at this time. Pt resp are even and unlabored. Skin is dry and warm and appropriate for ethnicity. )   HPI  [name] H Stough is a 82 y.o. female who presents to the ED for evaluation of Altered Mental Status (Pt is from a hotel. Pts son reports pt has a syncopal episode but did not fall. Pts son lowered pt to the ground. EMS reports hx of ETOH. Pt is unsure of any allergies or medical hx at this time. Pt is alert but not oriented at this time. Pt resp are even and unlabored. Skin is dry and warm and appropriate for ethnicity. )   I reviewed medical DC summary from admission in November.  History of GERD, ethanol abuse and depression.  Admitted after a fall, dizziness, blood loss anemia requiring transfusions.  Large hiatal hernia,  Patient presents to the ED via EMS from a local hotel for evaluation of a possible syncopal episode.  Poor history.  Patient is unable to provide much relevant history.  She reports feeling unwell but cannot specify any particular symptoms.  Majority of history is provided by patient's youngest son when he arrives shortly after she does.  He is visiting from [place] for a funeral.  He arrived around 515p today on 9/22, found her in a local hotel and she seemed altered.  When he returned this evening after funeral visitation, still altered.  He tried to help her get up with a walker and as she was vertical with a walker she "seized up" and he helped her to the ground.  He denies any diffuse shaking, but reports that she "went rigid or tight."  She was speaking some during  this encounter, no apparent loss of bowel or bladder.  Son reports that patient has long history of alcohol abuse but no known other recreational drug use   Physical Exam   Triage Vital Signs: ED Triage Vitals  Encounter Vitals Group     BP 12/07/22 0026 124/78     Systolic BP Percentile --      Diastolic BP Percentile --      Pulse Rate 12/07/22 0026 (!) 120     Resp 12/07/22 0026 18     Temp 12/07/22 0026 97.7 F (36.5 C)     Temp Source 12/07/22 0026 Oral     SpO2 12/07/22 0026 100 %     Weight 12/07/22 0027 123 lb (55.8 kg)     Height 12/07/22 0027 5\' 1"  (1.549 m)     Head Circumference --      Peak Flow --      Pain Score --      Pain Loc --      Pain Education --      Exclude from Growth Chart --     Most recent vital signs: Vitals:   12/07/22 0300 12/07/22 0400  BP: (!) 93/51 (!) 101/56  Pulse: 90 95  Resp: 12 14  Temp:    SpO2: 100% 100%    General: Awake.  Occasional dry heaving without emesis.  Clearly confused.  Is oriented to the year, self, but not situation or location.  Follows simple commands in all 4 without apparent deficit CV:  Good peripheral perfusion.  Resp:  Normal effort.  Abd:  No distention.  Soft MSK:  No deformity noted.  No clear signs of trauma Neuro:  No focal deficits appreciated. Other:     ED Results / Procedures / Treatments   Labs (all labs ordered are listed, but only abnormal results are displayed) Labs Reviewed  URINALYSIS, ROUTINE W REFLEX MICROSCOPIC - Abnormal; Notable for the following components:      Result Value   Color, Urine YELLOW (*)    APPearance HAZY (*)    All other components within normal limits  SALICYLATE LEVEL - Abnormal; Notable for the following components:   Salicylate Lvl <7.0 (*)    All other components within normal limits  ACETAMINOPHEN LEVEL - Abnormal; Notable for the following components:   Acetaminophen (Tylenol), Serum <10 (*)    All other components within normal limits  CBC WITH  DIFFERENTIAL/PLATELET - Abnormal; Notable for the following components:   WBC 13.0 (*)    RBC 2.37 (*)    Hemoglobin 7.9 (*)    HCT 22.7 (*)    Neutro Abs 10.2 (*)    Abs Immature Granulocytes 0.19 (*)    All other components within normal limits  COMPREHENSIVE METABOLIC PANEL - Abnormal; Notable for the following components:   Sodium 121 (*)    Potassium 3.4 (*)    Chloride 89 (*)    Glucose, Bld 181 (*)    BUN 26 (*)    Calcium 8.0 (*)    Total Protein 5.2 (*)    Albumin 2.6 (*)    AST 89 (*)    ALT 47 (*)    All other components within normal limits  PROTIME-INR  APTT  ETHANOL  MAGNESIUM  LIPASE, BLOOD  URINE DRUG SCREEN, QUALITATIVE ([facility] ONLY)  BASIC METABOLIC PANEL  CBC  NA AND K (SODIUM & POTASSIUM), RAND UR  OSMOLALITY, URINE  OSMOLALITY  TSH  TYPE AND SCREEN    EKG Sinus rhythm with a rate of 106 bpm.  Normal axis and intervals.  Tremulous baseline.  No clear signs of acute ischemia.  RADIOLOGY CXR interpreted by me without evidence of acute cardiopulmonary pathology. CT head interpreted by me without evidence of acute intracranial pathology  Official radiology report(s): CT HEAD WO CONTRAST ( )  Result Date: 12/07/2022 CLINICAL DATA:  Altered mental status EXAM: CT HEAD WITHOUT CONTRAST TECHNIQUE: Contiguous axial images were obtained from the base of the skull through the vertex without intravenous contrast. RADIATION DOSE REDUCTION: This exam was performed according to the departmental dose-optimization program which includes automated exposure control, adjustment of the mA and/or kV according to patient size and/or use of iterative reconstruction technique. COMPARISON:  02/06/2022 FINDINGS: Brain: Normal anatomic configuration. Parenchymal volume loss is commensurate with the patient's age. Mild periventricular white matter changes are present likely reflecting the sequela of small vessel ischemia. No abnormal intra or extra-axial mass lesion or fluid  collection. No abnormal mass effect or midline shift. No evidence of acute intracranial hemorrhage or infarct. Ventricular size is normal. Cerebellum unremarkable. Vascular: No asymmetric hyperdense vasculature at the skull base. Skull: Intact Sinuses/Orbits: Paranasal sinuses are clear. Orbits are unremarkable. Other: Mastoid air cells and middle ear cavities are clear. IMPRESSION: 1. No acute intracranial hemorrhage or infarct. 2. Mild senescent change. Electronically Signed  By: Helyn Numbers M.D.   On: 12/07/2022 01:24   DG Chest Portable 1 View  Result Date: 12/07/2022 CLINICAL DATA:  Altered mental status EXAM: PORTABLE CHEST 1 VIEW COMPARISON:  02/06/2022 FINDINGS: Lungs are clear. No pneumothorax or pleural effusion. Large hiatal hernia. Cardiac size within normal limits. No acute bone abnormality. IMPRESSION: 1. No active disease. 2. Large hiatal hernia. Electronically Signed   By: Helyn Numbers M.D.   On: 12/07/2022 01:10    PROCEDURES and INTERVENTIONS:  .1-3 Lead EKG Interpretation  Performed by: Delton Prairie, MD Authorized by: Delton Prairie, MD     Interpretation: abnormal     ECG rate:  106   ECG rate assessment: tachycardic     Rhythm: sinus tachycardia     Ectopy: none     Medications  tamoxifen (NOLVADEX) tablet 10 mg (has no administration in time range)  losartan (COZAAR) tablet 25 mg (has no administration in time range)  ALPRAZolam (XANAX) tablet 0.25 mg (has no administration in time range)  ARIPiprazole (ABILIFY) tablet 2 mg (has no administration in time range)  DULoxetine (CYMBALTA) DR capsule 120 mg (has no administration in time range)  loperamide (IMODIUM) capsule 2 mg (has no administration in time range)  pantoprazole (PROTONIX) EC tablet 40 mg (has no administration in time range)  cyanocobalamin (VITAMIN B12) tablet 1,000 mcg (has no administration in time range)  folic acid (FOLVITE) tablet 1 mg (has no administration in time range)  sodium chloride  flush (NS) 0.9 % injection 3 mL (3 mLs Intravenous Given 12/07/22 0336)  enoxaparin (LOVENOX) injection 40 mg (has no administration in time range)  0.9 % NaCl with KCl 20 mEq/ L  infusion ( Intravenous New Bag/Given 12/07/22 0413)  acetaminophen (TYLENOL) tablet 650 mg (has no administration in time range)    Or  acetaminophen (TYLENOL) suppository 650 mg (has no administration in time range)  traZODone (DESYREL) tablet 25 mg (25 mg Oral Given 12/07/22 0342)  magnesium hydroxide (MILK OF MAGNESIA) suspension 30 mL (has no administration in time range)  ondansetron (ZOFRAN) tablet 4 mg (has no administration in time range)    Or  ondansetron (ZOFRAN) injection 4 mg (has no administration in time range)  LORazepam (ATIVAN) injection 1 mg (has no administration in time range)  multivitamin with minerals tablet 1 tablet (has no administration in time range)  lactated ringers bolus 1,000 mL (0 mLs Intravenous Stopped 12/07/22 0410)  pantoprazole (PROTONIX) injection 40 mg (40 mg Intravenous Given 12/07/22 0109)  thiamine (VITAMIN B1) 500 mg in sodium chloride 0.9 % 50 mL IVPB (0 mg Intravenous Stopped 12/07/22 0405)     IMPRESSION / MDM / ASSESSMENT AND PLAN / ED COURSE  I reviewed the triage vital signs and the nursing notes.  Differential diagnosis includes, but is not limited to, stroke, seizure or postictal state, sepsis, cystitis or pneumonia, electrolyte derangement, Warnicke's or Korsakoff  {Patient presents with symptoms of an acute illness or injury that is potentially life-threatening.  Patient presents with nonfocal encephalopathy of uncertain chronicity, with evidence of hyponatremia and requiring medical admission.  No symptoms of GI bleeding, hemoglobin is slightly lower than comparison but no indications for transfusions.  Multiple electrolyte derangements, primarily hyponatremia and we initiate replacement IV.  Imaging is benign, no signs of cystitis or clear infectious etiology for  symptoms.  Consult with medicine for admission  Clinical Course as of 12/07/22 0515  Mon Dec 07, 2022  0111 Reassessed as patient was heading to CT.  Her youngest son is at the bedside, he is visiting from [place] for the funeral.  Arrived around 5:15 PM on Sunday and patient was at a motel and seemed altered, distant.  He returned this evening after funeral visitation and she was similarly altered.  Had an episode of "seizing up" that he helped her to the ground. [DS]  0231 Consult with medicine who agrees to admit [DS]    Clinical Course User Index [DS] Delton Prairie, MD     FINAL CLINICAL IMPRESSION(S) / ED DIAGNOSES   Final diagnoses:  Confusion  Hyponatremia  Normocytic anemia     Rx / DC Orders   ED Discharge Orders     None        Note:  This document was prepared using Dragon voice recognition software and may include unintentional dictation errors.   Delton Prairie, MD 12/07/22 [phone]

## 2022-12-07 NOTE — ED Notes (Signed)
PT son Marilyn Haas going home. Would like to be updated if any changes occur.

## 2022-12-07 NOTE — ED Notes (Addendum)
DIL Sheldon Silvan called and updated on patient status after obtaining verbal permission from pt. She requested her number be added as an additional POC instead of her husband because he is working and unable to answer his phone. Her number is [phone]

## 2022-12-07 NOTE — Progress Notes (Addendum)
PROGRESS NOTE    [name]  ZOX:[id number] DOB: July 06, 1940 DOA: 12/07/2022 PCP: Elfredia Nevins, MD     Brief Narrative:   Marilyn Haas is a 82 y.o. Caucasian female with medical history significant for anxiety, osteoarthritis, GERD, depression, diverticulosis, chronic back pain, hypertension and osteoporosis, who presented to the emergency room with acute onset of altered mental status with confusion.  She has been less responsive however denied any loss of consciousness.  No fever or chills.  No nausea or vomiting or abdominal pain.  Her last alcoholic drink was a week ago.  She drinks 1-2 beers per day per her report.  No paresthesias or focal muscle weakness.  She denied any dysuria, oliguria or hematuria or flank pain.  Per her son she gets tense and stiff and was mumbling but speaking some.  No chest pain or palpitations.  No cough or wheezing or hemoptysis.  No nausea or vomiting or abdominal pain.    Assessment & Plan:   Principal Problem:   Hyponatremia Active Problems:   Hypokalemia   Alcohol abuse   Essential hypertension   Malignant neoplasm of upper-outer quadrant of right breast in female, estrogen receptor positive (HCC)   GERD without esophagitis   Anxiety and depression   History of right breast cancer   Confusion  # Acute encephalopathy Hyponatremia, anemia most likely contributors currently. Etoh may also contribute, as could home xanax  - EEG pending - MRI pending - receiving high-dose thiamine - f/u blood cultures  # Hyponatremia, hypoosmolar With low sodium and elevated urine osm suggestive of hypovolemia though also on meds that can cause siadh. Tsh wnl - continue fluids and trend, further w/u if fails to respond to fluids - decrease duloxetine from 120 to 60  # Anemia # History upper GI bleed No report of melena or hematochezia. Hospitalized 02/2022 with upper gi bleed, egd with Sheria Lang ulcers. Hgb 7.9 on arrival, 6.0 on repeat. No  bleeding here - start BID ppi - repeat hgb, if really has dropped to less than 7 will transfuse and consult gi - start clear liquid diet  # Alcohol abuse # Seizure? Per admitter's hpi, symptoms consistent with possible seizure - EEG ordered - MRI pending - neuro to see - ciwa protocol  # History breast cancer - cont home tamoxifen  # GAD -cont home xanax, abilify, duloxetine, will decrease duloxetine dose from 120 to 60  # HTN Bp mild elevation - home losartan      DVT prophylaxis: SCDs Code Status: full Family Communication: each of four children telephoned, no one answered  Level of care: Telemetry Medical Status is: Inpatient Remains inpatient appropriate because: severity of illness    Consultants:  neuro  Procedures: pending  Antimicrobials:  none    Subjective: No complaints  Objective: Vitals:   12/07/22 0500 12/07/22 0600 12/07/22 0657 12/07/22 0800  BP: (!) 98/54 (!) 105/51  (!) 102/56  Pulse: 91 93 91 88  Resp: 15 12 16 15   Temp:      TempSrc:      SpO2: 100% 100% 100% 100%  Weight:      Height:        Intake/Output Summary (Last 24 hours) at 12/07/2022 0832 Last data filed at 12/07/2022 0410 Gross per 24 hour  Intake 1050 ml  Output --  Net 1050 ml   Filed Weights   12/07/22 0027  Weight: 55.8 kg    Examination:  General exam: Appears calm and comfortable  Respiratory system: Clear to auscultation. Respiratory effort normal. Cardiovascular system: S1 & S2 heard, RRR. Soft systolic murmur Gastrointestinal system: Abdomen is nondistended, soft and nontender. No organomegaly or masses felt. Normal bowel sounds heard. Central nervous system: Alert and oriented to self, moving all 4, symmetric, no facial droop Extremities: Symmetric 5 x 5 power. Skin: No rashes, lesions or ulcers Psychiatry: pleasantly confused    Data Reviewed: I have personally reviewed following labs and imaging studies  CBC: Recent Labs  Lab  12/07/22 0037 12/07/22 0521  WBC 13.0* 11.3*  NEUTROABS 10.2*  --   HGB 7.9* 6.0*  HCT 22.7* 16.7*  MCV 95.8 94.4  PLT 250 170   Basic Metabolic Panel: Recent Labs  Lab 12/07/22 0037 12/07/22 0521  NA 121* 122*  K 3.4* 3.6  CL 89* 95*  CO2 22 22  GLUCOSE 181* 97  BUN 26* 24*  CREATININE 0.81 0.64  CALCIUM 8.0* 7.5*  MG 1.9  --    GFR: Estimated Creatinine Clearance: 40.2 mL/min (by C-G formula based on SCr of 0.64 mg/dL). Liver Function Tests: Recent Labs  Lab 12/07/22 0037  AST 89*  ALT 47*  ALKPHOS 79  BILITOT 1.0  PROT 5.2*  ALBUMIN 2.6*   Recent Labs  Lab 12/07/22 0037  LIPASE 26   No results for input(s): "AMMONIA" in the last 168 hours. Coagulation Profile: Recent Labs  Lab 12/07/22 0037  INR 1.1   Cardiac Enzymes: No results for input(s): "CKTOTAL", "CKMB", "CKMBINDEX", "TROPONINI" in the last 168 hours. BNP (last 3 results) No results for input(s): "PROBNP" in the last 8760 hours. HbA1C: No results for input(s): "HGBA1C" in the last 72 hours. CBG: Recent Labs  Lab 12/07/22 0518  GLUCAP 97   Lipid Profile: No results for input(s): "CHOL", "HDL", "LDLCALC", "TRIG", "CHOLHDL", "LDLDIRECT" in the last 72 hours. Thyroid Function Tests: Recent Labs    12/07/22 0037  TSH 3.054   Anemia Panel: No results for input(s): "VITAMINB12", "FOLATE", "FERRITIN", "TIBC", "IRON", "RETICCTPCT" in the last 72 hours. Urine analysis:    Component Value Date/Time   COLORURINE YELLOW (A) 12/06/2022 0037   APPEARANCEUR HAZY (A) 12/06/2022 0037   LABSPEC 1.019 12/06/2022 0037   PHURINE 5.0 12/06/2022 0037   GLUCOSEU NEGATIVE 12/06/2022 0037   HGBUR NEGATIVE 12/06/2022 0037   BILIRUBINUR NEGATIVE 12/06/2022 0037   BILIRUBINUR negative 10/26/2022 0841   KETONESUR NEGATIVE 12/06/2022 0037   PROTEINUR NEGATIVE 12/06/2022 0037   UROBILINOGEN 0.2 10/26/2022 0841   UROBILINOGEN 1.0 06/26/2014 1400   NITRITE NEGATIVE 12/06/2022 0037   LEUKOCYTESUR NEGATIVE  12/06/2022 0037   Sepsis Labs: @LABRCNTIP (procalcitonin:4,lacticidven:4)  )No results found for this or any previous visit (from the past 240 hour(s)).       Radiology Studies: CT HEAD WO CONTRAST ( )  Result Date: 12/07/2022 CLINICAL DATA:  Altered mental status EXAM: CT HEAD WITHOUT CONTRAST TECHNIQUE: Contiguous axial images were obtained from the base of the skull through the vertex without intravenous contrast. RADIATION DOSE REDUCTION: This exam was performed according to the departmental dose-optimization program which includes automated exposure control, adjustment of the mA and/or kV according to patient size and/or use of iterative reconstruction technique. COMPARISON:  02/06/2022 FINDINGS: Brain: Normal anatomic configuration. Parenchymal volume loss is commensurate with the patient's age. Mild periventricular white matter changes are present likely reflecting the sequela of small vessel ischemia. No abnormal intra or extra-axial mass lesion or fluid collection. No abnormal mass effect or midline shift. No evidence of acute intracranial hemorrhage or infarct.  Ventricular size is normal. Cerebellum unremarkable. Vascular: No asymmetric hyperdense vasculature at the skull base. Skull: Intact Sinuses/Orbits: Paranasal sinuses are clear. Orbits are unremarkable. Other: Mastoid air cells and middle ear cavities are clear. IMPRESSION: 1. No acute intracranial hemorrhage or infarct. 2. Mild senescent change. Electronically Signed   By: Helyn Numbers M.D.   On: 12/07/2022 01:24   DG Chest Portable 1 View  Result Date: 12/07/2022 CLINICAL DATA:  Altered mental status EXAM: PORTABLE CHEST 1 VIEW COMPARISON:  02/06/2022 FINDINGS: Lungs are clear. No pneumothorax or pleural effusion. Large hiatal hernia. Cardiac size within normal limits. No acute bone abnormality. IMPRESSION: 1. No active disease. 2. Large hiatal hernia. Electronically Signed   By: Helyn Numbers M.D.   On: 12/07/2022 01:10         Scheduled Meds:  ARIPiprazole  2 mg Oral Daily   cyanocobalamin  1,000 mcg Oral Daily   DULoxetine  120 mg Oral Daily   enoxaparin (LOVENOX) injection  40 mg Subcutaneous Q24H   folic acid  1 mg Oral Daily   losartan  25 mg Oral BID   multivitamin with minerals  1 tablet Oral Once   pantoprazole  40 mg Oral BID   sodium chloride flush  3 mL Intravenous Q12H   tamoxifen  10 mg Oral QODAY   [START ON 12/10/2022] thiamine (VITAMIN B1) injection  100 mg Intravenous Daily   Continuous Infusions:  0.9 % NaCl with KCl 20 mEq / L 100 mL/hr at 12/07/22 0413   thiamine (VITAMIN B1) injection       LOS: 0 days     Silvano Bilis, MD [facility]   If 7PM-7AM, please contact night-coverage [email or web address] Password [facility] 12/07/2022, 8:32 AM

## 2022-12-08 ENCOUNTER — Encounter
Admission: EM | Disposition: A | Discharge status: Skilled Nursing Facility | Payer: Self-pay | Source: Home / Self Care | Attending: Obstetrics and Gynecology

## 2022-12-08 ENCOUNTER — Inpatient Hospital Stay: Payer: Medicare Other | Admitting: Certified Registered"

## 2022-12-08 ENCOUNTER — Ambulatory Visit: Payer: Medicare Other | Admitting: Medical

## 2022-12-08 ENCOUNTER — Telehealth: Payer: Self-pay | Admitting: Cardiology

## 2022-12-08 ENCOUNTER — Inpatient Hospital Stay: Payer: Medicare Other

## 2022-12-08 DIAGNOSIS — K3189 Other diseases of stomach and duodenum: Secondary | ICD-10-CM

## 2022-12-08 DIAGNOSIS — K264 Chronic or unspecified duodenal ulcer with hemorrhage: Secondary | ICD-10-CM

## 2022-12-08 DIAGNOSIS — K269 Duodenal ulcer, unspecified as acute or chronic, without hemorrhage or perforation: Secondary | ICD-10-CM

## 2022-12-08 DIAGNOSIS — E871 Hypo-osmolality and hyponatremia: Secondary | ICD-10-CM | POA: Diagnosis not present

## 2022-12-08 DIAGNOSIS — D509 Iron deficiency anemia, unspecified: Secondary | ICD-10-CM | POA: Diagnosis not present

## 2022-12-08 DIAGNOSIS — K922 Gastrointestinal hemorrhage, unspecified: Secondary | ICD-10-CM

## 2022-12-08 HISTORY — PX: HEMOSTASIS CLIP PLACEMENT: SHX6857

## 2022-12-08 HISTORY — PX: HEMOSTASIS CONTROL: SHX6838

## 2022-12-08 HISTORY — PX: ESOPHAGOGASTRODUODENOSCOPY (EGD) WITH PROPOFOL: SHX5813

## 2022-12-08 LAB — CORTISOL-AM, BLOOD: Cortisol - AM: 12 ug/dL (ref 6.7–22.6)

## 2022-12-08 LAB — HEPATITIS PANEL, ACUTE
HCV Ab: NONREACTIVE
Hep A IgM: NONREACTIVE
Hep B C IgM: NONREACTIVE
Hepatitis B Surface Ag: NONREACTIVE

## 2022-12-08 LAB — CBC
HCT: 25.5 % — ABNORMAL LOW (ref 36.0–46.0)
Hemoglobin: 9.1 g/dL — ABNORMAL LOW (ref 12.0–15.0)
MCH: 32.2 pg (ref 26.0–34.0)
MCHC: 35.7 g/dL (ref 30.0–36.0)
MCV: 90.1 fL (ref 80.0–100.0)
Platelets: 144 10*3/uL — ABNORMAL LOW (ref 150–400)
RBC: 2.83 MIL/uL — ABNORMAL LOW (ref 3.87–5.11)
RDW: 16.3 % — ABNORMAL HIGH (ref 11.5–15.5)
WBC: 6.6 10*3/uL (ref 4.0–10.5)
nRBC: 0 % (ref 0.0–0.2)

## 2022-12-08 LAB — BASIC METABOLIC PANEL
Anion gap: 5 (ref 5–15)
BUN: 23 mg/dL (ref 8–23)
CO2: 19 mmol/L — ABNORMAL LOW (ref 22–32)
Calcium: 7.4 mg/dL — ABNORMAL LOW (ref 8.9–10.3)
Chloride: 104 mmol/L (ref 98–111)
Creatinine, Ser: 0.7 mg/dL (ref 0.44–1.00)
GFR, Estimated: >60 mL/min (ref >60)
Glucose, Bld: 90 mg/dL (ref 70–99)
Potassium: 4.2 mmol/L (ref 3.5–5.1)
Sodium: 128 mmol/L — ABNORMAL LOW (ref 135–145)

## 2022-12-08 LAB — ECHOCARDIOGRAM COMPLETE
AR max vel: 2.82 cm2
AV Area VTI: 2.57 cm2
AV Area mean vel: 2.31 cm2
AV Mean grad: 3.8 mmHg
AV Peak grad: 5.1 mmHg
Ao pk vel: 1.13 m/s
Area-P 1/2: 3.37 cm2
Est EF: >55
Height: 61 in
S' Lateral: 2 cm
Weight: 1968 oz

## 2022-12-08 LAB — PREPARE RBC (CROSSMATCH)

## 2022-12-08 LAB — HEMOGLOBIN AND HEMATOCRIT, BLOOD
HCT: 27.1 % — ABNORMAL LOW (ref 36.0–46.0)
Hemoglobin: 9.4 g/dL — ABNORMAL LOW (ref 12.0–15.0)

## 2022-12-08 LAB — GLUCOSE, CAPILLARY: Glucose-Capillary: 84 mg/dL (ref 70–99)

## 2022-12-08 SURGERY — ESOPHAGOGASTRODUODENOSCOPY (EGD) WITH PROPOFOL
Anesthesia: General

## 2022-12-08 MED ORDER — EPINEPHRINE 1 MG/10ML IJ SOSY
PREFILLED_SYRINGE | INTRAMUSCULAR | Status: AC
  Filled 2022-12-08: qty 10

## 2022-12-08 MED ORDER — SODIUM CHLORIDE 0.9% IV SOLUTION: Freq: Once | INTRAVENOUS | Status: DC

## 2022-12-08 MED ORDER — MEDIHONEY WOUND/BURN DRESSING EX PSTE
1.0000 | PASTE | Freq: Every day | CUTANEOUS | Status: DC
  Administered 2022-12-08 – 2022-12-11 (×4): 1 via TOPICAL
  Filled 2022-12-08: qty 44

## 2022-12-08 MED ORDER — HYDROCERIN EX CREA
TOPICAL_CREAM | Freq: Two times a day (BID) | CUTANEOUS | Status: DC
  Filled 2022-12-08: qty 113

## 2022-12-08 MED ORDER — COSYNTROPIN 0.25 MG IJ SOLR
0.2500 mg | Freq: Once | INTRAMUSCULAR | Status: AC
  Administered 2022-12-09: 0.25 mg via INTRAVENOUS
  Filled 2022-12-08 (×2): qty 0.25

## 2022-12-08 MED ORDER — MIDODRINE HCL 5 MG PO TABS
10.0000 mg | ORAL_TABLET | Freq: Three times a day (TID) | ORAL | Status: DC
  Administered 2022-12-08 – 2022-12-09 (×5): 10 mg via ORAL
  Filled 2022-12-08 (×5): qty 2

## 2022-12-08 MED ORDER — ESMOLOL HCL 100 MG/10ML IV SOLN
INTRAVENOUS | Status: DC | PRN
  Administered 2022-12-08: 30 mg via INTRAVENOUS

## 2022-12-08 MED ORDER — ONDANSETRON HCL 4 MG/2ML IJ SOLN
INTRAMUSCULAR | Status: DC | PRN
  Administered 2022-12-08: 4 mg via INTRAVENOUS

## 2022-12-08 MED ORDER — PANTOPRAZOLE 80MG IVPB - SIMPLE MED
80.0000 mg | Freq: Once | INTRAVENOUS | Status: AC
  Administered 2022-12-08: 80 mg via INTRAVENOUS
  Filled 2022-12-08: qty 100

## 2022-12-08 MED ORDER — SODIUM CHLORIDE 0.9 % IV SOLN: INTRAVENOUS | Status: DC

## 2022-12-08 MED ORDER — PANTOPRAZOLE SODIUM 40 MG IV SOLR: 40.0000 mg | Freq: Two times a day (BID) | INTRAVENOUS | Status: DC

## 2022-12-08 MED ORDER — SODIUM CHLORIDE 0.9 % IV BOLUS
1000.0000 mL | Freq: Once | INTRAVENOUS | Status: AC
  Administered 2022-12-08: 1000 mL via INTRAVENOUS

## 2022-12-08 MED ORDER — SODIUM CHLORIDE 0.9 % IV SOLN: INTRAVENOUS | Status: DC | PRN

## 2022-12-08 MED ORDER — EPINEPHRINE 1 MG/10ML IJ SOSY
PREFILLED_SYRINGE | INTRAMUSCULAR | Status: DC | PRN
Start: 2022-12-08 — End: 2022-12-08
  Administered 2022-12-08: .9 mg

## 2022-12-08 MED ORDER — PANTOPRAZOLE INFUSION (NEW) - SIMPLE MED
8.0000 mg/h | INTRAVENOUS | Status: DC
  Administered 2022-12-08 – 2022-12-10 (×4): 8 mg/h via INTRAVENOUS
  Filled 2022-12-08 (×4): qty 100

## 2022-12-08 MED ORDER — LIDOCAINE HCL (CARDIAC) PF 100 MG/5ML IV SOSY
PREFILLED_SYRINGE | INTRAVENOUS | Status: DC | PRN
  Administered 2022-12-08: 40 mg via INTRAVENOUS

## 2022-12-08 MED ORDER — PROPOFOL 10 MG/ML IV BOLUS
INTRAVENOUS | Status: AC
  Filled 2022-12-08: qty 60

## 2022-12-08 MED ORDER — GLYCOPYRROLATE 0.2 MG/ML IJ SOLN
INTRAMUSCULAR | Status: DC | PRN
  Administered 2022-12-08: .1 mg via INTRAVENOUS

## 2022-12-08 MED ORDER — GERHARDT'S BUTT CREAM
TOPICAL_CREAM | Freq: Three times a day (TID) | CUTANEOUS | Status: DC
  Filled 2022-12-08: qty 1

## 2022-12-08 MED ORDER — PROPOFOL 500 MG/50ML IV EMUL
INTRAVENOUS | Status: DC | PRN
  Administered 2022-12-08: 40 mg via INTRAVENOUS
  Administered 2022-12-08: 150 ug/kg/min via INTRAVENOUS

## 2022-12-08 NOTE — Progress Notes (Addendum)
PROGRESS NOTE    Marilyn Haas  DVV:[id number] DOB: [birth year] DOA: 12/07/2022 PCP: Elfredia Nevins, MD     Brief Narrative:   Marilyn Haas is a 82 y.o. Caucasian female with medical history significant for anxiety, osteoarthritis, GERD, depression, diverticulosis, chronic back pain, hypertension and osteoporosis, who presented to the emergency room with acute onset of altered mental status with confusion.  She has been less responsive however denied any loss of consciousness.  No fever or chills.  No nausea or vomiting or abdominal pain.  Her last alcoholic drink was a week ago.  She drinks 1-2 beers per day per her report.  No paresthesias or focal muscle weakness.  She denied any dysuria, oliguria or hematuria or flank pain.  Per her son she gets tense and stiff and was mumbling but speaking some.  No chest pain or palpitations.  No cough or wheezing or hemoptysis.  No nausea or vomiting or abdominal pain.    Assessment & Plan:   Principal Problem:   Hyponatremia Active Problems:   Hypokalemia   Alcohol abuse   Essential hypertension   Acute on chronic blood loss anemia   Malignant neoplasm of upper-outer quadrant of right breast in female, estrogen receptor positive (HCC)   GERD without esophagitis   Anxiety and depression   History of right breast cancer   Confusion   Pressure injury of skin  # Acute encephalopathy Hyponatremia, anemia most likely contributors currently. Etoh may also contribute, as could home xanax. EEG negative, MRI nothing acute. Neurology consulted, has signed off. Mental status improving. - continue high-dose thiamine - f/u blood cultures, ngtd  # Hyponatremia, hypoosmolar With low sodium and elevated urine osm suggestive of hypovolemia though also on meds that can cause siadh. Tsh wnl. Improving with fluids, 121>128 - continue fluids and trend  - decreased duloxetine from 120 to 60 - cortisol is borderline at 12, will check acth stim test  tomorrow  # Anemia # History upper GI bleed No report of melena or hematochezia. Hospitalized 02/2022 with upper gi bleed, egd with Sheria Lang ulcers. Hgb 7.9 on arrival, 6.0 on repeat. No overt bleeding here. Has received 2 units thus far including one last night after hgb dropped to 7s from 8s, this morning appropriate rise fo 9.1. No report of vomiting or melena/hematochezia overnight or today. - continue IV bid PPI - for endoscopy today  # Debility - PT consulted  # Hiatal hernia - outpt gen surg f/u  # Alcohol abuse No current signs withdrawal - ciwa protocol  # History breast cancer - cont home tamoxifen  # GAD -cont home xanax, abilify, duloxetine, have decreased duloxetine dose from 120 to 60  # HTN Hypotensive overnight started on midodrine - home losartan on hold - wean midodrine as able     DVT prophylaxis: SCDs Code Status: full Family Communication: son billy updated telephonically, he is main contact  Level of care: Telemetry Medical Status is: Inpatient Remains inpatient appropriate because: severity of illness    Consultants:  Neuro, GI  Procedures: pending  Antimicrobials:  none    Subjective: No complaints  Objective: Vitals:   12/08/22 0013 12/08/22 0121 12/08/22 0151 12/08/22 0435  BP: (!) 86/68 (!) 87/56 100/60 112/66  Pulse: 96 79 80 80  Resp:  14 16   Temp:  (!) 97.5 F (36.4 C) 97.6 F (36.4 C) (!) 97.4 F (36.3 C)  TempSrc: Oral Oral Oral Oral  SpO2: 95% 99% 97% 98%  Weight:  Height:        Intake/Output Summary (Last 24 hours) at 12/08/2022 1154 Last data filed at 12/08/2022 0929 Gross per 24 hour  Intake 3170.34 ml  Output --  Net 3170.34 ml   Filed Weights   12/07/22 0027  Weight: 55.8 kg    Examination:  General exam: Appears calm and comfortable  Respiratory system: Clear to auscultation. Respiratory effort normal. Cardiovascular system: S1 & S2 heard, RRR. Soft systolic murmur Gastrointestinal  system: Abdomen is nondistended, soft and nontender. No organomegaly or masses felt. Normal bowel sounds heard. Central nervous system: Alert and oriented to self, moving all 4, symmetric, no facial droop Extremities: Symmetric 5 x 5 power. Skin: No rashes, lesions or ulcers Psychiatry: pleasantly confused    Data Reviewed: I have personally reviewed following labs and imaging studies  CBC: Recent Labs  Lab 12/07/22 0037 12/07/22 0521 12/07/22 0856 12/07/22 1734 12/07/22 2244 12/08/22 0605  WBC 13.0* 11.3*  --  6.9  --  6.6  NEUTROABS 10.2*  --   --   --   --   --   HGB 7.9* 6.0* 6.1* 8.4* 7.3* 9.1*  HCT 22.7* 16.7*  --  24.1* 21.0* 25.5*  MCV 95.8 94.4  --  89.3  --  90.1  PLT 250 170  --  151  --  144*   Basic Metabolic Panel: Recent Labs  Lab 12/07/22 0037 12/07/22 0521 12/07/22 1144 12/07/22 1734 12/08/22 0605  NA 121* 122* 123* 125* 128*  K 3.4* 3.6 3.7 3.5 4.2  CL 89* 95* 95* 97* 104  CO2 22 22 22  21* 19*  GLUCOSE 181* 97 101* 103* 90  BUN 26* 24* 24* 24* 23  CREATININE 0.81 0.64 0.66 0.74 0.70  CALCIUM 8.0* 7.5* 7.5* 7.6* 7.4*  MG 1.9  --   --   --   --    GFR: Estimated Creatinine Clearance: 40.2 mL/min (by C-G formula based on SCr of 0.7 mg/dL). Liver Function Tests: Recent Labs  Lab 12/07/22 0037  AST 89*  ALT 47*  ALKPHOS 79  BILITOT 1.0  PROT 5.2*  ALBUMIN 2.6*   Recent Labs  Lab 12/07/22 0037  LIPASE 26   Recent Labs  Lab 12/07/22 0620  AMMONIA <10   Coagulation Profile: Recent Labs  Lab 12/07/22 0037  INR 1.1   Cardiac Enzymes: Recent Labs  Lab 12/07/22 0037  CKTOTAL 40   BNP (last 3 results) No results for input(s): "PROBNP" in the last 8760 hours. HbA1C: No results for input(s): "HGBA1C" in the last 72 hours. CBG: Recent Labs  Lab 12/07/22 0518 12/08/22 0555  GLUCAP 97 84   Lipid Profile: No results for input(s): "CHOL", "HDL", "LDLCALC", "TRIG", "CHOLHDL", "LDLDIRECT" in the last 72 hours. Thyroid Function  Tests: Recent Labs    12/07/22 0037  TSH 3.054   Anemia Panel: No results for input(s): "VITAMINB12", "FOLATE", "FERRITIN", "TIBC", "IRON", "RETICCTPCT" in the last 72 hours. Urine analysis:    Component Value Date/Time   COLORURINE YELLOW (A) 12/06/2022 0037   APPEARANCEUR HAZY (A) 12/06/2022 0037   LABSPEC 1.019 12/06/2022 0037   PHURINE 5.0 12/06/2022 0037   GLUCOSEU NEGATIVE 12/06/2022 0037   HGBUR NEGATIVE 12/06/2022 0037   BILIRUBINUR NEGATIVE 12/06/2022 0037   BILIRUBINUR negative 10/26/2022 0841   KETONESUR NEGATIVE 12/06/2022 0037   PROTEINUR NEGATIVE 12/06/2022 0037   UROBILINOGEN 0.2 10/26/2022 0841   UROBILINOGEN 1.0 06/26/2014 1400   NITRITE NEGATIVE 12/06/2022 0037   LEUKOCYTESUR NEGATIVE 12/06/2022 0037  Sepsis Labs: @LABRCNTIP (procalcitonin:4,lacticidven:4)  ) Recent Results (from the past 240 hour(s))  Culture, blood (Routine X 2) w Reflex to ID Panel     Status: None (Preliminary result)   Collection Time: 12/07/22  9:20 AM   Specimen: BLOOD LEFT HAND  Result Value Ref Range Status   Specimen Description BLOOD LEFT HAND  Final   Special Requests   Final    BOTTLES DRAWN AEROBIC AND ANAEROBIC Blood Culture adequate volume   Culture   Final    NO GROWTH 1 DAY Performed at [facility], [address], [place], [place] [address]    Report Status PENDING  Incomplete  Culture, blood (Routine X 2) w Reflex to ID Panel     Status: None (Preliminary result)   Collection Time: 12/07/22  9:29 AM   Specimen: BLOOD LEFT ARM  Result Value Ref Range Status   Specimen Description BLOOD LEFT ARM  Final   Special Requests   Final    BOTTLES DRAWN AEROBIC AND ANAEROBIC Blood Culture adequate volume   Culture   Final    NO GROWTH 1 DAY Performed at [facility], [address], [place], [place] [address]    Report Status PENDING  Incomplete         Radiology Studies: ECHOCARDIOGRAM COMPLETE  Result Date: 12/08/2022    ECHOCARDIOGRAM  REPORT   Patient Name:   Marilyn Haas Date of Exam: 12/07/2022 Medical Rec #:  [id number]         Height:       61.0 in Accession #:    [id number]        Weight:       123.0 lb Date of Birth:  1941-01-06        BSA:          1.536 m Patient Age:    81 years          BP:           124/78 mmHg Patient Gender: F                 HR:           87 bpm. Exam Location:  [facility] Procedure: 2D Echo, Cardiac Doppler and Color Doppler Indications:     R55 Syncope  History:         Patient has prior history of Echocardiogram examinations, most                  recent 02/18/2016.  Sonographer:     Daphine Deutscher RDCS Referring Phys:  [id number] Vernetta Honey MANSY Diagnosing Phys: Yvonne Kendall MD IMPRESSIONS  1. Left ventricular ejection fraction, by estimation, is >55%. The left ventricle has normal function. Left ventricular endocardial border not optimally defined to evaluate regional wall motion. Left ventricular diastolic parameters are consistent with Grade I diastolic dysfunction (impaired relaxation).  2. Right ventricular systolic function is mildly reduced. The right ventricular size is normal.  3. The mitral valve is grossly normal. No evidence of mitral valve regurgitation. No evidence of mitral stenosis.  4. The aortic valve was not well visualized. Aortic valve regurgitation is mild. No aortic stenosis is present. FINDINGS  Left Ventricle: Left ventricular ejection fraction, by estimation, is >55%. The left ventricle has normal function. Left ventricular endocardial border not optimally defined to evaluate regional wall motion. The left ventricular internal cavity size was  normal in size. There is no left ventricular hypertrophy. Left ventricular diastolic parameters are  consistent with Grade I diastolic dysfunction (impaired relaxation). Right Ventricle: Pulmonary artery pressure is normal (RVSP 15-20 mmHg plus central venous/right atrial pressure). The right ventricular size is normal. Right vetricular wall  thickness was not well visualized. Right ventricular systolic function is mildly  reduced. Left Atrium: Left atrial size was normal in size. Right Atrium: Right atrial size was normal in size. Pericardium: The pericardium was not well visualized. Presence of epicardial fat layer. Mitral Valve: The mitral valve is grossly normal. No evidence of mitral valve regurgitation. No evidence of mitral valve stenosis. Tricuspid Valve: The tricuspid valve is grossly normal. Tricuspid valve regurgitation is trivial. Aortic Valve: The aortic valve was not well visualized. Aortic valve regurgitation is mild. No aortic stenosis is present. Aortic valve mean gradient measures 3.8 mmHg. Aortic valve peak gradient measures 5.1 mmHg. Aortic valve area, by VTI measures 2.57  cm. Pulmonic Valve: The pulmonic valve was not well visualized. Pulmonic valve regurgitation is not visualized. No evidence of pulmonic stenosis. Aorta: The aortic root is normal in size and structure. Pulmonary Artery: The pulmonary artery is not well seen. Venous: The inferior vena cava was not well visualized. IAS/Shunts: The interatrial septum was not well visualized.  LEFT VENTRICLE PLAX 2D LVIDd:         3.00 cm   Diastology LVIDs:         2.00 cm   LV e' medial:    5.62 cm/s LV PW:         0.60 cm   LV E/e' medial:  13.6 LV IVS:        0.60 cm   LV e' lateral:   6.02 cm/s LVOT diam:     2.00 cm   LV E/e' lateral: 12.7 LV SV:         71 LV SV Index:   46 LVOT Area:     3.14 cm  RIGHT VENTRICLE RV Basal diam:  2.90 cm RV S prime:     9.83 cm/s TAPSE (M-mode): 1.7 cm LEFT ATRIUM             Index        RIGHT ATRIUM           Index LA diam:        3.00 cm 1.95 cm/m   RA Area:     10.70 cm LA Vol (A2C):   19.6 ml 12.76 ml/m  RA Volume:   24.90 ml  16.21 ml/m LA Vol (A4C):   26.8 ml 17.45 ml/m LA Biplane Vol: 24.1 ml 15.69 ml/m  AORTIC VALVE AV Area (Vmax):    2.82 cm AV Area (Vmean):   2.31 cm AV Area (VTI):     2.57 cm AV Vmax:           113.44 cm/s  AV Vmean:          94.170 cm/s AV VTI:            0.275 m AV Peak Grad:      5.1 mmHg AV Mean Grad:      3.8 mmHg LVOT Vmax:         101.73 cm/s LVOT Vmean:        69.133 cm/s LVOT VTI:          0.225 m LVOT/AV VTI ratio: 0.82  AORTA Ao Root diam: 3.10 cm MITRAL VALVE               TRICUSPID VALVE MV Area (PHT): 3.37 cm  TR Peak grad:   18.3 mmHg MV Decel Time: 225 msec    TR Vmax:        214.00 cm/s MV E velocity: 76.25 cm/s MV A velocity: 85.90 cm/s  SHUNTS MV E/A ratio:  0.89        Systemic VTI:  0.23 m                            Systemic Diam: 2.00 cm Yvonne Kendall MD Electronically signed by Yvonne Kendall MD Signature Date/Time: 12/08/2022/9:59:20 AM    Final    EEG adult  Result Date: 12/07/2022 Charlsie Quest, MD     12/07/2022  5:13 PM Patient Name: Marilyn Haas MRN: [id number] Epilepsy Attending: Charlsie Quest Referring Physician/Provider: Hannah Beat, MD Date: 12/07/2022 Duration: 27.31 mins Patient history: 82yo F with ams getting eeg to evaluate for seizure Level of alertness: Awake AEDs during EEG study: None Technical aspects: This EEG study was done with scalp electrodes positioned according to the 10-20 International system of electrode placement. Electrical activity was reviewed with band pass filter of 1-70Hz , sensitivity of 7 uV/mm, display speed of 76mm/sec with a 60Hz  notched filter applied as appropriate. EEG data were recorded continuously and digitally stored.  Video monitoring was available and reviewed as appropriate. Description: The posterior dominant rhythm consists of 7 Hz activity of moderate voltage (25-35 uV) seen predominantly in posterior head regions, symmetric and reactive to eye opening and eye closing. EEG showed continuous generalized 6 to 7 Hz theta slowing. Hyperventilation and photic stimulation were not performed.   ABNORMALITY - Continuous slow, generalized IMPRESSION: This study is suggestive of mild diffuse encephalopathy. No seizures or epileptiform  discharges were seen throughout the recording. Charlsie Quest   MR BRAIN W WO CONTRAST  Result Date: 12/07/2022 CLINICAL DATA:  Delirium.  Seizure-like activity EXAM: MRI HEAD WITHOUT AND WITH CONTRAST TECHNIQUE: Multiplanar, multiecho pulse sequences of the brain and surrounding structures were obtained without and with intravenous contrast. CONTRAST:  5mL GADAVIST GADOBUTROL 1 MMOL/ML IV SOLN COMPARISON:  02/07/2022 FINDINGS: Brain: No acute infarction, hemorrhage, hydrocephalus, extra-axial collection or mass lesion. Chronic small vessel ischemia in the cerebral white matter. 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 Generalized cerebral volume loss which is fairly mild for age. No abnormal enhancement Vascular: Normal flow voids and vascular enhancements Skull and upper cervical spine: Heterogeneity of the calvarium that is stable and benign appearing by CT. Sinuses/Orbits: Unremarkable IMPRESSION: 1. No acute or reversible finding. 2. 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 No acute or Electronically Signed   By: Tiburcio Pea M.D.   On: 12/07/2022 11:38   CT HEAD WO CONTRAST ( )  Result Date: 12/07/2022 CLINICAL DATA:  Altered mental status EXAM: CT HEAD WITHOUT CONTRAST TECHNIQUE: Contiguous axial images were obtained from the base of the skull through the vertex without intravenous contrast. RADIATION DOSE REDUCTION: This exam was performed according to the departmental dose-optimization program which includes automated exposure control, adjustment of the mA and/or kV according to patient size and/or use of iterative reconstruction technique. COMPARISON:  02/06/2022 FINDINGS: Brain: Normal anatomic configuration. Parenchymal volume loss is commensurate with the patient's age. Mild periventricular white matter changes are present likely reflecting the sequela of small vessel ischemia. No abnormal intra or extra-axial mass lesion or fluid collection. No  abnormal mass effect or midline shift. No evidence of acute intracranial hemorrhage or infarct. Ventricular size  is normal. Cerebellum unremarkable. Vascular: No asymmetric hyperdense vasculature at the skull base. Skull: Intact Sinuses/Orbits: Paranasal sinuses are clear. Orbits are unremarkable. Other: Mastoid air cells and middle ear cavities are clear. IMPRESSION: 1. No acute intracranial hemorrhage or infarct. 2. Mild senescent change. Electronically Signed   By: Helyn Numbers M.D.   On: 12/07/2022 01:24   DG Chest Portable 1 View  Result Date: 12/07/2022 CLINICAL DATA:  Altered mental status EXAM: PORTABLE CHEST 1 VIEW COMPARISON:  02/06/2022 FINDINGS: Lungs are clear. No pneumothorax or pleural effusion. Large hiatal hernia. Cardiac size within normal limits. No acute bone abnormality. IMPRESSION: 1. No active disease. 2. Large hiatal hernia. Electronically Signed   By: Helyn Numbers M.D.   On: 12/07/2022 01:10        Scheduled Meds:  sodium chloride   Intravenous Once   sodium chloride   Intravenous Once   ARIPiprazole  2 mg Oral Daily   cyanocobalamin  1,000 mcg Oral Daily   DULoxetine  60 mg Oral Daily   folic acid  1 mg Oral Daily   midodrine  10 mg Oral TID WC   multivitamin with minerals  1 tablet Oral Daily   pantoprazole (PROTONIX) IV  40 mg Intravenous Q12H   sodium chloride flush  3 mL Intravenous Q12H   tamoxifen  10 mg Oral QODAY   [START ON 12/10/2022] thiamine (VITAMIN B1) injection  100 mg Intravenous Daily   Continuous Infusions:  0.9 % NaCl with KCl 20 mEq / L 100 mL/hr at 12/07/22 2258   thiamine (VITAMIN B1) injection 500 mg (12/08/22 0548)     LOS: 1 day     Silvano Bilis, MD [facility]   If 7PM-7AM, please contact night-coverage [email or web address] Password [facility] 12/08/2022, 11:54 AM

## 2022-12-08 NOTE — Telephone Encounter (Signed)
Patient called to report patient is in hospital.

## 2022-12-08 NOTE — Anesthesia Postprocedure Evaluation (Signed)
Anesthesia Post Note  Patient: [employer]  Procedure(s) Performed: ESOPHAGOGASTRODUODENOSCOPY (EGD) WITH PROPOFOL HEMOSTASIS CLIP PLACEMENT HEMOSTASIS CONTROL  Patient location during evaluation: PACU Anesthesia Type: General Level of consciousness: awake and alert and confused Pain management: pain level controlled Vital Signs Assessment: post-procedure vital signs reviewed and stable Respiratory status: spontaneous breathing, nonlabored ventilation and respiratory function stable Cardiovascular status: blood pressure returned to baseline and stable Postop Assessment: no apparent nausea or vomiting Anesthetic complications: no   No notable events documented.   Last Vitals:  Vitals:   12/08/22 1705 12/08/22 1714  BP: 110/81 113/61  Pulse: (!) 103 (!) 107  Resp: 18 18  Temp:    SpO2: 100% 95%    Last Pain:  Vitals:   12/08/22 1705  TempSrc:   PainSc: Asleep                 Foye Deer

## 2022-12-08 NOTE — Progress Notes (Deleted)
Cardiology Office Note:    Date:  12/08/2022   ID:  Raj, Whitmer 08/22/1940, MRN [id number]  PCP:  Elfredia Nevins, MD  [facility] Cardiologist:  Nona Dell, MD  [facility] Electrophysiologist:  None   Referring MD: Elfredia Nevins, MD   Chief Complaint: 1 year follow-up  History of Present Illness:    Marilyn Haas is a 82 y.o. female with a hx of lower extremity edema, hyperlipidemia, palpitations (PVCs by prior monitor) and breast cancer status postlumpectomy presents for follow-up.  Patient was last seen in July 2023 and was overall stable from a cardiac perspective.  Past Medical History:  Diagnosis Date   Anemia    Anxiety    Arthritis    Breast cancer (HCC) 05/19/2016   Right breast   Chronic back pain    Scoliosis, stenosis   Depression    Diverticulosis    GERD (gastroesophageal reflux disease)    Hard of hearing    History of blood transfusion    History of colon polyps    History of hiatal hernia    History of pneumonia    Hypertension    Insomnia    Osteopenia    Osteoporosis    Personal history of radiation therapy    Thickened endometrium 09/29/2017    Past Surgical History:  Procedure Laterality Date   BIOPSY  02/07/2022   Procedure: BIOPSY;  Surgeon: Dolores Frame, MD;  [facility]: [facility];  Service: Gastroenterology;;   BREAST LUMPECTOMY WITH RADIOACTIVE SEED AND SENTINEL LYMPH NODE BIOPSY Right 07/31/2016   Procedure: BREAST LUMPECTOMY WITH RADIOACTIVE SEED AND SENTINEL LYMPH NODE BIOPSY, INJECT BLUE DYE RIGHT BREAST;  Surgeon: Claud Kelp, MD;  Location: [facility];  Service: General;  Laterality: Right;   BREAST LUMPECTOMY WITH RADIOACTIVE SEED LOCALIZATION Left 05/07/2021   Procedure: LEFT BREAST LUMPECTOMY WITH RADIOACTIVE SEED LOCALIZATION;  Surgeon: Harriette Bouillon, MD;  Location: [facility];  Service: General;  Laterality: Left;   COLONOSCOPY     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 07/04/2014    Procedure: LEFT CONVERSION TO TOTAL HIP ARTHROPLASTY;  Surgeon: Ollen Gross, MD;  Location: [facility];  Service: Orthopedics;  Laterality: Left;   ESOPHAGEAL DILATION N/A 09/24/2017   Procedure: ESOPHAGEAL DILATION;  Surgeon: Malissa Hippo, MD;  [facility]: [facility];  Service: Endoscopy;  Laterality: N/A;   ESOPHAGEAL DILATION N/A 01/19/2020   Procedure: ESOPHAGEAL DILATION;  Surgeon: Dolores Frame, MD;  [facility];  Service: Gastroenterology;  Laterality: N/A;   ESOPHAGEAL DILATION  02/28/2020   Procedure: ESOPHAGEAL DILATION;  Surgeon: Malissa Hippo, MD;  [facility];  Service: Endoscopy;;   ESOPHAGOGASTRODUODENOSCOPY N/A 01/26/2014   Procedure: ESOPHAGOGASTRODUODENOSCOPY (EGD);  Surgeon: Malissa Hippo, MD;  [facility];  Service: Endoscopy;  Laterality: N/A;  1030   ESOPHAGOGASTRODUODENOSCOPY (EGD) WITH PROPOFOL N/A 09/24/2017   Procedure: ESOPHAGOGASTRODUODENOSCOPY (EGD) WITH PROPOFOL;  Surgeon: Malissa Hippo, MD;  [facility];  Service: Endoscopy;  Laterality: N/A;  11:15   ESOPHAGOGASTRODUODENOSCOPY (EGD) WITH PROPOFOL N/A 01/19/2020   Procedure: ESOPHAGOGASTRODUODENOSCOPY (EGD) WITH PROPOFOL;  Surgeon: Dolores Frame, MD;  [facility];  Service: Gastroenterology;  Laterality: N/A;  1200   ESOPHAGOGASTRODUODENOSCOPY (EGD) WITH PROPOFOL N/A 02/28/2020   Procedure: ESOPHAGOGASTRODUODENOSCOPY (EGD) WITH PROPOFOL;  Surgeon: Malissa Hippo, MD;  [facility];  Service: Endoscopy;  Laterality: N/A;  2:45   ESOPHAGOGASTRODUODENOSCOPY (EGD) WITH PROPOFOL N/A 02/07/2022  Procedure: ESOPHAGOGASTRODUODENOSCOPY (EGD) WITH PROPOFOL;  Surgeon: Dolores Frame, MD;  [facility];  Service: Gastroenterology;  Laterality: N/A;   HIP FRACTURE SURGERY     05/2009 left -[place]   HYSTEROSCOPY WITH D & C  05/24/2018   Procedure: DILATATION AND CURETTAGE /HYSTEROSCOPY (PROCEDURE # 2)/PAP  SMEAR (PROCEDURE #1);  Surgeon: Tilda Burrow, MD;  Location: [facility];  Service: Gynecology;;   [name] DILATION N/A 01/26/2014   Procedure: Alvy Beal;  Surgeon: Malissa Hippo, MD;  [facility];  Service: Endoscopy;  Laterality: N/A;    Current Medications: No outpatient medications have been marked as taking for the 12/08/22 encounter (Appointment) with Fransico Michael, Shaquetta Arcos H, PA-C.     Allergies:   Avelox [moxifloxacin hcl in nacl]   Social History   Socioeconomic History   Marital status: Widowed    Spouse name: Not on file   Number of children: 4   Years of education: 44   Highest education level: 12th grade  Occupational History    Employer: RETIRED  Tobacco Use   Smoking status: Former    Current packs/day: 0.00    Average packs/day: 0.5 packs/day for 2.0 years (1.0 ttl pk-yrs)    Types: Cigarettes    Start date: 01/16/1959    Quit date: 01/15/1961    Years since quitting: 61.9    Passive exposure: Past   Smokeless tobacco: Never   Tobacco comments:    Verified by Silas Flood  Vaping Use   Vaping status: Never Used  Substance and Sexual Activity   Alcohol use: Yes    Alcohol/week: 15.0 standard drinks of alcohol    Types: 15 Cans of beer per week    Comment: 4 "light" beers daily   Drug use: No   Sexual activity: Not Currently    Partners: Male    Birth control/protection: Post-menopausal  Other Topics Concern   Not on file  Social History Narrative   Not on file   Social Determinants of Health   Financial Resource Strain: Low Risk  (10/26/2022)   Overall Financial Resource Strain (CARDIA)    Difficulty of Paying Living Expenses: Not hard at all  Food Insecurity: No Food Insecurity (09/25/2022)   Hunger Vital Sign    Worried About Running Out of Food in the Last Year: Never true    Ran Out of Food in the Last Year: Never true  Transportation Needs: No Transportation Needs (10/26/2022)   PRAPARE - Scientist, research (physical sciences) (Medical): No    Lack of Transportation (Non-Medical): No  Physical Activity: Inactive (03/23/2022)   Exercise Vital Sign    Days of Exercise per Week: 0 days    Minutes of Exercise per Session: 0 min  Stress: No Stress Concern Present (03/23/2022)   [employer] of Occupational Health - Occupational Stress Questionnaire    Feeling of Stress : Only a little  Social Connections: Socially Isolated (03/23/2022)   Social Connection and Isolation Panel [NHANES]    Frequency of Communication with Friends and Family: More than three times a week    Frequency of Social Gatherings with Friends and Family: More than three times a week    Attends Religious Services: Never    Database administrator or Organizations: No    Attends Banker Meetings: Never    Marital Status: Widowed     Family History: The patient's ***family history includes Cancer in her maternal aunt; Depression in her  mother and sister; Diabetes in her mother, sister, and sister; Heart failure in her mother and sister; Hypertension in her mother and sister; Other in her father.  ROS:   Please see the history of present illness.    *** All other systems reviewed and are negative.  EKGs/Labs/Other Studies Reviewed:    The following studies were reviewed today: ***  EKG:  EKG is *** ordered today.  The ekg ordered today demonstrates ***  Recent Labs: 12/07/2022: ALT 47; Magnesium 1.9; TSH 3.054 12/08/2022: BUN 23; Creatinine, Ser 0.70; Hemoglobin 9.1; Platelets 144; Potassium 4.2; Sodium 128  Recent Lipid Panel    Component Value Date/Time   CHOL 209 (H) 12/13/2014 1132   CHOL 150 02/17/2013 0934   TRIG 95 12/13/2014 1132   TRIG 111 02/17/2013 0934   HDL 114 12/13/2014 1132   HDL 40 02/17/2013 0934   CHOLHDL 1.8 12/13/2014 1132   VLDL 19 12/13/2014 1132   LDLCALC 76 12/13/2014 1132   LDLCALC 88 02/17/2013 0934     Risk Assessment/Calculations:   {Does this patient have ATRIAL  FIBRILLATION?[phone]}   Physical Exam:    VS:  There were no vitals taken for this visit.    Wt Readings from Last 3 Encounters:  12/07/22 123 lb (55.8 kg)  03/04/22 151 lb (68.5 kg)  02/26/22 154 lb 12.8 oz (70.2 kg)     GEN: *** Well nourished, well developed in no acute distress HEENT: Normal NECK: No JVD; No carotid bruits LYMPHATICS: No lymphadenopathy CARDIAC: ***RRR, no murmurs, rubs, gallops RESPIRATORY:  Clear to auscultation without rales, wheezing or rhonchi  ABDOMEN: Soft, non-tender, non-distended MUSCULOSKELETAL:  No edema; No deformity  SKIN: Warm and dry NEUROLOGIC:  Alert and oriented x 3 PSYCHIATRIC:  Normal affect   ASSESSMENT:    No diagnosis found. PLAN:    In order of problems listed above:  ***  Disposition: Follow up {follow up:15908} with ***   Shared Decision Making/Informed Consent   {Are you ordering a CV Procedure (e.g. stress test, cath, DCCV, TEE, etc)?   Press F2        :[id number]}    Signed, Marialice Newkirk Ardelle Lesches  12/08/2022 10:37 AM    [facility]

## 2022-12-08 NOTE — Plan of Care (Signed)
Problem: Education: Goal: Knowledge of General Education information will improve Description: Including pain rating scale, medication(s)/side effects and non-pharmacologic comfort measures Outcome: Progressing   Problem: Clinical Measurements: Goal: Cardiovascular complication will be avoided Outcome: Progressing   Problem: Activity: Goal: Risk for activity intolerance will decrease Outcome: Progressing   Problem: Safety: Goal: Ability to remain free from injury will improve Outcome: Progressing

## 2022-12-08 NOTE — Telephone Encounter (Signed)
Noted, patient last seen 09/2021,had appointment today with [facility],rescheduled

## 2022-12-08 NOTE — Evaluation (Signed)
Physical Therapy Evaluation Patient Details Name: Marilyn Haas MRN: [id number] DOB: 03-14-1941 Today's Date: 12/08/2022  History of Present Illness  Pt is an 82 yo female that presented for AMS, possible seizure, s/p transfusion this admission. Past medical history significant for daily alcohol use, depression on Abilify, chronic pain on chronic opiates, intermittent diarrhea, history of GI bleed and anemia, breast cancer on tamoxifen s/p surgery in 2018 and a lumpectomy in late 2022 with some atypia, very hard of hearing at baseline.   Clinical Impression  Pt alert, oriented to self only, did not display pain signs/symptoms throughout session. Per stated she live at home alone and her son checks in intermittently, uses a walker but could not describe ADL assistance (pt very HOH). MinA for bed mobility, pt fearful and self limiting with all mobility attempts. Sit <> Stand with RW and minA, and able to take a few steps forwards/backwards, and laterally before spontaneously returning to sitting.  Overall the patient demonstrated deficits (see "PT Problem List") that impede the patient's functional abilities, safety, and mobility and would benefit from skilled PT intervention.        If plan is discharge home, recommend the following: A lot of help with walking and/or transfers;A lot of help with bathing/dressing/bathroom;Assistance with feeding;Assist for transportation;Assistance with cooking/housework;Supervision due to cognitive status;Help with stairs or ramp for entrance;Direct supervision/assist for medications management   Can travel by private vehicle   Yes    Equipment Recommendations Other (comment) (TBD at next level of care)  Recommendations for Other Services       Functional Status Assessment Patient has had a recent decline in their functional status and demonstrates the ability to make significant improvements in function in a reasonable and predictable amount of time.      Precautions / Restrictions Precautions Precautions: Fall Restrictions Weight Bearing Restrictions: No      Mobility  Bed Mobility Overal bed mobility: Needs Assistance Bed Mobility: Supine to Sit, Sit to Supine     Supine to sit: Min assist Sit to supine: Contact guard assist        Transfers Overall transfer level: Needs assistance Equipment used: Rolling walker (2 wheels) Transfers: Sit to/from Stand, Bed to chair/wheelchair/BSC Sit to Stand: Min assist                Ambulation/Gait Ambulation/Gait assistance: Contact guard assist Gait Distance (Feet): 5 Feet           General Gait Details: pt able to step forwards, backwards, and to the left with encouragement, but spontaneously returned to sitting  Stairs            Wheelchair Mobility     Tilt Bed    Modified Rankin (Stroke Patients Only)       Balance Overall balance assessment: Needs assistance Sitting-balance support: No upper extremity supported, Feet supported Sitting balance-Leahy Scale: Good     Standing balance support: During functional activity, Bilateral upper extremity supported Standing balance-Leahy Scale: Fair                               Pertinent Vitals/Pain Pain Assessment Pain Assessment: Faces Faces Pain Scale: No hurt    Home Living Family/patient expects to be discharged to:: Private residence Living Arrangements: Alone Available Help at Discharge: Family Type of Home: House Home Access: Stairs to enter Entrance Stairs-Rails: None Entrance Stairs-Number of Steps: 2   Home Layout: Two level;Able to  live on main level with bedroom/bathroom;Full bath on main level Home Equipment: Rolling Walker (2 wheels);Rollator (4 wheels);BSC/3in1;Shower seat Additional Comments: per chart pt from hotel    Prior Function Prior Level of Function : Patient poor historian/Family not available             Mobility Comments: reports rollator use        Extremity/Trunk Assessment   Upper Extremity Assessment Upper Extremity Assessment: Generalized weakness    Lower Extremity Assessment Lower Extremity Assessment: Generalized weakness       Communication   Communication Communication: Hearing impairment Cueing Techniques: Verbal cues  Cognition Arousal: Alert Behavior During Therapy: WFL for tasks assessed/performed Overall Cognitive Status: Impaired/Different from baseline Area of Impairment: Orientation, Following commands, Problem solving, Safety/judgement                 Orientation Level: Disoriented to, Place     Following Commands: Follows one step commands with increased time, Follows one step commands consistently Safety/Judgement: Decreased awareness of deficits, Decreased awareness of safety   Problem Solving: Slow processing, Decreased initiation          General Comments General comments (skin integrity, edema, etc.): sacral dressing replaced    Exercises     Assessment/Plan    PT Assessment Patient needs continued PT services  PT Problem List Pain;Decreased strength;Decreased range of motion;Decreased activity tolerance;Decreased balance;Decreased mobility;Decreased knowledge of use of DME       PT Treatment Interventions DME instruction;Neuromuscular re-education;Gait training;Stair training;Patient/family education;Functional mobility training;Therapeutic activities;Therapeutic exercise;Balance training    PT Goals (Current goals can be found in the Care Plan section)  Acute Rehab PT Goals Patient Stated Goal: to feel better PT Goal Formulation: With patient Time For Goal Achievement: 12/22/22 Potential to Achieve Goals: Fair    Frequency Min 1X/week     Co-evaluation               AM-PAC PT "6 Clicks" Mobility  Outcome Measure Help needed turning from your back to your side while in a flat bed without using bedrails?: A Little Help needed moving from lying on your back  to sitting on the side of a flat bed without using bedrails?: A Little Help needed moving to and from a bed to a chair (including a wheelchair)?: A Little Help needed standing up from a chair using your arms (e.g., wheelchair or bedside chair)?: A Little Help needed to walk in hospital room?: A Little Help needed climbing 3-5 steps with a railing? : A Lot 6 Click Score: 17    End of Session Equipment Utilized During Treatment: Gait belt Activity Tolerance: Patient tolerated treatment well;Other (comment) (pt self limiting, complained of dizziness) Patient left: in bed;with call bell/phone within reach;with bed alarm set Nurse Communication: Mobility status PT Visit Diagnosis: Other abnormalities of gait and mobility (R26.89);Muscle weakness (generalized) (M62.81);Difficulty in walking, not elsewhere classified (R26.2)    Time: 9147-8295 PT Time Calculation (min) (ACUTE ONLY): 15 min   Charges:   PT Evaluation $PT Eval Low Complexity: 1 Low PT Treatments $Therapeutic Activity: 8-22 mins PT General Charges $$ ACUTE PT VISIT: 1 Visit         Olga Coaster PT, DPT 1:04 PM,12/08/22

## 2022-12-08 NOTE — Op Note (Signed)
[facility] Gastroenterology Patient Name: [place] Procedure Date: 12/08/2022 4:20 PM MRN: [id number] Account #: [id number] Date of Birth: 17-Jul-1940 Admit Type: Outpatient Age: 82 Room: [facility] ENDO ROOM 1 Gender: Female Note Status: Finalized Instrument Name: Upper Endoscope [id number] Procedure:             Upper GI endoscopy Indications:           Acute post hemorrhagic anemia, Melena Providers:             Toney Reil MD, MD Medicines:             General Anesthesia Complications:         No immediate complications. Procedure:             Pre-Anesthesia Assessment:                        - Prior to the procedure, a History and Physical was                         performed, and patient medications and allergies were                         reviewed. The patient is competent. The risks and                         benefits of the procedure and the sedation options and                         risks were discussed with the patient. All questions                         were answered and informed consent was obtained.                         Patient identification and proposed procedure were                         verified by the physician, the nurse, the                         anesthesiologist, the anesthetist and the technician                         in the pre-procedure area in the procedure room in the                         endoscopy suite. Mental Status Examination: alert and                         oriented. Airway Examination: normal oropharyngeal                         airway and neck mobility. Respiratory Examination:                         clear to auscultation. CV Examination: normal.                         Prophylactic Antibiotics: The patient  does not require                         prophylactic antibiotics. Prior Anticoagulants: The                         patient has taken no anticoagulant or antiplatelet                          agents. ASA Grade Assessment: III - A patient with                         severe systemic disease. After reviewing the risks and                         benefits, the patient was deemed in satisfactory                         condition to undergo the procedure. The anesthesia                         plan was to use general anesthesia. Immediately prior                         to administration of medications, the patient was                         re-assessed for adequacy to receive sedatives. The                         heart rate, respiratory rate, oxygen saturations,                         blood pressure, adequacy of pulmonary ventilation, and                         response to care were monitored throughout the                         procedure. The physical status of the patient was                         re-assessed after the procedure.                        After obtaining informed consent, the endoscope was                         passed under direct vision. Throughout the procedure,                         the patient's blood pressure, pulse, and oxygen                         saturations were monitored continuously. The Endoscope                         was introduced through the mouth, and advanced to the  second part of duodenum. The upper GI endoscopy was                         accomplished without difficulty. The patient tolerated                         the procedure well. Findings:      One non-bleeding cratered duodenal ulcer with a nonbleeding visible       vessel (Forrest Class IIa) was found in the second portion of the       duodenum. The lesion was 20 mm in largest dimension. Area was       successfully injected with 9 mL of a 0.1 mg/mL solution of epinephrine       for hemostasis. Coagulation for hemostasis using bipolar probe was       unsuccessful. To stop active bleeding, three hemostatic clips were       successfully placed (MR  safe). Clip manufacturer: [employer].       There was no bleeding at the end of the procedure. For hemostasis,       hemostatic spray was deployed. Multiple sprays were applied. There was       no bleeding at the end of the procedure.      Multiple dispersed diminutive erosions with no bleeding and no stigmata       of recent bleeding were found at the incisura and in the gastric antrum.      A large hiatal hernia was present.      The gastroesophageal junction and examined esophagus were normal. Impression:            - Non-bleeding duodenal ulcer with a nonbleeding                         visible vessel (Forrest Class IIa). Injected.                         Treatment not successful. Treated with bipolar                         cautery. Clip manufacturer: [employer]. Clips                         (MR safe) were placed. hemostatic spray applied.                        - Erosive gastropathy with no bleeding and no stigmata                         of recent bleeding.                        - Large hiatal hernia.                        - Normal gastroesophageal junction and esophagus.                        - No specimens collected. Recommendation:        - Return patient to hospital ward for ongoing care.                        -  NPO today except ice chips and meds.                        - Continue present medications.                        - Switch to protonix drip.                        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 All rights reserved. The codes documented in this report are preliminary and upon coder review may  be revised to meet current compliance requirements. Dr. Libby Maw Toney Reil MD, MD 12/08/2022 4:55:33 PM This report has been signed electronically. Number of Addenda: 0 Note Initiated On: 12/08/2022 4:20 PM Estimated Blood Loss:  Estimated blood loss: none.      [facility]

## 2022-12-08 NOTE — Transfer of Care (Signed)
Immediate Anesthesia Transfer of Care Note  Patient: Marilyn Haas  Procedure(s) Performed: ESOPHAGOGASTRODUODENOSCOPY (EGD) WITH PROPOFOL HEMOSTASIS CLIP PLACEMENT HEMOSTASIS CONTROL  Patient Location: PACU  Anesthesia Type:MAC  Level of Consciousness: drowsy and pateint uncooperative  Airway & Oxygen Therapy: Patient Spontanous Breathing and Patient connected to face mask oxygen  Post-op Assessment: Report given to RN and Post -op Vital signs reviewed and unstable, Anesthesiologist notified  Post vital signs: Reviewed  Last Vitals:  Vitals Value Taken Time  BP 90/58 12/08/22 1659  Temp 35.7 C 12/08/22 1654  Pulse 108 12/08/22 1700  Resp 19 12/08/22 1700  SpO2 93 % 12/08/22 1700  Vitals shown include unfiled device data.  Last Pain:  Vitals:   12/08/22 1654  TempSrc: Temporal  PainSc: Asleep         Complications: No notable events documented.

## 2022-12-08 NOTE — Progress Notes (Signed)
EGD showed cratered duodenal ulcer with visible vessel, injected APC, cautery opened up vessel, started bleeding, placed a 3 clips followed by hemostatic spray, finally controlled the bleeding.  She has to be n.p.o. for next 24 hours and ordered stat CBC followed by every 6-8 hours.  Switch to Protonix bolus and drip.  If she bleeds again, consult IR or vascular surgery for embolization  Updated her son, Genevie Cheshire about EGD findings   Lannette Donath, MD

## 2022-12-08 NOTE — Consult Note (Signed)
WOC Nurse Consult Note: Reason for Consult: L breast and sacral wounds  Wound type:1.  Left breast Irritant Contact Dermatitis  ICD-10 CM Codes for Irritant Dermatitis  L30.4  - Erythema intertrigo. Also used for abrasion of the hand, chafing of the skin, dermatitis due to sweating and friction, friction dermatitis, friction eczema, and genital/thigh intertrigo.  2. Moisture Associated Skin Damage buttocks/sacrum  ICD-10 CM Codes for Irritant Dermatitis L24A2 - Due to fecal, urinary or dual incontinence 3.  Stage 3 L buttock 2 cm x 2 cm 75% pink moist 25% yellow  Pressure Injury POA: Yes Measurement: as above  Wound bed: as above  Drainage (amount, consistency, odor) minimal serosanguinous underneath L breast and L buttock Periwound: erythema and partial thickness skin loss noted around L buttock wound  Dressing procedure/placement/frequency:  Clean underneath L breast with soap and water, dry and apply Interdry [employer] # [phone] Measure and cut length of InterDry to fit in skin folds that have skin breakdown Tuck InterDry fabric into skin folds in a single layer, allow for 2 inches of overhang from skin edges to allow for wicking to occur May remove to bathe; dry area thoroughly and then tuck into affected areas again Do not apply any creams or ointments when using InterDry DO NOT THROW AWAY FOR 5 DAYS unless soiled with stool DO NOT [facility] product, this will inactivate the silver in the material  New sheet of Interdry should be applied after 5 days of use if patient continues to have skin breakdown   2.  Clean buttocks and sacrum with soap and water, dry and apply Gerhardt's Butt Cream 3 times a day and prn soiling.  3.  Clean L buttock wound with NS, apply Medihoney to wound bed daily and cover with dry gauze.    Patent noted to have very dry flaky skin to feet and legs;  Eucerin ordered twice daily.    POC discussed with patient and bedside nurse. WOC team will not follow.   Re-consult if further need arise.   Thank you,    Priscella Mann MSN, RN-BC, [employer] [phone]

## 2022-12-08 NOTE — Evaluation (Addendum)
Occupational Therapy Evaluation Patient Details Name: Marilyn Haas MRN: [id number] DOB: 01-16-1941 Today's Date: 12/08/2022   History of Present Illness Marilyn Haas is a 82 y.o. Caucasian female with medical history significant for anxiety, osteoarthritis, GERD, HOH, depression, diverticulosis, chronic back pain, hypertension and osteoporosis, who presented to the emergency room with acute onset of altered mental status with confusion.   Clinical Impression   Marilyn Haas was seen for OT evaluation this date. Pt poor historian, per chart at hotel, pt reports using 4WW for mobility. Oriented to self and time only. Pt currently requires MIN A + HHA for BSC t/f, CGA pericare standing - cues for initiating. MIN A don underwear, assist for pulling up over rear in standing.   Orthostatics taken, no significant change noted, pt denies dizziness until end of session. Pt would benefit from skilled OT to address noted impairments and functional limitations (see below for any additional details). Upon hospital discharge, recommend OT follow up. If pt plan to return to home would require +1 assist and supervision for all mobility/ADLs.    If plan is discharge home, recommend the following: A little help with walking and/or transfers;A little help with bathing/dressing/bathroom;Direct supervision/assist for medications management;Help with stairs or ramp for entrance    Functional Status Assessment  Patient has had a recent decline in their functional status and demonstrates the ability to make significant improvements in function in a reasonable and predictable amount of time.  Equipment Recommendations  BSC/3in1    Recommendations for Other Services       Precautions / Restrictions Precautions Precautions: Fall Restrictions Weight Bearing Restrictions: No      Mobility Bed Mobility Overal bed mobility: Needs Assistance Bed Mobility: Supine to Sit, Sit to Supine     Supine to sit:  Supervision Sit to supine: Supervision        Transfers Overall transfer level: Needs assistance Equipment used: Rolling walker (2 wheels) Transfers: Sit to/from Stand, Bed to chair/wheelchair/BSC Sit to Stand: Min assist     Step pivot transfers: Min assist     General transfer comment: CGA standing from BSC, MIN A from bed height      Balance Overall balance assessment: Needs assistance Sitting-balance support: No upper extremity supported, Feet supported Sitting balance-Leahy Scale: Good     Standing balance support: Single extremity supported, During functional activity Standing balance-Leahy Scale: Fair                             ADL either performed or assessed with clinical judgement   ADL Overall ADL's : Needs assistance/impaired                                       General ADL Comments: MIN A + HHA for BSC t/f, CGA pericare standing. MIN A don underwear, assist for pulling up over rear in standing.      Pertinent Vitals/Pain Pain Assessment Pain Assessment: Faces Faces Pain Scale: No hurt     Extremity/Trunk Assessment Upper Extremity Assessment Upper Extremity Assessment: Generalized weakness   Lower Extremity Assessment Lower Extremity Assessment: Generalized weakness       Communication Communication Communication: Hearing impairment Cueing Techniques: Verbal cues   Cognition Arousal: Alert Behavior During Therapy: WFL for tasks assessed/performed Overall Cognitive Status: Impaired/Different from baseline Area of Impairment: Orientation, Following commands, Problem solving, Safety/judgement  Orientation Level: Disoriented to, Place     Following Commands: Follows one step commands with increased time, Follows one step commands consistently Safety/Judgement: Decreased awareness of deficits, Decreased awareness of safety   Problem Solving: Slow processing, Decreased initiation        General Comments  sacral dressing replaced            Home Living Family/patient expects to be discharged to:: Unsure                                 Additional Comments: per chart pt from hotel      Prior Functioning/Environment Prior Level of Function : Patient poor historian/Family not available             Mobility Comments: reports rollator use          OT Problem List: Decreased strength;Decreased range of motion;Decreased activity tolerance;Impaired balance (sitting and/or standing);Decreased safety awareness      OT Treatment/Interventions: Self-care/ADL training;Therapeutic exercise;Energy conservation;DME and/or AE instruction;Therapeutic activities;Patient/family education;Balance training    OT Goals(Current goals can be found in the care plan section) Acute Rehab OT Goals Patient Stated Goal: to go home OT Goal Formulation: With patient Time For Goal Achievement: 12/22/22 Potential to Achieve Goals: Good ADL Goals Pt Will Perform Grooming: with modified independence;standing Pt Will Perform Lower Body Dressing: with modified independence;sit to/from stand Pt Will Transfer to Toilet: with supervision;ambulating;regular height toilet Pt Will Perform Toileting - Clothing Manipulation and hygiene: with modified independence;sitting/lateral leans;sit to/from stand  OT Frequency: Min 1X/week    Co-evaluation              AM-PAC OT "6 Clicks" Daily Activity     Outcome Measure Help from another person eating meals?: None Help from another person taking care of personal grooming?: A Little Help from another person toileting, which includes using toliet, bedpan, or urinal?: A Little Help from another person bathing (including washing, rinsing, drying)?: A Little Help from another person to put on and taking off regular upper body clothing?: A Little Help from another person to put on and taking off regular lower body clothing?: A Little 6  Click Score: 19   End of Session Equipment Utilized During Treatment: Rolling walker (2 wheels)  Activity Tolerance: Patient tolerated treatment well Patient left: in bed;with call bell/phone within reach;with bed alarm set  OT Visit Diagnosis: Other abnormalities of gait and mobility (R26.89);Muscle weakness (generalized) (M62.81)                Time: 6213-0865 OT Time Calculation (min): 40 min Charges:  OT General Charges $OT Visit: 1 Visit OT Evaluation $OT Eval Moderate Complexity: 1 Mod OT Treatments $Self Care/Home Management : 23-37 mins  Kathie Dike, M.S. OTR/L  12/08/22, 12:18 PM  ascom [phone]

## 2022-12-08 NOTE — Anesthesia Preprocedure Evaluation (Addendum)
Anesthesia Evaluation  Patient identified by MRN, date of birth, ID band Patient awake and Patient confused    Reviewed: Allergy & Precautions, H&P , NPO status , Patient's Chart, lab work & pertinent test results, reviewed documented beta blocker date and time   Airway Mallampati: III  TM Distance: >3 FB Neck ROM: full    Dental  (+) Edentulous Upper, Edentulous Lower   Pulmonary former smoker   Pulmonary exam normal breath sounds clear to auscultation       Cardiovascular Exercise Tolerance: Good hypertension,  Rhythm:regular Rate:Normal     Neuro/Psych  PSYCHIATRIC DISORDERS Anxiety Depression    chronic pain on chronic opiates very hard of hearing at baseline  negative psych ROS   GI/Hepatic hiatal hernia,GERD  ,,(+)     substance abuse  alcohol useintermittent diarrhea, history of GI bleed and anemia   Endo/Other  negative endocrine ROS    Renal/GU negative Renal ROS  negative genitourinary   Musculoskeletal   Abdominal Normal abdominal exam  (+)   Peds  Hematology  (+) Blood dyscrasia, anemia   Anesthesia Other Findings Acute on chronic anemia 1 unit prbc today with correction of hypotension.  Per IM: # Acute encephalopathy Hyponatremia, anemia most likely contributors currently. Etoh may also contribute, as could home xanax. EEG negative, MRI nothing acute. Neurology consulted, has signed off. Mental status improving. - continue high-dose thiamine - f/u blood cultures, ngtd   # Hyponatremia, hypoosmolar With low sodium and elevated urine osm suggestive of hypovolemia though also on meds that can cause siadh. Tsh wnl. Improving with fluids, 121>128 - continue fluids and trend  - decreased duloxetine from 120 to 60 - cortisol is borderline at 12   Reproductive/Obstetrics negative OB ROS                             Anesthesia Physical Anesthesia Plan  ASA: 3  Anesthesia  Plan: General   Post-op Pain Management:    Induction: Intravenous  PONV Risk Score and Plan: Propofol infusion  Airway Management Planned: Natural Airway  Additional Equipment:   Intra-op Plan:   Post-operative Plan:   Informed Consent: I have reviewed the patients History and Physical, chart, labs and discussed the procedure including the risks, benefits and alternatives for the proposed anesthesia with the patient or authorized representative who has indicated his/her understanding and acceptance.     Dental Advisory Given and Consent reviewed with POA  Plan Discussed with: CRNA  Anesthesia Plan Comments:        Anesthesia Quick Evaluation

## 2022-12-09 ENCOUNTER — Encounter: Payer: Self-pay | Admitting: Gastroenterology

## 2022-12-09 DIAGNOSIS — K449 Diaphragmatic hernia without obstruction or gangrene: Secondary | ICD-10-CM | POA: Diagnosis not present

## 2022-12-09 DIAGNOSIS — K760 Fatty (change of) liver, not elsewhere classified: Secondary | ICD-10-CM | POA: Diagnosis not present

## 2022-12-09 DIAGNOSIS — E871 Hypo-osmolality and hyponatremia: Secondary | ICD-10-CM | POA: Diagnosis not present

## 2022-12-09 DIAGNOSIS — K264 Chronic or unspecified duodenal ulcer with hemorrhage: Secondary | ICD-10-CM | POA: Diagnosis not present

## 2022-12-09 DIAGNOSIS — F1090 Alcohol use, unspecified, uncomplicated: Secondary | ICD-10-CM | POA: Diagnosis not present

## 2022-12-09 LAB — BASIC METABOLIC PANEL
Anion gap: 4 — ABNORMAL LOW (ref 5–15)
BUN: 18 mg/dL (ref 8–23)
CO2: 17 mmol/L — ABNORMAL LOW (ref 22–32)
Calcium: 7.7 mg/dL — ABNORMAL LOW (ref 8.9–10.3)
Chloride: 108 mmol/L (ref 98–111)
Creatinine, Ser: 0.78 mg/dL (ref 0.44–1.00)
GFR, Estimated: >60 mL/min (ref >60)
Glucose, Bld: 88 mg/dL (ref 70–99)
Potassium: 4.4 mmol/L (ref 3.5–5.1)
Sodium: 129 mmol/L — ABNORMAL LOW (ref 135–145)

## 2022-12-09 LAB — TYPE AND SCREEN
ABO/RH(D): O POS
Antibody Screen: NEGATIVE
Unit division: 0
Unit division: 0

## 2022-12-09 LAB — BPAM RBC
Blood Product Expiration Date: 202410202359
Blood Product Expiration Date: 202410262359
ISSUE DATE / TIME: 202409231148
ISSUE DATE / TIME: 202409240128
Unit Type and Rh: 5100
Unit Type and Rh: 5100

## 2022-12-09 LAB — ACTH STIMULATION, 3 TIME POINTS
Cortisol, 30 Min: 23.6 ug/dL
Cortisol, 60 Min: 25 ug/dL
Cortisol, Base: 14.8 ug/dL

## 2022-12-09 LAB — HEMOGLOBIN AND HEMATOCRIT, BLOOD
HCT: 27.6 % — ABNORMAL LOW (ref 36.0–46.0)
HCT: 28.1 % — ABNORMAL LOW (ref 36.0–46.0)
Hemoglobin: 9.2 g/dL — ABNORMAL LOW (ref 12.0–15.0)
Hemoglobin: 9.3 g/dL — ABNORMAL LOW (ref 12.0–15.0)

## 2022-12-09 LAB — GLUCOSE, CAPILLARY
Glucose-Capillary: 46 mg/dL — ABNORMAL LOW (ref 70–99)
Glucose-Capillary: 22 mg/dL — CL (ref 70–99)
Glucose-Capillary: 99 mg/dL (ref 70–99)

## 2022-12-09 MED ORDER — BOOST / RESOURCE BREEZE PO LIQD CUSTOM
1.0000 | Freq: Three times a day (TID) | ORAL | Status: DC
  Administered 2022-12-09 – 2022-12-11 (×8): 1 via ORAL

## 2022-12-09 MED ORDER — DEXTROSE-SODIUM CHLORIDE 5-0.45 % IV SOLN: INTRAVENOUS | Status: DC

## 2022-12-09 MED ORDER — POLYETHYLENE GLYCOL 3350 17 G PO PACK
17.0000 g | PACK | Freq: Every day | ORAL | Status: DC
  Administered 2022-12-09 – 2022-12-11 (×3): 17 g via ORAL
  Filled 2022-12-09 (×3): qty 1

## 2022-12-09 MED ORDER — DEXTROSE 50 % IV SOLN
25.0000 g | INTRAVENOUS | Status: AC
  Administered 2022-12-09: 25 g via INTRAVENOUS
  Filled 2022-12-09: qty 50

## 2022-12-09 NOTE — TOC Initial Note (Signed)
Transition of Care [facility]) - Initial/Assessment Note    Patient Details  Name: Marilyn Haas MRN: [id number] Date of Birth: Aug 08, 1940  [facility] of [facility]) CM/SW Contact:    Margarito Liner, LCSW Phone Number: 12/09/2022, 11:39 AM  Clinical Narrative:   Patient not fully oriented. CSW called son, Marilyn Haas, introduced role, and explained that therapy recommendations would be discussed. He is agreeable to SNF placement. Patient was in a hotel prior to admission because she was traveling with family to her sister's funeral in [place]. Son confirmed she lives in her own home in [place]. He is agreeable to placement in [place] or [place]. No further concerns. CSW encouraged patient's son to contact CSW as needed. CSW will continue to follow patient and her son for support and facilitate discharge to SNF once medically stable.               Expected Discharge Plan: Skilled Nursing Facility Barriers to Discharge: Continued Medical Work up   Patient Goals and CMS Choice            Expected Discharge Plan and Services     Post Acute Care Choice: Skilled Nursing Facility Living arrangements for the past 2 months: Single Family Home                                      Prior Living Arrangements/Services Living arrangements for the past 2 months: Single Family Home Lives with:: Self Patient language and need for interpreter reviewed:: Yes Do you feel safe going back to the place where you live?: Yes      Need for Family Participation in Patient Care: Yes (Comment) Care giver support system in place?: Yes (comment)   Criminal Activity/Legal Involvement Pertinent to Current Situation/Hospitalization: No - Comment as needed  Activities of Daily Living      Permission Sought/Granted Permission sought to share information with : Facility Medical sales representative, Family Supports    Share Information with NAME: Marilyn Haas  Permission granted to share info  w AGENCY: SNF's  Permission granted to share info w Relationship: Son  Permission granted to share info w Contact Information: [phone]  Emotional Assessment Appearance:: Appears stated age Attitude/Demeanor/Rapport: Unable to Assess Affect (typically observed): Unable to Assess Orientation: : Oriented to Self, Oriented to Place Alcohol / Substance Use: Not Applicable Psych Involvement: No (comment)  Admission diagnosis:  Confusion [R41.0] Hyponatremia [E87.1] Normocytic anemia [D64.9] Patient Active Problem List   Diagnosis Date Noted   Duodenal ulcer 12/08/2022   Upper GI bleed 12/08/2022   Hyponatremia 12/07/2022   Hypokalemia 12/07/2022   GERD without esophagitis 12/07/2022   Anxiety and depression 12/07/2022   History of right breast cancer 12/07/2022   Confusion 12/07/2022   Alcohol abuse 12/07/2022   Pressure injury of skin 12/07/2022   Frequent falls 02/14/2022   GI bleed 02/06/2022   Elevated BUN 02/06/2022   GERD (gastroesophageal reflux disease) 07/05/2021   Fall at home, initial encounter 07/04/2021   MDD (major depressive disorder), recurrent episode, moderate (HCC) 06/13/2018   Dysphagia 09/01/2017   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 (HCC) 08/13/2016   Malignant neoplasm of upper-outer quadrant of right breast in female, estrogen receptor positive (HCC) 06/22/2016   Osteopenia determined by x-ray 06/22/2016   Chronic hyponatremia 10/12/2015   PVC's (premature ventricular contractions) 12/16/2014   Acute on chronic blood loss anemia 08/09/2012  B12 deficiency anemia 08/09/2012   Vitamin D deficiency 08/09/2012   Anxiety 08/09/2012   Hyperlipidemia 05/25/2011   Essential hypertension 05/25/2011   PCP:  Elfredia Nevins, MD Pharmacy:   [facility] [phone] - [place], [place] - [address] AT [facility] [address] [place] [address] Phone: [phone] Fax: [phone]  [facility]  - [place], [place] - [address]  [place] [address] Phone: [phone] Fax: [phone]     Social Determinants of Health (SDOH) Social History: SDOH Screenings   Food Insecurity: No Food Insecurity (09/25/2022)  Housing: Low Risk  (09/25/2022)  Transportation Needs: No Transportation Needs (10/26/2022)  Utilities: Not At Risk (03/23/2022)  Alcohol Screen: Low Risk  (03/23/2022)  Depression (PHQ2-9): Medium Risk (03/23/2022)  Financial Resource Strain: Low Risk  (10/26/2022)  Physical Activity: Inactive (03/23/2022)  Social Connections: Socially Isolated (03/23/2022)  Stress: No Stress Concern Present (03/23/2022)  Tobacco Use: Medium Risk (12/07/2022)   SDOH Interventions:     Readmission Risk Interventions    12/07/2022   10:54 AM  Readmission Risk Prevention Plan  Transportation Screening Complete  PCP or Specialist Appt within 3-5 Days Complete  HRI or Home Care Consult Complete  Social Work Consult for Recovery Care Planning/Counseling Complete  Palliative Care Screening Not Applicable  Medication Review Oceanographer) Not Complete  Med Review Comments Will be reviewed by charge nurse upon discharge

## 2022-12-09 NOTE — Progress Notes (Signed)
Occupational Therapy Treatment Patient Details Name: Marilyn Haas MRN: [id number] DOB: 1940-09-02 Today's Date: 12/09/2022   History of present illness Pt is an 82 yo female that presented for AMS, possible seizure, s/p transfusion this admission. Past medical history significant for daily alcohol use, depression on Abilify, chronic pain on chronic opiates, intermittent diarrhea, history of GI bleed and anemia, breast cancer on tamoxifen s/p surgery in 2018 and a lumpectomy in late 2022 with some atypia, very hard of hearing at baseline.   OT comments  Marilyn Haas was seen for OT treatment on this date. Oriented to self, location this date. Upon arrival to room pt reclined in bed, agreeable to tx. Pt requires MIN A + HHA sit<>stand x3 trials, tolerates standing marching ~2 mins. CGA hair brushing in standing, tolerates ~5 min. Pt making good progress toward goals, will continue to follow POC. Discharge recommendation remains appropriate.        If plan is discharge home, recommend the following:  A little help with walking and/or transfers;A little help with bathing/dressing/bathroom;Direct supervision/assist for medications management;Help with stairs or ramp for entrance   Equipment Recommendations  BSC/3in1    Recommendations for Other Services      Precautions / Restrictions Precautions Precautions: Fall Restrictions Weight Bearing Restrictions: No       Mobility Bed Mobility Overal bed mobility: Needs Assistance Bed Mobility: Supine to Sit, Sit to Supine     Supine to sit: Min assist Sit to supine: Contact guard assist        Transfers Overall transfer level: Needs assistance Equipment used: Rolling walker (2 wheels) Transfers: Sit to/from Stand Sit to Stand: Min assist           General transfer comment: x3 stands and marching     Balance Overall balance assessment: Needs assistance Sitting-balance support: No upper extremity supported, Feet  supported Sitting balance-Leahy Scale: Good     Standing balance support: During functional activity, Bilateral upper extremity supported Standing balance-Leahy Scale: Fair                             ADL either performed or assessed with clinical judgement   ADL Overall ADL's : Needs assistance/impaired                                       General ADL Comments: MIN A + HHA for simulated BSC t/f. CGA hair brushing in standing, tolerates ~5 min standing      Cognition Arousal: Alert Behavior During Therapy: WFL for tasks assessed/performed Overall Cognitive Status: Impaired/Different from baseline Area of Impairment: Following commands, Problem solving, Safety/judgement                       Following Commands: Follows one step commands with increased time, Follows one step commands consistently Safety/Judgement: Decreased awareness of deficits, Decreased awareness of safety   Problem Solving: Slow processing, Decreased initiation                     Pertinent Vitals/ Pain       Pain Assessment Pain Assessment: No/denies pain   Frequency  Min 1X/week        Progress Toward Goals  OT Goals(current goals can now be found in the care plan section)  Progress towards OT goals: Progressing toward goals  Acute Rehab OT Goals Patient Stated Goal: to get stronger OT Goal Formulation: With patient Time For Goal Achievement: 12/22/22 Potential to Achieve Goals: Good ADL Goals Pt Will Perform Grooming: with modified independence;standing Pt Will Perform Lower Body Dressing: with modified independence;sit to/from stand Pt Will Transfer to Toilet: with supervision;ambulating;regular height toilet Pt Will Perform Toileting - Clothing Manipulation and hygiene: with modified independence;sitting/lateral leans;sit to/from stand  Plan      Co-evaluation                 AM-PAC OT "6 Clicks" Daily Activity     Outcome  Measure   Help from another person eating meals?: None Help from another person taking care of personal grooming?: A Little Help from another person toileting, which includes using toliet, bedpan, or urinal?: A Little Help from another person bathing (including washing, rinsing, drying)?: A Little Help from another person to put on and taking off regular upper body clothing?: A Little Help from another person to put on and taking off regular lower body clothing?: A Little 6 Click Score: 19    End of Session    OT Visit Diagnosis: Other abnormalities of gait and mobility (R26.89);Muscle weakness (generalized) (M62.81)   Activity Tolerance Patient tolerated treatment well   Patient Left in bed;with call bell/phone within reach;with bed alarm set;with family/visitor present   Nurse Communication Mobility status        Time: 1610-9604 OT Time Calculation (min): 27 min  Charges: OT General Charges $OT Visit: 1 Visit OT Treatments $Self Care/Home Management : 23-37 mins  Kathie Dike, M.S. OTR/L  12/09/22, 2:36 PM  ascom [phone]

## 2022-12-09 NOTE — TOC Progression Note (Signed)
Transition of Care [facility]) - Progression Note    Patient Details  Name: Marilyn Haas MRN: [id number] Date of Birth: Mar 14, 1941  [facility]) CM/SW Contact  Chapman Fitch, RN Phone Number: 12/09/2022, 4:02 PM  Clinical Narrative:     Notified that son Marden Noble at bedside and requesting that I speak with him.  Per MD patient not fully oriented.   Spoke with son Marden Noble via phone.  He had left the hospital.  He states that patient lives with him and his family.   Marden Noble states that he is also in agreement for SNF and would like to accept bed at [facility]. Son Genevie Cheshire updated and he is in agreement   Accepted in HUB  Notified Whitney with [facility] is off)   Expected Discharge Plan: Skilled Nursing Facility Barriers to Discharge: Continued Medical Work up  Expected Discharge Plan and Services     Post Acute Care Choice: Skilled Nursing Facility Living arrangements for the past 2 months: Single Family Home                                       Social Determinants of Health (SDOH) Interventions SDOH Screenings   Food Insecurity: No Food Insecurity (09/25/2022)  Housing: Low Risk  (09/25/2022)  Transportation Needs: No Transportation Needs (10/26/2022)  Utilities: Not At Risk (03/23/2022)  Alcohol Screen: Low Risk  (03/23/2022)  Depression (PHQ2-9): Medium Risk (03/23/2022)  Financial Resource Strain: Low Risk  (10/26/2022)  Physical Activity: Inactive (03/23/2022)  Social Connections: Socially Isolated (03/23/2022)  Stress: No Stress Concern Present (03/23/2022)  Tobacco Use: Medium Risk (12/07/2022)    Readmission Risk Interventions    12/07/2022   10:54 AM  Readmission Risk Prevention Plan  Transportation Screening Complete  PCP or Specialist Appt within 3-5 Days Complete  HRI or Home Care Consult Complete  Social Work Consult for Recovery Care Planning/Counseling Complete  Palliative Care Screening Not Applicable  Medication Review Oceanographer) Not  Complete  Med Review Comments Will be reviewed by charge nurse upon discharge

## 2022-12-09 NOTE — Progress Notes (Signed)
PROGRESS NOTE    Marilyn PEDLEY  Haas:[id number] DOB: 1940-05-14 DOA: 12/07/2022 PCP: Elfredia Nevins, MD     Brief Narrative:   Marilyn Haas is a 82 y.o. Caucasian female with medical history significant for anxiety, osteoarthritis, GERD, depression, diverticulosis, chronic back pain, hypertension and osteoporosis, who presented to the emergency room with acute onset of altered mental status with confusion.  She has been less responsive however denied any loss of consciousness.  No fever or chills.  No nausea or vomiting or abdominal pain.  Her last alcoholic drink was a week ago.  She drinks 1-2 beers per day per her report.  No paresthesias or focal muscle weakness.  She denied any dysuria, oliguria or hematuria or flank pain.  Per her son she gets tense and stiff and was mumbling but speaking some.  No chest pain or palpitations.  No cough or wheezing or hemoptysis.  No nausea or vomiting or abdominal pain.    Assessment & Plan:   Principal Problem:   Hyponatremia Active Problems:   Hypokalemia   Alcohol abuse   Essential hypertension   Acute on chronic blood loss anemia   Malignant neoplasm of upper-outer quadrant of right breast in female, estrogen receptor positive (HCC)   GERD without esophagitis   Anxiety and depression   History of right breast cancer   Confusion   Pressure injury of skin   Duodenal ulcer   Upper GI bleed  # Acute encephalopathy Hyponatremia, anemia most likely contributors currently. Etoh may also contribute, as could home xanax. EEG negative, MRI nothing acute. Neurology consulted, has signed off. Mental status improving. - continue high-dose thiamine - f/u blood cultures, ngtd  # Hyponatremia, hypoosmolar Baseline hyponatremia here acute on chronic with low urine sodium and elevated urine osm suggestive of hypovolemia though also on meds that can cause siadh. Tsh wnl. Improving with fluids, 121>128>129. Acth stim test neg. - continue fluids  today until cleared for regular diet - decreased duloxetine from 120 to 60  # Anemia # Upper GI bleed # Duodenal ulcer No report of melena or hematochezia. Hospitalized 02/2022 with upper gi bleed, egd with Sheria Lang ulcers. Hgb 7.9 on arrival, 6.0 on repeat. No overt bleeding here. Has received 2 units thus far. Hgb stable now in the 9s, no report of bleeding overnight. Duodenal ulcer seen on 9/24 EGD with visible vessel that began bleeding when injected, treated with 3 hemostatic clips and hemostatic spray. - continue IV gtt - IR consult if further bleeding - GI following, can likely resume solids tonight if remains stable  # Debility PT advising snf - TOC pursuing SNF  # Hiatal hernia - outpt gen surg f/u  # Alcohol abuse No current signs withdrawal - ciwa protocol, vitamins  # History breast cancer - cont home tamoxifen  # GAD -cont home xanax, abilify, duloxetine; have decreased duloxetine dose from 120 to 60  # HTN Bps slightly elevated today, was placed on midodrine yesterday for hypotension - home losartan on hold - d/c midodrine     DVT prophylaxis: SCDs Code Status: full Family Communication: son billy updated telephonically 9/24, no answer when called today - left voicemail  Level of care: Telemetry Medical Status is: Inpatient Remains inpatient appropriate because: severity of illness    Consultants:  Neuro (signed off) GI  Procedures: EGD 9/24  Antimicrobials:  none    Subjective: No complaints, no bleeding  Objective: Vitals:   12/08/22 1714 12/08/22 1925 12/09/22 0315 12/09/22 9562  BP: 113/61 117/71 104/73 (!) 140/92  Pulse: (!) 107 84 83 94  Resp: 18 14 20 18   Temp:  97.9 F (36.6 C) 97.9 F (36.6 C) 97.9 F (36.6 C)  TempSrc:  Oral Oral   SpO2: 95% 100% 98% 96%  Weight:      Height:        Intake/Output Summary (Last 24 hours) at 12/09/2022 1110 Last data filed at 12/09/2022 0315 Gross per 24 hour  Intake 1545.52 ml  Output  --  Net 1545.52 ml   Filed Weights   12/07/22 0027  Weight: 55.8 kg    Examination:  General exam: Appears calm and comfortable  Respiratory system: Clear to auscultation. Respiratory effort normal. Cardiovascular system: S1 & S2 heard, RRR. Soft systolic murmur Gastrointestinal system: Abdomen is nondistended, soft and nontender. No organomegaly or masses felt. Normal bowel sounds heard. Central nervous system: Alert and oriented to self, moving all 4, symmetric, no facial droop Extremities: Symmetric 5 x 5 power. Skin: No rashes, lesions or ulcers Psychiatry: pleasantly confused    Data Reviewed: I have personally reviewed following labs and imaging studies  CBC: Recent Labs  Lab 12/07/22 0037 12/07/22 0521 12/07/22 0856 12/07/22 1734 12/07/22 2244 12/08/22 0605 12/08/22 1746 12/09/22 0107 12/09/22 0906  WBC 13.0* 11.3*  --  6.9  --  6.6  --   --   --   NEUTROABS 10.2*  --   --   --   --   --   --   --   --   HGB 7.9* 6.0*   < > 8.4* 7.3* 9.1* 9.4* 9.2* 9.3*  HCT 22.7* 16.7*  --  24.1* 21.0* 25.5* 27.1* 27.6* 28.1*  MCV 95.8 94.4  --  89.3  --  90.1  --   --   --   PLT 250 170  --  151  --  144*  --   --   --    < > = values in this interval not displayed.   Basic Metabolic Panel: Recent Labs  Lab 12/07/22 0037 12/07/22 0521 12/07/22 1144 12/07/22 1734 12/08/22 0605 12/09/22 0906  NA 121* 122* 123* 125* 128* 129*  K 3.4* 3.6 3.7 3.5 4.2 4.4  CL 89* 95* 95* 97* 104 108  CO2 22 22 22  21* 19* 17*  GLUCOSE 181* 97 101* 103* 90 88  BUN 26* 24* 24* 24* 23 18  CREATININE 0.81 0.64 0.66 0.74 0.70 0.78  CALCIUM 8.0* 7.5* 7.5* 7.6* 7.4* 7.7*  MG 1.9  --   --   --   --   --    GFR: Estimated Creatinine Clearance: 40.2 mL/min (by C-G formula based on SCr of 0.78 mg/dL). Liver Function Tests: Recent Labs  Lab 12/07/22 0037  AST 89*  ALT 47*  ALKPHOS 79  BILITOT 1.0  PROT 5.2*  ALBUMIN 2.6*   Recent Labs  Lab 12/07/22 0037  LIPASE 26   Recent Labs   Lab 12/07/22 0620  AMMONIA <10   Coagulation Profile: Recent Labs  Lab 12/07/22 0037  INR 1.1   Cardiac Enzymes: Recent Labs  Lab 12/07/22 0037  CKTOTAL 40   BNP (last 3 results) No results for input(s): "PROBNP" in the last 8760 hours. HbA1C: No results for input(s): "HGBA1C" in the last 72 hours. CBG: Recent Labs  Lab 12/07/22 0518 12/08/22 0555 12/09/22 0504 12/09/22 0530 12/09/22 0535  GLUCAP 97 84 46* 22* 99   Lipid Profile: No results for input(s): "CHOL", "  HDL", "LDLCALC", "TRIG", "CHOLHDL", "LDLDIRECT" in the last 72 hours. Thyroid Function Tests: Recent Labs    12/07/22 0037  TSH 3.054   Anemia Panel: No results for input(s): "VITAMINB12", "FOLATE", "FERRITIN", "TIBC", "IRON", "RETICCTPCT" in the last 72 hours. Urine analysis:    Component Value Date/Time   COLORURINE YELLOW (A) 12/06/2022 0037   APPEARANCEUR HAZY (A) 12/06/2022 0037   LABSPEC 1.019 12/06/2022 0037   PHURINE 5.0 12/06/2022 0037   GLUCOSEU NEGATIVE 12/06/2022 0037   HGBUR NEGATIVE 12/06/2022 0037   BILIRUBINUR NEGATIVE 12/06/2022 0037   BILIRUBINUR negative 10/26/2022 0841   KETONESUR NEGATIVE 12/06/2022 0037   PROTEINUR NEGATIVE 12/06/2022 0037   UROBILINOGEN 0.2 10/26/2022 0841   UROBILINOGEN 1.0 06/26/2014 1400   NITRITE NEGATIVE 12/06/2022 0037   LEUKOCYTESUR NEGATIVE 12/06/2022 0037   Sepsis Labs: @LABRCNTIP (procalcitonin:4,lacticidven:4)  ) Recent Results (from the past 240 hour(s))  Culture, blood (Routine X 2) w Reflex to ID Panel     Status: None (Preliminary result)   Collection Time: 12/07/22  9:20 AM   Specimen: BLOOD LEFT HAND  Result Value Ref Range Status   Specimen Description BLOOD LEFT HAND  Final   Special Requests   Final    BOTTLES DRAWN AEROBIC AND ANAEROBIC Blood Culture adequate volume   Culture   Final    NO GROWTH 2 DAYS Performed at [facility], [address] [place], [place] [address]    Report Status PENDING  Incomplete   Culture, blood (Routine X 2) w Reflex to ID Panel     Status: None (Preliminary result)   Collection Time: 12/07/22  9:29 AM   Specimen: BLOOD LEFT ARM  Result Value Ref Range Status   Specimen Description BLOOD LEFT ARM  Final   Special Requests   Final    BOTTLES DRAWN AEROBIC AND ANAEROBIC Blood Culture adequate volume   Culture   Final    NO GROWTH 2 DAYS Performed at [facility], [address] [place], [place] [address]    Report Status PENDING  Incomplete         Radiology Studies: US Abdomen Limited RUQ (LIVER/GB)  Result Date: 12/08/2022 CLINICAL DATA:  Elevated LFTs EXAM: ULTRASOUND ABDOMEN LIMITED RIGHT UPPER QUADRANT COMPARISON:  None Available. FINDINGS: Gallbladder: There is layering sludge and small stones in the gallbladder with stones measuring up to 5 mm. There is no gallbladder wall thickening or pericholecystic fluid. Sonographic Murphy's sign was reported negative. Common bile duct: Diameter: 3 mm Liver: Parenchymal echogenicity is diffusely increased. There are no focal lesions. There is no intrahepatic biliary ductal dilatation. Portal vein is patent on color Doppler imaging with normal direction of blood flow towards the liver. Other: There is no free fluid. IMPRESSION: 1. Cholelithiasis without evidence of acute cholecystitis. 2. Hepatic steatosis. Electronically Signed   By: Lesia Hausen M.D.   On: 12/08/2022 13:01   ECHOCARDIOGRAM COMPLETE  Result Date: 12/08/2022    ECHOCARDIOGRAM REPORT   Patient Name:   LEVEAH RATTERMAN Date of Exam: 12/07/2022 Medical Rec #:  [id number]         Height:       61.0 in Accession #:    [id number]        Weight:       123.0 lb Date of Birth:  [birth year]        BSA:          1.536 m Patient Age:    28 years  BP:           124/78 mmHg Patient Gender: F                 HR:           87 bpm. Exam Location:  [facility] Procedure: 2D Echo, Cardiac Doppler and Color Doppler Indications:     R55 Syncope  History:          Patient has prior history of Echocardiogram examinations, most                  recent 02/18/2016.  Sonographer:     Daphine Deutscher RDCS Referring Phys:  [id number] Vernetta Honey MANSY Diagnosing Phys: Yvonne Kendall MD IMPRESSIONS  1. Left ventricular ejection fraction, by estimation, is >55%. The left ventricle has normal function. Left ventricular endocardial border not optimally defined to evaluate regional wall motion. Left ventricular diastolic parameters are consistent with Grade I diastolic dysfunction (impaired relaxation).  2. Right ventricular systolic function is mildly reduced. The right ventricular size is normal.  3. The mitral valve is grossly normal. No evidence of mitral valve regurgitation. No evidence of mitral stenosis.  4. The aortic valve was not well visualized. Aortic valve regurgitation is mild. No aortic stenosis is present. FINDINGS  Left Ventricle: Left ventricular ejection fraction, by estimation, is >55%. The left ventricle has normal function. Left ventricular endocardial border not optimally defined to evaluate regional wall motion. The left ventricular internal cavity size was  normal in size. There is no left ventricular hypertrophy. Left ventricular diastolic parameters are consistent with Grade I diastolic dysfunction (impaired relaxation). Right Ventricle: Pulmonary artery pressure is normal (RVSP 15-20 mmHg plus central venous/right atrial pressure). The right ventricular size is normal. Right vetricular wall thickness was not well visualized. Right ventricular systolic function is mildly  reduced. Left Atrium: Left atrial size was normal in size. Right Atrium: Right atrial size was normal in size. Pericardium: The pericardium was not well visualized. Presence of epicardial fat layer. Mitral Valve: The mitral valve is grossly normal. No evidence of mitral valve regurgitation. No evidence of mitral valve stenosis. Tricuspid Valve: The tricuspid valve is grossly normal. Tricuspid  valve regurgitation is trivial. Aortic Valve: The aortic valve was not well visualized. Aortic valve regurgitation is mild. No aortic stenosis is present. Aortic valve mean gradient measures 3.8 mmHg. Aortic valve peak gradient measures 5.1 mmHg. Aortic valve area, by VTI measures 2.57  cm. Pulmonic Valve: The pulmonic valve was not well visualized. Pulmonic valve regurgitation is not visualized. No evidence of pulmonic stenosis. Aorta: The aortic root is normal in size and structure. Pulmonary Artery: The pulmonary artery is not well seen. Venous: The inferior vena cava was not well visualized. IAS/Shunts: The interatrial septum was not well visualized.  LEFT VENTRICLE PLAX 2D LVIDd:         3.00 cm   Diastology LVIDs:         2.00 cm   LV e' medial:    5.62 cm/s LV PW:         0.60 cm   LV E/e' medial:  13.6 LV IVS:        0.60 cm   LV e' lateral:   6.02 cm/s LVOT diam:     2.00 cm   LV E/e' lateral: 12.7 LV SV:         71 LV SV Index:   46 LVOT Area:     3.14 cm  RIGHT VENTRICLE RV Basal  diam:  2.90 cm RV S prime:     9.83 cm/s TAPSE (M-mode): 1.7 cm LEFT ATRIUM             Index        RIGHT ATRIUM           Index LA diam:        3.00 cm 1.95 cm/m   RA Area:     10.70 cm LA Vol (A2C):   19.6 ml 12.76 ml/m  RA Volume:   24.90 ml  16.21 ml/m LA Vol (A4C):   26.8 ml 17.45 ml/m LA Biplane Vol: 24.1 ml 15.69 ml/m  AORTIC VALVE AV Area (Vmax):    2.82 cm AV Area (Vmean):   2.31 cm AV Area (VTI):     2.57 cm AV Vmax:           113.44 cm/s AV Vmean:          94.170 cm/s AV VTI:            0.275 m AV Peak Grad:      5.1 mmHg AV Mean Grad:      3.8 mmHg LVOT Vmax:         101.73 cm/s LVOT Vmean:        69.133 cm/s LVOT VTI:          0.225 m LVOT/AV VTI ratio: 0.82  AORTA Ao Root diam: 3.10 cm MITRAL VALVE               TRICUSPID VALVE MV Area (PHT): 3.37 cm    TR Peak grad:   18.3 mmHg MV Decel Time: 225 msec    TR Vmax:        214.00 cm/s MV E velocity: 76.25 cm/s MV A velocity: 85.90 cm/s  SHUNTS MV E/A  ratio:  0.89        Systemic VTI:  0.23 m                            Systemic Diam: 2.00 cm Yvonne Kendall MD Electronically signed by Yvonne Kendall MD Signature Date/Time: 12/08/2022/9:59:20 AM    Final    EEG adult  Result Date: 12/07/2022 Charlsie Quest, MD     12/07/2022  5:13 PM Patient Name: EUNICE ZERN MRN: [id number] Epilepsy Attending: Charlsie Quest Referring Physician/Provider: Hannah Beat, MD Date: 12/07/2022 Duration: 27.31 mins Patient history: 82yo F with ams getting eeg to evaluate for seizure Level of alertness: Awake AEDs during EEG study: None Technical aspects: This EEG study was done with scalp electrodes positioned according to the 10-20 International system of electrode placement. Electrical activity was reviewed with band pass filter of 1-70Hz , sensitivity of 7 uV/mm, display speed of 47mm/sec with a 60Hz  notched filter applied as appropriate. EEG data were recorded continuously and digitally stored.  Video monitoring was available and reviewed as appropriate. Description: The posterior dominant rhythm consists of 7 Hz activity of moderate voltage (25-35 uV) seen predominantly in posterior head regions, symmetric and reactive to eye opening and eye closing. EEG showed continuous generalized 6 to 7 Hz theta slowing. Hyperventilation and photic stimulation were not performed.   ABNORMALITY - Continuous slow, generalized IMPRESSION: This study is suggestive of mild diffuse encephalopathy. No seizures or epileptiform discharges were seen throughout the recording. Priyanka Annabelle Harman        Scheduled Meds:  sodium chloride   Intravenous Once   sodium chloride  Intravenous Once   ARIPiprazole  2 mg Oral Daily   cyanocobalamin  1,000 mcg Oral Daily   DULoxetine  60 mg Oral Daily   feeding supplement  1 Container Oral TID BM   folic acid  1 mg Oral Daily   Gerhardt's butt cream   Topical TID   hydrocerin   Topical BID   leptospermum manuka honey  1 Application Topical  Daily   midodrine  10 mg Oral TID WC   multivitamin with minerals  1 tablet Oral Daily   [START ON 12/12/2022] pantoprazole  40 mg Intravenous Q12H   sodium chloride flush  3 mL Intravenous Q12H   tamoxifen  10 mg Oral QODAY   [START ON 12/10/2022] thiamine (VITAMIN B1) injection  100 mg Intravenous Daily   Continuous Infusions:  dextrose 5 % and 0.45 % NaCl 100 mL/hr at 12/09/22 0957   pantoprazole 8 mg/hr (12/09/22 0315)   thiamine (VITAMIN B1) injection 500 mg (12/09/22 0502)     LOS: 2 days     Silvano Bilis, MD [facility]   If 7PM-7AM, please contact night-coverage [email or web address] Password [email or web address] 12/09/2022, 11:10 AM

## 2022-12-09 NOTE — Progress Notes (Signed)
Spoke to Dr. Arville Care at [phone] on Epic chat. Patient had an episode of hypoglycemia this morning of 46. The hypoglycemia protocol was started and an amp of D50 was given IV. The care tech came back in 15 minutes later for a recheck, the recheck was 22. Seeing that, that number is unlikely given that patient received full amp of D50, I grabbed a different glucometer and checked it myself. My recheck was 99. Possible suspicion that the glucometer the tech used might be defective, as she used it on other patients which also came up with low levels. I could not add a comment to the 22 glucose level to disregard it in error so I just informed Dr. Arville Care about the error, MD recommends continue to monitor.

## 2022-12-09 NOTE — Progress Notes (Signed)
Patient in Hospital will call when discharged home.  [facility] [place]  Care Coordination Care Guide  Direct Dial: [phone]

## 2022-12-09 NOTE — Progress Notes (Signed)
Marilyn Repress, MD [address]  [address]  [place], [place] [address]  Main: [phone]  Fax: [phone] Pager: [phone]   Subjective: No acute events overnight, she has been tolerating p.o. liquids well   Objective: Vital signs in last 24 hours: Vitals:   12/08/22 1714 12/08/22 1925 12/09/22 0315 12/09/22 0814  BP: 113/61 117/71 104/73 (!) 140/92  Pulse: (!) 107 84 83 94  Resp: 18 14 20 18   Temp:  97.9 F (36.6 C) 97.9 F (36.6 C) 97.9 F (36.6 C)  TempSrc:  Oral Oral   SpO2: 95% 100% 98% 96%  Weight:      Height:       Weight change:   Intake/Output Summary (Last 24 hours) at 12/09/2022 1417 Last data filed at 12/09/2022 1300 Gross per 24 hour  Intake 1605.52 ml  Output --  Net 1605.52 ml     Exam: Heart: Regular rate and rhythm, S1S2 present, or without murmur or extra heart sounds Lungs: normal and clear to auscultation Abdomen: soft, nontender, normal bowel sounds   Lab Results:    Latest Ref Rng & Units 12/09/2022    9:06 AM 12/09/2022    1:07 AM 12/08/2022    5:46 PM  CBC  Hemoglobin 12.0 - 15.0 g/dL 9.3  9.2  9.4   Hematocrit 36.0 - 46.0 % 28.1  27.6  27.1       Latest Ref Rng & Units 12/09/2022    9:06 AM 12/08/2022    6:05 AM 12/07/2022    5:34 PM  CMP  Glucose 70 - 99 mg/dL 88  90  474   BUN 8 - 23 mg/dL 18  23  24    Creatinine 0.44 - 1.00 mg/dL 2.59  5.63  8.75   Sodium 135 - 145 mmol/L 129  128  125   Potassium 3.5 - 5.1 mmol/L 4.4  4.2  3.5   Chloride 98 - 111 mmol/L 108  104  97   CO2 22 - 32 mmol/L 17  19  21    Calcium 8.9 - 10.3 mg/dL 7.7  7.4  7.6     Micro Results: Recent Results (from the past 240 hour(s))  Culture, blood (Routine X 2) w Reflex to ID Panel     Status: None (Preliminary result)   Collection Time: 12/07/22  9:20 AM   Specimen: BLOOD LEFT HAND  Result Value Ref Range Status   Specimen Description BLOOD LEFT HAND  Final   Special Requests   Final    BOTTLES DRAWN AEROBIC AND ANAEROBIC Blood  Culture adequate volume   Culture   Final    NO GROWTH 2 DAYS Performed at [facility], [address] [place], [place] [address]    Report Status PENDING  Incomplete  Culture, blood (Routine X 2) w Reflex to ID Panel     Status: None (Preliminary result)   Collection Time: 12/07/22  9:29 AM   Specimen: BLOOD LEFT ARM  Result Value Ref Range Status   Specimen Description BLOOD LEFT ARM  Final   Special Requests   Final    BOTTLES DRAWN AEROBIC AND ANAEROBIC Blood Culture adequate volume   Culture   Final    NO GROWTH 2 DAYS Performed at [facility], [address] [place], [place] [address]    Report Status PENDING  Incomplete   Studies/Results: US Abdomen Limited RUQ (LIVER/GB)  Result Date: 12/08/2022 CLINICAL DATA:  Elevated LFTs EXAM: ULTRASOUND ABDOMEN LIMITED  RIGHT UPPER QUADRANT COMPARISON:  None Available. FINDINGS: Gallbladder: There is layering sludge and small stones in the gallbladder with stones measuring up to 5 mm. There is no gallbladder wall thickening or pericholecystic fluid. Sonographic Murphy's sign was reported negative. Common bile duct: Diameter: 3 mm Liver: Parenchymal echogenicity is diffusely increased. There are no focal lesions. There is no intrahepatic biliary ductal dilatation. Portal vein is patent on color Doppler imaging with normal direction of blood flow towards the liver. Other: There is no free fluid. IMPRESSION: 1. Cholelithiasis without evidence of acute cholecystitis. 2. Hepatic steatosis. Electronically Signed   By: Lesia Hausen M.D.   On: 12/08/2022 13:01   ECHOCARDIOGRAM COMPLETE  Result Date: 12/08/2022    ECHOCARDIOGRAM REPORT   Patient Name:   Marilyn Haas Date of Exam: 12/07/2022 Medical Rec #:  [id number]         Height:       61.0 in Accession #:    [id number]        Weight:       123.0 lb Date of Birth:  1940-06-10        BSA:          1.536 m Patient Age:    82 years          BP:           124/78 mmHg Patient  Gender: F                 HR:           87 bpm. Exam Location:  [facility] Procedure: 2D Echo, Cardiac Doppler and Color Doppler Indications:     R55 Syncope  History:         Patient has prior history of Echocardiogram examinations, most                  recent 02/18/2016.  Sonographer:     Daphine Deutscher RDCS Referring Phys:  [id number] Vernetta Honey MANSY Diagnosing Phys: Yvonne Kendall MD IMPRESSIONS  1. Left ventricular ejection fraction, by estimation, is >55%. The left ventricle has normal function. Left ventricular endocardial border not optimally defined to evaluate regional wall motion. Left ventricular diastolic parameters are consistent with Grade I diastolic dysfunction (impaired relaxation).  2. Right ventricular systolic function is mildly reduced. The right ventricular size is normal.  3. The mitral valve is grossly normal. No evidence of mitral valve regurgitation. No evidence of mitral stenosis.  4. The aortic valve was not well visualized. Aortic valve regurgitation is mild. No aortic stenosis is present. FINDINGS  Left Ventricle: Left ventricular ejection fraction, by estimation, is >55%. The left ventricle has normal function. Left ventricular endocardial border not optimally defined to evaluate regional wall motion. The left ventricular internal cavity size was  normal in size. There is no left ventricular hypertrophy. Left ventricular diastolic parameters are consistent with Grade I diastolic dysfunction (impaired relaxation). Right Ventricle: Pulmonary artery pressure is normal (RVSP 15-20 mmHg plus central venous/right atrial pressure). The right ventricular size is normal. Right vetricular wall thickness was not well visualized. Right ventricular systolic function is mildly  reduced. Left Atrium: Left atrial size was normal in size. Right Atrium: Right atrial size was normal in size. Pericardium: The pericardium was not well visualized. Presence of epicardial fat layer. Mitral Valve: The mitral  valve is grossly normal. No evidence of mitral valve regurgitation. No evidence of mitral valve stenosis. Tricuspid Valve: The tricuspid valve is grossly normal. Tricuspid valve regurgitation  is trivial. Aortic Valve: The aortic valve was not well visualized. Aortic valve regurgitation is mild. No aortic stenosis is present. Aortic valve mean gradient measures 3.8 mmHg. Aortic valve peak gradient measures 5.1 mmHg. Aortic valve area, by VTI measures 2.57  cm. Pulmonic Valve: The pulmonic valve was not well visualized. Pulmonic valve regurgitation is not visualized. No evidence of pulmonic stenosis. Aorta: The aortic root is normal in size and structure. Pulmonary Artery: The pulmonary artery is not well seen. Venous: The inferior vena cava was not well visualized. IAS/Shunts: The interatrial septum was not well visualized.  LEFT VENTRICLE PLAX 2D LVIDd:         3.00 cm   Diastology LVIDs:         2.00 cm   LV e' medial:    5.62 cm/s LV PW:         0.60 cm   LV E/e' medial:  13.6 LV IVS:        0.60 cm   LV e' lateral:   6.02 cm/s LVOT diam:     2.00 cm   LV E/e' lateral: 12.7 LV SV:         71 LV SV Index:   46 LVOT Area:     3.14 cm  RIGHT VENTRICLE RV Basal diam:  2.90 cm RV S prime:     9.83 cm/s TAPSE (M-mode): 1.7 cm LEFT ATRIUM             Index        RIGHT ATRIUM           Index LA diam:        3.00 cm 1.95 cm/m   RA Area:     10.70 cm LA Vol (A2C):   19.6 ml 12.76 ml/m  RA Volume:   24.90 ml  16.21 ml/m LA Vol (A4C):   26.8 ml 17.45 ml/m LA Biplane Vol: 24.1 ml 15.69 ml/m  AORTIC VALVE AV Area (Vmax):    2.82 cm AV Area (Vmean):   2.31 cm AV Area (VTI):     2.57 cm AV Vmax:           113.44 cm/s AV Vmean:          94.170 cm/s AV VTI:            0.275 m AV Peak Grad:      5.1 mmHg AV Mean Grad:      3.8 mmHg LVOT Vmax:         101.73 cm/s LVOT Vmean:        69.133 cm/s LVOT VTI:          0.225 m LVOT/AV VTI ratio: 0.82  AORTA Ao Root diam: 3.10 cm MITRAL VALVE               TRICUSPID VALVE MV Area  (PHT): 3.37 cm    TR Peak grad:   18.3 mmHg MV Decel Time: 225 msec    TR Vmax:        214.00 cm/s MV E velocity: 76.25 cm/s MV A velocity: 85.90 cm/s  SHUNTS MV E/A ratio:  0.89        Systemic VTI:  0.23 m                            Systemic Diam: 2.00 cm Yvonne Kendall MD Electronically signed by Yvonne Kendall MD Signature Date/Time: 12/08/2022/9:59:20 AM    Final  Medications: I have reviewed the patient's current medications. Prior to Admission:  Medications Prior to Admission  Medication Sig Dispense Refill Last Dose   acetaminophen (TYLENOL) 325 MG tablet Take 650 mg by mouth every 6 (six) hours as needed.   prn at prn   ALPRAZolam (XANAX) 0.25 MG tablet Take 0.25 mg by mouth at bedtime as needed for anxiety.   prn at prn   ARIPiprazole (ABILIFY) 2 MG tablet Take 1 tablet (2 mg total) by mouth daily. 30 tablet 0 Past Month   cyanocobalamin 1000 MCG tablet Take 1 tablet (1,000 mcg total) by mouth daily.   12/07/2022   DULoxetine (CYMBALTA) 60 MG capsule Take 2 capsules (120 mg total) by mouth daily. 60 capsule 0 Past Month   losartan (COZAAR) 25 MG tablet TAKE ONE TABLET BY MOUTH TWICE A DAY 60 tablet 0 unk at unk   tamoxifen (NOLVADEX) 10 MG tablet TAKE ONE TABLET (10MG  TOTAL) BY MOUTH EVERY OTHER DAY 30 tablet 3 unk at unk   thiamine (VITAMIN B-1) 100 MG tablet Take 1 tablet (100 mg total) by mouth daily.      zolpidem (AMBIEN) 5 MG tablet Take 1 tablet (5 mg total) by mouth at bedtime. 30 tablet 0    folic acid (FOLVITE) 1 MG tablet Take 1 tablet (1 mg total) by mouth daily. (Patient not taking: Reported on 12/07/2022) 30 tablet 0 Not Taking   loperamide (IMODIUM) 2 MG capsule Take 1 capsule (2 mg total) by mouth 4 (four) times daily as needed for diarrhea or loose stools. 12 capsule 0 prn at prn   pantoprazole (PROTONIX) 40 MG tablet Take 1 tablet (40 mg total) by mouth 2 (two) times daily. (Patient not taking: Reported on 12/07/2022) 60 tablet 6 Not Taking   Scheduled:  sodium  chloride   Intravenous Once   sodium chloride   Intravenous Once   ARIPiprazole  2 mg Oral Daily   cyanocobalamin  1,000 mcg Oral Daily   DULoxetine  60 mg Oral Daily   feeding supplement  1 Container Oral TID BM   folic acid  1 mg Oral Daily   Gerhardt's butt cream   Topical TID   hydrocerin   Topical BID   leptospermum manuka honey  1 Application Topical Daily   multivitamin with minerals  1 tablet Oral Daily   [START ON 12/12/2022] pantoprazole  40 mg Intravenous Q12H   sodium chloride flush  3 mL Intravenous Q12H   tamoxifen  10 mg Oral QODAY   [START ON 12/10/2022] thiamine (VITAMIN B1) injection  100 mg Intravenous Daily   Continuous:  dextrose 5 % and 0.45 % NaCl 100 mL/hr at 12/09/22 0957   pantoprazole 8 mg/hr (12/09/22 1407)   thiamine (VITAMIN B1) injection 500 mg (12/09/22 1409)   ZOX:[id number] **OR** acetaminophen, ALPRAZolam, loperamide, LORazepam **OR** LORazepam, magnesium hydroxide, ondansetron **OR** ondansetron (ZOFRAN) IV Anti-infectives (From admission, onward)    None      Scheduled Meds:  sodium chloride   Intravenous Once   sodium chloride   Intravenous Once   ARIPiprazole  2 mg Oral Daily   cyanocobalamin  1,000 mcg Oral Daily   DULoxetine  60 mg Oral Daily   feeding supplement  1 Container Oral TID BM   folic acid  1 mg Oral Daily   Gerhardt's butt cream   Topical TID   hydrocerin   Topical BID   leptospermum manuka honey  1 Application Topical Daily   multivitamin with minerals  1 tablet Oral Daily   [START ON 12/12/2022] pantoprazole  40 mg Intravenous Q12H   sodium chloride flush  3 mL Intravenous Q12H   tamoxifen  10 mg Oral QODAY   [START ON 12/10/2022] thiamine (VITAMIN B1) injection  100 mg Intravenous Daily   Continuous Infusions:  dextrose 5 % and 0.45 % NaCl 100 mL/hr at 12/09/22 0957   pantoprazole 8 mg/hr (12/09/22 1407)   thiamine (VITAMIN B1) injection 500 mg (12/09/22 1409)   PRN Meds:.acetaminophen **OR** acetaminophen,  ALPRAZolam, loperamide, LORazepam **OR** LORazepam, magnesium hydroxide, ondansetron **OR** ondansetron (ZOFRAN) IV   Assessment: Principal Problem:   Hyponatremia Active Problems:   Essential hypertension   Acute on chronic blood loss anemia   Malignant neoplasm of upper-outer quadrant of right breast in female, estrogen receptor positive (HCC)   Hypokalemia   GERD without esophagitis   Anxiety and depression   History of right breast cancer   Confusion   Alcohol abuse   Pressure injury of skin   Duodenal ulcer   Upper GI bleed  Marilyn Haas is a 82 y.o. female with history of alcohol use, chronic GERD, large hiatal hernia, known history of [place] ulcers is admitted with acute symptomatic anemia, presyncope status post EGD on 9/24 which revealed large cratered duodenal ulcer with visible vessel, s/p epinephrine, cautery followed by hemostatic clips and Hemospray  Plan:  Acute upper GI bleed secondary to bleeding duodenal ulcer s/p EGD on 9/24, treated ulcer with hemostatic spray, clips, epi and cautery Hemoglobin has been stable Okay to start clear liquid diet, advance diet tomorrow if there is no evidence of recurrent GI bleed Monitor CBC closely Continue pantoprazole drip for next 24 to 48 hours, then switch to Protonix 40 mg p.o. twice daily Patient need to stay indefinitely on omeprazole 40 mg p.o. twice daily before meals because of the presence of large hiatal hernia and peptic ulcer disease Recommend to start oral iron at the time of discharge Continue vitamin B12 supplements daily  Elevated LFTs: Secondary to chronic alcohol use Ultrasound revealed hepatic steatosis only Acute viral hepatitis panel negative No evidence of acute liver failure or cirrhosis of liver Monitor LFTs for now Abstinence from alcohol use and avoid NSAID use, excess Tylenol use  GI will sign off at this time, please call us back with questions or concerns   LOS: 2 days   Shae Hinnenkamp 12/09/2022, 2:17 PM

## 2022-12-09 NOTE — [place] FL2 (Signed)
MEDICAID FL2 LEVEL OF CARE FORM     IDENTIFICATION  Patient Name: Marilyn Haas Birthdate: 11/24/1940 Sex: female Admission Date (Current Location): 12/07/2022  [facility] and [name] Number:  Chiropodist and Address:  [facility], [address] [place], [place] [address]      Provider Number: [id number]  Attending Physician Name and Address:  Kathrynn Running, MD  Relative Name and Phone Number:       Current Level of Care: Hospital Recommended Level of Care: Skilled Nursing Facility Prior Approval Number:    Date Approved/Denied:   PASRR Number: [id number]  Discharge Plan: SNF    Current Diagnoses: Patient Active Problem List   Diagnosis Date Noted   Duodenal ulcer 12/08/2022   Upper GI bleed 12/08/2022   Hyponatremia 12/07/2022   Hypokalemia 12/07/2022   GERD without esophagitis 12/07/2022   Anxiety and depression 12/07/2022   History of right breast cancer 12/07/2022   Confusion 12/07/2022   Alcohol abuse 12/07/2022   Pressure injury of skin 12/07/2022   Frequent falls 02/14/2022   GI bleed 02/06/2022   Elevated BUN 02/06/2022   GERD (gastroesophageal reflux disease) 07/05/2021   Fall at home, initial encounter 07/04/2021   MDD (major depressive disorder), recurrent episode, moderate (HCC) 06/13/2018   Dysphagia 09/01/2017   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 (HCC) 08/13/2016   Malignant neoplasm of upper-outer quadrant of right breast in female, estrogen receptor positive (HCC) 06/22/2016   Osteopenia determined by x-ray 06/22/2016   Chronic hyponatremia 10/12/2015   PVC's (premature ventricular contractions) 12/16/2014   Acute on chronic blood loss anemia 08/09/2012   B12 deficiency anemia 08/09/2012   Vitamin D deficiency 08/09/2012   Anxiety 08/09/2012   Hyperlipidemia 05/25/2011   Essential hypertension 05/25/2011    Orientation RESPIRATION BLADDER Height & Weight     Self,  Place  Normal Incontinent, External catheter Weight: 123 lb (55.8 kg) Height:  5\' 1"  (154.9 cm)  BEHAVIORAL SYMPTOMS/MOOD NEUROLOGICAL BOWEL NUTRITION STATUS   (None)  (None) Incontinent Diet (Clear liquids. Follow for discharge recommendations once available.)  AMBULATORY STATUS COMMUNICATION OF NEEDS Skin   Limited Assist Verbally PU Stage and Appropriate Care, Other (Comment) (Erythema/redness. MASD on left breast: Interdry.)   PU Stage 2 Dressing: Daily (Buttocks: Foam.)                   Personal Care Assistance Level of Assistance  Bathing, Feeding, Dressing Bathing Assistance: Limited assistance Feeding assistance: Limited assistance Dressing Assistance: Limited assistance     Functional Limitations Info  Hearing, Sight, Speech Sight Info: Adequate Hearing Info: Adequate Speech Info: Adequate    SPECIAL CARE FACTORS FREQUENCY  PT (By licensed PT), OT (By licensed OT)     PT Frequency: 5 x week OT Frequency: 5 x week            Contractures Contractures Info: Not present    Additional Factors Info  Code Status, Allergies Code Status Info: Full code Allergies Info: Avelox (Moxifloxacin Hcl in Nacl)           Current Medications (12/09/2022):  This is the current hospital active medication list Current Facility-Administered Medications  Medication Dose Route Frequency Provider Last Rate Last Admin   0.9 %  sodium chloride infusion (Manually program via Guardrails IV Fluids)   Intravenous Once Wouk, Wilfred Curtis, MD       0.9 %  sodium chloride infusion (Manually program via Guardrails IV Fluids)  Intravenous Once Mansy, Jan A, MD       acetaminophen (TYLENOL) tablet 650 mg  650 mg Oral Q6H PRN Mansy, Jan A, MD       Or   acetaminophen (TYLENOL) suppository 650 mg  650 mg Rectal Q6H PRN Mansy, Vernetta Honey, MD       ALPRAZolam Prudy Feeler) tablet 0.25 mg  0.25 mg Oral QHS PRN Mansy, Jan A, MD       ARIPiprazole (ABILIFY) tablet 2 mg  2 mg Oral Daily Mansy, Jan A, MD    2 mg at 12/09/22 1001   cyanocobalamin (VITAMIN B12) tablet 1,000 mcg  1,000 mcg Oral Daily Mansy, Jan A, MD   1,000 mcg at 12/09/22 1001   dextrose 5 % and 0.45 % NaCl infusion   Intravenous Continuous Kathrynn Running, MD 100 mL/hr at 12/09/22 0957 New Bag at 12/09/22 0957   DULoxetine (CYMBALTA) DR capsule 60 mg  60 mg Oral Daily Kathrynn Running, MD   60 mg at 12/09/22 1001   feeding supplement (BOOST / RESOURCE BREEZE) liquid 1 Container  1 Container Oral TID BM Toney Reil, MD   1 Container at 12/09/22 1009   folic acid (FOLVITE) tablet 1 mg  1 mg Oral Daily Mansy, Jan A, MD   1 mg at 12/09/22 1001   Gerhardt's butt cream   Topical TID Kathrynn Running, MD   Given at 12/08/22 2110   hydrocerin (EUCERIN) cream   Topical BID Kathrynn Running, MD   Given at 12/08/22 2110   leptospermum manuka honey (MEDIHONEY) paste 1 Application  1 Application Topical Daily Kathrynn Running, MD   1 Application at 12/08/22 1508   loperamide (IMODIUM) capsule 2 mg  2 mg Oral QID PRN Mansy, Jan A, MD       LORazepam (ATIVAN) tablet 1-4 mg  1-4 mg Oral Q1H PRN Wouk, Wilfred Curtis, MD       Or   LORazepam (ATIVAN) injection 1-4 mg  1-4 mg Intravenous Q1H PRN Wouk, Wilfred Curtis, MD       magnesium hydroxide (MILK OF MAGNESIA) suspension 30 mL  30 mL Oral Daily PRN Mansy, Jan A, MD       multivitamin with minerals tablet 1 tablet  1 tablet Oral Daily Wouk, Wilfred Curtis, MD   1 tablet at 12/09/22 1001   ondansetron (ZOFRAN) tablet 4 mg  4 mg Oral Q6H PRN Mansy, Jan A, MD       Or   ondansetron [facility]) injection 4 mg  4 mg Intravenous Q6H PRN Mansy, Jan A, MD       [START ON 12/12/2022] pantoprazole (PROTONIX) injection 40 mg  40 mg Intravenous Q12H Vanga, Loel Dubonnet, MD       pantoprozole (PROTONIX) 80 mg /NS 100 mL infusion  8 mg/hr Intravenous Continuous Toney Reil, MD 10 mL/hr at 12/09/22 0315 8 mg/hr at 12/09/22 0315   sodium chloride flush (NS) 0.9 % injection 3 mL  3 mL Intravenous  Q12H Mansy, Jan A, MD   3 mL at 12/09/22 1010   tamoxifen (NOLVADEX) tablet 10 mg  10 mg Oral QODAY Mansy, Jan A, MD   10 mg at 12/09/22 1002   thiamine (VITAMIN B1) 500 mg in sodium chloride 0.9 % 50 mL IVPB  500 mg Intravenous Q8H Bhagat, Srishti L, MD 100 mL/hr at 12/09/22 0502 500 mg at 12/09/22 0502   Followed by   Melene Muller ON 12/10/2022] thiamine (VITAMIN B1) injection  100 mg  100 mg Intravenous Daily Bhagat, Srishti L, MD         Discharge Medications: Please see discharge summary for a list of discharge medications.  Relevant Imaging Results:  Relevant Lab Results:   Additional Information SS#: [id number]  Margarito Liner, LCSW

## 2022-12-10 DIAGNOSIS — E871 Hypo-osmolality and hyponatremia: Secondary | ICD-10-CM | POA: Diagnosis not present

## 2022-12-10 LAB — CULTURE, BLOOD (ROUTINE X 2)
Special Requests: ADEQUATE
Special Requests: ADEQUATE

## 2022-12-10 LAB — BASIC METABOLIC PANEL
Anion gap: 4 — ABNORMAL LOW (ref 5–15)
BUN: 17 mg/dL (ref 8–23)
CO2: 18 mmol/L — ABNORMAL LOW (ref 22–32)
Calcium: 7.6 mg/dL — ABNORMAL LOW (ref 8.9–10.3)
Chloride: 105 mmol/L (ref 98–111)
Creatinine, Ser: 0.78 mg/dL (ref 0.44–1.00)
GFR, Estimated: >60 mL/min (ref >60)
Glucose, Bld: 99 mg/dL (ref 70–99)
Potassium: 3.5 mmol/L (ref 3.5–5.1)
Sodium: 127 mmol/L — ABNORMAL LOW (ref 135–145)

## 2022-12-10 LAB — CBC
HCT: 23.8 % — ABNORMAL LOW (ref 36.0–46.0)
Hemoglobin: 8.3 g/dL — ABNORMAL LOW (ref 12.0–15.0)
MCH: 32 pg (ref 26.0–34.0)
MCHC: 34.9 g/dL (ref 30.0–36.0)
MCV: 91.9 fL (ref 80.0–100.0)
Platelets: 185 10*3/uL (ref 150–400)
RBC: 2.59 MIL/uL — ABNORMAL LOW (ref 3.87–5.11)
RDW: 16.1 % — ABNORMAL HIGH (ref 11.5–15.5)
WBC: 6.9 10*3/uL (ref 4.0–10.5)
nRBC: 0 % (ref 0.0–0.2)

## 2022-12-10 LAB — GLUCOSE, CAPILLARY: Glucose-Capillary: 115 mg/dL — ABNORMAL HIGH (ref 70–99)

## 2022-12-10 MED ORDER — SODIUM CHLORIDE 1 G PO TABS
1.0000 g | ORAL_TABLET | Freq: Three times a day (TID) | ORAL | Status: DC
  Administered 2022-12-10 – 2022-12-11 (×4): 1 g via ORAL
  Filled 2022-12-10 (×4): qty 1

## 2022-12-10 MED ORDER — PANTOPRAZOLE SODIUM 40 MG IV SOLR
40.0000 mg | Freq: Two times a day (BID) | INTRAVENOUS | Status: DC
  Administered 2022-12-10 – 2022-12-11 (×2): 40 mg via INTRAVENOUS
  Filled 2022-12-10 (×2): qty 10

## 2022-12-10 MED ORDER — LACTULOSE 10 GM/15ML PO SOLN
20.0000 g | Freq: Once | ORAL | Status: AC
  Administered 2022-12-10: 20 g via ORAL
  Filled 2022-12-10: qty 30

## 2022-12-10 NOTE — Progress Notes (Signed)
PROGRESS NOTE    Marilyn Haas  ZOX:[id number] DOB: 03-23-1940 DOA: 12/07/2022 PCP: Elfredia Nevins, MD     Brief Narrative:   Marilyn Haas is a 82 y.o. Caucasian female with medical history significant for anxiety, osteoarthritis, GERD, depression, diverticulosis, chronic back pain, hypertension and osteoporosis, who presented to the emergency room with acute onset of altered mental status with confusion.  She has been less responsive however denied any loss of consciousness.  No fever or chills.  No nausea or vomiting or abdominal pain.  Her last alcoholic drink was a week ago.  She drinks 1-2 beers per day per her report.  No paresthesias or focal muscle weakness.  She denied any dysuria, oliguria or hematuria or flank pain.  Per her son she gets tense and stiff and was mumbling but speaking some.  No chest pain or palpitations.  No cough or wheezing or hemoptysis.  No nausea or vomiting or abdominal pain.    Assessment & Plan:   Principal Problem:   Hyponatremia Active Problems:   Hypokalemia   Alcohol abuse   Essential hypertension   Acute on chronic blood loss anemia   Malignant neoplasm of upper-outer quadrant of right breast in female, estrogen receptor positive (HCC)   GERD without esophagitis   Anxiety and depression   History of right breast cancer   Confusion   Pressure injury of skin   Duodenal ulcer   Acute upper GI bleeding  # Acute encephalopathy Hyponatremia, anemia most likely contributors currently. Etoh may also contribute, as could home xanax. EEG negative, MRI nothing acute. Neurology consulted, has signed off. Treated with high dose thiamine. Mental status has stabilized, appears to be close to baseline  # Hyponatremia, hypoosmolar Baseline hyponatremia here acute on chronic with low urine sodium and elevated urine osm suggestive of hypovolemia though also on meds that can cause siadh. Tsh wnl. Improving with fluids, 121>128>129>127. Acth stim test  neg. - d/c fluids - start salt tabs - decreased duloxetine from 120 to 60  # Anemia # Upper GI bleed # Duodenal ulcer No report of melena or hematochezia. Hospitalized 02/2022 with upper gi bleed, egd with Sheria Lang ulcers. Hgb 7.9 on arrival, 6.0 on repeat. No overt bleeding here. Has received 2 units thus far. Hgb stable now in the 9s, no report of bleeding overnight. Duodenal ulcer seen on 9/24 EGD with visible vessel that began bleeding when injected, treated with 3 hemostatic clips and hemostatic spray. No clinical signs of bleeding but slight hgb drop today - will switch ppi to bid IV, convert to oral tomorrow, gi advises BID treatment - advance to solids - continue to monitor hgb    # Debility PT advising snf - has bed will see if she can d/c tomorrow  # Hiatal hernia - outpt gen surg f/u  # Alcohol abuse No current signs withdrawal - ciwa protocol, vitamins  # History breast cancer - cont home tamoxifen  # GAD -cont home xanax, abilify, duloxetine; have decreased duloxetine dose from 120 to 60  # HTN BPs normalized - home losartan on hold     DVT prophylaxis: SCDs Code Status: full Family Communication: son updated @ bedside today  Level of care: Telemetry Medical Status is: Inpatient Remains inpatient appropriate because: severity of illness    Consultants:  Neuro (signed off) GI  Procedures: EGD 9/24  Antimicrobials:  none    Subjective: No complaints, no bleeding, hasn't stooled  Objective: Vitals:   12/09/22 1925 12/10/22 0416  12/10/22 0437 12/10/22 0833  BP: 119/75 108/71  121/70  Pulse: 90 91  86  Resp: 20 18  16   Temp: 97.9 F (36.6 C) 98.2 F (36.8 C)  98 F (36.7 C)  TempSrc:      SpO2: 99% 95%  99%  Weight:   67.9 kg   Height:        Intake/Output Summary (Last 24 hours) at 12/10/2022 1208 Last data filed at 12/09/2022 1300 Gross per 24 hour  Intake 60 ml  Output --  Net 60 ml   Filed Weights   12/07/22 0027 12/10/22  0437  Weight: 55.8 kg 67.9 kg    Examination:  General exam: Appears calm and comfortable  Respiratory system: Clear to auscultation. Respiratory effort normal. Cardiovascular system: S1 & S2 heard, RRR. Soft systolic murmur Gastrointestinal system: Abdomen is nondistended, soft and nontender. No organomegaly or masses felt. Normal bowel sounds heard. Central nervous system: Alert and oriented to self, moving all 4, symmetric, no facial droop Extremities: Symmetric 5 x 5 power. Skin: No rashes, lesions or ulcers Psychiatry: pleasantly confused    Data Reviewed: I have personally reviewed following labs and imaging studies  CBC: Recent Labs  Lab 12/07/22 0037 12/07/22 0521 12/07/22 0856 12/07/22 1734 12/07/22 2244 12/08/22 0605 12/08/22 1746 12/09/22 0107 12/09/22 0906 12/10/22 0456  WBC 13.0* 11.3*  --  6.9  --  6.6  --   --   --  6.9  NEUTROABS 10.2*  --   --   --   --   --   --   --   --   --   HGB 7.9* 6.0*   < > 8.4*   < > 9.1* 9.4* 9.2* 9.3* 8.3*  HCT 22.7* 16.7*  --  24.1*   < > 25.5* 27.1* 27.6* 28.1* 23.8*  MCV 95.8 94.4  --  89.3  --  90.1  --   --   --  91.9  PLT 250 170  --  151  --  144*  --   --   --  185   < > = values in this interval not displayed.   Basic Metabolic Panel: Recent Labs  Lab 12/07/22 0037 12/07/22 0521 12/07/22 1144 12/07/22 1734 12/08/22 0605 12/09/22 0906 12/10/22 0456  NA 121*   < > 123* 125* 128* 129* 127*  K 3.4*   < > 3.7 3.5 4.2 4.4 3.5  CL 89*   < > 95* 97* 104 108 105  CO2 22   < > 22 21* 19* 17* 18*  GLUCOSE 181*   < > 101* 103* 90 88 99  BUN 26*   < > 24* 24* 23 18 17   CREATININE 0.81   < > 0.66 0.74 0.70 0.78 0.78  CALCIUM 8.0*   < > 7.5* 7.6* 7.4* 7.7* 7.6*  MG 1.9  --   --   --   --   --   --    < > = values in this interval not displayed.   GFR: Estimated Creatinine Clearance: 46.9 mL/min (by C-G formula based on SCr of 0.78 mg/dL). Liver Function Tests: Recent Labs  Lab 12/07/22 0037  AST 89*  ALT 47*   ALKPHOS 79  BILITOT 1.0  PROT 5.2*  ALBUMIN 2.6*   Recent Labs  Lab 12/07/22 0037  LIPASE 26   Recent Labs  Lab 12/07/22 0620  AMMONIA <10   Coagulation Profile: Recent Labs  Lab 12/07/22 0037  INR  1.1   Cardiac Enzymes: Recent Labs  Lab 12/07/22 0037  CKTOTAL 40   BNP (last 3 results) No results for input(s): "PROBNP" in the last 8760 hours. HbA1C: No results for input(s): "HGBA1C" in the last 72 hours. CBG: Recent Labs  Lab 12/08/22 0555 12/09/22 0504 12/09/22 0530 12/09/22 0535 12/10/22 0455  GLUCAP 84 46* 22* 99 115*   Lipid Profile: No results for input(s): "CHOL", "HDL", "LDLCALC", "TRIG", "CHOLHDL", "LDLDIRECT" in the last 72 hours. Thyroid Function Tests: No results for input(s): "TSH", "T4TOTAL", "FREET4", "T3FREE", "THYROIDAB" in the last 72 hours.  Anemia Panel: No results for input(s): "VITAMINB12", "FOLATE", "FERRITIN", "TIBC", "IRON", "RETICCTPCT" in the last 72 hours. Urine analysis:    Component Value Date/Time   COLORURINE YELLOW (A) 12/06/2022 0037   APPEARANCEUR HAZY (A) 12/06/2022 0037   LABSPEC 1.019 12/06/2022 0037   PHURINE 5.0 12/06/2022 0037   GLUCOSEU NEGATIVE 12/06/2022 0037   HGBUR NEGATIVE 12/06/2022 0037   BILIRUBINUR NEGATIVE 12/06/2022 0037   BILIRUBINUR negative 10/26/2022 0841   KETONESUR NEGATIVE 12/06/2022 0037   PROTEINUR NEGATIVE 12/06/2022 0037   UROBILINOGEN 0.2 10/26/2022 0841   UROBILINOGEN 1.0 06/26/2014 1400   NITRITE NEGATIVE 12/06/2022 0037   LEUKOCYTESUR NEGATIVE 12/06/2022 0037   Sepsis Labs: @LABRCNTIP (procalcitonin:4,lacticidven:4)  ) Recent Results (from the past 240 hour(s))  Culture, blood (Routine X 2) w Reflex to ID Panel     Status: None (Preliminary result)   Collection Time: 12/07/22  9:20 AM   Specimen: BLOOD LEFT HAND  Result Value Ref Range Status   Specimen Description BLOOD LEFT HAND  Final   Special Requests   Final    BOTTLES DRAWN AEROBIC AND ANAEROBIC Blood Culture  adequate volume   Culture   Final    NO GROWTH 3 DAYS Performed at [facility], [address] [place], [place] [address]    Report Status PENDING  Incomplete  Culture, blood (Routine X 2) w Reflex to ID Panel     Status: None (Preliminary result)   Collection Time: 12/07/22  9:29 AM   Specimen: BLOOD LEFT ARM  Result Value Ref Range Status   Specimen Description BLOOD LEFT ARM  Final   Special Requests   Final    BOTTLES DRAWN AEROBIC AND ANAEROBIC Blood Culture adequate volume   Culture   Final    NO GROWTH 3 DAYS Performed at [facility], [address], [place], [place] [address]    Report Status PENDING  Incomplete         Radiology Studies: No results found.      Scheduled Meds:  sodium chloride   Intravenous Once   sodium chloride   Intravenous Once   ARIPiprazole  2 mg Oral Daily   cyanocobalamin  1,000 mcg Oral Daily   DULoxetine  60 mg Oral Daily   feeding supplement  1 Container Oral TID BM   folic acid  1 mg Oral Daily   Gerhardt's butt cream   Topical TID   hydrocerin   Topical BID   leptospermum manuka honey  1 Application Topical Daily   multivitamin with minerals  1 tablet Oral Daily   [START ON 12/12/2022] pantoprazole  40 mg Intravenous Q12H   polyethylene glycol  17 g Oral Daily   sodium chloride flush  3 mL Intravenous Q12H   tamoxifen  10 mg Oral QODAY   thiamine (VITAMIN B1) injection  100 mg Intravenous Daily   Continuous Infusions:  dextrose 5 % and 0.45 %  NaCl 100 mL/hr at 12/10/22 1117   pantoprazole 8 mg/hr (12/10/22 0317)     LOS: 3 days     Silvano Bilis, MD [facility]   If 7PM-7AM, please contact night-coverage [email or web address] Password [facility] 12/10/2022, 12:08 PM

## 2022-12-10 NOTE — Plan of Care (Signed)
  Problem: Clinical Measurements: Goal: Ability to maintain clinical measurements within normal limits will improve Outcome: Progressing   Problem: Health Behavior/Discharge Planning: Goal: Ability to manage health-related needs will improve Outcome: Progressing   Problem: Skin Integrity: Goal: Risk for impaired skin integrity will decrease Outcome: Progressing   Problem: Elimination: Goal: Will not experience complications related to urinary retention Outcome: Progressing

## 2022-12-10 NOTE — Progress Notes (Signed)
Occupational Therapy Treatment Patient Details Name: Marilyn Haas MRN: [id number] DOB: 1940-07-16 Today's Date: 12/10/2022   History of present illness Pt is an 82 yo female that presented for AMS, possible seizure, s/p transfusion this admission. Past medical history significant for daily alcohol use, depression on Abilify, chronic pain on chronic opiates, intermittent diarrhea, history of GI bleed and anemia, breast cancer on tamoxifen s/p surgery in 2018 and a lumpectomy in late 2022 with some atypia, very hard of hearing at baseline.   OT comments  Pt seen for OT treatment on this date. Upon arrival to room pt seated in recliner, agreeable to OT Tx session. A&Ox4. OT facilitated ADL management as described below. Requires HHA and minA to transfer from recliner to [facility], performs 3x STS using RW from recliner with MIN A, and needs cues for safety/sequencing with slow processing throughout. Tolerates ~3 mins in standing, some fear with mobility. See ADL section for additional details regarding occupational performance. Pt continues to be functionally limited by decreased activity tolerance, generalized weakness, and impaired balance. RN notified of pt's bleeding from IV site. Pt is progressing toward OT goals and continues to benefit from skilled OT services to maximize return to PLOF and minimize risk of future falls, injury, caregiver burden, and readmission. Will continue to follow POC as written. Discharge recommendation remains appropriate.  Pt left in recliner, call bell in hand, son and MD present, and chair alarm activated.       If plan is discharge home, recommend the following:  A little help with walking and/or transfers;A little help with bathing/dressing/bathroom;Direct supervision/assist for medications management;Help with stairs or ramp for entrance   Equipment Recommendations  BSC/3in1    Recommendations for Other Services      Precautions / Restrictions  Precautions Precautions: Fall Restrictions Weight Bearing Restrictions: No       Mobility Bed Mobility                    Transfers Overall transfer level: Needs assistance Equipment used: Rolling walker (2 wheels) Transfers: Sit to/from Stand Sit to Stand: Min assist     Step pivot transfers: Min assist           Balance Overall balance assessment: Needs assistance Sitting-balance support: No upper extremity supported, Feet supported Sitting balance-Leahy Scale: Good     Standing balance support: During functional activity, Bilateral upper extremity supported Standing balance-Leahy Scale: Fair                             ADL either performed or assessed with clinical judgement   ADL Overall ADL's : Needs assistance/impaired                         Toilet Transfer: Minimal assistance;Stand-pivot;BSC/3in1 Statistician Details (indicate cue type and reason): transfers with HHA, minA, cues for safety/sequencing, pt fearful         Functional mobility during ADLs: Minimal assistance General ADL Comments: MIN A + HHA for recliner to [facility] transfer for BM attempt. STS x 3 for functional transfer training and general strengthening      Cognition Arousal: Alert Behavior During Therapy: WFL for tasks assessed/performed Overall Cognitive Status: Impaired/Different from baseline Area of Impairment: Following commands, Problem solving, Safety/judgement                       Following Commands: Follows one  step commands with increased time, Follows one step commands consistently Safety/Judgement: Decreased awareness of deficits, Decreased awareness of safety   Problem Solving: Slow processing, Decreased initiation General Comments: A&Ox4              General Comments after transfer, noted bleeding from pt's IV site. RN Trinna Post notified.    Pertinent Vitals/ Pain       Pain Assessment Pain Assessment: No/denies  pain   Frequency  Min 1X/week        Progress Toward Goals  OT Goals(current goals can now be found in the care plan section)  Progress towards OT goals: Progressing toward goals  Acute Rehab OT Goals OT Goal Formulation: With patient Time For Goal Achievement: 12/22/22 Potential to Achieve Goals: Good  Plan         AM-PAC OT "6 Clicks" Daily Activity     Outcome Measure   Help from another person eating meals?: None Help from another person taking care of personal grooming?: A Little Help from another person toileting, which includes using toliet, bedpan, or urinal?: A Little Help from another person bathing (including washing, rinsing, drying)?: A Little Help from another person to put on and taking off regular upper body clothing?: A Little Help from another person to put on and taking off regular lower body clothing?: A Little 6 Click Score: 19    End of Session Equipment Utilized During Treatment: Gait belt;Rolling walker (2 wheels)  OT Visit Diagnosis: Other abnormalities of gait and mobility (R26.89);Muscle weakness (generalized) (M62.81)   Activity Tolerance Patient tolerated treatment well   Patient Left in chair;with call bell/phone within reach;with chair alarm set;Other (comment);with family/visitor present (MD in room, son in room)   Nurse Communication Mobility status        Time: 1130-1159 OT Time Calculation (min): 29 min  Charges: OT General Charges $OT Visit: 1 Visit OT Treatments $Self Care/Home Management : 23-37 mins  Shere Eisenhart L. Tyanne Derocher, OTR/L  12/10/22, 12:22 PM

## 2022-12-10 NOTE — Progress Notes (Signed)
Mobility Specialist - Progress Note   12/10/22 1135  Mobility  Activity Transferred from bed to chair  Level of Assistance Minimal assist, patient does 75% or more  Assistive Device Front wheel walker  Distance Ambulated (ft) 6 ft  Activity Response Tolerated well  $Mobility charge 1 Mobility     Pt lying in bed upon arrival, utilizing RA. Aox4. Pt agreeable to activity. Completed bed mobility with light assist. STS and ambulation in room with minA. VC for sequencing + RW negotiation. No LOB. Mild post lean. Pt voiced need to void and was assisted to [facility] with B HHA. Pt left on [facility] with needs in reach, RN at bedside.    Filiberto Pinks Mobility Specialist 12/10/22, 12:23 PM

## 2022-12-10 NOTE — TOC Progression Note (Addendum)
Transition of Care [facility]) - Progression Note    Patient Details  Name: Marilyn Haas MRN: [id number] Date of Birth: 12-Nov-1940  Transition of Care [facility]) CM/SW Contact  Chapman Fitch, RN Phone Number: 12/10/2022, 9:56 AM  Clinical Narrative:     Secure chat message sent to MD to determine when it is anticipated patient will medically be ready for discharge to SNF  Update:  per MD not Medically ready for discharge today,  anticipated tomorrow.  Confirmed with Whitney at [facility] [phone] that there will be be availability tomorrow   Expected Discharge Plan: Skilled Nursing Facility Barriers to Discharge: Continued Medical Work up  Expected Discharge Plan and Services     Post Acute Care Choice: Skilled Nursing Facility Living arrangements for the past 2 months: Single Family Home                                       Social Determinants of Health (SDOH) Interventions SDOH Screenings   Food Insecurity: No Food Insecurity (09/25/2022)  Housing: Low Risk  (09/25/2022)  Transportation Needs: No Transportation Needs (10/26/2022)  Utilities: Not At Risk (03/23/2022)  Alcohol Screen: Low Risk  (03/23/2022)  Depression (PHQ2-9): Medium Risk (03/23/2022)  Financial Resource Strain: Low Risk  (10/26/2022)  Physical Activity: Inactive (03/23/2022)  Social Connections: Socially Isolated (03/23/2022)  Stress: No Stress Concern Present (03/23/2022)  Tobacco Use: Medium Risk (12/07/2022)    Readmission Risk Interventions    12/07/2022   10:54 AM  Readmission Risk Prevention Plan  Transportation Screening Complete  PCP or Specialist Appt within 3-5 Days Complete  HRI or Home Care Consult Complete  Social Work Consult for Recovery Care Planning/Counseling Complete  Palliative Care Screening Not Applicable  Medication Review Oceanographer) Not Complete  Med Review Comments Will be reviewed by charge nurse upon discharge

## 2022-12-10 NOTE — Plan of Care (Signed)
  Problem: Education: Goal: Knowledge of condition and prescribed therapy will improve Outcome: Progressing   Problem: Cardiac: Goal: Will achieve and/or maintain adequate cardiac output Outcome: Progressing   Problem: Physical Regulation: Goal: Complications related to the disease process, condition or treatment will be avoided or minimized Outcome: Progressing   Problem: Education: Goal: Knowledge of General Education information will improve Description: Including pain rating scale, medication(s)/side effects and non-pharmacologic comfort measures Outcome: Progressing   Problem: Health Behavior/Discharge Planning: Goal: Ability to manage health-related needs will improve Outcome: Progressing   Problem: Clinical Measurements: Goal: Ability to maintain clinical measurements within normal limits will improve Outcome: Progressing Goal: Will remain free from infection Outcome: Progressing Goal: Diagnostic test results will improve Outcome: Progressing Goal: Respiratory complications will improve Outcome: Progressing Goal: Cardiovascular complication will be avoided Outcome: Progressing   Problem: Activity: Goal: Risk for activity intolerance will decrease Outcome: Progressing   Problem: Nutrition: Goal: Adequate nutrition will be maintained Outcome: Progressing   Problem: Coping: Goal: Level of anxiety will decrease Outcome: Progressing   Problem: Elimination: Goal: Will not experience complications related to bowel motility Outcome: Progressing Goal: Will not experience complications related to urinary retention Outcome: Progressing   Problem: Pain Managment: Goal: General experience of comfort will improve Outcome: Progressing   Problem: Safety: Goal: Ability to remain free from injury will improve Outcome: Progressing   Problem: Skin Integrity: Goal: Risk for impaired skin integrity will decrease Outcome: Progressing   Pt aox4 at times, mentation  fluctuating throughout the shift. Pt up to chair multiple times. No BM this shift. Dressings changed as ordered . Vitals stable throughout the shift. Pt now resting in bed and has no complaints at this time.

## 2022-12-10 NOTE — Care Management Important Message (Signed)
Important Message  Patient Details  Name: EQUILLA PYLAND MRN: [id number] Date of Birth: 1940-04-25   Important Message Given:  Yes - Medicare IM     Johnell Comings 12/10/2022, 12:38 PM

## 2022-12-11 DIAGNOSIS — M150 Primary generalized (osteo)arthritis: Secondary | ICD-10-CM | POA: Diagnosis not present

## 2022-12-11 DIAGNOSIS — I10 Essential (primary) hypertension: Secondary | ICD-10-CM | POA: Diagnosis not present

## 2022-12-11 DIAGNOSIS — G894 Chronic pain syndrome: Secondary | ICD-10-CM | POA: Diagnosis not present

## 2022-12-11 DIAGNOSIS — E871 Hypo-osmolality and hyponatremia: Secondary | ICD-10-CM | POA: Diagnosis not present

## 2022-12-11 LAB — BASIC METABOLIC PANEL
Anion gap: 4 — ABNORMAL LOW (ref 5–15)
BUN: 16 mg/dL (ref 8–23)
CO2: 20 mmol/L — ABNORMAL LOW (ref 22–32)
Calcium: 7.6 mg/dL — ABNORMAL LOW (ref 8.9–10.3)
Chloride: 103 mmol/L (ref 98–111)
Creatinine, Ser: 0.77 mg/dL (ref 0.44–1.00)
GFR, Estimated: >60 mL/min (ref >60)
Glucose, Bld: 87 mg/dL (ref 70–99)
Potassium: 3.7 mmol/L (ref 3.5–5.1)
Sodium: 127 mmol/L — ABNORMAL LOW (ref 135–145)

## 2022-12-11 LAB — GLUCOSE, CAPILLARY: Glucose-Capillary: 79 mg/dL (ref 70–99)

## 2022-12-11 LAB — CBC
HCT: 22.9 % — ABNORMAL LOW (ref 36.0–46.0)
Hemoglobin: 8 g/dL — ABNORMAL LOW (ref 12.0–15.0)
MCH: 31.9 pg (ref 26.0–34.0)
MCHC: 34.9 g/dL (ref 30.0–36.0)
MCV: 91.2 fL (ref 80.0–100.0)
Platelets: 186 10*3/uL (ref 150–400)
RBC: 2.51 MIL/uL — ABNORMAL LOW (ref 3.87–5.11)
RDW: 15.8 % — ABNORMAL HIGH (ref 11.5–15.5)
WBC: 6.1 10*3/uL (ref 4.0–10.5)
nRBC: 0 % (ref 0.0–0.2)

## 2022-12-11 MED ORDER — DULOXETINE HCL 30 MG PO CPEP: 120.0000 mg | ORAL_CAPSULE | Freq: Every day | ORAL | Status: DC

## 2022-12-11 MED ORDER — SODIUM CHLORIDE 1 G PO TABS: 1.0000 g | ORAL_TABLET | Freq: Three times a day (TID) | ORAL | Status: DC

## 2022-12-11 MED ORDER — ALPRAZOLAM 0.25 MG PO TABS: 0.2500 mg | ORAL_TABLET | Freq: Every evening | ORAL | 0 refills | Status: DC | PRN

## 2022-12-11 MED ORDER — POLYETHYLENE GLYCOL 3350 17 G PO PACK: 17.0000 g | PACK | Freq: Every day | ORAL | Status: DC

## 2022-12-11 MED ORDER — LACTULOSE 10 GM/15ML PO SOLN
20.0000 g | Freq: Once | ORAL | Status: AC
  Administered 2022-12-11: 20 g via ORAL
  Filled 2022-12-11: qty 30

## 2022-12-11 NOTE — Progress Notes (Signed)
Occupational Therapy Treatment Patient Details Name: Marilyn Haas MRN: [id number] DOB: 1940/06/09 Today's Date: 12/11/2022   History of present illness Pt is an 82 yo female that presented for AMS, possible seizure, s/p transfusion this admission. Past medical history significant for daily alcohol use, depression on Abilify, chronic pain on chronic opiates, intermittent diarrhea, history of GI bleed and anemia, breast cancer on tamoxifen s/p surgery in 2018 and a lumpectomy in late 2022 with some atypia, very hard of hearing at baseline.   OT comments  Pt seen for OT treatment on this date. Upon arrival to room pt supine in bed, agreeable to tx. Pt continues to be functionally limited by decreased activity tolerance, generalized weakness, and impaired balance. Patient requires Min A with RW for EOB to [facility] t/f for BM and needed Mod A for pericare seated. Pt needed cues to initiate ADL tasks. Pt making good progress toward goals, will continue to follow POC. Discharge recommendation remains appropriate.        If plan is discharge home, recommend the following:  A little help with walking and/or transfers;A little help with bathing/dressing/bathroom;Direct supervision/assist for medications management;Help with stairs or ramp for entrance   Equipment Recommendations  BSC/3in1    Recommendations for Other Services      Precautions / Restrictions Precautions Precautions: Fall Restrictions Weight Bearing Restrictions: No       Mobility Bed Mobility Overal bed mobility: Needs Assistance Bed Mobility: Supine to Sit, Sit to Supine     Supine to sit: Supervision Sit to supine: Supervision        Transfers Overall transfer level: Needs assistance Equipment used: Rolling walker (2 wheels), 1 person hand held assist Transfers: Sit to/from Stand Sit to Stand: Min assist Stand pivot transfers: Min assist               Balance Overall balance assessment: Needs  assistance Sitting-balance support: No upper extremity supported, Feet supported Sitting balance-Leahy Scale: Good     Standing balance support: During functional activity, Bilateral upper extremity supported Standing balance-Leahy Scale: Fair                             ADL either performed or assessed with clinical judgement   ADL Overall ADL's : Needs assistance/impaired     Grooming: Brushing hair;Set up;Sitting                   Toilet Transfer: Minimal assistance;Stand-pivot;BSC/3in1;Rolling walker (2 wheels) Toilet Transfer Details (indicate cue type and reason): transfers with HHA, minA, cues for safety/sequencing Toileting- Clothing Manipulation and Hygiene: Moderate assistance;Sitting/lateral lean Toileting - Clothing Manipulation Details (indicate cue type and reason): Mod A for pericare     Functional mobility during ADLs: Minimal assistance General ADL Comments: MIN A  for EOB to BSC t/f for BM attempt.    Extremity/Trunk Assessment Upper Extremity Assessment Upper Extremity Assessment: Generalized weakness   Lower Extremity Assessment Lower Extremity Assessment: Generalized weakness        Vision       Perception     Praxis      Cognition Arousal: Alert Behavior During Therapy: WFL for tasks assessed/performed Overall Cognitive Status: Impaired/Different from baseline Area of Impairment: Following commands, Problem solving, Safety/judgement                       Following Commands: Follows one step commands with increased time, Follows one step commands  consistently Safety/Judgement: Decreased awareness of deficits, Decreased awareness of safety   Problem Solving: Slow processing, Decreased initiation          Exercises      Shoulder Instructions       General Comments      Pertinent Vitals/ Pain       Pain Assessment Pain Assessment: No/denies pain Faces Pain Scale: No hurt  Home Living                                           Prior Functioning/Environment              Frequency  Min 1X/week        Progress Toward Goals  OT Goals(current goals can now be found in the care plan section)  Progress towards OT goals: Progressing toward goals  Acute Rehab OT Goals Patient Stated Goal: to get stronger OT Goal Formulation: With patient Time For Goal Achievement: 12/22/22 Potential to Achieve Goals: Good ADL Goals Pt Will Perform Grooming: with modified independence;standing Pt Will Perform Lower Body Dressing: with modified independence;sit to/from stand Pt Will Transfer to Toilet: with supervision;ambulating;regular height toilet Pt Will Perform Toileting - Clothing Manipulation and hygiene: with modified independence;sitting/lateral leans;sit to/from stand  Plan      Co-evaluation                 AM-PAC OT "6 Clicks" Daily Activity     Outcome Measure   Help from another person eating meals?: None Help from another person taking care of personal grooming?: A Little Help from another person toileting, which includes using toliet, bedpan, or urinal?: A Lot Help from another person bathing (including washing, rinsing, drying)?: A Lot Help from another person to put on and taking off regular upper body clothing?: A Little Help from another person to put on and taking off regular lower body clothing?: A Lot 6 Click Score: 16    End of Session Equipment Utilized During Treatment: Gait belt;Rolling walker (2 wheels)  OT Visit Diagnosis: Other abnormalities of gait and mobility (R26.89);Muscle weakness (generalized) (M62.81)   Activity Tolerance Patient tolerated treatment well   Patient Left in bed;with call bell/phone within reach;with bed alarm set   Nurse Communication          Time: 1125-1140 OT Time Calculation (min): 15 min  Charges: OT General Charges $OT Visit: 1 Visit OT Treatments $Self Care/Home Management : 8-22  mins  Butch Penny, SOT

## 2022-12-11 NOTE — Plan of Care (Signed)
The patient has been discharged. IV has been removed. Education has been provided to [facility].  Problem: Education: Goal: Knowledge of condition and prescribed therapy will improve 12/11/2022 1803 by Murriel Hopper, Donald Pore, RN Outcome: Completed/Met 12/11/2022 1802 by Murriel Hopper, Donald Pore, RN Outcome: Progressing   Problem: Cardiac: Goal: Will achieve and/or maintain adequate cardiac output 12/11/2022 1803 by Murriel Hopper, Donald Pore, RN Outcome: Completed/Met 12/11/2022 1802 by Murriel Hopper, Donald Pore, RN Outcome: Progressing   Problem: Physical Regulation: Goal: Complications related to the disease process, condition or treatment will be avoided or minimized 12/11/2022 1803 by Murriel Hopper, Donald Pore, RN Outcome: Completed/Met 12/11/2022 1802 by Murriel Hopper, Donald Pore, RN Outcome: Progressing   Problem: Education: Goal: Knowledge of General Education information will improve Description: Including pain rating scale, medication(s)/side effects and non-pharmacologic comfort measures 12/11/2022 1803 by Murriel Hopper, Donald Pore, RN Outcome: Completed/Met 12/11/2022 1802 by Murriel Hopper, Donald Pore, RN Outcome: Progressing   Problem: Health Behavior/Discharge Planning: Goal: Ability to manage health-related needs will improve 12/11/2022 1803 by Murriel Hopper, Donald Pore, RN Outcome: Completed/Met 12/11/2022 1802 by Murriel Hopper, Donald Pore, RN Outcome: Progressing   Problem: Clinical Measurements: Goal: Ability to maintain clinical measurements within normal limits will improve 12/11/2022 1803 by Murriel Hopper, Donald Pore, RN Outcome: Completed/Met 12/11/2022 1802 by Murriel Hopper, Donald Pore, RN Outcome: Progressing Goal: Will remain free from infection 12/11/2022 1803 by Murriel Hopper, Donald Pore, RN Outcome: Completed/Met 12/11/2022 1802 by Murriel Hopper, Donald Pore, RN Outcome: Progressing Goal: Diagnostic test results will improve 12/11/2022 1803 by Monica Becton, RN Outcome:  Completed/Met 12/11/2022 1802 by Murriel Hopper, Donald Pore, RN Outcome: Progressing Goal: Respiratory complications will improve 12/11/2022 1803 by Monica Becton, RN Outcome: Completed/Met 12/11/2022 1802 by Murriel Hopper, Donald Pore, RN Outcome: Progressing Goal: Cardiovascular complication will be avoided 12/11/2022 1803 by Monica Becton, RN Outcome: Completed/Met 12/11/2022 1802 by Monica Becton, RN Outcome: Progressing   Problem: Activity: Goal: Risk for activity intolerance will decrease 12/11/2022 1803 by Murriel Hopper, Donald Pore, RN Outcome: Completed/Met 12/11/2022 1802 by Murriel Hopper, Donald Pore, RN Outcome: Progressing   Problem: Nutrition: Goal: Adequate nutrition will be maintained 12/11/2022 1803 by Murriel Hopper, Donald Pore, RN Outcome: Completed/Met 12/11/2022 1802 by Murriel Hopper, Donald Pore, RN Outcome: Progressing   Problem: Coping: Goal: Level of anxiety will decrease 12/11/2022 1803 by Murriel Hopper, Donald Pore, RN Outcome: Completed/Met 12/11/2022 1802 by Murriel Hopper, Donald Pore, RN Outcome: Progressing   Problem: Elimination: Goal: Will not experience complications related to bowel motility 12/11/2022 1803 by Murriel Hopper, Donald Pore, RN Outcome: Completed/Met 12/11/2022 1802 by Murriel Hopper, Donald Pore, RN Outcome: Progressing Goal: Will not experience complications related to urinary retention 12/11/2022 1803 by Monica Becton, RN Outcome: Completed/Met 12/11/2022 1802 by Murriel Hopper, Donald Pore, RN Outcome: Progressing   Problem: Pain Managment: Goal: General experience of comfort will improve 12/11/2022 1803 by Murriel Hopper, Donald Pore, RN Outcome: Completed/Met 12/11/2022 1802 by Murriel Hopper, Donald Pore, RN Outcome: Progressing   Problem: Safety: Goal: Ability to remain free from injury will improve 12/11/2022 1803 by Murriel Hopper, Donald Pore, RN Outcome: Completed/Met 12/11/2022 1802 by Murriel Hopper, Donald Pore, RN Outcome:  Progressing   Problem: Skin Integrity: Goal: Risk for impaired skin integrity will decrease 12/11/2022 1803 by Monica Becton, RN Outcome: Completed/Met 12/11/2022 1802 by Murriel Hopper, Donald Pore, RN Outcome: Progressing   Problem: Acute Rehab OT Goals (only OT should resolve) Goal: Pt. Will Perform Grooming Outcome: Completed/Met Goal: Pt. Will Perform Lower Body Dressing Outcome: Completed/Met Goal: Pt. Will Transfer To Toilet Outcome: Completed/Met Goal: Pt. Will Perform Toileting-Clothing Manipulation Outcome: Completed/Met   Problem: Acute  Rehab PT Goals(only PT should resolve) Goal: Pt Will Go Supine/Side To Sit Outcome: Completed/Met Goal: Patient Will Transfer Sit To/From Stand Outcome: Completed/Met Goal: Pt Will Transfer Bed To Chair/Chair To Bed Outcome: Completed/Met Goal: Pt Will Ambulate Outcome: Completed/Met

## 2022-12-11 NOTE — Discharge Summary (Signed)
[place] HYQ:[id number] DOB: 11/07/1940 DOA: 12/07/2022  PCP: Elfredia Nevins, MD  Admit date: 12/07/2022 Discharge date: 12/11/2022  Time spent: 35 minutes  Recommendations for Outpatient Follow-up:  Pcp f/u CBC/BMP one week GI f/u    Discharge Diagnoses:  Principal Problem:   Hyponatremia Active Problems:   Hypokalemia   Alcohol abuse   Essential hypertension   Acute on chronic blood loss anemia   Malignant neoplasm of upper-outer quadrant of right breast in female, estrogen receptor positive (HCC)   GERD without esophagitis   Anxiety and depression   History of right breast cancer   Confusion   Pressure injury of skin   Duodenal ulcer   Acute upper GI bleeding   Discharge Condition: stable  Diet recommendation: heart healthy  Filed Weights   12/07/22 0027 12/10/22 0437 12/11/22 0500  Weight: 55.8 kg 67.9 kg 65.6 kg    History of present illness:  From admission h and p Marilyn Haas is a 82 y.o. Caucasian female with medical history significant for anxiety, osteoarthritis, GERD, depression, diverticulosis, chronic back pain, hypertension and osteoporosis, who presented to the emergency room with acute onset of altered mental status with confusion.  She has been less responsive however denied any loss of consciousness.  No fever or chills.  No nausea or vomiting or abdominal pain.  Her last alcoholic drink was a week ago.  She drinks 1-2 beers per day per her report.  No paresthesias or focal muscle weakness.  She denied any dysuria, oliguria or hematuria or flank pain.  Per her son she gets tense and stiff and was mumbling but speaking some.  No chest pain or palpitations.  No cough or wheezing or hemoptysis.  No nausea or vomiting or abdominal pain.   Hospital Course:  # Acute encephalopathy Hyponatremia, anemia most likely contributors currently. Etoh may also contribute, as could home xanax. EEG negative, MRI nothing acute. Neurology consulted, has  signed off. Treated with high dose thiamine. Mental status has stabilized, appears to be back to baseline   # Hyponatremia, hypoosmolar Baseline hyponatremia here acute on chronic with low urine sodium and elevated urine osm suggestive of hypovolemia though also on meds that can cause siadh. Tsh wnl. Improving with fluids, 121>128>129>127. Acth stim test neg. - started salt tabs - advise weaning off duloxetine   # Anemia # Upper GI bleed # Duodenal ulcer No report of melena or hematochezia. Hospitalized 02/2022 with upper gi bleed, egd with Sheria Lang ulcers. Hgb 7.9 on arrival, 6.0 on repeat. No overt bleeding here. Has received 2 units PRBCs here. Duodenal ulcer seen on 9/24 EGD with visible vessel that began bleeding when injected, treated with 3 hemostatic clips and hemostatic spray. Hgb stable now in the 8s, no clinical signs of bleeding  - continue bid ppi indefinitely - outpatient GI f/u - monitor hgb   # Debility D/c to SNF   # Hiatal hernia - consider outpt gen surg f/u   # Alcohol abuse No signs withdrawal here - outpatient treatment   # History breast cancer - cont home tamoxifen   # GAD -cont home xanax, abilify, duloxetine; have decreased duloxetine dose from 120 to 60 and advise weaning off that given hyponatremia   # HTN BPs normal off home meds - home losartan on hold  # Stage 2 sacral ulcer (POD) - wound care  Procedures: EGD   Consultations: GI  Discharge Exam: Vitals:   12/11/22 0502 12/11/22 0820  BP: (!) 108/59 110/66  [place] HYQ:[id number] DOB: 11/07/1940 DOA: 12/07/2022  PCP: Elfredia Nevins, MD  Admit date: 12/07/2022 Discharge date: 12/11/2022  Time spent: 35 minutes  Recommendations for Outpatient Follow-up:  Pcp f/u CBC/BMP one week GI f/u    Discharge Diagnoses:  Principal Problem:   Hyponatremia Active Problems:   Hypokalemia   Alcohol abuse   Essential hypertension   Acute on chronic blood loss anemia   Malignant neoplasm of upper-outer quadrant of right breast in female, estrogen receptor positive (HCC)   GERD without esophagitis   Anxiety and depression   History of right breast cancer   Confusion   Pressure injury of skin   Duodenal ulcer   Acute upper GI bleeding   Discharge Condition: stable  Diet recommendation: heart healthy  Filed Weights   12/07/22 0027 12/10/22 0437 12/11/22 0500  Weight: 55.8 kg 67.9 kg 65.6 kg    History of present illness:  From admission h and p Marilyn Haas is a 82 y.o. Caucasian female with medical history significant for anxiety, osteoarthritis, GERD, depression, diverticulosis, chronic back pain, hypertension and osteoporosis, who presented to the emergency room with acute onset of altered mental status with confusion.  She has been less responsive however denied any loss of consciousness.  No fever or chills.  No nausea or vomiting or abdominal pain.  Her last alcoholic drink was a week ago.  She drinks 1-2 beers per day per her report.  No paresthesias or focal muscle weakness.  She denied any dysuria, oliguria or hematuria or flank pain.  Per her son she gets tense and stiff and was mumbling but speaking some.  No chest pain or palpitations.  No cough or wheezing or hemoptysis.  No nausea or vomiting or abdominal pain.   Hospital Course:  # Acute encephalopathy Hyponatremia, anemia most likely contributors currently. Etoh may also contribute, as could home xanax. EEG negative, MRI nothing acute. Neurology consulted, has  signed off. Treated with high dose thiamine. Mental status has stabilized, appears to be back to baseline   # Hyponatremia, hypoosmolar Baseline hyponatremia here acute on chronic with low urine sodium and elevated urine osm suggestive of hypovolemia though also on meds that can cause siadh. Tsh wnl. Improving with fluids, 121>128>129>127. Acth stim test neg. - started salt tabs - advise weaning off duloxetine   # Anemia # Upper GI bleed # Duodenal ulcer No report of melena or hematochezia. Hospitalized 02/2022 with upper gi bleed, egd with Sheria Lang ulcers. Hgb 7.9 on arrival, 6.0 on repeat. No overt bleeding here. Has received 2 units PRBCs here. Duodenal ulcer seen on 9/24 EGD with visible vessel that began bleeding when injected, treated with 3 hemostatic clips and hemostatic spray. Hgb stable now in the 8s, no clinical signs of bleeding  - continue bid ppi indefinitely - outpatient GI f/u - monitor hgb   # Debility D/c to SNF   # Hiatal hernia - consider outpt gen surg f/u   # Alcohol abuse No signs withdrawal here - outpatient treatment   # History breast cancer - cont home tamoxifen   # GAD -cont home xanax, abilify, duloxetine; have decreased duloxetine dose from 120 to 60 and advise weaning off that given hyponatremia   # HTN BPs normal off home meds - home losartan on hold  # Stage 2 sacral ulcer (POD) - wound care  Procedures: EGD   Consultations: GI  Discharge Exam: Vitals:   12/11/22 0502 12/11/22 0820  BP: (!) 108/59 110/66  [place] HYQ:[id number] DOB: 11/07/1940 DOA: 12/07/2022  PCP: Elfredia Nevins, MD  Admit date: 12/07/2022 Discharge date: 12/11/2022  Time spent: 35 minutes  Recommendations for Outpatient Follow-up:  Pcp f/u CBC/BMP one week GI f/u    Discharge Diagnoses:  Principal Problem:   Hyponatremia Active Problems:   Hypokalemia   Alcohol abuse   Essential hypertension   Acute on chronic blood loss anemia   Malignant neoplasm of upper-outer quadrant of right breast in female, estrogen receptor positive (HCC)   GERD without esophagitis   Anxiety and depression   History of right breast cancer   Confusion   Pressure injury of skin   Duodenal ulcer   Acute upper GI bleeding   Discharge Condition: stable  Diet recommendation: heart healthy  Filed Weights   12/07/22 0027 12/10/22 0437 12/11/22 0500  Weight: 55.8 kg 67.9 kg 65.6 kg    History of present illness:  From admission h and p Marilyn Haas is a 82 y.o. Caucasian female with medical history significant for anxiety, osteoarthritis, GERD, depression, diverticulosis, chronic back pain, hypertension and osteoporosis, who presented to the emergency room with acute onset of altered mental status with confusion.  She has been less responsive however denied any loss of consciousness.  No fever or chills.  No nausea or vomiting or abdominal pain.  Her last alcoholic drink was a week ago.  She drinks 1-2 beers per day per her report.  No paresthesias or focal muscle weakness.  She denied any dysuria, oliguria or hematuria or flank pain.  Per her son she gets tense and stiff and was mumbling but speaking some.  No chest pain or palpitations.  No cough or wheezing or hemoptysis.  No nausea or vomiting or abdominal pain.   Hospital Course:  # Acute encephalopathy Hyponatremia, anemia most likely contributors currently. Etoh may also contribute, as could home xanax. EEG negative, MRI nothing acute. Neurology consulted, has  signed off. Treated with high dose thiamine. Mental status has stabilized, appears to be back to baseline   # Hyponatremia, hypoosmolar Baseline hyponatremia here acute on chronic with low urine sodium and elevated urine osm suggestive of hypovolemia though also on meds that can cause siadh. Tsh wnl. Improving with fluids, 121>128>129>127. Acth stim test neg. - started salt tabs - advise weaning off duloxetine   # Anemia # Upper GI bleed # Duodenal ulcer No report of melena or hematochezia. Hospitalized 02/2022 with upper gi bleed, egd with Sheria Lang ulcers. Hgb 7.9 on arrival, 6.0 on repeat. No overt bleeding here. Has received 2 units PRBCs here. Duodenal ulcer seen on 9/24 EGD with visible vessel that began bleeding when injected, treated with 3 hemostatic clips and hemostatic spray. Hgb stable now in the 8s, no clinical signs of bleeding  - continue bid ppi indefinitely - outpatient GI f/u - monitor hgb   # Debility D/c to SNF   # Hiatal hernia - consider outpt gen surg f/u   # Alcohol abuse No signs withdrawal here - outpatient treatment   # History breast cancer - cont home tamoxifen   # GAD -cont home xanax, abilify, duloxetine; have decreased duloxetine dose from 120 to 60 and advise weaning off that given hyponatremia   # HTN BPs normal off home meds - home losartan on hold  # Stage 2 sacral ulcer (POD) - wound care  Procedures: EGD   Consultations: GI  Discharge Exam: Vitals:   12/11/22 0502 12/11/22 0820  BP: (!) 108/59 110/66  1126           Allergies  Allergen Reactions   Avelox [Moxifloxacin Hcl In Nacl] Other (See Comments)    Altered mental status    Contact information for follow-up providers     Elfredia Nevins, MD Follow up.   Specialty: Internal Medicine Contact information: [address] [place] [address] [phone]         Aida Raider, NP Follow up.   Specialty: Gastroenterology Contact information: [address] [place] [address] [phone]              Contact information for after-discharge care     Destination     [facility] .   Service: Skilled Nursing Contact information: [address] [place] [address] [phone]                      The results of significant diagnostics from this hospitalization (including imaging, microbiology, ancillary and laboratory) are listed below for reference.    Significant Diagnostic Studies: US Abdomen Limited RUQ (LIVER/GB)  Result Date: 12/08/2022 CLINICAL DATA:  Elevated LFTs EXAM: ULTRASOUND ABDOMEN LIMITED RIGHT UPPER QUADRANT COMPARISON:  None Available. FINDINGS: Gallbladder: There is layering sludge and small stones in the gallbladder with stones  measuring up to 5 mm. There is no gallbladder wall thickening or pericholecystic fluid. Sonographic Murphy's sign was reported negative. Common bile duct: Diameter: 3 mm Liver: Parenchymal echogenicity is diffusely increased. There are no focal lesions. There is no intrahepatic biliary ductal dilatation. Portal vein is patent on color Doppler imaging with normal direction of blood flow towards the liver. Other: There is no free fluid. IMPRESSION: 1. Cholelithiasis without evidence of acute cholecystitis. 2. Hepatic steatosis. Electronically Signed   By: Lesia Hausen M.D.   On: 12/08/2022 13:01   ECHOCARDIOGRAM COMPLETE  Result Date: 12/08/2022    ECHOCARDIOGRAM REPORT   Patient Name:   Marilyn Haas Date of Exam: 12/07/2022 Medical Rec #:  [id number]         Height:       61.0 in Accession #:    [id number]        Weight:       123.0 lb Date of Birth:  October 10, 1940        BSA:          1.536 m Patient Age:    77 years          BP:           124/78 mmHg Patient Gender: F                 HR:           87 bpm. Exam Location:  [facility] Procedure: 2D Echo, Cardiac Doppler and Color Doppler Indications:     R55 Syncope  History:         Patient has prior history of Echocardiogram examinations, most                  recent 02/18/2016.  Sonographer:     Daphine Deutscher RDCS Referring Phys:  [id number] Vernetta Honey MANSY Diagnosing Phys: Yvonne Kendall MD IMPRESSIONS  1. Left ventricular ejection fraction, by estimation, is >55%. The left ventricle has normal function. Left ventricular endocardial border not optimally defined to evaluate regional wall motion. Left ventricular diastolic parameters are consistent with Grade I diastolic dysfunction (impaired relaxation).  2. Right ventricular systolic function is mildly reduced.  [place] HYQ:[id number] DOB: 11/07/1940 DOA: 12/07/2022  PCP: Elfredia Nevins, MD  Admit date: 12/07/2022 Discharge date: 12/11/2022  Time spent: 35 minutes  Recommendations for Outpatient Follow-up:  Pcp f/u CBC/BMP one week GI f/u    Discharge Diagnoses:  Principal Problem:   Hyponatremia Active Problems:   Hypokalemia   Alcohol abuse   Essential hypertension   Acute on chronic blood loss anemia   Malignant neoplasm of upper-outer quadrant of right breast in female, estrogen receptor positive (HCC)   GERD without esophagitis   Anxiety and depression   History of right breast cancer   Confusion   Pressure injury of skin   Duodenal ulcer   Acute upper GI bleeding   Discharge Condition: stable  Diet recommendation: heart healthy  Filed Weights   12/07/22 0027 12/10/22 0437 12/11/22 0500  Weight: 55.8 kg 67.9 kg 65.6 kg    History of present illness:  From admission h and p Marilyn Haas is a 82 y.o. Caucasian female with medical history significant for anxiety, osteoarthritis, GERD, depression, diverticulosis, chronic back pain, hypertension and osteoporosis, who presented to the emergency room with acute onset of altered mental status with confusion.  She has been less responsive however denied any loss of consciousness.  No fever or chills.  No nausea or vomiting or abdominal pain.  Her last alcoholic drink was a week ago.  She drinks 1-2 beers per day per her report.  No paresthesias or focal muscle weakness.  She denied any dysuria, oliguria or hematuria or flank pain.  Per her son she gets tense and stiff and was mumbling but speaking some.  No chest pain or palpitations.  No cough or wheezing or hemoptysis.  No nausea or vomiting or abdominal pain.   Hospital Course:  # Acute encephalopathy Hyponatremia, anemia most likely contributors currently. Etoh may also contribute, as could home xanax. EEG negative, MRI nothing acute. Neurology consulted, has  signed off. Treated with high dose thiamine. Mental status has stabilized, appears to be back to baseline   # Hyponatremia, hypoosmolar Baseline hyponatremia here acute on chronic with low urine sodium and elevated urine osm suggestive of hypovolemia though also on meds that can cause siadh. Tsh wnl. Improving with fluids, 121>128>129>127. Acth stim test neg. - started salt tabs - advise weaning off duloxetine   # Anemia # Upper GI bleed # Duodenal ulcer No report of melena or hematochezia. Hospitalized 02/2022 with upper gi bleed, egd with Sheria Lang ulcers. Hgb 7.9 on arrival, 6.0 on repeat. No overt bleeding here. Has received 2 units PRBCs here. Duodenal ulcer seen on 9/24 EGD with visible vessel that began bleeding when injected, treated with 3 hemostatic clips and hemostatic spray. Hgb stable now in the 8s, no clinical signs of bleeding  - continue bid ppi indefinitely - outpatient GI f/u - monitor hgb   # Debility D/c to SNF   # Hiatal hernia - consider outpt gen surg f/u   # Alcohol abuse No signs withdrawal here - outpatient treatment   # History breast cancer - cont home tamoxifen   # GAD -cont home xanax, abilify, duloxetine; have decreased duloxetine dose from 120 to 60 and advise weaning off that given hyponatremia   # HTN BPs normal off home meds - home losartan on hold  # Stage 2 sacral ulcer (POD) - wound care  Procedures: EGD   Consultations: GI  Discharge Exam: Vitals:   12/11/22 0502 12/11/22 0820  BP: (!) 108/59 110/66  1126           Allergies  Allergen Reactions   Avelox [Moxifloxacin Hcl In Nacl] Other (See Comments)    Altered mental status    Contact information for follow-up providers     Elfredia Nevins, MD Follow up.   Specialty: Internal Medicine Contact information: [address] [place] [address] [phone]         Aida Raider, NP Follow up.   Specialty: Gastroenterology Contact information: [address] [place] [address] [phone]              Contact information for after-discharge care     Destination     [facility] .   Service: Skilled Nursing Contact information: [address] [place] [address] [phone]                      The results of significant diagnostics from this hospitalization (including imaging, microbiology, ancillary and laboratory) are listed below for reference.    Significant Diagnostic Studies: US Abdomen Limited RUQ (LIVER/GB)  Result Date: 12/08/2022 CLINICAL DATA:  Elevated LFTs EXAM: ULTRASOUND ABDOMEN LIMITED RIGHT UPPER QUADRANT COMPARISON:  None Available. FINDINGS: Gallbladder: There is layering sludge and small stones in the gallbladder with stones  measuring up to 5 mm. There is no gallbladder wall thickening or pericholecystic fluid. Sonographic Murphy's sign was reported negative. Common bile duct: Diameter: 3 mm Liver: Parenchymal echogenicity is diffusely increased. There are no focal lesions. There is no intrahepatic biliary ductal dilatation. Portal vein is patent on color Doppler imaging with normal direction of blood flow towards the liver. Other: There is no free fluid. IMPRESSION: 1. Cholelithiasis without evidence of acute cholecystitis. 2. Hepatic steatosis. Electronically Signed   By: Lesia Hausen M.D.   On: 12/08/2022 13:01   ECHOCARDIOGRAM COMPLETE  Result Date: 12/08/2022    ECHOCARDIOGRAM REPORT   Patient Name:   Marilyn Haas Date of Exam: 12/07/2022 Medical Rec #:  [id number]         Height:       61.0 in Accession #:    [id number]        Weight:       123.0 lb Date of Birth:  October 10, 1940        BSA:          1.536 m Patient Age:    77 years          BP:           124/78 mmHg Patient Gender: F                 HR:           87 bpm. Exam Location:  [facility] Procedure: 2D Echo, Cardiac Doppler and Color Doppler Indications:     R55 Syncope  History:         Patient has prior history of Echocardiogram examinations, most                  recent 02/18/2016.  Sonographer:     Daphine Deutscher RDCS Referring Phys:  [id number] Vernetta Honey MANSY Diagnosing Phys: Yvonne Kendall MD IMPRESSIONS  1. Left ventricular ejection fraction, by estimation, is >55%. The left ventricle has normal function. Left ventricular endocardial border not optimally defined to evaluate regional wall motion. Left ventricular diastolic parameters are consistent with Grade I diastolic dysfunction (impaired relaxation).  2. Right ventricular systolic function is mildly reduced.  1126           Allergies  Allergen Reactions   Avelox [Moxifloxacin Hcl In Nacl] Other (See Comments)    Altered mental status    Contact information for follow-up providers     Elfredia Nevins, MD Follow up.   Specialty: Internal Medicine Contact information: [address] [place] [address] [phone]         Aida Raider, NP Follow up.   Specialty: Gastroenterology Contact information: [address] [place] [address] [phone]              Contact information for after-discharge care     Destination     [facility] .   Service: Skilled Nursing Contact information: [address] [place] [address] [phone]                      The results of significant diagnostics from this hospitalization (including imaging, microbiology, ancillary and laboratory) are listed below for reference.    Significant Diagnostic Studies: US Abdomen Limited RUQ (LIVER/GB)  Result Date: 12/08/2022 CLINICAL DATA:  Elevated LFTs EXAM: ULTRASOUND ABDOMEN LIMITED RIGHT UPPER QUADRANT COMPARISON:  None Available. FINDINGS: Gallbladder: There is layering sludge and small stones in the gallbladder with stones  measuring up to 5 mm. There is no gallbladder wall thickening or pericholecystic fluid. Sonographic Murphy's sign was reported negative. Common bile duct: Diameter: 3 mm Liver: Parenchymal echogenicity is diffusely increased. There are no focal lesions. There is no intrahepatic biliary ductal dilatation. Portal vein is patent on color Doppler imaging with normal direction of blood flow towards the liver. Other: There is no free fluid. IMPRESSION: 1. Cholelithiasis without evidence of acute cholecystitis. 2. Hepatic steatosis. Electronically Signed   By: Lesia Hausen M.D.   On: 12/08/2022 13:01   ECHOCARDIOGRAM COMPLETE  Result Date: 12/08/2022    ECHOCARDIOGRAM REPORT   Patient Name:   Marilyn Haas Date of Exam: 12/07/2022 Medical Rec #:  [id number]         Height:       61.0 in Accession #:    [id number]        Weight:       123.0 lb Date of Birth:  October 10, 1940        BSA:          1.536 m Patient Age:    77 years          BP:           124/78 mmHg Patient Gender: F                 HR:           87 bpm. Exam Location:  [facility] Procedure: 2D Echo, Cardiac Doppler and Color Doppler Indications:     R55 Syncope  History:         Patient has prior history of Echocardiogram examinations, most                  recent 02/18/2016.  Sonographer:     Daphine Deutscher RDCS Referring Phys:  [id number] Vernetta Honey MANSY Diagnosing Phys: Yvonne Kendall MD IMPRESSIONS  1. Left ventricular ejection fraction, by estimation, is >55%. The left ventricle has normal function. Left ventricular endocardial border not optimally defined to evaluate regional wall motion. Left ventricular diastolic parameters are consistent with Grade I diastolic dysfunction (impaired relaxation).  2. Right ventricular systolic function is mildly reduced.

## 2022-12-11 NOTE — TOC Transition Note (Signed)
Transition of Care [facility]) - CM/SW Discharge Note   Patient Details  Name: Marilyn Haas MRN: [id number] Date of Birth: May 27, 1940  Transition of Care [facility]) CM/SW Contact:  Darolyn Rua, LCSW Phone Number: 12/11/2022, 12:01 PM   Clinical Narrative:     Patient will DC to: [facility] Anticipated DC date: 12/11/22 Family notified: son Technical sales engineer byWendie Simmer  Per MD patient ready for DC to [facility] . RN, patient, patient's family, and facility notified of DC. Discharge Summary sent to facility. RN given number for report   [phone]. DC packet on chart. Ambulance transport requested for patient.  CSW signing off.    Final next level of care: Skilled Nursing Facility Barriers to Discharge: No Barriers Identified   Patient Goals and CMS Choice CMS Medicare.gov Compare Post Acute Care list provided to:: Patient Choice offered to / list presented to : Patient  Discharge Placement                         Discharge Plan and Services Additional resources added to the After Visit Summary for       Post Acute Care Choice: Skilled Nursing Facility                               Social Determinants of Health (SDOH) Interventions SDOH Screenings   Food Insecurity: No Food Insecurity (09/25/2022)  Housing: Low Risk  (09/25/2022)  Transportation Needs: No Transportation Needs (10/26/2022)  Utilities: Not At Risk (03/23/2022)  Alcohol Screen: Low Risk  (03/23/2022)  Depression (PHQ2-9): Medium Risk (03/23/2022)  Financial Resource Strain: Low Risk  (10/26/2022)  Physical Activity: Inactive (03/23/2022)  Social Connections: Socially Isolated (03/23/2022)  Stress: No Stress Concern Present (03/23/2022)  Tobacco Use: Medium Risk (12/07/2022)     Readmission Risk Interventions    12/07/2022   10:54 AM  Readmission Risk Prevention Plan  Transportation Screening Complete  PCP or Specialist Appt within 3-5 Days Complete  HRI or Home Care Consult Complete   Social Work Consult for Recovery Care Planning/Counseling Complete  Palliative Care Screening Not Applicable  Medication Review Oceanographer) Not Complete  Med Review Comments Will be reviewed by charge nurse upon discharge

## 2022-12-11 NOTE — Plan of Care (Signed)
  Problem: Education: Goal: Knowledge of condition and prescribed therapy will improve Outcome: Progressing   Problem: Cardiac: Goal: Will achieve and/or maintain adequate cardiac output Outcome: Progressing   Problem: Physical Regulation: Goal: Complications related to the disease process, condition or treatment will be avoided or minimized Outcome: Progressing   

## 2022-12-12 LAB — CULTURE, BLOOD (ROUTINE X 2)
Culture: NO GROWTH
Culture: NO GROWTH

## 2022-12-14 ENCOUNTER — Telehealth: Payer: Self-pay | Admitting: *Deleted

## 2022-12-14 NOTE — Progress Notes (Signed)
Care Coordination Note  12/14/2022 Name: Marilyn Haas MRN: [id number] DOB: 1941-01-05  Marilyn Haas is a 82 y.o. year old female who is a primary care patient of Elfredia Nevins, MD and is actively engaged with the care management team. I reached out to [place] by phone today to assist with re-scheduling a follow up visit with the RN Case Manager  Follow up plan: Unsuccessful telephone outreach attempt made. A HIPAA compliant phone message was left for the patient providing contact information and requesting a return call.   [facility]  Care Coordination Care Guide  Direct Dial: [phone]

## 2022-12-17 ENCOUNTER — Other Ambulatory Visit: Payer: Self-pay | Admitting: Cardiology

## 2022-12-17 ENCOUNTER — Other Ambulatory Visit: Payer: Self-pay | Admitting: Gastroenterology

## 2022-12-21 DIAGNOSIS — F331 Major depressive disorder, recurrent, moderate: Secondary | ICD-10-CM | POA: Diagnosis not present

## 2022-12-23 DIAGNOSIS — F32A Depression, unspecified: Secondary | ICD-10-CM | POA: Diagnosis not present

## 2022-12-24 DIAGNOSIS — R600 Localized edema: Secondary | ICD-10-CM | POA: Diagnosis not present

## 2022-12-28 DIAGNOSIS — F331 Major depressive disorder, recurrent, moderate: Secondary | ICD-10-CM | POA: Diagnosis not present

## 2022-12-28 DIAGNOSIS — F411 Generalized anxiety disorder: Secondary | ICD-10-CM | POA: Diagnosis not present

## 2022-12-31 DIAGNOSIS — R600 Localized edema: Secondary | ICD-10-CM | POA: Diagnosis not present

## 2023-01-04 DIAGNOSIS — F411 Generalized anxiety disorder: Secondary | ICD-10-CM | POA: Diagnosis not present

## 2023-01-04 DIAGNOSIS — F331 Major depressive disorder, recurrent, moderate: Secondary | ICD-10-CM | POA: Diagnosis not present

## 2023-01-11 DIAGNOSIS — F411 Generalized anxiety disorder: Secondary | ICD-10-CM | POA: Diagnosis not present

## 2023-01-11 DIAGNOSIS — F331 Major depressive disorder, recurrent, moderate: Secondary | ICD-10-CM | POA: Diagnosis not present

## 2023-01-14 DIAGNOSIS — G894 Chronic pain syndrome: Secondary | ICD-10-CM | POA: Diagnosis not present

## 2023-01-14 DIAGNOSIS — K922 Gastrointestinal hemorrhage, unspecified: Secondary | ICD-10-CM | POA: Diagnosis not present

## 2023-01-14 DIAGNOSIS — I10 Essential (primary) hypertension: Secondary | ICD-10-CM | POA: Diagnosis not present

## 2023-01-14 DIAGNOSIS — M150 Primary generalized (osteo)arthritis: Secondary | ICD-10-CM | POA: Diagnosis not present

## 2023-01-14 DIAGNOSIS — F419 Anxiety disorder, unspecified: Secondary | ICD-10-CM | POA: Diagnosis not present

## 2023-01-18 DIAGNOSIS — F411 Generalized anxiety disorder: Secondary | ICD-10-CM | POA: Diagnosis not present

## 2023-01-18 DIAGNOSIS — F331 Major depressive disorder, recurrent, moderate: Secondary | ICD-10-CM | POA: Diagnosis not present

## 2023-01-21 NOTE — Progress Notes (Deleted)
   Cardiology Office Note    Date:  01/21/2023  ID:  Marilyn, Haas 29-Jul-1940, MRN [id number] Cardiologist: Nona Dell, MD    History of Present Illness:    Marilyn Haas is a 82 y.o. female with past medical history of lower extremity edema, HLD, palpitations (PVC's by prior monitor) and breast cancer (s/p lumpectomy) who presents to the office today for overdue follow-up.  She was last examined by Dr. Diona Browner in 09/2021 and denied any recent chest pain or palpitations at that time. She was continued on her current cardiac medications with Losartan 25 mg twice daily and PRN Lasix.   In the interim, she was hospitalized in 01/2022 for a GI bleed and EGD showed a hiatal hernia and Cameron lesions and she was continued on PPI and Carafate. She was hospitalized again in 11/2022 for acute encephalopathy which was felt to be due to hyponatremia and possible anemia as well. Na+ was at 121 on admission and had improved to 127 at discharge. Did have progressive anemia during admission with hemoglobin as low as 6.0 and she underwent repeat GI evaluation and was found to have a duodenal ulcer with visible bleeding and treated with 3 hemostatic clips.  - CBC if not checked by PCP?  ROS: ***  Studies Reviewed:   EKG: EKG is*** ordered today and demonstrates ***   EKG Interpretation Date/Time:    Ventricular Rate:    PR Interval:    QRS Duration:    QT Interval:    QTC Calculation:   R Axis:      Text Interpretation:         Echocardiogram: 11/2022 IMPRESSIONS     1. Left ventricular ejection fraction, by estimation, is >55%. The left  ventricle has normal function. Left ventricular endocardial border not  optimally defined to evaluate regional wall motion. Left ventricular  diastolic parameters are consistent with  Grade I diastolic dysfunction (impaired relaxation).   2. Right ventricular systolic function is mildly reduced. The right  ventricular size is normal.    3. The mitral valve is grossly normal. No evidence of mitral valve  regurgitation. No evidence of mitral stenosis.   4. The aortic valve was not well visualized. Aortic valve regurgitation  is mild. No aortic stenosis is present.   Risk Assessment/Calculations:   {Does this patient have ATRIAL FIBRILLATION?:[phone]} No BP recorded.  {Refresh Note OR Click here to enter BP  :1}***         Physical Exam:   VS:  There were no vitals taken for this visit.   Wt Readings from Last 3 Encounters:  12/11/22 144 lb 10 oz (65.6 kg)  03/04/22 151 lb (68.5 kg)  02/26/22 154 lb 12.8 oz (70.2 kg)     GEN: Well nourished, well developed in no acute distress NECK: No JVD; No carotid bruits CARDIAC: ***RRR, no murmurs, rubs, gallops RESPIRATORY:  Clear to auscultation without rales, wheezing or rhonchi  ABDOMEN: Appears non-distended. No obvious abdominal masses. EXTREMITIES: No clubbing or cyanosis. No edema.  Distal pedal pulses are 2+ bilaterally.   Assessment and Plan:   1. Palpitations/PVC's - ***  2. HTN - ***  3. Lower Extremity Edema - ***  4. Anemia - She did have anemia with hemoglobin as low as 6.0 during her admission in 11/2022 and was found to have a duodenal ulcer with visible bleeding which was treated with hemostatic clips.   Signed, Ellsworth Lennox, PA-C

## 2023-01-22 ENCOUNTER — Encounter: Payer: Self-pay | Admitting: Student

## 2023-01-22 ENCOUNTER — Ambulatory Visit: Payer: Medicare Other | Admitting: Student

## 2023-01-22 ENCOUNTER — Ambulatory Visit: Payer: Medicare Other | Attending: Student | Admitting: Student

## 2023-01-22 VITALS — BP 140/70 | HR 99 | Ht 65.0 in | Wt 140.6 lb

## 2023-01-22 DIAGNOSIS — I10 Essential (primary) hypertension: Secondary | ICD-10-CM | POA: Insufficient documentation

## 2023-01-22 DIAGNOSIS — Z79899 Other long term (current) drug therapy: Secondary | ICD-10-CM | POA: Diagnosis not present

## 2023-01-22 DIAGNOSIS — D509 Iron deficiency anemia, unspecified: Secondary | ICD-10-CM | POA: Diagnosis not present

## 2023-01-22 DIAGNOSIS — R600 Localized edema: Secondary | ICD-10-CM | POA: Insufficient documentation

## 2023-01-22 DIAGNOSIS — I493 Ventricular premature depolarization: Secondary | ICD-10-CM | POA: Insufficient documentation

## 2023-01-22 MED ORDER — FUROSEMIDE 20 MG PO TABS: 20.0000 mg | ORAL_TABLET | Freq: Every day | ORAL | 11 refills | Status: DC

## 2023-01-22 NOTE — Patient Instructions (Signed)
Medication Instructions:   Start Lasix 20 mg Daily   *If you need a refill on your cardiac medications before your next appointment, please call your pharmacy*   Lab Work: Your physician recommends that you return for lab work in: 2 Weeks ( BMET, CBC)   If you have labs (blood work) drawn today and your tests are completely normal, you will receive your results only by: MyChart Message (if you have MyChart) OR A paper copy in the mail If you have any lab test that is abnormal or we need to change your treatment, we will call you to review the results.   Testing/Procedures: NONE    Follow-Up: At [facility], you and your health needs are our priority.  As part of our continuing mission to provide you with exceptional heart care, we have created designated Provider Care Teams.  These Care Teams include your primary Cardiologist (physician) and Advanced Practice Providers (APPs -  Physician Assistants and Nurse Practitioners) who all work together to provide you with the care you need, when you need it.  We recommend signing up for the patient portal called "MyChart".  Sign up information is provided on this After Visit Summary.  MyChart is used to connect with patients for Virtual Visits (Telemedicine).  Patients are able to view lab/test results, encounter notes, upcoming appointments, etc.  Non-urgent messages can be sent to your provider as well.   To learn more about what you can do with MyChart, go to [email or web address].    Your next appointment:   6 month(s)  Provider:   You may see Nona Dell, MD or one of the following Advanced Practice Providers on your designated Care Team:   Randall An, PA-C  Jacolyn Reedy, PA-C     Other Instructions Thank you for choosing [facility]!

## 2023-01-22 NOTE — Progress Notes (Signed)
Cardiology Office Note    Date:  01/22/2023  ID:  Marilyn Haas, Marilyn Haas 1940/09/11, MRN [id number] Cardiologist: Nona Dell, MD    History of Present Illness:    Marilyn Haas is a 82 y.o. female with past medical history of lower extremity edema, HLD, palpitations (PVC's by prior monitor) and breast cancer (s/p lumpectomy) who presents to the office today for overdue follow-up.   She was last examined by Dr. Diona Browner in 09/2021 and denied any recent chest pain or palpitations at that time. She was continued on her current cardiac medications with Losartan 25 mg twice daily and PRN Lasix.    In the interim, she was hospitalized in 01/2022 for a GI bleed and EGD showed a hiatal hernia and Cameron lesions and she was continued on PPI and Carafate. She was hospitalized again in 11/2022 for acute encephalopathy which was felt to be due to hyponatremia and possible anemia as well. Na+ was at 121 on admission and had improved to 127 at discharge. Did have progressive anemia during admission with hemoglobin as low as 6.0 and she underwent repeat GI evaluation and was found to have a duodenal ulcer with visible bleeding and treated with 3 hemostatic clips.   In talking the patient today, she reports being at [facility] for rehab. She does not recall a lot of what happened during her admission in 11/2022 and she is unaware that she had anemia at that time. She denies any recurrent melena, hematochezia or hematuria. She is in a wheelchair today but says she has been participating in physical therapy several times a day. Denies any specific chest pain or dyspnea exertion with this. No recent orthopnea or PND. She has experienced worsening lower extremity edema and by review of the [facility] sent from SNF, she was previously receiving Lasix in 12/2022 but this was discontinued in the interim.   Studies Reviewed:   EKG: EKG is ordered today.  Echocardiogram: 11/2022 IMPRESSIONS     1. Left  ventricular ejection fraction, by estimation, is >55%. The left  ventricle has normal function. Left ventricular endocardial border not  optimally defined to evaluate regional wall motion. Left ventricular  diastolic parameters are consistent with  Grade I diastolic dysfunction (impaired relaxation).   2. Right ventricular systolic function is mildly reduced. The right  ventricular size is normal.   3. The mitral valve is grossly normal. No evidence of mitral valve  regurgitation. No evidence of mitral stenosis.   4. The aortic valve was not well visualized. Aortic valve regurgitation  is mild. No aortic stenosis is present.    Physical Exam:   VS:  BP (!) 140/70   Pulse 99   Ht 5\' 5"  (1.651 m)   Wt 140 lb 9.6 oz (63.8 kg)   SpO2 98%   BMI 23.40 kg/m    Wt Readings from Last 3 Encounters:  01/22/23 140 lb 9.6 oz (63.8 kg)  12/11/22 144 lb 10 oz (65.6 kg)  03/04/22 151 lb (68.5 kg)     GEN: Pleasant, elderly female appearing in no acute distress NECK: No JVD; No carotid bruits CARDIAC: RRR, no murmurs, rubs, gallops RESPIRATORY:  Clear to auscultation without rales, wheezing or rhonchi  ABDOMEN: Appears non-distended. No obvious abdominal masses. EXTREMITIES: No clubbing or cyanosis. 1+ pitting edema up to knees bilaterally.  Distal pedal pulses are 2+ bilaterally.   Assessment and Plan:   1. Palpitations/PVC's - She denies any recent palpitations and previously preferred conservative management.  She has not required the use of AV nodal blocking agents.   2. HTN - BP is at 140/70 during today's visit which is acceptable given her age. This has been well-controlled when checked at SNF with SBP typically in the 120's to 130's.  Continue current medical therapy with Losartan 25 mg twice daily..   3. Lower Extremity Edema - She does have 1+ pitting edema on examination. Suspect dependent edema is playing a role as she is in a wheelchair throughout a majority of the day.  Echocardiogram last month showed a preserved EF as outlined above with grade 1 diastolic dysfunction and RV function was mildly reduced. Will restart Lasix at 20 mg daily. Repeat BMET in 2 weeks.   4. Anemia - She did have anemia with hemoglobin as low as 6.0 during her admission in 11/2022 and was found to have a duodenal ulcer with visible bleeding which was treated with hemostatic clips. Will request a follow-up CBC with upcoming labs in 2 weeks.   Signed, Ellsworth Lennox, PA-C

## 2023-01-25 DIAGNOSIS — R600 Localized edema: Secondary | ICD-10-CM | POA: Diagnosis not present

## 2023-01-25 DIAGNOSIS — I509 Heart failure, unspecified: Secondary | ICD-10-CM | POA: Diagnosis not present

## 2023-01-25 DIAGNOSIS — F411 Generalized anxiety disorder: Secondary | ICD-10-CM | POA: Diagnosis not present

## 2023-01-25 DIAGNOSIS — F331 Major depressive disorder, recurrent, moderate: Secondary | ICD-10-CM | POA: Diagnosis not present

## 2023-02-01 DIAGNOSIS — J069 Acute upper respiratory infection, unspecified: Secondary | ICD-10-CM | POA: Diagnosis not present

## 2023-02-01 DIAGNOSIS — F331 Major depressive disorder, recurrent, moderate: Secondary | ICD-10-CM | POA: Diagnosis not present

## 2023-02-01 DIAGNOSIS — U071 COVID-19: Secondary | ICD-10-CM | POA: Diagnosis not present

## 2023-02-01 DIAGNOSIS — F411 Generalized anxiety disorder: Secondary | ICD-10-CM | POA: Diagnosis not present

## 2023-02-03 DIAGNOSIS — J069 Acute upper respiratory infection, unspecified: Secondary | ICD-10-CM | POA: Diagnosis not present

## 2023-02-03 DIAGNOSIS — U071 COVID-19: Secondary | ICD-10-CM | POA: Diagnosis not present

## 2023-02-08 DIAGNOSIS — F411 Generalized anxiety disorder: Secondary | ICD-10-CM | POA: Diagnosis not present

## 2023-02-08 DIAGNOSIS — U071 COVID-19: Secondary | ICD-10-CM | POA: Diagnosis not present

## 2023-02-08 DIAGNOSIS — F331 Major depressive disorder, recurrent, moderate: Secondary | ICD-10-CM | POA: Diagnosis not present

## 2023-02-10 DIAGNOSIS — F329 Major depressive disorder, single episode, unspecified: Secondary | ICD-10-CM | POA: Diagnosis not present

## 2023-02-10 DIAGNOSIS — L89152 Pressure ulcer of sacral region, stage 2: Secondary | ICD-10-CM | POA: Diagnosis not present

## 2023-02-10 DIAGNOSIS — M6281 Muscle weakness (generalized): Secondary | ICD-10-CM | POA: Diagnosis not present

## 2023-02-10 DIAGNOSIS — D649 Anemia, unspecified: Secondary | ICD-10-CM | POA: Diagnosis not present

## 2023-02-10 DIAGNOSIS — C50919 Malignant neoplasm of unspecified site of unspecified female breast: Secondary | ICD-10-CM | POA: Diagnosis not present

## 2023-02-10 DIAGNOSIS — K269 Duodenal ulcer, unspecified as acute or chronic, without hemorrhage or perforation: Secondary | ICD-10-CM | POA: Diagnosis not present

## 2023-02-10 DIAGNOSIS — I509 Heart failure, unspecified: Secondary | ICD-10-CM | POA: Diagnosis not present

## 2023-02-10 DIAGNOSIS — R5381 Other malaise: Secondary | ICD-10-CM | POA: Diagnosis not present

## 2023-02-15 DIAGNOSIS — F331 Major depressive disorder, recurrent, moderate: Secondary | ICD-10-CM | POA: Diagnosis not present

## 2023-02-15 DIAGNOSIS — F411 Generalized anxiety disorder: Secondary | ICD-10-CM | POA: Diagnosis not present

## 2023-02-19 DIAGNOSIS — F32A Depression, unspecified: Secondary | ICD-10-CM | POA: Diagnosis not present

## 2023-02-19 DIAGNOSIS — G4700 Insomnia, unspecified: Secondary | ICD-10-CM | POA: Diagnosis not present

## 2023-02-21 DIAGNOSIS — F329 Major depressive disorder, single episode, unspecified: Secondary | ICD-10-CM | POA: Diagnosis not present

## 2023-02-21 DIAGNOSIS — C50919 Malignant neoplasm of unspecified site of unspecified female breast: Secondary | ICD-10-CM | POA: Diagnosis not present

## 2023-02-21 DIAGNOSIS — M6281 Muscle weakness (generalized): Secondary | ICD-10-CM | POA: Diagnosis not present

## 2023-02-21 DIAGNOSIS — R5381 Other malaise: Secondary | ICD-10-CM | POA: Diagnosis not present

## 2023-02-21 DIAGNOSIS — I509 Heart failure, unspecified: Secondary | ICD-10-CM | POA: Diagnosis not present

## 2023-03-12 ENCOUNTER — Ambulatory Visit (INDEPENDENT_AMBULATORY_CARE_PROVIDER_SITE_OTHER): Payer: Self-pay | Admitting: Audiology

## 2023-03-12 DIAGNOSIS — F419 Anxiety disorder, unspecified: Secondary | ICD-10-CM | POA: Diagnosis not present

## 2023-03-12 DIAGNOSIS — I10 Essential (primary) hypertension: Secondary | ICD-10-CM | POA: Diagnosis not present

## 2023-03-12 DIAGNOSIS — H9193 Unspecified hearing loss, bilateral: Secondary | ICD-10-CM

## 2023-03-12 DIAGNOSIS — Z6822 Body mass index (BMI) 22.0-22.9, adult: Secondary | ICD-10-CM | POA: Diagnosis not present

## 2023-03-12 DIAGNOSIS — F330 Major depressive disorder, recurrent, mild: Secondary | ICD-10-CM | POA: Diagnosis not present

## 2023-03-12 DIAGNOSIS — F5101 Primary insomnia: Secondary | ICD-10-CM | POA: Diagnosis not present

## 2023-03-12 DIAGNOSIS — G894 Chronic pain syndrome: Secondary | ICD-10-CM | POA: Diagnosis not present

## 2023-03-12 DIAGNOSIS — M150 Primary generalized (osteo)arthritis: Secondary | ICD-10-CM | POA: Diagnosis not present

## 2023-03-12 NOTE — Progress Notes (Signed)
  [address], [address] [place], [place] [address] [phone]  Hearing Aid Check     Marilyn Haas walked in to the clinic asking for a left hearing aid repair.   The left hearing aid is an Audeo Haas-R which was checked and the receiver wire was broken. Replaced the receiver wire that we had on stock with a new one (1xS) and placed a new double dome and retention wire.   Listening check demonstrated good function after the repair.    Services fee: $30 was paid at checkout for the services rendered as described above on a device that is out of warranty.  Patient's Haas was oriented about how to clean the charger given that he reported issues with the charger. At the moment the clinic does not have a contract with the hearing aid manufacturer and cannot order one for him or have one in stock. He could call nearby clinics and ask them if they can order a charger for the Marilyn Haas hearing aids.   Recommend: Return for a hearing aid check , as needed. Return for a hearing evaluation and to see an ENT, if concerns with hearing changes arise.    Brando Taves MARIE LEROUX-MARTINEZ, AUD

## 2023-03-22 DIAGNOSIS — C50411 Malignant neoplasm of upper-outer quadrant of right female breast: Secondary | ICD-10-CM | POA: Diagnosis not present

## 2023-03-22 DIAGNOSIS — I509 Heart failure, unspecified: Secondary | ICD-10-CM | POA: Diagnosis not present

## 2023-03-22 DIAGNOSIS — U071 COVID-19: Secondary | ICD-10-CM | POA: Diagnosis not present

## 2023-03-22 DIAGNOSIS — I110 Hypertensive heart disease with heart failure: Secondary | ICD-10-CM | POA: Diagnosis not present

## 2023-03-30 ENCOUNTER — Ambulatory Visit: Payer: Medicare Other | Admitting: Student

## 2023-04-08 ENCOUNTER — Other Ambulatory Visit: Payer: Self-pay | Admitting: Hematology and Oncology

## 2023-04-08 ENCOUNTER — Other Ambulatory Visit: Payer: Self-pay | Admitting: Cardiology

## 2023-05-07 DIAGNOSIS — M150 Primary generalized (osteo)arthritis: Secondary | ICD-10-CM | POA: Diagnosis not present

## 2023-05-07 DIAGNOSIS — G894 Chronic pain syndrome: Secondary | ICD-10-CM | POA: Diagnosis not present

## 2023-05-07 DIAGNOSIS — T8484XA Pain due to internal orthopedic prosthetic devices, implants and grafts, initial encounter: Secondary | ICD-10-CM | POA: Diagnosis not present

## 2023-05-07 DIAGNOSIS — I10 Essential (primary) hypertension: Secondary | ICD-10-CM | POA: Diagnosis not present

## 2023-06-04 DIAGNOSIS — M150 Primary generalized (osteo)arthritis: Secondary | ICD-10-CM | POA: Diagnosis not present

## 2023-06-04 DIAGNOSIS — F419 Anxiety disorder, unspecified: Secondary | ICD-10-CM | POA: Diagnosis not present

## 2023-06-04 DIAGNOSIS — G894 Chronic pain syndrome: Secondary | ICD-10-CM | POA: Diagnosis not present

## 2023-06-04 DIAGNOSIS — I10 Essential (primary) hypertension: Secondary | ICD-10-CM | POA: Diagnosis not present

## 2023-07-01 ENCOUNTER — Other Ambulatory Visit: Payer: Self-pay | Admitting: Gastroenterology

## 2023-07-01 DIAGNOSIS — M150 Primary generalized (osteo)arthritis: Secondary | ICD-10-CM | POA: Diagnosis not present

## 2023-07-01 DIAGNOSIS — I10 Essential (primary) hypertension: Secondary | ICD-10-CM | POA: Diagnosis not present

## 2023-07-01 DIAGNOSIS — G894 Chronic pain syndrome: Secondary | ICD-10-CM | POA: Diagnosis not present

## 2023-07-01 DIAGNOSIS — C50411 Malignant neoplasm of upper-outer quadrant of right female breast: Secondary | ICD-10-CM | POA: Diagnosis not present

## 2023-07-01 DIAGNOSIS — F419 Anxiety disorder, unspecified: Secondary | ICD-10-CM | POA: Diagnosis not present

## 2023-07-01 DIAGNOSIS — Z6821 Body mass index (BMI) 21.0-21.9, adult: Secondary | ICD-10-CM | POA: Diagnosis not present

## 2023-07-07 ENCOUNTER — Other Ambulatory Visit (HOSPITAL_COMMUNITY): Payer: Self-pay | Admitting: Internal Medicine

## 2023-07-07 DIAGNOSIS — Z853 Personal history of malignant neoplasm of breast: Secondary | ICD-10-CM

## 2023-07-22 ENCOUNTER — Ambulatory Visit (INDEPENDENT_AMBULATORY_CARE_PROVIDER_SITE_OTHER): Payer: Self-pay | Admitting: Audiology

## 2023-07-22 DIAGNOSIS — H9193 Unspecified hearing loss, bilateral: Secondary | ICD-10-CM

## 2023-07-27 NOTE — Progress Notes (Signed)
  [address], [address] [place], [place] [address] [phone]  Hearing Aid Check     Dalexa  H Reffner's son walked in to the clinic to drop off her left hearing aid because it is not working.     Right Left  Hearing aid manufacturer Davey Erp Z30Q SN: [id number] Davey Erp [id number] SN: [id number]  Hearing aid style Receiver in the canal Receiver in the canal  Hearing aid battery rechargeable rechargeable  Receiver     Dome/ custom earpiece    Retention wire    Warranty expiration date 03-31-2018 03-31-2018  Loss and Damage    Additional accessories Expiration date    Initial fitting date 01-09-2016 01-09-2016  Device was fit at: [facility]       Actions taken: Inspection of the device and listening check showed that the was filter was clogged. Changed the wax filter, dome and retention wire. Also cleaned the microphones..  Of note- patient may have her physician look in her ear to rule out her ear being full of wax.  Margretta Shi called her son on 07-27-2023 to let him know it is ready to be picked up for a $30 fee.    Recommend: Return for a hearing aid check , as needed. Return for a hearing evaluation and to see an ENT, if concerns with hearing changes arise.    Larae Caison MARIE LEROUX-MARTINEZ, AUD

## 2023-07-29 ENCOUNTER — Ambulatory Visit (INDEPENDENT_AMBULATORY_CARE_PROVIDER_SITE_OTHER): Payer: Self-pay | Admitting: Audiology

## 2023-07-29 DIAGNOSIS — H903 Sensorineural hearing loss, bilateral: Secondary | ICD-10-CM

## 2023-07-29 NOTE — Progress Notes (Signed)
  [address] [place], [place] [address] [phone]  Hearing Aid Check     Stephie  H Mcneece's son came to pick up Tyler's left (?) hearing aid after it was repaired.        Right Left  Hearing aid manufacturer Davey Erp V78I SN: [id number] Davey Erp B50R SN: [id number]  Hearing aid style Receiver in the canal Receiver in the canal  Hearing aid battery rechargeable rechargeable  Receiver      Dome/ custom earpiece      Retention wire      Warranty expiration date 03-31-2018 03-31-2018  Loss and Damage      Additional accessories Expiration date      Initial fitting date 01-09-2016 01-09-2016  Device was fit at: [facility]           Services fee: $30 was paid at checkout.     Recommend: Return for a hearing aid check , as needed. Return for a hearing evaluation and to see an ENT, if concerns with hearing changes arise.    Bud Kaeser MARIE LEROUX-MARTINEZ, AUD

## 2023-07-30 DIAGNOSIS — F419 Anxiety disorder, unspecified: Secondary | ICD-10-CM | POA: Diagnosis not present

## 2023-07-30 DIAGNOSIS — I10 Essential (primary) hypertension: Secondary | ICD-10-CM | POA: Diagnosis not present

## 2023-07-30 DIAGNOSIS — G894 Chronic pain syndrome: Secondary | ICD-10-CM | POA: Diagnosis not present

## 2023-07-30 DIAGNOSIS — F5101 Primary insomnia: Secondary | ICD-10-CM | POA: Diagnosis not present

## 2023-08-27 ENCOUNTER — Other Ambulatory Visit: Payer: Self-pay | Admitting: Hematology and Oncology

## 2023-08-27 DIAGNOSIS — E538 Deficiency of other specified B group vitamins: Secondary | ICD-10-CM | POA: Diagnosis not present

## 2023-08-27 DIAGNOSIS — Z0001 Encounter for general adult medical examination with abnormal findings: Secondary | ICD-10-CM | POA: Diagnosis not present

## 2023-08-27 DIAGNOSIS — G9332 Myalgic encephalomyelitis/chronic fatigue syndrome: Secondary | ICD-10-CM | POA: Diagnosis not present

## 2023-08-27 DIAGNOSIS — I10 Essential (primary) hypertension: Secondary | ICD-10-CM | POA: Diagnosis not present

## 2023-08-27 DIAGNOSIS — E559 Vitamin D deficiency, unspecified: Secondary | ICD-10-CM | POA: Diagnosis not present

## 2023-08-27 DIAGNOSIS — Z9229 Personal history of other drug therapy: Secondary | ICD-10-CM | POA: Diagnosis not present

## 2023-08-27 DIAGNOSIS — D518 Other vitamin B12 deficiency anemias: Secondary | ICD-10-CM | POA: Diagnosis not present

## 2023-09-06 DIAGNOSIS — G894 Chronic pain syndrome: Secondary | ICD-10-CM | POA: Diagnosis not present

## 2023-09-06 DIAGNOSIS — E871 Hypo-osmolality and hyponatremia: Secondary | ICD-10-CM | POA: Diagnosis not present

## 2023-09-06 DIAGNOSIS — F330 Major depressive disorder, recurrent, mild: Secondary | ICD-10-CM | POA: Diagnosis not present

## 2023-09-06 DIAGNOSIS — T50905A Adverse effect of unspecified drugs, medicaments and biological substances, initial encounter: Secondary | ICD-10-CM | POA: Diagnosis not present

## 2023-09-07 ENCOUNTER — Inpatient Hospital Stay (HOSPITAL_COMMUNITY): Admission: RE | Admit: 2023-09-07 | Source: Ambulatory Visit

## 2023-09-07 ENCOUNTER — Ambulatory Visit (HOSPITAL_COMMUNITY)

## 2023-10-04 DIAGNOSIS — M150 Primary generalized (osteo)arthritis: Secondary | ICD-10-CM | POA: Diagnosis not present

## 2023-10-04 DIAGNOSIS — E871 Hypo-osmolality and hyponatremia: Secondary | ICD-10-CM | POA: Diagnosis not present

## 2023-10-04 DIAGNOSIS — I10 Essential (primary) hypertension: Secondary | ICD-10-CM | POA: Diagnosis not present

## 2023-10-04 DIAGNOSIS — Z6820 Body mass index (BMI) 20.0-20.9, adult: Secondary | ICD-10-CM | POA: Diagnosis not present

## 2023-10-04 DIAGNOSIS — T50905A Adverse effect of unspecified drugs, medicaments and biological substances, initial encounter: Secondary | ICD-10-CM | POA: Diagnosis not present

## 2023-10-04 DIAGNOSIS — G894 Chronic pain syndrome: Secondary | ICD-10-CM | POA: Diagnosis not present

## 2023-10-04 DIAGNOSIS — T8484XA Pain due to internal orthopedic prosthetic devices, implants and grafts, initial encounter: Secondary | ICD-10-CM | POA: Diagnosis not present

## 2023-10-26 ENCOUNTER — Encounter (HOSPITAL_COMMUNITY)

## 2023-10-26 ENCOUNTER — Ambulatory Visit (HOSPITAL_COMMUNITY)

## 2023-10-26 ENCOUNTER — Encounter (HOSPITAL_COMMUNITY): Payer: Self-pay

## 2023-11-04 DIAGNOSIS — I10 Essential (primary) hypertension: Secondary | ICD-10-CM | POA: Diagnosis not present

## 2023-11-04 DIAGNOSIS — G894 Chronic pain syndrome: Secondary | ICD-10-CM | POA: Diagnosis not present

## 2023-11-04 DIAGNOSIS — E871 Hypo-osmolality and hyponatremia: Secondary | ICD-10-CM | POA: Diagnosis not present

## 2023-11-04 DIAGNOSIS — M150 Primary generalized (osteo)arthritis: Secondary | ICD-10-CM | POA: Diagnosis not present

## 2023-11-04 DIAGNOSIS — F419 Anxiety disorder, unspecified: Secondary | ICD-10-CM | POA: Diagnosis not present

## 2023-11-19 IMAGING — MG DIGITAL DIAGNOSTIC BILAT W/ TOMO W/ CAD
8 of 13 series · 8 of 37 positions shown · non-contrast
Comparison: Previous exam(s).

CLINICAL DATA: 80-year-old female for annual follow-up. History of
RIGHT breast cancer and lumpectomy in 6411.

EXAM:
DIGITAL DIAGNOSTIC BILATERAL MAMMOGRAM WITH TOMOSYNTHESIS AND CAD;
ULTRASOUND LEFT BREAST LIMITED
TECHNIQUE: Bilateral digital diagnostic mammography and breast tomosynthesis
was performed. The images were evaluated with computer-aided
detection.; Targeted ultrasound examination of the left breast was
performed.

[R MLO]
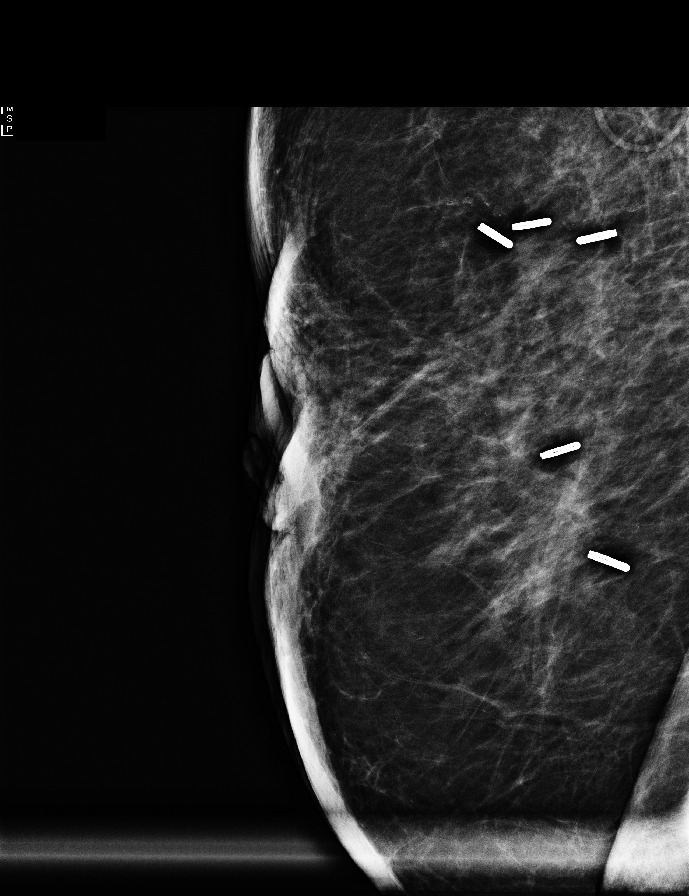

[R MLO synth-2D]
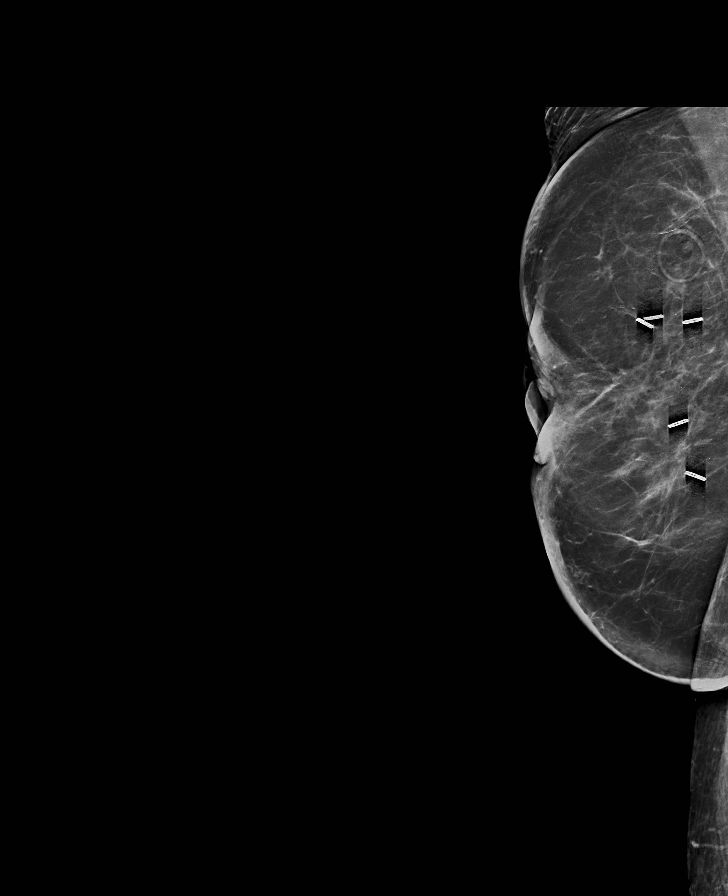

[L CC synth-2D (1 of 2)]
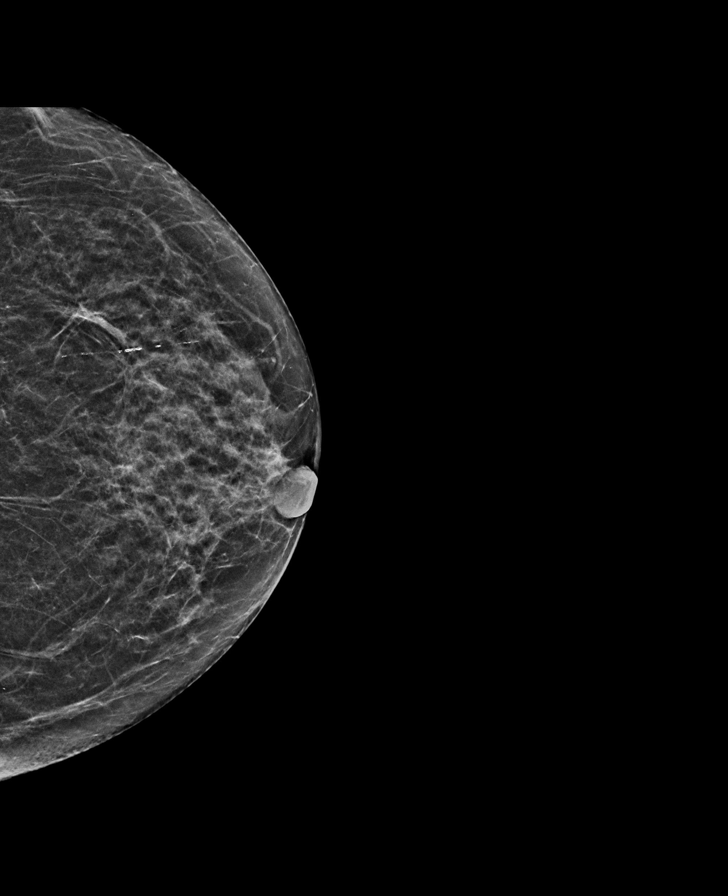

[L CC synth-2D (2 of 2)]
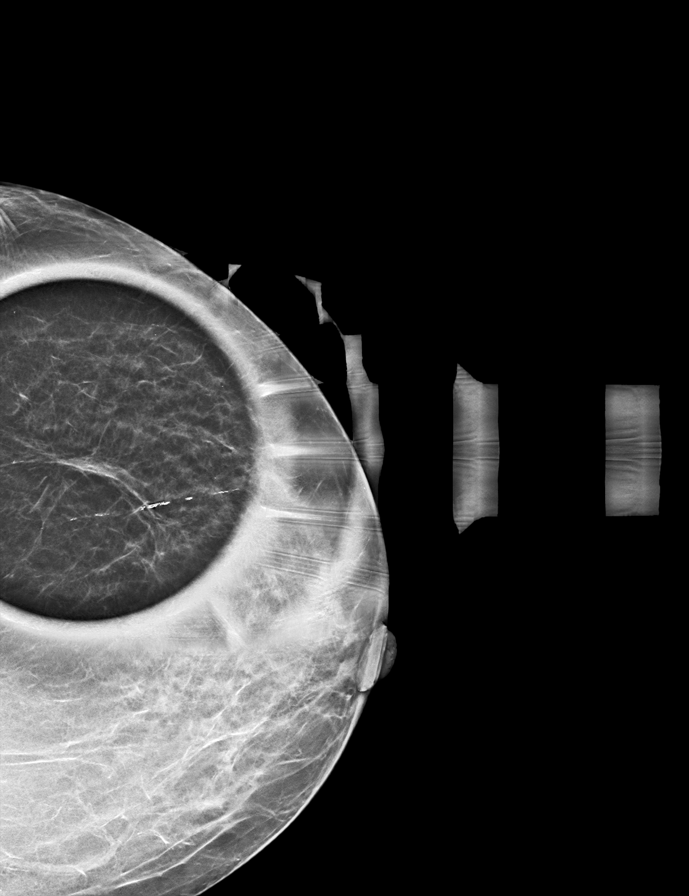

[R CC synth-2D]
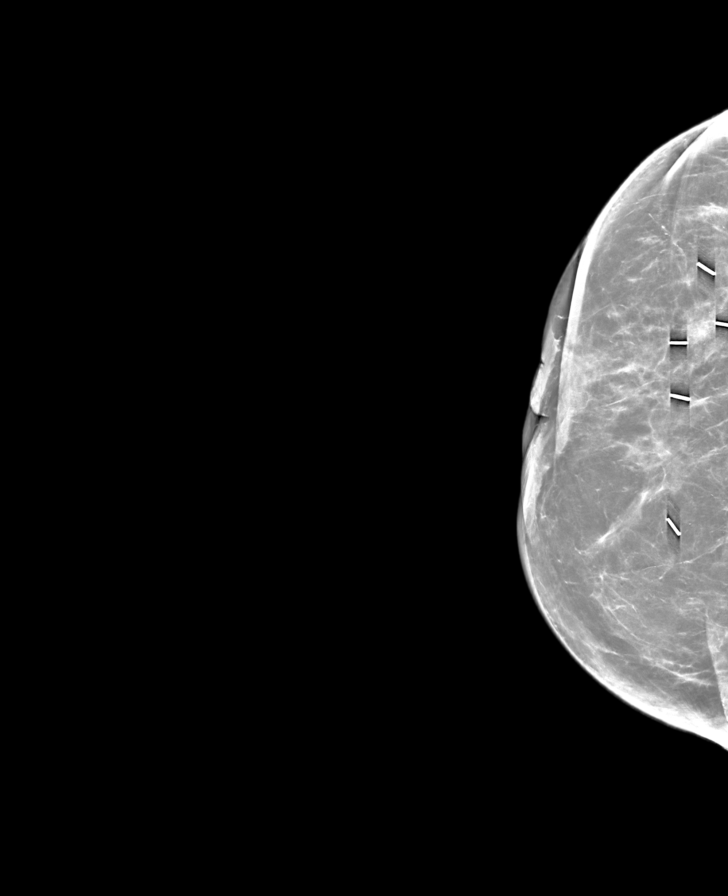

[L MLO synth-2D (1 of 2)]
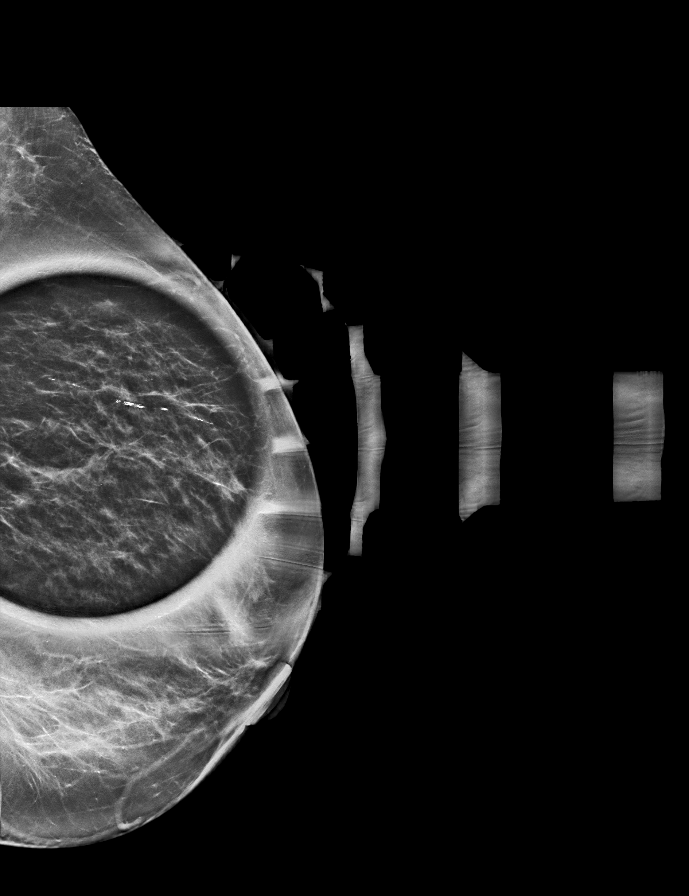

[L MLO synth-2D (2 of 2)]
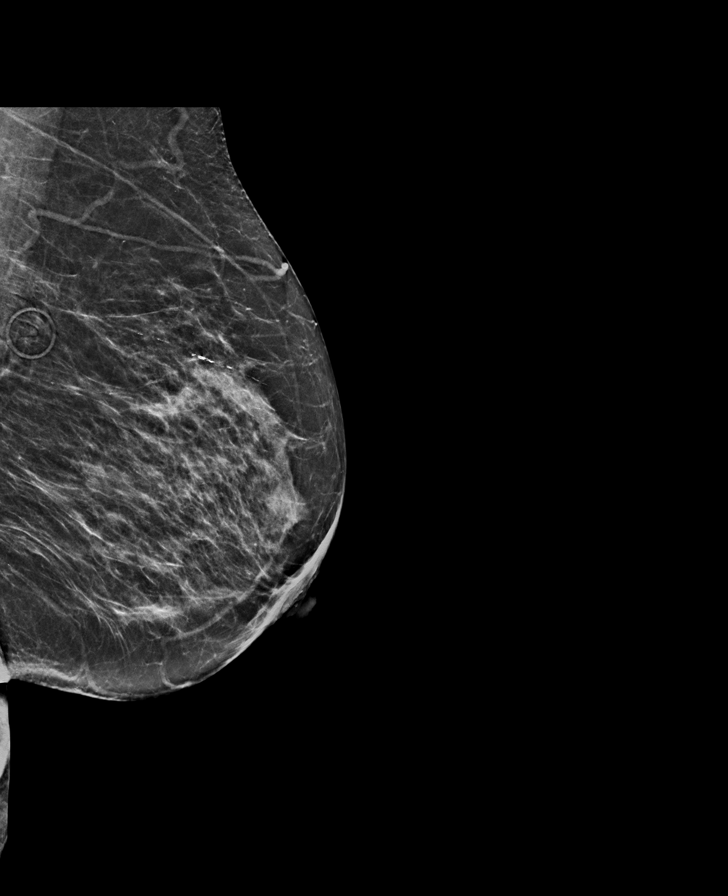

[L CC tomo · tomo slice 24/47.0]
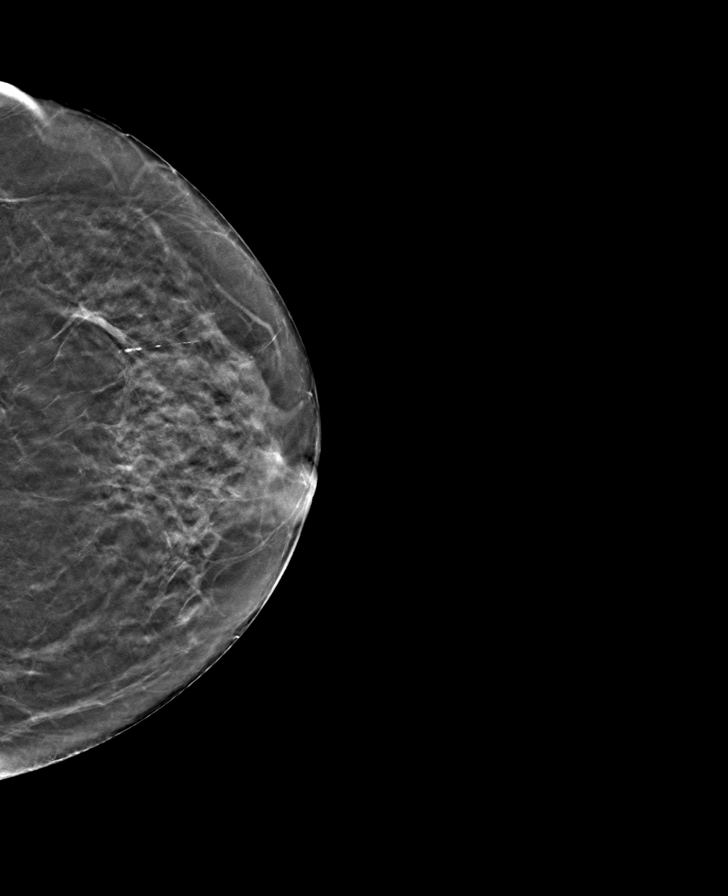

[8 of 37 positions shown; findings below may reference images not displayed]

ACR Breast Density Category c: The breast tissue is heterogeneously
dense, which may obscure small masses.
FINDINGS: Full field views of both breasts, a magnification view of the RIGHT
lumpectomy site and spot compression views of the LEFT breast are
performed.

A new focal asymmetry with possible subtle distortion is identified
within the within the UPPER-OUTER LEFT breast.

Lumpectomy changes within the RIGHT breast are again noted.

No other new or suspicious findings are noted within either breast.

Targeted ultrasound is performed, showing no sonographic correlate
to the focal asymmetry/possible subtle distortion within the
UPPER-OUTER LEFT breast.

No abnormal LEFT axillary lymph nodes are noted.
IMPRESSION: 1. Focal asymmetry/possible distortion within the UPPER-OUTER LEFT
breast, without sonographic correlate. Tissue sampling is
recommended.
2. No abnormal appearing LEFT axillary lymph nodes.
3. RIGHT lumpectomy changes without suspicious RIGHT breast
findings.

RECOMMENDATION:
3D/stereotactic guided LEFT breast biopsy, which will be arranged.

I have discussed the findings and recommendations with the patient.
If applicable, a reminder letter will be sent to the patient
regarding the next appointment.

BI-RADS CATEGORY  4: Suspicious.

## 2023-12-29 ENCOUNTER — Encounter (INDEPENDENT_AMBULATORY_CARE_PROVIDER_SITE_OTHER): Payer: Self-pay | Admitting: Gastroenterology

## 2024-01-27 ENCOUNTER — Encounter: Payer: Self-pay | Admitting: Cardiology

## 2024-01-27 ENCOUNTER — Ambulatory Visit: Attending: Cardiology | Admitting: Cardiology

## 2024-01-27 VITALS — BP 138/88 | HR 86 | Ht 65.0 in | Wt 131.8 lb

## 2024-01-27 DIAGNOSIS — I493 Ventricular premature depolarization: Secondary | ICD-10-CM | POA: Insufficient documentation

## 2024-01-27 DIAGNOSIS — I10 Essential (primary) hypertension: Secondary | ICD-10-CM | POA: Insufficient documentation

## 2024-01-27 NOTE — Progress Notes (Signed)
    Cardiology Office Note  Date: 01/27/2024   ID: Leola  LYNNETT LANGLINAIS, DOB [birth year], MRN [id number]  History of Present Illness: Marilyn Haas is an 83 y.o. female last seen in November 2024 by Ms. Strader PA-C, I reviewed her note.  She is here today with her son for a follow-up visit.  She is living at home, has assistance from her son.  She does not report any sense of palpitations, has not had any recent falls, using a rollator to get around.  I reviewed her medications.  Blood pressure control reasonable today on Cozaar  25 mg twice daily.  She is no longer on Lasix  and reports no significant trouble with leg edema.  PCP is now Dr. Orpha following the retirement of Dr. Bertell.  I reviewed her ECG today which shows normal sinus rhythm with increased voltage.  Physical Exam: VS:  BP 138/88   Pulse 86   Ht 5' 5 (1.651 m)   Wt 131 lb 12.8 oz (59.8 kg)   SpO2 97%   BMI 21.93 kg/m , BMI Body mass index is 21.93 kg/m.  Wt Readings from Last 3 Encounters:  01/27/24 131 lb 12.8 oz (59.8 kg)  01/22/23 140 lb 9.6 oz (63.8 kg)  12/11/22 144 lb 10 oz (65.6 kg)    General: Patient appears comfortable at rest. HEENT: Conjunctiva and lids normal. Neck: Supple, no elevated JVP or carotid bruits. Lungs: Clear to auscultation, nonlabored breathing at rest. Cardiac: Regular rate and rhythm, no S3, 1/6 systolic murmur.  ECG:  An ECG dated 12/07/2022 was personally reviewed today and demonstrated:  Sinus tachycardia.  Labwork:  September 2024: Potassium 3.7, BUN 16, creatinine 0.77, GFR 60, hemoglobin 8, platelets 186  Other Studies Reviewed Today:  No interval cardiac testing for review today.  Assessment and Plan:  1.  Palpitations with previously documented PVCs.  Quiescent at this point, ECG today shows normal sinus rhythm.  She is not on any AV nodal blockers and we plan to continue with observation at this time.  2.  Primary hypertension.  Plan to continue Cozaar  25 mg  twice daily.  No changes were made in current dose.  Keep follow-up with PCP.  3.  History of anemia and prior GI bleed due to duodenal ulcer.  Disposition:  Follow up 1 year.  Signed, Jayson JUDITHANN Sierras, M.D., F.A.C.C. [facility] at [facility]

## 2024-01-27 NOTE — Patient Instructions (Signed)
 Medication Instructions:  Your physician recommends that you continue on your current medications as directed. Please refer to the Current Medication list given to you today.   Labwork: None today  Testing/Procedures: None today  Follow-Up: 1 year  Any Other Special Instructions Will Be Listed Below (If Applicable).  If you need a refill on your cardiac medications before your next appointment, please call your pharmacy.

## 2024-03-30 ENCOUNTER — Ambulatory Visit: Payer: Self-pay

## 2024-04-03 ENCOUNTER — Encounter (HOSPITAL_COMMUNITY): Payer: Self-pay | Admitting: Emergency Medicine

## 2024-04-03 ENCOUNTER — Inpatient Hospital Stay (HOSPITAL_COMMUNITY)
Admission: EM | Admit: 2024-04-03 | Discharge: 2024-04-14 | DRG: 640 | Disposition: A | Discharge status: Skilled Nursing Facility | Attending: Hospitalist | Admitting: Hospitalist

## 2024-04-03 ENCOUNTER — Emergency Department (HOSPITAL_COMMUNITY)

## 2024-04-03 ENCOUNTER — Other Ambulatory Visit: Payer: Self-pay

## 2024-04-03 DIAGNOSIS — Z79810 Long term (current) use of selective estrogen receptor modulators (SERMs): Secondary | ICD-10-CM

## 2024-04-03 DIAGNOSIS — Z8673 Personal history of transient ischemic attack (TIA), and cerebral infarction without residual deficits: Secondary | ICD-10-CM

## 2024-04-03 DIAGNOSIS — E876 Hypokalemia: Secondary | ICD-10-CM | POA: Diagnosis present

## 2024-04-03 DIAGNOSIS — M810 Age-related osteoporosis without current pathological fracture: Secondary | ICD-10-CM | POA: Diagnosis present

## 2024-04-03 DIAGNOSIS — R296 Repeated falls: Secondary | ICD-10-CM | POA: Diagnosis present

## 2024-04-03 DIAGNOSIS — Z87891 Personal history of nicotine dependence: Secondary | ICD-10-CM | POA: Diagnosis not present

## 2024-04-03 DIAGNOSIS — H903 Sensorineural hearing loss, bilateral: Secondary | ICD-10-CM | POA: Diagnosis present

## 2024-04-03 DIAGNOSIS — Z66 Do not resuscitate: Secondary | ICD-10-CM | POA: Diagnosis present

## 2024-04-03 DIAGNOSIS — Z79899 Other long term (current) drug therapy: Secondary | ICD-10-CM | POA: Diagnosis not present

## 2024-04-03 DIAGNOSIS — Z7189 Other specified counseling: Secondary | ICD-10-CM | POA: Diagnosis not present

## 2024-04-03 DIAGNOSIS — E872 Acidosis, unspecified: Secondary | ICD-10-CM | POA: Diagnosis present

## 2024-04-03 DIAGNOSIS — G8929 Other chronic pain: Secondary | ICD-10-CM | POA: Diagnosis present

## 2024-04-03 DIAGNOSIS — Z833 Family history of diabetes mellitus: Secondary | ICD-10-CM | POA: Diagnosis not present

## 2024-04-03 DIAGNOSIS — Z923 Personal history of irradiation: Secondary | ICD-10-CM | POA: Diagnosis not present

## 2024-04-03 DIAGNOSIS — Z170 Estrogen receptor positive status [ER+]: Secondary | ICD-10-CM | POA: Diagnosis not present

## 2024-04-03 DIAGNOSIS — F419 Anxiety disorder, unspecified: Secondary | ICD-10-CM | POA: Diagnosis present

## 2024-04-03 DIAGNOSIS — L304 Erythema intertrigo: Principal | ICD-10-CM

## 2024-04-03 DIAGNOSIS — E871 Hypo-osmolality and hyponatremia: Secondary | ICD-10-CM | POA: Diagnosis present

## 2024-04-03 DIAGNOSIS — Z853 Personal history of malignant neoplasm of breast: Secondary | ICD-10-CM

## 2024-04-03 DIAGNOSIS — Z515 Encounter for palliative care: Secondary | ICD-10-CM | POA: Diagnosis not present

## 2024-04-03 DIAGNOSIS — E860 Dehydration: Secondary | ICD-10-CM | POA: Diagnosis present

## 2024-04-03 DIAGNOSIS — Z818 Family history of other mental and behavioral disorders: Secondary | ICD-10-CM

## 2024-04-03 DIAGNOSIS — Z8249 Family history of ischemic heart disease and other diseases of the circulatory system: Secondary | ICD-10-CM | POA: Diagnosis not present

## 2024-04-03 DIAGNOSIS — F32A Depression, unspecified: Secondary | ICD-10-CM | POA: Diagnosis present

## 2024-04-03 DIAGNOSIS — I10 Essential (primary) hypertension: Secondary | ICD-10-CM | POA: Diagnosis present

## 2024-04-03 DIAGNOSIS — K219 Gastro-esophageal reflux disease without esophagitis: Secondary | ICD-10-CM | POA: Diagnosis present

## 2024-04-03 DIAGNOSIS — G9341 Metabolic encephalopathy: Secondary | ICD-10-CM | POA: Diagnosis present

## 2024-04-03 DIAGNOSIS — C50411 Malignant neoplasm of upper-outer quadrant of right female breast: Secondary | ICD-10-CM | POA: Diagnosis not present

## 2024-04-03 LAB — CBC WITH DIFFERENTIAL/PLATELET
Abs Immature Granulocytes: 0.1 K/uL — ABNORMAL HIGH (ref 0.00–0.07)
Basophils Absolute: 0.1 K/uL (ref 0.0–0.1)
Basophils Relative: 1 %
Eosinophils Absolute: 0.1 K/uL (ref 0.0–0.5)
Eosinophils Relative: 1 %
HCT: 40.2 % (ref 36.0–46.0)
Hemoglobin: 14.4 g/dL (ref 12.0–15.0)
Immature Granulocytes: 1 %
Lymphocytes Relative: 12 %
Lymphs Abs: 1 K/uL (ref 0.7–4.0)
MCH: 31.7 pg (ref 26.0–34.0)
MCHC: 35.8 g/dL (ref 30.0–36.0)
MCV: 88.5 fL (ref 80.0–100.0)
Monocytes Absolute: 0.7 K/uL (ref 0.1–1.0)
Monocytes Relative: 8 %
Neutro Abs: 6.5 K/uL (ref 1.7–7.7)
Neutrophils Relative %: 77 %
Platelets: 295 K/uL (ref 150–400)
RBC: 4.54 MIL/uL (ref 3.87–5.11)
RDW: 12.1 % (ref 11.5–15.5)
WBC: 8.4 K/uL (ref 4.0–10.5)
nRBC: 0 % (ref 0.0–0.2)

## 2024-04-03 LAB — COMPREHENSIVE METABOLIC PANEL WITH GFR
ALT: 57 U/L — ABNORMAL HIGH (ref 0–44)
AST: 58 U/L — ABNORMAL HIGH (ref 15–41)
Albumin: 4.1 g/dL (ref 3.5–5.0)
Alkaline Phosphatase: 121 U/L (ref 38–126)
Anion gap: 18 — ABNORMAL HIGH (ref 5–15)
BUN: 13 mg/dL (ref 8–23)
CO2: 18 mmol/L — ABNORMAL LOW (ref 22–32)
Calcium: 9.3 mg/dL (ref 8.9–10.3)
Chloride: 77 mmol/L — ABNORMAL LOW (ref 98–111)
Creatinine, Ser: 0.78 mg/dL (ref 0.44–1.00)
GFR, Estimated: >60 mL/min
Glucose, Bld: 100 mg/dL — ABNORMAL HIGH (ref 70–99)
Potassium: 3.9 mmol/L (ref 3.5–5.1)
Sodium: 113 mmol/L — CL (ref 135–145)
Total Bilirubin: 0.7 mg/dL (ref 0.0–1.2)
Total Protein: 7.7 g/dL (ref 6.5–8.1)

## 2024-04-03 LAB — URINALYSIS, ROUTINE W REFLEX MICROSCOPIC
Bacteria, UA: NONE SEEN
Bilirubin Urine: NEGATIVE
Glucose, UA: NEGATIVE mg/dL
Ketones, ur: 5 mg/dL — AB
Leukocytes,Ua: NEGATIVE
Nitrite: NEGATIVE
Protein, ur: NEGATIVE mg/dL
Specific Gravity, Urine: 1.005 (ref 1.005–1.030)
pH: 6 (ref 5.0–8.0)

## 2024-04-03 LAB — URINE DRUG SCREEN
Amphetamines: NEGATIVE
Barbiturates: NEGATIVE
Benzodiazepines: NEGATIVE
Cocaine: NEGATIVE
Fentanyl: NEGATIVE
Methadone Scn, Ur: NEGATIVE
Opiates: NEGATIVE
Tetrahydrocannabinol: NEGATIVE

## 2024-04-03 LAB — ETHANOL: Alcohol, Ethyl (B): <15 mg/dL

## 2024-04-03 LAB — LACTIC ACID, PLASMA: Lactic Acid, Venous: 1.5 mmol/L (ref 0.5–1.9)

## 2024-04-03 MED ORDER — ACETAMINOPHEN 500 MG PO TABS
500.0000 mg | ORAL_TABLET | Freq: Four times a day (QID) | ORAL | Status: AC | PRN
  Administered 2024-04-04 – 2024-04-12 (×4): 500 mg via ORAL
  Filled 2024-04-03 (×4): qty 1

## 2024-04-03 MED ORDER — ACETAMINOPHEN 325 MG PO TABS
650.0000 mg | ORAL_TABLET | Freq: Once | ORAL | Status: AC
  Administered 2024-04-03: 650 mg via ORAL
  Filled 2024-04-03: qty 2

## 2024-04-03 MED ORDER — POLYETHYLENE GLYCOL 3350 17 G PO PACK: 17.0000 g | PACK | Freq: Every day | ORAL | Status: DC | PRN

## 2024-04-03 MED ORDER — ENOXAPARIN SODIUM 40 MG/0.4ML IJ SOSY
40.0000 mg | PREFILLED_SYRINGE | INTRAMUSCULAR | Status: DC
  Administered 2024-04-04 – 2024-04-13 (×10): 40 mg via SUBCUTANEOUS
  Filled 2024-04-03 (×10): qty 0.4

## 2024-04-03 MED ORDER — PROCHLORPERAZINE EDISYLATE 10 MG/2ML IJ SOLN
5.0000 mg | Freq: Four times a day (QID) | INTRAMUSCULAR | Status: DC | PRN
  Administered 2024-04-04: 5 mg via INTRAVENOUS
  Filled 2024-04-03: qty 2

## 2024-04-03 MED ORDER — SODIUM CHLORIDE 0.9 % IV BOLUS
1000.0000 mL | Freq: Once | INTRAVENOUS | Status: AC
  Administered 2024-04-03: 1000 mL via INTRAVENOUS

## 2024-04-03 MED ORDER — CLOTRIMAZOLE 1 % EX CREA
1.0000 | TOPICAL_CREAM | Freq: Once | CUTANEOUS | Status: AC
  Administered 2024-04-03: 1 via TOPICAL
  Filled 2024-04-03: qty 15

## 2024-04-03 MED ORDER — ONDANSETRON HCL 4 MG/2ML IJ SOLN
4.0000 mg | Freq: Once | INTRAMUSCULAR | Status: AC
  Administered 2024-04-03: 4 mg via INTRAVENOUS
  Filled 2024-04-03: qty 2

## 2024-04-03 MED ORDER — MELATONIN 3 MG PO TABS
6.0000 mg | ORAL_TABLET | Freq: Every evening | ORAL | Status: DC | PRN
  Administered 2024-04-04 – 2024-04-12 (×4): 6 mg via ORAL
  Filled 2024-04-03 (×4): qty 2

## 2024-04-03 MED ORDER — CHLORHEXIDINE GLUCONATE CLOTH 2 % EX PADS
6.0000 | MEDICATED_PAD | Freq: Every day | CUTANEOUS | Status: DC
  Administered 2024-04-04 – 2024-04-13 (×10): 6 via TOPICAL

## 2024-04-03 NOTE — Plan of Care (Signed)
   Problem: Coping: Goal: Level of anxiety will decrease Outcome: Progressing   Problem: Pain Managment: Goal: General experience of comfort will improve and/or be controlled Outcome: Progressing

## 2024-04-03 NOTE — ED Triage Notes (Signed)
 Family called ems for altered mental status. LKW unknown one family member states she was last normal last Tuesday another one states yesterday or today. Pt unable to swallow medication.

## 2024-04-03 NOTE — ED Provider Notes (Signed)
 " [place] EMERGENCY DEPARTMENT AT [facility] Provider Note   CSN: [id number] Arrival date & time: 04/03/24  1832     Patient presents with: Altered Mental Status   Marilyn Haas is a 84 y.o. female.   Pt is a 84 yo female with pmhx significant for chronic back pain, HTN, breast cancer, depression, anxiety, HOH, etoh abuse, arthritis, GERD, and anemia.  EMS was called tonight because family said she's not been acting right.  It is unclear when she was LSN.  Pt denies any pain other than her usual LBP.  Pt denies f/c.  Speech is a little slurred, but EMS said family said this is normal for her when she does not have her dentures in her mouth.  Pt told the nurse that she's been drinking beer all day.   Pt's son said she lives by herself.  He did hire an aide to help her, but she refused to let the aide give her a shower.  So, he let the aide go.  He's been leaving her food while he travels for work, but she has not been eating it.  He said she's been pooping and peeing all over the house.  He said she can't walk very well because of the chronic pain.       Prior to Admission medications  Medication Sig Start Date End Date Taking? Authorizing Provider  acetaminophen  (TYLENOL ) 325 MG tablet Take 650 mg by mouth every 6 (six) hours as needed.    [provider]  ALPRAZolam  (XANAX ) 0.25 MG tablet Take 1 tablet (0.25 mg total) by mouth at bedtime as needed for anxiety. 12/11/22   Wouk, Devaughn Sayres, MD  ARIPiprazole  (ABILIFY ) 2 MG tablet Take 1 tablet (2 mg total) by mouth daily. 02/26/22   Landy Barnie RAMAN, NP  cyanocobalamin  1000 MCG tablet Take 1 tablet (1,000 mcg total) by mouth daily. 02/12/22   Evonnie Lenis, MD  DULoxetine  (CYMBALTA ) 30 MG capsule Take 4 capsules (120 mg total) by mouth daily for 7 days. 12/11/22 01/27/24  Wouk, Devaughn Sayres, MD  folic acid  (FOLVITE ) 1 MG tablet Take 1 tablet (1 mg total) by mouth daily. 02/26/22   Landy Barnie RAMAN, NP  furosemide   (LASIX ) 20 MG tablet Take 1 tablet (20 mg total) by mouth daily. Patient not taking: Reported on 01/27/2024 01/22/23   Johnson Laymon HERO, PA-C  loperamide  (IMODIUM ) 2 MG capsule Take 1 capsule (2 mg total) by mouth 4 (four) times daily as needed for diarrhea or loose stools. 10/26/22   Leath-Warren, Etta PARAS, NP  losartan  (COZAAR ) 25 MG tablet TAKE ONE TABLET BY MOUTH TWICE A DAY 04/09/23   Debera Jayson MATSU, MD  mirtazapine  (REMERON ) 7.5 MG tablet Take 7.5 mg by mouth at bedtime.    [provider]  pantoprazole  (PROTONIX ) 40 MG tablet TAKE ONE TABLET (40MG  TOTAL) BY MOUTH TWO TIMES DAILY 07/01/23   Kennedy Charmaine CROME, NP  polyethylene glycol (MIRALAX  / GLYCOLAX ) 17 g packet Take 17 g by mouth daily. 12/12/22   Wouk, Devaughn Sayres, MD  sodium chloride  1 g tablet Take 1 tablet (1 g total) by mouth 3 (three) times daily with meals. 12/11/22   Wouk, Devaughn Sayres, MD  tamoxifen  (NOLVADEX ) 10 MG tablet TAKE ONE TABLET (10MG  TOTAL) BY MOUTH EVERY OTHER DAY 08/27/23   Iruku, Praveena, MD  thiamine  (VITAMIN B-1) 100 MG tablet Take 1 tablet (100 mg total) by mouth daily. 02/12/22   Evonnie Lenis, MD  Allergies: Avelox [moxifloxacin hcl in nacl]    Review of Systems  Musculoskeletal:  Positive for back pain.  All other systems reviewed and are negative.   Updated Vital Signs BP 131/74   Pulse 70   Temp (!) 97.5 F (36.4 C) (Oral)   Resp 20   Ht 5' 5 (1.651 m)   Wt 59.8 kg   SpO2 97%   BMI 21.94 kg/m   Physical Exam Vitals and nursing note reviewed.  Constitutional:      Appearance: Normal appearance.  HENT:     Head: Normocephalic and atraumatic.     Right Ear: External ear normal.     Left Ear: External ear normal.     Nose: Nose normal.     Mouth/Throat:     Mouth: Mucous membranes are moist.     Pharynx: Oropharynx is clear.  Eyes:     Extraocular Movements: Extraocular movements intact.     Conjunctiva/sclera: Conjunctivae normal.     Pupils: Pupils are equal, round, and  reactive to light.  Cardiovascular:     Rate and Rhythm: Normal rate and regular rhythm.     Pulses: Normal pulses.     Heart sounds: Normal heart sounds.  Pulmonary:     Effort: Pulmonary effort is normal.     Breath sounds: Normal breath sounds.  Musculoskeletal:        General: Normal range of motion.  Skin:    General: Skin is warm.     Capillary Refill: Capillary refill takes less than 2 seconds.     Comments: Significant intertrigo under bilateral breasts  Neurological:     General: No focal deficit present.     Mental Status: She is alert and oriented to person, place, and time.     (all labs ordered are listed, but only abnormal results are displayed) Labs Reviewed  CBC WITH DIFFERENTIAL/PLATELET - Abnormal; Notable for the following components:      Result Value   Abs Immature Granulocytes 0.10 (*)    All other components within normal limits  COMPREHENSIVE METABOLIC PANEL WITH GFR - Abnormal; Notable for the following components:   Sodium 113 (*)    Chloride 77 (*)    CO2 18 (*)    Glucose, Bld 100 (*)    AST 58 (*)    ALT 57 (*)    Anion gap 18 (*)    All other components within normal limits  URINALYSIS, ROUTINE W REFLEX MICROSCOPIC - Abnormal; Notable for the following components:   Hgb urine dipstick SMALL (*)    Ketones, ur 5 (*)    All other components within normal limits  CULTURE, BLOOD (ROUTINE X 2)  CULTURE, BLOOD (ROUTINE X 2)  LACTIC ACID, PLASMA  ETHANOL  URINE DRUG SCREEN  CBG MONITORING, ED    EKG: EKG Interpretation Date/Time:  Monday April 03 2024 19:24:15 EST Ventricular Rate:  82 PR Interval:  36 QRS Duration:  102 QT Interval:  384 QTC Calculation: 449 R Axis:   179  Text Interpretation: duplicate Confirmed by Dean Clarity [phone]) on 04/03/2024 7:50:20 PM  Radiology: CT Head Wo Contrast Result Date: 04/03/2024 EXAM: CT HEAD WITHOUT CONTRAST 04/03/2024 08:08:19 PM TECHNIQUE: CT of the head was performed without the  administration of intravenous contrast. Automated exposure control, iterative reconstruction, and/or weight based adjustment of the mA/kV was utilized to reduce the radiation dose to as low as reasonably achievable. COMPARISON: MRI head 12/07/2022. CLINICAL HISTORY: Mental status change, unknown cause. FINDINGS:  BRAIN AND VENTRICLES: No acute hemorrhage. No evidence of acute infarct. No hydrocephalus. No extra-axial collection. No mass effect or midline shift. Periventricular and subcortical white matter scattered low-density changes compatible with chronic microvascular ischemic change. Mild diffuse cerebral volume loss. The CT appearance is consistent with the remote lacunar infarct in the right paramedian pons noted on the prior MRI. ORBITS: No acute abnormality. SINUSES: Air-fluid level in right sphenoid sinus. SOFT TISSUES AND SKULL: No acute soft tissue abnormality. No skull fracture. Atherosclerotic calcifications in skull base large vessels. IMPRESSION: 1. No acute intracranial abnormality. 2. Chronic microvascular ischemic change and mild diffuse cerebral volume loss. 3. 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 4. Air-fluid level in the right sphenoid sinus, which can be seen with acute sinusitis. Electronically signed by: Donnice Mania MD 04/03/2024 08:23 PM EST RP Workstation: [id number]   DG Chest Port 1 View Result Date: 04/03/2024 EXAM: 1 VIEW(S) XRAY OF THE CHEST 04/03/2024 07:01:30 PM COMPARISON: 12/07/2022 CLINICAL HISTORY: AMS (altered mental status) FINDINGS: LUNGS AND PLEURA: No focal pulmonary opacity. No pleural effusion. No pneumothorax. HEART AND MEDIASTINUM: Moderate hiatal hernia. No acute abnormality of the cardiac silhouette. BONES AND SOFT TISSUES: No acute osseous abnormality. IMPRESSION: 1. No acute findings. Electronically signed by: Pinkie Pebbles MD 04/03/2024 07:05 PM EST RP Workstation: [id number]     Procedures   Medications Ordered in  the ED  acetaminophen  (TYLENOL ) tablet 650 mg (650 mg Oral Given 04/03/24 1927)  clotrimazole  (LOTRIMIN ) 1 % cream 1 Application (1 Application Topical Given 04/03/24 2121)  sodium chloride  0.9 % bolus 1,000 mL (1,000 mLs Intravenous New Bag/Given 04/03/24 2120)                                    Medical Decision Making Amount and/or Complexity of Data Reviewed Labs: ordered. Radiology: ordered.  Risk OTC drugs. Decision regarding hospitalization.   This patient presents to the ED for concern of ams, this involves an extensive number of treatment options, and is a complaint that carries with it a high risk of complications and morbidity.  The differential diagnosis includes infection, cva, electrolyte abn   Co morbidities that complicate the patient evaluation  chronic back pain, HTN, breast cancer, depression, anxiety, HOH, arthritis, GERD, and anemia   Additional history obtained:  Additional history obtained from epic chart review External records from outside source obtained and reviewed including EMS report   Lab Tests:  I Ordered, and personally interpreted labs.  The pertinent results include:  cbc nl; lactic nl; cmp with Na low at 113 (127 a year ago); ua nl   Imaging Studies ordered:  I ordered imaging studies including cxr and ct head  I independently visualized and interpreted imaging which showed CXR: No acute findings.  CT head: No acute intracranial abnormality.  2. Chronic microvascular ischemic change and mild diffuse cerebral volume loss.  3. 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  4. Air-fluid level in the right sphenoid sinus, which can be seen with acute  sinusitis.   I agree with the radiologist interpretation   Cardiac Monitoring:  The patient was maintained on a cardiac monitor.  I personally viewed and interpreted the cardiac monitored which showed an underlying rhythm of: nsr   Medicines ordered and  prescription drug management:  I ordered medication including ivfs  for sx  Reevaluation of the patient  after these medicines showed that the patient improved I have reviewed the patients home medicines and have made adjustments as needed   Test Considered:  ct   Critical Interventions:  ivfs   Consultations Obtained:  I requested consultation with the hospitalist (Dr. Shona),  and discussed lab and imaging findings as well as pertinent plan -she will admit   Problem List / ED Course:  Hyponatremia:  possibly due to alcohol abuse, but etoh neg now.  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  She has also not been eating/drinking well.  Pt given ivfs as I suspect she is volume depleted.   Ambulatory dysfct:  son asks sw/toc help with snf/rehab placement at d/c Intertrigo:  lotrimin  applied   Reevaluation:  After the interventions noted above, I reevaluated the patient and found that they have :improved   Social Determinants of Health:  Lives at home   Dispostion:  After consideration of the diagnostic results and the patients response to treatment, I feel that the patent would benefit from admission.       Final diagnoses:  Intertrigo  Hyponatremia  Dehydration    ED Discharge Orders     None          Dean Clarity, MD 04/03/24 2142  "

## 2024-04-04 DIAGNOSIS — E871 Hypo-osmolality and hyponatremia: Secondary | ICD-10-CM

## 2024-04-04 LAB — BASIC METABOLIC PANEL WITH GFR
Anion gap: 11 (ref 5–15)
Anion gap: 12 (ref 5–15)
Anion gap: 12 (ref 5–15)
Anion gap: 13 (ref 5–15)
Anion gap: 13 (ref 5–15)
Anion gap: 15 (ref 5–15)
BUN: 10 mg/dL (ref 8–23)
BUN: 10 mg/dL (ref 8–23)
BUN: 11 mg/dL (ref 8–23)
BUN: 12 mg/dL (ref 8–23)
BUN: 13 mg/dL (ref 8–23)
BUN: 14 mg/dL (ref 8–23)
CO2: 15 mmol/L — ABNORMAL LOW (ref 22–32)
CO2: 17 mmol/L — ABNORMAL LOW (ref 22–32)
CO2: 18 mmol/L — ABNORMAL LOW (ref 22–32)
CO2: 19 mmol/L — ABNORMAL LOW (ref 22–32)
CO2: 19 mmol/L — ABNORMAL LOW (ref 22–32)
CO2: 20 mmol/L — ABNORMAL LOW (ref 22–32)
Calcium: 7.4 mg/dL — ABNORMAL LOW (ref 8.9–10.3)
Calcium: 7.8 mg/dL — ABNORMAL LOW (ref 8.9–10.3)
Calcium: 7.8 mg/dL — ABNORMAL LOW (ref 8.9–10.3)
Calcium: 7.9 mg/dL — ABNORMAL LOW (ref 8.9–10.3)
Calcium: 7.9 mg/dL — ABNORMAL LOW (ref 8.9–10.3)
Calcium: 8 mg/dL — ABNORMAL LOW (ref 8.9–10.3)
Chloride: 83 mmol/L — ABNORMAL LOW (ref 98–111)
Chloride: 85 mmol/L — ABNORMAL LOW (ref 98–111)
Chloride: 88 mmol/L — ABNORMAL LOW (ref 98–111)
Chloride: 88 mmol/L — ABNORMAL LOW (ref 98–111)
Chloride: 89 mmol/L — ABNORMAL LOW (ref 98–111)
Chloride: 90 mmol/L — ABNORMAL LOW (ref 98–111)
Creatinine, Ser: 0.65 mg/dL (ref 0.44–1.00)
Creatinine, Ser: 0.67 mg/dL (ref 0.44–1.00)
Creatinine, Ser: 0.68 mg/dL (ref 0.44–1.00)
Creatinine, Ser: 0.69 mg/dL (ref 0.44–1.00)
Creatinine, Ser: 0.8 mg/dL (ref 0.44–1.00)
Creatinine, Ser: 0.86 mg/dL (ref 0.44–1.00)
GFR, Estimated: >60 mL/min
GFR, Estimated: >60 mL/min
GFR, Estimated: >60 mL/min
GFR, Estimated: >60 mL/min
GFR, Estimated: >60 mL/min
GFR, Estimated: >60 mL/min
Glucose, Bld: 111 mg/dL — ABNORMAL HIGH (ref 70–99)
Glucose, Bld: 112 mg/dL — ABNORMAL HIGH (ref 70–99)
Glucose, Bld: 113 mg/dL — ABNORMAL HIGH (ref 70–99)
Glucose, Bld: 80 mg/dL (ref 70–99)
Glucose, Bld: 83 mg/dL (ref 70–99)
Glucose, Bld: 87 mg/dL (ref 70–99)
Potassium: 3.6 mmol/L (ref 3.5–5.1)
Potassium: 3.7 mmol/L (ref 3.5–5.1)
Potassium: 3.8 mmol/L (ref 3.5–5.1)
Potassium: 3.8 mmol/L (ref 3.5–5.1)
Potassium: 3.9 mmol/L (ref 3.5–5.1)
Potassium: 3.9 mmol/L (ref 3.5–5.1)
Sodium: 113 mmol/L — CL (ref 135–145)
Sodium: 117 mmol/L — CL (ref 135–145)
Sodium: 118 mmol/L — CL (ref 135–145)
Sodium: 120 mmol/L — ABNORMAL LOW (ref 135–145)
Sodium: 120 mmol/L — ABNORMAL LOW (ref 135–145)
Sodium: 120 mmol/L — ABNORMAL LOW (ref 135–145)

## 2024-04-04 LAB — OSMOLALITY: Osmolality: 240 mosm/kg — CL (ref 275–295)

## 2024-04-04 LAB — OSMOLALITY, URINE: Osmolality, Ur: 162 mosm/kg — ABNORMAL LOW (ref 300–900)

## 2024-04-04 LAB — BLOOD GAS, VENOUS
Acid-base deficit: 2.9 mmol/L — ABNORMAL HIGH (ref 0.0–2.0)
Bicarbonate: 22 mmol/L (ref 20.0–28.0)
Drawn by: 1528
O2 Saturation: 83.2 %
Patient temperature: 36.6
pCO2, Ven: 37 mmHg — ABNORMAL LOW (ref 44–60)
pH, Ven: 7.38 (ref 7.25–7.43)
pO2, Ven: 50 mmHg — ABNORMAL HIGH (ref 32–45)

## 2024-04-04 LAB — SODIUM, URINE, RANDOM: Sodium, Ur: <30 mmol/L

## 2024-04-04 LAB — MRSA NEXT GEN BY PCR, NASAL: MRSA by PCR Next Gen: NOT DETECTED

## 2024-04-04 MED ORDER — SODIUM CHLORIDE 0.9 % IV BOLUS
250.0000 mL | Freq: Once | INTRAVENOUS | Status: AC
  Administered 2024-04-04: 250 mL via INTRAVENOUS

## 2024-04-04 MED ORDER — ORAL CARE MOUTH RINSE: 15.0000 mL | OROMUCOSAL | Status: DC | PRN

## 2024-04-04 MED ORDER — HYDROCODONE-ACETAMINOPHEN 5-325 MG PO TABS
1.0000 | ORAL_TABLET | Freq: Four times a day (QID) | ORAL | Status: DC | PRN
  Administered 2024-04-05 – 2024-04-13 (×5): 1 via ORAL
  Filled 2024-04-04 (×5): qty 1

## 2024-04-04 MED ORDER — DEXTROSE 5 % IV SOLN: INTRAVENOUS | Status: DC

## 2024-04-04 MED ORDER — SODIUM CHLORIDE 0.9 % IV SOLN: INTRAVENOUS | Status: DC

## 2024-04-04 MED ORDER — DULOXETINE HCL 60 MG PO CPEP
120.0000 mg | ORAL_CAPSULE | Freq: Every day | ORAL | Status: DC
  Administered 2024-04-04 – 2024-04-05 (×2): 120 mg via ORAL
  Filled 2024-04-04 (×2): qty 2

## 2024-04-04 MED ORDER — TAMOXIFEN CITRATE 10 MG PO TABS
10.0000 mg | ORAL_TABLET | Freq: Every day | ORAL | Status: DC
  Filled 2024-04-04: qty 1

## 2024-04-04 NOTE — Evaluation (Signed)
 Physical Therapy Evaluation Patient Details Name: Marilyn Haas MRN: [id number] DOB: 1940/12/19 Today's Date: 04/04/2024  History of Present Illness  Marilyn  H Haas is a 84 y.o. 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  She lives at home with her son who travels often.  He has paid helpers to feed and shower her, however the patient has not been eating her meals or cooperating with the helpers regarding taking showers.  The patient's son came home today and noticed urine and feces different places in the house.  The patient was confused.  Reportedly, she may have had a fall.  He called EMS.  No recent use of alcohol per the patient's son at bedside.   Clinical Impression  Patient demonstrates slow labored movement for sitting up at bedside, very unsteady on feet and limited to a few steps forward/backwards before having to sit due to BLE weakness and fall risk. Patient tolerated sitting up in chair after therapy with her son present. Patient will benefit from continued skilled physical therapy in hospital and recommended venue below to increase strength, balance, endurance for safe ADLs and gait.           If plan is discharge home, recommend the following: A lot of help with bathing/dressing/bathroom;A lot of help with walking and/or transfers;Help with stairs or ramp for entrance;Assist for transportation;Assistance with cooking/housework   Can travel by private vehicle   No    Equipment Recommendations None recommended by PT  Recommendations for Other Services       Functional Status Assessment Patient has had a recent decline in their functional status and demonstrates  the ability to make significant improvements in function in a reasonable and predictable amount of time.     Precautions / Restrictions Precautions Precautions: Fall Recall of Precautions/Restrictions: Impaired Restrictions Weight Bearing Restrictions Per Provider Order: No      Mobility  Bed Mobility Overal bed mobility: Needs Assistance Bed Mobility: Supine to Sit     Supine to sit: Mod assist, Max assist     General bed mobility comments: unsteady labored movement    Transfers Overall transfer level: Needs assistance Equipment used: Rolling walker (2 wheels) Transfers: Sit to/from Stand, Bed to chair/wheelchair/BSC Sit to Stand: Mod assist   Step pivot transfers: Mod assist       General transfer comment: unsteady labored movement, increased time    Ambulation/Gait Ambulation/Gait assistance: Mod assist Gait Distance (Feet): 10 Feet Assistive device: Rollator (4 wheels) Gait Pattern/deviations: Decreased step length - right, Decreased step length - left, Decreased stride length Gait velocity: slow     General Gait Details: limited to a few slow labored steps forward/backwards at bedside before requesting to sit due to fatigue  Stairs            Wheelchair Mobility     Tilt Bed    Modified Rankin (Stroke Patients Only)       Balance Overall balance assessment: Needs assistance Sitting-balance support: Feet supported, No upper extremity supported Sitting balance-Leahy Scale: Fair Sitting balance - Comments: seated at EOB   Standing balance support: Bilateral upper extremity supported, During functional activity, Reliant on assistive device for balance Standing balance-Leahy Scale: Poor Standing balance comment: using  RW                             Pertinent Vitals/Pain Pain Assessment Pain Assessment: Faces Faces Pain Scale: Hurts a little bit Pain Location: back during bed mobility Pain Descriptors / Indicators:  Discomfort Pain Intervention(s): Monitored during session, Repositioned    Home Living Family/patient expects to be discharged to:: Private residence Living Arrangements: Children Available Help at Discharge: Family;Available PRN/intermittently Type of Home: House Home Access: Stairs to enter Entrance Stairs-Rails: Right Entrance Stairs-Number of Steps: 2   Home Layout: Multi-level;Able to live on main level with bedroom/bathroom Home Equipment: Rolling Walker (2 wheels);Rollator (4 wheels);Shower seat      Prior Function Prior Level of Function : Needs assist       Physical Assist : ADLs (physical);Mobility (physical) Mobility (physical): Transfers;Gait;Bed mobility ADLs (physical): IADLs;Dressing;Bathing Mobility Comments: Household ambulation with rollator. ADLs Comments: Pt's son reported that she has not recently been getting help, but that she does not seem to be caring for herself well. Son stated he has been finding soiled clothes and noting that pt is not eating or drinking food that he leaves for the pt. Pt did have PCA's but does not anymore.     Extremity/Trunk Assessment   Upper Extremity Assessment Upper Extremity Assessment: Defer to OT evaluation    Lower Extremity Assessment Lower Extremity Assessment: Generalized weakness    Cervical / Trunk Assessment Cervical / Trunk Assessment: Kyphotic  Communication   Communication Communication: Impaired Factors Affecting Communication: Reduced clarity of speech    Cognition Arousal: Alert Behavior During Therapy: WFL for tasks assessed/performed                             Following commands: Intact       Cueing Cueing Techniques: Verbal cues, Tactile cues     General Comments      Exercises     Assessment/Plan    PT Assessment Patient needs continued PT services  PT Problem List Decreased strength;Decreased activity tolerance;Decreased balance;Decreased mobility       PT  Treatment Interventions DME instruction;Gait training;Stair training;Functional mobility training;Therapeutic activities;Therapeutic exercise;Balance training;Patient/family education    PT Goals (Current goals can be found in the Care Plan section)  Acute Rehab PT Goals Patient Stated Goal: return home after rehab PT Goal Formulation: With patient/family Time For Goal Achievement: 04/18/24 Potential to Achieve Goals: Good    Frequency Min 3X/week     Co-evaluation PT/OT/SLP Co-Evaluation/Treatment: Yes Reason for Co-Treatment: To address functional/ADL transfers PT goals addressed during session: Mobility/safety with mobility;Balance;Proper use of DME OT goals addressed during session: ADL's and self-care       AM-PAC PT 6 Clicks Mobility  Outcome Measure Help needed turning from your back to your side while in a flat bed without using bedrails?: A Lot Help needed moving from lying on your back to sitting on the side of a flat bed without using bedrails?: A Lot Help needed moving to and from a bed to a chair (including a wheelchair)?: A Lot Help needed standing up from a chair using your arms (e.g., wheelchair or bedside chair)?: A Lot Help needed to walk in hospital room?: A Lot Help needed climbing 3-5 steps with a railing? : Total 6 Click Score: 11    End of Session   Activity Tolerance: Patient tolerated treatment well;Patient limited by fatigue Patient left: in chair;with  call bell/phone within reach;with family/visitor present Nurse Communication: Mobility status PT Visit Diagnosis: Unsteadiness on feet (R26.81);Other abnormalities of gait and mobility (R26.89);Muscle weakness (generalized) (M62.81)    Time: 9147-9080 PT Time Calculation (min) (ACUTE ONLY): 27 min   Charges:   PT Evaluation $PT Eval Moderate Complexity: 1 Mod PT Treatments $Therapeutic Activity: 23-37 mins PT General Charges $$ ACUTE PT VISIT: 1 Visit         2:53 PM, 04/04/24 Lynwood Music, MPT Physical Therapist with [facility] [phone] office [phone] mobile phone

## 2024-04-04 NOTE — Plan of Care (Signed)
" °  Problem: Acute Rehab OT Goals (only OT should resolve) Goal: Pt. Will Perform Grooming Flowsheets (Taken 04/04/2024 1153) Pt Will Perform Grooming:  with modified independence  standing Goal: Pt. Will Perform Lower Body Bathing Flowsheets (Taken 04/04/2024 1153) Pt Will Perform Lower Body Bathing:  with modified independence  sitting/lateral leans Goal: Pt. Will Perform Upper Body Dressing Flowsheets (Taken 04/04/2024 1153) Pt Will Perform Upper Body Dressing: with modified independence Goal: Pt. Will Perform Lower Body Dressing Flowsheets (Taken 04/04/2024 1153) Pt Will Perform Lower Body Dressing:  with modified independence  sitting/lateral leans Goal: Pt. Will Transfer To Toilet Flowsheets (Taken 04/04/2024 1153) Pt Will Transfer to Toilet:  with modified independence  ambulating  with supervision Goal: Pt. Will Perform Toileting-Clothing Manipulation Flowsheets (Taken 04/04/2024 1153) Pt Will Perform Toileting - Clothing Manipulation and hygiene:  with modified independence  sitting/lateral leans  with supervision Goal: Pt/Caregiver Will Perform Home Exercise Program Flowsheets (Taken 04/04/2024 1153) Pt/caregiver will Perform Home Exercise Program:  Increased strength  Both right and left upper extremity  Independently  Jamaica Inthavong OT, MOT  "

## 2024-04-04 NOTE — Progress Notes (Addendum)
 " PROGRESS NOTE    Marilyn  TOSHIE Haas  FMW:[id number] DOB: Nov 22, 1940 DOA: 04/03/2024 PCP: Orpha Yancey LABOR, MD   Brief Narrative:    Marilyn  H Haas is a 84 y.o. 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  She lives at home with her son who travels often.  He has paid helpers to feed and shower her, however the patient has not been eating her meals or cooperating with the helpers regarding taking showers.  The patient's son came home today and noticed urine and feces different places in the house.  The patient was confused.  Reportedly, she may have had a fall.  He called EMS.  No recent use of alcohol per the patient's son at bedside.   In the ER, alert and confused.  No diagnosis of dementia per the son at bedside.  Noncontrast head CT showed no acute intracranial abnormality.  It showed chronic microvascular ischemic change and mild diffuse cerebral volume loss.  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  Air-fluid level in the right sphenoid sinus which can be seen with acute sinusitis.  Serum sodium 113 from baseline of 127.  UA was negative for pyuria.   The patient received 1 L NS IV fluid bolus x 1 and IV Zofran  4 mg x 1 in the ER.  TRH, hospitalist service, was asked to admit for worsening chronic hyponatremia.   ED Course: Temperature 97.  BP 126/57, pulse 74, respiratory 14, O2 saturation 100% on room air.    Assessment & Plan:   Principal Problem:   Hyponatremia Active Problems:   Hypokalemia   Malignant neoplasm of upper-outer quadrant of right breast in female, estrogen receptor positive (HCC)   Frequent falls   Anxiety and  depression   Worsening chronic hyponatremia Baseline serum sodium 127 Presented with serum sodium level of 113 Received NS bolus. Sodium has improved to 120 today.  Will start D5W at a slow rate to avoid overcorrection.  Every 4 BMP.  history of breast cancer on tamoxifen  Tamoxifen  can cause hyponatremia Will need input from medical oncology regarding continuing home tamoxifen .   Acute metabolic encephalopathy secondary to the above Noncontrast head CT was nonacute, no evidence of intracranial bleed Reorient as needed Continue to treat underlying conditions Fall precautions.   Mild non-anion gap acidosis. Improving.  Creatinine is stable at 0.8   elevated liver chemistries AST 58, ALT 57.   Breast cancer on tamoxifen  Resume home regimen Follow-up with oncology outpatient.   History of stroke seen on MRI brain 12/07/2022 Currently not on antiplatelets or statin Monitor on telemetry.   Generalized weakness with possible fall PT OT evaluation Fall precaution. Case management consultation, may need placement  Chronic back pain: Will order for Norco as needed.  Son said Tylenol  didn't help.      DVT prophylaxis: Subcu Lovenox  daily.   Code Status: Full code, per her son at bedside.   Family Communication: Updated the patient's son at bedside   Disposition Plan: Admitted to stepdown unit.   Consults called: None.   Admission status: Inpatient status.     Status is: Inpatient The patient requires at least 2 midnights for further evaluation and treatment of  present condition.   Subjective:  Patient is on a recliner, son at the bedside.  Patient is alert and interactive.  She reports of back pain and would like to go back to bed.  Vital signs are stable.  Sodium improved to 120.  Son is concerned about her ability to stay at home and has been considering placement recently  Objective: Vitals:   04/04/24 1000 04/04/24 1100 04/04/24 1119 04/04/24 1200  BP: 108/65  (!) 134/59  (!) 155/78  Pulse: 85 72  83  Resp: 20 16  13   Temp:   98.3 F (36.8 C)   TempSrc:   Axillary   SpO2: 100% 100%  100%  Weight:      Height:        Intake/Output Summary (Last 24 hours) at 04/04/2024 1341 Last data filed at 04/04/2024 9360 Gross per 24 hour  Intake 1558.86 ml  Output 1150 ml  Net 408.86 ml   Filed Weights   04/03/24 1839 04/03/24 2248  Weight: 59.8 kg 55.4 kg    Examination:  General exam: Alert, calm and comfortable  Respiratory system: Bilateral decreased breath sounds at bases Cardiovascular system: S1 & S2 heard, Rate controlled Gastrointestinal system: Abdomen is nondistended, soft and nontender. Normal bowel sounds heard. Extremities: No cyanosis, clubbing, edema  Central nervous system: Alert and orientedx2. No focal neurological deficits. Moving extremities    Data Reviewed: I have personally reviewed following labs and imaging studies  CBC: Recent Labs  Lab 04/03/24 1853  WBC 8.4  NEUTROABS 6.5  HGB 14.4  HCT 40.2  MCV 88.5  PLT 295   Basic Metabolic Panel: Recent Labs  Lab 04/03/24 1853 04/04/24 0010 04/04/24 0501 04/04/24 0750 04/04/24 1152  NA 113* 113* 118* 120* 120*  K 3.9 3.9 3.8 3.8 3.6  CL 77* 83* 85* 88* 89*  CO2 18* 15* 20* 19* 19*  GLUCOSE 100* 87 83 80 111*  BUN 13 13 12 10 11   CREATININE 0.78 0.67 0.69 0.65 0.80  CALCIUM 9.3 7.8* 7.9* 7.8* 8.0*   GFR: Estimated Creatinine Clearance: 45 mL/min (by C-G formula based on SCr of 0.8 mg/dL). Liver Function Tests: Recent Labs  Lab 04/03/24 1853  AST 58*  ALT 57*  ALKPHOS 121  BILITOT 0.7  PROT 7.7  ALBUMIN 4.1   No results for input(s): LIPASE, AMYLASE in the last 168 hours. No results for input(s): AMMONIA in the last 168 hours. Coagulation Profile: No results for input(s): INR, PROTIME in the last 168 hours. Cardiac Enzymes: No results for input(s): CKTOTAL, CKMB, CKMBINDEX, TROPONINI in the last 168 hours. BNP (last 3  results) No results for input(s): PROBNP in the last 8760 hours. HbA1C: No results for input(s): HGBA1C in the last 72 hours. CBG: No results for input(s): GLUCAP in the last 168 hours. Lipid Profile: No results for input(s): CHOL, HDL, LDLCALC, TRIG, CHOLHDL, LDLDIRECT in the last 72 hours. Thyroid  Function Tests: No results for input(s): TSH, T4TOTAL, FREET4, T3FREE, THYROIDAB in the last 72 hours. Anemia Panel: No results for input(s): VITAMINB12, FOLATE, FERRITIN, TIBC, IRON, RETICCTPCT in the last 72 hours. Sepsis Labs: Recent Labs  Lab 04/03/24 1841  LATICACIDVEN 1.5    Recent Results (from the past 240 hours)  Culture, blood (routine x 2)     Status: None (Preliminary result)   Collection Time: 04/03/24  6:41 PM   Specimen: BLOOD  Result Value Ref Range Status   Specimen Description BLOOD  Final   Special Requests NONE  Final   Culture   Final    NO GROWTH < 12 HOURS Performed at [facility], [address], [place], [place] [address]    Report Status PENDING  Incomplete  Culture, blood (routine x 2)     Status: None (Preliminary result)   Collection Time: 04/03/24  6:46 PM   Specimen: BLOOD  Result Value Ref Range Status   Specimen Description BLOOD  Final   Special Requests NONE  Final   Culture   Final    NO GROWTH < 12 HOURS Performed at [facility], [address] [place], [place] [address]    Report Status PENDING  Incomplete  MRSA Next Gen by PCR, Nasal     Status: None   Collection Time: 04/03/24 10:06 PM   Specimen: Nasal Mucosa; Nasal Swab  Result Value Ref Range Status   MRSA by PCR Next Gen NOT DETECTED NOT DETECTED Final    Comment: (NOTE) The GeneXpert MRSA Assay (FDA approved for NASAL specimens only), is one component of a comprehensive MRSA colonization surveillance program. It is not intended to diagnose MRSA infection nor to guide or monitor treatment for MRSA infections. Test performance is not FDA  approved in patients less than 16 years old. Performed at [facility], [address] [place], [place] [address]          Radiology Studies: CT Head Wo Contrast Result Date: 04/03/2024 EXAM: CT HEAD WITHOUT CONTRAST 04/03/2024 08:08:19 PM TECHNIQUE: CT of the head was performed without the administration of intravenous contrast. Automated exposure control, iterative reconstruction, and/or weight based adjustment of the mA/kV was utilized to reduce the radiation dose to as low as reasonably achievable. COMPARISON: MRI head 12/07/2022. CLINICAL HISTORY: Mental status change, unknown cause. FINDINGS: BRAIN AND VENTRICLES: No acute hemorrhage. No evidence of acute infarct. No hydrocephalus. No extra-axial collection. No mass effect or midline shift. Periventricular and subcortical white matter scattered low-density changes compatible with chronic microvascular ischemic change. Mild diffuse cerebral volume loss. The CT appearance is consistent with the remote lacunar infarct in the right paramedian pons noted on the prior MRI. ORBITS: No acute abnormality. SINUSES: Air-fluid level in right sphenoid sinus. SOFT TISSUES AND SKULL: No acute soft tissue abnormality. No skull fracture. Atherosclerotic calcifications in skull base large vessels. IMPRESSION: 1. No acute intracranial abnormality. 2. Chronic microvascular ischemic change and mild diffuse cerebral volume loss. 3. 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 4. Air-fluid level in the right sphenoid sinus, which can be seen with acute sinusitis. Electronically signed by: Donnice Mania MD 04/03/2024 08:23 PM EST RP Workstation: [id number]   DG Chest Port 1 View Result Date: 04/03/2024 EXAM: 1 VIEW(S) XRAY OF THE CHEST 04/03/2024 07:01:30 PM COMPARISON: 12/07/2022 CLINICAL HISTORY: AMS (altered mental status) FINDINGS: LUNGS AND PLEURA: No focal pulmonary opacity. No pleural effusion. No pneumothorax. HEART AND MEDIASTINUM:  Moderate hiatal hernia. No acute abnormality of the cardiac silhouette. BONES AND SOFT TISSUES: No acute osseous abnormality. IMPRESSION: 1. No acute findings. Electronically signed by: Pinkie Pebbles MD 04/03/2024 07:05 PM EST RP Workstation: [id number]        Scheduled Meds:  Chlorhexidine  Gluconate Cloth  6 each Topical Daily   DULoxetine   120 mg Oral Daily   enoxaparin  (LOVENOX ) injection  40 mg Subcutaneous Q24H   Continuous Infusions:  dextrose  25 mL/hr at 04/04/24 0934          Aminat Shelburne, MD [facility] 04/04/2024, 1:41 PM   "

## 2024-04-04 NOTE — TOC Initial Note (Addendum)
 Transition of Care [facility]) - Initial/Assessment Note    Patient Details  Name: Marilyn Haas MRN: [id number] Date of Birth: Apr 21, 1940  [facility]) CM/SW Contact:    Noreen KATHEE Cleotilde ISRAEL Phone Number: 04/04/2024, 11:55 AM  Clinical Narrative:                  CSW spoke with patient son Josue to assess patient and discuss PT recommendation for SNF. Josue shared that it is him, his wife , and patient in the home and  that patient was using a walker. He shared that prior to patient being hospitalized she was independent with little assist. Son shared that patient had a rough week last week and that he was looking into [facility] for patient. Son agreeable to SNF and CSW shared process with son regarding referral, need of 3-midnights, and once patient has completed her short-term rehab, to work with facility to get patient assessed by [facility], son expressed understanding process. CSW will continue to follow.   Addendum 2:36 PM   CSW spoke with son about [facility] being able to over patient a bed and that [facility] declined. Son asked CSW to send his mom referral to [facility]. Referral sent , pending a confirmed bed offer. CSW did call [facility] and she shared that she will check tomorrow with [facility] and check her bed availability.    Expected Discharge Plan: Skilled Nursing Facility Barriers to Discharge: Continued Medical Work up   Patient Goals and CMS Choice Patient states their goals for this hospitalization and ongoing recovery are:: get well to eventually go to [facility] after rehab CMS Medicare.gov Compare Post Acute Care list provided to:: Patient Represenative (must comment) (Son- Josue) Choice offered to / list presented to : Adult Children      Expected Discharge Plan and Services In-house Referral: Clinical Social Work   Post Acute Care Choice: Durable Medical Equipment Living arrangements for the past 2 months: Single Family Home                                       Prior Living Arrangements/Services Living arrangements for the past 2 months: Single Family Home Lives with:: Adult Children, Other (Comment) (Daughter n social worker) Patient language and need for interpreter reviewed:: Yes Do you feel safe going back to the place where you live?: Yes      Need for Family Participation in Patient Care: Yes (Comment) Care giver support system in place?: No (comment) Current home services: DME Criminal Activity/Legal Involvement Pertinent to Current Situation/Hospitalization: No - Comment as needed  Activities of Daily Living      Permission Sought/Granted      Share Information with NAME: Josue     Permission granted to share info w Relationship: Son     Emotional Assessment   Attitude/Demeanor/Rapport: Unable to Assess Affect (typically observed): Unable to Assess   Alcohol / Substance Use: Not Applicable Psych Involvement: No (comment)  Admission diagnosis:  Dehydration [E86.0] Intertrigo [L30.4] Hyponatremia [E87.1] Patient Active Problem List   Diagnosis Date Noted   Duodenal ulcer 12/08/2022   Acute upper GI bleeding 12/08/2022   Hyponatremia 12/07/2022   Hypokalemia 12/07/2022   GERD without esophagitis 12/07/2022   Anxiety and depression 12/07/2022   History of right breast cancer 12/07/2022   Confusion 12/07/2022   Alcohol abuse 12/07/2022   Pressure injury of skin 12/07/2022   Frequent falls  02/14/2022   GI bleed 02/06/2022   Elevated BUN 02/06/2022   GERD (gastroesophageal reflux disease) 07/05/2021   Fall at home, initial encounter 07/04/2021   MDD (major depressive disorder), recurrent episode, moderate (HCC) 06/13/2018   Dysphagia 09/01/2017   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 (HCC) 08/13/2016   Malignant neoplasm of upper-outer quadrant of right breast in female, estrogen receptor positive (HCC) 06/22/2016   Osteopenia determined by x-ray 06/22/2016   Chronic hyponatremia 10/12/2015   PVC's  (premature ventricular contractions) 12/16/2014   Acute on chronic blood loss anemia 08/09/2012   B12 deficiency anemia 08/09/2012   Vitamin D deficiency 08/09/2012   Anxiety 08/09/2012   Hyperlipidemia 05/25/2011   Essential hypertension 05/25/2011   PCP:  Orpha Yancey LABOR, MD Pharmacy:   [facility] [phone] - [place], [place] - [address] AT [facility] [address] [place] [address] Phone: [phone] Fax: [phone]  [facility] - [place], [place] - [address] [place] [address] Phone: [phone] Fax: [phone]  [facility] [place] [facility] [place], [place] - [address] [place] [address] Phone: [phone] Fax: [phone]     Social Drivers of Health (SDOH) Social History: SDOH Screenings   Food Insecurity: Patient Unable To Answer (04/04/2024)  Housing: Patient Unable To Answer (04/04/2024)  Transportation Needs: Patient Unable To Answer (04/04/2024)  Utilities: Patient Unable To Answer (04/04/2024)  Alcohol Screen: Low Risk (03/23/2022)  Depression (PHQ2-9): Medium Risk (03/23/2022)  Financial Resource Strain: Low Risk (10/26/2022)  Physical Activity: Inactive (03/23/2022)  Social Connections: Patient Unable To Answer (04/04/2024)  Stress: No Stress Concern Present (03/23/2022)  Tobacco Use: Medium Risk (04/03/2024)   SDOH Interventions:     Readmission Risk Interventions    04/04/2024   11:53 AM 12/07/2022   10:54 AM  Readmission Risk Prevention Plan  Transportation Screening Complete Complete  PCP or Specialist Appt within 3-5 Days  Complete  Home Care Screening Complete   Medication Review (RN CM) Complete   HRI or Home Care Consult  Complete  Social Work Consult for Recovery Care Planning/Counseling  Complete  Palliative Care Screening  Not Applicable  Medication Review Oceanographer)  Not Complete  Med Review Comments  Will be reviewed by charge nurse  upon discharge

## 2024-04-04 NOTE — Plan of Care (Signed)
" °  Problem: Acute Rehab PT Goals(only PT should resolve) Goal: Pt Will Go Supine/Side To Sit Outcome: Progressing Flowsheets (Taken 04/04/2024 1456) Pt will go Supine/Side to Sit: with minimal assist Goal: Patient Will Transfer Sit To/From Stand Outcome: Progressing Flowsheets (Taken 04/04/2024 1456) Patient will transfer sit to/from stand: with minimal assist Goal: Pt Will Transfer Bed To Chair/Chair To Bed Outcome: Progressing Flowsheets (Taken 04/04/2024 1456) Pt will Transfer Bed to Chair/Chair to Bed: with min assist Goal: Pt Will Ambulate Outcome: Progressing Flowsheets (Taken 04/04/2024 1456) Pt will Ambulate:  25 feet  with minimal assist  with rolling walker   2:56 PM, 04/04/24 Lynwood Music, MPT Physical Therapist with [facility] 336 [phone] office [phone] mobile phone  "

## 2024-04-04 NOTE — H&P (Addendum)
 " History and Physical  Marilyn  JAISLYN Haas FMW:[id number] DOB: 06-Dec-1940 DOA: 04/03/2024  Referring physician: Dr. Dean, EDP  PCP: Orpha Yancey LABOR, MD  Outpatient Specialists: GI, ENT, hematology, cardiology. Patient coming from: Home.  Chief Complaint: Altered mental status.  HPI: Marilyn  H Haas is a 84 y.o. 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  She lives at home with her son who travels often.  He has paid helpers to feed and shower her, however the patient has not been eating her meals or cooperating with the helpers regarding taking showers.  The patient's son came home today and noticed urine and feces different places in the house.  The patient was confused.  Reportedly, she may have had a fall.  He called EMS.  No recent use of alcohol per the patient's son at bedside.  In the ER, alert and confused.  No diagnosis of dementia per the son at bedside.  Noncontrast head CT showed no acute intracranial abnormality.  It showed chronic microvascular ischemic change and mild diffuse cerebral volume loss.  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  Air-fluid level in the right sphenoid sinus which can be seen with acute sinusitis.  Serum sodium 113 from baseline of 127.  UA was negative for pyuria.  The patient received 1 L NS IV fluid bolus x 1 and IV Zofran  4 mg x 1 in the ER.  TRH, hospitalist service, was asked to admit for worsening chronic hyponatremia.  ED Course: Temperature 97.  BP 126/57, pulse 74, respiratory 14, O2 saturation 100% on room air.  Review of Systems: Review of systems as noted in the HPI. All other systems reviewed and  are negative.   Past Medical History:  Diagnosis Date   Anemia    Anxiety    Arthritis    Breast cancer (HCC) 05/19/2016   Right breast   Chronic back pain    Scoliosis, stenosis   Depression    Diverticulosis    GERD (gastroesophageal reflux disease)    Hard of hearing    History of blood transfusion    History of colon polyps    History of hiatal hernia    History of pneumonia    Hypertension    Insomnia    Osteopenia    Osteoporosis    Personal history of radiation therapy    Thickened endometrium 09/29/2017   Past Surgical History:  Procedure Laterality Date   BIOPSY  02/07/2022   Procedure: BIOPSY;  Surgeon: Eartha Angelia Sieving, MD;  Location: [facility];  Service: Gastroenterology;;   BREAST LUMPECTOMY WITH RADIOACTIVE SEED AND SENTINEL LYMPH NODE BIOPSY Right 07/31/2016   Procedure: BREAST LUMPECTOMY WITH RADIOACTIVE SEED AND SENTINEL LYMPH NODE BIOPSY, INJECT BLUE DYE RIGHT BREAST;  Surgeon: Gail Favorite, MD;  Location: [facility];  Service: General;  Laterality: Right;   BREAST LUMPECTOMY WITH RADIOACTIVE SEED LOCALIZATION Left 05/07/2021   Procedure: LEFT BREAST LUMPECTOMY WITH RADIOACTIVE SEED LOCALIZATION;  Surgeon: Vanderbilt Ned, MD;  Location: [facility];  Service: General;  Laterality: Left;   COLONOSCOPY     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 07/04/2014   Procedure: LEFT CONVERSION TO TOTAL HIP ARTHROPLASTY;  Surgeon: Dempsey  Aluisio, MD;  Location: [facility];  Service: Orthopedics;  Laterality: Left;   ESOPHAGEAL DILATION N/A 09/24/2017   Procedure: ESOPHAGEAL DILATION;  Surgeon: Golda Claudis PENNER, MD;  [facility]: [facility];  Service: Endoscopy;  Laterality: N/A;   ESOPHAGEAL DILATION N/A 01/19/2020   Procedure: ESOPHAGEAL DILATION;  Surgeon: Eartha Angelia Sieving, MD;  [facility]: [facility];  Service: Gastroenterology;  Laterality: N/A;   ESOPHAGEAL DILATION  02/28/2020   Procedure: ESOPHAGEAL DILATION;  Surgeon: Golda Claudis PENNER, MD;   [facility]: [facility];  Service: Endoscopy;;   ESOPHAGOGASTRODUODENOSCOPY N/A 01/26/2014   Procedure: ESOPHAGOGASTRODUODENOSCOPY (EGD);  Surgeon: Claudis PENNER Golda, MD;  [facility]: [facility];  Service: Endoscopy;  Laterality: N/A;  1030   ESOPHAGOGASTRODUODENOSCOPY (EGD) WITH PROPOFOL  N/A 09/24/2017   Procedure: ESOPHAGOGASTRODUODENOSCOPY (EGD) WITH PROPOFOL ;  Surgeon: Golda Claudis PENNER, MD;  [facility];  Service: Endoscopy;  Laterality: N/A;  11:15   ESOPHAGOGASTRODUODENOSCOPY (EGD) WITH PROPOFOL  N/A 01/19/2020   Procedure: ESOPHAGOGASTRODUODENOSCOPY (EGD) WITH PROPOFOL ;  Surgeon: Eartha Angelia Sieving, MD;  [facility];  Service: Gastroenterology;  Laterality: N/A;  1200   ESOPHAGOGASTRODUODENOSCOPY (EGD) WITH PROPOFOL  N/A 02/28/2020   Procedure: ESOPHAGOGASTRODUODENOSCOPY (EGD) WITH PROPOFOL ;  Surgeon: Golda Claudis PENNER, MD;  [facility];  Service: Endoscopy;  Laterality: N/A;  2:45   ESOPHAGOGASTRODUODENOSCOPY (EGD) WITH PROPOFOL  N/A 02/07/2022   Procedure: ESOPHAGOGASTRODUODENOSCOPY (EGD) WITH PROPOFOL ;  Surgeon: Eartha Angelia Sieving, MD;  [facility];  Service: Gastroenterology;  Laterality: N/A;   ESOPHAGOGASTRODUODENOSCOPY (EGD) WITH PROPOFOL  N/A 12/08/2022   Procedure: ESOPHAGOGASTRODUODENOSCOPY (EGD) WITH PROPOFOL ;  Surgeon: Unk Corinn Skiff, MD;  Location: [facility];  Service: Gastroenterology;  Laterality: N/A;   HEMOSTASIS CLIP PLACEMENT  12/08/2022   Procedure: HEMOSTASIS CLIP PLACEMENT;  Surgeon: Unk Corinn Skiff, MD;  Location: [facility];  Service: Gastroenterology;;   HEMOSTASIS CONTROL  12/08/2022   Procedure: HEMOSTASIS CONTROL;  Surgeon: Unk Corinn Skiff, MD;  Location: [facility];  Service: Gastroenterology;;   HIP FRACTURE SURGERY     05/2009 left -[place]   HYSTEROSCOPY WITH D & C  05/24/2018   Procedure: DILATATION AND CURETTAGE /HYSTEROSCOPY (PROCEDURE # 2)/PAP SMEAR (PROCEDURE #1);  Surgeon:  Edsel Norleen GAILS, MD;  Location: [facility];  Service: Gynecology;;   AGAPITO DILATION N/A 01/26/2014   Procedure: AGAPITO HODGKIN;  Surgeon: Claudis PENNER Golda, MD;  Location: [facility];  Service: Endoscopy;  Laterality: N/A;    Social History:  reports that she quit smoking about 63 years ago. Her smoking use included cigarettes. She started smoking about 65 years ago. She has a 1 pack-year smoking history. She has been exposed to tobacco smoke. She has never used smokeless tobacco. She reports that she does not currently use alcohol after a past usage of about 15.0 standard drinks of alcohol per week. She reports that she does not use drugs.   Allergies[1]  Family History  Problem Relation Age of Onset   Heart failure Mother    Diabetes Mother    Hypertension Mother    Depression Mother    Other Father        stomach ulcers; abd aneursym   Hypertension Sister    Heart failure Sister    Diabetes Sister    Depression Sister    Diabetes Sister        prediabetes   Cancer Maternal Aunt        breast      Prior to Admission medications  Medication Sig Start Date End  Date Taking? Authorizing Provider  acetaminophen  (TYLENOL ) 325 MG tablet Take 650 mg by mouth every 6 (six) hours as needed.    [provider]  ALPRAZolam  (XANAX ) 0.25 MG tablet Take 1 tablet (0.25 mg total) by mouth at bedtime as needed for anxiety. 12/11/22   Wouk, Devaughn Sayres, MD  ARIPiprazole  (ABILIFY ) 2 MG tablet Take 1 tablet (2 mg total) by mouth daily. 02/26/22   Landy Barnie RAMAN, NP  cyanocobalamin  1000 MCG tablet Take 1 tablet (1,000 mcg total) by mouth daily. 02/12/22   Evonnie Lenis, MD  DULoxetine  (CYMBALTA ) 30 MG capsule Take 4 capsules (120 mg total) by mouth daily for 7 days. 12/11/22 01/27/24  Wouk, Devaughn Sayres, MD  folic acid  (FOLVITE ) 1 MG tablet Take 1 tablet (1 mg total) by mouth daily. 02/26/22   Landy Barnie RAMAN, NP  furosemide  (LASIX ) 20 MG tablet Take 1 tablet (20 mg total) by mouth  daily. Patient not taking: Reported on 01/27/2024 01/22/23   Johnson Laymon HERO, PA-C  loperamide  (IMODIUM ) 2 MG capsule Take 1 capsule (2 mg total) by mouth 4 (four) times daily as needed for diarrhea or loose stools. 10/26/22   Leath-Warren, Etta PARAS, NP  losartan  (COZAAR ) 25 MG tablet TAKE ONE TABLET BY MOUTH TWICE A DAY 04/09/23   Debera Jayson MATSU, MD  mirtazapine  (REMERON ) 7.5 MG tablet Take 7.5 mg by mouth at bedtime.    [provider]  pantoprazole  (PROTONIX ) 40 MG tablet TAKE ONE TABLET (40MG  TOTAL) BY MOUTH TWO TIMES DAILY 07/01/23   Kennedy Charmaine CROME, NP  polyethylene glycol (MIRALAX  / GLYCOLAX ) 17 g packet Take 17 g by mouth daily. 12/12/22   Wouk, Devaughn Sayres, MD  sodium chloride  1 g tablet Take 1 tablet (1 g total) by mouth 3 (three) times daily with meals. 12/11/22   Wouk, Devaughn Sayres, MD  tamoxifen  (NOLVADEX ) 10 MG tablet TAKE ONE TABLET (10MG  TOTAL) BY MOUTH EVERY OTHER DAY 08/27/23   Iruku, Praveena, MD  thiamine  (VITAMIN B-1) 100 MG tablet Take 1 tablet (100 mg total) by mouth daily. 02/12/22   Evonnie Lenis, MD    Physical Exam: BP (!) 126/57   Pulse 74   Temp (!) 97 F (36.1 C) (Axillary)   Resp 14   Ht 5' 5 (1.651 m)   Wt 55.4 kg   SpO2 100%   BMI 20.32 kg/m   General: 84 y.o. year-old female well developed well nourished in no acute distress.  Alert and confused. Cardiovascular: Regular rate and rhythm with no rubs or gallops.  No thyromegaly or JVD noted.  Trace edema in lower extremities bilaterally. Respiratory: Clear to auscultation with no wheezes or rales. Good inspiratory effort. Abdomen: Soft nontender nondistended with normal bowel sounds x4 quadrants. Muskuloskeletal: No cyanosis or clubbing noted bilaterally Neuro: CN II-XII intact, strength, sensation, reflexes Skin: No ulcerative lesions noted or rashes Psychiatry: Judgement and insight appear altered. Mood is appropriate for condition and setting          Labs on Admission:  Basic  Metabolic Panel: Recent Labs  Lab 04/03/24 1853 04/04/24 0010 04/04/24 0501  NA 113* 113* 118*  K 3.9 3.9 3.8  CL 77* 83* 85*  CO2 18* 15* 20*  GLUCOSE 100* 87 83  BUN 13 13 12   CREATININE 0.78 0.67 0.69  CALCIUM 9.3 7.8* 7.9*   Liver Function Tests: Recent Labs  Lab 04/03/24 1853  AST 58*  ALT 57*  ALKPHOS 121  BILITOT 0.7  PROT 7.7  ALBUMIN  4.1   No results for input(s): LIPASE, AMYLASE in the last 168 hours. No results for input(s): AMMONIA in the last 168 hours. CBC: Recent Labs  Lab 04/03/24 1853  WBC 8.4  NEUTROABS 6.5  HGB 14.4  HCT 40.2  MCV 88.5  PLT 295   Cardiac Enzymes: No results for input(s): CKTOTAL, CKMB, CKMBINDEX, TROPONINI in the last 168 hours.  BNP (last 3 results) No results for input(s): BNP in the last 8760 hours.  ProBNP (last 3 results) No results for input(s): PROBNP in the last 8760 hours.  CBG: No results for input(s): GLUCAP in the last 168 hours.  Radiological Exams on Admission: CT Head Wo Contrast Result Date: 04/03/2024 EXAM: CT HEAD WITHOUT CONTRAST 04/03/2024 08:08:19 PM TECHNIQUE: CT of the head was performed without the administration of intravenous contrast. Automated exposure control, iterative reconstruction, and/or weight based adjustment of the mA/kV was utilized to reduce the radiation dose to as low as reasonably achievable. COMPARISON: MRI head 12/07/2022. CLINICAL HISTORY: Mental status change, unknown cause. FINDINGS: BRAIN AND VENTRICLES: No acute hemorrhage. No evidence of acute infarct. No hydrocephalus. No extra-axial collection. No mass effect or midline shift. Periventricular and subcortical white matter scattered low-density changes compatible with chronic microvascular ischemic change. Mild diffuse cerebral volume loss. The CT appearance is consistent with the remote lacunar infarct in the right paramedian pons noted on the prior MRI. ORBITS: No acute abnormality. SINUSES: Air-fluid level  in right sphenoid sinus. SOFT TISSUES AND SKULL: No acute soft tissue abnormality. No skull fracture. Atherosclerotic calcifications in skull base large vessels. IMPRESSION: 1. No acute intracranial abnormality. 2. Chronic microvascular ischemic change and mild diffuse cerebral volume loss. 3. 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 4. Air-fluid level in the right sphenoid sinus, which can be seen with acute sinusitis. Electronically signed by: Donnice Mania MD 04/03/2024 08:23 PM EST RP Workstation: [id number]   DG Chest Port 1 View Result Date: 04/03/2024 EXAM: 1 VIEW(S) XRAY OF THE CHEST 04/03/2024 07:01:30 PM COMPARISON: 12/07/2022 CLINICAL HISTORY: AMS (altered mental status) FINDINGS: LUNGS AND PLEURA: No focal pulmonary opacity. No pleural effusion. No pneumothorax. HEART AND MEDIASTINUM: Moderate hiatal hernia. No acute abnormality of the cardiac silhouette. BONES AND SOFT TISSUES: No acute osseous abnormality. IMPRESSION: 1. No acute findings. Electronically signed by: Pinkie Pebbles MD 04/03/2024 07:05 PM EST RP Workstation: [id number]    EKG: I independently viewed the EKG done and my findings are as followed: Sinus rhythm rate of 82.  QTc 449.  Assessment/Plan Present on Admission:  Hyponatremia  Principal Problem:   Hyponatremia  Worsening chronic hyponatremia Baseline serum sodium 127 Presented with serum sodium level of 113 Received IV NS at 50 cc/h x 12 hours Avoid rapid correction of sodium level Follow serum osmolality, urine osmolality, and urine sodium Encourage oral intake as tolerated. BMP every 4 hours x 3  History of breast cancer on tamoxifen  Tamoxifen  can cause hyponatremia Will need input from medical oncology regarding continuing home tamoxifen .  Acute metabolic encephalopathy secondary to the above Noncontrast head CT was nonacute, no evidence of intracranial bleed Reorient as needed Continue to treat underlying  conditions Fall precautions.  High anion gap metabolic acidosis Serum bicarb 18, anion gap 18 Lactic acid 1.5 IV fluid hydration Repeat BMP  Elevated liver chemistries AST 58, ALT 57.  Breast cancer on tamoxifen  Resume home regimen Follow-up with oncology outpatient.  History of stroke seen on MRI brain 12/07/2022 Currently not on antiplatelets or statin Monitor  on telemetry.  Generalized weakness with possible fall PT OT evaluation Fall precaution.   Critical care time: 55 minutes.   DVT prophylaxis: Subcu Lovenox  daily.  Code Status: Full code, per her son at bedside.  Family Communication: Updated the patient's son at bedside  Disposition Plan: Admitted to stepdown unit.  Consults called: None.  Admission status: Inpatient status.   Status is: Inpatient The patient requires at least 2 midnights for further evaluation and treatment of present condition.   Terry LOISE Hurst MD [facility] Pager [phone]  If 7PM-7AM, please contact night-coverage [email or web address] Password [email or web address]  04/04/2024, 6:16 AM      [1]  Allergies Allergen Reactions   Avelox [Moxifloxacin Hcl In Nacl] Other (See Comments)    Altered mental status   "

## 2024-04-04 NOTE — NC FL2 (Signed)
 " [place]  MEDICAID FL2 LEVEL OF CARE FORM     IDENTIFICATION  Patient Name: Marilyn  MISHELL Haas Birthdate: [birth year] Sex: female Admission Date (Current Location): 04/03/2024  [facility] and [name] Number:  [employer] and Address:  [facility],  [address] [place] [address]      Provider Number: [id number]  Attending Physician Name and Address:  Mcarthur Pick, MD  Relative Name and Phone Number:  Cuma Polyakov ( son) [phone]    Current Level of Care: Hospital Recommended Level of Care: Skilled Nursing Facility Prior Approval Number:    Date Approved/Denied:   PASRR Number:    Discharge Plan: SNF    Current Diagnoses: Patient Active Problem List   Diagnosis Date Noted   Duodenal ulcer 12/08/2022   Acute upper GI bleeding 12/08/2022   Hyponatremia 12/07/2022   Hypokalemia 12/07/2022   GERD without esophagitis 12/07/2022   Anxiety and depression 12/07/2022   History of right breast cancer 12/07/2022   Confusion 12/07/2022   Alcohol abuse 12/07/2022   Pressure injury of skin 12/07/2022   Frequent falls 02/14/2022   GI bleed 02/06/2022   Elevated BUN 02/06/2022   GERD (gastroesophageal reflux disease) 07/05/2021   Fall at home, initial encounter 07/04/2021   MDD (major depressive disorder), recurrent episode, moderate (HCC) 06/13/2018   Dysphagia 09/01/2017   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 (HCC) 08/13/2016   Malignant neoplasm of upper-outer quadrant of right breast in female, estrogen receptor positive (HCC) 06/22/2016   Osteopenia determined by x-ray 06/22/2016   Chronic hyponatremia 10/12/2015   PVC's (premature ventricular contractions) 12/16/2014   Acute on chronic blood loss anemia 08/09/2012   B12 deficiency anemia 08/09/2012   Vitamin D deficiency 08/09/2012   Anxiety 08/09/2012   Hyperlipidemia 05/25/2011   Essential hypertension 05/25/2011    Orientation RESPIRATION BLADDER Height & Weight      Self, Place  Normal Indwelling catheter Weight: 122 lb 2.2 oz (55.4 kg) Height:  5' 5 (165.1 cm)  BEHAVIORAL SYMPTOMS/MOOD NEUROLOGICAL BOWEL NUTRITION STATUS      Continent Diet (regular)  AMBULATORY STATUS COMMUNICATION OF NEEDS Skin   Extensive Assist Verbally Normal                       Personal Care Assistance Level of Assistance  Bathing, Feeding, Dressing Bathing Assistance: Maximum assistance Feeding assistance: Independent Dressing Assistance: Maximum assistance     Functional Limitations Info  Sight, Hearing, Speech Sight Info: Adequate Hearing Info: Adequate Speech Info: Adequate    SPECIAL CARE FACTORS FREQUENCY  PT (By licensed PT), OT (By licensed OT)     PT Frequency: 5 x a week OT Frequency: 5 x a week            Contractures Contractures Info: Not present    Additional Factors Info  Code Status, Allergies Code Status Info: Full Allergies Info: Avelox (moxifloxacin Hcl In Nacl)           Current Medications (04/04/2024):  This is the current hospital active medication list Current Facility-Administered Medications  Medication Dose Route Frequency Provider Last Rate Last Admin   acetaminophen  (TYLENOL ) tablet 500 mg  500 mg Oral Q6H PRN Hall, Carole N, DO   500 mg at 04/04/24 9076   Chlorhexidine  Gluconate Cloth 2 % PADS 6 each  6 each Topical Daily Shona Terry SAILOR, DO   6 each at 04/04/24 9076   dextrose  5 % solution   Intravenous  Continuous Sigdel, Santosh, MD 25 mL/hr at 04/04/24 0934 New Bag at 04/04/24 0934   DULoxetine  (CYMBALTA ) DR capsule 120 mg  120 mg Oral Daily Shona Terry SAILOR, DO   120 mg at 04/04/24 9076   enoxaparin  (LOVENOX ) injection 40 mg  40 mg Subcutaneous Q24H Shona Terry N, DO   40 mg at 04/04/24 9077   HYDROcodone -acetaminophen  (NORCO/VICODIN) 5-325 MG per tablet 1 tablet  1 tablet Oral Q6H PRN Sigdel, Santosh, MD       melatonin tablet 6 mg  6 mg Oral QHS PRN Hall, Carole N, DO   6 mg at 04/04/24 0010   Oral care mouth  rinse  15 mL Mouth Rinse PRN Shona Terry N, DO       polyethylene glycol (MIRALAX  / GLYCOLAX ) packet 17 g  17 g Oral Daily PRN Shona Terry N, DO       prochlorperazine  (COMPAZINE ) injection 5 mg  5 mg Intravenous Q6H PRN Hall, Carole N, DO   5 mg at 04/04/24 9964     Discharge Medications: Please see discharge summary for a list of discharge medications.  Relevant Imaging Results:  Relevant Lab Results:   Additional Information SS#: [id number]  Noreen KATHEE Pinal, LCSWA     "

## 2024-04-04 NOTE — Evaluation (Signed)
 Occupational Therapy Evaluation Patient Details Name: Marilyn Haas MRN: [id number] DOB: 03-13-1941 Today's Date: 04/04/2024   History of Present Illness   Marilyn Haas is a 84 y.o. 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  She lives at home with her son who travels often.  He has paid helpers to feed and shower her, however the patient has not been eating her meals or cooperating with the helpers regarding taking showers.  The patient's son came home today and noticed urine and feces different places in the house.  The patient was confused.  Reportedly, she may have had a fall.  He called EMS.  No recent use of alcohol per the patient's son at bedside. (per DO)     Clinical Impressions Pt agreeable to OT and PT co-evaluation. Per son's report, pt has been struggling to care for herself recently. Pt is home alone at times with son traveling for work. Pt required mod A with RW for mobility to chair and short distance ambulation with RW. Mod to max A needed for bed mobility as well. B UE generally weak with pt needing set up to  min A for upper body ADL's and around max A for lower body dressing and bathing. Pt left in the chair with family present. Pt will benefit from continued OT in the hospital to increase strength, balance, and endurance for safe ADL's.        If plan is discharge home, recommend the following:   A lot of help with walking and/or transfers;A lot of help with bathing/dressing/bathroom;Assistance with cooking/housework;Direct supervision/assist for medications management;Assist for transportation;Help with stairs or ramp for entrance     Functional  Status Assessment   Patient has had a recent decline in their functional status and demonstrates the ability to make significant improvements in function in a reasonable and predictable amount of time.     Equipment Recommendations   None recommended by OT     Recommendations for Other Services         Precautions/Restrictions   Precautions Precautions: Fall Recall of Precautions/Restrictions: Impaired Restrictions Weight Bearing Restrictions Per Provider Order: No     Mobility Bed Mobility Overal bed mobility: Needs Assistance Bed Mobility: Supine to Sit     Supine to sit: Mod assist, Max assist     General bed mobility comments: Labored effort; assist for B LE mobility and trunk control.    Transfers Overall transfer level: Needs assistance Equipment used: Rolling walker (2 wheels) Transfers: Sit to/from Stand, Bed to chair/wheelchair/BSC Sit to Stand: Mod assist     Step pivot transfers: Mod assist     General transfer comment: EOB to chair with RW      Balance Overall balance assessment: Needs assistance Sitting-balance support: Feet supported, Bilateral upper extremity supported Sitting balance-Leahy Scale: Fair Sitting balance - Comments: seated at EOB   Standing balance support: Bilateral upper extremity supported, During functional activity, Reliant on assistive device for balance Standing balance-Leahy Scale: Poor Standing balance comment: using RW                           ADL either performed or assessed with clinical judgement  ADL Overall ADL's : Needs assistance/impaired     Grooming: Set up;Sitting   Upper Body Bathing: Set up;Sitting;Minimal assistance   Lower Body Bathing: mod to Maximal assistance;Sitting/lateral leans   Upper Body Dressing : Set up;Minimal assistance;Sitting   Lower Body Dressing: Maximal assistance;Total assistance;Bed level Lower Body Dressing Details (indicate cue type and reason): Unable  to don sock at bed level without assist. Toilet Transfer: Moderate assistance;Stand-pivot;Rolling walker (2 wheels);Ambulation Toilet Transfer Details (indicate cue type and reason): EOB to chair with RW and short distance ambulation in the room with RW. Toileting- Clothing Manipulation and Hygiene: Maximal assistance;Moderate assistance;Sitting/lateral lean;Bed level       Functional mobility during ADLs: Moderate assistance;Rolling walker (2 wheels) General ADL Comments: Pt ambulated ~8 feet total forward and backward in the room with RW.     Vision Baseline Vision/History: 1 Wears glasses Ability to See in Adequate Light: 1 Impaired Vision Assessment?: Wears glasses for reading (Uses a magnifying glass at baseline. No obvious impairments today. Will continue to monitor.)     Perception Perception: Not tested       Praxis Praxis: Not tested       Pertinent Vitals/Pain Pain Assessment Pain Assessment: Faces Faces Pain Scale: Hurts a little bit Pain Location: back during bed mobility Pain Descriptors / Indicators: Discomfort Pain Intervention(s): Monitored during session, Repositioned     Extremity/Trunk Assessment Upper Extremity Assessment Upper Extremity Assessment: Generalized weakness   Lower Extremity Assessment Lower Extremity Assessment: Defer to PT evaluation   Cervical / Trunk Assessment Cervical / Trunk Assessment: Kyphotic   Communication Communication Communication: Impaired Factors Affecting Communication: Reduced clarity of speech   Cognition Arousal: Alert Behavior During Therapy: WFL for tasks assessed/performed Cognition: History of cognitive impairments, Cognition impaired   Orientation impairments: Time, Situation         OT - Cognition Comments: Pt's son reports that her cognition comes and goes depending on the day. Today pt was not fully oriented but could follow commands.                 Following commands: Intact        Cueing  General Comments   Cueing Techniques: Verbal cues;Tactile cues                 Home Living Family/patient expects to be discharged to:: Private residence Living Arrangements: Children Available Help at Discharge: Family;Available PRN/intermittently Type of Home: House Home Access: Stairs to enter [employer] of Steps: 2 Entrance Stairs-Rails: Right Home Layout: Multi-level;Able to live on main level with bedroom/bathroom     Bathroom Shower/Tub: Producer, Television/film/video: Standard Bathroom Accessibility: No   Home Equipment: Agricultural Consultant (2 wheels);Rollator (4 wheels);Shower seat          Prior Functioning/Environment Prior Level of Function : Needs assist       Physical Assist : ADLs (physical);Mobility (physical) Mobility (physical): Transfers;Gait ADLs (physical): IADLs;Dressing;Bathing Mobility Comments: Household ambulation with rollator. ADLs Comments: Pt's son reported that she has not recently been getting help, but that she does not seem to be caring for herself well. Son stated he has been finding soiled clothes and noting that pt is not eating or drinking food that he leaves for the pt. Pt did have PCA's but does not anymore.    OT Problem List: Decreased strength;Decreased activity tolerance;Impaired balance (sitting and/or standing);Decreased cognition;Decreased safety awareness   OT Treatment/Interventions: Self-care/ADL training;Therapeutic exercise;DME and/or AE instruction;Therapeutic activities;Patient/family education;Balance training;Cognitive remediation/compensation  OT Goals(Current goals can be found in the care plan section)   Acute Rehab OT Goals Patient Stated Goal: Improve function. OT Goal Formulation: With patient/family Time For Goal Achievement: 04/18/24 Potential to Achieve Goals: Fair   OT Frequency:  Min 2X/week    Co-evaluation PT/OT/SLP Co-Evaluation/Treatment: Yes Reason for  Co-Treatment: To address functional/ADL transfers   OT goals addressed during session: ADL's and self-care                       End of Session Equipment Utilized During Treatment: Rolling walker (2 wheels)  Activity Tolerance: Patient tolerated treatment well Patient left: in chair;with call bell/phone within reach;with family/visitor present  OT Visit Diagnosis: Unsteadiness on feet (R26.81);Other abnormalities of gait and mobility (R26.89);Muscle weakness (generalized) (M62.81);History of falling (Z91.81);Other symptoms and signs involving cognitive function                Time: 9145-9080 OT Time Calculation (min): 25 min Charges:  OT General Charges $OT Visit: 1 Visit OT Evaluation $OT Eval Low Complexity: 1 Low  Marilyn Haas OT, MOT  Jayson Person 04/04/2024, 11:47 AM

## 2024-04-05 DIAGNOSIS — E871 Hypo-osmolality and hyponatremia: Secondary | ICD-10-CM | POA: Diagnosis not present

## 2024-04-05 LAB — CBC WITH DIFFERENTIAL/PLATELET
Abs Immature Granulocytes: 0.06 K/uL (ref 0.00–0.07)
Basophils Absolute: 0 K/uL (ref 0.0–0.1)
Basophils Relative: 1 %
Eosinophils Absolute: 0.1 K/uL (ref 0.0–0.5)
Eosinophils Relative: 1 %
HCT: 27.3 % — ABNORMAL LOW (ref 36.0–46.0)
Hemoglobin: 9.6 g/dL — ABNORMAL LOW (ref 12.0–15.0)
Immature Granulocytes: 1 %
Lymphocytes Relative: 11 %
Lymphs Abs: 0.7 K/uL (ref 0.7–4.0)
MCH: 31.6 pg (ref 26.0–34.0)
MCHC: 35.2 g/dL (ref 30.0–36.0)
MCV: 89.8 fL (ref 80.0–100.0)
Monocytes Absolute: 0.6 K/uL (ref 0.1–1.0)
Monocytes Relative: 9 %
Neutro Abs: 5 K/uL (ref 1.7–7.7)
Neutrophils Relative %: 77 %
Platelets: 194 K/uL (ref 150–400)
RBC: 3.04 MIL/uL — ABNORMAL LOW (ref 3.87–5.11)
RDW: 12.3 % (ref 11.5–15.5)
WBC: 6.5 K/uL (ref 4.0–10.5)
nRBC: 0 % (ref 0.0–0.2)

## 2024-04-05 LAB — BASIC METABOLIC PANEL WITH GFR
Anion gap: 11 (ref 5–15)
Anion gap: 10 (ref 5–15)
Anion gap: 10 (ref 5–15)
Anion gap: 11 (ref 5–15)
Anion gap: 10 (ref 5–15)
BUN: 14 mg/dL (ref 8–23)
BUN: 13 mg/dL (ref 8–23)
BUN: 12 mg/dL (ref 8–23)
BUN: 12 mg/dL (ref 8–23)
BUN: 11 mg/dL (ref 8–23)
CO2: 18 mmol/L — ABNORMAL LOW (ref 22–32)
CO2: 21 mmol/L — ABNORMAL LOW (ref 22–32)
CO2: 17 mmol/L — ABNORMAL LOW (ref 22–32)
CO2: 17 mmol/L — ABNORMAL LOW (ref 22–32)
CO2: 17 mmol/L — ABNORMAL LOW (ref 22–32)
Calcium: 7.7 mg/dL — ABNORMAL LOW (ref 8.9–10.3)
Calcium: 7.6 mg/dL — ABNORMAL LOW (ref 8.9–10.3)
Calcium: 7.3 mg/dL — ABNORMAL LOW (ref 8.9–10.3)
Calcium: 7.4 mg/dL — ABNORMAL LOW (ref 8.9–10.3)
Calcium: 7.4 mg/dL — ABNORMAL LOW (ref 8.9–10.3)
Chloride: 86 mmol/L — ABNORMAL LOW (ref 98–111)
Chloride: 84 mmol/L — ABNORMAL LOW (ref 98–111)
Chloride: 85 mmol/L — ABNORMAL LOW (ref 98–111)
Chloride: 85 mmol/L — ABNORMAL LOW (ref 98–111)
Chloride: 86 mmol/L — ABNORMAL LOW (ref 98–111)
Creatinine, Ser: 0.81 mg/dL (ref 0.44–1.00)
Creatinine, Ser: 0.77 mg/dL (ref 0.44–1.00)
Creatinine, Ser: 0.66 mg/dL (ref 0.44–1.00)
Creatinine, Ser: 0.71 mg/dL (ref 0.44–1.00)
Creatinine, Ser: 0.62 mg/dL (ref 0.44–1.00)
GFR, Estimated: >60 mL/min
GFR, Estimated: >60 mL/min
GFR, Estimated: >60 mL/min
GFR, Estimated: >60 mL/min
GFR, Estimated: >60 mL/min
Glucose, Bld: 121 mg/dL — ABNORMAL HIGH (ref 70–99)
Glucose, Bld: 118 mg/dL — ABNORMAL HIGH (ref 70–99)
Glucose, Bld: 92 mg/dL (ref 70–99)
Glucose, Bld: 133 mg/dL — ABNORMAL HIGH (ref 70–99)
Glucose, Bld: 113 mg/dL — ABNORMAL HIGH (ref 70–99)
Potassium: 3.7 mmol/L (ref 3.5–5.1)
Potassium: 3.6 mmol/L (ref 3.5–5.1)
Potassium: 3.7 mmol/L (ref 3.5–5.1)
Potassium: 3.5 mmol/L (ref 3.5–5.1)
Potassium: 3.7 mmol/L (ref 3.5–5.1)
Sodium: 115 mmol/L — CL (ref 135–145)
Sodium: 115 mmol/L — CL (ref 135–145)
Sodium: 113 mmol/L — CL (ref 135–145)
Sodium: 113 mmol/L — CL (ref 135–145)
Sodium: 113 mmol/L — CL (ref 135–145)

## 2024-04-05 MED ORDER — SODIUM CHLORIDE 1 G PO TABS
1.0000 g | ORAL_TABLET | Freq: Three times a day (TID) | ORAL | Status: DC
  Administered 2024-04-05 – 2024-04-07 (×5): 1 g via ORAL
  Filled 2024-04-05 (×5): qty 1

## 2024-04-05 MED ORDER — PANTOPRAZOLE SODIUM 40 MG PO TBEC
40.0000 mg | DELAYED_RELEASE_TABLET | Freq: Two times a day (BID) | ORAL | Status: DC
  Administered 2024-04-05 – 2024-04-13 (×17): 40 mg via ORAL
  Filled 2024-04-05 (×17): qty 1

## 2024-04-05 MED ORDER — VENLAFAXINE HCL 37.5 MG PO TABS
37.5000 mg | ORAL_TABLET | Freq: Two times a day (BID) | ORAL | Status: DC
  Administered 2024-04-05 – 2024-04-06 (×2): 37.5 mg via ORAL
  Filled 2024-04-05 (×2): qty 1

## 2024-04-05 MED ORDER — CITALOPRAM HYDROBROMIDE 20 MG PO TABS
20.0000 mg | ORAL_TABLET | Freq: Every day | ORAL | Status: DC
  Administered 2024-04-05 – 2024-04-13 (×9): 20 mg via ORAL
  Filled 2024-04-05 (×9): qty 1

## 2024-04-05 MED ORDER — ARIPIPRAZOLE 5 MG PO TABS
5.0000 mg | ORAL_TABLET | Freq: Every day | ORAL | Status: DC
  Administered 2024-04-05 – 2024-04-13 (×9): 5 mg via ORAL
  Filled 2024-04-05 (×8): qty 1

## 2024-04-05 MED ORDER — SODIUM CHLORIDE 0.9 % IV SOLN: INTRAVENOUS | Status: DC

## 2024-04-05 NOTE — Progress Notes (Signed)
 Date and time results received: 04/05/24 1900 (use smartphrase .now to insert current time)  Test: Sodium Critical Value: 113   Name of Provider Notified: Dr. Mcarthur  Orders Received? Or Actions Taken?: MD notified. Sodium chloride  tablet given. NS still running at 100 ml/hr

## 2024-04-05 NOTE — TOC Progression Note (Signed)
 Transition of Care [facility]) - Progression Note    Patient Details  Name: Marilyn  HAYLYN Haas MRN: [id number] Date of Birth: 03-15-1941  Transition of Care [facility] CM/SW Contact  Lucie Lunger, [place] Phone Number: 04/05/2024, 1:29 PM  Clinical Narrative:    CSW spoke with pts son to review bed offers for SNF, pts son states they will accept bed at [facility] CSW updated HUB to reflect this choice and updated Debbie in admissions. TOC to follow.   Expected Discharge Plan: Skilled Nursing Facility Barriers to Discharge: Continued Medical Work up               Expected Discharge Plan and Services In-house Referral: Clinical Social Work   Post Acute Care Choice: Durable Medical Equipment Living arrangements for the past 2 months: Single Family Home                                       Social Drivers of Health (SDOH) Interventions SDOH Screenings   Food Insecurity: Patient Unable To Answer (04/04/2024)  Housing: Patient Unable To Answer (04/04/2024)  Transportation Needs: Patient Unable To Answer (04/04/2024)  Utilities: Patient Unable To Answer (04/04/2024)  Alcohol Screen: Low Risk (03/23/2022)  Depression (PHQ2-9): Medium Risk (03/23/2022)  Financial Resource Strain: Low Risk (10/26/2022)  Physical Activity: Inactive (03/23/2022)  Social Connections: Patient Unable To Answer (04/04/2024)  Stress: No Stress Concern Present (03/23/2022)  Tobacco Use: Medium Risk (04/03/2024)    Readmission Risk Interventions    04/04/2024   11:53 AM 12/07/2022   10:54 AM  Readmission Risk Prevention Plan  Transportation Screening Complete Complete  PCP or Specialist Appt within 3-5 Days  Complete  Home Care Screening Complete   Medication Review (RN CM) Complete   HRI or Home Care Consult  Complete  Social Work Consult for Recovery Care Planning/Counseling  Complete  Palliative Care Screening  Not Applicable  Medication Review Oceanographer)  Not Complete  Med Review Comments   Will be reviewed by charge nurse upon discharge

## 2024-04-05 NOTE — Progress Notes (Signed)
 Date and time results received: 04/05/24 0715 (use smartphrase .now to insert current time)  Test: Sodium Critical Value: 115  Name of Provider Notified: Dr. Mcarthur  Orders Received? Or Actions Taken?: MD notified. Awaiting orders

## 2024-04-05 NOTE — Plan of Care (Signed)

## 2024-04-05 NOTE — Progress Notes (Signed)
 " PROGRESS NOTE    Marilyn Haas  Marilyn Haas  FMW:[id number] DOB: 09/12/1940 DOA: 04/03/2024 PCP: Orpha Yancey LABOR, MD   Brief Narrative:    Marilyn Haas  Marilyn Haas is a 84 y.o. 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  She lives at home with her son who travels often.  He has paid helpers to feed and shower her, however the patient has not been eating her meals or cooperating with the helpers regarding taking showers.  The patient's son came home today and noticed urine and feces different places in the house.  The patient was confused.  Reportedly, she may have had a fall.  He called EMS.  No recent use of alcohol per the patient's son at bedside.   In the ER, alert and confused.  No diagnosis of dementia per the son at bedside.  Noncontrast head CT showed no acute intracranial abnormality.  It showed chronic microvascular ischemic change and mild diffuse cerebral volume loss.  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  Air-fluid level in the right sphenoid sinus which can be seen with acute sinusitis.  Serum sodium 113 from baseline of 127.  UA was negative for pyuria.   The patient received 1 L NS IV fluid bolus x 1 and IV Zofran  4 mg x 1 in the ER.  TRH, hospitalist service, was asked to admit for worsening chronic hyponatremia.   ED Course: Temperature 97.  BP 126/57, pulse 74, respiratory 14, O2 saturation 100% on room air.    Assessment & Plan:   Principal Problem:   Hyponatremia Active Problems:   Hypokalemia   Malignant neoplasm of upper-outer quadrant of right breast in female, estrogen receptor positive (HCC)   Frequent falls   Anxiety and  depression   Worsening chronic hyponatremia Baseline serum sodium 127 Presented with serum sodium level of 113--> improved rapidly to 120 after normal saline bolus--> received dextrose  briefly and sodium down to 115. Urine osmolality 160, urine sodium less than 30.?Beer potomania Will start slow IV infusion of normal saline with close monitoring of BMP.  history of breast cancer on tamoxifen  Tamoxifen  can cause hyponatremia Will need input from medical oncology regarding continuing home tamoxifen .   Acute metabolic encephalopathy secondary to the above Noncontrast head CT was nonacute, no evidence of intracranial bleed Reorient as needed Continue to treat underlying conditions Fall precautions.   Mild non-anion gap acidosis. Improving.  Creatinine is stable at 0.8   elevated liver chemistries AST 58, ALT 57.   Breast cancer on tamoxifen  Resume home regimen Follow-up with oncology outpatient.   History of stroke seen on MRI brain 12/07/2022 Currently not on antiplatelets or statin Monitor on telemetry.   Generalized weakness with possible fall PT OT evaluation Fall precaution. Referred for SNF, TOC  Chronic back pain: Will order for Norco as needed.  Son said Tylenol  didn't help.      DVT prophylaxis: Subcu Lovenox  daily.   Code Status: Full code, per her son at bedside.   Family Communication: Updated the patient's son at bedside   Disposition Plan: Admitted to stepdown unit.   Consults called: None.   Admission status: Inpatient status.     Status is: Inpatient The  patient requires at least 2 midnights for further evaluation and treatment of present condition.   Subjective:  Patient seen and examined at the bedside earlier today.  No family at the bedside.  She was alert, oriented x 2.  Denied any pain.  Appeared somewhat confused.  Vital signs stable.  Sodium down to 115  Objective: Vitals:   04/05/24 0800 04/05/24 0900 04/05/24 1000 04/05/24 1116   BP: 123/76 121/73 (!) 149/87   Pulse: 76 77 77   Resp: (!) 31 17 16    Temp:    97.7 F (36.5 C)  TempSrc:    Oral  SpO2: 98% 99% 100%   Weight:      Height:        Intake/Output Summary (Last 24 hours) at 04/05/2024 1355 Last data filed at 04/05/2024 9376 Gross per 24 hour  Intake 895.63 ml  Output 400 ml  Net 495.63 ml   Filed Weights   04/03/24 1839 04/03/24 2248  Weight: 59.8 kg 55.4 kg    Examination:  General exam: Alert, calm and comfortable, oriented x 2 Respiratory system: Bilateral decreased breath sounds at bases Cardiovascular system: S1 & S2 heard, Rate controlled Gastrointestinal system: Abdomen is nondistended, soft and nontender. Normal bowel sounds heard. Extremities: No cyanosis, clubbing, edema  Central nervous system: Alert and orientedx2. No focal neurological deficits. Moving extremities    Data Reviewed: I have personally reviewed following labs and imaging studies  CBC: Recent Labs  Lab 04/03/24 1853 04/05/24 0302  WBC 8.4 6.5  NEUTROABS 6.5 5.0  HGB 14.4 9.6*  HCT 40.2 27.3*  MCV 88.5 89.8  PLT 295 194   Basic Metabolic Panel: Recent Labs  Lab 04/04/24 1152 04/04/24 1556 04/04/24 2226 04/05/24 0302 04/05/24 0636  NA 120* 120* 117* 115* 115*  K 3.6 3.7 3.9 3.7 3.6  CL 89* 90* 88* 86* 84*  CO2 19* 18* 17* 18* 21*  GLUCOSE 111* 113* 112* 121* 118*  BUN 11 10 14 14 13   CREATININE 0.80 0.68 0.86 0.81 0.77  CALCIUM 8.0* 7.9* 7.4* 7.7* 7.6*   GFR: Estimated Creatinine Clearance: 45 mL/min (by C-G formula based on SCr of 0.77 mg/dL). Liver Function Tests: Recent Labs  Lab 04/03/24 1853  AST 58*  ALT 57*  ALKPHOS 121  BILITOT 0.7  PROT 7.7  ALBUMIN 4.1   No results for input(s): LIPASE, AMYLASE in the last 168 hours. No results for input(s): AMMONIA in the last 168 hours. Coagulation Profile: No results for input(s): INR, PROTIME in the last 168 hours. Cardiac Enzymes: No results for input(s): CKTOTAL,  CKMB, CKMBINDEX, TROPONINI in the last 168 hours. BNP (last 3 results) No results for input(s): PROBNP in the last 8760 hours. HbA1C: No results for input(s): HGBA1C in the last 72 hours. CBG: No results for input(s): GLUCAP in the last 168 hours. Lipid Profile: No results for input(s): CHOL, HDL, LDLCALC, TRIG, CHOLHDL, LDLDIRECT in the last 72 hours. Thyroid  Function Tests: No results for input(s): TSH, T4TOTAL, FREET4, T3FREE, THYROIDAB in the last 72 hours. Anemia Panel: No results for input(s): VITAMINB12, FOLATE, FERRITIN, TIBC, IRON, RETICCTPCT in the last 72 hours. Sepsis Labs: Recent Labs  Lab 04/03/24 1841  LATICACIDVEN 1.5    Recent Results (from the past 240 hours)  Culture, blood (routine x 2)     Status: None (Preliminary result)   Collection Time: 04/03/24  6:41 PM   Specimen: BLOOD  Result Value Ref Range Status   Specimen Description BLOOD  Final  Special Requests NONE  Final   Culture   Final    NO GROWTH 2 DAYS Performed at [facility], [address], [place], [place] [address]    Report Status PENDING  Incomplete  Culture, blood (routine x 2)     Status: None (Preliminary result)   Collection Time: 04/03/24  6:46 PM   Specimen: BLOOD  Result Value Ref Range Status   Specimen Description BLOOD  Final   Special Requests NONE  Final   Culture   Final    NO GROWTH 2 DAYS Performed at [facility], [address], [place], [place] [address]    Report Status PENDING  Incomplete  MRSA Next Gen by PCR, Nasal     Status: None   Collection Time: 04/03/24 10:06 PM   Specimen: Nasal Mucosa; Nasal Swab  Result Value Ref Range Status   MRSA by PCR Next Gen NOT DETECTED NOT DETECTED Final    Comment: (NOTE) The GeneXpert MRSA Assay (FDA approved for NASAL specimens only), is one component of a comprehensive MRSA colonization surveillance program. It is not intended to diagnose MRSA infection nor to guide or  monitor treatment for MRSA infections. Test performance is not FDA approved in patients less than 25 years old. Performed at [facility], [address] [place], [place] [address]          Radiology Studies: CT Head Wo Contrast Result Date: 04/03/2024 EXAM: CT HEAD WITHOUT CONTRAST 04/03/2024 08:08:19 PM TECHNIQUE: CT of the head was performed without the administration of intravenous contrast. Automated exposure control, iterative reconstruction, and/or weight based adjustment of the mA/kV was utilized to reduce the radiation dose to as low as reasonably achievable. COMPARISON: MRI head 12/07/2022. CLINICAL HISTORY: Mental status change, unknown cause. FINDINGS: BRAIN AND VENTRICLES: No acute hemorrhage. No evidence of acute infarct. No hydrocephalus. No extra-axial collection. No mass effect or midline shift. Periventricular and subcortical white matter scattered low-density changes compatible with chronic microvascular ischemic change. Mild diffuse cerebral volume loss. The CT appearance is consistent with the remote lacunar infarct in the right paramedian pons noted on the prior MRI. ORBITS: No acute abnormality. SINUSES: Air-fluid level in right sphenoid sinus. SOFT TISSUES AND SKULL: No acute soft tissue abnormality. No skull fracture. Atherosclerotic calcifications in skull base large vessels. IMPRESSION: 1. No acute intracranial abnormality. 2. Chronic microvascular ischemic change and mild diffuse cerebral volume loss. 3. 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 4. Air-fluid level in the right sphenoid sinus, which can be seen with acute sinusitis. Electronically signed by: Donnice Mania MD 04/03/2024 08:23 PM EST RP Workstation: [id number]   DG Chest Port 1 View Result Date: 04/03/2024 EXAM: 1 VIEW(S) XRAY OF THE CHEST 04/03/2024 07:01:30 PM COMPARISON: 12/07/2022 CLINICAL HISTORY: AMS (altered mental status) FINDINGS: LUNGS AND PLEURA: No focal pulmonary  opacity. No pleural effusion. No pneumothorax. HEART AND MEDIASTINUM: Moderate hiatal hernia. No acute abnormality of the cardiac silhouette. BONES AND SOFT TISSUES: No acute osseous abnormality. IMPRESSION: 1. No acute findings. Electronically signed by: Pinkie Pebbles MD 04/03/2024 07:05 PM EST RP Workstation: [id number]        Scheduled Meds:  Chlorhexidine  Gluconate Cloth  6 each Topical Daily   DULoxetine   120 mg Oral Daily   enoxaparin  (LOVENOX ) injection  40 mg Subcutaneous Q24H   Continuous Infusions:  sodium chloride  100 mL/hr at 04/05/24 0754          Derryl Duval, MD [facility] 04/05/2024, 1:55 PM   "

## 2024-04-05 NOTE — Plan of Care (Signed)
   Problem: Clinical Measurements: Goal: Ability to maintain clinical measurements within normal limits will improve Outcome: Progressing Goal: Will remain free from infection Outcome: Progressing Goal: Diagnostic test results will improve Outcome: Progressing Goal: Respiratory complications will improve Outcome: Progressing Goal: Cardiovascular complication will be avoided Outcome: Progressing   Problem: Coping: Goal: Level of anxiety will decrease Outcome: Progressing

## 2024-04-05 NOTE — Progress Notes (Signed)
 Physical Therapy Treatment Patient Details Name: Marilyn Haas MRN: [id number] DOB: [birth year] Today's Date: 04/05/2024   History of Present Illness Marilyn Haas is a 84 y.o. 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  She lives at home with her son who travels often.  He has paid helpers to feed and shower her, however the patient has not been eating her meals or cooperating with the helpers regarding taking showers.  The patient's son came home today and noticed urine and feces different places in the house.  The patient was confused.  Reportedly, she may have had a fall.  He called EMS.  No recent use of alcohol per the patient's son at bedside.    PT Comments  Patient demonstrates increased endurance distance for taking steps at bedside and tolerated 2 trials of 12 feet at bedside before having to sit due to c/o fatigue. Patient put back to bed after therapy per RN request due to low sodium and possible risk of seizures. Patient will benefit from continued skilled physical therapy in hospital and recommended venue below to increase strength, balance, endurance for safe ADLs and gait.    If plan is discharge home, recommend the following: A lot of help with bathing/dressing/bathroom;A lot of help with walking and/or transfers;Help with stairs or ramp for entrance;Assist for transportation;Assistance with cooking/housework   Can travel by private vehicle     No  Equipment Recommendations  None recommended by PT    Recommendations for Other Services       Precautions / Restrictions Precautions Precautions: Fall Recall of Precautions/Restrictions:  Impaired Restrictions Weight Bearing Restrictions Per Provider Order: No     Mobility  Bed Mobility Overal bed mobility: Needs Assistance Bed Mobility: Supine to Sit     Supine to sit: Mod assist, Max assist     General bed mobility comments: incresed time, labored movement    Transfers Overall transfer level: Needs assistance Equipment used: Rolling walker (2 wheels) Transfers: Sit to/from Stand, Bed to chair/wheelchair/BSC Sit to Stand: Mod assist   Step pivot transfers: Mod assist       General transfer comment: slow labored movement with frequent posterior leaning during transfer to chair    Ambulation/Gait Ambulation/Gait assistance: Mod assist Gait Distance (Feet): 12 Feet Assistive device: Rolling walker (2 wheels) Gait Pattern/deviations: Decreased step length - right, Decreased step length - left, Decreased stride length Gait velocity: decreased     General Gait Details: slow labored movment requiring verbal/tacile cueing to lean on RW with BUE to avoid posterior leaning, limited mostly due to fatigue   Stairs             Wheelchair Mobility     Tilt Bed    Modified Rankin (Stroke Patients Only)       Balance Overall balance assessment: Needs assistance Sitting-balance support: Feet supported, No upper extremity supported Sitting balance-Leahy Scale: Fair Sitting balance - Comments: seated at EOB   Standing balance support: Reliant on assistive device for balance, During functional activity, Bilateral upper extremity supported Standing balance-Leahy Scale: Poor Standing balance comment: using RW  Communication Communication Communication: No apparent difficulties  Cognition Arousal: Alert Behavior During Therapy: Anxious, WFL for tasks assessed/performed                             Following commands: Intact      Cueing Cueing Techniques: Verbal cues, Tactile cues  Exercises       General Comments        Pertinent Vitals/Pain Pain Assessment Pain Assessment: No/denies pain    Home Living                          Prior Function            PT Goals (current goals can now be found in the care plan section) Acute Rehab PT Goals Patient Stated Goal: return home after rehab PT Goal Formulation: With patient/family Time For Goal Achievement: 04/18/24 Potential to Achieve Goals: Good Progress towards PT goals: Progressing toward goals    Frequency    Min 3X/week      PT Plan      Co-evaluation              AM-PAC PT 6 Clicks Mobility   Outcome Measure  Help needed turning from your back to your side while in a flat bed without using bedrails?: A Lot Help needed moving from lying on your back to sitting on the side of a flat bed without using bedrails?: A Lot Help needed moving to and from a bed to a chair (including a wheelchair)?: A Lot Help needed standing up from a chair using your arms (e.g., wheelchair or bedside chair)?: A Lot Help needed to walk in hospital room?: A Lot Help needed climbing 3-5 steps with a railing? : Total 6 Click Score: 11    End of Session   Activity Tolerance: Patient tolerated treatment well;Patient limited by fatigue Patient left: in bed;with call bell/phone within reach Nurse Communication: Mobility status PT Visit Diagnosis: Unsteadiness on feet (R26.81);Other abnormalities of gait and mobility (R26.89);Muscle weakness (generalized) (M62.81)     Time: 8970-8943 PT Time Calculation (min) (ACUTE ONLY): 27 min  Charges:    $Gait Training: 8-22 mins $Therapeutic Exercise: 8-22 mins PT General Charges $$ ACUTE PT VISIT: 1 Visit                     3:35 PM, 04/05/24 Lynwood Music, MPT Physical Therapist with [facility] [phone] office [phone] mobile phone

## 2024-04-06 ENCOUNTER — Encounter (HOSPITAL_COMMUNITY): Payer: Self-pay | Admitting: Internal Medicine

## 2024-04-06 DIAGNOSIS — E871 Hypo-osmolality and hyponatremia: Secondary | ICD-10-CM | POA: Diagnosis not present

## 2024-04-06 DIAGNOSIS — E876 Hypokalemia: Secondary | ICD-10-CM

## 2024-04-06 DIAGNOSIS — Z515 Encounter for palliative care: Secondary | ICD-10-CM | POA: Diagnosis not present

## 2024-04-06 DIAGNOSIS — Z7189 Other specified counseling: Secondary | ICD-10-CM | POA: Diagnosis not present

## 2024-04-06 LAB — BASIC METABOLIC PANEL WITH GFR
Anion gap: 9 (ref 5–15)
Anion gap: 9 (ref 5–15)
BUN: 11 mg/dL (ref 8–23)
BUN: 11 mg/dL (ref 8–23)
CO2: 21 mmol/L — ABNORMAL LOW (ref 22–32)
CO2: 21 mmol/L — ABNORMAL LOW (ref 22–32)
Calcium: 7.7 mg/dL — ABNORMAL LOW (ref 8.9–10.3)
Calcium: 7.8 mg/dL — ABNORMAL LOW (ref 8.9–10.3)
Chloride: 83 mmol/L — ABNORMAL LOW (ref 98–111)
Chloride: 83 mmol/L — ABNORMAL LOW (ref 98–111)
Creatinine, Ser: 0.68 mg/dL (ref 0.44–1.00)
Creatinine, Ser: 0.65 mg/dL (ref 0.44–1.00)
GFR, Estimated: >60 mL/min
GFR, Estimated: >60 mL/min
Glucose, Bld: 90 mg/dL (ref 70–99)
Glucose, Bld: 85 mg/dL (ref 70–99)
Potassium: 3.8 mmol/L (ref 3.5–5.1)
Potassium: 4 mmol/L (ref 3.5–5.1)
Sodium: 113 mmol/L — CL (ref 135–145)
Sodium: 114 mmol/L — CL (ref 135–145)

## 2024-04-06 LAB — SODIUM
Sodium: 113 mmol/L — CL (ref 135–145)
Sodium: 114 mmol/L — CL (ref 135–145)
Sodium: 114 mmol/L — CL (ref 135–145)

## 2024-04-06 MED ORDER — IPRATROPIUM-ALBUTEROL 0.5-2.5 (3) MG/3ML IN SOLN: 3.0000 mL | Freq: Four times a day (QID) | RESPIRATORY_TRACT | Status: DC | PRN

## 2024-04-06 NOTE — Progress Notes (Signed)
 " PROGRESS NOTE    Marilyn Haas  Marilyn Haas  FMW:[id number] DOB: Jan 01, 1941 DOA: 04/03/2024 PCP: Orpha Yancey LABOR, MD   Brief Narrative:    Marilyn Haas  Marilyn Haas is a 84 y.o. 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  She lives at home with her son who travels often.  He has paid helpers to feed and shower her, however the patient has not been eating her meals or cooperating with the helpers regarding taking showers.  The patient's son came home today and noticed urine and feces different places in the house.  The patient was confused.  Reportedly, she may have had a fall.  He called EMS.  No recent use of alcohol per the patient's son at bedside.   In the ER, alert and confused.  No diagnosis of dementia per the son at bedside.  Noncontrast head CT showed no acute intracranial abnormality.  It showed chronic microvascular ischemic change and mild diffuse cerebral volume loss.  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  Air-fluid level in the right sphenoid sinus which can be seen with acute sinusitis.  Serum sodium 113 from baseline of 127.  UA was negative for pyuria. Vital signs stable in the ER  Patient admitted for hyponatremia. Sodium initially improved to 120 from 113, rather quickly than expected.  Received some dextrose , sodium level again trended down to 113-115.    Assessment & Plan:   Principal Problem:   Hyponatremia Active Problems:   Hypokalemia   Malignant neoplasm of upper-outer quadrant of right breast in female, estrogen receptor positive (HCC)   Frequent falls   Anxiety and depression   Worsening chronic hyponatremia Baseline serum sodium  127 Presented with serum sodium level of 113--> improved rapidly to 120 after normal saline bolus--> received dextrose  briefly and sodium down to 115. Remains between 113 to 115 with fluid restriction and salt tablets. Urine osmolality 160, urine sodium less than 30.?Beer potomania Will restrict fluids Sodium chloride  tabs 1 g 3 times daily Every 4 hours sodium checks.  history of breast cancer on tamoxifen  Tamoxifen  currently on hold.    Acute metabolic encephalopathy secondary to the above Noncontrast head CT was nonacute, no evidence of intracranial bleed Reorient as needed Continue to treat underlying conditions Fall precautions.   Mild non-anion gap acidosis. Improving.  Creatinine is stable at 0.8   elevated liver chemistries AST 58, ALT 57.  Overall remains stable.   Breast cancer on tamoxifen  Resume home regimen Follow-up with oncology outpatient.   History of stroke seen on MRI brain 12/07/2022 Currently not on antiplatelets or statin Monitor on telemetry.   Generalized weakness with possible fall PT OT evaluation Fall precaution. Referred for SNF, TOC  Chronic back pain: Will order for Norco as needed.  Son said Tylenol  didn't help.  Palliative care consulted to discuss goals of care.  Schedule in person in the bedside.      DVT prophylaxis: Subcu Lovenox  daily.   Code Status: Full code, per her son at bedside.   Family Communication: Updated the patient's son at bedside   Disposition Plan: Admitted to stepdown unit.   Consults called: None.   Admission status: Inpatient status.  Status is: Inpatient The patient requires at least 2 midnights for further evaluation and treatment of present condition.   Subjective:  Patient seen and examined at the bedside earlier today.  Son at the bedside.  Was sleeping but arousable.  No acute concerns.  Sodium persistently low. Objective: Vitals:   04/06/24 1114 04/06/24 1200 04/06/24 1300 04/06/24 1500   BP:  (!) 154/77 (!) 95/56 132/73  Pulse:  76 80 76  Resp:  15 13   Temp: 98.5 F (36.9 C)     TempSrc: Oral     SpO2:  99% 100% 97%  Weight:      Height:        Intake/Output Summary (Last 24 hours) at 04/06/2024 1554 Last data filed at 04/06/2024 0956 Gross per 24 hour  Intake 1248.72 ml  Output 450 ml  Net 798.72 ml   Filed Weights   04/03/24 1839 04/03/24 2248  Weight: 59.8 kg 55.4 kg    Examination:  General exam: Alert, calm and comfortable, oriented x 2 Respiratory system: Bilateral decreased breath sounds at bases Cardiovascular system: S1 & S2 heard, Rate controlled Gastrointestinal system: Abdomen is nondistended, soft and nontender. Normal bowel sounds heard. Extremities: No cyanosis, clubbing, edema  Central nervous system: Alert and orientedx2. No focal neurological deficits. Moving extremities    Data Reviewed: I have personally reviewed following labs and imaging studies  CBC: Recent Labs  Lab 04/03/24 1853 04/05/24 0302  WBC 8.4 6.5  NEUTROABS 6.5 5.0  HGB 14.4 9.6*  HCT 40.2 27.3*  MCV 88.5 89.8  PLT 295 194   Basic Metabolic Panel: Recent Labs  Lab 04/05/24 1434 04/05/24 1739 04/05/24 2150 04/06/24 0203 04/06/24 0533 04/06/24 1206  NA 113* 113* 113* 113* 114* 113*  K 3.7 3.5 3.7 3.8 4.0  --   CL 85* 85* 86* 83* 83*  --   CO2 17* 17* 17* 21* 21*  --   GLUCOSE 92 133* 113* 90 85  --   BUN 12 12 11 11 11   --   CREATININE 0.66 0.71 0.62 0.68 0.65  --   CALCIUM 7.3* 7.4* 7.4* 7.7* 7.8*  --    GFR: Estimated Creatinine Clearance: 45 mL/min (by C-G formula based on SCr of 0.65 mg/dL). Liver Function Tests: Recent Labs  Lab 04/03/24 1853  AST 58*  ALT 57*  ALKPHOS 121  BILITOT 0.7  PROT 7.7  ALBUMIN 4.1   No results for input(s): LIPASE, AMYLASE in the last 168 hours. No results for input(s): AMMONIA in the last 168 hours. Coagulation Profile: No results for input(s): INR, PROTIME in the last 168 hours. Cardiac  Enzymes: No results for input(s): CKTOTAL, CKMB, CKMBINDEX, TROPONINI in the last 168 hours. BNP (last 3 results) No results for input(s): PROBNP in the last 8760 hours. HbA1C: No results for input(s): HGBA1C in the last 72 hours. CBG: No results for input(s): GLUCAP in the last 168 hours. Lipid Profile: No results for input(s): CHOL, HDL, LDLCALC, TRIG, CHOLHDL, LDLDIRECT in the last 72 hours. Thyroid  Function Tests: No results for input(s): TSH, T4TOTAL, FREET4, T3FREE, THYROIDAB in the last 72 hours. Anemia Panel: No results for input(s): VITAMINB12, FOLATE, FERRITIN, TIBC, IRON, RETICCTPCT in the last 72 hours. Sepsis Labs: Recent Labs  Lab 04/03/24 1841  LATICACIDVEN 1.5    Recent Results (from the past 240 hours)  Culture, blood (routine x 2)     Status: None (Preliminary result)   Collection Time: 04/03/24  6:41 PM   Specimen:  BLOOD  Result Value Ref Range Status   Specimen Description BLOOD  Final   Special Requests NONE  Final   Culture   Final    NO GROWTH 3 DAYS Performed at [facility], [address], [place], [place] [address]    Report Status PENDING  Incomplete  Culture, blood (routine x 2)     Status: None (Preliminary result)   Collection Time: 04/03/24  6:46 PM   Specimen: BLOOD  Result Value Ref Range Status   Specimen Description BLOOD  Final   Special Requests NONE  Final   Culture   Final    NO GROWTH 3 DAYS Performed at [facility], [address], [place], [place] [address]    Report Status PENDING  Incomplete  MRSA Next Gen by PCR, Nasal     Status: None   Collection Time: 04/03/24 10:06 PM   Specimen: Nasal Mucosa; Nasal Swab  Result Value Ref Range Status   MRSA by PCR Next Gen NOT DETECTED NOT DETECTED Final    Comment: (NOTE) The GeneXpert MRSA Assay (FDA approved for NASAL specimens only), is one component of a comprehensive MRSA colonization surveillance program. It is not intended to  diagnose MRSA infection nor to guide or monitor treatment for MRSA infections. Test performance is not FDA approved in patients less than 26 years old. Performed at [facility], [address] [place], [place] [address]          Radiology Studies: No results found.       Scheduled Meds:  ARIPiprazole   5 mg Oral Daily   Chlorhexidine  Gluconate Cloth  6 each Topical Daily   citalopram   20 mg Oral Daily   enoxaparin  (LOVENOX ) injection  40 mg Subcutaneous Q24H   pantoprazole   40 mg Oral BID   sodium chloride   1 g Oral TID WC   Continuous Infusions:          Perrin Gens, MD [facility] 04/06/2024, 3:54 PM   "

## 2024-04-06 NOTE — Progress Notes (Signed)
 Occupational Therapy Treatment Patient Details Name: Marilyn Haas MRN: [id number] DOB: Jul 14, 1940 Today's Date: 04/06/2024   History of present illness Marilyn  H Haas is a 84 y.o. 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  She lives at home with her son who travels often.  He has paid helpers to feed and shower her, however the patient has not been eating her meals or cooperating with the helpers regarding taking showers.  The patient's son came home today and noticed urine and feces different places in the house.  The patient was confused.  Reportedly, she may have had a fall.  He called EMS.  No recent use of alcohol per the patient's son at bedside. (per DO)   OT comments  Pt agreeable to OT treatment. Pt hard of hearing needing repetition of commands. Pt required mod A for bed mobilty and EOB to chair transfer. Once in the chair pt attempted lower body dressing with difficulty. Pt also tolerated shoulder protraction. Pt seemed a bit lethargic with eyes shutting at times towards the end of session. Pt left in the chair with chair alarm set and call bell within reach. Pt will benefit from continued OT in the hospital to increase strength, balance, and endurance for safe ADL's.         If plan is discharge home, recommend the following:  A lot of help with walking and/or transfers;A lot of help with bathing/dressing/bathroom;Assistance with cooking/housework;Direct supervision/assist for medications management;Assist for transportation;Help with stairs or ramp for entrance   Equipment Recommendations  None recommended by OT          Precautions / Restrictions  Precautions Precautions: Fall Recall of Precautions/Restrictions: Impaired Restrictions Weight Bearing Restrictions Per Provider Order: No       Mobility Bed Mobility Overal bed mobility: Needs Assistance Bed Mobility: Supine to Sit     Supine to sit: Mod assist, HOB elevated     General bed mobility comments: Labored effort; increased time.    Transfers Overall transfer level: Needs assistance Equipment used: Rolling walker (2 wheels) Transfers: Sit to/from Stand, Bed to chair/wheelchair/BSC Sit to Stand: Mod assist     Step pivot transfers: Mod assist     General transfer comment: EOB to chair with RW; slight posterior lean when frist standing.     Balance Overall balance assessment: Needs assistance Sitting-balance support: Feet supported, Bilateral upper extremity supported Sitting balance-Leahy Scale: Fair Sitting balance - Comments: seated at EOB   Standing balance support: Reliant on assistive device for balance, During functional activity, Bilateral upper extremity supported Standing balance-Leahy Scale: Poor Standing balance comment: using RW                           ADL either performed or assessed with clinical judgement   ADL Overall ADL's : Needs assistance/impaired                       Lower Body Dressing Details (indicate cue type and reason): Attempted but pt only able to partially remove a sock from her heel while seated in the chair.  Communication Communication Communication: Impaired Factors Affecting Communication: Hearing impaired   Cognition Arousal: Alert Behavior During Therapy: WFL for tasks assessed/performed Cognition: History of cognitive impairments, Cognition impaired                               Following commands: Intact        Cueing   Cueing Techniques: Verbal cues, Tactile cues, Gestural cues  Exercises Exercises: Other exercises Other Exercises Other  Exercises: x10 protraction seated in the chair with tactile cuing.                 Pertinent Vitals/ Pain       Pain Assessment Pain Assessment: No/denies pain                                                          Frequency  Min 2X/week        Progress Toward Goals  OT Goals(current goals can now be found in the care plan section)  Progress towards OT goals: Progressing toward goals  Acute Rehab OT Goals Patient Stated Goal: Improve function. OT Goal Formulation: With patient/family Time For Goal Achievement: 04/18/24 Potential to Achieve Goals: Fair ADL Goals Pt Will Perform Grooming: with modified independence;standing Pt Will Perform Lower Body Bathing: with modified independence;sitting/lateral leans Pt Will Perform Upper Body Dressing: with modified independence Pt Will Perform Lower Body Dressing: with modified independence;sitting/lateral leans Pt Will Transfer to Toilet: with modified independence;ambulating;with supervision Pt Will Perform Toileting - Clothing Manipulation and hygiene: with modified independence;sitting/lateral leans;with supervision Pt/caregiver will Perform Home Exercise Program: Increased strength;Both right and left upper extremity;Independently  Plan                                      End of Session Equipment Utilized During Treatment: Rolling walker (2 wheels);Gait belt  OT Visit Diagnosis: Unsteadiness on feet (R26.81);Other abnormalities of gait and mobility (R26.89);Muscle weakness (generalized) (M62.81);History of falling (Z91.81);Other symptoms and signs involving cognitive function   Activity Tolerance Patient tolerated treatment well   Patient Left in chair;with call bell/phone within reach;with chair alarm set   Nurse Communication Other (comment) (Notified pt was in the chair.)        Time: 1425-1443 OT Time Calculation (min): 18 min  Charges: OT General Charges $OT Visit:  1 Visit OT Treatments $Self Care/Home Management : 8-22 mins  [facility], [place] OT, MOT  Jayson Person 04/06/2024, 3:47 PM

## 2024-04-06 NOTE — Progress Notes (Signed)
 Date and time results received: 04/06/24 0322 (usesmartphrase .now to insert current time)  Test: Sodium Critical Value: 113  Name of Provider Notified: Lynwood Kipper, NP  Orders Received? Or Actions Taken?: No new orders

## 2024-04-06 NOTE — Consult Note (Signed)
 "                                                                                   Consultation Note Date: 04/06/2024   Patient Name: Marilyn Haas  DOB: 28-Sep-1940  MRN: [id number]  Age / Sex: 84 y.o., female  PCP: Orpha Yancey LABOR, MD Referring Physician: Mcarthur Pick, MD  Reason for Consultation: Establishing goals of care  HPI/Patient Profile: 84 y.o. 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   Clinical Assessment and Goals of Care: Face-to-face discussion with transition of care team and bedside nursing staff related to patient condition, needs, goals of care, disposition.  I have reviewed medical records including EPIC notes, labs and imaging, received report from RN, assessed the patient.  [name] is lying quietly in bed.  She appears acutely/chronically ill and very frail, very pale.  She is resting comfortably, but wakes when I touch her arm.  She is read to me, smiling and mostly keeping eye contact.  She has memory loss at baseline but is able to tell me her name.  I do believe that she can make her basic needs known.  There is no family at bedside at this time.  [name] is able to take a cup of water  from my hand, hold that, and drink from it independently without overt signs and symptoms of aspiration.  Call to son, Holy Battenfield, to discuss diagnosis prognosis, GOC, EOL wishes, disposition and options. I introduced Palliative Medicine as specialized medical care for people living with serious illness. It focuses on providing relief from the symptoms and stress of a serious illness. The goal is to improve quality of life for both the patient and the family.  We discussed a brief life review of the patient.  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  She has 3 children, sons [name], and Deering. Her husband was a salesperson who frequently worked out of town.  She also helped her husband with business occasionally.  She has been a widow since 2018.  She has been living independently with some caregivers, but is refusing bathing services recently.  Son Kemp is in and out of the house.   We then focused on their current illness.  We talked about low sodium and the treatment plan.  We discussed normal values.  We also talked about Mrs. Senne's hearing loss, isolation, confusion and likely memory loss.  We talked about a few what if's and maybe's.  We talked about disposition options if [name] improves, such as short-term rehab with outpatient palliative services.  We also talk about comfort care and hospice if she does not show meaningful improvements.  I share that just like I will get sick again, so we will [name].  We talked about what this time looks like and feels like for her, what we can and cannot change.  The natural disease trajectory and expectations at EOL were discussed.  I attempted to elicit  values and goals of care important to the patient.  The difference between aggressive medical intervention and comfort care was considered in light of the patient's goals of care.  Sherida shares that his mother was always very particular about how she dressed and presented herself.  He states that she would be very upset if she understood her current condition  Advanced directives, concepts specific to code status, artifical feeding and hydration, and rehospitalization were considered and discussed.  We discussed the concept of treat the treatable, but allowing natural passing.  I encouraged Sherida to consider what this time looks like and feels like for [name].  We talked about the concept of let nature take its course, and I given example of when this would be appropriate.SABRA   Hospice and Palliative Care services  outpatient were explained and offered.  We talked about the benefit of outpatient palliative services.  We also talked about hospice services when appropriate.  We talked about comfort care and residential hospice.  Discussed the importance of continued conversation with family and the medical providers regarding overall plan of care and treatment options, ensuring decisions are within the context of the patients values and GOCs.  Questions and concerns were addressed.   The family was encouraged to call with questions or concerns.  PMT will continue to support holistically.  Conference with attending, bedside nursing staff, transition of care team related to patient condition, needs, goals of care, disposition.   HCPOA NEXT OF KIN -Sherida shares that he is unsure if his mother has healthcare power of attorney.  He states he and his brothers make choices as a team.    SUMMARY OF RECOMMENDATIONS   At this point continue to treat the treatable, time for outcomes Sons are considering CODE STATUS If improved, anticipate short-term rehab with outpatient palliative If no improvement, considering comfort care   Code Status/Advance Care Planning: Full code - We discussed the concept of treat the treatable, but allowing natural passing.  I encouraged Sherida to consider what this time looks like and feels like for [name].  We talked about the concept of let nature take its course, and I given example of when this would be appropriate.  Symptom Management:  Per hospitalist, no additional needs at this time.  Palliative Prophylaxis:  Frequent Pain Assessment, Oral Care, and Turn Reposition  Additional Recommendations (Limitations, Scope, Preferences): Full Scope Treatment  Psycho-social/Spiritual:  Desire for further Chaplaincy support:no Additional Recommendations: Caregiving  Support/Resources  Prognosis:  Unable to determine, based on outcomes.  Guarded at this point.  Discharge  Planning: To Be Determined      Primary Diagnoses: Present on Admission:  Hyponatremia  Hypokalemia  Anxiety and depression   I have reviewed the medical record, interviewed the patient and family, and examined the patient. The following aspects are pertinent.  Past Medical History:  Diagnosis Date   Anemia    Anxiety    Arthritis    Breast cancer (HCC) 05/19/2016   Right breast   Chronic back pain    Scoliosis, stenosis   Depression    Diverticulosis    GERD (gastroesophageal reflux disease)    Hard of hearing    History of blood transfusion    History of colon polyps    History of hiatal hernia    History of pneumonia    Hypertension    Insomnia    Osteopenia    Osteoporosis    Personal history of radiation therapy  Thickened endometrium 09/29/2017   Social History   Socioeconomic History   Marital status: Widowed    Spouse name: Not on file   Number of children: 4   Years of education: 80   Highest education level: 12th grade  Occupational History    Employer: RETIRED  Tobacco Use   Smoking status: Former    Current packs/day: 0.00    Average packs/day: 0.5 packs/day for 2.0 years (1.0 ttl pk-yrs)    Types: Cigarettes    Start date: 01/16/1959    Quit date: 01/15/1961    Years since quitting: 63.2    Passive exposure: Past   Smokeless tobacco: Never   Tobacco comments:    Verified by Gilmer Josue Areola  Vaping Use   Vaping status: Never Used  Substance and Sexual Activity   Alcohol use: Not Currently    Alcohol/week: 15.0 standard drinks of alcohol    Types: 15 Cans of beer per week    Comment: 4 light beers daily   Drug use: No   Sexual activity: Not Currently    Partners: Male    Birth control/protection: Post-menopausal  Other Topics Concern   Not on file  Social History Narrative   Not on file   Social Drivers of Health   Tobacco Use: Medium Risk (04/06/2024)   Patient History    Smoking Tobacco Use: Former    Smokeless Tobacco  Use: Never    Passive Exposure: Past  Physicist, Medical Strain: Low Risk (10/26/2022)   Overall Financial Resource Strain (CARDIA)    Difficulty of Paying Living Expenses: Not hard at all  Food Insecurity: Patient Unable To Answer (04/04/2024)   Epic    Worried About Programme Researcher, Broadcasting/film/video in the Last Year: Patient unable to answer    Ran Out of Food in the Last Year: Patient unable to answer  Transportation Needs: Patient Unable To Answer (04/04/2024)   Epic    Lack of Transportation (Medical): Patient unable to answer    Lack of Transportation (Non-Medical): Patient unable to answer  Physical Activity: Inactive (03/23/2022)   Exercise Vital Sign    Days of Exercise per Week: 0 days    Minutes of Exercise per Session: 0 min  Stress: No Stress Concern Present (03/23/2022)   [employer] of Occupational Health - Occupational Stress Questionnaire    Feeling of Stress : Only a little  Social Connections: Patient Unable To Answer (04/04/2024)   Social Connection and Isolation Panel    Frequency of Communication with Friends and Family: Patient unable to answer    Frequency of Social Gatherings with Friends and Family: Patient unable to answer    Attends Religious Services: Patient unable to answer    Active Member of Clubs or Organizations: Patient unable to answer    Attends Banker Meetings: Patient unable to answer    Marital Status: Patient unable to answer  Depression (PHQ2-9): Medium Risk (03/23/2022)   Depression (PHQ2-9)    PHQ-2 Score: 9  Alcohol Screen: Low Risk (03/23/2022)   Alcohol Screen    Last Alcohol Screening Score (AUDIT): 2  Housing: Patient Unable To Answer (04/04/2024)   Epic    Unable to Pay for Housing in the Last Year: Patient unable to answer    Number of Times Moved in the Last Year: Not on file    Homeless in the Last Year: Patient unable to answer  Utilities: Patient Unable To Answer (04/04/2024)   Epic    Threatened with  loss of utilities: Patient  unable to answer  Health Literacy: Not on file   Family History  Problem Relation Age of Onset   Heart failure Mother    Diabetes Mother    Hypertension Mother    Depression Mother    Other Father        stomach ulcers; abd aneursym   Hypertension Sister    Heart failure Sister    Diabetes Sister    Depression Sister    Diabetes Sister        prediabetes   Cancer Maternal Aunt        breast   Scheduled Meds:  ARIPiprazole   5 mg Oral Daily   Chlorhexidine  Gluconate Cloth  6 each Topical Daily   citalopram   20 mg Oral Daily   enoxaparin  (LOVENOX ) injection  40 mg Subcutaneous Q24H   pantoprazole   40 mg Oral BID   sodium chloride   1 g Oral TID WC   Continuous Infusions: PRN Meds:.acetaminophen , HYDROcodone -acetaminophen , melatonin, mouth rinse, polyethylene glycol, prochlorperazine  Medications Prior to Admission:  Prior to Admission medications  Medication Sig Start Date End Date Taking? Authorizing Provider  acetaminophen  (TYLENOL ) 325 MG tablet Take 650 mg by mouth every 6 (six) hours as needed.   Yes [provider]  ARIPiprazole  (ABILIFY ) 5 MG tablet Take 5 mg by mouth daily. 03/30/24  Yes [provider]  citalopram  (CELEXA ) 20 MG tablet Take 20 mg by mouth daily. 03/30/24  Yes [provider]  HYDROcodone -acetaminophen  (NORCO) 7.5-325 MG tablet Take 1 tablet by mouth every 8 (eight) hours as needed. 03/30/24  Yes [provider]  losartan  (COZAAR ) 25 MG tablet TAKE ONE TABLET BY MOUTH TWICE A DAY 04/09/23  Yes Debera Jayson MATSU, MD  pantoprazole  (PROTONIX ) 40 MG tablet TAKE ONE TABLET (40MG  TOTAL) BY MOUTH TWO TIMES DAILY 07/01/23  Yes Mahon, Charmaine CROME, NP  tamoxifen  (NOLVADEX ) 10 MG tablet TAKE ONE TABLET (10MG  TOTAL) BY MOUTH EVERY OTHER DAY 08/27/23  Yes Iruku, Praveena, MD  venlafaxine  (EFFEXOR ) 37.5 MG tablet Take 37.5 mg by mouth 2 (two) times daily. 02/24/24  Yes [provider]   Allergies[1] Review of Systems  Unable to  perform ROS: Acuity of condition    Physical Exam Vitals and nursing note reviewed.  Constitutional:      General: She is not in acute distress.    Appearance: She is ill-appearing.  Cardiovascular:     Rate and Rhythm: Normal rate.  Skin:    General: Skin is warm and dry.     Coloration: Skin is pale.  Neurological:     Mental Status: She is alert.     Comments: Oriented to self only at baseline  Psychiatric:        Mood and Affect: Mood normal.        Behavior: Behavior normal.     Vital Signs: BP 131/76   Pulse 75   Temp 97.9 F (36.6 C) (Oral)   Resp (!) 9   Ht 5' 5 (1.651 m)   Wt 55.4 kg   SpO2 97%   BMI 20.32 kg/m  Pain Scale: 0-10   Pain Score: 0-No pain   SpO2: SpO2: 97 % O2 Device:SpO2: 97 % O2 Flow Rate: .   IO: Intake/output summary:  Intake/Output Summary (Last 24 hours) at 04/06/2024 0948 Last data filed at 04/06/2024 0400 Gross per 24 hour  Intake 1168.72 ml  Output 450 ml  Net 718.72 ml    LBM: Last BM Date :  (  PTA) Baseline Weight: Weight: 59.8 kg Most recent weight: Weight: 55.4 kg     Palliative Assessment/Data:     Time In: 0930 Time Out: 1045 Time Total: 75 minutes  Greater than 50%  of this time was spent counseling and coordinating care related to the above assessment and plan.  Signed by: Lorenza DELENA Birkenhead, NP   Please contact Palliative Medicine Team phone at [phone] for questions and concerns.  For individual provider: See Amion     [1]  Allergies Allergen Reactions   Avelox [Moxifloxacin Hcl In Nacl] Other (See Comments)    Altered mental status   "

## 2024-04-06 NOTE — Plan of Care (Signed)
" °  Problem: Education: Goal: Knowledge of General Education information will improve Description: Including pain rating scale, medication(s)/side effects and non-pharmacologic comfort measures Outcome: Not Progressing   Problem: Health Behavior/Discharge Planning: Goal: Ability to manage health-related needs will improve Outcome: Progressing   Problem: Clinical Measurements: Goal: Ability to maintain clinical measurements within normal limits will improve Outcome: Progressing Goal: Will remain free from infection Outcome: Progressing Goal: Diagnostic test results will improve Outcome: Progressing Goal: Respiratory complications will improve Outcome: Progressing Goal: Cardiovascular complication will be avoided Outcome: Progressing   Problem: Activity: Goal: Risk for activity intolerance will decrease Outcome: Progressing   Problem: Nutrition: Goal: Adequate nutrition will be maintained Outcome: Not Progressing   Problem: Coping: Goal: Level of anxiety will decrease Outcome: Progressing   Problem: Elimination: Goal: Will not experience complications related to bowel motility Outcome: Progressing Goal: Will not experience complications related to urinary retention Outcome: Progressing   Problem: Pain Managment: Goal: General experience of comfort will improve and/or be controlled Outcome: Progressing   Problem: Safety: Goal: Ability to remain free from injury will improve Outcome: Progressing   Problem: Skin Integrity: Goal: Risk for impaired skin integrity will decrease Outcome: Progressing   "

## 2024-04-07 DIAGNOSIS — E871 Hypo-osmolality and hyponatremia: Secondary | ICD-10-CM | POA: Diagnosis not present

## 2024-04-07 LAB — BASIC METABOLIC PANEL WITH GFR
Anion gap: 11 (ref 5–15)
BUN: 16 mg/dL (ref 8–23)
CO2: 18 mmol/L — ABNORMAL LOW (ref 22–32)
Calcium: 7.8 mg/dL — ABNORMAL LOW (ref 8.9–10.3)
Chloride: 87 mmol/L — ABNORMAL LOW (ref 98–111)
Creatinine, Ser: 0.62 mg/dL (ref 0.44–1.00)
GFR, Estimated: >60 mL/min
Glucose, Bld: 80 mg/dL (ref 70–99)
Potassium: 4.1 mmol/L (ref 3.5–5.1)
Sodium: 116 mmol/L — CL (ref 135–145)

## 2024-04-07 LAB — SODIUM
Sodium: 118 mmol/L — CL (ref 135–145)
Sodium: 118 mmol/L — CL (ref 135–145)
Sodium: 118 mmol/L — CL (ref 135–145)

## 2024-04-07 MED ORDER — ENSURE PLUS HIGH PROTEIN PO LIQD
237.0000 mL | Freq: Two times a day (BID) | ORAL | Status: DC
  Administered 2024-04-07 – 2024-04-13 (×11): 237 mL via ORAL

## 2024-04-07 MED ORDER — SODIUM CHLORIDE 1 G PO TABS
2.0000 g | ORAL_TABLET | Freq: Three times a day (TID) | ORAL | Status: DC
  Administered 2024-04-07 – 2024-04-13 (×20): 2 g via ORAL
  Filled 2024-04-07 (×20): qty 2

## 2024-04-07 NOTE — Plan of Care (Signed)

## 2024-04-07 NOTE — Progress Notes (Addendum)
 " PROGRESS NOTE    Marilyn  DERRA Haas  FMW:[id number] DOB: 11-11-1940 DOA: 04/03/2024 PCP: Orpha Yancey LABOR, MD   Brief Narrative:    Marilyn  HANG Haas is a 84 y.o. 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  She lives at home with her son who travels often.  He has paid helpers to feed and shower her, however the patient has not been eating her meals or cooperating with the helpers regarding taking showers.  The patient's son came home today and noticed urine and feces different places in the house.  The patient was confused.  Reportedly, she may have had a fall.  He called EMS.  No recent use of alcohol per the patient's son at bedside.   In the ER, alert and confused.  No diagnosis of dementia per the son at bedside.  Noncontrast head CT showed no acute intracranial abnormality.  It showed chronic microvascular ischemic change and mild diffuse cerebral volume loss.  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  Air-fluid level in the right sphenoid sinus which can be seen with acute sinusitis.  Serum sodium 113 from baseline of 127.  UA was negative for pyuria. Vital signs stable in the ER  Patient admitted for hyponatremia. Sodium initially improved to 120 from 113, rather quickly than expected.  Received some dextrose , sodium level again trended down to 113-115.    Assessment & Plan:   Principal Problem:   Hyponatremia Active Problems:   Hypokalemia   Malignant neoplasm of upper-outer quadrant of right breast in female, estrogen receptor positive (HCC)   Frequent falls   Anxiety and depression   Worsening chronic hyponatremia Baseline serum sodium  127 Presented with serum sodium level of 113--> improved rapidly to 120 after normal saline bolus--> received dextrose  briefly and sodium down to 115.  This has started to improve with salt tablets.   Urine osmolality 160, urine sodium less than 30.?Beer potomania Will restrict fluids Will increase salt tablets to 2 g 3 times daily  every 4 hours sodium checks-> last sodium reading 118  history of breast cancer on tamoxifen  Tamoxifen  currently on hold.    Acute metabolic encephalopathy secondary to the above Noncontrast head CT was nonacute, no evidence of intracranial bleed Reorient as needed Continue to treat underlying conditions Fall precautions.   Mild non-anion gap acidosis. Improving.  Creatinine is stable at 0.8   elevated liver chemistries AST 58, ALT 57.  Overall remains stable.   Breast cancer on tamoxifen  Resume home regimen Follow-up with oncology outpatient.   History of stroke seen on MRI brain 12/07/2022 Currently not on antiplatelets or statin Monitor on telemetry.   Generalized weakness with possible fall PT OT evaluation Fall precaution. Referred for SNF, TOC  Chronic back pain: Will order for Norco as needed.   Palliative care consulted to discuss goals of care.  Continue current management.  If improvement plan for SNF, if declining son could consider hospice/comfort care.   DVT prophylaxis: Subcu Lovenox  daily.   Code Status: Full code, per her son at bedside.   Family Communication: Updated the patient's son at bedside 1/22   Disposition Plan: Admitted to stepdown unit.   Consults called: None.  Admission status: Inpatient status.     Status is: Inpatient The patient requires at least 2 midnights for further evaluation and treatment of present condition.   Subjective:  Patient seen and examined at the bedside earlier today.  No family at the bedside.  Patient is alert, conversant, some confusion no acute issues overnight.  Sodium is  still low but is an improving trend.  Objective: Vitals:   04/07/24 0700 04/07/24 0800 04/07/24 0900 04/07/24 1250  BP: 136/69 125/66 (!) 109/48   Pulse: 89 93    Resp: 14 18 17    Temp:    98.8 F (37.1 C)  TempSrc:    Oral  SpO2: 100% 99%    Weight:      Height:        Intake/Output Summary (Last 24 hours) at 04/07/2024 1442 Last data filed at 04/07/2024 0400 Gross per 24 hour  Intake --  Output 475 ml  Net -475 ml   Filed Weights   04/03/24 1839 04/03/24 2248  Weight: 59.8 kg 55.4 kg    Examination:  General exam: Alert, calm and comfortable, oriented x 2 Respiratory system: Bilateral decreased breath sounds at bases Cardiovascular system: S1 & S2 heard, Rate controlled Gastrointestinal system: Abdomen is nondistended, soft and nontender. Normal bowel sounds heard. Extremities: No cyanosis, clubbing, edema  Central nervous system: Alert and orientedx2. No focal neurological deficits. Moving extremities    Data Reviewed: I have personally reviewed following labs and imaging studies  CBC: Recent Labs  Lab 04/03/24 1853 04/05/24 0302  WBC 8.4 6.5  NEUTROABS 6.5 5.0  HGB 14.4 9.6*  HCT 40.2 27.3*  MCV 88.5 89.8  PLT 295 194   Basic Metabolic Panel: Recent Labs  Lab 04/05/24 1434 04/05/24 1739 04/05/24 2150 04/06/24 0203 04/06/24 0533 04/06/24 1206 04/06/24 1556 04/06/24 2043 04/07/24 1114  NA 113* 113* 113* 113* 114* 113* 114* 114* 118*  K 3.7 3.5 3.7 3.8 4.0  --   --   --   --   CL 85* 85* 86* 83* 83*  --   --   --   --   CO2 17* 17* 17* 21* 21*  --   --   --   --   GLUCOSE 92 133* 113* 90 85  --   --   --   --   BUN 12 12 11 11 11   --   --   --   --   CREATININE 0.66 0.71 0.62 0.68 0.65  --   --   --   --   CALCIUM 7.3* 7.4* 7.4* 7.7* 7.8*  --   --   --   --    GFR: Estimated Creatinine Clearance: 45 mL/min (by C-G formula based on SCr of 0.65 mg/dL). Liver Function Tests: Recent Labs  Lab 04/03/24 1853  AST 58*  ALT 57*  ALKPHOS 121   BILITOT 0.7  PROT 7.7  ALBUMIN 4.1   No results for input(s): LIPASE, AMYLASE in the last 168 hours. No results for input(s): AMMONIA in the last 168 hours. Coagulation Profile: No results for input(s): INR, PROTIME in the last 168 hours. Cardiac Enzymes: No results for input(s): CKTOTAL, CKMB, CKMBINDEX, TROPONINI in the last 168 hours. BNP (last 3 results) No results for input(s): PROBNP in the last 8760 hours. HbA1C: No results for input(s): HGBA1C in the last 72 hours. CBG: No results for input(s): GLUCAP in the last 168 hours. Lipid Profile: No results for input(s): CHOL, HDL, LDLCALC,  TRIG, CHOLHDL, LDLDIRECT in the last 72 hours. Thyroid  Function Tests: No results for input(s): TSH, T4TOTAL, FREET4, T3FREE, THYROIDAB in the last 72 hours. Anemia Panel: No results for input(s): VITAMINB12, FOLATE, FERRITIN, TIBC, IRON, RETICCTPCT in the last 72 hours. Sepsis Labs: Recent Labs  Lab 04/03/24 1841  LATICACIDVEN 1.5    Recent Results (from the past 240 hours)  Culture, blood (routine x 2)     Status: None (Preliminary result)   Collection Time: 04/03/24  6:41 PM   Specimen: BLOOD  Result Value Ref Range Status   Specimen Description BLOOD  Final   Special Requests NONE  Final   Culture   Final    NO GROWTH 4 DAYS Performed at [facility], [address], [place], [place] [address]    Report Status PENDING  Incomplete  Culture, blood (routine x 2)     Status: None (Preliminary result)   Collection Time: 04/03/24  6:46 PM   Specimen: BLOOD  Result Value Ref Range Status   Specimen Description BLOOD  Final   Special Requests NONE  Final   Culture   Final    NO GROWTH 4 DAYS Performed at [facility], [address], [place], [place] [address]    Report Status PENDING  Incomplete  MRSA Next Gen by PCR, Nasal     Status: None   Collection Time: 04/03/24 10:06 PM   Specimen: Nasal Mucosa; Nasal Swab  Result  Value Ref Range Status   MRSA by PCR Next Gen NOT DETECTED NOT DETECTED Final    Comment: (NOTE) The GeneXpert MRSA Assay (FDA approved for NASAL specimens only), is one component of a comprehensive MRSA colonization surveillance program. It is not intended to diagnose MRSA infection nor to guide or monitor treatment for MRSA infections. Test performance is not FDA approved in patients less than 59 years old. Performed at [facility], [address] [place], [place] [address]          Radiology Studies: No results found.       Scheduled Meds:  ARIPiprazole   5 mg Oral Daily   Chlorhexidine  Gluconate Cloth  6 each Topical Daily   citalopram   20 mg Oral Daily   enoxaparin  (LOVENOX ) injection  40 mg Subcutaneous Q24H   feeding supplement  237 mL Oral BID BM   pantoprazole   40 mg Oral BID   sodium chloride   2 g Oral TID WC   Continuous Infusions:          Marilyn Churchwell, MD [facility] 04/07/2024, 2:42 PM   "

## 2024-04-08 DIAGNOSIS — E871 Hypo-osmolality and hyponatremia: Secondary | ICD-10-CM | POA: Diagnosis not present

## 2024-04-08 LAB — CULTURE, BLOOD (ROUTINE X 2)
Culture: NO GROWTH
Culture: NO GROWTH

## 2024-04-08 LAB — BASIC METABOLIC PANEL WITH GFR
Anion gap: 6 (ref 5–15)
Anion gap: 10 (ref 5–15)
BUN: 13 mg/dL (ref 8–23)
BUN: 11 mg/dL (ref 8–23)
CO2: 24 mmol/L (ref 22–32)
CO2: 20 mmol/L — ABNORMAL LOW (ref 22–32)
Calcium: 7.6 mg/dL — ABNORMAL LOW (ref 8.9–10.3)
Calcium: 7.6 mg/dL — ABNORMAL LOW (ref 8.9–10.3)
Chloride: 90 mmol/L — ABNORMAL LOW (ref 98–111)
Chloride: 89 mmol/L — ABNORMAL LOW (ref 98–111)
Creatinine, Ser: 0.82 mg/dL (ref 0.44–1.00)
Creatinine, Ser: 0.67 mg/dL (ref 0.44–1.00)
GFR, Estimated: >60 mL/min
GFR, Estimated: >60 mL/min
Glucose, Bld: 78 mg/dL (ref 70–99)
Glucose, Bld: 103 mg/dL — ABNORMAL HIGH (ref 70–99)
Potassium: 4.2 mmol/L (ref 3.5–5.1)
Potassium: 3.8 mmol/L (ref 3.5–5.1)
Sodium: 120 mmol/L — ABNORMAL LOW (ref 135–145)
Sodium: 119 mmol/L — CL (ref 135–145)

## 2024-04-08 LAB — SODIUM: Sodium: 118 mmol/L — CL (ref 135–145)

## 2024-04-08 MED ORDER — GUAIFENESIN 100 MG/5ML PO LIQD
5.0000 mL | ORAL | Status: DC | PRN
  Administered 2024-04-08: 5 mL via ORAL
  Filled 2024-04-08: qty 5

## 2024-04-08 NOTE — Progress Notes (Signed)
 Sodium level resulted at 119. Dr. Sigdel made aware.

## 2024-04-08 NOTE — Progress Notes (Signed)
 " PROGRESS NOTE    Marilyn  SCOTTLYN Haas  FMW:[id number] DOB: Jul 18, 1940 DOA: 04/03/2024 PCP: Orpha Yancey LABOR, MD   Brief Narrative:    Marilyn  ADDISSON Haas is a 84 y.o. 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  She lives at home with her son who travels often.  He has paid helpers to feed and shower her, however the patient has not been eating her meals or cooperating with the helpers regarding taking showers.  The patient's son came home today and noticed urine and feces different places in the house.  The patient was confused.  Reportedly, she may have had a fall.  He called EMS.  No recent use of alcohol per the patient's son at bedside.   In the ER, alert and confused.  No diagnosis of dementia per the son at bedside.  Noncontrast head CT showed no acute intracranial abnormality.  It showed chronic microvascular ischemic change and mild diffuse cerebral volume loss.  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  Air-fluid level in the right sphenoid sinus which can be seen with acute sinusitis.  Serum sodium 113 from baseline of 127.  UA was negative for pyuria. Vital signs stable in the ER  Patient admitted for hyponatremia. Sodium initially improved to 120 from 113, rather quickly than expected.  Received some dextrose , sodium level again trended down to 113-115.    Assessment & Plan:   Principal Problem:   Hyponatremia Active Problems:   Hypokalemia   Malignant neoplasm of upper-outer quadrant of right breast in female, estrogen receptor positive (HCC)   Frequent falls   Anxiety and depression   Worsening chronic hyponatremia Baseline serum sodium  127 Presented with serum sodium level of 113--> improved rapidly to 120 after normal saline bolus--> received dextrose  briefly and sodium down to 115.  This has started to improve with salt tablets.   Urine osmolality 160, urine sodium less than 30.?Beer potomania Will restrict fluids Will increase salt tablets to 2 g 3 times daily   last sodium reading 119.  Will check sodium twice daily  history of breast cancer on tamoxifen  Tamoxifen  currently on hold.    Acute metabolic encephalopathy secondary to the above Noncontrast head CT was nonacute, no evidence of intracranial bleed Reorient as needed Continue to treat underlying conditions Fall precautions.   Mild non-anion gap acidosis. Improving.  Creatinine is stable at 0.8   elevated liver chemistries AST 58, ALT 57.  Overall remains stable.   Breast cancer on tamoxifen  Resume home regimen Follow-up with oncology outpatient.   History of stroke seen on MRI brain 12/07/2022 Currently not on antiplatelets or statin Monitor on telemetry.   Generalized weakness with possible fall PT OT evaluation Fall precaution. Referred for SNF, TOC  Chronic back pain: Will order for Norco as needed.   Palliative care consulted to discuss goals of care.  Continue current management.  If improvement plan for SNF, if declining son could consider hospice/comfort care.   DVT prophylaxis: Subcu Lovenox  daily.   Code Status: Full code, per her son at bedside.   Family Communication: Updated the patient's son at bedside 1/24   Disposition Plan: Admitted to stepdown unit.   Consults  called: None.   Admission status: Inpatient status.     Status is: Inpatient The patient requires at least 2 midnights for further evaluation and treatment of present condition.   Subjective:  Patient seen and examined at the bedside earlier today.  Son at the bedside.  No acute distress.  Sodium is still low 118 but improving.  Continue with current  management. Objective: Vitals:   04/08/24 1600 04/08/24 1609 04/08/24 1700 04/08/24 1800  BP: 103/60  (!) 113/43 113/70  Pulse: 73  75 77  Resp: 13  18   Temp:  99.2 F (37.3 C)    TempSrc:  Oral    SpO2: 98%  100% 99%  Weight:      Height:        Intake/Output Summary (Last 24 hours) at 04/08/2024 1823 Last data filed at 04/08/2024 1809 Gross per 24 hour  Intake 240 ml  Output 525 ml  Net -285 ml   Filed Weights   04/03/24 1839 04/03/24 2248  Weight: 59.8 kg 55.4 kg    Examination:  General exam: Alert, calm and comfortable, oriented x 2 Respiratory system: Bilateral decreased breath sounds at bases Cardiovascular system: S1 & S2 heard, Rate controlled Gastrointestinal system: Abdomen is nondistended, soft and nontender. Normal bowel sounds heard. Extremities: No cyanosis, clubbing, edema  Central nervous system: Alert and orientedx2. No focal neurological deficits. Moving extremities    Data Reviewed: I have personally reviewed following labs and imaging studies  CBC: Recent Labs  Lab 04/03/24 1853 04/05/24 0302  WBC 8.4 6.5  NEUTROABS 6.5 5.0  HGB 14.4 9.6*  HCT 40.2 27.3*  MCV 88.5 89.8  PLT 295 194   Basic Metabolic Panel: Recent Labs  Lab 04/06/24 0203 04/06/24 0533 04/06/24 1206 04/07/24 0240 04/07/24 1114 04/07/24 1445 04/07/24 1838 04/07/24 2334 04/08/24 0332 04/08/24 1504  NA 113* 114*   < > 116*   < > 118* 118* 118* 120* 119*  K 3.8 4.0  --  4.1  --   --   --   --  4.2 3.8  CL 83* 83*  --  87*  --   --   --   --  90* 89*  CO2 21* 21*  --  18*  --   --   --   --  24 20*  GLUCOSE 90 85  --  80  --   --   --   --  78 103*  BUN 11 11  --  16  --   --   --   --  13 11  CREATININE 0.68 0.65  --  0.62  --   --   --   --  0.82 0.67  CALCIUM 7.7* 7.8*  --  7.8*  --   --   --   --  7.6* 7.6*   < > = values in this interval not displayed.   GFR: Estimated Creatinine Clearance: 45 mL/min (by C-G formula based on SCr of 0.67 mg/dL). Liver  Function Tests: Recent Labs  Lab 04/03/24 1853  AST 58*  ALT 57*  ALKPHOS 121  BILITOT 0.7  PROT 7.7  ALBUMIN 4.1   No results for input(s): LIPASE, AMYLASE in the last 168 hours. No results for input(s): AMMONIA in the last 168 hours. Coagulation Profile: No results for input(s): INR, PROTIME in the last 168 hours. Cardiac Enzymes: No results for input(s): CKTOTAL, CKMB, CKMBINDEX, TROPONINI in the last 168 hours. BNP (last 3 results) No  results for input(s): PROBNP in the last 8760 hours. HbA1C: No results for input(s): HGBA1C in the last 72 hours. CBG: No results for input(s): GLUCAP in the last 168 hours. Lipid Profile: No results for input(s): CHOL, HDL, LDLCALC, TRIG, CHOLHDL, LDLDIRECT in the last 72 hours. Thyroid  Function Tests: No results for input(s): TSH, T4TOTAL, FREET4, T3FREE, THYROIDAB in the last 72 hours. Anemia Panel: No results for input(s): VITAMINB12, FOLATE, FERRITIN, TIBC, IRON, RETICCTPCT in the last 72 hours. Sepsis Labs: Recent Labs  Lab 04/03/24 1841  LATICACIDVEN 1.5    Recent Results (from the past 240 hours)  Culture, blood (routine x 2)     Status: None   Collection Time: 04/03/24  6:41 PM   Specimen: BLOOD  Result Value Ref Range Status   Specimen Description BLOOD  Final   Special Requests NONE  Final   Culture   Final    NO GROWTH 5 DAYS Performed at [facility], [address] [place], [place] [address]    Report Status 04/08/2024 FINAL  Final  Culture, blood (routine x 2)     Status: None   Collection Time: 04/03/24  6:46 PM   Specimen: BLOOD  Result Value Ref Range Status   Specimen Description BLOOD  Final   Special Requests NONE  Final   Culture   Final    NO GROWTH 5 DAYS Performed at [facility], [address] [place], [place] [address]    Report Status 04/08/2024 FINAL  Final  MRSA Next Gen by PCR, Nasal     Status: None   Collection Time: 04/03/24 10:06  PM   Specimen: Nasal Mucosa; Nasal Swab  Result Value Ref Range Status   MRSA by PCR Next Gen NOT DETECTED NOT DETECTED Final    Comment: (NOTE) The GeneXpert MRSA Assay (FDA approved for NASAL specimens only), is one component of a comprehensive MRSA colonization surveillance program. It is not intended to diagnose MRSA infection nor to guide or monitor treatment for MRSA infections. Test performance is not FDA approved in patients less than 63 years old. Performed at [facility], [address] [place], [place] [address]          Radiology Studies: No results found.       Scheduled Meds:  ARIPiprazole   5 mg Oral Daily   Chlorhexidine  Gluconate Cloth  6 each Topical Daily   citalopram   20 mg Oral Daily   enoxaparin  (LOVENOX ) injection  40 mg Subcutaneous Q24H   feeding supplement  237 mL Oral BID BM   pantoprazole   40 mg Oral BID   sodium chloride   2 g Oral TID WC   Continuous Infusions:          Lakima Dona, MD [facility] 04/08/2024, 6:23 PM   "

## 2024-04-08 NOTE — Evaluation (Addendum)
 Clinical/Bedside Swallow Evaluation Patient Details  Name: Marilyn Haas MRN: [id number] Date of Birth: November 15, 1940  Today's Date: 04/08/2024 Time: SLP Start Time (ACUTE ONLY): 1138 SLP Stop Time (ACUTE ONLY): 1200 SLP Time Calculation (min) (ACUTE ONLY): 22 min  Past Medical History:  Past Medical History:  Diagnosis Date   Anemia    Anxiety    Arthritis    Breast cancer (HCC) 05/19/2016   Right breast   Chronic back pain    Scoliosis, stenosis   Depression    Diverticulosis    GERD (gastroesophageal reflux disease)    Hard of hearing    History of blood transfusion    History of colon polyps    History of hiatal hernia    History of pneumonia    Hypertension    Insomnia    Osteopenia    Osteoporosis    Personal history of radiation therapy    Thickened endometrium 09/29/2017   Past Surgical History:  Past Surgical History:  Procedure Laterality Date   BIOPSY  02/07/2022   Procedure: BIOPSY;  Surgeon: Eartha Angelia Sieving, MD;  Location: [facility];  Service: Gastroenterology;;   BREAST LUMPECTOMY WITH RADIOACTIVE SEED AND SENTINEL LYMPH NODE BIOPSY Right 07/31/2016   Procedure: BREAST LUMPECTOMY WITH RADIOACTIVE SEED AND SENTINEL LYMPH NODE BIOPSY, INJECT BLUE DYE RIGHT BREAST;  Surgeon: Gail Favorite, MD;  Location: [facility];  Service: General;  Laterality: Right;   BREAST LUMPECTOMY WITH RADIOACTIVE SEED LOCALIZATION Left 05/07/2021   Procedure: LEFT BREAST LUMPECTOMY WITH RADIOACTIVE SEED LOCALIZATION;  Surgeon: Vanderbilt Ned, MD;  Location: [facility];  Service: General;  Laterality: Left;   COLONOSCOPY     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 07/04/2014   Procedure: LEFT CONVERSION TO TOTAL HIP ARTHROPLASTY;  Surgeon: Dempsey Moan, MD;  Location: [facility];  Service: Orthopedics;  Laterality: Left;   ESOPHAGEAL DILATION N/A 09/24/2017   Procedure: ESOPHAGEAL DILATION;  Surgeon: Golda Claudis PENNER, MD;  Location: [facility];  Service: Endoscopy;   Laterality: N/A;   ESOPHAGEAL DILATION N/A 01/19/2020   Procedure: ESOPHAGEAL DILATION;  Surgeon: Eartha Angelia Sieving, MD;  [facility]: [facility];  Service: Gastroenterology;  Laterality: N/A;   ESOPHAGEAL DILATION  02/28/2020   Procedure: ESOPHAGEAL DILATION;  Surgeon: Golda Claudis PENNER, MD;  [facility]: [facility];  Service: Endoscopy;;   ESOPHAGOGASTRODUODENOSCOPY N/A 01/26/2014   Procedure: ESOPHAGOGASTRODUODENOSCOPY (EGD);  Surgeon: Claudis PENNER Golda, MD;  [facility]: [facility];  Service: Endoscopy;  Laterality: N/A;  1030   ESOPHAGOGASTRODUODENOSCOPY (EGD) WITH PROPOFOL  N/A 09/24/2017   Procedure: ESOPHAGOGASTRODUODENOSCOPY (EGD) WITH PROPOFOL ;  Surgeon: Golda Claudis PENNER, MD;  [facility];  Service: Endoscopy;  Laterality: N/A;  11:15   ESOPHAGOGASTRODUODENOSCOPY (EGD) WITH PROPOFOL  N/A 01/19/2020   Procedure: ESOPHAGOGASTRODUODENOSCOPY (EGD) WITH PROPOFOL ;  Surgeon: Eartha Angelia Sieving, MD;  [facility]: [facility];  Service: Gastroenterology;  Laterality: N/A;  1200   ESOPHAGOGASTRODUODENOSCOPY (EGD) WITH PROPOFOL  N/A 02/28/2020   Procedure: ESOPHAGOGASTRODUODENOSCOPY (EGD) WITH PROPOFOL ;  Surgeon: Golda Claudis PENNER, MD;  [facility];  Service: Endoscopy;  Laterality: N/A;  2:45   ESOPHAGOGASTRODUODENOSCOPY (EGD) WITH PROPOFOL  N/A 02/07/2022   Procedure: ESOPHAGOGASTRODUODENOSCOPY (EGD) WITH PROPOFOL ;  Surgeon: Eartha Angelia Sieving, MD;  [facility]: [facility];  Service: Gastroenterology;  Laterality: N/A;   ESOPHAGOGASTRODUODENOSCOPY (EGD) WITH PROPOFOL  N/A 12/08/2022   Procedure: ESOPHAGOGASTRODUODENOSCOPY (EGD) WITH PROPOFOL ;  Surgeon: Unk Corinn Skiff, MD;  Location: [facility];  Service: Gastroenterology;  Laterality: N/A;   HEMOSTASIS CLIP PLACEMENT  12/08/2022   Procedure: HEMOSTASIS CLIP PLACEMENT;  Surgeon: Unk Corinn Skiff, MD;  Location: [facility];  Service: Gastroenterology;;   HEMOSTASIS CONTROL  12/08/2022   Procedure:  HEMOSTASIS CONTROL;  Surgeon: Unk Corinn Skiff, MD;  Location: [facility];  Service: Gastroenterology;;   HIP FRACTURE SURGERY     05/2009 left -[facility]   HYSTEROSCOPY WITH D & C  05/24/2018   Procedure: DILATATION AND CURETTAGE /HYSTEROSCOPY (PROCEDURE # 2)/PAP SMEAR (PROCEDURE #1);  Surgeon: Edsel Norleen GAILS, MD;  Location: [facility];  Service: Gynecology;;   AGAPITO DILATION N/A 01/26/2014   Procedure: AGAPITO HODGKIN;  Surgeon: Claudis RAYMOND Rivet, MD;  Location: [facility];  Service: Endoscopy;  Laterality: N/A;   HPI:  Marilyn  H Haas is a 84 y.o. female with medical history significant for breast cancer on tamoxifen , hypertension, history of chronic blood loss anemia due to upper GI bleed from duodenal ulcer, chronic anxiety disorder, bilateral sensorineural hearing loss, chronic hyponatremia, who presented to the ER on 04/03/24 due to altered mental status, waxing and waning for weeks, per her son at bedside.  She lives at home with her son who travels often.  He has paid helpers to feed and shower her, however the patient has not been eating her meals or cooperating with the helpers regarding taking showers.  The patient's son came home today and noticed urine and feces different places in the house.  The patient was confused.  Reportedly, she may have had a fall.  He called EMS; CT head revealed remote lacunar infarct in the right paramedian pons similar to prior MRI; ST consulted for a clinical swallow evaluation.    Assessment / Plan / Recommendation  Clinical Impression  Recommend downgrading diet to Dysphagia 3(chopped)/thin liquids d/t oropharyngeal cognitive-based dysphagia observed during evaluation.  ST will f/u for dysphagia tx during acute stay.  Pt seen for clinical swallow evaluation with cognitive-based oropharyngeal dysphagia suspected with impaired mastication (only has upper dentition/dentures, missing lower), slow prolonged oral manipulation/delayed swallow initiation and  intermittent delayed throat clearing observed during lunch tray consumption.  Pt required intermittent min verbal cues (partially d/t being extremely HOH paired with impaired cognition) to initiate swallow prior to next bite and liquid wash A with clearance.  SLP provided A with meal preparation cutting food into smaller bite size. This improved self feeding and need for prolonged mastication.  Pt did demonstrate intermittent delayed throat clearing and expulsion of meat/vegetable in oral cavity that was partially masticated.  Recommend downgrading diet to Dysphagia 3(mechanical soft/chopped) with thin liquids with INTERMITTENT supervision/A for set up/following swallowing strategies during meals d/t impaired cognition.  Medications provided single pills with liquids and/or whole in puree as tolerated if pt begins to retain orally.  ST will f/u for dysphagia tx in acute setting.  Thank you for this consult. SLP Visit Diagnosis: Dysphagia, unspecified (R13.10)    Aspiration Risk  Mild aspiration risk    Diet Recommendation   Thin;Dysphagia 3 (mechanical soft)  Medication Administration: Whole meds with liquid (as tolerated and/or whole/puree)    Other Recommendations Oral Care Recommendations: Oral care BID;Staff/trained caregiver to provide oral care     Swallow Evaluation Recommendations  PO diet (Dysphagia 2(chopped)/thin liquids   Assistance Recommended at Discharge  Full  Functional Status Assessment Patient has had a recent decline in their functional status and demonstrates the ability to make significant improvements in function in a reasonable and predictable amount of time.  Frequency and Duration min 2x/week  1 week  Prognosis Prognosis for improved oropharyngeal function: Good Barriers to Reach Goals: Cognitive deficits      Swallow Study   General Date of Onset: 04/03/24 HPI: Yetzali  H Tejera is a 84 y.o. female with medical history significant for breast cancer on  tamoxifen , hypertension, history of chronic blood loss anemia due to upper GI bleed from duodenal ulcer, chronic anxiety disorder, bilateral sensorineural hearing loss, chronic hyponatremia, who presents to the ER due to altered mental status, waxing and waning for weeks, per her son at bedside.  She lives at home with her son who travels often.  He has paid helpers to feed and shower her, however the patient has not been eating her meals or cooperating with the helpers regarding taking showers.  The patient's son came home today and noticed urine and feces different places in the house.  The patient was confused.  Reportedly, she may have had a fall.  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 Type of Study: Bedside Swallow Evaluation Previous Swallow Assessment: n/a Diet Prior to this Study: Regular;Thin liquids (Level 0) Temperature Spikes Noted: No Respiratory Status: Room air History of Recent Intubation: No Behavior/Cognition: Alert;Requires cueing;Other (Comment) (HOH d/t sensorineural loss) Oral Cavity Assessment: Within Functional Limits Oral Care Completed by SLP: Recent completion by staff Oral Cavity - Dentition: Dentures, top;Missing dentition Vision: Functional for self-feeding Self-Feeding Abilities: Able to feed self;Needs set up;Needs assist Patient Positioning: Upright in bed Baseline Vocal Quality: Low vocal intensity Volitional Cough: Strong Volitional Swallow: Unable to elicit    Oral/Motor/Sensory Function Overall Oral Motor/Sensory Function: Within functional limits   Ice Chips Ice chips: Not tested   Thin Liquid Thin Liquid: Impaired Presentation: Straw Oral Phase Functional Implications: Oral holding;Other (comment) (slight) Pharyngeal  Phase Impairments: Suspected delayed Swallow    Nectar Thick Nectar Thick Liquid: Not tested   Honey Thick Honey Thick  Liquid: Not tested   Puree Puree: Impaired Presentation: Self Fed Oral Phase Functional Implications: Prolonged oral transit Pharyngeal Phase Impairments: Suspected delayed Swallow   Solid     Solid: Impaired Presentation: Self Fed Oral Phase Impairments: Impaired mastication Oral Phase Functional Implications: Impaired mastication;Prolonged oral transit;Other (comment) (expelled partially masticated meat/vegetable from oral cavity) Pharyngeal Phase Impairments: Suspected delayed Swallow      Pat Emmylou Bieker,M.S.,CCC-SLP 04/08/2024,12:01 PM

## 2024-04-08 NOTE — Plan of Care (Signed)

## 2024-04-09 DIAGNOSIS — E871 Hypo-osmolality and hyponatremia: Secondary | ICD-10-CM | POA: Diagnosis not present

## 2024-04-09 LAB — BASIC METABOLIC PANEL WITH GFR
Anion gap: 8 (ref 5–15)
Anion gap: 9 (ref 5–15)
BUN: 10 mg/dL (ref 8–23)
BUN: 13 mg/dL (ref 8–23)
CO2: 22 mmol/L (ref 22–32)
CO2: 20 mmol/L — ABNORMAL LOW (ref 22–32)
Calcium: 7.6 mg/dL — ABNORMAL LOW (ref 8.9–10.3)
Calcium: 7.4 mg/dL — ABNORMAL LOW (ref 8.9–10.3)
Chloride: 89 mmol/L — ABNORMAL LOW (ref 98–111)
Chloride: 89 mmol/L — ABNORMAL LOW (ref 98–111)
Creatinine, Ser: 0.65 mg/dL (ref 0.44–1.00)
Creatinine, Ser: 0.74 mg/dL (ref 0.44–1.00)
GFR, Estimated: >60 mL/min
GFR, Estimated: >60 mL/min
Glucose, Bld: 93 mg/dL (ref 70–99)
Glucose, Bld: 139 mg/dL — ABNORMAL HIGH (ref 70–99)
Potassium: 3.8 mmol/L (ref 3.5–5.1)
Potassium: 3.8 mmol/L (ref 3.5–5.1)
Sodium: 119 mmol/L — CL (ref 135–145)
Sodium: 119 mmol/L — CL (ref 135–145)

## 2024-04-09 NOTE — Progress Notes (Signed)
 " PROGRESS NOTE    Marilyn Haas  FMW:[id number] DOB: March 18, 1940 DOA: 04/03/2024 PCP: Orpha Yancey LABOR, MD   Brief Narrative:    Marilyn Haas is a 84 y.o. 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  She lives at home with her son who travels often.  He has paid helpers to feed and shower her, however the patient has not been eating her meals or cooperating with the helpers regarding taking showers.  The patient's son came home today and noticed urine and feces different places in the house.  The patient was confused.  Reportedly, she may have had a fall.  He called EMS.  No recent use of alcohol per the patient's son at bedside.   In the ER, alert and confused.  No diagnosis of dementia per the son at bedside.  Noncontrast head CT showed no acute intracranial abnormality.  It showed chronic microvascular ischemic change and mild diffuse cerebral volume loss.  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  Air-fluid level in the right sphenoid sinus which can be seen with acute sinusitis.  Serum sodium 113 from baseline of 127.  UA was negative for pyuria. Vital signs stable in the ER  Patient admitted for hyponatremia. Sodium initially improved to 120 from 113, rather quickly than expected.  Received some dextrose , sodium level again trended down.  Now slowly improving with salt tablets.    Assessment & Plan:   Principal Problem:   Hyponatremia Active Problems:   Hypokalemia   Malignant neoplasm of upper-outer quadrant of right breast in female, estrogen receptor positive (HCC)   Frequent falls   Anxiety and depression   Worsening chronic  hyponatremia Baseline serum sodium 127 Presented with serum sodium level of 113--> improved rapidly to 120 after normal saline bolus--> received dextrose  briefly and sodium down to 115.  This has started to improve with salt tablets.  Latest sodium 119.  Will continue his salt tablets 2 g 3 times a day Urine osmolality 160, urine sodium less than 30.?Beer potomania Will restrict fluids  history of breast cancer on tamoxifen  Tamoxifen  currently on hold.    Acute metabolic encephalopathy secondary to the above Noncontrast head CT was nonacute, no evidence of intracranial bleed Reorient as needed Continue to treat underlying conditions Fall precautions.   Mild non-anion gap acidosis. Improving.  Creatinine is stable at 0.8   elevated liver chemistries AST 58, ALT 57.  Overall remains stable.   Breast cancer on tamoxifen  Resume home regimen Follow-up with oncology outpatient.   History of stroke seen on MRI brain 12/07/2022 Currently not on antiplatelets or statin Monitor on telemetry.   Generalized weakness with possible fall PT OT evaluation Fall precaution. Referred for SNF, TOC  Chronic back pain: Will order for Norco as needed.   Palliative care consulted to discuss goals of care.  Continue current management.  If improvement plan for SNF, if declining son could consider hospice/comfort care.   DVT prophylaxis: Subcu Lovenox  daily.   Code Status: Full code, per her son at bedside.   Family Communication: Updated the patient's son at bedside 1/24   Disposition Plan: Admitted to stepdown unit.   Consults called: None.  Admission status: Inpatient status.     Status is: Inpatient The patient requires at least 2 midnights for further evaluation and treatment of present condition.   Subjective:  Patient seen and examined at the bedside earlier today.  Son at the bedside.  No acute distress.  Sodium is still low 119 but improving.  Continue with current  management. Objective: Vitals:   04/09/24 1500 04/09/24 1600 04/09/24 1622 04/09/24 1700  BP: 118/66 (!) 128/55  (!) 100/53  Pulse: 75 74  87  Resp: 18 18  18   Temp:   98.7 F (37.1 C)   TempSrc:   Oral   SpO2: 97% 96%  97%  Weight:      Height:        Intake/Output Summary (Last 24 hours) at 04/09/2024 1819 Last data filed at 04/09/2024 0600 Gross per 24 hour  Intake 240 ml  Output 800 ml  Net -560 ml   Filed Weights   04/03/24 1839 04/03/24 2248  Weight: 59.8 kg 55.4 kg    Examination:  General exam: Alert, calm and comfortable, oriented x 2 Respiratory system: Bilateral decreased breath sounds at bases Cardiovascular system: S1 & S2 heard, Rate controlled Gastrointestinal system: Abdomen is nondistended, soft and nontender. Normal bowel sounds heard. Extremities: No cyanosis, clubbing, edema  Central nervous system: Alert and orientedx2. No focal neurological deficits. Moving extremities    Data Reviewed: I have personally reviewed following labs and imaging studies  CBC: Recent Labs  Lab 04/03/24 1853 04/05/24 0302  WBC 8.4 6.5  NEUTROABS 6.5 5.0  HGB 14.4 9.6*  HCT 40.2 27.3*  MCV 88.5 89.8  PLT 295 194   Basic Metabolic Panel: Recent Labs  Lab 04/06/24 0533 04/06/24 1206 04/07/24 0240 04/07/24 1114 04/07/24 1838 04/07/24 2334 04/08/24 0332 04/08/24 1504 04/09/24 0315  NA 114*   < > 116*   < > 118* 118* 120* 119* 119*  K 4.0  --  4.1  --   --   --  4.2 3.8 3.8  CL 83*  --  87*  --   --   --  90* 89* 89*  CO2 21*  --  18*  --   --   --  24 20* 22  GLUCOSE 85  --  80  --   --   --  78 103* 93  BUN 11  --  16  --   --   --  13 11 10   CREATININE 0.65  --  0.62  --   --   --  0.82 0.67 0.65  CALCIUM 7.8*  --  7.8*  --   --   --  7.6* 7.6* 7.6*   < > = values in this interval not displayed.   GFR: Estimated Creatinine Clearance: 45 mL/min (by C-G formula based on SCr of 0.65 mg/dL). Liver Function Tests: Recent Labs  Lab 04/03/24 1853  AST  58*  ALT 57*  ALKPHOS 121  BILITOT 0.7  PROT 7.7  ALBUMIN 4.1   No results for input(s): LIPASE, AMYLASE in the last 168 hours. No results for input(s): AMMONIA in the last 168 hours. Coagulation Profile: No results for input(s): INR, PROTIME in the last 168 hours. Cardiac Enzymes: No results for input(s): CKTOTAL, CKMB, CKMBINDEX, TROPONINI in the last 168 hours. BNP (last 3 results) No results for input(s): PROBNP in the last 8760 hours. HbA1C: No results for input(s): HGBA1C in the last 72 hours. CBG: No results for input(s): GLUCAP in  the last 168 hours. Lipid Profile: No results for input(s): CHOL, HDL, LDLCALC, TRIG, CHOLHDL, LDLDIRECT in the last 72 hours. Thyroid  Function Tests: No results for input(s): TSH, T4TOTAL, FREET4, T3FREE, THYROIDAB in the last 72 hours. Anemia Panel: No results for input(s): VITAMINB12, FOLATE, FERRITIN, TIBC, IRON, RETICCTPCT in the last 72 hours. Sepsis Labs: Recent Labs  Lab 04/03/24 1841  LATICACIDVEN 1.5    Recent Results (from the past 240 hours)  Culture, blood (routine x 2)     Status: None   Collection Time: 04/03/24  6:41 PM   Specimen: BLOOD  Result Value Ref Range Status   Specimen Description BLOOD  Final   Special Requests NONE  Final   Culture   Final    NO GROWTH 5 DAYS Performed at [facility], [address] [place], [place] [address]    Report Status 04/08/2024 FINAL  Final  Culture, blood (routine x 2)     Status: None   Collection Time: 04/03/24  6:46 PM   Specimen: BLOOD  Result Value Ref Range Status   Specimen Description BLOOD  Final   Special Requests NONE  Final   Culture   Final    NO GROWTH 5 DAYS Performed at [facility], [address], [place], [place] [address]    Report Status 04/08/2024 FINAL  Final  MRSA Next Gen by PCR, Nasal     Status: None   Collection Time: 04/03/24 10:06 PM   Specimen: Nasal Mucosa; Nasal Swab  Result Value  Ref Range Status   MRSA by PCR Next Gen NOT DETECTED NOT DETECTED Final    Comment: (NOTE) The GeneXpert MRSA Assay (FDA approved for NASAL specimens only), is one component of a comprehensive MRSA colonization surveillance program. It is not intended to diagnose MRSA infection nor to guide or monitor treatment for MRSA infections. Test performance is not FDA approved in patients less than 79 years old. Performed at [facility], [address], [place], [place] [address]          Radiology Studies: No results found.       Scheduled Meds:  ARIPiprazole   5 mg Oral Daily   Chlorhexidine  Gluconate Cloth  6 each Topical Daily   citalopram   20 mg Oral Daily   enoxaparin  (LOVENOX ) injection  40 mg Subcutaneous Q24H   feeding supplement  237 mL Oral BID BM   pantoprazole   40 mg Oral BID   sodium chloride   2 g Oral TID WC   Continuous Infusions:          Won Kreuzer, MD [facility] 04/09/2024, 6:19 PM   "

## 2024-04-10 ENCOUNTER — Inpatient Hospital Stay (HOSPITAL_COMMUNITY)

## 2024-04-10 DIAGNOSIS — E871 Hypo-osmolality and hyponatremia: Secondary | ICD-10-CM | POA: Diagnosis not present

## 2024-04-10 LAB — BASIC METABOLIC PANEL WITH GFR
Anion gap: 9 (ref 5–15)
Anion gap: 11 (ref 5–15)
BUN: 14 mg/dL (ref 8–23)
BUN: 20 mg/dL (ref 8–23)
CO2: 25 mmol/L (ref 22–32)
CO2: 20 mmol/L — ABNORMAL LOW (ref 22–32)
Calcium: 7.7 mg/dL — ABNORMAL LOW (ref 8.9–10.3)
Calcium: 7.5 mg/dL — ABNORMAL LOW (ref 8.9–10.3)
Chloride: 90 mmol/L — ABNORMAL LOW (ref 98–111)
Chloride: 92 mmol/L — ABNORMAL LOW (ref 98–111)
Creatinine, Ser: 0.69 mg/dL (ref 0.44–1.00)
Creatinine, Ser: 0.7 mg/dL (ref 0.44–1.00)
GFR, Estimated: >60 mL/min
GFR, Estimated: >60 mL/min
Glucose, Bld: 92 mg/dL (ref 70–99)
Glucose, Bld: 114 mg/dL — ABNORMAL HIGH (ref 70–99)
Potassium: 3.9 mmol/L (ref 3.5–5.1)
Potassium: 3.7 mmol/L (ref 3.5–5.1)
Sodium: 124 mmol/L — ABNORMAL LOW (ref 135–145)
Sodium: 123 mmol/L — ABNORMAL LOW (ref 135–145)

## 2024-04-10 LAB — TSH: TSH: 3.63 u[IU]/mL (ref 0.350–4.500)

## 2024-04-10 LAB — CREATININE, SERUM
Creatinine, Ser: 0.66 mg/dL (ref 0.44–1.00)
GFR, Estimated: >60 mL/min

## 2024-04-10 LAB — CORTISOL: Cortisol, Plasma: 11.5 ug/dL

## 2024-04-10 NOTE — Progress Notes (Signed)
 Physical Therapy Treatment Patient Details Name: Marilyn Haas MRN: [id number] DOB: January 23, 1941 Today's Date: 04/10/2024   History of Present Illness Marilyn Haas is a 84 y.o. 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  She lives at home with her son who travels often.  He has paid helpers to feed and shower her, however the patient has not been eating her meals or cooperating with the helpers regarding taking showers.  The patient's son came home today and noticed urine and feces different places in the house.  The patient was confused.  Reportedly, she may have had a fall.  He called EMS.  No recent use of alcohol per the patient's son at bedside.    PT Comments  Patient just put back to bed per nursing with mod A x 2.  Patient lying in bed on therapist arrival and agreeable to PT session.  PT guides patient in supine therapeutic exercise to include ankle pumps, quad sets, hip abduction, heel slides, hand grip exercise, cervical rotation all x 10 reps each.  patient left in bed with call button in reach, bed alarm set and nursing aware of mobility status.  Patient will benefit from continued skilled therapy services during the remainder of her hospital stay and at the next recommended venue of care to address deficits and promote return to optimal function.         If plan is discharge home, recommend the following: A lot of help with bathing/dressing/bathroom;A lot of help with walking and/or transfers;Help with stairs or ramp for entrance;Assist for transportation;Assistance with cooking/housework   Can travel by private vehicle     No  Equipment Recommendations  None  recommended by PT    Recommendations for Other Services       Precautions / Restrictions Precautions Precautions: Fall Recall of Precautions/Restrictions: Impaired Restrictions Weight Bearing Restrictions Per Provider Order: No     Mobility  Bed Mobility Overal bed mobility: Needs Assistance Bed Mobility: Supine to Sit     Supine to sit: Mod assist, HOB elevated     General bed mobility comments: Labored effort; increased time.    Transfers Overall transfer level: Needs assistance Equipment used: Rolling walker (2 wheels) Transfers: Sit to/from Stand, Bed to chair/wheelchair/BSC Sit to Stand: Mod assist   Step pivot transfers: Mod assist       General transfer comment: EOB to chair with RW; slight posterior lean when frist standing.    Ambulation/Gait Ambulation/Gait assistance: Mod assist   Assistive device: Rolling walker (2 wheels) Gait Pattern/deviations: Decreased step length - right, Decreased step length - left, Decreased stride length           Stairs             Wheelchair Mobility     Tilt Bed    Modified Rankin (Stroke Patients Only)       Balance Overall balance assessment: Needs assistance Sitting-balance support: Feet supported, Bilateral upper extremity supported Sitting balance-Leahy Scale: Fair Sitting balance - Comments: seated at EOB   Standing balance support: Reliant on assistive device for balance, During functional activity, Bilateral upper extremity supported Standing balance-Leahy Scale: Poor Standing balance comment: using RW  Communication Communication Communication: Impaired Factors Affecting Communication: Hearing impaired  Cognition Arousal: Alert Behavior During Therapy: WFL for tasks assessed/performed                             Following commands: Intact      Cueing Cueing Techniques: Verbal cues, Tactile cues, Gestural cues  Exercises Other  Exercises Other Exercises: supine quad sets, ankle pumps, heel slides, hip abduction, hand grips, cervical rotation all x 10 each    General Comments        Pertinent Vitals/Pain Pain Assessment Pain Assessment: No/denies pain Pain Intervention(s): Monitored during session    Home Living                          Prior Function            PT Goals (current goals can now be found in the care plan section) Acute Rehab PT Goals Patient Stated Goal: return home after rehab PT Goal Formulation: With patient/family Time For Goal Achievement: 04/18/24 Potential to Achieve Goals: Good Progress towards PT goals: Progressing toward goals    Frequency    Min 3X/week      PT Plan      Co-evaluation              AM-PAC PT 6 Clicks Mobility   Outcome Measure  Help needed turning from your back to your side while in a flat bed without using bedrails?: A Lot Help needed moving from lying on your back to sitting on the side of a flat bed without using bedrails?: A Lot Help needed moving to and from a bed to a chair (including a wheelchair)?: A Lot Help needed standing up from a chair using your arms (e.g., wheelchair or bedside chair)?: A Lot Help needed to walk in hospital room?: A Lot Help needed climbing 3-5 steps with a railing? : Total 6 Click Score: 11    End of Session   Activity Tolerance: Patient tolerated treatment well;Patient limited by fatigue Patient left: in bed;with call bell/phone within reach Nurse Communication: Mobility status PT Visit Diagnosis: Unsteadiness on feet (R26.81);Other abnormalities of gait and mobility (R26.89);Muscle weakness (generalized) (M62.81)     Time: 8669-8653 PT Time Calculation (min) (ACUTE ONLY): 16 min  Charges:    $Therapeutic Exercise: 8-22 mins PT General Charges $$ ACUTE PT VISIT: 1 Visit                     1:54 PM, 04/10/24 Marilyn Haas Marilyn Haas MPT [facility] [place]  [phone] Ph:[phone]

## 2024-04-10 NOTE — Progress Notes (Signed)
 Speech Language Pathology Treatment: Dysphagia  Patient Details Name: Marilyn Haas MRN: [id number] DOB: 06/27/1940 Today's Date: 04/10/2024 Time: 8482-8465 SLP Time Calculation (min) (ACUTE ONLY): 17 min  Assessment / Plan / Recommendation Clinical Impression  Pt seen for ongoing dysphagia intervention following clinical swallow evaluation completed on Saturday. Pt alert and cooperative, upper dentures in place. SLP recommended D3/mech soft on Saturday and it appears that Pt was downgraded to D2 on Sunday, but also had Tess Roger at bedside. SLP assessed with Pt with the cookies and straw sips of thin soda- Pt able to self present with assist to hand. She exhibited prolonged mastication and min lingual residuals with cookies and benefited from alternating solids/liquids to clear. Pt without overt signs or symptoms of aspiration. Recommend D3/mech soft and thin liquids with supervision for meals and PO medication whole with water  or in puree. SLP will sign off.     HPI HPI: Marilyn Haas is a 84 y.o. 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  She lives at home with her son who travels often.  He has paid helpers to feed and shower her, however the patient has not been eating her meals or cooperating with the helpers regarding taking showers.  The patient's son came home today and noticed urine and feces different places in the house.  The patient was confused.  Reportedly, she may have had a fall.  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      SLP Plan  Discharge SLP  treatment due to (comment);All goals met        Swallow Evaluation Recommendations   Recommendations: PO diet PO Diet Recommendation: Dysphagia 3 (Mechanical soft);Thin liquids (Level 0) Liquid Administration via: Cup;Straw Medication Administration: Whole meds with liquid Supervision: Staff to assist with self-feeding;Intermittent supervision/cueing for swallowing strategies Postural changes: Stay upright 30-60 min after meals;Position pt fully upright for meals Oral care recommendations: Oral care BID (2x/day);Staff/trained caregiver to provide oral care     Recommendations                     Oral care BID;Staff/trained caregiver to provide oral care     Dysphagia, unspecified (R13.10)     Discharge SLP treatment due to (comment);All goals met   Thank you,  Lamar Candy, CCC-SLP [phone]   Taquan Bralley  04/10/2024, 3:37 PM

## 2024-04-10 NOTE — Progress Notes (Signed)
 " PROGRESS NOTE    Marilyn  KENLIE Haas  FMW:[id number] DOB: [birth year] DOA: 04/03/2024 PCP: Orpha Yancey LABOR, MD   Brief Narrative:    Marilyn Haas is a 84 y.o. 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  She lives at home with her son who travels often.  He has paid helpers to feed and shower her, however the patient has not been eating her meals or cooperating with the helpers regarding taking showers.  The patient's son came home today and noticed urine and feces different places in the house.  The patient was confused.  Reportedly, she may have had a fall.  He called EMS.  No recent use of alcohol per the patient's son at bedside.   In the ER, alert and confused.  No diagnosis of dementia per the son at bedside.  Noncontrast head CT showed no acute intracranial abnormality.  It showed chronic microvascular ischemic change and mild diffuse cerebral volume loss.  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  Air-fluid level in the right sphenoid sinus which can be seen with acute sinusitis.  Serum sodium 113 from baseline of 127.  UA was negative for pyuria. Vital signs stable in the ER  Patient admitted for hyponatremia. Sodium initially improved to 120 from 113, rather quickly than expected.  Received some dextrose , sodium level again trended down.  Now slowly improving with salt tablets.    Assessment & Plan:   Principal Problem:   Hyponatremia Active Problems:   Hypokalemia   Malignant neoplasm of upper-outer quadrant of right breast in female, estrogen receptor positive (HCC)   Frequent falls   Anxiety and depression   Worsening chronic  hyponatremia Baseline serum sodium 127 Presented with serum sodium level of 113--> improved rapidly to 120 after normal saline bolus--> received dextrose  briefly and sodium down to 115.  This has started to improve with salt tablets.  Latest sodium 119.  Will continue his salt tablets 2 g 3 times a day Urine osmolality 160, urine sodium less than 30.?Beer potomania Will restrict fluids  history of breast cancer on tamoxifen  Tamoxifen  currently on hold.    Acute metabolic encephalopathy secondary to the above Noncontrast head CT was nonacute, no evidence of intracranial bleed Reorient as needed Continue to treat underlying conditions Fall precautions.   Mild non-anion gap acidosis. Improving.  Creatinine is stable at 0.8   elevated liver chemistries AST 58, ALT 57.  Overall remains stable.   Breast cancer on tamoxifen  Resume home regimen Follow-up with oncology outpatient.   History of stroke seen on MRI brain 12/07/2022 Currently not on antiplatelets or statin Monitor on telemetry.   Generalized weakness with possible fall PT OT evaluation Fall precaution. Referred for SNF, TOC  Chronic back pain: Will order for Norco as needed.   Palliative care consulted to discuss goals of care.  Continue current management.  If improvement plan for SNF, if declining son could consider hospice/comfort care.   DVT prophylaxis: Subcu Lovenox  daily.   Code Status: Full code, per her son at bedside.   Family Communication: Updated the patient's son at bedside 1/24   Disposition Plan: Admitted to stepdown unit.   Consults called: None.  Admission status: Inpatient status.     Status is: Inpatient The patient requires at least 2 midnights for further evaluation and treatment of present condition.   Subjective:  Patient seen and examined at the bedside earlier today.  Son at the bedside.  No acute distress.  Sodium is still low 119.   Continue with current  management. Objective: Vitals:   04/10/24 1144 04/10/24 1300 04/10/24 1400 04/10/24 1500  BP:  (!) 117/52 125/61 (!) 124/57  Pulse:  86 86   Resp:  17 20 (!) 22  Temp: 98.3 F (36.8 C)     TempSrc: Oral     SpO2:  97% 94%   Weight:      Height:        Intake/Output Summary (Last 24 hours) at 04/10/2024 1524 Last data filed at 04/10/2024 0900 Gross per 24 hour  Intake 360 ml  Output 1100 ml  Net -740 ml   Filed Weights   04/03/24 1839 04/03/24 2248 04/10/24 0457  Weight: 59.8 kg 55.4 kg 59 kg    Examination:  General exam: Alert, calm and comfortable, oriented x 2 Respiratory system: Bilateral decreased breath sounds at bases Cardiovascular system: S1 & S2 heard, Rate controlled Gastrointestinal system: Abdomen is nondistended, soft and nontender. Normal bowel sounds heard. Extremities: No cyanosis, clubbing, edema  Central nervous system: Alert and orientedx2. No focal neurological deficits. Moving extremities    Data Reviewed: I have personally reviewed following labs and imaging studies  CBC: Recent Labs  Lab 04/03/24 1853 04/05/24 0302  WBC 8.4 6.5  NEUTROABS 6.5 5.0  HGB 14.4 9.6*  HCT 40.2 27.3*  MCV 88.5 89.8  PLT 295 194   Basic Metabolic Panel: Recent Labs  Lab 04/08/24 0332 04/08/24 1504 04/09/24 0315 04/09/24 1910 04/10/24 0337 04/10/24 0859  NA 120* 119* 119* 119*  --  124*  K 4.2 3.8 3.8 3.8  --  3.9  CL 90* 89* 89* 89*  --  90*  CO2 24 20* 22 20*  --  25  GLUCOSE 78 103* 93 139*  --  92  BUN 13 11 10 13   --  14  CREATININE 0.82 0.67 0.65 0.74 0.66 0.69  CALCIUM 7.6* 7.6* 7.6* 7.4*  --  7.7*   GFR: Estimated Creatinine Clearance: 46.3 mL/min (by C-G formula based on SCr of 0.69 mg/dL). Liver Function Tests: Recent Labs  Lab 04/03/24 1853  AST 58*  ALT 57*  ALKPHOS 121  BILITOT 0.7  PROT 7.7  ALBUMIN 4.1   No results for input(s): LIPASE, AMYLASE in the last 168 hours. No results for input(s): AMMONIA in the last 168  hours. Coagulation Profile: No results for input(s): INR, PROTIME in the last 168 hours. Cardiac Enzymes: No results for input(s): CKTOTAL, CKMB, CKMBINDEX, TROPONINI in the last 168 hours. BNP (last 3 results) No results for input(s): PROBNP in the last 8760 hours. HbA1C: No results for input(s): HGBA1C in the last 72 hours. CBG: No results for input(s): GLUCAP in the last 168 hours. Lipid Profile: No results for input(s): CHOL, HDL, LDLCALC, TRIG, CHOLHDL, LDLDIRECT in the last 72 hours. Thyroid  Function Tests: Recent Labs    04/10/24 0859  TSH 3.630   Anemia Panel: No results for input(s): VITAMINB12, FOLATE, FERRITIN, TIBC, IRON, RETICCTPCT in the last 72 hours. Sepsis Labs: Recent Labs  Lab 04/03/24 1841  LATICACIDVEN 1.5    Recent Results (from the past 240 hours)  Culture, blood (routine x 2)     Status:  None   Collection Time: 04/03/24  6:41 PM   Specimen: BLOOD  Result Value Ref Range Status   Specimen Description BLOOD  Final   Special Requests NONE  Final   Culture   Final    NO GROWTH 5 DAYS Performed at [facility], [address], [place], [place] [address]    Report Status 04/08/2024 FINAL  Final  Culture, blood (routine x 2)     Status: None   Collection Time: 04/03/24  6:46 PM   Specimen: BLOOD  Result Value Ref Range Status   Specimen Description BLOOD  Final   Special Requests NONE  Final   Culture   Final    NO GROWTH 5 DAYS Performed at [facility], [address], [place], [place] [address]    Report Status 04/08/2024 FINAL  Final  MRSA Next Gen by PCR, Nasal     Status: None   Collection Time: 04/03/24 10:06 PM   Specimen: Nasal Mucosa; Nasal Swab  Result Value Ref Range Status   MRSA by PCR Next Gen NOT DETECTED NOT DETECTED Final    Comment: (NOTE) The GeneXpert MRSA Assay (FDA approved for NASAL specimens only), is one component of a comprehensive MRSA colonization surveillance program. It  is not intended to diagnose MRSA infection nor to guide or monitor treatment for MRSA infections. Test performance is not FDA approved in patients less than 19 years old. Performed at [facility], [address] [place], [place] [address]          Radiology Studies: No results found.       Scheduled Meds:  ARIPiprazole   5 mg Oral Daily   Chlorhexidine  Gluconate Cloth  6 each Topical Daily   citalopram   20 mg Oral Daily   enoxaparin  (LOVENOX ) injection  40 mg Subcutaneous Q24H   feeding supplement  237 mL Oral BID BM   pantoprazole   40 mg Oral BID   sodium chloride   2 g Oral TID WC   Continuous Infusions:          Tyner Codner, MD [facility] 04/10/2024, 3:24 PM   "

## 2024-04-10 NOTE — Progress Notes (Signed)
 "                                                                                                                                                         Daily Progress Note   Patient Name: Marilyn Haas  HISAKO Haas       Date: 04/10/2024 DOB: 06-23-1940  Age: 84 y.o. MRN#: [id number] Attending Physician: Mcarthur Pick, MD Primary Care Physician: Orpha Yancey LABOR, MD Admit Date: 04/03/2024  Reason for initial Consultation/Follow-up: Disposition and Establishing goals of care  Subjective:  Marilyn Haas Haas is a 84 y.o. 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 She presented to the ED for confusion and possible fall.   Workup revealed profound hyponatremia in the setting or poor oral intake. IV fluids administered and improved. Received some dextrose  and Na+ trended down but improving with oral salt tabs.  Labs and diagnostics reviewed: Sodium continues to be quite low (most recent level 119 mmol/L) up from admission of 113 mmol/L. Renal function stable. Hypocalcemia and low CO2 levels. Mild elevated transaminases. WBCs normal. H/H downtrending. ? Dilution.   V/S reviewed and relatively stable. Some mild intermittent hypertension noted. MAR reviewed. No PRN symptom medications given on 24 hr lookback.  Discussed case with nursing and attending physician. No nursing concerns expressed at this time. She reports patient is hard of hearing. She is pleasantly confused. Per attending sodium level remains low but improving.   Initial palliative consult completed 04/06/2024 and discussion completed. Plan was time for outcomes and continue full code/full scope in the interim. Some improvements noted and therefore per attending team plan is to d/c to SNF.   Today, patient is pleasant.  Denies any pain or  shortness of breath.  She states she is tired.  She continues to have a poor appetite.  Her goal is to return home.  She show she lives by herself but her son is nearby for support.  Agreeable to rehab if needed.  She request that I speak to her son regarding any medical decisions. She does not have a formal HCPOA or living will but says to call any of her sons and they can help.   Chart review/care coordination:  Completed extensive chart review including EPIC notes, labs, MAR, vital signs. Coordinated care directly with attending physician, TOC, and bedside nursing staff.   Called and spoke with patient's son [name]) and provided update. Discussed patient's progress. Na+ level improving gradually. We discussed who she was before and the recent declines he has noticed in her cognition and functional levels. She was apparently someone who was always very put together and took pride in her appearance. He does not  feel she would find a life where she was dependent on others or unable to care for herself acceptable.  We discussed patient's health prior to this admission.  Patient son shares she has been having declining functional and cognitive status for some time now.  He is concerned about patient's ability to live at home alone.    We discussed disease trajectory of frailty and possible neurocognitive disorders.  Given goals of care expressed as desire to maintain function/quality of life is appropriate to discuss CODE STATUS. Discussed with patient's age and comorbidities, we discussed the limitations and potential burdens of CPR and intubation, particularly in the context of advanced age and serious underlying health conditions.  Following a goals-of-care conversation, the patient's son opted for DNR/DNI status and this has been documented in the medical record.  We discussed various treatment options for patient at this time. Prior plan was to allow time for outcomes. We discussed that while  patient sodium level has been improving she continues to be very confused and have poor appetite.  We discussed that there is a high likelihood of recurrent hyponatremia and future illness.  We discussed the utility of feeding tube as well as the benefits and limitations in individuals with chronic and irreversible conditions.  Patient son states that he does not feel she would desire a feeding tube or that this would be helpful for her.  Patient's son also expresses concern about her ability to live alone.   We discussed options of continuing to treat the treatable versus transition to comfort care.  Patient son expresses concern about her ability to live alone especially given possibility of irreversible functional decline and poor appetite. She has been having great difficulty caring for herself at home prior to this admission and now has significant cognitive and functional declines. He is interested in discussing what comfort care would look like versus continuing to treat the treatable and disposition options after discharge.  We discussed the differences between outpatient palliative care versus hospice. Educated the patient on the philosophy and goals of palliative care, including its focus on symptom management and quality of life.  Then discussed the differences between palliative and hospice including the discussing the multiple hospice care settings, including home-based hospice, long-term care facilities with hospice services, and inpatient hospice units. Reviewed the nature of care delivery in each setting, admission criteria, and associated financial considerations.  We discussed the options of continuing skilled therapies and ongoing monitoring of appetite and sodium levels to determine next steps.  Patient son interested in referral to skilled nursing facility for short-term rehab with outpatient palliative follow-up if she has improvement in appetite and therapy potential.  If patient's  appetite continues to decline and therapy potential is poor he would be interested in inpatient hospice.  He will explore residential hospice options if they decide to go to comfort care route as again he is concerned about her ability to live at home alone.   Length of Stay: 7   Physical Exam Constitutional:      General: She is not in acute distress.    Appearance: She is ill-appearing. She is not toxic-appearing.  HENT:     Head:     Comments: Extremely hard of hearing, assisted in placing hearing aide in right ear Pulmonary:     Effort: Pulmonary effort is normal. No respiratory distress.  Skin:    General: Skin is warm and dry.     Findings: Bruising present.     Comments:  Thin, fragile skin with scattered ecchymosis present to bilateral upper extremities consistent with frequent lab draws.   Neurological:     Mental Status: She is alert.             Vital Signs: BP 136/64   Pulse 86   Temp 98.5 F (36.9 C) (Oral)   Resp 17   Ht 5' 5 (1.651 m)   Wt 59 kg   SpO2 95%   BMI 21.64 kg/m  SpO2: SpO2: 95 % O2 Device: O2 Device: Room Air O2 Flow Rate:        Palliative Assessment/Data: 40%   Palliative Care Assessment & Plan   Patient Profile/Assessment:  84 year old female admitted to the hospital with profound hyponatremia in the setting of poor oral intake.  Sodium levels gradually improving but remain low.  Patient continues to have poor appetite, declining cognition, and poor functional status.  Extensive discussion completed with patient's son given patient's confusion and patient's request to speak with son.  Discussed various treatment options as detailed above.  Goals of care elicited.  Goals of care at this time are to maintain quality of life and function as able.  CODE STATUS updated following goals of care discussion from full code to DNR/DNI.  Plan is to monitor patient's appetite and have therapy follow-up to determine rehab potential.  If patient continues  to decline we will pursue hospice versus if some improvements are made in cognition and functional status we will pursue short-term rehab with outpatient palliative follow-up as anticipate future decline and low threshold for transitioning to comfort care given goals and current status.  Recommendations/Plan:  Code status updated from full code to DNR/DNI Continue to treat the treatable, time for outcomes Await further recommendations and evaluation from PT/OT/ST regarding rehab potential and disposition options PMT to continue to follow   Symptom management:  Symptoms stable at present, therefore continue symptom regimen per admitting team with PMT available as needed for support  Prognosis:  Guarded, poor     Discharge Planning: To Be Determined    Detailed review of medical records (labs, imaging, vital signs), medically appropriate exam, discussed with treatment team, counseling and education to patient, family, & staff, documenting clinical information, medication management, coordination of care   Total time:  I personally spent a total of 55 minutes in the care of the patient today including preparing to see the patient, getting/reviewing separately obtained history, performing a medically appropriate exam/evaluation, counseling and educating, referring and communicating with other health care professionals, documenting clinical information in the EHR, and coordinating care.          Laymon CHRISTELLA Pinal, NP  Palliative Medicine Team Team phone # [phone]  Thank you for allowing the Palliative Medicine Team to assist in the care of this patient. Please utilize secure chat with additional questions, if there is no response within 30 minutes please call the above phone number.  Palliative Medicine Team providers are available by phone from 7am to 7pm daily and can be reached through the team cell phone.  Should this patient require assistance outside of these hours,  please call the patient's attending physician.   "

## 2024-04-10 NOTE — Plan of Care (Signed)
" °  Problem: Education: Goal: Knowledge of General Education information will improve Description: Including pain rating scale, medication(s)/side effects and non-pharmacologic comfort measures Outcome: Progressing   Problem: Health Behavior/Discharge Planning: Goal: Ability to manage health-related needs will improve Outcome: Progressing   Problem: Clinical Measurements: Goal: Ability to maintain clinical measurements within normal limits will improve Outcome: Progressing Goal: Will remain free from infection Outcome: Progressing Goal: Diagnostic test results will improve Outcome: Progressing Goal: Respiratory complications will improve Outcome: Progressing Goal: Cardiovascular complication will be avoided Outcome: Progressing   Problem: Activity: Goal: Risk for activity intolerance will decrease Outcome: Progressing   Problem: Nutrition: Goal: Adequate nutrition will be maintained Outcome: Not Progressing   Problem: Coping: Goal: Level of anxiety will decrease Outcome: Progressing   Problem: Elimination: Goal: Will not experience complications related to bowel motility Outcome: Progressing Goal: Will not experience complications related to urinary retention Outcome: Not Progressing   Problem: Pain Managment: Goal: General experience of comfort will improve and/or be controlled Outcome: Progressing   Problem: Safety: Goal: Ability to remain free from injury will improve Outcome: Progressing   Problem: Skin Integrity: Goal: Risk for impaired skin integrity will decrease Outcome: Progressing   "

## 2024-04-11 DIAGNOSIS — E871 Hypo-osmolality and hyponatremia: Secondary | ICD-10-CM | POA: Diagnosis not present

## 2024-04-11 LAB — BASIC METABOLIC PANEL WITH GFR
Anion gap: 10 (ref 5–15)
BUN: 17 mg/dL (ref 8–23)
CO2: 21 mmol/L — ABNORMAL LOW (ref 22–32)
Calcium: 7.8 mg/dL — ABNORMAL LOW (ref 8.9–10.3)
Chloride: 91 mmol/L — ABNORMAL LOW (ref 98–111)
Creatinine, Ser: 0.67 mg/dL (ref 0.44–1.00)
GFR, Estimated: >60 mL/min
Glucose, Bld: 94 mg/dL (ref 70–99)
Potassium: 3.6 mmol/L (ref 3.5–5.1)
Sodium: 122 mmol/L — ABNORMAL LOW (ref 135–145)

## 2024-04-11 MED ORDER — TAMOXIFEN CITRATE 10 MG PO TABS
10.0000 mg | ORAL_TABLET | ORAL | Status: DC
  Administered 2024-04-11 – 2024-04-13 (×2): 10 mg via ORAL
  Filled 2024-04-11 (×3): qty 1

## 2024-04-11 MED ORDER — SODIUM CHLORIDE 0.9 % IV BOLUS
250.0000 mL | Freq: Once | INTRAVENOUS | Status: AC
  Administered 2024-04-11: 250 mL via INTRAVENOUS

## 2024-04-11 NOTE — Progress Notes (Signed)
 "                                                                                                                                                         Daily Progress Note   Patient Name: Marilyn Haas       Date: 04/11/2024 DOB: [birth year]  Age: 84 y.o. MRN#: [id number] Attending Physician: Marilyn Pick, MD Primary Care Physician: Marilyn Haas LABOR, MD Admit Date: 04/03/2024  Reason for Initial Consultation/Follow-up: Establishing goals of care  Subjective:  Marilyn Haas is a 84 y.o. 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 She presented to the ED for confusion and possible fall.    Workup revealed profound hyponatremia in the setting or poor oral intake. IV fluids administered and improved. Received some dextrose  and Na+ trended down but improving with oral salt tabs.   Initial palliative consult completed 04/06/2024.  Plan was time for outcomes and continue full code/full scope in the interim.  04/11/2024: Follow-up with son he has been considering discussion With previous palliative provider.  Goals of care and advance care planning discussed and patient was transition to DNR/DNI, continue to treat the treatable, continue to monitor for outcome.  If she continues to have poor p.o. intake and confusion will consider comfort/hospice and if improvement would consider short-term rehab.  Today, labs reviewed.  Continues to be hyponatremic.  Sodium level peaked at 124 and most recent 122.  Would recommend continuing to restrict fluids and sodium supplementation.  Vital signs reviewed and appear relatively stable.  No significant abnormalities warrant adjustment in treatment.  MAR reviewed.  No as needed meds required on 24 hour look back.  Patient with variable cognition therefore  independent history obtained from nursing staff.  They states she seems to be doing a little bit better today but with periods of intermittent confusion no significant nursing concerns reported at this time.  At bedside, patient is sitting up with her lunch tray.  She states she is doing fine.  She denies any pain, shortness of breath, nausea, or vomiting.  Appetite improved from yesterday as she has eaten part of her lunch tray (estimated 25 to 30%).  She is oriented to time, place, and person which is improved from yesterday.  Reviewed therapy notes.  Speech therapy established safe diet and discontinued further follow-up as goals were met. PT recommending skilled therapy at discharge given significant decline in functional status and need for quite a bit of assistance.  Engaged in a detailed conversation with the patient about the seriousness of her current illness, in light of her advanced age and complex medical history, and explained the potential implications this  may have for her recovery and long-term well-being.  Patient states her ultimate goal is to return home.  She would not want a life dependent on machines.  She is agreeable to a short-term stay in the facility but ultimately desires to return home.  Given goals of care and age/comorbidities discussed the limitations and potential burdens of CPR and intubation in the context of someone with similar age and comorbidities. We discussed how the potential consequences of surviving CPR could significantly affect a person's quality of life--particularly if their definition of quality of life involves maintaining independence and functional ability.  Following a goals-of-care conversation, the patient opted for DNR/DNI status and this has been documented in the medical record.  This is consistent with what her son requested yesterday.  [place]rod form completed.    Chart review/care coordination:  Completed extensive chart review including EPIC  notes, MAR, vital signs. Coordinated care with attending physician, nursing, and TOC.   Spoke with patient's son Marilyn Haas and son Marilyn Haas provide an update. Both sons in agreement with DNR/DNI. They would like to focus on quality of life. Given some improvements they are ok with STR for skilled therapy with OP Pall follow up.    Length of Stay: 8   Physical Exam Constitutional:      General: She is not in acute distress.    Appearance: She is not toxic-appearing.  HENT:     Head:     Comments: Very hard of hearing Pulmonary:     Effort: Pulmonary effort is normal. No respiratory distress.  Skin:    General: Skin is warm and dry.     Findings: Bruising present.  Neurological:     Mental Status: She is alert.             Vital Signs: BP (!) 123/57   Pulse 78   Temp 98.2 F (36.8 C) (Axillary)   Resp 13   Ht 5' 5 (1.651 m)   Wt 59 kg   SpO2 99%   BMI 21.64 kg/m  SpO2: SpO2: 99 % O2 Device: O2 Device: Room Air O2 Flow Rate:        Palliative Assessment/Data: 30-40%   Palliative Care Assessment & Plan   Patient Profile/Assessment:   84 year old female admitted to the hospital with profound hyponatremia in the setting of poor oral intake.  Sodium levels gradually improving but remain low.  Patient continues to have poor appetite, declining cognition, and poor functional status.  Extensive discussion completed with patient's sons and patient. Code status: DNR/DNI. Some gradual improvements noted today. Therapy recommends skilled rehab given functional declines. Family concerned about safety at home as well. Therefore, tentative plan is to pursue short-term rehab with outpatient palliative follow-up as anticipate future decline and low threshold for transitioning to comfort care given goals and current status. If she declines further then transition to comfort/re-consider comfort care and referral to hospice. PMT to continue to follow.    Recommendations/Plan:  DNR/DNI Treat the treatable, time for outcomes Gradual improvements noted, therapy recommends skilled therapy at d/c, therefore tentative plan is to d/c to SNF for STR  OP Pall referral at discharge Palliative medicine team will continue to follow for ongoing goals of care discussion, symptom management, and coordination of care.   Symptom management:  Symptoms stable at present, therefore continue symptom regimen per admitting team with PMT available as needed for support   Prognosis:  Guarded, fair    Discharge Planning: To Be Determined  Detailed review of medical records (labs, imaging, vital signs), medically appropriate exam, discussed with treatment team, counseling and education to patient, family, & staff, documenting clinical information, medication management, coordination of care   Total time:  I personally spent a total of 45 minutes in the care of the patient today including preparing to see the patient, getting/reviewing separately obtained history, performing a medically appropriate exam/evaluation, counseling and educating, referring and communicating with other health care professionals, documenting clinical information in the EHR, and coordinating care.    Laymon CHRISTELLA Pinal, NP  Palliative Medicine Team Team phone # [phone]  Thank you for allowing the Palliative Medicine Team to assist in the care of this patient. Please utilize secure chat with additional questions, if there is no response within 30 minutes please call the above phone number.  Palliative Medicine Team providers are available by phone from 7am to 7pm daily and can be reached through the team cell phone.  Should this patient require assistance outside of these hours, please call the patient's attending physician.   "

## 2024-04-11 NOTE — Plan of Care (Signed)

## 2024-04-11 NOTE — Progress Notes (Signed)
 " PROGRESS NOTE    Marilyn Haas  Marilyn Haas  FMW:[id number] DOB: 1940/07/30 DOA: 04/03/2024 PCP: Marilyn Haas LABOR, MD   Brief Narrative:   HPI on admission:  Marilyn Haas  H Chaires is a 84 y.o. 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  She lives at home with her son who travels often.  He has paid helpers to feed and shower her, however the patient has not been eating her meals or cooperating with the helpers regarding taking showers.  The patient's son came home and noticed urine and feces different places in the house.  The patient was confused.  Reportedly, she may have had a fall.  He called EMS.  Patient has been drinking alcohol(mainly beer) for a long time   In the ER, alert and confused.  No diagnosis of dementia per the son at bedside.  Noncontrast head CT showed no acute intracranial abnormality.  It showed chronic microvascular ischemic change and mild diffuse cerebral volume loss.  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  Air-fluid level in the right sphenoid sinus which can be seen with acute sinusitis.  Serum sodium 113 from baseline of 127.  UA was negative for pyuria. Vital signs stable in the ER  Patient admitted for hypotonic hyponatremia.  Concern for beer potomania Sodium initially improved to 120 from 113, rather quickly than expected.  Received some dextrose , sodium level again trended down.  Now very slowly improving with salt tablets. Sodium 123.  Baseline appears to be between 125-130    Assessment & Plan:   Principal Problem:   Hyponatremia Active Problems:   Hypokalemia   Malignant neoplasm of upper-outer quadrant of right breast in  female, estrogen receptor positive (HCC)   Frequent falls   Anxiety and depression   Worsening chronic hyponatremia Baseline serum sodium between 125-130. Presented with serum sodium level of 113--> improved rapidly to 120 after normal saline bolus--> received dextrose  briefly and sodium down to 115.  This has started to improve with salt tablets.  Latest sodium 119.  Will continue his salt tablets 2 g 3 times a day Continue to 40, urine osmolality 160, urine sodium less than 30.?Beer potomania, poor intake of food Will restrict fluids Cortisol, TSH is normal  Acute metabolic encephalopathy secondary to the above Now at baseline Noncontrast head CT was nonacute, no evidence of intracranial bleed Reorient as needed Continue to treat underlying conditions Fall precautions.   Mild non-anion gap acidosis. Improving.  Creatinine is stable at 0.8   elevated liver chemistries AST 58, ALT 57 on admission. Recheck in the am  Breast cancer on tamoxifen  Resume home regimen Follow-up with oncology outpatient.   History of stroke seen on MRI brain 12/07/2022 Currently not on antiplatelets or statin Monitor on telemetry.   Generalized weakness with possible fall PT OT evaluation Fall precaution. TOC on board for SNF  Chronic back pain: Will order for Norco as needed.   Palliative care consulted to discuss goals of care.  Plan is to short-term rehab.  DNR/DNI.  Palliative follow-up outpatient.  DVT prophylaxis: Subcu Lovenox  daily.   Code Status: Full code, per her son at bedside.   Family Communication: Updated the patient's son at bedside  1/26   Disposition Plan: Admitted to stepdown unit.   Consults called: None.   Admission status: Inpatient status.     Status is: Inpatient The patient requires at least 2 midnights for further evaluation and treatment of present condition.   Subjective:  Patient seen and examined at the bedside earlier today.  No family today at the  bedside.  No acute distress.  Sodium is up to 123 today.   Continue with current management. Objective: Vitals:   04/11/24 1100 04/11/24 1200 04/11/24 1309 04/11/24 1340  BP: (!) 123/57 104/64 98/68   Pulse: 78     Resp: 13 16 13    Temp:    97.6 F (36.4 C)  TempSrc:    Oral  SpO2: 99%     Weight:      Height:        Intake/Output Summary (Last 24 hours) at 04/11/2024 1420 Last data filed at 04/11/2024 0400 Gross per 24 hour  Intake 100 ml  Output 450 ml  Net -350 ml   Filed Weights   04/03/24 1839 04/03/24 2248 04/10/24 0457  Weight: 59.8 kg 55.4 kg 59 kg    Examination:  General exam: Alert, calm and comfortable, oriented x 2 Respiratory system: Bilateral decreased breath sounds at bases Cardiovascular system: S1 & S2 heard, Rate controlled Gastrointestinal system: Abdomen is nondistended, soft and nontender. Normal bowel sounds heard. Extremities: No cyanosis, clubbing, edema  Central nervous system: Alert and orientedx2. No focal neurological deficits. Moving extremities    Data Reviewed: I have personally reviewed following labs and imaging studies  CBC: Recent Labs  Lab 04/05/24 0302  WBC 6.5  NEUTROABS 5.0  HGB 9.6*  HCT 27.3*  MCV 89.8  PLT 194   Basic Metabolic Panel: Recent Labs  Lab 04/09/24 0315 04/09/24 1910 04/10/24 0337 04/10/24 0859 04/10/24 2011 04/11/24 0813  NA 119* 119*  --  124* 123* 122*  K 3.8 3.8  --  3.9 3.7 3.6  CL 89* 89*  --  90* 92* 91*  CO2 22 20*  --  25 20* 21*  GLUCOSE 93 139*  --  92 114* 94  BUN 10 13  --  14 20 17   CREATININE 0.65 0.74 0.66 0.69 0.70 0.67  CALCIUM 7.6* 7.4*  --  7.7* 7.5* 7.8*   GFR: Estimated Creatinine Clearance: 46.3 mL/min (by C-G formula based on SCr of 0.67 mg/dL). Liver Function Tests: No results for input(s): AST, ALT, ALKPHOS, BILITOT, PROT, ALBUMIN in the last 168 hours.  No results for input(s): LIPASE, AMYLASE in the last 168 hours. No results for input(s):  AMMONIA in the last 168 hours. Coagulation Profile: No results for input(s): INR, PROTIME in the last 168 hours. Cardiac Enzymes: No results for input(s): CKTOTAL, CKMB, CKMBINDEX, TROPONINI in the last 168 hours. BNP (last 3 results) No results for input(s): PROBNP in the last 8760 hours. HbA1C: No results for input(s): HGBA1C in the last 72 hours. CBG: No results for input(s): GLUCAP in the last 168 hours. Lipid Profile: No results for input(s): CHOL, HDL, LDLCALC, TRIG, CHOLHDL, LDLDIRECT in the last 72 hours. Thyroid  Function Tests: Recent Labs    04/10/24 0859  TSH 3.630   Anemia Panel: No results for input(s): VITAMINB12, FOLATE, FERRITIN, TIBC, IRON, RETICCTPCT in the last 72 hours. Sepsis Labs: No results for input(s): PROCALCITON, LATICACIDVEN in the last 168 hours.   Recent Results (from the past 240 hours)  Culture, blood (routine x 2)     Status:  None   Collection Time: 04/03/24  6:41 PM   Specimen: BLOOD  Result Value Ref Range Status   Specimen Description BLOOD  Final   Special Requests NONE  Final   Culture   Final    NO GROWTH 5 DAYS Performed at [facility], [address], [place], [place] [address]    Report Status 04/08/2024 FINAL  Final  Culture, blood (routine x 2)     Status: None   Collection Time: 04/03/24  6:46 PM   Specimen: BLOOD  Result Value Ref Range Status   Specimen Description BLOOD  Final   Special Requests NONE  Final   Culture   Final    NO GROWTH 5 DAYS Performed at [facility], [address], [place], [place] [address]    Report Status 04/08/2024 FINAL  Final  MRSA Next Gen by PCR, Nasal     Status: None   Collection Time: 04/03/24 10:06 PM   Specimen: Nasal Mucosa; Nasal Swab  Result Value Ref Range Status   MRSA by PCR Next Gen NOT DETECTED NOT DETECTED Final    Comment: (NOTE) The GeneXpert MRSA Assay (FDA approved for NASAL specimens only), is one component of a  comprehensive MRSA colonization surveillance program. It is not intended to diagnose MRSA infection nor to guide or monitor treatment for MRSA infections. Test performance is not FDA approved in patients less than 1 years old. Performed at [facility], [address], [place], [place] [address]          Radiology Studies: DG CHEST PORT 1 VIEW Result Date: 04/10/2024 EXAM: 1 VIEW(S) XRAY OF THE CHEST 04/10/2024 08:55:11 PM COMPARISON: 04/03/2024 CLINICAL HISTORY: Aspiration into airway. FINDINGS: LUNGS AND PLEURA: Low lung volumes. No focal pulmonary opacity. No pleural effusion. No pneumothorax. HEART AND MEDIASTINUM: Aortic atherosclerosis is present. No acute abnormality of the cardiac silhouette. BONES AND SOFT TISSUES: No acute osseous abnormality. Moderate hiatal hernia. Surgical clips in the right chest. IMPRESSION: 1. No acute cardiopulmonary abnormality, without radiographic evidence of aspiration. 2. Moderate hiatal hernia. 3. Low lung volumes. Electronically signed by: Elsie Gravely MD 04/10/2024 08:57 PM EST RP Workstation: [id number]         Scheduled Meds:  ARIPiprazole   5 mg Oral Daily   Chlorhexidine  Gluconate Cloth  6 each Topical Daily   citalopram   20 mg Oral Daily   enoxaparin  (LOVENOX ) injection  40 mg Subcutaneous Q24H   feeding supplement  237 mL Oral BID BM   pantoprazole   40 mg Oral BID   sodium chloride   2 g Oral TID WC   Continuous Infusions:          Calin Fantroy, MD [facility] 04/11/2024, 2:20 PM   "

## 2024-04-11 NOTE — Progress Notes (Signed)
 Physical Therapy Treatment Patient Details Name: Marilyn Haas MRN: [id number] DOB: 1941/01/08 Today's Date: 04/11/2024   History of Present Illness Marilyn Haas is a 84 y.o. 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  She lives at home with her son who travels often.  He has paid helpers to feed and shower her, however the patient has not been eating her meals or cooperating with the helpers regarding taking showers.  The patient's son came home today and noticed urine and feces different places in the house.  The patient was confused.  Reportedly, she may have had a fall.  He called EMS.  No recent use of alcohol per the patient's son at bedside.    PT Comments                 PT dizzy upon sitting.  BP 98/68.  Dizziness did not resolve  but pt is agreeable to going to the chair.  Pt is not agreeable to walking.  (Initially pt was not agreeable to getting out of the bed).        Can travel by private vehicle      yes  Equipment Recommendations   none    Recommendations for Other Services  none     Precautions / Restrictions Precautions Precautions: Fall Recall of Precautions/Restrictions: Intact Restrictions Weight Bearing Restrictions Per Provider Order: No     Mobility  Bed Mobility Overal bed mobility: Needs Assistance Bed Mobility: Supine to Sit     Supine to sit: Min assist          Transfers Overall transfer level: Needs assistance Equipment used: Rolling walker (2 wheels) Transfers: Sit to/from Stand, Bed to chair/wheelchair/BSC Sit to Stand: Min assist   Step pivot transfers: Min assist            Ambulation/Gait Ambulation/Gait  assistance:  (Pt could not be convinced to try to walk stating that she was dizzy)   Assistive device: Rolling walker (2 wheels) Gait Pattern/deviations: Decreased step length - right, Decreased step length - left, Decreased stride length                    Communication Communication Communication: Impaired Factors Affecting Communication: Hearing impaired  Cognition Arousal: Alert Behavior During Therapy: WFL for tasks assessed/performed                             Following commands: Intact      Cueing Cueing Techniques: Verbal cues, Tactile cues, Gestural cues  Exercises General Exercises - Lower Extremity Ankle Circles/Pumps: AROM, Both, Supine, 10 reps Quad Sets: AROM, Both, Supine, 10 reps Long Arc Quad: AROM, Both, 5 reps Heel Slides: AROM, Both, 10 reps Hip ABduction/ADduction: AROM, Both, 10 reps        Pertinent Vitals/Pain Pain Assessment Pain Assessment: No/denies pain       Prior Function            PT Goals (current goals can now be found in the care plan section) Progress towards PT goals: Progressing toward goals    Frequency           PT Plan  SNF  End of Session Equipment Utilized During Treatment: Gait belt Activity Tolerance: Patient limited by fatigue Patient left: in chair;with call bell/phone within reach;with nursing/sitter in room Nurse Communication: Mobility status PT Visit Diagnosis: Unsteadiness on feet (R26.81);Other abnormalities of gait and mobility (R26.89);Muscle weakness (generalized) (M62.81)     Time: 8699-8676 PT Time Calculation (min) (ACUTE ONLY): 23 min  Charges:    $Therapeutic Activity: 23-37 mins PT General Charges $$ ACUTE PT VISIT: 1 Visit                      Montie Metro, PT CLT [phone]  04/11/2024, 1:33 PM

## 2024-04-11 NOTE — Plan of Care (Signed)
  Problem: Education: Goal: Knowledge of General Education information will improve Description: Including pain rating scale, medication(s)/side effects and non-pharmacologic comfort measures Outcome: Progressing   Problem: Health Behavior/Discharge Planning: Goal: Ability to manage health-related needs will improve Outcome: Progressing   Problem: Clinical Measurements: Goal: Ability to maintain clinical measurements within normal limits will improve Outcome: Progressing Goal: Will remain free from infection Outcome: Progressing Goal: Diagnostic test results will improve Outcome: Progressing Goal: Respiratory complications will improve Outcome: Progressing Goal: Cardiovascular complication will be avoided Outcome: Progressing   Problem: Activity: Goal: Risk for activity intolerance will decrease Outcome: Progressing   Problem: Nutrition: Goal: Adequate nutrition will be maintained Outcome: Not Progressing   Problem: Coping: Goal: Level of anxiety will decrease Outcome: Progressing   Problem: Elimination: Goal: Will not experience complications related to bowel motility Outcome: Progressing Goal: Will not experience complications related to urinary retention Outcome: Progressing   Problem: Pain Managment: Goal: General experience of comfort will improve and/or be controlled Outcome: Progressing   Problem: Safety: Goal: Ability to remain free from injury will improve Outcome: Progressing   Problem: Skin Integrity: Goal: Risk for impaired skin integrity will decrease Outcome: Progressing

## 2024-04-12 DIAGNOSIS — C50411 Malignant neoplasm of upper-outer quadrant of right female breast: Secondary | ICD-10-CM

## 2024-04-12 DIAGNOSIS — Z170 Estrogen receptor positive status [ER+]: Secondary | ICD-10-CM

## 2024-04-12 DIAGNOSIS — R296 Repeated falls: Secondary | ICD-10-CM | POA: Diagnosis not present

## 2024-04-12 DIAGNOSIS — F32A Depression, unspecified: Secondary | ICD-10-CM

## 2024-04-12 DIAGNOSIS — F419 Anxiety disorder, unspecified: Secondary | ICD-10-CM

## 2024-04-12 DIAGNOSIS — E876 Hypokalemia: Secondary | ICD-10-CM | POA: Diagnosis not present

## 2024-04-12 DIAGNOSIS — E871 Hypo-osmolality and hyponatremia: Secondary | ICD-10-CM | POA: Diagnosis not present

## 2024-04-12 LAB — COMPREHENSIVE METABOLIC PANEL WITH GFR
ALT: 14 U/L (ref 0–44)
AST: 16 U/L (ref 15–41)
Albumin: 2.7 g/dL — ABNORMAL LOW (ref 3.5–5.0)
Alkaline Phosphatase: 70 U/L (ref 38–126)
Anion gap: 10 (ref 5–15)
BUN: 19 mg/dL (ref 8–23)
CO2: 20 mmol/L — ABNORMAL LOW (ref 22–32)
Calcium: 7.7 mg/dL — ABNORMAL LOW (ref 8.9–10.3)
Chloride: 96 mmol/L — ABNORMAL LOW (ref 98–111)
Creatinine, Ser: 0.69 mg/dL (ref 0.44–1.00)
GFR, Estimated: >60 mL/min
Glucose, Bld: 88 mg/dL (ref 70–99)
Potassium: 4 mmol/L (ref 3.5–5.1)
Sodium: 126 mmol/L — ABNORMAL LOW (ref 135–145)
Total Bilirubin: 0.3 mg/dL (ref 0.0–1.2)
Total Protein: 4.8 g/dL — ABNORMAL LOW (ref 6.5–8.1)

## 2024-04-12 MED ORDER — SODIUM CHLORIDE 0.9 % IV BOLUS
500.0000 mL | Freq: Once | INTRAVENOUS | Status: AC
  Administered 2024-04-12: 500 mL via INTRAVENOUS

## 2024-04-12 NOTE — Progress Notes (Signed)
 " PROGRESS NOTE   Marilyn  RAMONA Haas  FMW:[id number] DOB: 23-Jun-1940 DOA: 04/03/2024 PCP: Orpha Yancey LABOR, MD   Chief Complaint  Patient presents with   Altered Mental Status   Level of care: Stepdown  Brief Admission History:  84 y.o. 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  She lives at home with her son who travels often.  He has paid helpers to feed and shower her, however the patient has not been eating her meals or cooperating with the helpers regarding taking showers.  The patient's son came home and noticed urine and feces different places in the house.  The patient was confused.  Reportedly, she may have had a fall.  He called EMS.  Patient has been drinking alcohol(mainly beer) for a long time   In the ER, alert and confused.  No diagnosis of dementia per the son at bedside.  Noncontrast head CT showed no acute intracranial abnormality.  It showed chronic microvascular ischemic change and mild diffuse cerebral volume loss.  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  Air-fluid level in the right sphenoid sinus which can be seen with acute sinusitis.  Serum sodium 113 from baseline of 127.  UA was negative for pyuria. Vital signs stable in the ER   Patient admitted for hypotonic hyponatremia.  Concern for beer potomania.  Sodium initially improved to 120 from 113, rather quickly than expected.  Received some dextrose , sodium level again trended down.  Now very slowly improving with salt tablets.  Sodium 123.  Baseline appears to be between 125-130   Assessment and Plan:  Chronic hyponatremia Baseline serum sodium between 125-130. Presented with  serum sodium level of 113--> improved rapidly to 120 after normal saline bolus--> received dextrose  briefly and sodium down to 115.  This has started to improve with salt tablets.  Latest sodium 119.  Will continue his salt tablets 2 g 3 times a day urine osmolality 160, urine sodium less than 30.?Beer potomania, poor intake of food Will restrict fluids Cortisol, TSH is normal Improved to 126    Acute metabolic encephalopathy secondary to the above--RESOLVED  Now at baseline Noncontrast head CT was nonacute, no evidence of intracranial bleed Reorient as needed Continue to treat underlying conditions Fall precautions.   Mild non-anion gap acidosis. Improving.  Creatinine is stable     elevated liver chemistries AST 58, ALT 57 on admission. Recheck in the am   Breast cancer on tamoxifen  Resume home regimen Follow-up with oncology outpatient.   History of stroke seen on MRI brain 12/07/2022 Currently not on antiplatelets or statin Monitor on telemetry.   Generalized weakness with possible fall PT OT evaluation Fall precaution. TOC on board for SNF   Chronic back pain: Norco as needed.   Palliative care consulted to discuss goals of care.  Plan is to short-term rehab.  DNR/DNI.  Palliative follow-up outpatient.  DVT prophylaxis:  enoxaparin  Code Status: Full  Family Communication:  Disposition: SNF    Consultants:  cardio Procedures:   Antimicrobials:    Subjective: Pt denies CP and palpitations.    Objective: Vitals:   04/12/24 1000 04/12/24 1100 04/12/24 1200 04/12/24 1300  BP: (!) 119/42 (!) 105/36 ROLLEN)  111/57   Pulse: 87 79 90 97  Resp: 14 13 17  (!) 23  Temp:      TempSrc:      SpO2: 100% 99% 99% 100%  Weight:      Height:        Intake/Output Summary (Last 24 hours) at 04/12/2024 1456 Last data filed at 04/12/2024 1200 Gross per 24 hour  Intake 840.21 ml  Output 600 ml  Net 240.21 ml   Filed Weights   04/03/24 1839 04/03/24 2248 04/10/24 0457   Weight: 59.8 kg 55.4 kg 59 kg   Examination:  General exam: Appears calm and comfortable  Respiratory system: Clear to auscultation. Respiratory effort normal. Cardiovascular system: normal S1 & S2 heard. No JVD, murmurs, rubs, gallops or clicks. No pedal edema. Gastrointestinal system: Abdomen is nondistended, soft and nontender. No organomegaly or masses felt. Normal bowel sounds heard. Central nervous system: Alert and oriented. No focal neurological deficits. Extremities: Symmetric 5 x 5 power. Skin: No rashes, lesions or ulcers. Psychiatry: Judgement and insight appear normal. Mood & affect appropriate.   Data Reviewed: I have personally reviewed following labs and imaging studies  CBC: No results for input(s): WBC, NEUTROABS, HGB, HCT, MCV, PLT in the last 168 hours.  Basic Metabolic Panel: Recent Labs  Lab 04/09/24 1910 04/10/24 0337 04/10/24 0859 04/10/24 2011 04/11/24 0813 04/12/24 0452  NA 119*  --  124* 123* 122* 126*  K 3.8  --  3.9 3.7 3.6 4.0  CL 89*  --  90* 92* 91* 96*  CO2 20*  --  25 20* 21* 20*  GLUCOSE 139*  --  92 114* 94 88  BUN 13  --  14 20 17 19   CREATININE 0.74 0.66 0.69 0.70 0.67 0.69  CALCIUM 7.4*  --  7.7* 7.5* 7.8* 7.7*    CBG: No results for input(s): GLUCAP in the last 168 hours.  Recent Results (from the past 240 hours)  Culture, blood (routine x 2)     Status: None   Collection Time: 04/03/24  6:41 PM   Specimen: BLOOD  Result Value Ref Range Status   Specimen Description BLOOD  Final   Special Requests NONE  Final   Culture   Final    NO GROWTH 5 DAYS Performed at [facility], [address], [place], [place] [address]    Report Status 04/08/2024 FINAL  Final  Culture, blood (routine x 2)     Status: None   Collection Time: 04/03/24  6:46 PM   Specimen: BLOOD  Result Value Ref Range Status   Specimen Description BLOOD  Final   Special Requests NONE  Final   Culture   Final    NO GROWTH 5 DAYS Performed at  [facility], [address], [place], [place] [address]    Report Status 04/08/2024 FINAL  Final  MRSA Next Gen by PCR, Nasal     Status: None   Collection Time: 04/03/24 10:06 PM   Specimen: Nasal Mucosa; Nasal Swab  Result Value Ref Range Status   MRSA by PCR Next Gen NOT DETECTED NOT DETECTED Final    Comment: (NOTE) The GeneXpert MRSA Assay (FDA approved for NASAL specimens only), is one component of a comprehensive MRSA colonization surveillance program. It is not intended to diagnose MRSA infection nor to guide or monitor treatment for MRSA infections. Test performance is not FDA approved in patients less than 20 years old. Performed at [facility], [address], [place], [place] [address]  Radiology Studies: DG CHEST PORT 1 VIEW Result Date: 04/10/2024 EXAM: 1 VIEW(S) XRAY OF THE CHEST 04/10/2024 08:55:11 PM COMPARISON: 04/03/2024 CLINICAL HISTORY: Aspiration into airway. FINDINGS: LUNGS AND PLEURA: Low lung volumes. No focal pulmonary opacity. No pleural effusion. No pneumothorax. HEART AND MEDIASTINUM: Aortic atherosclerosis is present. No acute abnormality of the cardiac silhouette. BONES AND SOFT TISSUES: No acute osseous abnormality. Moderate hiatal hernia. Surgical clips in the right chest. IMPRESSION: 1. No acute cardiopulmonary abnormality, without radiographic evidence of aspiration. 2. Moderate hiatal hernia. 3. Low lung volumes. Electronically signed by: Elsie Gravely MD 04/10/2024 08:57 PM EST RP Workstation: [id number]   Scheduled Meds:  ARIPiprazole   5 mg Oral Daily   Chlorhexidine  Gluconate Cloth  6 each Topical Daily   citalopram   20 mg Oral Daily   enoxaparin  (LOVENOX ) injection  40 mg Subcutaneous Q24H   feeding supplement  237 mL Oral BID BM   pantoprazole   40 mg Oral BID   sodium chloride   2 g Oral TID WC   tamoxifen   10 mg Oral QODAY   Continuous Infusions:   LOS: 9 days   Time spent: 57 mins  Veronique Warga Vicci, MD How to contact the [facility]  Attending or Consulting provider 7A - 7P or covering provider during after hours 7P -7A, for this patient?  Check the care team in [facility] and look for a) attending/consulting TRH provider listed and b) the TRH team listed Log into [email or web address] to find provider on call.  Locate the TRH provider you are looking for under [facility] and page to a number that you can be directly reached. If you still have difficulty reaching the provider, please page the [facility] (Director on Call) for the Hospitalists listed on amion for assistance.  04/12/2024, 2:56 PM    "

## 2024-04-12 NOTE — Plan of Care (Signed)

## 2024-04-12 NOTE — Hospital Course (Signed)
 84 y.o. 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  She lives at home with her son who travels often.  He has paid helpers to feed and shower her, however the patient has not been eating her meals or cooperating with the helpers regarding taking showers.  The patient's son came home and noticed urine and feces different places in the house.  The patient was confused.  Reportedly, she may have had a fall.  He called EMS.  Patient has been drinking alcohol(mainly beer) for a long time   In the ER, alert and confused.  No diagnosis of dementia per the son at bedside.  Noncontrast head CT showed no acute intracranial abnormality.  It showed chronic microvascular ischemic change and mild diffuse cerebral volume loss.  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  Air-fluid level in the right sphenoid sinus which can be seen with acute sinusitis.  Serum sodium 113 from baseline of 127.  UA was negative for pyuria. Vital signs stable in the ER   Patient admitted for hypotonic hyponatremia.  Concern for beer potomania.  Sodium initially improved to 120 from 113, rather quickly than expected.  Received some dextrose , sodium level again trended down.  Now very slowly improving with salt tablets.  Sodium 123.  Baseline appears to be between 125-130

## 2024-04-13 DIAGNOSIS — C50411 Malignant neoplasm of upper-outer quadrant of right female breast: Secondary | ICD-10-CM | POA: Diagnosis not present

## 2024-04-13 DIAGNOSIS — F419 Anxiety disorder, unspecified: Secondary | ICD-10-CM | POA: Diagnosis not present

## 2024-04-13 DIAGNOSIS — F32A Depression, unspecified: Secondary | ICD-10-CM | POA: Diagnosis not present

## 2024-04-13 DIAGNOSIS — R296 Repeated falls: Secondary | ICD-10-CM | POA: Diagnosis not present

## 2024-04-13 DIAGNOSIS — E876 Hypokalemia: Secondary | ICD-10-CM | POA: Diagnosis not present

## 2024-04-13 DIAGNOSIS — E871 Hypo-osmolality and hyponatremia: Secondary | ICD-10-CM | POA: Diagnosis not present

## 2024-04-13 DIAGNOSIS — Z170 Estrogen receptor positive status [ER+]: Secondary | ICD-10-CM | POA: Diagnosis not present

## 2024-04-13 LAB — BASIC METABOLIC PANEL WITH GFR
Anion gap: 8 (ref 5–15)
BUN: 19 mg/dL (ref 8–23)
CO2: 24 mmol/L (ref 22–32)
Calcium: 7.9 mg/dL — ABNORMAL LOW (ref 8.9–10.3)
Chloride: 98 mmol/L (ref 98–111)
Creatinine, Ser: 0.76 mg/dL (ref 0.44–1.00)
GFR, Estimated: >60 mL/min
Glucose, Bld: 98 mg/dL (ref 70–99)
Potassium: 4 mmol/L (ref 3.5–5.1)
Sodium: 129 mmol/L — ABNORMAL LOW (ref 135–145)

## 2024-04-13 MED ORDER — POLYETHYLENE GLYCOL 3350 17 G PO PACK: 17.0000 g | PACK | Freq: Every day | ORAL | Status: AC | PRN

## 2024-04-13 MED ORDER — HYDROCODONE-ACETAMINOPHEN 7.5-325 MG PO TABS: 1.0000 | ORAL_TABLET | Freq: Three times a day (TID) | ORAL | 0 refills | Status: DC | PRN

## 2024-04-13 MED ORDER — ACETAMINOPHEN 325 MG PO TABS: 325.0000 mg | ORAL_TABLET | Freq: Four times a day (QID) | ORAL | Status: AC | PRN

## 2024-04-13 MED ORDER — MELATONIN 3 MG PO TABS: 6.0000 mg | ORAL_TABLET | Freq: Every evening | ORAL | Status: AC | PRN

## 2024-04-13 MED ORDER — SODIUM CHLORIDE 1 G PO TABS: 1.0000 g | ORAL_TABLET | Freq: Two times a day (BID) | ORAL | Status: AC

## 2024-04-13 MED ORDER — IPRATROPIUM-ALBUTEROL 0.5-2.5 (3) MG/3ML IN SOLN: 3.0000 mL | Freq: Four times a day (QID) | RESPIRATORY_TRACT | Status: AC | PRN

## 2024-04-13 MED ORDER — GUAIFENESIN 100 MG/5ML PO LIQD: 5.0000 mL | ORAL | Status: AC | PRN

## 2024-04-13 MED ORDER — ENSURE PLUS HIGH PROTEIN PO LIQD: 237.0000 mL | Freq: Two times a day (BID) | ORAL | Status: AC

## 2024-04-13 NOTE — TOC Transition Note (Signed)
 Transition of Care [facility]) - Discharge Note   Patient Details  Name: Marilyn  [name] MRN: [id number] Date of Birth: [birth year]  Transition of Care [facility]) CM/SW Contact:  Lucie Lunger, LCSWA Phone Number: 04/13/2024, 3:17 PM  Clinical Narrative:    CSW updated that pt is medically stable for D/C to [facility] today. CSW spoke with pts son Sherida to provide update on plan for D/C. CSW also spoke with Sherida about OP palliative referral, [facility] is choice, referral made to [facility] with [facility]. Room and report sent to RN. EMS called for transport. TOC signing off.   Final next level of care: Skilled Nursing Facility Barriers to Discharge: Barriers Resolved   Patient Goals and CMS Choice Patient states their goals for this hospitalization and ongoing recovery are:: go to SNF CMS Medicare.gov Compare Post Acute Care list provided to:: Patient Represenative (must comment) Choice offered to / list presented to : Adult Children      Discharge Placement                Patient to be transferred to facility by: EMS Name of family member notified: son Sherida Patient and family notified of of transfer: 04/13/24  Discharge Plan and Services Additional resources added to the After Visit Summary for   In-house Referral: Clinical Social Work   Post Acute Care Choice: Durable Medical Equipment                               Social Drivers of Health (SDOH) Interventions SDOH Screenings   Food Insecurity: Patient Unable To Answer (04/04/2024)  Housing: Patient Unable To Answer (04/04/2024)  Transportation Needs: Patient Unable To Answer (04/04/2024)  Utilities: Patient Unable To Answer (04/04/2024)  Alcohol Screen: Low Risk (03/23/2022)  Depression (PHQ2-9): Medium Risk (03/23/2022)  Financial Resource Strain: Low Risk (10/26/2022)  Physical Activity: Inactive (03/23/2022)  Social Connections: Patient Unable To Answer (04/04/2024)  Stress: No Stress Concern Present (03/23/2022)  Tobacco  Use: Medium Risk (04/06/2024)     Readmission Risk Interventions    04/04/2024   11:53 AM 12/07/2022   10:54 AM  Readmission Risk Prevention Plan  Transportation Screening Complete Complete  PCP or Specialist Appt within 3-5 Days  Complete  Home Care Screening Complete   Medication Review (RN CM) Complete   HRI or Home Care Consult  Complete  Social Work Consult for Recovery Care Planning/Counseling  Complete  Palliative Care Screening  Not Applicable  Medication Review Oceanographer)  Not Complete  Med Review Comments  Will be reviewed by charge nurse upon discharge

## 2024-04-13 NOTE — Care Management Important Message (Signed)
 Important Message  Patient Details  Name: Marilyn Haas MRN: [id number] Date of Birth: 1940-09-12   Important Message Given:  Yes - Medicare IM     Marilyn Haas 04/13/2024, 2:22 PM

## 2024-04-13 NOTE — Discharge Instructions (Signed)
 IMPORTANT INFORMATION: PAY CLOSE ATTENTION  ? ?PHYSICIAN DISCHARGE INSTRUCTIONS ? ?Follow with Primary care provider  Marilyn Deiters, MD  and other consultants as instructed by your Hospitalist Physician ? ?SEEK MEDICAL CARE OR RETURN TO EMERGENCY ROOM IF SYMPTOMS COME BACK, WORSEN OR NEW PROBLEM DEVELOPS  ? ?Please note: ?You were cared for by a hospitalist during your hospital stay. Every effort will be made to forward records to your primary care provider.  You can request that your primary care provider send for your hospital records if they have not received them.  Once you are discharged, your primary care physician will handle any further medical issues. Please note that NO REFILLS for any discharge medications will be authorized once you are discharged, as it is imperative that you return to your primary care physician (or establish a relationship with a primary care physician if you do not have one) for your post hospital discharge needs so that they can reassess your need for medications and monitor your lab values. ? ?Please get a complete blood count and chemistry panel checked by your Primary MD at your next visit, and again as instructed by your Primary MD. ? ?Get Medicines reviewed and adjusted: ?Please take all your medications with you for your next visit with your Primary MD ? ?Laboratory/radiological data: ?Please request your Primary MD to go over all hospital tests and procedure/radiological results at the follow up, please ask your primary care provider to get all Hospital records sent to his/her office. ? ?In some cases, they will be blood work, cultures and biopsy results pending at the time of your discharge. Please request that your primary care provider follow up on these results. ? ?If you are diabetic, please bring your blood sugar readings with you to your follow up appointment with primary care.   ? ?Please call and make your follow up appointments as soon as possible.   ? ?Also Note  the following: ?If you experience worsening of your admission symptoms, develop shortness of breath, life threatening emergency, suicidal or homicidal thoughts you must seek medical attention immediately by calling 911 or calling your MD immediately  if symptoms less severe. ? ?You must read complete instructions/literature along with all the possible adverse reactions/side effects for all the Medicines you take and that have been prescribed to you. Take any new Medicines after you have completely understood and accpet all the possible adverse reactions/side effects.  ? ?Do not drive when taking Pain medications or sleeping medications (Benzodiazepines) ? ?Do not take more than prescribed Pain, Sleep and Anxiety Medications. It is not advisable to combine anxiety,sleep and pain medications without talking with your primary care practitioner ? ?Special Instructions: If you have smoked or chewed Tobacco  in the last 2 yrs please stop smoking, stop any regular Alcohol  and or any Recreational drug use. ? ?Wear Seat belts while driving.  Do not drive if taking any narcotic, mind altering or controlled substances or recreational drugs or alcohol.  ? ? ? ? ? ?

## 2024-04-13 NOTE — Discharge Summary (Signed)
 Physician Discharge Summary  Marilyn Haas FMW:[id number] DOB: [birth year] DOA: 04/03/2024  PCP: Orpha Yancey LABOR, MD  Admit date: 04/03/2024 Discharge date: 04/13/2024  Admitted From:  HOME  Disposition:  SNF  Recommendations for Outpatient Follow-up:  Follow up with PCP in 2 weeks Please obtain BMP in 1 week and weekly to monitor sodium levels  Please check liver enzymes in 1 week to follow up Please order outpatient palliative medicine consultation Please follow up with oncology as scheduled    Home Health:  SNF   Discharge Condition: STABLE   CODE STATUS: DNR DIET:  dysphagia 2   Brief Hospitalization Summary: Please see all hospital notes, images, labs for full details of the hospitalization. Admission provider HPI:  84 y.o. 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  She lives at home with her son who travels often.  He has paid helpers to feed and shower her, however the patient has not been eating her meals or cooperating with the helpers regarding taking showers.  The patient's son came home and noticed urine and feces different places in the house.  The patient was confused.  Reportedly, she may have had a fall.  He called EMS.  Patient has been drinking alcohol(mainly beer) for a long time   In the ER, alert and confused.  No diagnosis of dementia per the son at bedside.  Noncontrast head CT showed no acute intracranial abnormality.  It showed chronic microvascular ischemic change and mild diffuse cerebral volume loss.  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  Air-fluid level in the right sphenoid sinus which can be seen with acute  sinusitis.  Serum sodium 113 from baseline of 127.  UA was negative for pyuria. Vital signs stable in the ER   Patient admitted for hypotonic hyponatremia.  Concern for beer potomania.  Sodium initially improved to 120 from 113, rather quickly than expected.  Received some dextrose , sodium level again trended down.  Now very slowly improving with salt tablets.  Sodium 123.  Baseline appears to be between [facility] course by listed problems addressed  Chronic hyponatremia Baseline serum sodium between 125-130. Presented with serum sodium level of 113--> improved rapidly to 120 after normal saline bolus--> received dextrose  briefly and sodium down to 115.  This has started to improve with salt tablets.  Latest sodium 119.  Will continue his salt tablets 2 g 3 times a day urine osmolality 160, urine sodium less than 30.?Beer potomania, poor intake of food Will restrict fluids Cortisol, TSH is normal Improved to 129 with treatments Pt is feeling a lot better and tolerating diet well.     Acute metabolic encephalopathy secondary to the above--RESOLVED  Now at baseline Noncontrast head CT was nonacute, no evidence of intracranial bleed Reorient as needed Continue to treat underlying conditions Fall precautions.   Mild non-anion gap acidosis. Improving.  Creatinine is stable     elevated liver chemistries AST 58, ALT 57 on admission. Recheck in 1 week    Breast cancer on tamoxifen  Resume home regimen Follow-up with oncology outpatient.   History of stroke seen on MRI brain 12/07/2022 Currently not on antiplatelets or statin Monitor on telemetry.  Generalized weakness with possible fall PT OT evaluation Fall precaution. TOC on board for SNF   Chronic back pain: Norco as needed.   Palliative care consulted to discuss goals of care.  Plan is to short-term rehab.  DNR/DNI.  Palliative follow-up outpatient.    Discharge Diagnoses:  Principal Problem:    Hyponatremia Active Problems:   Malignant neoplasm of upper-outer quadrant of right breast in female, estrogen receptor positive (HCC)   Frequent falls   Hypokalemia   Anxiety and depression   Discharge Instructions:  Allergies as of 04/13/2024       Reactions   Avelox [moxifloxacin Hcl In Nacl] Other (See Comments)   Altered mental status        Medication List     STOP taking these medications    losartan  25 MG tablet Commonly known as: COZAAR        TAKE these medications    acetaminophen  325 MG tablet Commonly known as: TYLENOL  Take 1 tablet (325 mg total) by mouth every 6 (six) hours as needed. What changed: how much to take   ARIPiprazole  5 MG tablet Commonly known as: ABILIFY  Take 5 mg by mouth daily.   citalopram  20 MG tablet Commonly known as: CELEXA  Take 20 mg by mouth daily.   feeding supplement Liqd Take 237 mLs by mouth 2 (two) times daily between meals.   guaiFENesin  100 MG/5ML liquid Commonly known as: ROBITUSSIN Take 5 mLs by mouth every 4 (four) hours as needed for cough or to loosen phlegm.   HYDROcodone -acetaminophen  7.5-325 MG tablet Commonly known as: NORCO Take 1 tablet by mouth every 8 (eight) hours as needed for severe pain (pain score 7-10). What changed: reasons to take this   ipratropium-albuterol  0.5-2.5 (3) MG/3ML Soln Commonly known as: DUONEB Take 3 mLs by nebulization every 6 (six) hours as needed.   melatonin 3 MG Tabs tablet Take 2 tablets (6 mg total) by mouth at bedtime as needed (sleep).   pantoprazole  40 MG tablet Commonly known as: PROTONIX  TAKE ONE TABLET (40MG  TOTAL) BY MOUTH TWO TIMES DAILY   polyethylene glycol 17 g packet Commonly known as: MIRALAX  / GLYCOLAX  Take 17 g by mouth daily as needed for mild constipation.   sodium chloride  1 g tablet Take 1 tablet (1 g total) by mouth 2 (two) times daily with a meal.   tamoxifen  10 MG tablet Commonly known as: NOLVADEX  TAKE ONE TABLET (10MG  TOTAL) BY  MOUTH EVERY OTHER DAY   venlafaxine  37.5 MG tablet Commonly known as: EFFEXOR  Take 37.5 mg by mouth 2 (two) times daily.        Contact information for follow-up providers     Orpha Yancey LABOR, MD. Schedule an appointment as soon as possible for a visit in 2 week(s).   Specialty: Internal Medicine Why: Hospital Follow Up Contact information: [address] [place] [address] [phone]              Contact information for after-discharge care     Destination     [facility] .   Service: Skilled Nursing Contact information: [address] [place]  [address] [phone]                    Allergies[1] Allergies as of 04/13/2024       Reactions   Avelox [moxifloxacin Hcl In Nacl] Other (See Comments)   Altered mental status        Medication List  STOP taking these medications    losartan  25 MG tablet Commonly known as: COZAAR        TAKE these medications    acetaminophen  325 MG tablet Commonly known as: TYLENOL  Take 1 tablet (325 mg total) by mouth every 6 (six) hours as needed. What changed: how much to take   ARIPiprazole  5 MG tablet Commonly known as: ABILIFY  Take 5 mg by mouth daily.   citalopram  20 MG tablet Commonly known as: CELEXA  Take 20 mg by mouth daily.   feeding supplement Liqd Take 237 mLs by mouth 2 (two) times daily between meals.   guaiFENesin  100 MG/5ML liquid Commonly known as: ROBITUSSIN Take 5 mLs by mouth every 4 (four) hours as needed for cough or to loosen phlegm.   HYDROcodone -acetaminophen  7.5-325 MG tablet Commonly known as: NORCO Take 1 tablet by mouth every 8 (eight) hours as needed for severe pain (pain score 7-10). What changed: reasons to take this   ipratropium-albuterol  0.5-2.5 (3) MG/3ML Soln Commonly known as: DUONEB Take 3 mLs by nebulization every 6 (six) hours as needed.   melatonin 3 MG Tabs tablet Take 2 tablets (6  mg total) by mouth at bedtime as needed (sleep).   pantoprazole  40 MG tablet Commonly known as: PROTONIX  TAKE ONE TABLET (40MG  TOTAL) BY MOUTH TWO TIMES DAILY   polyethylene glycol 17 g packet Commonly known as: MIRALAX  / GLYCOLAX  Take 17 g by mouth daily as needed for mild constipation.   sodium chloride  1 g tablet Take 1 tablet (1 g total) by mouth 2 (two) times daily with a meal.   tamoxifen  10 MG tablet Commonly known as: NOLVADEX  TAKE ONE TABLET (10MG  TOTAL) BY MOUTH EVERY OTHER DAY   venlafaxine  37.5 MG tablet Commonly known as: EFFEXOR  Take 37.5 mg by mouth 2 (two) times daily.        Procedures/Studies: DG CHEST PORT 1 VIEW Result Date: 04/10/2024 EXAM: 1 VIEW(S) XRAY OF THE CHEST 04/10/2024 08:55:11 PM COMPARISON: 04/03/2024 CLINICAL HISTORY: Aspiration into airway. FINDINGS: LUNGS AND PLEURA: Low lung volumes. No focal pulmonary opacity. No pleural effusion. No pneumothorax. HEART AND MEDIASTINUM: Aortic atherosclerosis is present. No acute abnormality of the cardiac silhouette. BONES AND SOFT TISSUES: No acute osseous abnormality. Moderate hiatal hernia. Surgical clips in the right chest. IMPRESSION: 1. No acute cardiopulmonary abnormality, without radiographic evidence of aspiration. 2. Moderate hiatal hernia. 3. Low lung volumes. Electronically signed by: Elsie Gravely MD 04/10/2024 08:57 PM EST RP Workstation: [id number]   CT Head Wo Contrast Result Date: 04/03/2024 EXAM: CT HEAD WITHOUT CONTRAST 04/03/2024 08:08:19 PM TECHNIQUE: CT of the head was performed without the administration of intravenous contrast. Automated exposure control, iterative reconstruction, and/or weight based adjustment of the mA/kV was utilized to reduce the radiation dose to as low as reasonably achievable. COMPARISON: MRI head 12/07/2022. CLINICAL HISTORY: Mental status change, unknown cause. FINDINGS: BRAIN AND VENTRICLES: No acute hemorrhage. No evidence of acute infarct. No hydrocephalus. No  extra-axial collection. No mass effect or midline shift. Periventricular and subcortical white matter scattered low-density changes compatible with chronic microvascular ischemic change. Mild diffuse cerebral volume loss. The CT appearance is consistent with the remote lacunar infarct in the right paramedian pons noted on the prior MRI. ORBITS: No acute abnormality. SINUSES: Air-fluid level in right sphenoid sinus. SOFT TISSUES AND SKULL: No acute soft tissue abnormality. No skull fracture. Atherosclerotic calcifications in skull base large vessels. IMPRESSION: 1. No acute intracranial abnormality. 2. Chronic microvascular ischemic change and mild diffuse cerebral volume loss. 3.  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 4. Air-fluid level in the right sphenoid sinus, which can be seen with acute sinusitis. Electronically signed by: Donnice Mania MD 04/03/2024 08:23 PM EST RP Workstation: [id number]   DG Chest Port 1 View Result Date: 04/03/2024 EXAM: 1 VIEW(S) XRAY OF THE CHEST 04/03/2024 07:01:30 PM COMPARISON: 12/07/2022 CLINICAL HISTORY: AMS (altered mental status) FINDINGS: LUNGS AND PLEURA: No focal pulmonary opacity. No pleural effusion. No pneumothorax. HEART AND MEDIASTINUM: Moderate hiatal hernia. No acute abnormality of the cardiac silhouette. BONES AND SOFT TISSUES: No acute osseous abnormality. IMPRESSION: 1. No acute findings. Electronically signed by: Pinkie Pebbles MD 04/03/2024 07:05 PM EST RP Workstation: [id number]     Subjective: Pt reporting no specific complaints.    Discharge Exam: Vitals:   04/13/24 0800 04/13/24 0804  BP: (!) 96/43   Pulse: 71   Resp: 10   Temp:  98.4 F (36.9 C)  SpO2: 100%    Vitals:   04/13/24 0600 04/13/24 0700 04/13/24 0800 04/13/24 0804  BP: (!) 97/44 (!) 106/54 (!) 96/43   Pulse: 84 73 71   Resp: 14 10 10    Temp:    98.4 F (36.9 C)  TempSrc:    Oral  SpO2: 100% 100% 100%   Weight:      Height:        General: Pt is alert, awake, not in acute distress Cardiovascular: normal S1/S2 +, no rubs, no gallops Respiratory: CTA bilaterally, no wheezing, no rhonchi Abdominal: Soft, NT, ND, bowel sounds + Extremities: no edema, no cyanosis   The results of significant diagnostics from this hospitalization (including imaging, microbiology, ancillary and laboratory) are listed below for reference.     Microbiology: Recent Results (from the past 240 hours)  Culture, blood (routine x 2)     Status: None   Collection Time: 04/03/24  6:41 PM   Specimen: BLOOD  Result Value Ref Range Status   Specimen Description BLOOD  Final   Special Requests NONE  Final   Culture   Final    NO GROWTH 5 DAYS Performed at [facility], [address] [place], [place] [address]    Report Status 04/08/2024 FINAL  Final  Culture, blood (routine x 2)     Status: None   Collection Time: 04/03/24  6:46 PM   Specimen: BLOOD  Result Value Ref Range Status   Specimen Description BLOOD  Final   Special Requests NONE  Final   Culture   Final    NO GROWTH 5 DAYS Performed at [facility], [address] [place], [place] [address]    Report Status 04/08/2024 FINAL  Final  MRSA Next Gen by PCR, Nasal     Status: None   Collection Time: 04/03/24 10:06 PM   Specimen: Nasal Mucosa; Nasal Swab  Result Value Ref Range Status   MRSA by PCR Next Gen NOT DETECTED NOT DETECTED Final    Comment: (NOTE) The GeneXpert MRSA Assay (FDA approved for NASAL specimens only), is one component of a comprehensive MRSA colonization surveillance program. It is not intended to diagnose MRSA infection nor to guide or monitor treatment for MRSA infections. Test performance is not FDA approved in patients less than 97 years old. Performed at [facility], [address] [place], [place] [address]      Labs: BNP (last 3 results) No results for input(s): BNP in the last 8760 hours. Basic Metabolic Panel: Recent Labs  Lab  04/10/24 [phone]  04/10/24 2011 04/11/24 0813 04/12/24 0452 04/13/24 0431  NA 124* 123* 122* 126* 129*  K 3.9 3.7 3.6 4.0 4.0  CL 90* 92* 91* 96* 98  CO2 25 20* 21* 20* 24  GLUCOSE 92 114* 94 88 98  BUN 14 20 17 19 19   CREATININE 0.69 0.70 0.67 0.69 0.76  CALCIUM 7.7* 7.5* 7.8* 7.7* 7.9*   Liver Function Tests: Recent Labs  Lab 04/12/24 0452  AST 16  ALT 14  ALKPHOS 70  BILITOT 0.3  PROT 4.8*  ALBUMIN 2.7*   No results for input(s): LIPASE, AMYLASE in the last 168 hours. No results for input(s): AMMONIA in the last 168 hours. CBC: No results for input(s): WBC, NEUTROABS, HGB, HCT, MCV, PLT in the last 168 hours. Cardiac Enzymes: No results for input(s): CKTOTAL, CKMB, CKMBINDEX, TROPONINI in the last 168 hours. BNP: Invalid input(s): POCBNP CBG: No results for input(s): GLUCAP in the last 168 hours. D-Dimer No results for input(s): DDIMER in the last 72 hours. Hgb A1c No results for input(s): HGBA1C in the last 72 hours. Lipid Profile No results for input(s): CHOL, HDL, LDLCALC, TRIG, CHOLHDL, LDLDIRECT in the last 72 hours. Thyroid  function studies No results for input(s): TSH, T4TOTAL, T3FREE, THYROIDAB in the last 72 hours.  Invalid input(s): FREET3 Anemia work up No results for input(s): VITAMINB12, FOLATE, FERRITIN, TIBC, IRON, RETICCTPCT in the last 72 hours. Urinalysis    Component Value Date/Time   COLORURINE YELLOW 04/03/2024 1924   APPEARANCEUR CLEAR 04/03/2024 1924   LABSPEC 1.005 04/03/2024 1924   PHURINE 6.0 04/03/2024 1924   GLUCOSEU NEGATIVE 04/03/2024 1924   HGBUR SMALL (A) 04/03/2024 1924   BILIRUBINUR NEGATIVE 04/03/2024 1924   BILIRUBINUR negative 10/26/2022 0841   KETONESUR 5 (A) 04/03/2024 1924   PROTEINUR NEGATIVE 04/03/2024 1924   UROBILINOGEN 0.2 10/26/2022 0841   UROBILINOGEN 1.0 06/26/2014 1400   NITRITE NEGATIVE 04/03/2024 1924   LEUKOCYTESUR NEGATIVE 04/03/2024  1924   Sepsis Labs No results for input(s): WBC in the last 168 hours.  Invalid input(s): PROCALCITONIN, LACTICIDVEN Microbiology Recent Results (from the past 240 hours)  Culture, blood (routine x 2)     Status: None   Collection Time: 04/03/24  6:41 PM   Specimen: BLOOD  Result Value Ref Range Status   Specimen Description BLOOD  Final   Special Requests NONE  Final   Culture   Final    NO GROWTH 5 DAYS Performed at [facility], [address] [place], [place] [address]    Report Status 04/08/2024 FINAL  Final  Culture, blood (routine x 2)     Status: None   Collection Time: 04/03/24  6:46 PM   Specimen: BLOOD  Result Value Ref Range Status   Specimen Description BLOOD  Final   Special Requests NONE  Final   Culture   Final    NO GROWTH 5 DAYS Performed at [facility], [address] [place], [place] [address]    Report Status 04/08/2024 FINAL  Final  MRSA Next Gen by PCR, Nasal     Status: None   Collection Time: 04/03/24 10:06 PM   Specimen: Nasal Mucosa; Nasal Swab  Result Value Ref Range Status   MRSA by PCR Next Gen NOT DETECTED NOT DETECTED Final    Comment: (NOTE) The GeneXpert MRSA Assay (FDA approved for NASAL specimens only), is one component of a comprehensive MRSA colonization surveillance program. It is not intended to diagnose MRSA infection nor to guide or monitor treatment for  MRSA infections. Test performance is not FDA approved in patients less than 32 years old. Performed at [facility], [address] [place], [place] [address]    Time coordinating discharge:  35 mins   SIGNED:  Afton Louder, MD  [facility] 04/13/2024, 10:50 AM How to contact the [facility] Attending or Consulting provider 7A - 7P or covering provider during after hours 7P -7A, for this patient?  Check the care team in [facility] and look for a) attending/consulting [facility] provider listed and b) the [facility] team listed Log into [email or web address] and use [place]'s universal  password to access. If you do not have the password, please contact the hospital operator. Locate the [facility] provider you are looking for under [facility] and page to a number that you can be directly reached. If you still have difficulty reaching the provider, please page the [facility] (Director on Call) for the Hospitalists listed on amion for assistance.     [1]  Allergies Allergen Reactions   Avelox [Moxifloxacin Hcl In Nacl] Other (See Comments)    Altered mental status

## 2024-04-13 NOTE — Care Management Important Message (Signed)
 Important Message  Patient Details  Name: Marilyn Haas MRN: [id number] Date of Birth: Jun 19, 1940   Important Message Given:  Yes - Medicare IM     [name] [name] 04/13/2024, 2:01 PM

## 2024-04-13 NOTE — Progress Notes (Signed)
 Occupational Therapy Treatment Patient Details Name: Marilyn Haas MRN: [id number] DOB: November 07, 1940 Today's Date: 04/13/2024   History of present illness Marilyn Haas is a 84 y.o. 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  She lives at home with her son who travels often.  He has paid helpers to feed and shower her, however the patient has not been eating her meals or cooperating with the helpers regarding taking showers.  The patient's son came home today and noticed urine and feces different places in the house.  The patient was confused.  Reportedly, she may have had a fall.  He called EMS.  No recent use of alcohol per the patient's son at bedside.   OT comments  This session pt started in supine completing A/ROM in all motions. She reported no pain and mild fatigue. Pt then agreeable to sit EOB, where dried stool was found. OT provided mod assist to transfer to [facility] and max assist +2 for standing LB bathing and dressing. Pt also required total bed change out. Returned to bed with min assist and all needs met. Pt continues to benefit from skilled OT services.       If plan is discharge home, recommend the following:  A lot of help with walking and/or transfers;A lot of help with bathing/dressing/bathroom;Assistance with cooking/housework;Direct supervision/assist for medications management;Assist for transportation;Help with stairs or ramp for entrance   Equipment Recommendations  None recommended by OT    Recommendations for Other Services      Precautions / Restrictions Precautions Precautions: Fall Recall of Precautions/Restrictions: Intact Restrictions Weight Bearing  Restrictions Per Provider Order: No       Mobility Bed Mobility Overal bed mobility: Needs Assistance Bed Mobility: Supine to Sit, Sit to Supine     Supine to sit: Min assist Sit to supine: Min assist   General bed mobility comments: Increased time and effort, with min assist to pull up to sitting, assist with BLE to return to bed.    Transfers Overall transfer level: Needs assistance Equipment used: 1 person hand held assist Transfers: Sit to/from Stand, Bed to chair/wheelchair/BSC Sit to Stand: Min assist     Step pivot transfers: Mod assist     General transfer comment: Mod A for increased time and balance concerns, along with verbal cuing needed to take steps around to [facility]     Balance Overall balance assessment: Needs assistance Sitting-balance support: Feet supported, Single extremity supported Sitting balance-Leahy Scale: Fair Sitting balance - Comments: seated at EOB   Standing balance support: Reliant on assistive device for balance, During functional activity Standing balance-Leahy Scale: Poor Standing balance comment: using RW                           ADL either performed or assessed with clinical judgement   ADL Overall ADL's : Needs assistance/impaired     Grooming: Set up;Sitting Grooming Details (indicate cue type and reason): washed hands and face with wash cloth provided by OT     Lower Body Bathing: Maximal assistance;+2 for safety/equipment;Sitting/lateral leans;Sit to/from stand Lower Body Bathing Details (indicate cue type and reason): Max assist following BM in bed. completed on [facility]  Lower Body Dressing: Maximal assistance;Sit to/from stand;Sitting/lateral leans Lower Body Dressing Details (indicate cue type and reason): Incr3eased assist due to balance Toilet Transfer: Moderate assistance;Stand-pivot Toilet Transfer Details (indicate cue type and reason): face to face hand held transfer with step pivot to [facility] Toileting-  Clothing Manipulation and Hygiene: Maximal assistance;Sit to/from stand;Sitting/lateral lean Toileting - Clothing Manipulation Details (indicate cue type and reason): Max A for pericare       General ADL Comments: Pt requiring max assist this session due to incontinence and dried stool.    Extremity/Trunk Assessment Upper Extremity Assessment Upper Extremity Assessment: Generalized weakness   Lower Extremity Assessment Lower Extremity Assessment: Defer to PT evaluation        Vision   Vision Assessment?: Wears glasses for reading   Perception Perception Perception: Not tested   Praxis Praxis Praxis: Not tested   Communication Communication Communication: Impaired Factors Affecting Communication: Hearing impaired   Cognition Arousal: Alert Behavior During Therapy: WFL for tasks assessed/performed Cognition: History of cognitive impairments, Cognition impaired   Orientation impairments: Time, Situation Awareness: Intellectual awareness impaired Memory impairment (select all impairments): Short-term memory Attention impairment (select first level of impairment): Selective attention Executive functioning impairment (select all impairments): Problem solving OT - Cognition Comments: Pt following commands, slow to respond, at times confused                 Following commands: Intact        Cueing   Cueing Techniques: Verbal cues, Tactile cues, Gestural cues  Exercises Exercises: General Upper Extremity General Exercises - Upper Extremity Shoulder Flexion: AROM, Both, 10 reps Shoulder ABduction: AROM, Both, 10 reps Shoulder Horizontal ABduction: AROM, Both, 10 reps Elbow Flexion: AROM, Both, 10 reps    Shoulder Instructions       General Comments VSS on RA, BP remained stable throughuot session    Pertinent Vitals/ Pain       Pain Assessment Pain Assessment: No/denies pain  Home Living                                          Prior  Functioning/Environment              Frequency  Min 2X/week        Progress Toward Goals  OT Goals(current goals can now be found in the care plan section)  Progress towards OT goals: Progressing toward goals  Acute Rehab OT Goals Patient Stated Goal: None stated this session OT Goal Formulation: With patient Time For Goal Achievement: 04/18/24 Potential to Achieve Goals: Fair  Plan      Co-evaluation                 AM-PAC OT 6 Clicks Daily Activity     Outcome Measure   Help from another person eating meals?: A Little Help from another person taking care of personal grooming?: A Little Help from another person toileting, which includes using toliet, bedpan, or urinal?: A Lot Help from another person bathing (including washing, rinsing, drying)?: A Lot Help from another person to put on and taking off regular upper body clothing?: A Little Help from another person to put on and taking off regular lower body clothing?: A Lot 6 Click Score: 15    End of Session    OT Visit Diagnosis: Unsteadiness on feet (R26.81);Other abnormalities of gait and mobility (R26.89);Muscle weakness (generalized) (M62.81);History  of falling (Z91.81);Other symptoms and signs involving cognitive function   Activity Tolerance Patient tolerated treatment well   Patient Left in bed;with call bell/phone within reach;with bed alarm set   Nurse Communication Mobility status        Time: 1423-1510 OT Time Calculation (min): 47 min  Charges: OT General Charges $OT Visit: 1 Visit OT Treatments $Self Care/Home Management : 23-37 mins $Therapeutic Exercise: 8-22 mins  Marilyn Haas, OTR/L [facility] Acute Rehab  Marilyn Haas 04/13/2024, 3:26 PM

## 2024-04-13 NOTE — Plan of Care (Signed)

## 2024-04-18 ENCOUNTER — Inpatient Hospital Stay (HOSPITAL_COMMUNITY)
Admission: EM | Admit: 2024-04-18 | Discharge: 2024-04-20 | DRG: 377 | Disposition: A | Discharge status: Skilled Nursing Facility | Source: Skilled Nursing Facility

## 2024-04-18 ENCOUNTER — Inpatient Hospital Stay (HOSPITAL_COMMUNITY)

## 2024-04-18 ENCOUNTER — Other Ambulatory Visit: Payer: Self-pay

## 2024-04-18 ENCOUNTER — Encounter (HOSPITAL_COMMUNITY): Payer: Self-pay

## 2024-04-18 DIAGNOSIS — Z853 Personal history of malignant neoplasm of breast: Secondary | ICD-10-CM

## 2024-04-18 DIAGNOSIS — Z87891 Personal history of nicotine dependence: Secondary | ICD-10-CM

## 2024-04-18 DIAGNOSIS — Z860100 Personal history of colon polyps, unspecified: Secondary | ICD-10-CM

## 2024-04-18 DIAGNOSIS — K449 Diaphragmatic hernia without obstruction or gangrene: Secondary | ICD-10-CM | POA: Diagnosis present

## 2024-04-18 DIAGNOSIS — Z66 Do not resuscitate: Secondary | ICD-10-CM | POA: Diagnosis present

## 2024-04-18 DIAGNOSIS — Z79810 Long term (current) use of selective estrogen receptor modulators (SERMs): Secondary | ICD-10-CM

## 2024-04-18 DIAGNOSIS — K921 Melena: Principal | ICD-10-CM | POA: Diagnosis present

## 2024-04-18 DIAGNOSIS — R54 Age-related physical debility: Secondary | ICD-10-CM | POA: Diagnosis present

## 2024-04-18 DIAGNOSIS — D649 Anemia, unspecified: Secondary | ICD-10-CM | POA: Diagnosis present

## 2024-04-18 DIAGNOSIS — Z79899 Other long term (current) drug therapy: Secondary | ICD-10-CM

## 2024-04-18 DIAGNOSIS — Z8673 Personal history of transient ischemic attack (TIA), and cerebral infarction without residual deficits: Secondary | ICD-10-CM

## 2024-04-18 DIAGNOSIS — K922 Gastrointestinal hemorrhage, unspecified: Secondary | ICD-10-CM | POA: Diagnosis present

## 2024-04-18 DIAGNOSIS — E871 Hypo-osmolality and hyponatremia: Secondary | ICD-10-CM | POA: Diagnosis present

## 2024-04-18 DIAGNOSIS — I10 Essential (primary) hypertension: Secondary | ICD-10-CM | POA: Diagnosis present

## 2024-04-18 DIAGNOSIS — G9341 Metabolic encephalopathy: Secondary | ICD-10-CM | POA: Diagnosis present

## 2024-04-18 DIAGNOSIS — F32A Depression, unspecified: Secondary | ICD-10-CM | POA: Diagnosis present

## 2024-04-18 DIAGNOSIS — Z833 Family history of diabetes mellitus: Secondary | ICD-10-CM

## 2024-04-18 DIAGNOSIS — Z8249 Family history of ischemic heart disease and other diseases of the circulatory system: Secondary | ICD-10-CM

## 2024-04-18 DIAGNOSIS — N390 Urinary tract infection, site not specified: Secondary | ICD-10-CM | POA: Diagnosis present

## 2024-04-18 DIAGNOSIS — Z923 Personal history of irradiation: Secondary | ICD-10-CM

## 2024-04-18 DIAGNOSIS — Z881 Allergy status to other antibiotic agents status: Secondary | ICD-10-CM

## 2024-04-18 DIAGNOSIS — F419 Anxiety disorder, unspecified: Secondary | ICD-10-CM | POA: Diagnosis present

## 2024-04-18 DIAGNOSIS — D62 Acute posthemorrhagic anemia: Secondary | ICD-10-CM | POA: Diagnosis not present

## 2024-04-18 DIAGNOSIS — K222 Esophageal obstruction: Secondary | ICD-10-CM | POA: Diagnosis present

## 2024-04-18 DIAGNOSIS — G8929 Other chronic pain: Secondary | ICD-10-CM | POA: Diagnosis present

## 2024-04-18 DIAGNOSIS — M810 Age-related osteoporosis without current pathological fracture: Secondary | ICD-10-CM | POA: Diagnosis present

## 2024-04-18 LAB — URINALYSIS, ROUTINE W REFLEX MICROSCOPIC
Bilirubin Urine: NEGATIVE
Glucose, UA: NEGATIVE mg/dL
Ketones, ur: NEGATIVE mg/dL
Nitrite: POSITIVE — AB
Protein, ur: NEGATIVE mg/dL
Specific Gravity, Urine: 1.005 (ref 1.005–1.030)
WBC, UA: >50 WBC/hpf (ref 0–5)
pH: 7 (ref 5.0–8.0)

## 2024-04-18 LAB — COMPREHENSIVE METABOLIC PANEL WITH GFR
ALT: 11 U/L (ref 0–44)
AST: 25 U/L (ref 15–41)
Albumin: 2.6 g/dL — ABNORMAL LOW (ref 3.5–5.0)
Alkaline Phosphatase: 73 U/L (ref 38–126)
Anion gap: 9 (ref 5–15)
BUN: 7 mg/dL — ABNORMAL LOW (ref 8–23)
CO2: 24 mmol/L (ref 22–32)
Calcium: 7.7 mg/dL — ABNORMAL LOW (ref 8.9–10.3)
Chloride: 93 mmol/L — ABNORMAL LOW (ref 98–111)
Creatinine, Ser: 0.83 mg/dL (ref 0.44–1.00)
GFR, Estimated: >60 mL/min
Glucose, Bld: 100 mg/dL — ABNORMAL HIGH (ref 70–99)
Potassium: 3.6 mmol/L (ref 3.5–5.1)
Sodium: 126 mmol/L — ABNORMAL LOW (ref 135–145)
Total Bilirubin: 0.2 mg/dL (ref 0.0–1.2)
Total Protein: 4.8 g/dL — ABNORMAL LOW (ref 6.5–8.1)

## 2024-04-18 LAB — CBC WITH DIFFERENTIAL/PLATELET
Abs Immature Granulocytes: 0.04 10*3/uL (ref 0.00–0.07)
Basophils Absolute: 0.1 10*3/uL (ref 0.0–0.1)
Basophils Relative: 1 %
Eosinophils Absolute: 0.1 10*3/uL (ref 0.0–0.5)
Eosinophils Relative: 4 %
HCT: 16.1 % — ABNORMAL LOW (ref 36.0–46.0)
Hemoglobin: 5.2 g/dL — CL (ref 12.0–15.0)
Immature Granulocytes: 1 %
Lymphocytes Relative: 22 %
Lymphs Abs: 0.8 10*3/uL (ref 0.7–4.0)
MCH: 31.3 pg (ref 26.0–34.0)
MCHC: 32.3 g/dL (ref 30.0–36.0)
MCV: 97 fL (ref 80.0–100.0)
Monocytes Absolute: 0.6 10*3/uL (ref 0.1–1.0)
Monocytes Relative: 16 %
Neutro Abs: 2 10*3/uL (ref 1.7–7.7)
Neutrophils Relative %: 56 %
Platelets: 394 10*3/uL (ref 150–400)
RBC: 1.66 MIL/uL — ABNORMAL LOW (ref 3.87–5.11)
RDW: 14 % (ref 11.5–15.5)
WBC: 3.6 10*3/uL — ABNORMAL LOW (ref 4.0–10.5)
nRBC: 0 % (ref 0.0–0.2)

## 2024-04-18 LAB — HEMOGLOBIN AND HEMATOCRIT, BLOOD
HCT: 31.1 % — ABNORMAL LOW (ref 36.0–46.0)
Hemoglobin: 10.2 g/dL — ABNORMAL LOW (ref 12.0–15.0)

## 2024-04-18 LAB — PROTIME-INR
INR: 1.1 (ref 0.8–1.2)
Prothrombin Time: 14.4 s (ref 11.4–15.2)

## 2024-04-18 LAB — PREPARE RBC (CROSSMATCH)

## 2024-04-18 MED ORDER — ACETAMINOPHEN 650 MG RE SUPP: 650.0000 mg | Freq: Four times a day (QID) | RECTAL | Status: DC | PRN

## 2024-04-18 MED ORDER — PANTOPRAZOLE SODIUM 40 MG IV SOLR
40.0000 mg | Freq: Once | INTRAVENOUS | Status: AC
  Administered 2024-04-18: 40 mg via INTRAVENOUS
  Filled 2024-04-18: qty 10

## 2024-04-18 MED ORDER — HYDRALAZINE HCL 20 MG/ML IJ SOLN: 10.0000 mg | Freq: Four times a day (QID) | INTRAMUSCULAR | Status: DC | PRN

## 2024-04-18 MED ORDER — ACETAMINOPHEN 325 MG PO TABS
650.0000 mg | ORAL_TABLET | Freq: Four times a day (QID) | ORAL | Status: DC | PRN
  Administered 2024-04-19: 650 mg via ORAL
  Filled 2024-04-18: qty 2

## 2024-04-18 MED ORDER — SODIUM CHLORIDE 0.9 % IV BOLUS
500.0000 mL | Freq: Once | INTRAVENOUS | Status: AC
  Administered 2024-04-18: 500 mL via INTRAVENOUS

## 2024-04-18 MED ORDER — SODIUM CHLORIDE 0.9 % IV SOLN
2.0000 g | Freq: Once | INTRAVENOUS | Status: AC
  Administered 2024-04-18: 2 g via INTRAVENOUS
  Filled 2024-04-18: qty 20

## 2024-04-18 MED ORDER — SODIUM CHLORIDE 0.9 % IV SOLN: INTRAVENOUS | Status: DC

## 2024-04-18 MED ORDER — ONDANSETRON HCL 4 MG/2ML IJ SOLN: 4.0000 mg | Freq: Four times a day (QID) | INTRAMUSCULAR | Status: DC | PRN

## 2024-04-18 MED ORDER — LACTATED RINGERS IV SOLN: INTRAVENOUS | Status: AC

## 2024-04-18 MED ORDER — PANTOPRAZOLE SODIUM 40 MG IV SOLR
40.0000 mg | Freq: Two times a day (BID) | INTRAVENOUS | Status: DC
  Administered 2024-04-18 – 2024-04-20 (×4): 40 mg via INTRAVENOUS
  Filled 2024-04-18 (×4): qty 10

## 2024-04-18 MED ORDER — SODIUM CHLORIDE 0.9% IV SOLUTION: Freq: Once | INTRAVENOUS | Status: DC

## 2024-04-18 MED ORDER — ONDANSETRON HCL 4 MG PO TABS: 4.0000 mg | ORAL_TABLET | Freq: Four times a day (QID) | ORAL | Status: DC | PRN

## 2024-04-18 NOTE — ED Notes (Signed)
 Pt is stable this time with no issues with blood transfusion.

## 2024-04-19 ENCOUNTER — Inpatient Hospital Stay (HOSPITAL_COMMUNITY): Admitting: Certified Registered"

## 2024-04-19 ENCOUNTER — Encounter (HOSPITAL_COMMUNITY)
Admission: EM | Disposition: A | Discharge status: Skilled Nursing Facility | Payer: Self-pay | Source: Skilled Nursing Facility

## 2024-04-19 DIAGNOSIS — K449 Diaphragmatic hernia without obstruction or gangrene: Secondary | ICD-10-CM

## 2024-04-19 DIAGNOSIS — D649 Anemia, unspecified: Secondary | ICD-10-CM

## 2024-04-19 DIAGNOSIS — K269 Duodenal ulcer, unspecified as acute or chronic, without hemorrhage or perforation: Secondary | ICD-10-CM

## 2024-04-19 DIAGNOSIS — K2950 Unspecified chronic gastritis without bleeding: Secondary | ICD-10-CM

## 2024-04-19 DIAGNOSIS — K222 Esophageal obstruction: Secondary | ICD-10-CM

## 2024-04-19 LAB — BPAM RBC
Blood Product Expiration Date: 202602162359
Blood Product Expiration Date: 202603072359
ISSUE DATE / TIME: 202602031343
ISSUE DATE / TIME: 202602031652
Unit Type and Rh: 5100
Unit Type and Rh: 5100

## 2024-04-19 LAB — CBC
HCT: 27 % — ABNORMAL LOW (ref 36.0–46.0)
HCT: 28.9 % — ABNORMAL LOW (ref 36.0–46.0)
Hemoglobin: 9 g/dL — ABNORMAL LOW (ref 12.0–15.0)
Hemoglobin: 9.4 g/dL — ABNORMAL LOW (ref 12.0–15.0)
MCH: 29.9 pg (ref 26.0–34.0)
MCH: 29.7 pg (ref 26.0–34.0)
MCHC: 33.3 g/dL (ref 30.0–36.0)
MCHC: 32.5 g/dL (ref 30.0–36.0)
MCV: 89.7 fL (ref 80.0–100.0)
MCV: 91.2 fL (ref 80.0–100.0)
Platelets: 416 10*3/uL — ABNORMAL HIGH (ref 150–400)
Platelets: 401 10*3/uL — ABNORMAL HIGH (ref 150–400)
RBC: 3.01 MIL/uL — ABNORMAL LOW (ref 3.87–5.11)
RBC: 3.17 MIL/uL — ABNORMAL LOW (ref 3.87–5.11)
RDW: 16.6 % — ABNORMAL HIGH (ref 11.5–15.5)
RDW: 16.7 % — ABNORMAL HIGH (ref 11.5–15.5)
WBC: 3.9 10*3/uL — ABNORMAL LOW (ref 4.0–10.5)
WBC: 4 10*3/uL (ref 4.0–10.5)
nRBC: 0 % (ref 0.0–0.2)
nRBC: 0 % (ref 0.0–0.2)

## 2024-04-19 LAB — TYPE AND SCREEN
ABO/RH(D): O POS
Antibody Screen: NEGATIVE
Unit division: 0
Unit division: 0

## 2024-04-19 LAB — BASIC METABOLIC PANEL WITH GFR
Anion gap: 12 (ref 5–15)
BUN: 6 mg/dL — ABNORMAL LOW (ref 8–23)
CO2: 20 mmol/L — ABNORMAL LOW (ref 22–32)
Calcium: 7.6 mg/dL — ABNORMAL LOW (ref 8.9–10.3)
Chloride: 96 mmol/L — ABNORMAL LOW (ref 98–111)
Creatinine, Ser: 0.69 mg/dL (ref 0.44–1.00)
GFR, Estimated: >60 mL/min
Glucose, Bld: 70 mg/dL (ref 70–99)
Potassium: 3.8 mmol/L (ref 3.5–5.1)
Sodium: 128 mmol/L — ABNORMAL LOW (ref 135–145)

## 2024-04-19 MED ORDER — GUAIFENESIN 100 MG/5ML PO LIQD: 5.0000 mL | ORAL | Status: DC | PRN

## 2024-04-19 MED ORDER — STERILE WATER FOR IRRIGATION IR SOLN
Status: DC | PRN
  Administered 2024-04-19 (×2): 100 mL

## 2024-04-19 MED ORDER — PROPOFOL 10 MG/ML IV BOLUS
INTRAVENOUS | Status: DC | PRN
  Administered 2024-04-19: 60 mg via INTRAVENOUS
  Administered 2024-04-19: 125 ug/kg/min via INTRAVENOUS

## 2024-04-19 MED ORDER — LIDOCAINE HCL (CARDIAC) PF 100 MG/5ML IV SOSY
PREFILLED_SYRINGE | INTRAVENOUS | Status: DC | PRN
  Administered 2024-04-19: 50 mg via INTRAVENOUS

## 2024-04-19 MED ORDER — TAMOXIFEN CITRATE 10 MG PO TABS
10.0000 mg | ORAL_TABLET | ORAL | Status: DC
  Administered 2024-04-19: 10 mg via ORAL
  Filled 2024-04-19: qty 1

## 2024-04-19 MED ORDER — ENSURE PLUS HIGH PROTEIN PO LIQD: 237.0000 mL | Freq: Two times a day (BID) | ORAL | Status: DC

## 2024-04-19 MED ORDER — ARIPIPRAZOLE 5 MG PO TABS
5.0000 mg | ORAL_TABLET | Freq: Every day | ORAL | Status: DC
  Administered 2024-04-19 – 2024-04-20 (×2): 5 mg via ORAL
  Filled 2024-04-19 (×2): qty 1

## 2024-04-19 MED ORDER — SODIUM CHLORIDE 1 G PO TABS
1.0000 g | ORAL_TABLET | Freq: Two times a day (BID) | ORAL | Status: DC
  Administered 2024-04-19 – 2024-04-20 (×2): 1 g via ORAL
  Filled 2024-04-19 (×2): qty 1

## 2024-04-19 MED ORDER — MELATONIN 3 MG PO TABS: 6.0000 mg | ORAL_TABLET | Freq: Every evening | ORAL | Status: DC | PRN

## 2024-04-19 MED ORDER — SUCRALFATE 1 GM/10ML PO SUSP
1.0000 g | Freq: Three times a day (TID) | ORAL | Status: DC
  Administered 2024-04-19 – 2024-04-20 (×5): 1 g via ORAL
  Filled 2024-04-19 (×6): qty 10

## 2024-04-19 MED ORDER — CITALOPRAM HYDROBROMIDE 20 MG PO TABS
20.0000 mg | ORAL_TABLET | Freq: Every day | ORAL | Status: DC
  Administered 2024-04-19 – 2024-04-20 (×2): 20 mg via ORAL
  Filled 2024-04-19 (×2): qty 1

## 2024-04-19 NOTE — Interval H&P Note (Signed)
 History and Physical Interval Note:  04/19/2024 8:57 AM  Marilyn Haas  has presented today for surgery, with the diagnosis of anemia, melena.  The various methods of treatment have been discussed with the patient and family. After consideration of risks, benefits and other options for treatment, the patient has consented to  Procedures: EGD (ESOPHAGOGASTRODUODENOSCOPY) (N/A) as a surgical intervention.  The patient's history has been reviewed, patient examined, no change in status, stable for surgery.  I have reviewed the patient's chart and labs.  Questions were answered to the patient's satisfaction.     Lamar Hollingshead   patient seen and examined in short stay.  Stable overnight hemoglobin 9.0 this morning after 2 units since admission.  Discussed risk benefits limitations with son.  Patient has dysphagia to a variety of foods per son.  No teeth.  Esophageal stenosis noted previously and dilated.  No NSAIDs no anticoagulation no other antiplatelet therapy EGD with therapeutic intervention and possible esophageal dilation is feasible/appropriate per plan.  Questions answered all parties agreeable.  Further recommendations to follow.

## 2024-04-19 NOTE — Anesthesia Preprocedure Evaluation (Signed)
"                                    Anesthesia Evaluation  Patient identified by MRN, date of birth, ID band Patient awake    Reviewed: Allergy & Precautions, H&P , NPO status , Patient's Chart, lab work & pertinent test results, reviewed documented beta blocker date and time   Airway Mallampati: II  TM Distance: >3 FB Neck ROM: full    Dental no notable dental hx.    Pulmonary neg pulmonary ROS, former smoker   Pulmonary exam normal breath sounds clear to auscultation       Cardiovascular Exercise Tolerance: Good hypertension,  Rhythm:regular Rate:Normal     Neuro/Psych  PSYCHIATRIC DISORDERS Anxiety Depression    negative neurological ROS     GI/Hepatic Neg liver ROS, hiatal hernia, PUD,GERD  ,,  Endo/Other  negative endocrine ROS    Renal/GU negative Renal ROS  negative genitourinary   Musculoskeletal   Abdominal   Peds  Hematology  (+) Blood dyscrasia, anemia   Anesthesia Other Findings   Reproductive/Obstetrics negative OB ROS                              Anesthesia Physical Anesthesia Plan  ASA: 3 and emergent  Anesthesia Plan: MAC   Post-op Pain Management:    Induction:   PONV Risk Score and Plan: Propofol  infusion  Airway Management Planned:   Additional Equipment:   Intra-op Plan:   Post-operative Plan:   Informed Consent: I have reviewed the patients History and Physical, chart, labs and discussed the procedure including the risks, benefits and alternatives for the proposed anesthesia with the patient or authorized representative who has indicated his/her understanding and acceptance.     Dental Advisory Given  Plan Discussed with: CRNA  Anesthesia Plan Comments:         Anesthesia Quick Evaluation  "

## 2024-04-19 NOTE — Progress Notes (Signed)
 Pt called me into room asking for her cell phone I contacted her son Josue that is on her contact list as well as who was here this morning. He let me know that it is over at [facility]. I have informed pt that it is not here with her at the hospital.

## 2024-04-19 NOTE — Plan of Care (Signed)
" °  Problem: Acute Rehab PT Goals(only PT should resolve) Goal: Pt Will Go Supine/Side To Sit Outcome: Progressing Flowsheets (Taken 04/19/2024 1616) Pt will go Supine/Side to Sit: with contact guard assist Goal: Patient Will Transfer Sit To/From Stand Outcome: Progressing Flowsheets (Taken 04/19/2024 1616) Patient will transfer sit to/from stand: with minimal assist Goal: Pt Will Transfer Bed To Chair/Chair To Bed Outcome: Progressing Flowsheets (Taken 04/19/2024 1616) Pt will Transfer Bed to Chair/Chair to Bed: with contact guard assist Goal: Pt Will Ambulate Outcome: Progressing Flowsheets (Taken 04/19/2024 1616) Pt will Ambulate:  10 feet  with rolling walker  with contact guard assist   4:16 PM, 04/19/24 Rosaria Settler, PT, DPT [place] [facility]  "

## 2024-04-19 NOTE — NC FL2 (Signed)
 " [place]  MEDICAID FL2 LEVEL OF CARE FORM     IDENTIFICATION  Patient Name: Marilyn  ANNALISSA Haas Birthdate: April 03, 1940 Sex: female Admission Date (Current Location): 04/18/2024  [facility] and [name] Number:  [employer] and Address:  [facility],  [address] [place] [address]      Provider Number: [id number]  Attending Physician Name and Address:  Cosette Blackwater, MD  Relative Name and Phone Number:  Mysty Kielty ( son) [phone]    Current Level of Care: Hospital Recommended Level of Care: Skilled Nursing Facility Prior Approval Number:    Date Approved/Denied:   PASRR Number: [id number]  Discharge Plan: SNF    Current Diagnoses: Patient Active Problem List   Diagnosis Date Noted   Symptomatic anemia 04/18/2024   Acute metabolic encephalopathy 04/18/2024   Duodenal ulcer 12/08/2022   Acute upper GI bleeding 12/08/2022   Hyponatremia 12/07/2022   Hypokalemia 12/07/2022   GERD without esophagitis 12/07/2022   Anxiety and depression 12/07/2022   History of right breast cancer 12/07/2022   Confusion 12/07/2022   Alcohol abuse 12/07/2022   Pressure injury of skin 12/07/2022   Frequent falls 02/14/2022   Upper GI bleed 02/06/2022   Elevated BUN 02/06/2022   GERD (gastroesophageal reflux disease) 07/05/2021   Fall at home, initial encounter 07/04/2021   MDD (major depressive disorder), recurrent episode, moderate (HCC) 06/13/2018   Dysphagia 09/01/2017   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 (HCC) 08/13/2016   Malignant neoplasm of upper-outer quadrant of right breast in female, estrogen receptor positive (HCC) 06/22/2016   Osteopenia determined by x-ray 06/22/2016   Chronic hyponatremia 10/12/2015   PVC's (premature ventricular contractions) 12/16/2014   ABLA (acute blood loss anemia) 08/09/2012   B12 deficiency anemia 08/09/2012   Vitamin D deficiency 08/09/2012   Anxiety 08/09/2012   Hyperlipidemia 05/25/2011    Essential hypertension 05/25/2011    Orientation RESPIRATION BLADDER Height & Weight     Self  Normal Indwelling catheter Weight: 130 lb (59 kg) Height:  5' 5 (165.1 cm)  BEHAVIORAL SYMPTOMS/MOOD NEUROLOGICAL BOWEL NUTRITION STATUS      Continent Diet (See D/C summary)  AMBULATORY STATUS COMMUNICATION OF NEEDS Skin   Extensive Assist Verbally Normal                       Personal Care Assistance Level of Assistance  Bathing, Feeding, Dressing Bathing Assistance: Maximum assistance Feeding assistance: Limited assistance Dressing Assistance: Maximum assistance     Functional Limitations Info  Hearing, Sight, Speech Sight Info: Adequate Hearing Info: Adequate Speech Info: Adequate    SPECIAL CARE FACTORS FREQUENCY  PT (By licensed PT), OT (By licensed OT)     PT Frequency: 5 times weekly OT Frequency: 5 times weekly            Contractures Contractures Info: Not present    Additional Factors Info  Code Status, Allergies Code Status Info: DNR Allergies Info: Avelox (moxifloxacin Hcl In Nacl)           Current Medications (04/19/2024):  This is the current hospital active medication list Current Facility-Administered Medications  Medication Dose Route Frequency Provider Last Rate Last Admin   0.9 %  sodium chloride  infusion (Manually program via Guardrails IV Fluids)   Intravenous Once Triplett, Tammy, PA-C       acetaminophen  (TYLENOL ) tablet 650 mg  650 mg Oral Q6H PRN Darci Pore, MD       Or   acetaminophen  (  TYLENOL ) suppository 650 mg  650 mg Rectal Q6H PRN Darci Pore, MD       hydrALAZINE  (APRESOLINE ) injection 10 mg  10 mg Intravenous Q6H PRN Darci Pore, MD       lactated ringers  infusion   Intravenous Continuous Darci Pore, MD 40 mL/hr at 04/18/24 2059 Restarted at 04/19/24 0908   ondansetron  (ZOFRAN ) tablet 4 mg  4 mg Oral Q6H PRN Darci Pore, MD       Or   ondansetron  (ZOFRAN ) injection 4 mg  4  mg Intravenous Q6H PRN Darci Pore, MD       pantoprazole  (PROTONIX ) injection 40 mg  40 mg Intravenous Q12H Sreeram, Narendranath, MD   40 mg at 04/18/24 2054   sucralfate  (CARAFATE ) 1 GM/10ML suspension 1 g  1 g Oral TID WC & HS Rourk, Lamar HERO, MD         Discharge Medications: Please see discharge summary for a list of discharge medications.  Relevant Imaging Results:  Relevant Lab Results:   Additional Information SSN: [id number] [place]     "

## 2024-04-19 NOTE — Evaluation (Signed)
 Physical Therapy Evaluation Patient Details Name: Marilyn Haas MRN: [id number] DOB: [birth year] Today's Date: 04/19/2024  History of Present Illness  HPI: Marilyn  H Haas is a 84 y.o. female with medical history significant of breast cancer on tamoxifen , hypertension, history of chronic blood loss anemia due to upper GI bleed from duodenal ulcer, chronic anxiety disorder, bilateral sensorineural hearing loss, chronic hyponatremia presented from [facility] for hemoglobin of 5.0.  Patient is a poor historian, able to tell that she has dark stools. Denies abdominal pain, nausea. No fever, chills. During my exam she is mostly sleepy but arousable.  Most of the history is obtained from ED, nursing home records. Last admit 04/03/24 for altered mentation, low na.     In the emergency department, patient is noted to hemoglobin of 5.2, patient is started on Rocephin , PPI, IV fluid bolus, GI consulted who advised inpatient admission for EGD evaluation tomorrow.  TRH consulted for inpatient admission further management accordingly.   Clinical Impression  Patient appeared agreeable to PT/OT co-evaluation. Patient is confused throughout session and provides limited recent level of function. However, she reports she uses a RW and is assisted with bathing/dressing. This date, patient requires min/mod assist for bed mobility, and moderate assist with transfers and a few short steps at bedside. Patient performs 3 STS in session. She is able to stand for at least 3 minutes during pericare before requiring a seated rest break. Patient is generally weak throughout and demonstrates impaired balance, endurance, and cognition. Patient tolerates sitting in chair at EOS, alarm set and call button in reach. Patient will benefit from continued skilled physical therapy acutely and in recommended venue in order to address current deficits and improve overall function.        If plan is discharge home, recommend the  following: A lot of help with bathing/dressing/bathroom;A lot of help with walking and/or transfers;Help with stairs or ramp for entrance;Assist for transportation;Assistance with cooking/housework   Can travel by private vehicle        Equipment Recommendations None recommended by PT  Recommendations for Other Services       Functional Status Assessment Patient has had a recent decline in their functional status and demonstrates the ability to make significant improvements in function in a reasonable and predictable amount of time.     Precautions / Restrictions Precautions Precautions: Fall Recall of Precautions/Restrictions: Impaired Restrictions Weight Bearing Restrictions Per Provider Order: No      Mobility  Bed Mobility Overal bed mobility: Needs Assistance Bed Mobility: Supine to Sit, Sit to Supine     Supine to sit: Mod assist, Min assist     General bed mobility comments: min/mod assist to get EOB, pt demo slow labored movement, consistent verbal cueing needed    Transfers Overall transfer level: Needs assistance Equipment used: Rolling walker (2 wheels) Transfers: Sit to/from Stand, Bed to chair/wheelchair/BSC Sit to Stand: Mod assist   Step pivot transfers: Mod assist       General transfer comment: mod A for 2 STS during session with RW due to LE weakness, mod assist during transfer due to unsteadiness, assisted with RW management intermittently throughout    Ambulation/Gait Ambulation/Gait assistance: Mod assist Gait Distance (Feet): 3 Feet Assistive device: Rolling walker (2 wheels) Gait Pattern/deviations: Decreased step length - right, Decreased step length - left, Decreased stride length Gait velocity: decreased     General Gait Details: limited to a few side steps at bedside with RW and mod assist for verbal  cueing, steadying, and RW management at times, limited mostly by fatigue  Stairs            Wheelchair Mobility     Tilt  Bed    Modified Rankin (Stroke Patients Only)       Balance Overall balance assessment: Needs assistance Sitting-balance support: Feet supported, Bilateral upper extremity supported Sitting balance-Leahy Scale: Good Sitting balance - Comments: seated at EOB   Standing balance support: Reliant on assistive device for balance, During functional activity, Bilateral upper extremity supported Standing balance-Leahy Scale: Fair Standing balance comment: using RW                             Pertinent Vitals/Pain Pain Assessment Pain Assessment: No/denies pain    Home Living Family/patient expects to be discharged to:: Skilled nursing facility                   Additional Comments: pt from [facility]    Prior Function Prior Level of Function : Needs assist       Physical Assist : ADLs (physical);Mobility (physical) Mobility (physical): Transfers;Gait;Bed mobility ADLs (physical): IADLs;Dressing;Bathing Mobility Comments: pt reports she uses RW to ambulate ADLs Comments: ADLs assisted by SNF staff     Extremity/Trunk Assessment   Upper Extremity Assessment Upper Extremity Assessment: Defer to OT evaluation    Lower Extremity Assessment Lower Extremity Assessment: Generalized weakness    Cervical / Trunk Assessment Cervical / Trunk Assessment: Kyphotic  Communication   Communication Communication: No apparent difficulties Factors Affecting Communication: Hearing impaired;Other (comment) (Very HOH)    Cognition Arousal: Alert Behavior During Therapy: WFL for tasks assessed/performed                             Following commands: Intact       Cueing Cueing Techniques: Verbal cues, Tactile cues, Gestural cues, Visual cues     General Comments      Exercises     Assessment/Plan    PT Assessment Patient needs continued PT services;All further PT needs can be met in the next venue of care  PT Problem List Decreased  strength;Decreased activity tolerance;Decreased balance;Decreased mobility       PT Treatment Interventions DME instruction;Gait training;Stair training;Functional mobility training;Therapeutic activities;Therapeutic exercise;Balance training;Patient/family education    PT Goals (Current goals can be found in the Care Plan section)  Acute Rehab PT Goals Patient Stated Goal: Return to SNF PT Goal Formulation: With patient Time For Goal Achievement: 04/21/24 Potential to Achieve Goals: Good    Frequency Min 3X/week     Co-evaluation PT/OT/SLP Co-Evaluation/Treatment: Yes Reason for Co-Treatment: To address functional/ADL transfers PT goals addressed during session: Mobility/safety with mobility OT goals addressed during session: ADL's and self-care       AM-PAC PT 6 Clicks Mobility  Outcome Measure Help needed turning from your back to your side while in a flat bed without using bedrails?: A Little Help needed moving from lying on your back to sitting on the side of a flat bed without using bedrails?: A Lot Help needed moving to and from a bed to a chair (including a wheelchair)?: A Little Help needed standing up from a chair using your arms (e.g., wheelchair or bedside chair)?: A Lot Help needed to walk in hospital room?: A Little Help needed climbing 3-5 steps with a railing? : A Lot 6 Click Score: 15  End of Session Equipment Utilized During Treatment: Gait belt Activity Tolerance: Patient limited by fatigue;Patient tolerated treatment well Patient left: in chair;with call bell/phone within reach;with chair alarm set   PT Visit Diagnosis: Unsteadiness on feet (R26.81);Other abnormalities of gait and mobility (R26.89);Muscle weakness (generalized) (M62.81)    Time: 8572-8552 PT Time Calculation (min) (ACUTE ONLY): 20 min   Charges:   PT Evaluation $PT Eval Low Complexity: 1 Low   PT General Charges $$ ACUTE PT VISIT: 1 Visit        4:15 PM, 04/19/24 Rian Koon  [place], PT, DPT [place] with [facility]

## 2024-04-19 NOTE — Plan of Care (Signed)

## 2024-04-19 NOTE — Progress Notes (Addendum)
 " PROGRESS NOTE    Marilyn  RAINIE Haas  FMW:[id number] DOB: [birth year] DOA: 04/18/2024 PCP: Orpha Yancey LABOR, MD  Subjective: No acute events overnight. Seen and examined at bedside after EGD. Reports feeling better. Denies any nausea, vomiting, BRBPR, melena, hemoptysis, hematemesis.   Hospital Course:  84 y.o. female with medical history significant of breast cancer on tamoxifen , hypertension, history of chronic blood loss anemia due to upper GI bleed from duodenal ulcer, chronic anxiety disorder, bilateral sensorineural hearing loss, chronic hyponatremia presented from [facility] for hemoglobin of 5.0.  Patient is a poor historian, able to tell that she has dark stools. Denies abdominal pain, nausea. No fever, chills. During my exam she is mostly sleepy but arousable.  Most of the history is obtained from ED, nursing home records. Last admit 04/03/24 for altered mentation, low Na. In the emergency department, patient is noted to hemoglobin of 5.2, patient is started on Rocephin , PPI, IV fluid bolus, GI consulted who advised inpatient admission for EGD evaluation. Admitted for acute on chronic anemia due to acute GI bleed.    Assessment and Plan:  84 y.o. female with medical history significant of breast cancer on tamoxifen , hypertension, history of chronic blood loss anemia due to upper GI bleed from duodenal ulcer, chronic anxiety disorder, bilateral sensorineural hearing loss, chronic hyponatremia presented from [facility] for hemoglobin of 5.2.  Patient will need close hemodynamic monitoring, telemetry, blood transfusion and further management, GI evaluation.   Symptomatic anemia Acute blood loss anemia Upper GI bleed: History of duodenal ulcer Hgb 9.4 < 9, presented with hemoglobin of 5.2, baseline Hb around 7-9. EGD showed esophageal stenosis status post Maloney dilation, 10cm hiatal hernia, clean-based duodenal bulbar ulcer Transfused 2 units of blood. Continue PPI twice  daily Cont sucralfate  ACHS Gastric biopsies pending Clear liquid diet, advance as tolerated GI following Continue to monitor H&H closely and transfuse for hemoglobin less than 7.   Chronic Hyponatremia Hx of Beer Protomania: Sodium 128 < 126, baseline sodium 125-130. Patient is on salt tablets at home, will continue   Acute metabolic encephalopathy: Baseline mentation unclear As per chart review, seems to have some baseline confusion  Monitor PO intake, I/Os, Bms, sleep routine Monitor clinically on delirium precautions, fall precautions.   Breast cancer  Resume tamoxifen  upon discharge.   History of strokes: Cont statin once able to take orally.   Chronic back pain: Hold pain medications as she is lethargic.   Chronic anxiety disorder Resume home citalopram  and aripiprazole  Hold home venlafaxine   Generalized weakness: PT OT following Patient will return to SNF after GI workup, hemoglobin stable.  DVT prophylaxis: SCDs Start: 04/18/24 1401  SCDs   Code Status: Limited: Do not attempt resuscitation (DNR) -DNR-LIMITED -Do Not Intubate/DNI   Disposition Plan: SNF Reason for continuing need for hospitalization: monitor Hgb, monitor for clinical improvement  Objective: Vitals:   04/19/24 0945 04/19/24 0957 04/19/24 1014 04/19/24 1254  BP: 103/73 (!) 124/56 (!) 122/59 (!) 142/67  Pulse: 95 87 87 90  Resp: 18 16    Temp:   98.8 F (37.1 C) (!) 100.5 F (38.1 C)  TempSrc:   Oral Oral  SpO2: 100% 98% 97% 91%  Weight:      Height:        Intake/Output Summary (Last 24 hours) at 04/19/2024 1558 Last data filed at 04/18/2024 2300 Gross per 24 hour  Intake 890.08 ml  Output 500 ml  Net 390.08 ml   Filed Weights   04/18/24 1025  Weight: 59 kg    Examination:  Physical Exam Vitals and nursing note reviewed.  Constitutional:      General: She is not in acute distress.    Appearance: She is ill-appearing.     Comments: Frail, weak  HENT:     Head:  Normocephalic and atraumatic.  Cardiovascular:     Rate and Rhythm: Normal rate and regular rhythm.     Pulses: Normal pulses.     Heart sounds: Normal heart sounds.  Pulmonary:     Effort: Pulmonary effort is normal.     Breath sounds: Normal breath sounds.  Abdominal:     General: Bowel sounds are normal.     Palpations: Abdomen is soft.  Neurological:     Mental Status: She is alert.     Data Reviewed: I have personally reviewed following labs and imaging studies  CBC: Recent Labs  Lab 04/18/24 1040 04/18/24 2009 04/19/24 0410 04/19/24 1344  WBC 3.6*  --  3.9* 4.0  NEUTROABS 2.0  --   --   --   HGB 5.2* 10.2* 9.0* 9.4*  HCT 16.1* 31.1* 27.0* 28.9*  MCV 97.0  --  89.7 91.2  PLT 394  --  416* 401*   Basic Metabolic Panel: Recent Labs  Lab 04/13/24 0431 04/18/24 1040 04/19/24 0410  NA 129* 126* 128*  K 4.0 3.6 3.8  CL 98 93* 96*  CO2 24 24 20*  GLUCOSE 98 100* 70  BUN 19 7* 6*  CREATININE 0.76 0.83 0.69  CALCIUM 7.9* 7.7* 7.6*   GFR: Estimated Creatinine Clearance: 46.3 mL/min (by C-G formula based on SCr of 0.69 mg/dL). Liver Function Tests: Recent Labs  Lab 04/18/24 1040  AST 25  ALT 11  ALKPHOS 73  BILITOT 0.2  PROT 4.8*  ALBUMIN 2.6*   No results for input(s): LIPASE, AMYLASE in the last 168 hours. No results for input(s): AMMONIA in the last 168 hours. Coagulation Profile: Recent Labs  Lab 04/18/24 1040  INR 1.1   Cardiac Enzymes: No results for input(s): CKTOTAL, CKMB, CKMBINDEX, TROPONINI in the last 168 hours. ProBNP, BNP (last 5 results) No results for input(s): PROBNP, BNP in the last 8760 hours. HbA1C: No results for input(s): HGBA1C in the last 72 hours. CBG: No results for input(s): GLUCAP in the last 168 hours. Lipid Profile: No results for input(s): CHOL, HDL, LDLCALC, TRIG, CHOLHDL, LDLDIRECT in the last 72 hours. Thyroid  Function Tests: No results for input(s): TSH, T4TOTAL, FREET4,  T3FREE, THYROIDAB in the last 72 hours. Anemia Panel: No results for input(s): VITAMINB12, FOLATE, FERRITIN, TIBC, IRON, RETICCTPCT in the last 72 hours. Sepsis Labs: No results for input(s): PROCALCITON, LATICACIDVEN in the last 168 hours.  Recent Results (from the past 240 hours)  Urine Culture     Status: Abnormal (Preliminary result)   Collection Time: 04/18/24 10:50 AM   Specimen: Urine, Catheterized  Result Value Ref Range Status   Specimen Description   Final    URINE, CATHETERIZED Performed at [facility], [address] [place], [place] [address]    Special Requests   Final    NONE Performed at [facility], [address], [place], [place] [address]    Culture (A)  Final    >=100,000 COLONIES/mL KLEBSIELLA PNEUMONIAE SUSCEPTIBILITIES TO FOLLOW Performed at [facility], [address] [place], [place] [address]    Report Status PENDING  Incomplete     Radiology Studies: DG Abd 1 View Result Date: 04/18/2024 CLINICAL DATA:  Upper gastrointestinal bleeding. EXAM: ABDOMEN - 1 VIEW COMPARISON:  October 26, 2022. FINDINGS: Mildly dilated small bowel loops are noted which may represent ileus. No colonic dilatation is noted. Probable phleboliths are noted in the pelvis. IMPRESSION: Mildly dilated small bowel loops are noted which may represent ileus. Electronically Signed   By: Lynwood Landy Raddle M.D.   On: 04/18/2024 16:00    Scheduled Meds:  sodium chloride    Intravenous Once   pantoprazole  (PROTONIX ) IV  40 mg Intravenous Q12H   sucralfate   1 g Oral TID WC & HS   Continuous Infusions:  lactated ringers  40 mL/hr at 04/18/24 2059     LOS: 1 day   Norval Bar, MD  [facility]  04/19/2024, 3:58 PM   "

## 2024-04-19 NOTE — Evaluation (Signed)
 Occupational Therapy Evaluation Patient Details Name: Marilyn Haas MRN: [id number] DOB: [birth year] Today's Date: 04/19/2024   History of Present Illness   HPI: Marilyn Haas is a 84 y.o. female with medical history significant of breast cancer on tamoxifen , hypertension, history of chronic blood loss anemia due to upper GI bleed from duodenal ulcer, chronic anxiety disorder, bilateral sensorineural hearing loss, chronic hyponatremia presented from [facility] for hemoglobin of 5.0.  Patient is a poor historian, able to tell that she has dark stools. Denies abdominal pain, nausea. No fever, chills. During my exam she is mostly sleepy but arousable.  Most of the history is obtained from ED, nursing home records. Last admit 04/03/24 for altered mentation, low na.     In the emergency department, patient is noted to hemoglobin of 5.2, patient is started on Rocephin , PPI, IV fluid bolus, GI consulted who advised inpatient admission for EGD evaluation tomorrow.  TRH consulted for inpatient admission further management accordingly.     Clinical Impressions Pt agreeable to OT/PT evaluation. Pt coming from SNF where she was in short term rehab- required assist for ADLs/IADLs from staff. Pt completign bed mobility with min assist, increased time and slow labored movement. Pt required mod assist for STS t/f (multiple repetitions) and RW. Max assist for peri care while PT providing min/mod assist to maintain balance in standin with RW. Mod assist to t/f to chair with use of RW. Pt able to reach down and touch feet and pull up sock after OT donning sock half way. She was able to wash face seated in chair with set up. VC and tactile cueing required throughout session. Pt would continue to benefit from continued OT services.      If plan is discharge home, recommend the following:   A lot of help with walking and/or transfers;A lot of help with bathing/dressing/bathroom;Assistance with  cooking/housework;Direct supervision/assist for medications management;Assist for transportation;Help with stairs or ramp for entrance     Functional Status Assessment   Patient has had a recent decline in their functional status and demonstrates the ability to make significant improvements in function in a reasonable and predictable amount of time.     Equipment Recommendations   None recommended by OT      Precautions/Restrictions   Precautions Precautions: Fall Recall of Precautions/Restrictions: Intact Restrictions Weight Bearing Restrictions Per Provider Order: No     Mobility Bed Mobility Overal bed mobility: Needs Assistance Bed Mobility: Supine to Sit, Sit to Supine     Supine to sit: Min assist Sit to supine: Min assist   General bed mobility comments: min assist, increased time with slow labored movement    Transfers Overall transfer level: Needs assistance Equipment used: Rolling walker (2 wheels) Transfers: Sit to/from Stand Sit to Stand: Mod assist           General transfer comment: mod assist STS t/f, slow labored movement, assist with balance      Balance Overall balance assessment: Needs assistance Sitting-balance support: Feet supported, Bilateral upper extremity supported Sitting balance-Leahy Scale: Good Sitting balance - Comments: seated at EOB   Standing balance support: Reliant on assistive device for balance, During functional activity Standing balance-Leahy Scale: Fair Standing balance comment: using RW                           ADL either performed or assessed with clinical judgement   ADL Overall ADL's : Needs assistance/impaired Eating/Feeding: Set up;Sitting  Grooming: Set up;Sitting Grooming Details (indicate cue type and reason): washed face while seated in chair Upper Body Bathing: Sitting;Minimal assistance;Moderate assistance   Lower Body Bathing: Moderate assistance;Sitting/lateral leans   Upper  Body Dressing : Minimal assistance;Moderate assistance;Sitting   Lower Body Dressing: Sit to/from stand;Moderate assistance;Maximal assistance Lower Body Dressing Details (indicate cue type and reason): pt donning socks while seated in chair with mod assist Toilet Transfer: Moderate assistance;Stand-pivot   Toileting- Clothing Manipulation and Hygiene: Maximal assistance;Sit to/from stand;Sitting/lateral lean Toileting - Clothing Manipulation Details (indicate cue type and reason): Max A for pericare Tub/ Shower Transfer: BSC/3in1;Moderate assistance;Rolling walker (2 wheels)   Functional mobility during ADLs: Moderate assistance;Rolling walker (2 wheels)       Vision Baseline Vision/History: 1 Wears glasses Ability to See in Adequate Light: 1 Impaired Patient Visual Report: No change from baseline Vision Assessment?: Wears glasses for reading     Perception Perception: Not tested       Praxis Praxis: Not tested       Pertinent Vitals/Pain Pain Assessment Pain Assessment: No/denies pain     Extremity/Trunk Assessment Upper Extremity Assessment Upper Extremity Assessment: Generalized weakness   Lower Extremity Assessment Lower Extremity Assessment: Defer to PT evaluation   Cervical / Trunk Assessment Cervical / Trunk Assessment: Kyphotic   Communication Communication Communication: No apparent difficulties Factors Affecting Communication: Hearing impaired   Cognition Arousal: Alert Behavior During Therapy: WFL for tasks assessed/performed Cognition: History of cognitive impairments, Cognition impaired     Awareness: Intellectual awareness impaired Memory impairment (select all impairments): Short-term memory Attention impairment (select first level of impairment): Selective attention Executive functioning impairment (select all impairments): Problem solving OT - Cognition Comments: Pt following commands, slow to respond, at times confused                  Following commands: Intact       Cueing  General Comments   Cueing Techniques: Verbal cues;Tactile cues;Gestural cues              Home Living Family/patient expects to be discharged to:: Skilled nursing facility                                        Prior Functioning/Environment Prior Level of Function : Needs assist       Physical Assist : ADLs (physical);Mobility (physical) Mobility (physical): Transfers;Gait;Bed mobility ADLs (physical): IADLs;Dressing;Bathing Mobility Comments: RW for mobility at facility ADLs Comments: mod assist bathing and dressin at facility    OT Problem List: Decreased strength;Decreased activity tolerance;Impaired balance (sitting and/or standing);Decreased cognition;Decreased safety awareness   OT Treatment/Interventions: Self-care/ADL training;Therapeutic exercise;DME and/or AE instruction;Therapeutic activities;Patient/family education;Balance training;Cognitive remediation/compensation      OT Goals(Current goals can be found in the care plan section)   Acute Rehab OT Goals Patient Stated Goal: to return to rehab OT Goal Formulation: Patient unable to participate in goal setting Time For Goal Achievement: 05/03/24 Potential to Achieve Goals: Fair ADL Goals Pt Will Perform Upper Body Bathing: with set-up;sitting Pt Will Perform Lower Body Dressing: with set-up;sit to/from stand Pt Will Transfer to Toilet: with modified independence;bedside commode Pt Will Perform Toileting - Clothing Manipulation and hygiene: with min assist;sit to/from stand Pt/caregiver will Perform Home Exercise Program: Increased strength;Both right and left upper extremity;With written HEP provided   OT Frequency:  Min 2X/week    Co-evaluation PT/OT/SLP Co-Evaluation/Treatment: Yes Reason for Co-Treatment: To address  functional/ADL transfers   OT goals addressed during session: ADL's and self-care      AM-PAC OT 6 Clicks Daily  Activity     Outcome Measure Help from another person eating meals?: A Little Help from another person taking care of personal grooming?: A Little Help from another person toileting, which includes using toliet, bedpan, or urinal?: A Lot Help from another person bathing (including washing, rinsing, drying)?: A Lot Help from another person to put on and taking off regular upper body clothing?: A Little Help from another person to put on and taking off regular lower body clothing?: A Lot 6 Click Score: 15   End of Session Equipment Utilized During Treatment: Rolling walker (2 wheels);Gait belt  Activity Tolerance: Patient tolerated treatment well Patient left: in bed;with call bell/phone within reach;with bed alarm set  OT Visit Diagnosis: Unsteadiness on feet (R26.81);Other abnormalities of gait and mobility (R26.89);Muscle weakness (generalized) (M62.81);History of falling (Z91.81);Other symptoms and signs involving cognitive function                Time: 8573-8548 OT Time Calculation (min): 25 min Charges:  OT General Charges $OT Visit: 1 Visit OT Evaluation $OT Eval Low Complexity: 1 Low    Chiquita LOISE Sermon, OTR/L  04/19/2024, 3:48 PM

## 2024-04-19 NOTE — Plan of Care (Signed)
" °  Problem: Acute Rehab OT Goals (only OT should resolve) Goal: Pt. Will Perform Upper Body Bathing Flowsheets (Taken 04/19/2024 1546) Pt Will Perform Upper Body Bathing:  with set-up  sitting Goal: Pt. Will Perform Lower Body Dressing Flowsheets (Taken 04/19/2024 1546) Pt Will Perform Lower Body Dressing:  with set-up  sit to/from stand Goal: Pt. Will Transfer To Toilet Flowsheets (Taken 04/19/2024 1546) Pt Will Transfer to Toilet:  with modified independence  bedside commode Goal: Pt. Will Perform Toileting-Clothing Manipulation Flowsheets (Taken 04/19/2024 1546) Pt Will Perform Toileting - Clothing Manipulation and hygiene:  with min assist  sit to/from stand Goal: Pt/Caregiver Will Perform Home Exercise Program Flowsheets (Taken 04/19/2024 1546) Pt/caregiver will Perform Home Exercise Program:  Increased strength  Both right and left upper extremity  With written HEP provided  Chiquita Sermon, OTR/L  "

## 2024-04-19 NOTE — Op Note (Signed)
 [facility] Patient Name: Marilyn Haas Procedure Date: 04/19/2024 8:47 AM MRN: [id number] Date of Birth: Sep 18, 1940 Attending MD: Lamar Ozell Hollingshead , MD, [id number] CSN: [id number] Age: 84 Admit Type: Outpatient Procedure:                Upper GI endoscopy Indications:              Dysphagia, Melena Providers:                Lamar Ozell Hollingshead, MD, Jon LABOR. Gerome RN, RN,                            Bascom Blush Referring MD:              Medicines:                Propofol  per Anesthesia Complications:            No immediate complications. Estimated Blood Loss:     Estimated blood loss was minimal. Procedure:                After obtaining informed consent, the endoscope was                            passed under direct vision. Throughout the                            procedure, the patient's blood pressure, pulse, and                            oxygen saturations were monitored continuously. The                            [id number] ([id number]) Upper was introduced through                            the mouth, and advanced to the second part of                            duodenum. Scope In: 9:16:42 AM Scope Out: 9:29:22 AM Total Procedure Duration: 0 hours 12 minutes 40 seconds  Findings:      One benign-appearing, intrinsic moderate stenosis was found. No       esophagitis. No tumor. Mild impediment to passage of the adult scope but       made it through without much resistance. 10 cm hiatal hernia. No Cameron       lesions. Scattered gastric erosions, however. No ulcer or infiltrative       process seen in the stomach. Patent pylorus. Examination of bulb and       second portion revealed a 10 mm clean-based duodenal ulcer (Forrest 3).       The remainder of the duodenum through the second portion appeared normal       this lesion required no treatment. Scope was withdrawn. A 52 French       Maloney dilators passed up to the full insertion with mild resistance. A        look back the stenosis at the GE junction was nicely dilated without       apparent complication. Finally,  biopsies of the gastric antrum body and       fundus were taken to reassess for H. pylori. Impression:               - Benign-appearing esophageal stenosis. Status post                            Agapito dilation                           -10 cm hiatal hernia                           -Clean-based duodenal bulbar ulcer as described                           -Gastric biopsies for H. pylori taken. Moderate Sedation:      Moderate (conscious) sedation was personally administered by an       anesthesia professional. The following parameters were monitored: oxygen       saturation, heart rate, blood pressure, respiratory rate, EKG, adequacy       of pulmonary ventilation, and response to care. Recommendation:           - Return patient to hospital ward for ongoing care.                           - Full liquid diet. Continue twice daily PPI. Add                            Carafate  1 g slurry's AC and at bedtime. Follow-up                            on pathology. If no H. pylori will consider other                            etiologies of non-NSAID non-H. pylori peptic ulcer                            disease.                           -I went out and spoke to patient's son [name]                            in the consultation room showed him photographs of                            today's endoscopic findings. Went over my                            assessment and recommendations at length. His                            questions were answered. Procedure Code(s):        --- Professional ---  56764, Esophagogastroduodenoscopy, flexible,                            transoral; diagnostic, including collection of                            specimen(s) by brushing or washing, when performed                            (separate procedure) Diagnosis Code(s):         --- Professional ---                           K22.2, Esophageal obstruction                           R13.10, Dysphagia, unspecified                           K92.1, Melena (includes Hematochezia) CPT copyright 2022 [employer] Association. All rights reserved. The codes documented in this report are preliminary and upon coder review may  be revised to meet current compliance requirements. Lamar HERO. Adriell Polansky, MD Lamar Ozell Hollingshead, MD 04/19/2024 10:09:48 AM This report has been signed electronically. Number of Addenda: 0

## 2024-04-19 NOTE — Transfer of Care (Signed)
 Immediate Anesthesia Transfer of Care Note  Patient: Marilyn  H Haas  Procedure(s) Performed: EGD (ESOPHAGOGASTRODUODENOSCOPY)  Patient Location: PACU  Anesthesia Type:MAC  Level of Consciousness: drowsy, patient cooperative, and responds to stimulation  Airway & Oxygen Therapy: Patient Spontanous Breathing and Patient connected to nasal cannula oxygen  Post-op Assessment: Report given to RN and Post -op Vital signs reviewed and stable  Post vital signs: Reviewed and stable  Last Vitals:  Vitals Value Taken Time  BP 112/56 04/19/24 09:32  Temp 37.3 C 04/19/24 09:31  Pulse 95 04/19/24 09:33  Resp 27 04/19/24 09:33  SpO2 99 % 04/19/24 09:33  Vitals shown include unfiled device data.  Last Pain:  Vitals:   04/19/24 0908  TempSrc:   PainSc: 5          Complications: No notable events documented.

## 2024-04-19 NOTE — TOC Initial Note (Signed)
 Transition of Care [facility]) - Initial/Assessment Note    Patient Details  Name: Marilyn Haas  NASRA COUNCE MRN: [id number] Date of Birth: [birth year]  Transition of Care [facility]) CM/SW Contact:    Lucie Lunger, LCSWA Phone Number: 04/19/2024, 10:42 AM  Clinical Narrative:                 CSW notes pt is high risk for readmission. CSW met with pt and son Josue at bedside to complete assessment. Pts son states pt has been at [facility] for SNF and plans are for her to return once medically stable. CSW to send pts info in HUB to facility. TOC to follow.   Expected Discharge Plan: Skilled Nursing Facility Barriers to Discharge: Continued Medical Work up   Patient Goals and CMS Choice Patient states their goals for this hospitalization and ongoing recovery are:: return to SNF at [facility] CMS Medicare.gov Compare Post Acute Care list provided to:: Patient Represenative (must comment) Choice offered to / list presented to : Adult Children [place] ownership interest in [facility].provided to:: Adult Children    Expected Discharge Plan and Services In-house Referral: Clinical Social Work Discharge Planning Services: CM Consult Post Acute Care Choice: Skilled Nursing Facility Living arrangements for the past 2 months: Single Family Home                                      Prior Living Arrangements/Services Living arrangements for the past 2 months: Single Family Home Lives with:: Adult Children Patient language and need for interpreter reviewed:: Yes Do you feel safe going back to the place where you live?: Yes      Need for Family Participation in Patient Care: Yes (Comment) Care giver support system in place?: Yes (comment) Current home services: DME Criminal Activity/Legal Involvement Pertinent to Current Situation/Hospitalization: No - Comment as needed  Activities of Daily Living   ADL Screening (condition at time of admission) Independently performs ADLs?: No Does  the patient have a NEW difficulty with bathing/dressing/toileting/self-feeding that is expected to last >3 days?: Yes (Initiates electronic notice to provider for possible OT consult) Does the patient have a NEW difficulty with getting in/out of bed, walking, or climbing stairs that is expected to last >3 days?: Yes (Initiates electronic notice to provider for possible PT consult) Does the patient have a NEW difficulty with communication that is expected to last >3 days?: Yes (Initiates electronic notice to provider for possible SLP consult) Is the patient deaf or have difficulty hearing?: Yes Does the patient have difficulty seeing, even when wearing glasses/contacts?: Yes Does the patient have difficulty concentrating, remembering, or making decisions?: Yes  Permission Sought/Granted                  Emotional Assessment Appearance:: Appears stated age Attitude/Demeanor/Rapport: Unable to Assess Affect (typically observed): Unable to Assess Orientation: : Oriented to Self Alcohol / Substance Use: Not Applicable Psych Involvement: No (comment)  Admission diagnosis:  Gastrointestinal hemorrhage with melena [K92.1] Symptomatic anemia [D64.9] Patient Active Problem List   Diagnosis Date Noted   Symptomatic anemia 04/18/2024   Acute metabolic encephalopathy 04/18/2024   Duodenal ulcer 12/08/2022   Acute upper GI bleeding 12/08/2022   Hyponatremia 12/07/2022   Hypokalemia 12/07/2022   GERD without esophagitis 12/07/2022   Anxiety and depression 12/07/2022   History of right breast cancer 12/07/2022   Confusion 12/07/2022   Alcohol abuse  12/07/2022   Pressure injury of skin 12/07/2022   Frequent falls 02/14/2022   Upper GI bleed 02/06/2022   Elevated BUN 02/06/2022   GERD (gastroesophageal reflux disease) 07/05/2021   Fall at home, initial encounter 07/04/2021   MDD (major depressive disorder), recurrent episode, moderate (HCC) 06/13/2018   Dysphagia 09/01/2017   Primary  cancer of upper outer quadrant of right breast (HCC) 08/13/2016   Malignant neoplasm of upper-outer quadrant of right breast in female, estrogen receptor positive (HCC) 06/22/2016   Osteopenia determined by x-ray 06/22/2016   Chronic hyponatremia 10/12/2015   PVC's (premature ventricular contractions) 12/16/2014   ABLA (acute blood loss anemia) 08/09/2012   B12 deficiency anemia 08/09/2012   Vitamin D deficiency 08/09/2012   Anxiety 08/09/2012   Hyperlipidemia 05/25/2011   Essential hypertension 05/25/2011   PCP:  Orpha Yancey LABOR, MD Pharmacy:   [facility] [phone] - [place], [place] - [address] AT [facility] [address] [place] [address] Phone: [phone] Fax: [phone]  [facility] - [place], [place] - [address] [place] [address] Phone: [phone] Fax: [phone]     Social Drivers of Health (SDOH) Social History: SDOH Screenings   Food Insecurity: Patient Unable To Answer (04/18/2024)  Housing: Unknown (04/18/2024)  Transportation Needs: Patient Unable To Answer (04/18/2024)  Utilities: Not At Risk (04/18/2024)  Alcohol Screen: Low Risk (03/23/2022)  Depression (PHQ2-9): Medium Risk (03/23/2022)  Financial Resource Strain: Low Risk (10/26/2022)  Physical Activity: Inactive (03/23/2022)  Social Connections: Patient Unable To Answer (04/18/2024)  Stress: No Stress Concern Present (03/23/2022)  Tobacco Use: Medium Risk (04/18/2024)   SDOH Interventions:     Readmission Risk Interventions    04/19/2024   10:40 AM 04/04/2024   11:53 AM 12/07/2022   10:54 AM  Readmission Risk Prevention Plan  Transportation Screening Complete Complete Complete  PCP or Specialist Appt within 3-5 Days   Complete  Home Care Screening  Complete   Medication Review (RN CM)  Complete   HRI or Home Care Consult Complete  Complete  Social Work Consult for Recovery Care Planning/Counseling Complete  Complete  Palliative Care  Screening Not Applicable  Not Applicable  Medication Review Oceanographer) Complete  Not Complete  Med Review Comments   Will be reviewed by charge nurse upon discharge

## 2024-04-20 ENCOUNTER — Other Ambulatory Visit: Payer: Self-pay

## 2024-04-20 ENCOUNTER — Encounter (HOSPITAL_COMMUNITY): Payer: Self-pay | Admitting: Internal Medicine

## 2024-04-20 DIAGNOSIS — D649 Anemia, unspecified: Secondary | ICD-10-CM

## 2024-04-20 DIAGNOSIS — R8279 Other abnormal findings on microbiological examination of urine: Secondary | ICD-10-CM

## 2024-04-20 DIAGNOSIS — K269 Duodenal ulcer, unspecified as acute or chronic, without hemorrhage or perforation: Secondary | ICD-10-CM

## 2024-04-20 DIAGNOSIS — K921 Melena: Principal | ICD-10-CM

## 2024-04-20 DIAGNOSIS — K922 Gastrointestinal hemorrhage, unspecified: Secondary | ICD-10-CM

## 2024-04-20 LAB — CBC
HCT: 27.3 % — ABNORMAL LOW (ref 36.0–46.0)
Hemoglobin: 9.2 g/dL — ABNORMAL LOW (ref 12.0–15.0)
MCH: 30.2 pg (ref 26.0–34.0)
MCHC: 33.7 g/dL (ref 30.0–36.0)
MCV: 89.5 fL (ref 80.0–100.0)
Platelets: 410 10*3/uL — ABNORMAL HIGH (ref 150–400)
RBC: 3.05 MIL/uL — ABNORMAL LOW (ref 3.87–5.11)
RDW: 16.1 % — ABNORMAL HIGH (ref 11.5–15.5)
WBC: 3.9 10*3/uL — ABNORMAL LOW (ref 4.0–10.5)
nRBC: 0.5 % — ABNORMAL HIGH (ref 0.0–0.2)

## 2024-04-20 LAB — URINE CULTURE: Culture: >=100000 — AB

## 2024-04-20 LAB — SURGICAL PATHOLOGY

## 2024-04-20 MED ORDER — NYSTATIN 100000 UNIT/GM EX POWD: Freq: Three times a day (TID) | CUTANEOUS | Status: AC

## 2024-04-20 MED ORDER — NYSTATIN 100000 UNIT/GM EX POWD: Freq: Three times a day (TID) | CUTANEOUS | Status: DC

## 2024-04-20 MED ORDER — SUCRALFATE 1 GM/10ML PO SUSP: 1.0000 g | Freq: Three times a day (TID) | ORAL | Status: AC

## 2024-04-20 NOTE — Anesthesia Postprocedure Evaluation (Signed)
"   Anesthesia Post Note  Patient: Marilyn Haas  Procedure(s) Performed: EGD (ESOPHAGOGASTRODUODENOSCOPY)  Patient location during evaluation: Phase II Anesthesia Type: MAC Level of consciousness: awake Pain management: pain level controlled Vital Signs Assessment: post-procedure vital signs reviewed and stable Respiratory status: spontaneous breathing and respiratory function stable Cardiovascular status: blood pressure returned to baseline and stable Postop Assessment: no headache and no apparent nausea or vomiting Anesthetic complications: no Comments: Late entry   No notable events documented.   Last Vitals:  Vitals:   04/19/24 2007 04/20/24 1434  BP: 111/70 123/75  Pulse: 87 97  Resp: 18   Temp: 37.4 C 37.6 C  SpO2: 95% 95%    Last Pain:  Vitals:   04/20/24 1434  TempSrc: Oral  PainSc:                  Yvonna PARAS Chelcea Zahn      "

## 2024-04-20 NOTE — Discharge Summary (Addendum)
 " [facility] Physician Discharge Summary   Patient name: Marilyn Haas  Admit date:     04/18/2024  Discharge date: 04/20/2024  Attending Physician: DARCI PORE [[id number]]  Discharge Physician: Norval Bar   PCP: Orpha Yancey LABOR, MD  Admitted From: SNF [facility]   Disposition:  [facility] SNF  Recommendations for Outpatient Follow-up:  Follow up with PCP in 1-2 weeks Follow up CBC in one week  Home Health:No Equipment/Devices: @ECDMELIST @  Discharge Condition:Stable CODE STATUS:DNR/DNI Diet recommendation: Dysphagia 2 Fluid Restriction: None  Hospital Summary:  84 y.o. 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  Patient is a poor historian, able to tell that she has dark stools. Denies abdominal pain, nausea. No fever, chills. During my exam she is mostly sleepy but arousable.  Most of the history is obtained from ED, nursing home records. Last admit 04/03/24 for altered mentation, low Na. In the emergency department, patient is noted to hemoglobin of 5.2, patient is started on Rocephin , PPI, IV fluid bolus, GI consulted who advised inpatient admission for EGD evaluation. Admitted for acute on chronic anemia due to acute GI bleed.   Received 2 pRBC transfusions. EGD showed no active bleeding, esophageal stenosis status post Maloney dilation, 10cm hiatal hernia, clean-based duodenal bulbar ulcer. Gastric biopsies negative for H. pylori.  - Continue on oral pantoprazole  40mg  BID - started on sucralfate  AC HS for 2 weeks - avoid NSAIDs - monitor CBC ordered to be done outpatient in one week   Discharge Diagnoses:  Principal Problem:   Symptomatic anemia Active Problems:   Hyponatremia   ABLA  (acute blood loss anemia)   Upper GI bleed   Acute metabolic encephalopathy   Discharge Instructions  Discharge Instructions     Increase activity slowly   Complete by: As directed    No wound care   Complete by: As directed       Allergies as of 04/20/2024       Reactions   Avelox [moxifloxacin Hcl In Nacl] Other (See Comments)   Altered mental status        Medication List     STOP taking these medications    HYDROcodone -acetaminophen  7.5-325 MG tablet Commonly known as: NORCO   venlafaxine  37.5 MG tablet Commonly known as: EFFEXOR        TAKE these medications    acetaminophen  325 MG tablet Commonly known as: TYLENOL  Take 1 tablet (325 mg total) by mouth every 6 (six) hours as needed.   ARIPiprazole  5 MG tablet Commonly known as: ABILIFY  Take 5 mg by mouth daily.   citalopram  20 MG tablet Commonly known as: CELEXA  Take 20 mg by mouth daily.   feeding supplement Liqd Take 237 mLs by mouth 2 (two) times daily between meals.   guaiFENesin  100 MG/5ML liquid Commonly known as: ROBITUSSIN Take 5 mLs by mouth every 4 (four) hours as needed for cough or to loosen phlegm.   ipratropium-albuterol  0.5-2.5 (3) MG/3ML Soln Commonly known as: DUONEB Take 3 mLs by nebulization every 6 (six) hours as needed.   melatonin 3 MG Tabs tablet Take 2 tablets (6 mg total) by mouth at bedtime as needed (sleep).   nystatin  powder Commonly known as: MYCOSTATIN /NYSTOP  Apply topically 3 (three) times daily.   pantoprazole  40 MG tablet Commonly known  as: PROTONIX  TAKE ONE TABLET (40MG  TOTAL) BY MOUTH TWO TIMES DAILY   polyethylene glycol 17 g packet Commonly known as: MIRALAX  / GLYCOLAX  Take 17 g by mouth daily as needed for mild constipation.   sodium chloride  1 g tablet Take 1 tablet (1 g total) by mouth 2 (two) times daily with a meal.   sucralfate  1 GM/10ML suspension Commonly known as: CARAFATE  Take 10 mLs (1 g total) by mouth 4 (four) times daily -  with  meals and at bedtime.   tamoxifen  10 MG tablet Commonly known as: NOLVADEX  TAKE ONE TABLET (10MG  TOTAL) BY MOUTH EVERY OTHER DAY        Contact information for after-discharge care     Destination     [facility] .   Service: Skilled Nursing Contact information: [address] [place]  [address] [phone]                    Allergies[1]  Discharge Exam: Vitals:   04/19/24 2007 04/20/24 1434  BP: 111/70 123/75  Pulse: 87 97  Resp: 18   Temp: 99.3 F (37.4 C) 99.6 F (37.6 C)  SpO2: 95% 95%    Physical Exam Vitals and nursing note reviewed.  Constitutional:      General: She is not in acute distress.    Appearance: She is ill-appearing.  HENT:     Head: Normocephalic and atraumatic.  Cardiovascular:     Rate and Rhythm: Normal rate and regular rhythm.     Pulses: Normal pulses.     Heart sounds: Normal heart sounds.  Pulmonary:     Effort: Pulmonary effort is normal.     Breath sounds: Normal breath sounds.  Abdominal:     General: Bowel sounds are normal.     Palpations: Abdomen is soft.  Neurological:     Mental Status: She is alert.     The results of significant diagnostics from this hospitalization (including imaging, microbiology, ancillary and laboratory) are listed below for reference.    Microbiology: Recent Results (from the past 240 hours)  Urine Culture     Status: Abnormal   Collection Time: 04/18/24 10:50 AM   Specimen: Urine, Catheterized  Result Value Ref Range Status   Specimen Description   Final    URINE, CATHETERIZED Performed at [facility], [address], [place], [place] [address]    Special Requests   Final    NONE Performed at [facility], [address], [place], [place] [address]    Culture >=100,000 COLONIES/mL KLEBSIELLA PNEUMONIAE (A)  Final   Report Status 04/20/2024 FINAL  Final   Organism ID, Bacteria KLEBSIELLA PNEUMONIAE (A)  Final       Susceptibility   Klebsiella pneumoniae - MIC*    AMPICILLIN >=32 RESISTANT Resistant     CEFAZOLIN  (URINE) Value in next row Sensitive      2 SENSITIVEThis is a modified FDA-approved test that has been validated and its performance characteristics determined by the reporting laboratory.  This laboratory is certified under the Clinical Laboratory Improvement Amendments CLIA as qualified to perform high complexity clinical laboratory testing.    CEFEPIME Value in next row Sensitive      2 SENSITIVEThis is a modified FDA-approved test that has been validated and its performance characteristics determined by the reporting laboratory.  This laboratory is certified under the Clinical Laboratory Improvement Amendments CLIA as qualified to perform high complexity clinical laboratory testing.  ERTAPENEM Value in next row Sensitive      2 SENSITIVEThis is a modified FDA-approved test that has been validated and its performance characteristics determined by the reporting laboratory.  This laboratory is certified under the Clinical Laboratory Improvement Amendments CLIA as qualified to perform high complexity clinical laboratory testing.    CEFTRIAXONE  Value in next row Sensitive      2 SENSITIVEThis is a modified FDA-approved test that has been validated and its performance characteristics determined by the reporting laboratory.  This laboratory is certified under the Clinical Laboratory Improvement Amendments CLIA as qualified to perform high complexity clinical laboratory testing.    CIPROFLOXACIN Value in next row Sensitive      2 SENSITIVEThis is a modified FDA-approved test that has been validated and its performance characteristics determined by the reporting laboratory.  This laboratory is certified under the Clinical Laboratory Improvement Amendments CLIA as qualified to perform high complexity clinical laboratory testing.    GENTAMICIN  Value in next row Sensitive      2 SENSITIVEThis is a modified  FDA-approved test that has been validated and its performance characteristics determined by the reporting laboratory.  This laboratory is certified under the Clinical Laboratory Improvement Amendments CLIA as qualified to perform high complexity clinical laboratory testing.    NITROFURANTOIN Value in next row Resistant      2 SENSITIVEThis is a modified FDA-approved test that has been validated and its performance characteristics determined by the reporting laboratory.  This laboratory is certified under the Clinical Laboratory Improvement Amendments CLIA as qualified to perform high complexity clinical laboratory testing.    TRIMETH /SULFA  Value in next row Sensitive      2 SENSITIVEThis is a modified FDA-approved test that has been validated and its performance characteristics determined by the reporting laboratory.  This laboratory is certified under the Clinical Laboratory Improvement Amendments CLIA as qualified to perform high complexity clinical laboratory testing.    AMPICILLIN/SULBACTAM Value in next row Sensitive      2 SENSITIVEThis is a modified FDA-approved test that has been validated and its performance characteristics determined by the reporting laboratory.  This laboratory is certified under the Clinical Laboratory Improvement Amendments CLIA as qualified to perform high complexity clinical laboratory testing.    PIP/TAZO Value in next row Sensitive      <=4 SENSITIVEThis is a modified FDA-approved test that has been validated and its performance characteristics determined by the reporting laboratory.  This laboratory is certified under the Clinical Laboratory Improvement Amendments CLIA as qualified to perform high complexity clinical laboratory testing.    MEROPENEM Value in next row Sensitive      <=4 SENSITIVEThis is a modified FDA-approved test that has been validated and its performance characteristics determined by the reporting laboratory.  This laboratory is certified under the  Clinical Laboratory Improvement Amendments CLIA as qualified to perform high complexity clinical laboratory testing.    * >=100,000 COLONIES/mL KLEBSIELLA PNEUMONIAE     Labs: ProBNP, BNP (last 5 results) No results for input(s): PROBNP, BNP in the last 8760 hours. Basic Metabolic Panel: Recent Labs  Lab 04/18/24 1040 04/19/24 0410  NA 126* 128*  K 3.6 3.8  CL 93* 96*  CO2 24 20*  GLUCOSE 100* 70  BUN 7* 6*  CREATININE 0.83 0.69  CALCIUM 7.7* 7.6*   Liver Function Tests: Recent Labs  Lab 04/18/24 1040  AST 25  ALT 11  ALKPHOS 73  BILITOT 0.2  PROT 4.8*  ALBUMIN 2.6*   No results for  input(s): LIPASE, AMYLASE in the last 168 hours. No results for input(s): AMMONIA in the last 168 hours. CBC: Recent Labs  Lab 04/18/24 1040 04/18/24 2009 04/19/24 0410 04/19/24 1344 04/20/24 0404  WBC 3.6*  --  3.9* 4.0 3.9*  NEUTROABS 2.0  --   --   --   --   HGB 5.2* 10.2* 9.0* 9.4* 9.2*  HCT 16.1* 31.1* 27.0* 28.9* 27.3*  MCV 97.0  --  89.7 91.2 89.5  PLT 394  --  416* 401* 410*   Cardiac Enzymes: No results for input(s): CKTOTAL, CKMB, CKMBINDEX, TROPONINI, TROPONINIHS in the last 168 hours. BNP: No results for input(s): BNP in the last 168 hours. CBG: No results for input(s): GLUCAP in the last 168 hours. D-Dimer No results for input(s): DDIMER in the last 72 hours. Hgb A1c No results for input(s): HGBA1C in the last 72 hours. Lipid Profile No results for input(s): CHOL, HDL, LDLCALC, TRIG, CHOLHDL, LDLDIRECT in the last 72 hours. Thyroid  function studies No results for input(s): TSH, T4TOTAL, FREET4, T3FREE, THYROIDAB in the last 72 hours.  Invalid input(s): FREET3 Anemia work up No results for input(s): VITAMINB12, FOLATE, FERRITIN, TIBC, IRON, RETICCTPCT in the last 72 hours. Urinalysis    Component Value Date/Time   COLORURINE YELLOW 04/18/2024 1050   APPEARANCEUR CLOUDY (A) 04/18/2024 1050    LABSPEC 1.005 04/18/2024 1050   PHURINE 7.0 04/18/2024 1050   GLUCOSEU NEGATIVE 04/18/2024 1050   HGBUR SMALL (A) 04/18/2024 1050   BILIRUBINUR NEGATIVE 04/18/2024 1050   BILIRUBINUR negative 10/26/2022 0841   KETONESUR NEGATIVE 04/18/2024 1050   PROTEINUR NEGATIVE 04/18/2024 1050   UROBILINOGEN 0.2 10/26/2022 0841   UROBILINOGEN 1.0 06/26/2014 1400   NITRITE POSITIVE (A) 04/18/2024 1050   LEUKOCYTESUR LARGE (A) 04/18/2024 1050   Sepsis Labs Recent Labs  Lab 04/18/24 1040 04/19/24 0410 04/19/24 1344 04/20/24 0404  WBC 3.6* 3.9* 4.0 3.9*    Procedures/Studies: DG Abd 1 View Result Date: 04/18/2024 CLINICAL DATA:  Upper gastrointestinal bleeding. EXAM: ABDOMEN - 1 VIEW COMPARISON:  October 26, 2022. FINDINGS: Mildly dilated small bowel loops are noted which may represent ileus. No colonic dilatation is noted. Probable phleboliths are noted in the pelvis. IMPRESSION: Mildly dilated small bowel loops are noted which may represent ileus. Electronically Signed   By: Lynwood Landy Raddle M.D.   On: 04/18/2024 16:00   DG CHEST PORT 1 VIEW Result Date: 04/10/2024 EXAM: 1 VIEW(S) XRAY OF THE CHEST 04/10/2024 08:55:11 PM COMPARISON: 04/03/2024 CLINICAL HISTORY: Aspiration into airway. FINDINGS: LUNGS AND PLEURA: Low lung volumes. No focal pulmonary opacity. No pleural effusion. No pneumothorax. HEART AND MEDIASTINUM: Aortic atherosclerosis is present. No acute abnormality of the cardiac silhouette. BONES AND SOFT TISSUES: No acute osseous abnormality. Moderate hiatal hernia. Surgical clips in the right chest. IMPRESSION: 1. No acute cardiopulmonary abnormality, without radiographic evidence of aspiration. 2. Moderate hiatal hernia. 3. Low lung volumes. Electronically signed by: Elsie Gravely MD 04/10/2024 08:57 PM EST RP Workstation: [id number]   CT Head Wo Contrast Result Date: 04/03/2024 EXAM: CT HEAD WITHOUT CONTRAST 04/03/2024 08:08:19 PM TECHNIQUE: CT of the head was performed without the  administration of intravenous contrast. Automated exposure control, iterative reconstruction, and/or weight based adjustment of the mA/kV was utilized to reduce the radiation dose to as low as reasonably achievable. COMPARISON: MRI head 12/07/2022. CLINICAL HISTORY: Mental status change, unknown cause. FINDINGS: BRAIN AND VENTRICLES: No acute hemorrhage. No evidence of acute infarct. No hydrocephalus. No extra-axial collection. No mass effect or midline shift.  Periventricular and subcortical white matter scattered low-density changes compatible with chronic microvascular ischemic change. Mild diffuse cerebral volume loss. The CT appearance is consistent with the remote lacunar infarct in the right paramedian pons noted on the prior MRI. ORBITS: No acute abnormality. SINUSES: Air-fluid level in right sphenoid sinus. SOFT TISSUES AND SKULL: No acute soft tissue abnormality. No skull fracture. Atherosclerotic calcifications in skull base large vessels. IMPRESSION: 1. No acute intracranial abnormality. 2. Chronic microvascular ischemic change and mild diffuse cerebral volume loss. 3. 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 4. Air-fluid level in the right sphenoid sinus, which can be seen with acute sinusitis. Electronically signed by: Donnice Mania MD 04/03/2024 08:23 PM EST RP Workstation: [id number]   DG Chest Port 1 View Result Date: 04/03/2024 EXAM: 1 VIEW(S) XRAY OF THE CHEST 04/03/2024 07:01:30 PM COMPARISON: 12/07/2022 CLINICAL HISTORY: AMS (altered mental status) FINDINGS: LUNGS AND PLEURA: No focal pulmonary opacity. No pleural effusion. No pneumothorax. HEART AND MEDIASTINUM: Moderate hiatal hernia. No acute abnormality of the cardiac silhouette. BONES AND SOFT TISSUES: No acute osseous abnormality. IMPRESSION: 1. No acute findings. Electronically signed by: Pinkie Pebbles MD 04/03/2024 07:05 PM EST RP Workstation: [id number]    Time coordinating discharge: 45  mins  SIGNED:  Norval Bar, MD [facility] 04/20/24, 3:03 PM     [1]  Allergies Allergen Reactions   Avelox [Moxifloxacin Hcl In Nacl] Other (See Comments)    Altered mental status   "

## 2024-04-20 NOTE — Progress Notes (Addendum)
 "  Gastroenterology Progress Note   Referring Provider: No ref. provider found Primary Care Physician:  Orpha Yancey LABOR, MD Primary Gastroenterologist:  formerly Dr. Eartha   Patient ID: Marilyn Haas; [id number]; 24-Jun-1940   Subjective:    Alert. Oriented to person/place/time. She is hard of hearing. She denies abdominal pain. States she ate grits. No N/V. She denies BM today. Denies having seen black stools although previous documented by nursing. She reports history of drinking beer but denies daily use.   Objective:   Vital signs in last 24 hours: Temp:  [99.3 F (37.4 C)-100.5 F (38.1 C)] 99.3 F (37.4 C) (02/04 2007) Pulse Rate:  [87-90] 87 (02/04 2007) Resp:  [18] 18 (02/04 2007) BP: (111-142)/(67-70) 111/70 (02/04 2007) SpO2:  [91 %-95 %] 95 % (02/04 2007) Last BM Date : 04/18/24 General:   frail appearing female in NAD. Hard of hearing.    NAD Head:  Normocephalic and atraumatic. Eyes:  Sclera clear, no icterus.   Abdomen:  Soft, nontender and nondistended.   Normal bowel sounds, without guarding, and without rebound.   Extremities:  Without clubbing, deformity or edema. Neurologic:  Alert and  oriented x4;  grossly normal neurologically.  Psych:  Alert and cooperative. Normal mood and affect.  Intake/Output from previous day: 02/04 0701 - 02/05 0700 In: 448.6 [I.V.:448.6] Out: 250 [Urine:250] Intake/Output this shift: No intake/output data recorded.  Lab Results: CBC Recent Labs    04/19/24 0410 04/19/24 1344 04/20/24 0404  WBC 3.9* 4.0 3.9*  HGB 9.0* 9.4* 9.2*  HCT 27.0* 28.9* 27.3*  MCV 89.7 91.2 89.5  PLT 416* 401* 410*   BMET Recent Labs    04/18/24 1040 04/19/24 0410  NA 126* 128*  K 3.6 3.8  CL 93* 96*  CO2 24 20*  GLUCOSE 100* 70  BUN 7* 6*  CREATININE 0.83 0.69  CALCIUM 7.7* 7.6*   LFTs Recent Labs    04/18/24 1040  BILITOT 0.2  ALKPHOS 73  AST 25  ALT 11  PROT 4.8*  ALBUMIN 2.6*   No results for input(s):  LIPASE in the last 72 hours. PT/INR Recent Labs    04/18/24 1040  LABPROT 14.4  INR 1.1         Imaging Studies: DG Abd 1 View Result Date: 04/18/2024 CLINICAL DATA:  Upper gastrointestinal bleeding. EXAM: ABDOMEN - 1 VIEW COMPARISON:  October 26, 2022. FINDINGS: Mildly dilated small bowel loops are noted which may represent ileus. No colonic dilatation is noted. Probable phleboliths are noted in the pelvis. IMPRESSION: Mildly dilated small bowel loops are noted which may represent ileus. Electronically Signed   By: Lynwood Landy Raddle M.D.   On: 04/18/2024 16:00   DG CHEST PORT 1 VIEW Result Date: 04/10/2024 EXAM: 1 VIEW(S) XRAY OF THE CHEST 04/10/2024 08:55:11 PM COMPARISON: 04/03/2024 CLINICAL HISTORY: Aspiration into airway. FINDINGS: LUNGS AND PLEURA: Low lung volumes. No focal pulmonary opacity. No pleural effusion. No pneumothorax. HEART AND MEDIASTINUM: Aortic atherosclerosis is present. No acute abnormality of the cardiac silhouette. BONES AND SOFT TISSUES: No acute osseous abnormality. Moderate hiatal hernia. Surgical clips in the right chest. IMPRESSION: 1. No acute cardiopulmonary abnormality, without radiographic evidence of aspiration. 2. Moderate hiatal hernia. 3. Low lung volumes. Electronically signed by: Elsie Gravely MD 04/10/2024 08:57 PM EST RP Workstation: [id number]   CT Head Wo Contrast Result Date: 04/03/2024 EXAM: CT HEAD WITHOUT CONTRAST 04/03/2024 08:08:19 PM TECHNIQUE: CT of the head was performed without the administration of intravenous contrast. Automated  exposure control, iterative reconstruction, and/or weight based adjustment of the mA/kV was utilized to reduce the radiation dose to as low as reasonably achievable. COMPARISON: MRI head 12/07/2022. CLINICAL HISTORY: Mental status change, unknown cause. FINDINGS: BRAIN AND VENTRICLES: No acute hemorrhage. No evidence of acute infarct. No hydrocephalus. No extra-axial collection. No mass effect or midline shift.  Periventricular and subcortical white matter scattered low-density changes compatible with chronic microvascular ischemic change. Mild diffuse cerebral volume loss. The CT appearance is consistent with the remote lacunar infarct in the right paramedian pons noted on the prior MRI. ORBITS: No acute abnormality. SINUSES: Air-fluid level in right sphenoid sinus. SOFT TISSUES AND SKULL: No acute soft tissue abnormality. No skull fracture. Atherosclerotic calcifications in skull base large vessels. IMPRESSION: 1. No acute intracranial abnormality. 2. Chronic microvascular ischemic change and mild diffuse cerebral volume loss. 3. 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 4. Air-fluid level in the right sphenoid sinus, which can be seen with acute sinusitis. Electronically signed by: Donnice Mania MD 04/03/2024 08:23 PM EST RP Workstation: [id number]   DG Chest Port 1 View Result Date: 04/03/2024 EXAM: 1 VIEW(S) XRAY OF THE CHEST 04/03/2024 07:01:30 PM COMPARISON: 12/07/2022 CLINICAL HISTORY: AMS (altered mental status) FINDINGS: LUNGS AND PLEURA: No focal pulmonary opacity. No pleural effusion. No pneumothorax. HEART AND MEDIASTINUM: Moderate hiatal hernia. No acute abnormality of the cardiac silhouette. BONES AND SOFT TISSUES: No acute osseous abnormality. IMPRESSION: 1. No acute findings. Electronically signed by: Pinkie Pebbles MD 04/03/2024 07:05 PM EST RP Workstation: [id number]  [7 weeks]  Assessment:   Marilyn  H Haas is a 84 y.o. year old female with history of breast cancer, depression, scoliosis, GERD, HTN, anxiety, arthritis, constipation, prior GI bleed secondary to Ole erosions who presented to the ED this admission from SNF (recent placement) for evaluation of low hemoglobin.  GI consulted for further evaluation given anemia and reports of melena.   Anemia, melena: - Presented to the hospital with hemoglobin of 5, baseline appears to be in the 9-10 range.    - Previous reports of melena from SNF, noted by nursing 04/18/24 (last BM documented). Patient denies. - Has history of large Ole ulcers/erosions with history of large hiatal hernia in the past requiring significant intervention for treatment - Per review of her medication list she has been taking pantoprazole  40 mg twice daily outpatient.   - Denies any abdominal pain and abdominal exam was benign.  - She denies ASA powders, NSAIDs. Reports use of tylenol . - Chronic daily etoh intake prior to SNF admission last week. - Hgb 5.2, received 2 units prbcs with increase in Hgb to 10.2 (04/18/24) - Hgb stable over past 48 hours, today 9.2. Last BM 48 hours ago.  EGD this admission with benign-appearing esophageal stenosis s/p dilaiton, 10cm hiatal hernia, clean-based duodenal bulbar ulcer (10mm), gastric biopsies taken for H.pylori testing.     Positive urine culture: -Klebsiella pneumoniae, >100,000 CFU -U/A with large leukocytes, positive nitrite -potentially adding to her MS changes -received one dose of Rocephin  in ED 2/3 -discussed with attending, UTI not suspected, holding off antibiotics per attending   Plan:   Continue PPI BID. Carafate  1g slurry's four times daily before meals/bedtime for 2 weeks.  Follow up on pathology.  If no h.pylori, consider other etiologies of non-NSAID/non-H.pylori PUD.  Tried to reach out to son, Rishika Mccollom, per request to update plans for inpatient/outpatient management. Contacted number listed on file, no answer, voicemail full. Will try again later.  LOS: 2 days   Sonny RAMAN. Ezzard RIGGERS [facility] [phone] 2/5/202610:15 AM   Addendum: Lillis a second time to contact son, Sonda Coppens, at 423pm. Voicemail full. Patient discharged back to SNF. Follow up on pathology.  "

## 2024-04-20 NOTE — Care Management Important Message (Signed)
 Important Message  Patient Details  Name: DEJANIQUE RUEHL MRN: [id number] Date of Birth: [birth year]   Important Message Given:  N/A - LOS <3 / Initial given by admissions     Bobby Ragan L Waris Rodger 04/20/2024, 1:37 PM

## 2024-04-20 NOTE — Progress Notes (Signed)
 Report has been called to [facility] LPN at [place] for room A5 bed 2. No qiestions or concerns were brought to my attention by this nurse during report.

## 2024-04-20 NOTE — Discharge Instructions (Signed)
 Take medications as prescribed Repeat blood work in one week Follow up with PCP within one week

## 2024-04-20 NOTE — Plan of Care (Signed)

## 2024-04-20 NOTE — TOC Transition Note (Signed)
 Transition of Care [facility]) - Discharge Note   Patient Details  Name: Marilyn Haas MRN: [id number] Date of Birth: 1940/09/13  [facility] CM/SW Contact:  Lucie Lunger, LCSWA Phone Number: 04/20/2024, 1:52 PM  Clinical Narrative:    CSW updated that pt is medically stable for D/C back to [facility]. CSW spoke to Debbie in admissions who confirms pt can return today. CSW sent D/C clinicals via HUB to facility. CSW left secure VM for son Josue and son Sherida to provide update on plan for D/C, info left for call to be returned. Room and report to be provided to RN. EMS to be called for transport. Med necessity printed to floor for RN. TOC signing off.   Final next level of care: Skilled Nursing Facility Barriers to Discharge: Barriers Resolved   Patient Goals and CMS Choice Patient states their goals for this hospitalization and ongoing recovery are:: return to SNF at [facility] CMS Medicare.gov Compare Post Acute Care list provided to:: Patient Represenative (must comment) Choice offered to / list presented to : Adult Children [place] ownership interest in [facility].provided to:: Adult Children    Discharge Placement                Patient to be transferred to facility by: EMS Name of family member notified: Son Patient and family notified of of transfer: 04/20/24  Discharge Plan and Services Additional resources added to the After Visit Summary for   In-house Referral: Clinical Social Work Discharge Planning Services: CM Consult Post Acute Care Choice: Skilled Nursing Facility                               Social Drivers of Health (SDOH) Interventions SDOH Screenings   Food Insecurity: Patient Unable To Answer (04/18/2024)  Housing: Unknown (04/18/2024)  Transportation Needs: Patient Unable To Answer (04/18/2024)  Utilities: Not At Risk (04/18/2024)  Alcohol Screen: Low Risk (03/23/2022)  Depression (PHQ2-9): Medium Risk (03/23/2022)  Financial  Resource Strain: Low Risk (10/26/2022)  Physical Activity: Inactive (03/23/2022)  Social Connections: Patient Unable To Answer (04/18/2024)  Stress: No Stress Concern Present (03/23/2022)  Tobacco Use: Medium Risk (04/18/2024)     Readmission Risk Interventions    04/20/2024    1:51 PM 04/19/2024   10:40 AM 04/04/2024   11:53 AM  Readmission Risk Prevention Plan  Transportation Screening Complete Complete Complete  Home Care Screening   Complete  Medication Review (RN CM)   Complete  HRI or Home Care Consult Complete Complete   Social Work Consult for Recovery Care Planning/Counseling Complete Complete   Palliative Care Screening Not Applicable Not Applicable   Medication Review Oceanographer) Complete Complete
